# Patient Record
Sex: Female | Born: 1960
Health system: Southern US, Community
[De-identification: ages and names within clinical notes are randomized; demographics above are authoritative.]

## PROBLEM LIST (undated history)

## (undated) DIAGNOSIS — S82009A Unspecified fracture of unspecified patella, initial encounter for closed fracture: Secondary | ICD-10-CM

## (undated) DIAGNOSIS — S329XXA Fracture of unspecified parts of lumbosacral spine and pelvis, initial encounter for closed fracture: Secondary | ICD-10-CM

## (undated) DIAGNOSIS — E785 Hyperlipidemia, unspecified: Secondary | ICD-10-CM

## (undated) DIAGNOSIS — G4734 Idiopathic sleep related nonobstructive alveolar hypoventilation: Secondary | ICD-10-CM

## (undated) DIAGNOSIS — J449 Chronic obstructive pulmonary disease, unspecified: Secondary | ICD-10-CM

## (undated) DIAGNOSIS — I2510 Atherosclerotic heart disease of native coronary artery without angina pectoris: Secondary | ICD-10-CM

## (undated) DIAGNOSIS — R768 Other specified abnormal immunological findings in serum: Secondary | ICD-10-CM

## (undated) DIAGNOSIS — G8929 Other chronic pain: Secondary | ICD-10-CM

## (undated) DIAGNOSIS — R51 Headache: Secondary | ICD-10-CM

## (undated) DIAGNOSIS — E669 Obesity, unspecified: Secondary | ICD-10-CM

## (undated) DIAGNOSIS — K219 Gastro-esophageal reflux disease without esophagitis: Secondary | ICD-10-CM

## (undated) DIAGNOSIS — F1411 Cocaine abuse, in remission: Secondary | ICD-10-CM

## (undated) DIAGNOSIS — R519 Headache, unspecified: Secondary | ICD-10-CM

## (undated) DIAGNOSIS — K449 Diaphragmatic hernia without obstruction or gangrene: Secondary | ICD-10-CM

## (undated) DIAGNOSIS — J9610 Chronic respiratory failure, unspecified whether with hypoxia or hypercapnia: Secondary | ICD-10-CM

## (undated) DIAGNOSIS — F1910 Other psychoactive substance abuse, uncomplicated: Secondary | ICD-10-CM

## (undated) HISTORY — DX: Idiopathic sleep related nonobstructive alveolar hypoventilation: G47.34

## (undated) HISTORY — DX: Cocaine abuse, in remission: F14.11

## (undated) HISTORY — DX: Other specified abnormal immunological findings in serum: R76.8

## (undated) HISTORY — DX: Obesity, unspecified: E66.9

## (undated) HISTORY — DX: Atherosclerotic heart disease of native coronary artery without angina pectoris: I25.10

## (undated) HISTORY — DX: Other chronic pain: G89.29

## (undated) HISTORY — DX: Headache: R51

## (undated) HISTORY — DX: Headache, unspecified: R51.9

## (undated) HISTORY — DX: Hyperlipidemia, unspecified: E78.5

---
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
## 1997-10-10 ENCOUNTER — Other Ambulatory Visit: Admission: RE | Admit: 1997-10-10 | Discharge: 1997-10-10 | Payer: Self-pay | Admitting: Family Medicine

## 1997-12-08 ENCOUNTER — Other Ambulatory Visit: Admission: RE | Admit: 1997-12-08 | Discharge: 1997-12-08 | Payer: Self-pay | Admitting: *Deleted

## 1998-01-09 ENCOUNTER — Ambulatory Visit (HOSPITAL_COMMUNITY): Admission: RE | Admit: 1998-01-09 | Discharge: 1998-01-09 | Payer: Self-pay | Admitting: *Deleted

## 1998-01-13 ENCOUNTER — Ambulatory Visit (HOSPITAL_COMMUNITY): Admission: RE | Admit: 1998-01-13 | Discharge: 1998-01-13 | Payer: Self-pay | Admitting: *Deleted

## 1998-03-10 ENCOUNTER — Inpatient Hospital Stay (HOSPITAL_COMMUNITY): Admission: AD | Admit: 1998-03-10 | Discharge: 1998-03-12 | Payer: Self-pay | Admitting: *Deleted

## 1998-12-15 ENCOUNTER — Emergency Department (HOSPITAL_COMMUNITY): Admission: EM | Admit: 1998-12-15 | Discharge: 1998-12-15 | Payer: Self-pay | Admitting: Emergency Medicine

## 1998-12-15 ENCOUNTER — Encounter: Payer: Self-pay | Admitting: Emergency Medicine

## 1999-06-27 ENCOUNTER — Encounter: Payer: Self-pay | Admitting: Emergency Medicine

## 1999-06-27 ENCOUNTER — Emergency Department (HOSPITAL_COMMUNITY): Admission: EM | Admit: 1999-06-27 | Discharge: 1999-06-27 | Payer: Self-pay | Admitting: Emergency Medicine

## 1999-06-28 ENCOUNTER — Encounter: Payer: Self-pay | Admitting: Emergency Medicine

## 1999-08-30 ENCOUNTER — Inpatient Hospital Stay (HOSPITAL_COMMUNITY): Admission: EM | Admit: 1999-08-30 | Discharge: 1999-09-03 | Payer: Self-pay | Admitting: Emergency Medicine

## 1999-08-30 ENCOUNTER — Encounter: Payer: Self-pay | Admitting: Emergency Medicine

## 1999-09-01 ENCOUNTER — Encounter: Payer: Self-pay | Admitting: Family Medicine

## 2000-08-26 ENCOUNTER — Emergency Department (HOSPITAL_COMMUNITY): Admission: EM | Admit: 2000-08-26 | Discharge: 2000-08-26 | Payer: Self-pay

## 2001-04-04 ENCOUNTER — Ambulatory Visit (HOSPITAL_COMMUNITY): Admission: RE | Admit: 2001-04-04 | Discharge: 2001-04-04 | Payer: Self-pay | Admitting: Family Medicine

## 2001-04-04 ENCOUNTER — Encounter: Payer: Self-pay | Admitting: Family Medicine

## 2001-04-15 ENCOUNTER — Emergency Department (HOSPITAL_COMMUNITY): Admission: EM | Admit: 2001-04-15 | Discharge: 2001-04-15 | Payer: Self-pay

## 2003-11-16 ENCOUNTER — Emergency Department (HOSPITAL_COMMUNITY): Admission: EM | Admit: 2003-11-16 | Discharge: 2003-11-16 | Payer: Self-pay | Admitting: Emergency Medicine

## 2003-11-22 ENCOUNTER — Observation Stay (HOSPITAL_COMMUNITY): Admission: EM | Admit: 2003-11-22 | Discharge: 2003-11-23 | Payer: Self-pay | Admitting: Emergency Medicine

## 2004-01-18 ENCOUNTER — Emergency Department (HOSPITAL_COMMUNITY): Admission: EM | Admit: 2004-01-18 | Discharge: 2004-01-19 | Payer: Self-pay | Admitting: *Deleted

## 2005-12-01 ENCOUNTER — Inpatient Hospital Stay (HOSPITAL_COMMUNITY): Admission: EM | Admit: 2005-12-01 | Discharge: 2005-12-03 | Payer: Self-pay | Admitting: Emergency Medicine

## 2006-01-19 ENCOUNTER — Inpatient Hospital Stay (HOSPITAL_COMMUNITY): Admission: EM | Admit: 2006-01-19 | Discharge: 2006-01-22 | Payer: Self-pay | Admitting: Emergency Medicine

## 2006-05-29 ENCOUNTER — Inpatient Hospital Stay (HOSPITAL_COMMUNITY): Admission: EM | Admit: 2006-05-29 | Discharge: 2006-06-02 | Payer: Self-pay | Admitting: Emergency Medicine

## 2006-08-06 ENCOUNTER — Emergency Department (HOSPITAL_COMMUNITY): Admission: EM | Admit: 2006-08-06 | Discharge: 2006-08-06 | Payer: Self-pay | Admitting: Emergency Medicine

## 2006-08-08 ENCOUNTER — Inpatient Hospital Stay (HOSPITAL_COMMUNITY): Admission: EM | Admit: 2006-08-08 | Discharge: 2006-08-18 | Payer: Self-pay | Admitting: Emergency Medicine

## 2006-08-12 ENCOUNTER — Encounter (INDEPENDENT_AMBULATORY_CARE_PROVIDER_SITE_OTHER): Payer: Self-pay | Admitting: Interventional Cardiology

## 2006-08-18 ENCOUNTER — Encounter: Payer: Self-pay | Admitting: Cardiology

## 2006-08-18 ENCOUNTER — Ambulatory Visit: Payer: Self-pay | Admitting: Cardiology

## 2006-08-20 ENCOUNTER — Inpatient Hospital Stay (HOSPITAL_COMMUNITY): Admission: EM | Admit: 2006-08-20 | Discharge: 2006-08-23 | Payer: Self-pay | Admitting: Emergency Medicine

## 2006-08-20 ENCOUNTER — Ambulatory Visit: Payer: Self-pay | Admitting: Internal Medicine

## 2006-09-06 ENCOUNTER — Ambulatory Visit: Payer: Self-pay | Admitting: Internal Medicine

## 2006-09-11 ENCOUNTER — Ambulatory Visit: Payer: Self-pay

## 2007-06-13 ENCOUNTER — Ambulatory Visit: Payer: Self-pay | Admitting: Cardiology

## 2007-06-21 ENCOUNTER — Ambulatory Visit: Payer: Self-pay

## 2007-07-17 ENCOUNTER — Ambulatory Visit: Payer: Self-pay | Admitting: Internal Medicine

## 2008-03-31 ENCOUNTER — Encounter (HOSPITAL_BASED_OUTPATIENT_CLINIC_OR_DEPARTMENT_OTHER): Payer: Self-pay | Admitting: General Surgery

## 2008-03-31 ENCOUNTER — Ambulatory Visit (HOSPITAL_COMMUNITY): Admission: RE | Admit: 2008-03-31 | Discharge: 2008-03-31 | Payer: Self-pay | Admitting: General Surgery

## 2009-02-13 HISTORY — PX: HERNIA REPAIR: SHX51

## 2009-06-11 ENCOUNTER — Emergency Department (HOSPITAL_COMMUNITY): Admission: EM | Admit: 2009-06-11 | Discharge: 2009-06-11 | Payer: Self-pay | Admitting: Emergency Medicine

## 2009-06-15 ENCOUNTER — Inpatient Hospital Stay (HOSPITAL_COMMUNITY): Admission: EM | Admit: 2009-06-15 | Discharge: 2009-06-18 | Payer: Self-pay | Admitting: Emergency Medicine

## 2009-08-10 ENCOUNTER — Emergency Department (HOSPITAL_COMMUNITY): Admission: EM | Admit: 2009-08-10 | Discharge: 2009-08-10 | Payer: Self-pay | Admitting: Emergency Medicine

## 2009-08-13 ENCOUNTER — Emergency Department (HOSPITAL_COMMUNITY): Admission: EM | Admit: 2009-08-13 | Discharge: 2009-08-13 | Payer: Self-pay | Admitting: Emergency Medicine

## 2009-12-22 ENCOUNTER — Ambulatory Visit (HOSPITAL_COMMUNITY): Admission: RE | Admit: 2009-12-22 | Discharge: 2009-12-22 | Payer: Self-pay | Admitting: Family Medicine

## 2010-07-27 ENCOUNTER — Emergency Department (HOSPITAL_COMMUNITY)
Admission: EM | Admit: 2010-07-27 | Discharge: 2010-07-27 | Disposition: A | Discharge status: Home / Self Care | Payer: Medicare Other | Attending: Emergency Medicine | Admitting: Emergency Medicine

## 2010-07-27 ENCOUNTER — Emergency Department (HOSPITAL_COMMUNITY): Payer: Medicare Other

## 2010-07-27 DIAGNOSIS — J4489 Other specified chronic obstructive pulmonary disease: Secondary | ICD-10-CM | POA: Insufficient documentation

## 2010-07-27 DIAGNOSIS — R062 Wheezing: Secondary | ICD-10-CM | POA: Insufficient documentation

## 2010-07-27 DIAGNOSIS — R0602 Shortness of breath: Secondary | ICD-10-CM | POA: Insufficient documentation

## 2010-07-27 DIAGNOSIS — R109 Unspecified abdominal pain: Secondary | ICD-10-CM | POA: Insufficient documentation

## 2010-07-27 DIAGNOSIS — J449 Chronic obstructive pulmonary disease, unspecified: Secondary | ICD-10-CM | POA: Insufficient documentation

## 2010-07-27 DIAGNOSIS — R059 Cough, unspecified: Secondary | ICD-10-CM | POA: Insufficient documentation

## 2010-07-27 DIAGNOSIS — R05 Cough: Secondary | ICD-10-CM | POA: Insufficient documentation

## 2010-07-27 DIAGNOSIS — R0989 Other specified symptoms and signs involving the circulatory and respiratory systems: Secondary | ICD-10-CM | POA: Insufficient documentation

## 2010-07-27 DIAGNOSIS — R0609 Other forms of dyspnea: Secondary | ICD-10-CM | POA: Insufficient documentation

## 2010-07-27 DIAGNOSIS — K449 Diaphragmatic hernia without obstruction or gangrene: Secondary | ICD-10-CM | POA: Insufficient documentation

## 2010-07-27 LAB — COMPREHENSIVE METABOLIC PANEL
ALT: 14 U/L (ref 0–35)
AST: 19 U/L (ref 0–37)
CO2: 26 mEq/L (ref 19–32)
Chloride: 105 mEq/L (ref 96–112)
Creatinine, Ser: 0.65 mg/dL (ref 0.4–1.2)
GFR calc non Af Amer: >60 mL/min (ref >60)
Glucose, Bld: 123 mg/dL — ABNORMAL HIGH (ref 70–99)
Total Bilirubin: 0.4 mg/dL (ref 0.3–1.2)

## 2010-07-27 LAB — URINALYSIS, ROUTINE W REFLEX MICROSCOPIC
Bilirubin Urine: NEGATIVE
Ketones, ur: NEGATIVE mg/dL
Nitrite: NEGATIVE
Protein, ur: NEGATIVE mg/dL
pH: 5.5 (ref 5.0–8.0)

## 2010-07-27 LAB — POCT PREGNANCY, URINE

## 2010-07-27 LAB — DIFFERENTIAL
Basophils Absolute: 0 10*3/uL (ref 0.0–0.1)
Basophils Relative: 0 % (ref 0–1)
Eosinophils Absolute: 0.1 10*3/uL (ref 0.0–0.7)
Eosinophils Relative: 2 % (ref 0–5)
Monocytes Absolute: 0.6 10*3/uL (ref 0.1–1.0)
Monocytes Relative: 8 % (ref 3–12)

## 2010-07-27 LAB — CBC
Hemoglobin: 11.8 g/dL — ABNORMAL LOW (ref 12.0–15.0)
MCH: 28.2 pg (ref 26.0–34.0)
MCHC: 32.4 g/dL (ref 30.0–36.0)
Platelets: 266 10*3/uL (ref 150–400)
RDW: 13.8 % (ref 11.5–15.5)

## 2010-07-27 LAB — APTT

## 2010-07-27 MED ORDER — IOHEXOL 300 MG/ML  SOLN
100.0000 mL | Freq: Once | INTRAMUSCULAR | Status: AC | PRN
  Administered 2010-07-27: 100 mL via INTRAVENOUS

## 2010-08-01 LAB — BASIC METABOLIC PANEL
BUN: 7 mg/dL (ref 6–23)
Calcium: 8.5 mg/dL (ref 8.4–10.5)
GFR calc non Af Amer: >60 mL/min (ref >60)
Glucose, Bld: 109 mg/dL — ABNORMAL HIGH (ref 70–99)
Sodium: 139 mEq/L (ref 135–145)

## 2010-08-01 LAB — BLOOD GAS, ARTERIAL
Acid-base deficit: 0.9 mmol/L (ref 0.0–2.0)
Drawn by: 129801
FIO2: 0.21 %
pCO2 arterial: 43.8 mmHg (ref 35.0–45.0)
pH, Arterial: 7.359 (ref 7.350–7.400)

## 2010-08-01 LAB — DIFFERENTIAL
Basophils Absolute: 0 10*3/uL (ref 0.0–0.1)
Basophils Relative: 0 % (ref 0–1)
Neutro Abs: 2.8 10*3/uL (ref 1.7–7.7)
Neutrophils Relative %: 74 % (ref 43–77)

## 2010-08-01 LAB — CBC
MCHC: 33.9 g/dL (ref 30.0–36.0)
Platelets: 208 10*3/uL (ref 150–400)
RDW: 13.8 % (ref 11.5–15.5)

## 2010-08-02 LAB — POCT I-STAT 3, ART BLOOD GAS (G3+)
Acid-Base Excess: 4 mmol/L — ABNORMAL HIGH (ref 0.0–2.0)
Bicarbonate: 28.8 mEq/L — ABNORMAL HIGH (ref 20.0–24.0)
pCO2 arterial: 41.8 mmHg (ref 35.0–45.0)
pH, Arterial: 7.446 — ABNORMAL HIGH (ref 7.350–7.400)
pO2, Arterial: 115 mmHg — ABNORMAL HIGH (ref 80.0–100.0)

## 2010-08-02 LAB — CBC
Hemoglobin: 12.4 g/dL (ref 12.0–15.0)
RDW: 13.3 % (ref 11.5–15.5)

## 2010-08-02 LAB — DIFFERENTIAL
Basophils Absolute: 0 10*3/uL (ref 0.0–0.1)
Lymphocytes Relative: 15 % (ref 12–46)
Monocytes Absolute: 0.2 10*3/uL (ref 0.1–1.0)
Neutro Abs: 3.4 10*3/uL (ref 1.7–7.7)

## 2010-08-02 LAB — RAPID URINE DRUG SCREEN, HOSP PERFORMED
Barbiturates: NOT DETECTED
Cocaine: NOT DETECTED
Opiates: NOT DETECTED

## 2010-08-02 LAB — BASIC METABOLIC PANEL
BUN: 10 mg/dL (ref 6–23)
GFR calc non Af Amer: >60 mL/min (ref >60)
Potassium: 2.9 mEq/L — ABNORMAL LOW (ref 3.5–5.1)
Sodium: 139 mEq/L (ref 135–145)

## 2010-08-02 LAB — CREATININE, URINE, RANDOM: Creatinine, Urine: 174.9 mg/dL

## 2010-08-02 LAB — SODIUM, URINE, RANDOM: Sodium, Ur: 95 mEq/L

## 2010-08-06 LAB — MAGNESIUM: Magnesium: 2.3 mg/dL (ref 1.5–2.5)

## 2010-08-06 LAB — COMPREHENSIVE METABOLIC PANEL
ALT: 24 U/L (ref 0–35)
AST: 30 U/L (ref 0–37)
Albumin: 3.5 g/dL (ref 3.5–5.2)
Alkaline Phosphatase: 56 U/L (ref 39–117)
Chloride: 104 mEq/L (ref 96–112)
GFR calc Af Amer: >60 mL/min (ref >60)
Potassium: 4.5 mEq/L (ref 3.5–5.1)
Sodium: 140 mEq/L (ref 135–145)
Total Bilirubin: 0.2 mg/dL — ABNORMAL LOW (ref 0.3–1.2)
Total Protein: 6.6 g/dL (ref 6.0–8.3)

## 2010-08-06 LAB — URINALYSIS, MICROSCOPIC ONLY
Glucose, UA: NEGATIVE mg/dL
Ketones, ur: NEGATIVE mg/dL
Leukocytes, UA: NEGATIVE
Protein, ur: NEGATIVE mg/dL
pH: 6 (ref 5.0–8.0)

## 2010-08-06 LAB — CBC
HCT: 35.3 % — ABNORMAL LOW (ref 36.0–46.0)
Hemoglobin: 12.1 g/dL (ref 12.0–15.0)
MCV: 93.7 fL (ref 78.0–100.0)
Platelets: 227 10*3/uL (ref 150–400)
RBC: 3.78 MIL/uL — ABNORMAL LOW (ref 3.87–5.11)
RBC: 3.93 MIL/uL (ref 3.87–5.11)
WBC: 2.8 10*3/uL — ABNORMAL LOW (ref 4.0–10.5)
WBC: 9.4 10*3/uL (ref 4.0–10.5)

## 2010-08-06 LAB — HEMOGLOBIN A1C: Mean Plasma Glucose: 134 mg/dL

## 2010-08-06 LAB — GLUCOSE, CAPILLARY
Glucose-Capillary: 113 mg/dL — ABNORMAL HIGH (ref 70–99)
Glucose-Capillary: 118 mg/dL — ABNORMAL HIGH (ref 70–99)
Glucose-Capillary: 151 mg/dL — ABNORMAL HIGH (ref 70–99)
Glucose-Capillary: 154 mg/dL — ABNORMAL HIGH (ref 70–99)

## 2010-08-06 LAB — BASIC METABOLIC PANEL
CO2: 29 mEq/L (ref 19–32)
Calcium: 8.6 mg/dL (ref 8.4–10.5)
Creatinine, Ser: 0.74 mg/dL (ref 0.4–1.2)
GFR calc Af Amer: >60 mL/min (ref >60)
GFR calc non Af Amer: >60 mL/min (ref >60)
Sodium: 138 mEq/L (ref 135–145)

## 2010-08-06 LAB — CK: Total CK: 771 U/L — ABNORMAL HIGH (ref 7–177)

## 2010-08-06 LAB — HEPATIC FUNCTION PANEL
Albumin: 3.5 g/dL (ref 3.5–5.2)
Total Protein: 6.6 g/dL (ref 6.0–8.3)

## 2010-08-06 LAB — RAPID URINE DRUG SCREEN, HOSP PERFORMED
Amphetamines: NOT DETECTED
Barbiturates: NOT DETECTED
Benzodiazepines: NOT DETECTED
Opiates: NOT DETECTED

## 2010-08-06 LAB — CK TOTAL AND CKMB (NOT AT ARMC): CK, MB: 3.1 ng/mL (ref 0.3–4.0)

## 2010-08-06 LAB — CARDIAC PANEL(CRET KIN+CKTOT+MB+TROPI): Relative Index: 0.3 (ref 0.0–2.5)

## 2010-08-06 LAB — PHOSPHORUS: Phosphorus: 4.4 mg/dL (ref 2.3–4.6)

## 2010-08-09 LAB — CBC
HCT: 37.2 % (ref 36.0–46.0)
Hemoglobin: 12.7 g/dL (ref 12.0–15.0)
Platelets: 289 10*3/uL (ref 150–400)
RBC: 3.99 MIL/uL (ref 3.87–5.11)
WBC: 6.1 10*3/uL (ref 4.0–10.5)

## 2010-08-09 LAB — DIFFERENTIAL
Eosinophils Relative: 4 % (ref 0–5)
Lymphocytes Relative: 26 % (ref 12–46)
Lymphs Abs: 1.6 10*3/uL (ref 0.7–4.0)
Monocytes Relative: 6 % (ref 3–12)

## 2010-08-09 LAB — URINE MICROSCOPIC-ADD ON

## 2010-08-09 LAB — BASIC METABOLIC PANEL
Chloride: 108 mEq/L (ref 96–112)
GFR calc Af Amer: >60 mL/min (ref >60)
GFR calc non Af Amer: >60 mL/min (ref >60)
Potassium: 3.7 mEq/L (ref 3.5–5.1)
Sodium: 143 mEq/L (ref 135–145)

## 2010-08-09 LAB — POCT I-STAT, CHEM 8
BUN: 7 mg/dL (ref 6–23)
Calcium, Ion: 1.17 mmol/L (ref 1.12–1.32)
Chloride: 105 mEq/L (ref 96–112)
HCT: 41 % (ref 36.0–46.0)
Sodium: 140 mEq/L (ref 135–145)

## 2010-08-09 LAB — URINALYSIS, ROUTINE W REFLEX MICROSCOPIC
Glucose, UA: NEGATIVE mg/dL
Ketones, ur: NEGATIVE mg/dL
Nitrite: NEGATIVE
Protein, ur: NEGATIVE mg/dL
Protein, ur: NEGATIVE mg/dL
Specific Gravity, Urine: 1.019 (ref 1.005–1.030)
Urobilinogen, UA: 1 mg/dL (ref 0.0–1.0)
pH: 6 (ref 5.0–8.0)

## 2010-08-09 LAB — URINE CULTURE
Colony Count: 9000
Culture: NO GROWTH

## 2010-08-09 LAB — D-DIMER, QUANTITATIVE: D-Dimer, Quant: <0.22 ug/mL-FEU (ref 0.00–0.48)

## 2010-09-28 NOTE — Assessment & Plan Note (Signed)
[facility]                            CARDIOLOGY OFFICE NOTE   [name], [name]                      MRN:          [id number]  DATE:06/13/2007                            DOB:          [birth year]    This is a patient of Dr. Gala Romney.  This is a 50 year old African  American female who we had evaluated in the past for chest pain and  shortness of breath but she had refused the stress portion of a  Cardiolite scan.  When he went to reschedule it, she was put in jail for  breaking her probation.  She is now out of jail after spending 8 months  and is here complaining of ongoing shortness of breath and would like  the stress portion of her test reordered.  The patient says while in  jail she continued to have shortness of breath and they gave her three  different inhalers, which she says she is using every 2 hours and is  about to run out.  She denies any chest pain, palpitations, dizziness or  presyncope.  She gets out of breath with very little activity.  2-D echo  in April 2008 showed ejection fraction of 55-60%.  There was an atrial  septal aneurysm and right ventricular size was upper limits of normal  and RV systolic function was mildly reduced.  She does have a history of  cocaine and alcohol abuse as well as cigarette use.  She claims she is  not using any of these drugs and quit smoking.   CURRENT MEDICATIONS:  1. Albuterol and Atrovent inhalers 2 puffs t.i.d.  2. Aspirin 325 mg daily.   PHYSICAL EXAM:  This is a pleasant 50 year old African American female  in no acute distress.  Blood pressure 122/68, pulse 65, weight 169.  NECK:  Without JVD, HJR, bruit or thyroid enlargement.  LUNGS:  Decreased breath sounds, but they are clear anterior, posterior  and lateral.  HEART:  Regular rate and rhythm, 65 beats per minute, normal S1 and S2  with a 1/6 systolic ejection murmur at the apex.  ABDOMEN:  Soft without organomegaly, masses,  lesions or abnormal  tenderness.  EXTREMITIES:  Without cyanosis, clubbing or edema.  She has good distal  pulses.   EKG:  Normal sinus rhythm, nonspecific ST wave changes   IMPRESSION:  1. Dyspnea on exertion felt secondary to chronic obstructive pulmonary      disease, but will order stress portion of the Myoview that was      already done.  2. Chronic obstructive pulmonary disease.  3. Cocaine abuse.  4. Hypertension.  5. Tobacco abuse.  6. Alcohol abuse.  7. History of alcoholic gastritis.  8. Chronic iron-deficiency anemia.   PLAN:  At this time we have ordered the stress portion of the stress  test for this patient and she will follow up Dr. Gala Romney after that.  Please send a carbon copy to Dr. Billee Cashing, and I have asked her  to follow up with him concerning her refill of her inhalers.      [name], PA-C  Electronically Signed      [name]. Jens Som, MD, [facility]  Electronically Signed   ML/MedQ  DD: 06/13/2007  DT: 06/14/2007  Job #: [id number]   cc:   [name], M.D.

## 2010-09-28 NOTE — Op Note (Signed)
[name], [name]             ACCOUNT NO.:  [id number]   MEDICAL RECORD NO.:  [id number]          PATIENT TYPE:  AMB   LOCATION:  DAY                          FACILITY:  [facility]   PHYSICIAN:  Leonie Man, M.D.   DATE OF BIRTH:  12-Apr-1961   DATE OF PROCEDURE:  03/31/2008  DATE OF DISCHARGE:                               OPERATIVE REPORT   PREOPERATIVE DIAGNOSIS:  Incarcerated umbilical hernia   POSTOPERATIVE DIAGNOSIS:  Incarcerated umbilical hernia   PROCEDURE:  Repair of incarcerated umbilical hernia with Proceed mesh.   SURGEON:  Leonie Man, M.D.   ASSISTANT:  Dr. Estelle Grumbles.   ANESTHESIA:  General.   NOTE:  The patient is a 50 year old female with a painful incarcerated  umbilical hernia who referred for evaluation and treatment.  She comes  to the operating room now after the risks and potential benefits of  surgery have been discussed, all questions answered, consent obtained  for surgery.   PROCEDURE:  Following the induction of satisfactory general anesthesia,  the patient was positioned supinely and the abdomen is prepped and  draped to be included in a sterile operative field.  Preoperative  precautions identifying the patient as [name] procedure to be  done as umbilical herniorrhaphy carried out.  I then made an  infraumbilical circumferential incision through the skin and  subcutaneous tissues, dissecting the hernia sac down to the anterior  abdominal wall fascia and again raising the skin flap off of the  umbilical hernia sac down to the fascia.  The umbilical hernia sac is  opened and the incarcerated omentum and falciform ligament are divided  between clamps and secured with ties of 2-0 silk.  All adhesions to the  anterior abdominal wall around the hernia were cleared.  I then placed a  medium Proceed patch within the defect, suturing four sutures in each  corner and then suturing the tails of the mesh to the fascia.  This  having been  accomplished, the redundant tails were removed and the  fascia was closed with interrupted 0 Vicryl sutures.  Sponge and  instrument counts were doubly verified.  Subcutaneous tissues closed  with 3-0 Vicryl sutures.  Skin closed with running 4-0 Monocryl suture,  reinforced with Steri-Strips.  Sterile compressive dressing applied.  The anesthetic reversed and the patient removed from the operating room  to the recovery room in stable condition.  She tolerated the procedure  well.      Leonie Man, M.D.  Electronically Signed     PB/MEDQ  D:  03/31/2008  T:  04/01/2008  Job:  [id number]

## 2010-09-28 NOTE — Assessment & Plan Note (Signed)
[facility]                            CARDIOLOGY OFFICE NOTE   [name], [name]                      MRN:          [id number]  DATE:07/17/2007                            DOB:          04-28-1961    PRIMARY CARE PHYSICIAN:  Dr. Billee Cashing.   INTERVAL HISTORY:  [name] is a very pleasant 50 year old woman whom  evaluated recently for chest pain and shortness of breath.  She  underwent Myoview which showed ejection fraction of 58% with no ischemia  or infarct on her perfusion images.  She was found to have asthma.  She  is been treated with inhalers and is doing much better.  She denies any  recurrent chest pain or shortness of breath.  She quit smoking 8 months  ago.   CURRENT MEDICATIONS:  1. Atrovent 2 puffs 3 times a day.  2. Albuterol 2 puffs 3 times a day.  3. She also uses a Qvar inhaler.  4. Prilosec.   PHYSICAL EXAM:  She is in no acute distress.  Ambulates around the  clinic without respiratory difficulty.  Blood pressure is 115/82, heart rate 63, weights 172.  HEENT is normal.  Neck is supple.  No JVD.  Carotid 2+ bilaterally without bruits.  There  is no lymphadenopathy or thyromegaly.  CARDIAC:  PMI is nondisplaced.  She is regular rate and rhythm.  No  murmurs, rubs or gallops.  Lungs are clear with decreased breath sounds throughout.  No wheezing.  ABDOMEN:  Soft, nontender, nondistended.  No hepatosplenomegaly, no  bruits, no masses.  EXTREMITIES:  Warm with no cyanosis, clubbing or edema.  No rash.  NEURO:  Alert and oriented x3.  Cranial nerves II-XII are intact.  Moves  all four extremities without difficulty.  Affect is pleasant.   EKG shows normal sinus rhythm rate of 63 with no ST-T wave changes.   ASSESSMENT/PLAN:  1. Chest pain, shortness of breath.  I think this is mostly related to      her asthma.  Her Myoview scan has been totally negative.  She is      currently asymptomatic.  Will follow her up on  a p.r.n. basis.  I      did tell her she can stop her aspirin if she would like.     Tami Buckles. Bensimhon, MD  Electronically Signed   DRB/MedQ  DD: 07/17/2007  DT: 07/18/2007  Job #: [id number]   cc:   [name], M.D.

## 2010-10-01 NOTE — Assessment & Plan Note (Signed)
[place] [facility]                            CARDIOLOGY OFFICE NOTE   [name], [name]                      MRN:          [id number]  DATE:09/06/2006                            DOB:          April 05, 1961    This is a 50 year old African American female who was seen in consult in  the hospital for chest pain.  She was admitted with extensive left sided  strep pneumonia with associated bacteremia.  Hospitalization was also  complicated by apparent congestive heart failure.  She had an echo that  showed an ejection fraction of 60%, our view was mildly dilated with  some RV hypokinesis.  CT did not show any pulmonary embolism.  She had  signed AMA and was readmitted with several hour episode of chest pain  while watching television.  Her enzymes were negative, however EKG  showed prominent T-wave conversion v2 through V4 concerning for  ischemia.  It should be noted that the patient is a polysubstance abuser  with ongoing tobacco, alcohol and cocaine use and her levels were  positive for cocaine.  The patient has a history of non-compliance.  She  was scheduled for a stress Myoview, but refused the stress portion of  it.   The patient is here today for a post hospital follow up.  She says she  had an episode of chest pain that felt like indigestion last week while  smoking a cigarette.  She does have stomach problems with alcohol  induced gastritis and it is hard to discern what her chest pain is  coming from.  The patient admits to ongoing cocaine and cigarette abuse,  but says she has not had any alcohol recently.  She did cocaine over the  weekend.   CURRENT MEDICATIONS:  1. Aspirin 325 mg daily.  2. Multivitamin daily.  3. Avelox 400 mg nightly.   PHYSICAL EXAMINATION:  This is a thin anxious 50 year old [place]  American female in no acute distress.  Blood pressure 122/78, pulse 79, weight 121.  NECK:  Without jugular venous distension, HJR,  bruit or thyroid  enlargement.  LUNGS:  Decreased breath sounds, but clear, posteriorly a few scattered  crackles anteriorly.  HEART:  Regular rate and rhythm at 80 beats per minute, normal S1, S2,  positive S4 and a 1/6 systolic murmur at the apex.  ABDOMEN:  Soft without organomegaly, masses, lesions or abnormal  tenderness.  EXTREMITIES:  Without cyanosis, clubbing or edema, she has good distal  pulses.   IMPRESSION:  1. Chest pain with abnormal EKG with T-wave inversion and V2 through      V4.  Refuse stress portion of adenosine Myoview in the hospital.  2. Left sided pneumococcal pneumonia.  3. Cocaine abuse ongoing.  4. Hypertension.  5. Chronic obstructive pulmonary disease.  6. Tobacco abuse.  7. Alcohol abuse.  8. History of alcoholic gastritis.  9. Chronic iron deficiency anemia.  10.Chronic __________  anemia.  11.Scoliosis.  12.Chronic obstructive pulmonary disease.  13.History of malnutrition.   PLAN:  The patient says she will agree to the stress portion of the  stress  test.  She is non-compliant with medications and I have not  resumed any cardiac medications as she continues to abuse drugs and is  not going to take any medication at this time.  I have urged the patient  to quit cocaine and cigarette abuse and we have scheduled her for stress  Myoview and she will follow up with Dr. Bevelyn Buckles. Bensimhon within the  month.  She is to see Dr. Ronne Binning this week.      [name], PA-C  Electronically Signed      Tami Cranker, MD, [facility]  Electronically Signed   ML/MedQ  DD: 09/06/2006  DT: 09/06/2006  Job #: [id number]   cc:   Bevelyn Buckles. Bensimhon, MD  [name], M.D.

## 2010-10-01 NOTE — Consult Note (Signed)
[place]  Patient:    [name], [name]                      MRN: [id number] Proc. Date: 09/01/99 Adm. Date:  [id number] Attending:  Judie Petit Dictator:   [id number] CC:         [name], M.D.                          Consultation Report  REASON FOR CONSULTATION: [name] is a 50 year old female whom I am asked to see for guaiac positive stool and iron deficiency anemia.  HISTORY OF PRESENT ILLNESS: She was admitted on August 30, 1999 with asthma and pneumonia of the right lower lobe and probably also the left lower lobe, with small bilateral effusions.  Both sputum and blood cultures have grown out Streptococcus pneumoniae.  She is currently being treated with Levaquin and has improved significantly since admission.  In addition she has been receiving inhalation therapy.  Her admission hemoglobin was 12.4, which dropped to 9.4 with hydration. Fecal occult blood tests are positive.  Serum iron is less than 10.  B-12 and folate levels are normal.  The patient reports no significant abdominal pain or  weight loss, no vomiting, hematemesis, dysphagia, or heartburn.  She does admit to small amounts of bright red blood per rectum following bowel movements on occasion.  She has never had any radiographic or endoscopic studies of her gastrointestinal tract, and has no prior history of ulcers; however, she does use a fair amount f aspirin and ibuprofen as-needed.  She smokes approximately three or more cigarettes a day and drinks up to six quarts of beer a week, and she has a history of crack cocaine use.  PAST MEDICAL HISTORY: Pertinent for asthma and back disorder that has resulted n disability.  FAMILY HISTORY: Negative for known ulcers, gallstones, inflammatory bowel disease, or gastrointestinal neoplasia.  SOCIAL HISTORY: She is the mother of three, separated from the father of these children and living with a  boyfriend.  As noted, she drinks heavily, usually only beer.  She is a mild smoker and has a history of using crack cocaine.  REVIEW OF SYSTEMS: GENERAL: No weight loss or night sweats, although she recently had fevers with the pneumonia.  ENDOCRINE: It is noted in her chart that her TSH is very low, though she has no known history of hyperthyroidism.  SKIN: No rash or  pruritus.  EYES: No icterus or change in vision.  HEENT: Oropharynx shows no aphthous ulcers or chronic sore throat.  RESPIRATORY: Positive for asthma, shortness of breath, and cough which is productive at present.  CARDIAC: No history of angina or valvular heart disease.  GI: As above.  In addition, the patient reports a history of unknown hepatitis two years ago.  GU: No dysuria or hematuria.  The remainder of the Review Of Systems is negative.  PHYSICAL EXAMINATION:  GENERAL: She is a well-developed, well-nourished adult female, in no acute distress.  VITAL SIGNS: She is currently afebrile, with a blood pressure of 121/82, pulse f 75 and regular.  SKIN: Normal.  HEENT:  Eyes anicteric.  Conjunctivae pink.  Oropharynx unremarkable.  NECK: Supple without thyromegaly or adenopathy.  There is no inguinal adenopathy.  CHEST: Sounds fairly clear on the left side.  There are decreased breath sounds and rales at the right base.  There is no egophony.  CARDIAC:  Heart sounds regular rate and rhythm without murmurs.  ABDOMEN:  Soft and nontender, without mass or organomegaly.  RECTAL: Not performed.  EXTREMITIES: Without edema, dorsalis pedis pulses 2+ bilaterally.  LABORATORY DATA: Potassium 3.2, serum sodium 132.  BUN 11, creatinine 0.7. Hemoglobin 9.4, platelet count 155,000, WBC 3.4.  Serum iron less than 10. B-12 level is 570, folate is 363.  IMPRESSION: This patient is a 50 year old female without significant symptoms of gastrointestinal tract disease other than occasional bright red blood  per rectum after bowel movements.  She has a significant anemia and is iron deficient. This could be related to gastrointestinal blood loss or menstruation.  In light of her alcohol abuse and history of aspirin use, and her age, significant peptic or neoplastic lesion should be ruled out.  I do think at this time that her respiratory status would not contraindicate endoscopic procedures and thus I have suggested to the patient both upper and lower endoscopy to rule out significant  problems.  PLAN: Procedures were reviewed in terms of technique, preparation, and risk of complications including bleeding and perforation, and she agrees to proceed. This will be scheduled for tomorrow afternoon, September 02, 1999.  Clear liquid diet and Phospho-Soda prep will be administered.  Please see the orders. DD:  09/01/99 TD:  09/01/99 Job: [id number]

## 2010-10-01 NOTE — Discharge Summary (Signed)
[name], [name]             ACCOUNT NO.:  [id number]   MEDICAL RECORD NO.:  [id number]          PATIENT TYPE:  INP   LOCATION:  3735                         FACILITY:  [facility]   PHYSICIAN:  Hillery Aldo, M.D.   DATE OF BIRTH:  02-19-1961   DATE OF ADMISSION:  12/01/2005  DATE OF DISCHARGE:  12/03/2005                                 DISCHARGE SUMMARY   PRIMARY CARE PHYSICIAN:  [name], M.D.   DISCHARGE DIAGNOSES:  1.  Acute chronic obstructive pulmonary disease and asthma exacerbation.  2.  Ongoing cocaine abuse.  3.  Ongoing tobacco abuse.  4.  Hypokalemia.  5.  Mild thoracolumbar scoliosis.  6.  Normocytic anemia.  7.  Steroid-induced hyperglycemia.   DISCHARGE MEDICATIONS:  1.  Avelox 400 mg daily x4 additional days.  2.  Prednisone taper 60 mg to off over six days.  3.  Albuterol meter dose inhaler two puffs q.2 h. P.r.n.   CONSULTATIONS:  None.   BRIEF ADMISSION HPI:  The patient is a 50 year old female who presents with  increased shortness of breath accompanied by increased wheezing and a cough  productive of yellow and green sputum.  She was admitted for stabilization  and treatment.   PROCEDURES AND DIAGNOSTIC STUDIES:  Chest x-ray on December 02, 2005 showed no  active cardiopulmonary disease.  There was mild thoracolumbar scoliosis.   DISCHARGE LABORATORY VALUES:  CBC showed a white blood cell count of 11,  hemoglobin 10.4, hematocrit 31.8 and platelet count 354.  BMET showed a  sodium of 240, potassium 4.3, chloride 103, bicarb 32, BUN 9, creatinine 0.7  and glucose 133.   HOSPITAL COURSE BY PROBLEM:  1.  Acute asthma and chronic obstructive pulmonary disease exacerbation.      The patient likely  has had an exacerbation related to crack cocaine      use.  She was treated conservatively with steroids and empiric      antibiotics given her productive cough and elevated white blood cell      count.  We will continue her antibiotics for a full  week course. She has      improved and feels ready to go home. Her lungs are clear without wheezes      but diminished at the bases.  2.  Polysubstance abuse.  The patient did have a urine drug screen done on      admission which showed positive opiates and cocaine.  Social work was      consulted with a referral for [facility] completed and the patient should      follow up as an outpatient.  3.  Hypokalemia.  The patient did have transient hypokalemia.  This      corrected on its own without any additional supplementation.  4.  Steroid-induced hyperglycemia.  The patient was started on sliding scale      insulin.  Her glycemic control should normalize with steroid taper.  5.  Normocytic anemia.  While the patient admits that she gets heavy      periods, her anemia is likely related to menstrual losses.  She should      follow up with her primary care physician for ongoing surveillance of      this issue.  6.  Tobacco abuse.  The patient was  given a nicotine patch and counseled on      the importance of cessation given her underlying lung disease.   DISPOSITION:  The patient is stable for discharge home.  She should follow  up with Dr. Ronne Binning early next week.  She should follow up with [facility] for  ongoing substance abuse counseling.  She is instructed to return to the  emergency department if she experiences any further problems with  respiratory distress.      Hillery Aldo, M.D.  Electronically Signed     CR/MEDQ  D:  12/03/2005  T:  12/03/2005  Job:  [id number]   cc:   [name], M.D.

## 2010-10-01 NOTE — H&P (Signed)
[name] NO.:  [id number]   MEDICAL RECORD NO.:  [id number]          PATIENT TYPE:  EMS   LOCATION:  MAJO                         FACILITY:  [facility]   PHYSICIAN:  [name], M.D. DATE OF BIRTH:  12-Aug-1960   DATE OF ADMISSION:  08/08/2006  DATE OF DISCHARGE:                              HISTORY & PHYSICAL   PRIMARY CARE PHYSICIAN:  [name], M.D. and therefore she is  unassigned.   CHIEF COMPLAINT:  Abdominal discomfort.   HISTORY OF PRESENT ILLNESS:  [name] is a 50 year old female  who is accompanied by her mother.  The patient is awake and coherent,  however, is somewhat distracted by her abdominal discomfort.  Therefore,  it is very difficult at times to get a good history from the patient.  Her mother provides additional information.  According to the patient,  she has been feeling ill for at least 2 weeks.  She went to see her  primary care doctor Thursday or Friday of last week, today is Tuesday  August 08, 2006.  She states that her primary care doctor told her that  she had the flu.  She was provided with a Z-Pak, Vicodin, and Tussionex.  She took the medications, however, her symptoms did not improve.  She  came to the emergency department 2 days later, August 06, 2006.  At that  time the ER doctor provided her with a prescription for Avelox.  She  states that neither the Z-Pak nor the Avelox helped to alleviate her  symptoms.  She woke up this morning and states that her belly button  felt as though it was sticking out.  Her mother was at the bedside and  states that the patient had been up all night coughing.  The patient  herself states that she has been coughing up a large amount of greenish  phlegm.  Her mother states there have been some slight streaks of blood  present with the patient's phlegm.  The patient denies currently having  a fever, but states that last week her temperature went up as high as  103.  She does complain of nausea and emesis, having an episode of  emesis last night.  There have been no sick contacts at home.  She also  has had some slight shortness of breath.   PAST MEDICAL HISTORY:  1. COPD.  2. Alcohol withdrawal.  3. Substance abuse.  4. Mild scoliosis.  5. Normocytic anemia.  6. Steroid induced hyperglycemia.  7. Alcoholic gastritis.  8. Hyperkalemia.  9. History of asthma.  10.Iron deficiency anemia.  11.Pneumonia.   ALLERGIES:  No known drug allergies.   CURRENT MEDICATIONS:  Albuterol inhaler.   SOCIAL HISTORY:  The patient has a history of smoking one pack of  cigarettes per day.  She has been smoking for at least 20 years.  She  also has a history of cocaine use and alcohol abuse.   FAMILY HISTORY:  Mother has a history of asthma and rheumatoid  arthritis.  Father has a history of heart problems and was asthmatic.   REVIEW  OF SYSTEMS:  As per HPI, otherwise, all other systems are  negative.   PHYSICAL EXAMINATION:  GENERAL:  The patient is awake.  She is  cooperative, but at times appears to be slightly agitated and  uncomfortable.  There is some slight use of additional accessory muscles  to breathe.  She speaks in full sentences, but appears to be slightly  tachypneic.  She appears also to be somewhat cachectic in her overall  physical appearance.  VITAL SIGNS:  Temperature was 98.0, blood pressure 124/82, heart rate  100, respirations 22, O2 saturation 98% on room air.  HEENT:  Normocephalic and atraumatic.  Anicteric.  Decreased reactivity  to light bilaterally.  Oral mucosa is pink.  All of the patient's upper  teeth are missing.  No thrush or exudates.  NECK:  Supple with no lymphadenopathy.  Thyroid is not palpable.  HEART:  S1 and S2 present.  LUNGS:  Decreased breath sounds bilaterally.  ABDOMEN:  Distended, soft.  There is some slight discomfort to palpation  in all four quadrants.  Bowel sounds are present.  No masses are   palpated.  EXTREMITIES:  No leg edema.  NEUROLOGY:  The patient is alert and oriented x3.  MUSCULOSKELETAL:  5/5 upper and lower extremity strength.   LABORATORY DATA:  ABG; pH 7.430, pCO2 47.1, bicarb 31.3.  White blood  cell count 18.9, hemoglobin 10.7, hematocrit 31.3, platelets 412.  Sodium 127, potassium 3.5, chloride 93, BUN 11, creatinine 0.6, glucose  104, alkaline phosphatase 120, SGOT 38, SGPT 31, total protein 5.9,  albumin 2.3, calcium 8.4, lipase 13.   Chest x-ray reveals extensive left lung pneumonia, stable changes  consistent with COPD.   Abdominal x-ray reveals no acute findings.   ASSESSMENT:  1. Extensive left lung pneumonia.  The patient has failed outpatient      therapy with p.o. Avelox and Azithromycin.  We will start the      patient on aggressive IV antibiotics.  We will obtain a sputum      sample and send for gram stain, culture, and sensitivity as well as      PCP and Legionella.  We will also check blood cultures x2 and      verify the patient's HIV status.  2. History of alcohol abuse.  The patient currently shows no signs of      withdrawal.  We will monitor closely for now.  We will provide      thiamine, folic acid, and multivitamin.  3. Abdominal pain.  The etiology of this is questionable.  The patient      has already had abdominal x-rays completed and they demonstrate no      acute findings.  Her current symptomes could be secondary to      gastritis as well as peptic ulcer disease.  The fact that the      patient has been coughing throughout the course of the night may      have also caused some irritation to her abdomen.  Since the      patient's abdominal x-rays were negative, we will consider ordering      a CAT scan of the patient's abdomen.  4. Leukocytosis.  This is more likely secondary to the patient's      pneumonia.  We will treat as already outlined. 5. Hyponatremia.  The etiology of this is questionable.  Again the      patient  does appear to be cachectic in appearance.  Therefore a      decreased p.o. intake of sodium may have contributed to this.  We      will start the patient on 0.9 normal saline for now and monitor her      levels closely.  6. Hypoalbuminemia.  This may be secondary to protein calorie      malnutrition.  We will check a prealbumin level.      May also consider consultation with nutrition.  7. Gastrointestinal prophylaxis.  We will provide Protonix.  8. Deep venous thrombosis prophylaxis.  We will provide Lovenox.      [name], M.D.  Electronically Signed     OR/MEDQ  D:  08/08/2006  T:  08/08/2006  Job:  [id number]   cc:   [name], M.D.

## 2010-10-01 NOTE — H&P (Signed)
[name], [name]             ACCOUNT NO.:  [id number]   MEDICAL RECORD NO.:  [id number]          PATIENT TYPE:  INP   LOCATION:  1830                         FACILITY:  [facility]   PHYSICIAN:  Hillery Aldo, M.D.   DATE OF BIRTH:  February 25, 1961   DATE OF ADMISSION:  01/19/2006  DATE OF DISCHARGE:                                HISTORY & PHYSICAL   PRIMARY CARE PHYSICIAN:  [name], M.D.   CHIEF COMPLAINT:  Wheezing, dyspnea.   HISTORY OF PRESENT ILLNESS:  Patient is a 50 year old female with a past  medical history of COPD, who presents with a three day history of increasing  cough productive of yellow sputum.  She denies any fever but has had some  intermittent chills.  She does describe chest congestion and hoarseness.  Denies any sick contacts.  She does have a history of cocaine abuse and last  smoked cocaine approximately two days ago.  We are admitting her for further  evaluation, workup, and treatment for her acute COPD exacerbation.   PAST MEDICAL HISTORY:  1. COPD with hospitalizations for acute exacerbation in the past.  2. Polysubstance abuse.  3. Ongoing tobacco abuse.  4. Mild scoliosis.  5. History of normocytic anemia.  6. Steroid-induced hyperglycemia.  7. History of microcytic anemia, status post GI workup, including upper      and lower endoscopy with the only positive finding being gastritis.   FAMILY HISTORY:  The patient's mother has asthma and rheumatoid arthritis.  Father died at age 85 with complications of an acute MI.  He was also  asthmatic.   SOCIAL HISTORY:  Patient is single.  She has a 10-pack-year history of  tobacco abuse.  She does continue to actively use cocaine.  Drinks alcohol  socially.  She has three healthy children.   ALLERGIES:  None.   CURRENT MEDICATIONS:  Albuterol metered dose inhaler p.r.n.   REVIEW OF SYSTEMS:  Negative except for the elements as noted in the HPI  above.   PHYSICAL EXAMINATION:  VITAL SIGNS:   Temperature 98, pulse 78, respirations  20, blood pressure 103/61.  GENERAL:  A well-developed, well-nourished thin female in no acute distress.  HEENT:  Normocephalic and atraumatic.  PERRLA.  EOMI.  Oropharynx is clear.  NECK:  Supple.  No thyromegaly.  No lymphadenopathy.  No jugular venous  distention.  CHEST:  There are diffuse expiratory wheezes in all lung fields.  HEART:  Tachycardic rate.  Regular rhythm.  No murmurs, rubs or gallops.  ABDOMEN:  Soft, nontender, nondistended with normoactive bowel sounds.  EXTREMITIES:  No clubbing, cyanosis or edema.  SKIN:  Warm and dry.  No rashes.  NEUROLOGIC:  Alert and oriented x3.  Cranial nerves II-XII are grossly  intact.  Nonfocal.  PSYCH:  Patient appears to have a depressed affect and is somewhat  tremulous.   DATA REVIEW:  Chest x-ray shows changes of COPD but no active  cardiopulmonary disease.   EKG shows tachycardia with normal sinus rhythm.   LABORATORY DATA:  Sodium is 138, potassium 4.2, chloride 108, bicarb 28, BUN  7, creatinine  0.8, glucose 65.  White blood cell count is 4.5, hemoglobin  12.8, hematocrit 38, platelets 354, absolute neutrophil count 2.   ASSESSMENT/PLAN:  1. Chronic obstructive pulmonary disease with acute exacerbation, likely      triggered by cocaine abuse.  We will admit the patient and administer      high dose Solu-Medrol, empiric IV antibiotics with Avelox, frequent      bronchodilator by nebulized aerosols, and antitussive/mucolytic's.      Will attempt to taper steroids rapidly.  Given her recurrent cocaine      abuse, would get Social Work consult for alcohol and drug counseling,      as this will likely recur again with ongoing use.  Additionally, she      continues to abuse tobacco.  We will get formal counseling for tobacco      abuse as well.  2. Polysubstance abuse:  Again, we will get cessation counseling.  We will      use Ativan p.r.n. for agitation.  3. Prophylaxis:  We will use PAS  hose for DVT prophylaxis, as the patient      is fairly ambulatory.  Will put the patient on p.o. Protonix for GI      prophylaxis.      Hillery Aldo, M.D.  Electronically Signed     CR/MEDQ  D:  01/19/2006  T:  01/19/2006  Job:  [id number]   cc:   [name], M.D.

## 2010-10-01 NOTE — Procedures (Signed)
[place]  Patient:    [name], [name]                      MRN: [id number] Proc. Date: 09/02/99 Adm. Date:  [id number] Attending:  Judie Petit Dictator:   Roosvelt Harps, M.D. CC:         [name], M.D.                           Procedure Report  PROCEDURES: 1. Video upper endoscopy. 2. Video colonoscopy.  INDICATIONS:  The patient is a 50 year old female with microcytic guaiac positive anemia.  PREPARATION:  She is n.p.o. since midnight, having taken Phospho-Soda prep and clear liquid diet.  Mucosa throughout is clean.  PRE-PROCEDURE SEDATION: 1. Prior to the upper endoscopy she received 3.75 mg of Droperidol, 5 mg of    Versed, and 40 mg of Demerol intravenously. 2. In addition, her throat was anesthetized with Hurricane spray, and she was    on 2 L of nasal cannula O2.  DESCRIPTION OF PROCEDURE:  The Olympus video upper endoscope was inserted via the mouth and advanced easily through the upper esophageal sphincter. Intubation was then carried out well into the descending duodenum.  Descending duodenum and bulb appeared normal.  Within the antrum of the stomach were a few small erosions which were not bleeding.  The remainder of the stomach, retroflex view of the GE junction were normal.  The esophagus was entirely normal.  There were no signs of varices.  The patient tolerated the procedure well.  At its conclusion her position was reversed for procedure #2, video colonoscopy.  She received an additional 2 mg of Versed intravenously.  The Olympus video colonoscope was inserted via the rectum and advanced quite easily all the way to the cecum.  The cecal landmarks were identified and photographed.  On withdrawal the mucosa was carefully evaluated and found to be entirely normal from cecum to retroflexed view of the rectum.  There were no signs of any neoplasia inflammation, diverticulosis, or active  bleeding. She did have grade II internal hemorrhoids.  She tolerated the procedure well. Pulse, blood pressure, and oximetry testing were stable throughout.  She was observed in recovery for 45 minutes, and discharged back to her hospital room alert with a benign abdomen.  IMPRESSION: 1. Gastric antral erosions, CLOtest pending. 2. Internal hemorrhoids, otherwise normal colonoscopy.  RECOMMENDATIONS: 1. CLOtest should be checked and treated if appropriate, if it is positive. 2. From a gastrointestinal standpoint, the patient may be discharged for    outpatient follow up.  Her iron-deficiency anemia is likely secondary to    menstrual blood losses, and other dietary factors and lifestyle. DD:  09/02/99 TD:  09/03/99 Job: [id number]

## 2010-10-01 NOTE — Consult Note (Signed)
[name], [name]             ACCOUNT NO.:  [id number]   MEDICAL RECORD NO.:  [id number]          PATIENT TYPE:  INP   LOCATION:  3735                         FACILITY:  [facility]   PHYSICIAN:  Bevelyn Buckles. Bensimhon, MDDATE OF BIRTH:  December 10, 1960   DATE OF CONSULTATION:  08/22/2006  DATE OF DISCHARGE:                                 CONSULTATION   CARDIOLOGY CONSULTATION NOTE   REQUESTING PHYSICIAN:  Lonia Blood, M.D.   REASON FOR CONSULTATION:  Chest pain, EKG  changes, protein-calorie  malnutrition.   HISTORY OF PRESENT ILLNESS:  [name] is a 50 year old woman with  multiple medical problems; including polysubstance abuse with tobacco,  alcohol, cocaine.  She also has a history of malnutrition and reported  cirrhosis; as well as they asthma.  She was recently admitted from August 08, 2006 to August 18, 2006 for extensive a left-sided strep pneumonia  with associated bacteremia.  The hospitalization was also complicated by  apparent congestive heart failure.  She had an echocardiogram at that  time, which showed an ejection fraction of 60%.  The R-view was mildly  dilated with some RV hypokinesis.  She underwent a CT scan, which did  not show any pulmonary embolus.  She unfortunately signed out [facility] on  April 4.  She was readmitted on April 6 with a several hour episode.  Chest pain, which occurred while watching TV.  She said the pain was  sharp and underneath her right breast.  It lasted approximately 2 hours  and then stopped, after coming to the ER.  She had wheezing and coughing  with some yellow sputum.  She does say that nitroglycerin did help her.  Urine drug screen was positive for cocaine.   Since admission, she has had several sets of cardiac markers, which have  been negative with a troponin of 0.03 and 0.04; however, EKG showed  prominent T-wave inversions in V2 through V4 concerning for ischemia.  In going back to her old EKG's these were similar to her  previous EKGs  in April; and just mildly increased since her EKGs back in July 2007.  She is currently pain free.   REVIEW OF SYSTEMS:  Notable for chest pain, shortness of breath,  dyspnea, coughing, and wheezing as well as headaches.  Also for anxiety  and abdominal pain.  Denies any fevers or chills.  No bright red blood  per rectum, no melanotic, no palpitations.  The remainder of the review  of systems is negative except for HPI and problem list.   PAST MEDICAL HISTORY:  1. Polysubstance abuse with ongoing tobacco, alcohol, and cocaine use.  2. Admission for a strep pneumonia in March of 2008, as per HPI.  3. Asthma.  4. Questionable history of cirrhosis.  5. Anemia.  6. Medical noncompliance.   CURRENT MEDICATIONS:  1. Lovenox.  2. Aspirin 325 mg a day.  3. Protonix 40.  4. Albuterol.  5. Ditropan.  6. Robitussin.  7. Avelox.  8. Lasix 40 mg IV daily.  9. Theo-Dur.  10.Iron sulfate.  11.Librium.   ALLERGIES:  No known drug allergies.  SOCIAL HISTORY:  She lives in [place] with her children and her  mother.  History of tobacco use, greater than 1 pack per day for 20  years.  Also has a history of alcohol abuse, but said she stopped  several days ago.  She denies cocaine, though her urine drug screen was  positive.   FAMILY HISTORY:  Mother has asthma and rheumatoid arthritis.  Father has  coronary artery disease and asthma.   PHYSICAL EXAMINATION:  GENERAL:  She is sitting in bed in no acute  distress.  She is a little bit jittery.  VITAL SIGNS:  Blood pressure 91/59 with a pulse of 83.  She is  saturating 96% on room air.  Her respiratory effort is unlabored.  HEENT:  Sclerae anicteric.  EOMI.  There is no xanthelasma.  There is  mild temporal wasting; and some alopecia.  Oropharynx is clear.  NECK:  Supple.  There is no JVD, carotids are 2+ bilaterally without any  bruits.  There is no lymphadenopathy or thyromegaly.  CARDIAC:  She is mildly tachycardic,  but irregular with a 2/6 systolic  ejection murmur at the left sternal border.  There is no rub or gallop.  LUNGS:  Clear with mildly diminished breath sounds at the bases, left  greater than right.  ABDOMEN:  Soft, nontender, nondistended.  No hepatosplenomegaly, no  bruits, no masses, good bowel sounds.  EXTREMITIES:  Warm with no cyanosis clubbing or edema.  No rashes.  NEUROLOGIC:  Alert and oriented x3.  Cranial nerves II-XII are intact.  Moves all four extremities without difficulty.  Affect is mildly  pressured.   EKG:  Shows sinus rhythm at a rate of 80 with T-wave inversions in V-2  through V-4.  This is unchanged from previous.   LABS:  Troponin is 0.03 and 0.04.  Urine drug screen is positive for  cocaine.  White count 4.2, hemoglobin is 10.9, platelets are 578.  Sodium 135, potassium 4.2, creatinine 0.8, BUN 12, glucose is 82.   CHEST X-RAY:  Shows increasing left-sided pleural effusion with  worsening left-sided aeration, question infectious versus a symmetric  pulmonary edema.   ASSESSMENT AND PLAN:  [name] chest pain has both 50 typical and  atypical features; however, in the setting of cocaine use and multiple  other cardiac risk factors, we do have to worry about underlying  coronary artery disease.  Fortunately, she has ruled out for myocardial  infarction with serial cardiac markers.  Her EKG is impressive, but  relatively unchanged from previous.  I did discuss with her in her  mother.  The options of cardiac catheterization versus stress testing;  and she adamantly refuses catheterization at this time.  She does,  however, agreed to a Myoview; and, thus, we will proceed with an  adenosine Myoview in the morning.  She also needs aggressive risk factor  modification though I suspect that her compliance will be poor.  As a  side note, given her RV dilatation a pulmonary embolus is certainly a possibility, but in review of her CAT angiogram from March 29 there  was  no evidence of pulmonary embolus; and this appears stable.   Finally, given her recent strep pneumonia; I wonder whether her left-  sided lung process on x-ray may be infectious and is somewhat concerning  for empyema though she is currently afebrile.  I would consider  repeating a chest CT with noncontrast to further evaluate.  I will leave  this to the  discretion of the primary team.  We appreciate the consult,  and will follow with you.      Bevelyn Buckles. Bensimhon, MD  Electronically Signed     DRB/MEDQ  D:  08/22/2006  T:  08/22/2006  Job:  [id number]   cc:   Lonia Blood, M.D.

## 2010-10-01 NOTE — Discharge Summary (Signed)
[name], [name]             ACCOUNT NO.:  [id number]   MEDICAL RECORD NO.:  [id number]          PATIENT TYPE:  INP   LOCATION:  3735                         FACILITY:  [facility]   PHYSICIAN:  Lonia Blood, M.D.DATE OF BIRTH:  June 26, 1960   DATE OF ADMISSION:  08/20/2006  DATE OF DISCHARGE:  08/23/2006                               DISCHARGE SUMMARY   PRIORITY DISCHARGE SUMMARY   PRIMARY CARE PHYSICIAN:  [name], MD.   DISCHARGE DIAGNOSES:  1. Left-sided complicated Pneumococcal pneumonia.      a.     Previous admission at the end of March from which the       patient left against medical advice.      b.     Computerized tomography scans on readmission ruled out       empyema.      c.     Radiographic evaluations reveal persistent left lingular and       left lower lobe infiltrate.  2. Chest pain felt to be secondary to above.      a.     Cardiology consultation.      b.     Myoview scheduled, but patient refused to complete test.      c.     Resting images on Myoview unrevealing.  3. Cocaine abuse.  4. Hypertension.  5. Chronic obstructive pulmonary disease.  6. Tobacco abuse.  7. Scoliosis.  8. Chronic normocytic anemia.  9. History of alcoholic gastritis.  10.Chronic iron deficiency anemia.   DISCHARGE MEDICATIONS:  1. Aspirin 325 mg a day.  2. Multivitamin a day.  3. Avelox 400 mg a day for 10 days, then stop.   FOLLOWUP:  The patient is advised to follow up with Dr. Billee Cashing  in five to seven days for routine medical recheck.   CONSULTATIONS:  [facility].   PROCEDURES:  CT scan of the chest - no evidence of empyema.   HOSPITAL COURSE:  [name] is a 50 year old female, who is  well known to our service.  She was admitted near the end of March for a  severe, left lung, community-acquired Pneumococcal pneumonia.  This  required inpatient IV antibiotics.  Near the second portion of her  hospital stay, the patient left  [facility].  She went out, abused cocaine, and  presented back the day of this admission with complaints of chest pain  and difficulty breathing.  Reevaluation revealed a persistent and  worsened left lung pneumonia.  The patient was treated with intravenous  antibiotics and began to improve.  Because of complaints of chest pain  and some T-wave inversions, decision was made to pursue cardiac workup.  [facility] was consulted.  The [facility] group felt  that the patient's EKG changes were persistent and present on old EKGs.  Nonetheless, decision was made to pursue a Myoview.  The patient went  down for her resting images.  She then became dissatisfied with the fact  that she had to be NPO for her stress images and refused to do the  followup images and returned to  her room and began to eat.  After  counseling, she continued to refuse to allow the test to be completed.  What resting images were able to be obtained, however, did not reveal  evidence of a scar.  As the patient was otherwise medically stable and  was refusing further workup, decision was made that she was safe for  discharge.  In that she has Medicaid, she has a guaranteed way to  receive her medications.  She is sent home on the above-listed medical  regimen with stable vital signs and in an afebrile state.      Lonia Blood, M.D.  Electronically Signed     JTM/MEDQ  D:  08/23/2006  T:  08/23/2006  Job:  [id number]   cc:   [name], M.D.

## 2010-10-01 NOTE — Discharge Summary (Signed)
NAMESHARICE, [name]             ACCOUNT NO.:  [id number]   MEDICAL RECORD NO.:  [id number]          PATIENT TYPE:  INP   LOCATION:  5155                         FACILITY:  [facility]   PHYSICIAN:  Mobolaji B. Bakare, M.D.DATE OF BIRTH:  [birth year]   DATE OF ADMISSION:  01/19/2006  DATE OF DISCHARGE:  01/22/2006                                 DISCHARGE SUMMARY   PRIMARY CARE PHYSICIAN:  [name], M.D.   FINAL DIAGNOSES:  1. Chronic obstructive pulmonary disease exacerbation.  2. Alcohol withdrawal symptoms.  3. Polysubstance abuse.  4. Ongoing tobacco abuse.   PROCEDURE:  Chest x-ray showed no acute findings, evidence of COPD.   BRIEF HISTORY:  [name] is a 50 year old African-American female with  known history of polysubstance abuse, alcohol abuse and COPD.  She presented  to the emergency room with wheezing and shortness of breath.  She did not  respond to initial sets of nebulization in the emergency room and she was  admitted for that treatment with IV Solu-Medrol, antibiotics and nebs.  She  apparently has been using cocaine and she last used cocaine two days prior  to hospitalization.   Initial vitals on admission:  Temperature 98, pulse of 78, respiratory rate  of 20, blood pressure of 103/61.  Lungs showed short significant wheezing.  She was subsequently admitted for further treatment.   HOSPITAL COURSE:  [name] was started on IV Solu-Medrol, Avelox and  frequent nebulizations.  She responded to treatment within 48 hours.  Patient was consulted on tobacco abuse and polysubstance abuse.  She has  agreed to follow up with inpatient rehabilitation.  She plans to attend  [facility] for 30-day inpatient rehab program, she appears to be excited about  this and patient will be making necessary contact on discharge.   She was placed on alcohol withdrawal protocol.  She did not have significant  symptoms other than jitteriness and shaking, these had subsided at  the time  of discharge.  She was discharged on tapered dose of Ativan.   DISCHARGE CONDITION:  Stable.   DISCHARGE MEDICATIONS:  1. Combivent two puffs q.i.d. and q.2 p.r.n.  2. Nasonex 600 mg b.i.d. for one week.  3. Multivitamin one daily.  4. Folic acid 1 mg daily.  5. Ativan 1 mg b.i.d. for two days then (5 mg b.i.d. for two days).  6. Avelox 400 mg daily for 3 days.  7. Nicotine patch 21 mg daily.  8. Prednisone 40 mg daily for four days, then 30 mg daily for four days,      then 20 mg daily for four days, then 10 mg daily for four days.  9. Thiamine 100 mg daily.   Follow up with Dr. Ronne Binning in one to two weeks.  Patient was encouraged to  follow up with inpatient rehabilitation as planned.      Mobolaji B. Corky Downs, M.D.  Electronically Signed     MBB/MEDQ  D:  01/22/2006  T:  01/22/2006  Job:  [id number]   cc:   [name], M.D.

## 2010-10-01 NOTE — Discharge Summary (Signed)
[place]  Patient:    [name], [name]                      MRN: [id number] Adm. Date:  [id number] Disc. Date: [id number] Attending:  Judie Petit Dictator:   [name], M.D.                           Discharge Summary  ADMISSION DIAGNOSES: 1. Pneumonia. 2. Asthma. 3. Smoking addiction. 4. Anemia, chronic with history of iron pill treatment. 5. Occasional substance abuse. 6. Hypokalemia.  DISCHARGE DIAGNOSES: 1. Streptococcal pneumonia with bacteremia. 2. Asthma. 3. Smoking addiction. 4. Anemia of chronic disease. 5. Substance abuse. 6. Hypokalemia.  CONDITION ON DISCHARGE:  Stable.  DISPOSITION:  Follow up in my office in approximately 1-2 weeks.  DISCHARGE MEDICATIONS:  The patient signed out AMA, and stated she would contact office for continuance of medications.  HISTORY OF PRESENT ILLNESS:  This patient was a 50 year old black female who complained of shortness of breath after awakening about midnight.  She apparently had slept all day the day prior, and had no problems until that time.  She had no chest pain, diaphoresis, or wheezing.  She thus was admitted to the hospital by on call physician, Dr. Jeanella Anton.  She had a history of smoking for greater than 25 years with some alcohol usage, mostly beer every week. She was also on an Azmacort inhaler with intermittent use for a history of asthma.  PHYSICAL EXAMINATION:  VITAL SIGNS:  Blood pressure 82/50, pulse 112, temperature was 101.6 axillary.  GENERAL:  She appeared anxious.  HEENT:  Extraocular movements intact with eyes equally round and reactive to light and accommodation.  NECK:  No adenopathy or thyromegaly.  HEART:  Tachycardia, but no murmur.  CHEST:  Decreased breath sounds in the right lower quadrant, but no audible wheezing and no rales.  ABDOMEN:  Soft and flat, bowel sounds were active with no guarding, palpation, or  tenderness.  NEUROLOGIC:  The patient is alert and oriented x 3.  LABORATORY DATA:  Chest x-ray revealed right lower lobe infiltrates.  The patient was thus admitted to receive further treatment of presumptive right lower lobe streptococcal pneumonia as gram stain revealed cocci in pairs.  HOSPITAL COURSE:  The patient was admitted and placed on intravenous fluids with albuterol nebulizer, Pepcid 20 mg b.i.d., Humibid LA two b.i.d., and Levaquin 500 mg IV daily.  The patient was given Motrin alternating with Tylenol for pain and fever.  She gradually improved, but had workup for anemia by gastroenterologist, Dr. Roosvelt Harps, with upper GI, endoscopy, and colonoscopy.  There were no significant findings.  Her EKG revealed sinus tachycardia with pulmonary disease pattern.  Her chest x-ray revealed bilateral lower lobe infiltrates, right greater than left, with small pleural effusions.  ABG revealed initial pH of 7.438 with PCO2 33, PO2 of 82 on 100% mask.  CBC revealed a white count of 4000, hemoglobin 12.4 which dropped to 9.4, and hematocrit of 35.7 which dropped to 26.4.  The patient had a left shift with neutrophils of 91%, 7% lymphs, and 2% monocytes.  Increased bands with toxic granulation and dual bodies.  Occult blood stool was positive once and negative on repeat.  Fibrinogen was 435, which is normal.  Chemistries revealed a sodium of 132, potassium 3.4, glucose 166, this was corrected to normal without treatment.  G6PD screen was normal at 10.  CK and CK-MB were normal with troponin less than 0.3.  T4 was 6.1, TSH 0.087.  Serum iron was less than 10 with TIBC not done by lab.  B12 was 570, ribazole folate was 363. Urine drug screen was positive for acetaminophen and cannabinoids and methadone.  The patients urinalysis was normal with specific gravity of 1.020, trace leukocyte esterase, 6 to 10 white blood cells with 0 to 5 red blood cells, and a few bacteria.  Blood  cultures had no growth on one with streptococcal pneumoniae on the other, sensitive to ceftriaxone, levofloxacin, and penicillin.  Gram stain had gram positive cocci moderately, and a few gram negative rods in pairs.  Respiratory culture had rare gram negative cocci in pairs.  Helicobacter was negative.  RPR was reactive with titer of 2 and PTA was reactive.  HOSPITAL COURSE:  The patient was counseled regarding smoking and drug addiction.  She will consider these options.  She left the hospital prior to my getting to her room, but I was on the hospital floor, due to some social issues.  She will be monitored on an outpatient basis, and medications will be continued. DD:  10/21/99 TD:  10/25/99 Job: [id number]

## 2010-10-01 NOTE — H&P (Signed)
NAMECONOR, [name]             ACCOUNT NO.:  [id number]   MEDICAL RECORD NO.:  [id number]          PATIENT TYPE:  INP   LOCATION:  3735                         FACILITY:  [facility]   PHYSICIAN:  Herbie Saxon, MDDATE OF BIRTH:  07-09-1960   DATE OF ADMISSION:  08/20/2006  DATE OF DISCHARGE:                              HISTORY & PHYSICAL   PRESENTING COMPLAINT:  Chest pain, shortness of breath 1 day.   HISTORY OF PRESENT ILLNESS:  This is a 49 year old African-American  female who was recently admitted on March 25 to [facility] with  the diagnosis of pneumonia.  The patient, however, was discharged  against medical advice on August 18, 2006 despite extensive counseling.  She admits to having used cocaine in the interim before presenting this  morning with over night sharp retrosternal chest pain 6-7/10, shortness  of breath and wheezing, vomiting, nausea, denies any constipation or  diarrhea.  She has orthopnea and palpitations.  Chest pain is  nonradiating.  There is no hemoptysis, no syncopal episodes.   PAST MEDICAL HISTORY:  Congestive heart failure, hypertension, left-  sided pneumonia, pulmonary edema, cocaine abuse, liver cirrhosis,  bronchial asthma, iron deficiency anemia.   FAMILY HISTORY:  Mother had bronchial asthma with rheumatoid arthritis.  Father had heart disease and bronchial asthma.   SOCIAL HISTORY:  She smokes 1 pack per day for 20 years, cocaine and  alcohol abuse positive.  HIV negative.   MEDICATIONS:  Albuterol p.r.n..  The patient is very noncompliant with  medication and clinic appointment.   ALLERGIES:  No known drug allergies.   REVIEW OF SYSTEMS:  Twelve systems reviewed, positive only as above.   PHYSICAL EXAMINATION:  GENERAL:  She is a middle-aged lady cachectic,  disheveled in no acute respiratory distress.  VITAL SIGNS:  Temperature 98, pulse 100-108, respiratory rate 33, blood  pressure 100/60.  HEENT:  Dilated pupils but  reactive to light and accommodation.  Extraocular muscles intact.  NEURO:  Cranial nerves I-XII are intact.  She is alert and oriented x3,  peripheral pulses are present, no pedal edema.  HEART:  Sounds 1 and 2 are tachycardiac.  CHEST:  She has right upper coarse breath sounds, reduced breath sounds  bibasilarly with rales both bases greater on the left than the right.  ABDOMEN:  Soft and nontender, no organomegaly.  Bowel sounds are normal  and active.   LABORATORY DATA:  WBC is 5 and hematocrit 32, platelet count is 844.  CK/MB 1.1, troponin 0.05, D-dimer 0.39.  AST 14, ALT 21.  Chemistry-  glucose 92, sodium 139, potassium 4.6, chloride 101, bicarbonate 34, BUN  11, creatinine 0.9.  Urine tox is positive for cocaine.  Chest x-ray  shows left pleural effusion increased.   ASSESSMENT:  1. Acute respiratory distress.  2. Acute pulmonary edema.  3. Left pneumonia worsening pleural effusion which was partially      treated.  4. Cocaine addiction.  5. Medical noncompliance.  6. Tobacco/alcohol abuse.  7. Liver cirrhosis.  8. History of bronchial asthma and iron deficiency anemia.  9. Tachycardia.   The patient  is to be admitted to the med/surg floor on bed rest. Vital  signs every shift.  Daily weight, input and output monitoring, pulse ox  monitoring with O2 at 2 liters nasal cannula, keep O2 above 94%.  Diet  to be Heart Healthy, 2 grams sodium, IV fluid 25 mg per day.  Will check  BNP, TSH, blood cultures x2, urinalysis, chest x-ray and EKG.  Lovenox  40 mg subcu daily.  Aspirin 325 mg daily.  Protonix 40 mg IV daily.  Albuterol 2.5 mg via nebs q. 6 hours.  Ditropan 0.5 mg q. 6 hours.  Robitussin  q. 4 hours p.r.n. cough.  Tylenol 650 mg q. 4 hours p.r.n.  mild to moderate pain and fever.  Oxy-Codone 5/325 mg p.o. q. 4 hours  p.r.n. moderate to severe pain.  Avelox 400 mg IV daily.  Lasix 40 mg IV  daily.  Theo-Dur 20 mEq p.o. daily.  Ferrous sulfate 225 b.i.d.  Librium  25 mg  b.i.d.  CK/MB troponin, EKG, chest x-ray and ABG in the morning.  Watch for delirium tremens.  The patient is going to have social work in  put for placement in drug detoxification rehab at discharge.  At last  admission, she refused but she needs strong substance abuse counseling.  She remains a full code.      Herbie Saxon, MD  Electronically Signed     MIO/MEDQ  D:  08/20/2006  T:  08/21/2006  Job:  [id number]

## 2010-10-01 NOTE — H&P (Signed)
NAMEKRISTINE, Taylor             ACCOUNT NO.:  [id number]   MEDICAL RECORD NO.:  [id number]          PATIENT TYPE:  EMS   LOCATION:  MAJO                         FACILITY:  [facility]   PHYSICIAN:  Isidor Holts, M.D.  DATE OF BIRTH:  February 20, 1961   DATE OF ADMISSION:  12/01/2005  DATE OF DISCHARGE:                                HISTORY & PHYSICAL   PRIMARY CARE PHYSICIAN:  [name], M.D.   CHIEF COMPLAINT:  Shortness of breath, wheeze, and cough productive of  yellowish phlegm.   HISTORY OF PRESENT ILLNESS:  This is a 50 year old female.  For past medical  history, see below.  According to the patient, she was quite well until November 30, 2005, when she started getting increasingly short of breath and wheezy.  She also admits to cough productive of yellowish phlegm.  Denies fever or  chest pain.  She utilized her usual albuterol MDI, however, this was quite  unhelpful.  Eventually, her mother brought her to the emergency department  on December 01, 2005.   PAST MEDICAL HISTORY:  1.  Bronchial asthma.  2.  COPD.  3.  Smoker.  4.  Remote history of cocaine abuse (the patient claims none for the past 1      year).   MEDICATIONS:  Albuterol MDI p.r.n.   ALLERGIES:  No known drug allergies.   REVIEW OF SYSTEMS:  As per HPI and chief complaint, otherwise negative.   SOCIAL HISTORY:  The patient is unemployed, single, has three offspring all  of whom are alive and well.  She is a smoker, smokes approximately one pack  of cigarettes per day and has done so for at least 10 years.  She drinks  alcohol only occasionally.  She claims no cocaine use for the past 1 year.   FAMILY HISTORY:  The patient's mother is asthmatic and also has rheumatoid  arthritis.  Her father died at age 83, status post MI.  He was also  asthmatic.   PHYSICAL EXAMINATION:  VITAL SIGNS:  Temperature 98.9, pulse 73 per minute  regular, respiratory rate 20, blood pressure 100/64 mmHg, pulse oximetry 94%  on 2 liters of oxygen.  GENERAL:  The patient appears quite comfortable at present after several  nebulizer treatments in the emergency department.  Alert, communicative, and  not short of breath at rest.  Talking in complete sentences.  Accessory  respiratory muscles do not appear to be in play.  HEENT:  No clinical pallor, no jaundice, no conjunctival injection.  NECK:  Supple, JVP not seen.  No palpable lymphadenopathy.  No palpable  goiter.  CHEST:  Clinical clear to auscultation.  No wheezes and no crackles.  HEART:  Heart sounds 1 and 2 heard, normal and regular.  No murmurs.  ABDOMEN:  Full, soft, and nontender.  There is no palpable organomegaly and  no palpable masses.  Normal bowel sounds.  EXTREMITIES:  Lower extremity examination; no pitting edema, palpable  peripheral pulses.  MUSCULOSKELETAL:  Quite unremarkable.  CENTRAL NERVOUS SYSTEM:  No focal neurologic deficit on gross examination.   INVESTIGATIONS:  Still pending.  ASSESSMENT:  1.  infective exacerbation of bronchial asthma.  We shall admit the patient,      and treat with bronchodilator nebulizers,  oxygen supplementation and      steroids.  We shall also add Avelox in view of presence of cough,      productive of yellowish phlegm.  For completeness, we will do chest x-      ray and CBC.   1.  Smoking history.  The patient shall be counseled appropriately.      Meanwhile, we shall commence Nicoderm CQ patch.   1.  Remote history of substance abuse.  The patient claims to have quit      approximately 1 year ago.  We shall check urine drug screen.   Further management will depend on clinical course.      Isidor Holts, M.D.  Electronically Signed     CO/MEDQ  D:  12/01/2005  T:  12/01/2005  Job:  [id number]   cc:   [name], M.D.  Fax: [phone]

## 2010-10-01 NOTE — H&P (Signed)
NAME:  Tami, Taylor             ACCOUNT NO.:  05/29/2006   MEDICAL RECORD NO.:  [id number]          PATIENT TYPE:  EMS   LOCATION:  MAJO                         FACILITY:  [facility]   PHYSICIAN:  Mobolaji B. Bakare, M.D.DATE OF BIRTH:  Feb 23, 1961   DATE OF ADMISSION:  05/29/2006  DATE OF DISCHARGE:                              HISTORY & PHYSICAL   PRIMARY CARE PHYSICIAN:  [name], M.D..   CHIEF COMPLAINT:  1. Wheezing since yesterday.  2. Nausea and vomiting for 3 days.   HISTORY OF PRESENTING COMPLAINT:  [name] is a 50 year old African-  American female with history of COPD, asthma, alcohol abuse,  polysubstance abuse.  She started having nausea and vomiting after she  started diarrhea 3 days ago.  She has been unable to keep anything down.  She stated that the vomitus was sometimes mixed with fresh blood, but no  frank hematemesis.  There is no hematochezia.  She denies abdominal  pain.  Last night she started having wheezing and increased shortness of  breath.  She used albuterol and this did not help much.  She came to the  emergency room.  She has received Xopenex and IV Solu-Medrol.  Her  breathing is much better.   The patient is currently having  tremors, compatible with alcohol  withdrawal.  She admitted that she has been drinking, and her last drink  was yesterday.  She drinks  five 40 ounces per day.   REVIEW OF SYSTEMS:  She denies fever. She has been having hot and cold  feelings, but there is no diaphoresis.  She denies abdominal pain; no  chest pain.  There is no headaches or change in her vision.  She stated  she has been having increased frequency of micturition and dysuria.   PAST MEDICAL HISTORY:  1. Chronic obstructive pulmonary disease.  2. Alcohol withdrawal/dependence.  3. Polysubstance abuse.  4. Tobacco abuse.  5. Mild scoliosis.  6. Normocytic anemia.  7. Steriod-induced hyperglycemia.  8. Gastritis.   CURRENT MEDICATIONS:   Albuterol inhaler.   ALLERGIES:  NO KNOWN DRUG ALLERGIES.   FAMILY HISTORY:  The patient's mother is alive.  She has asthma and  rheumatoid arthritis.  Father died 40 years ago from heart trouble; he  was also asthmatic.   SOCIAL HISTORY:  The patient is single.  She has 3 children.  She smokes  about a pack per day, and she has been smoking for 20 years.  She tends  to underestimate tobacco and alcohol use. She uses cocaine; her last  cocaine use was yesterday.  She drinks alcohol 540 ounces per day.   PHYSICAL EXAMINATION:  VITAL SIGNS:  Initial vitals -- Temperature not  checked.  Blood pressure 156/105, pulse 102, respiratory rate 25, O2  saturations on air initially 89%, but now 100% on oxygen.  GENERAL:  On examination the patient is tremulous and not in respiratory  distress.  HEENT:  Normocephalic and atraumatic.  No oral thrush.  Mucous membranes  are moist.  No exudate.  NECK:  No carotid bruits.  She has prominent neck veins.  LUNGS:  Good air entry bilaterally; no wheeze or rhonchi.  CVS:  S1, S2.  Regular, no murmur and no gallop.  ABDOMEN:  Not distended; soft and nontender.  Bowel sounds present.  No  palpable organomegaly.  EXTREMITIES:  No pedal edema or calf tenderness.  CNS:  There is no focal neurological deficits.   LABORATORY DATA:  Initial laboratory data:  Creatinine 0.9. Sodium 135,  potassium 3.5, chloride 103, glucose 170.  BNP __________ , venous pH  7.30, ABG:  pH 7.349, pCO2 45, pO2  189, oxygen saturation 100%.  Hemoglobin 15, hematocrit 44.   RADIOLOGICAL DATA:  Chest x-ray showed hyperinflated lungs; no acute  abnormality.   ASSESSMENT AND PLAN:  [name] is a 50 year old African-American  female, presenting with nausea, vomiting, diarrhea and exacerbation of  asthma.  She has been drinking alcohol, and she is having withdrawal  symptoms.   ADMISSION DIAGNOSES:  1. Alcohol withdrawal symptoms.  2. Asthma exacerbation.  3. Severe cocaine  abuse.  4. Tobacco abuse.  5. Alcohol dependence.  6. Gastroenteritis.  7. Probable Mallory-Weiss tear.    Will admit her to the telemetry floor.  Start alcohol withdrawal  protocol.  Supplement with thiamine 100 mg daily, folic acid 1 mg daily,  multivitamin one tablet daily.  IV fluid normal saline at 125 cc/hr.  Nebulizers albuterol q. 4 h. and q. 2. p.r.n.  Solu-Medrol 40 mg IV q.6  h.  Protonix 40 mg IV daily.  Phenergan 12.5 mg q. 4 h. p.r.n. nausea  and vomiting.  Nicotine patch 41 mg daily.   Will check abdominal x-ray to rule out small bowel obstruction.  Stool  cultures, C. difficile and fecal leukocytes. Will offer tobacco, alcohol  and drug cessation counseling.  Check urine drug screen.  Urinalysis and  culture.   If bleeding continues and hemoglobin drops, we will ask for a GI  evaluation.      Mobolaji B. Corky Downs, M.D.  Electronically Signed     MBB/MEDQ  D:  05/29/2006  T:  05/29/2006  Job:  [id number]   cc:   [name], M.D.

## 2010-10-01 NOTE — Discharge Summary (Signed)
[name], [name]             ACCOUNT NO.:  [id number]   MEDICAL RECORD NO.:  [id number]          PATIENT TYPE:  INP   LOCATION:  3730                         FACILITY:  [facility]   PHYSICIAN:  [name], M.D. DATE OF BIRTH:  1961-04-22   DATE OF ADMISSION:  08/08/2006  DATE OF DISCHARGE:                               DISCHARGE SUMMARY   FINAL DIAGNOSES:  1. Extensive left side pneumonia secondary to strep pneumonia.  2. Strep pneumonia bacteremia.  3. Pulmonary edema.  4. Abdominal pain.  5. Hypoalbumin.  6. Urine drug screen positive for cocaine.  7. Anemia.  8. Hepatomegally on CT abdomen   PROCEDURE:  1. Acute abdominal x-ray completed on August 06, 2006.  2. CT scan of the abdomen and pelvis, completed with contrast on August 08, 2006.  3. Abdominal x-ray completed August 08, 2006.  4. Two-view chest x-ray completed August 08, 2006.  5. Chest x-ray, two-view, August 10, 2006.  6. Portable chest x-ray, August 11, 2006.  7. CT scan of the chest with angiographic contrast, August 12, 2006.  8. Two-D echocardiogram completed August 12, 2006.   HISTORY OF PRESENT ILLNESS:  [name] is a 50 year old female, who  arrived at the hospital with a chief complaint of abdominal discomfort.  She was a very poor historian when she initially arrived in the ER;  therefore, the details of what actually brought her here were given by  the patient's mother.  According to the patient's mother, the patient  had been feeling ill for at least 2 weeks.  She went to see her primary  care doctor Thursday or Friday which was several days prior to this  admission.  Her primary care doctor told her that she had the flu.  She  was provided with a Z-pack, Vicodin, and Tussionex.  She took the  medications; however, her symptoms did not improve.  She came to the ER  2 days later on August 06, 2006.  She was provided a prescription for  Avelox; however, her symptoms again did not resolve.  On  the morning of  admission, she woke up and stated that her belly button felt as if it  were sticking out.  Her mother indicated that the patient had been  coughing throughout the course of the night.  She had been coughing up  large amounts of greenish phlegm.  There were slight streaks of blood  mixed with the patient's sputum.  The patient indicated that she had  developed a fever or 103 several days prior to presenting to the ER for  further evaluation.   PAST MEDICAL HISTORY:  Please see that dictated by Dr. Michaelyn Taylor.   HOSPITAL COURSE:  1. Extensive left lung pneumonia.  A two-view chest x-ray was      completed on March 25.  It revealed extensive left lung pneumonia      as well as stable changes of COPD.  The patient was started on      empiric IV antibiotics consisting of vancomycin and Rocephin.      Sputum and  blood cultures were both ordered.  The patient's sputum      came back negative for Pneumocystis carinii and also negative for      Legionella.  She was also tested for HIV and was found to be      negative for that.  As far as Gram stain, there are no reports in      the computer system with regard to this.  2. Pulmonary edema.  The patient was initially admitted to the      medicine floor; however, on August 11, 2006, sometime later in the      afternoon, the patient got up from her bed and ambulated to the      bathroom.  She stated that shortly after returning from the      bathroom, that short trip caused her to become significantly short      of breath.  It appeared that she had gone into respiratory      distress.  She was transferred from the medicine floor to the step-      down unit.  A chest x-ray had been done on August 10, 2006, which      revealed mild improved aeration to the left lung; however, a repeat      chest x-ray was done when the patient went into respiratory      distress on August 11, 2006.  It revealed increased and left mid      lower  lungs pneumonia and increase in left-sided pleural effusion.      A CT scan of the patient's chest was also done on August 12, 2006,      secondary to the sudden onset of shortness of breath.  It was      negative for pulmonary embolism.  A single minimally prominent      right hilar lymph node was present, nonspecific left pleural      effusion with extensive infiltrate throughout the lingula and mild      bilateral lower lobe atelectasis were seen.  A 2 D echocardiogram      was completed on March 29 which revealed overall left ventricular      systolic function to be normal.  Left ventricular EF was estimated      to be 60%.  There were no left ventricular regional wall motion      abnormalities.  Lasix was added to the patient's medication regimen      as her BNP was noted to have increased to 639.0, supporting a      diagnosis of pulmonary edema which had also been indicated on both      CT scan and chest x-ray by the presence of fluid.  The patient's      breathing has improved significantly, and she has since been      transferred from step-down back to the medicine floor.  3. Blood cultures positive for streptococcus pneumonia.  On August 06, 2006, the patient had an aerobic blood culture completed.  This was      actually during an ER visit on March 23.  It was later reported on      March 27 to be positive for streptococcus pneumonia.  The organism      was found to be sensitive to ceftriaxone and levofloxacin.  The      patient had been started on ceftriaxone at the time of this      admission.  Her vancomycin was discontinued, and the ceftriaxone      has continued.  4. Abdominal pain.  The etiology of the patient's abdominal pain is      questionable.  This was more prominent at the time of her      admission.  A CAT scan was completed on March 25 of the patient's      abdomen.  It revealed left upper lobe and left lower lobe     pneumonia, hepatomegaly without focal  lesion, no ascites.  The      radiologist indicated that the liver was significantly enlarged due      to hepatocellular disease, possibly cirrhosis.  The spleen was not      enlarged.  No liver lesions were seen.  The pancreas was normal.      There may have been some inflammation or irritation secondary to      the pneumonia.  The patient has received p.r.n. pain medication.      She has not complained about any significant pain over the last      several days.  Therefore, there appears to be some improvement.  5. Asthma exacerbation.  The patient does report a history of asthma,      and there may have been component of asthma exaccerbation      contributing.  The patient was started on IV Solu-Medrol.  This may      be switched over to prednisone sometime soon.  6. Iron deficiency.  The patient's iron studies indicated that her      iron level was 26.  The percent saturation was 11.  As a result,      she has been started on ferrous sulfate 325 mg p.o. twice daily.  7. Substance abuse.  At the time of her admission, the patient did      have a urine drug screen completed.  It was positive for cocaine as      well as opiates.  8. Hypoalbumin.  There may be an element of protein calorie      malnutrition associated with this.  Nutrition has been consulted.      This brings me up to August 15, 2006.  9. Hypokalemia.  The patient has received supplementation secondary to      this.     [name], M.D.  Electronically Signed    OR/MEDQ  D:  08/15/2006  T:  08/15/2006  Job:  [id number]

## 2010-10-01 NOTE — Discharge Summary (Signed)
NAMESHAUNNA, [name]             ACCOUNT NO.:  [id number]   MEDICAL RECORD NO.:  [id number]          PATIENT TYPE:  INP   LOCATION:  6736                         FACILITY:  [facility]   PHYSICIAN:  Lonia Blood, M.D.       DATE OF BIRTH:  [birth year]   DATE OF ADMISSION:  05/29/2006  DATE OF DISCHARGE:  06/02/2006                               DISCHARGE SUMMARY   The patient's primary care physician is Dr. Billee Cashing.   DISCHARGE DIAGNOSES:  1. Alcohol withdrawal, resolved.  2. Asthma exacerbation, resolved.  3. Hyperkalemia, resolved.  4. Alcoholic gastritis, resolving.  5. Cocaine abuse.  6. Alcohol abuse  7. Tobacco abuse.   DISCHARGE MEDICATIONS:  1. Folic acid 1 mg daily.  2. Vitamin B1 100 mg daily.  3. Ativan 0.5 mg twice a day June 03, 2006 and once a day on      June 04, 2006.  4. Potassium chloride 40 mEq daily for 3 days.  5. Albuterol 1 puff 4 times a day as needed.  6. Prilosec OTC 20 mg daily.   CONDITION ON DISCHARGE:  [name] was discharged in good condition.  At the time of discharge, she was in no acute distress, alert, oriented,  afebrile and with stable vital signs.  The patient was instructed to  follow up with Dr. Billee Cashing as needed.   PROCEDURES DURING THIS ADMISSION:  No procedures were done.   CONSULTATIONS:  No consultations were obtained.   For admission history and physical, refer to dictated H&P done by Dr.  Angelena Sole May 29, 2006.   HOSPITAL COURSE:  1. Respiratory distress.  [name] was admitted with diagnosis of      asthma exacerbation.  She received intravenous steroids, nebulizer      treatments and oxygen.  By hospital day #2, the patient's      respiratory distress had completely resolved.  We have transitioned      the patient to a MDI albuterol inhaler as needed, and she did well      throughout the hospitalization.  The patient's steroids have been      discontinued without any recurrence in the patient's  respiratory      symptoms.  In retrospect, it was felt that the patient's asthma      exacerbation was probably brought on by the use of cocaine.  2. Alcohol withdrawal.  [name] was delirious and confused for the      first 2 days of hospitalization, and she was also tachycardiac and      with agitation, tremulousness and nervousness.  The patient was      treated with intravenous vitamins as well as Ativan as needed.  The      patient's withdrawal symptoms improved significantly, and by      June 02, 2006, the patient was felt to be safe for discharge.  3. Mild epigastric pain.  This was definitely related to a following a      bout of alcoholic gastritis since the patient improved      significantly with alcohol cessation, bowel rest  and proton pump      inhibitors.  By the time of discharge, the patient was able to      tolerate a full liquid diet without any abdominal pain.  Full      evaluation was pursued with liver function tests and lipase level      which were all within normal limits.      Lonia Blood, M.D.  Electronically Signed     SL/MEDQ  D:  06/15/2006  T:  06/15/2006  Job:  [id number]   cc:   [name], M.D.

## 2010-10-01 NOTE — Discharge Summary (Signed)
[name], SPOHR             ACCOUNT NO.:  [id number]   MEDICAL RECORD NO.:  [id number]          PATIENT TYPE:  INP   LOCATION:  3730                         FACILITY:  [facility]   PHYSICIAN:  Herbie Saxon, MDDATE OF BIRTH:  [birth year]   DATE OF ADMISSION:  08/08/2006  DATE OF DISCHARGE:  08/18/2006                               DISCHARGE SUMMARY   MODE OF DISCHARGE:  Discharge against medical advice on August 18, 2006.   RADIOLOGY:  Chest x-ray on August 16, 2006 shows improvement in the left  lung consolidation.   DISCHARGE DIAGNOSES:  Left lung pneumonia.  Cocaine and alcohol abuse.  Anemia.  Gastritis.  History of bronchial asthma.  COPD.  Iron deficiency anemia.   HOSPITAL COURSE:  This 50 year old African-American lady was admitted  with abdominal discomfort, cough productive of slight streaks of blood.  At presentation the chest x-ray showed extensive left lung infiltrate  and leukocytosis with hyponatremia.  She was also noted to be  hypoalbuminemic.  She was started on IV Rocephin and vancomycin.  Tachycardia and left lung pleuritic pain clinically improved.  Leukocytosis was also resolving.  She was given gentle IV fluid  hydration.  For abdominal pain she was placed on proton pump inhibitor.  blood culture came back positive for Streptococcus pneumoniae.  She did  develop some pulmonary congestion by the first day of admission and IV  fluid was reduced.  She was continued on steroids and bronchodilators q4-  6h.  Asthma remained stable.  A GI consult was also set up for  nutritional supplementation.  The day prior to hrly she was noticed to  be having a low blood pressure of 89/60.  She was placed on orthostatic  blood pressure check q.6 hourly.  The EKG showed prolonged Q/T interval;  however, the serial cardiac enzymes were negative.  The patient was very  agitated and despite the abnormal T wave elevation and initial bump in  troponin.  She insisted on  being discharged home against medical advice.  She was counseled about the consequences.  Primary Care Physician, Dr.  Ronne Binning is to alerted about this development.  Likely the patient is  wanting to go home to use drugs.  Social Work and Psych consults were  called.   Medications on discharge include:  1. Albuterol 2 puffs q.6h. p.r.n.  2. Avelox 400 mg daily for one week.  3. Tussionex 5 ml p.o. t.i.d.  4. Vicodin 5/500 1 q.6h. p.r.n.  5. Prednisone 20 mg daily.  6. Prilosec 20 mg daily.  7. Ambien 5 mg q.h.s. p.r.n.   Note, the HIV test done on this admission was negative.      Herbie Saxon, MD  Electronically Signed     MIO/MEDQ  D:  09/21/2006  T:  09/21/2006  Job:  [id number]

## 2010-10-01 NOTE — H&P (Signed)
[name], [name]                         ACCOUNT NO.:  [id number]   MEDICAL RECORD NO.:  [id number]                   PATIENT TYPE:  OBV   LOCATION:  5007                                 FACILITY:  [facility]   PHYSICIAN:  Hettie Holstein, D.O.                 DATE OF BIRTH:  [birth year]   DATE OF ADMISSION:  11/22/2003  DATE OF DISCHARGE:                                HISTORY & PHYSICAL   PRIMARY CARE PHYSICIAN:  Dr. [name].   SERVICE:  [facility].   CHIEF COMPLAINT:  Wheezing and cough.   HISTORY OF PRESENTING ILLNESS:  This is a 50 year old female with history of  asthma and emphysema, with polysubstance abuse, who describes difficulty  breathing for the past few days with yellow productive cough.  She denies  any prior history of ICU courses or mechanical ventilation and she reports  using metered-dose inhaler at home, typically no more than once every other  day; she has increased her frequency lately and had no relief with her  metered-dose inhalers at home.  She had seen her primary care physician a  couple of weeks ago and had been in her usual state of health.  In the  emergency department, she was noted to have a decreased peak flow of 60,  however, she exhibited poor effort at that time.  In any event, she  continued to have wheezing.  She had been given a dose of IV Solu-Medrol in  addition to the nebulizer treatments.  She is being admitted for 23-hour  observation and continued nebulizer treatments, antibiotic therapy as well  as steroids.   PAST MEDICAL HISTORY:  Past medical history is significant as noted above  for:  1. Asthma.  2. COPD.  3. Polysubstance abuse including current cocaine and tobacco as well as     alcohol.   She denies any prior history of surgery or heart disease, kidney disease or  liver disease that she is aware of.   MEDICATIONS:  She uses albuterol metered-dose inhaler as needed.   ALLERGIES:  No known drug allergies.   FAMILY  HISTORY:  She has a mother, age 45, alive and according to her,  healthy.  Father died at age 19 with MI.   SOCIAL HISTORY:  Social history is significant for a 1-pack-per-day smoking  history greater than 20 years.  She drinks 1-2 times a week; she denies any  history of withdrawal syndromes and denies dependency on alcohol.  She does  report crack cocaine use; in fact, she has used as recently as yesterday.  She denies any IV drug abuse.  She reports recently being tested by Dr.  Ronne Binning for HIV and she said that this was negative.   REVIEW OF SYSTEMS:  Review of systems are otherwise unremarkable except for  a complaint of subjective fevers, no nausea, vomiting or diarrhea.  She  reports no weight loss;  she says that her weight is at its baseline and  appetite is good.  No diarrhea or hematochezia.  No melena.  No abdominal  pain.  No vaginal discharge.   LABORATORY DATA:  Her i-STAT in the emergency department revealed a sodium  of 135, potassium 3.6, creatinine of 0.7, PCO2 of 44, pH of 7.36; hemoglobin  was 12.6, hematocrit 37.   Chest x-ray revealed chronic obstructive pulmonary disease and emphysema.   Peak flow, as noted above, revealed peak of 60, 80 and 80 post treatment.   IMPRESSION:  1. Chronic obstructive pulmonary disease exacerbation.  2. Emphysema.  3. Polysubstance abuse.   PLAN:  At this time, we are going to admit [name] for 23-hour  observation, continue her treatments with albuterol/Atrovent, ask Case  Management assistance with possibly setting [name] up with a nebulizer  machine at home and we will start her on doxycycline.                                                Hettie Holstein, D.O.    ESS/MEDQ  D:  11/22/2003  T:  11/23/2003  Job:  [id number]

## 2010-11-22 ENCOUNTER — Emergency Department (HOSPITAL_COMMUNITY)
Admission: EM | Admit: 2010-11-22 | Discharge: 2010-11-22 | Disposition: A | Discharge status: Home / Self Care | Payer: Medicare Other | Attending: Emergency Medicine | Admitting: Emergency Medicine

## 2010-11-22 DIAGNOSIS — N898 Other specified noninflammatory disorders of vagina: Secondary | ICD-10-CM | POA: Insufficient documentation

## 2010-11-22 DIAGNOSIS — K644 Residual hemorrhoidal skin tags: Secondary | ICD-10-CM | POA: Insufficient documentation

## 2010-11-22 DIAGNOSIS — N949 Unspecified condition associated with female genital organs and menstrual cycle: Secondary | ICD-10-CM | POA: Insufficient documentation

## 2010-11-22 DIAGNOSIS — J4489 Other specified chronic obstructive pulmonary disease: Secondary | ICD-10-CM | POA: Insufficient documentation

## 2010-11-22 DIAGNOSIS — J449 Chronic obstructive pulmonary disease, unspecified: Secondary | ICD-10-CM | POA: Insufficient documentation

## 2010-11-22 LAB — URINALYSIS, ROUTINE W REFLEX MICROSCOPIC
Bilirubin Urine: NEGATIVE
Ketones, ur: NEGATIVE mg/dL
Leukocytes, UA: NEGATIVE
Nitrite: NEGATIVE
Specific Gravity, Urine: 1.016 (ref 1.005–1.030)
Urobilinogen, UA: 0.2 mg/dL (ref 0.0–1.0)

## 2010-11-22 LAB — WET PREP, GENITAL: Yeast Wet Prep HPF POC: NONE SEEN

## 2010-11-22 LAB — POCT PREGNANCY, URINE: Preg Test, Ur: NEGATIVE

## 2010-11-23 LAB — GC/CHLAMYDIA PROBE AMP, GENITAL: Chlamydia, DNA Probe: NEGATIVE

## 2011-01-12 ENCOUNTER — Emergency Department (HOSPITAL_COMMUNITY)
Admission: EM | Admit: 2011-01-12 | Discharge: 2011-01-12 | Discharge status: Against Medical Advice | Payer: Medicare Other | Attending: Emergency Medicine | Admitting: Emergency Medicine

## 2011-01-13 ENCOUNTER — Emergency Department (HOSPITAL_COMMUNITY): Payer: Medicare Other

## 2011-01-13 ENCOUNTER — Emergency Department (HOSPITAL_COMMUNITY)
Admission: EM | Admit: 2011-01-13 | Discharge: 2011-01-13 | Disposition: A | Discharge status: Home / Self Care | Payer: Medicare Other | Attending: Emergency Medicine | Admitting: Emergency Medicine

## 2011-01-13 DIAGNOSIS — J449 Chronic obstructive pulmonary disease, unspecified: Secondary | ICD-10-CM | POA: Insufficient documentation

## 2011-01-13 DIAGNOSIS — J3489 Other specified disorders of nose and nasal sinuses: Secondary | ICD-10-CM | POA: Insufficient documentation

## 2011-01-13 DIAGNOSIS — R0602 Shortness of breath: Secondary | ICD-10-CM | POA: Insufficient documentation

## 2011-01-13 DIAGNOSIS — J4489 Other specified chronic obstructive pulmonary disease: Secondary | ICD-10-CM | POA: Insufficient documentation

## 2011-01-13 DIAGNOSIS — R51 Headache: Secondary | ICD-10-CM | POA: Insufficient documentation

## 2011-01-13 DIAGNOSIS — R0682 Tachypnea, not elsewhere classified: Secondary | ICD-10-CM | POA: Insufficient documentation

## 2011-01-14 ENCOUNTER — Institutional Professional Consult (permissible substitution): Payer: Medicare Other | Admitting: Pulmonary Disease

## 2011-01-19 ENCOUNTER — Ambulatory Visit (INDEPENDENT_AMBULATORY_CARE_PROVIDER_SITE_OTHER): Payer: Medicare Other | Admitting: Pulmonary Disease

## 2011-01-19 ENCOUNTER — Encounter: Payer: Self-pay | Admitting: Pulmonary Disease

## 2011-01-19 VITALS — BP 132/74 | HR 87 | Temp 97.7°F | Ht 66.0 in | Wt 196.6 lb

## 2011-01-19 DIAGNOSIS — R0609 Other forms of dyspnea: Secondary | ICD-10-CM

## 2011-01-19 DIAGNOSIS — R0989 Other specified symptoms and signs involving the circulatory and respiratory systems: Secondary | ICD-10-CM

## 2011-01-19 HISTORY — DX: Other forms of dyspnea: R06.09

## 2011-01-19 NOTE — Progress Notes (Deleted)
  Subjective:    Patient ID: Tecia Cinnamon, female    DOB: 09-Sep-1960, 50 y.o.   MRN: [id number]  HPI    Review of Systems  Constitutional: Negative for fever and unexpected weight change.  HENT: Negative for ear pain, nosebleeds, congestion, sore throat, rhinorrhea, sneezing, trouble swallowing, dental problem, postnasal drip and sinus pressure.   Eyes: Negative for redness and itching.  Respiratory: Negative for cough, chest tightness, shortness of breath and wheezing.   Cardiovascular: Negative for palpitations and leg swelling.  Gastrointestinal: Negative for nausea and vomiting.  Genitourinary: Negative for dysuria.  Musculoskeletal: Negative for joint swelling.  Skin: Negative for rash.  Neurological: Negative for headaches.  Hematological: Does not bruise/bleed easily.  Psychiatric/Behavioral: Negative for dysphoric mood. The patient is not nervous/anxious.        Objective:   Physical Exam        Assessment & Plan:

## 2011-01-19 NOTE — Assessment & Plan Note (Addendum)
The patient has significant dyspnea on exertion that has worsened over the last few months.  She has been given the diagnosis of "COPD", but it is unclear if she has had pulmonary function studies.  She feels that her breathing improved upon starting Spiriva a year ago, but did not see any change in her breathing since starting a prednisone taper.  She has also gained 40 pounds of weight in the last one year, which can certainly contribute to dyspnea.  She did have oxygen desaturation with significant ambulation around the office today.  At this point, I would like to check full pulmonary function studies to evaluate her degree of airflow obstruction and also to evaluate her lung volumes/DLCO.  If she does not have significant obstructive disease, would need to consider chronic VTE, shunting, and cardiac disease.

## 2011-01-19 NOTE — Patient Instructions (Signed)
Will schedule for full breathing tests, and would like to see you back same day to review Stay on spiriva and albuterol for now, finish up your prednisone over the next few days. Work on weight loss

## 2011-01-19 NOTE — Progress Notes (Signed)
  Subjective:    Patient ID: [name], female    DOB: [birth year], 50 y.o.   MRN: [id number]  HPI The patient is a 50 year old female who I've been asked to see for dyspnea on exertion.  She's been given the diagnosis of "COPD" years ago, and admits that she has had chronic dyspnea on exertion for the last 3 years.  She has noticed worsening over the last 2 months, and apparently was seen in the emergency room last week where she was put on a prednisone taper.  She is seen no change in her breathing since being on this medication.  The patient also takes Spiriva, and has been on this one year.  She thinks this did improve her breathing once she started.  She currently describes a one block dyspnea on exertion at a moderate pace, and will get winded bringing groceries in from the car.  She will also get winded with light housework.  She has a chronic a.m. Cough with pale yellow mucous at times.  She has not smoked in over 2 years.  The patient does state that she has gained weight pounds in the last one year.  She has no history of heart disease, and tells me that she had a workup in 2009 that was unremarkable.  The patient has had a recent chest x-ray that was unremarkable.   Review of Systems  Constitutional: Negative for fever and unexpected weight change.  HENT: Positive for ear pain and congestion. Negative for nosebleeds, sore throat, rhinorrhea, sneezing, trouble swallowing, dental problem, postnasal drip and sinus pressure.   Eyes: Negative for redness and itching.  Respiratory: Positive for cough and shortness of breath. Negative for chest tightness and wheezing.        Productive cough  Cardiovascular: Positive for leg swelling. Negative for palpitations.  Gastrointestinal: Negative for nausea and vomiting.  Genitourinary: Negative for dysuria.  Musculoskeletal: Negative for joint swelling.  Skin: Negative for rash.  Neurological: Positive for headaches.  Hematological: Does not  bruise/bleed easily.  Psychiatric/Behavioral: Negative for dysphoric mood. The patient is not nervous/anxious.        Objective:   Physical Exam Constitutional:  Overweight female, no acute distress  HENT:  Nares patent without discharge  Oropharynx without exudate, palate and uvula are normal  Eyes:  Perrla, eomi, no scleral icterus  Neck:  No JVD, no TMG  Cardiovascular:  Normal rate, regular rhythm, no rubs or gallops.  No murmurs        Intact distal pulses  Pulmonary :  Normal breath sounds, no stridor or respiratory distress   No rales, rhonchi, or wheezing.  Prominent upper airway pseudowheezing, with transmission to bases.  Abdominal:  Soft, nondistended, bowel sounds present.  No tenderness noted.   Musculoskeletal:  No lower extremity edema noted.  Lymph Nodes:  No cervical lymphadenopathy noted  Skin:  No cyanosis noted  Neurologic:  Alert, appropriate, moves all 4 extremities without obvious deficit.         Assessment & Plan:

## 2011-01-28 ENCOUNTER — Encounter (HOSPITAL_COMMUNITY): Payer: Medicare Other

## 2011-01-28 ENCOUNTER — Ambulatory Visit: Payer: Medicare Other | Admitting: Pulmonary Disease

## 2011-02-11 ENCOUNTER — Ambulatory Visit (INDEPENDENT_AMBULATORY_CARE_PROVIDER_SITE_OTHER): Payer: Medicare Other | Admitting: Pulmonary Disease

## 2011-02-11 ENCOUNTER — Encounter: Payer: Self-pay | Admitting: Pulmonary Disease

## 2011-02-11 VITALS — BP 122/80 | HR 95 | Temp 97.6°F | Ht 63.0 in | Wt 196.0 lb

## 2011-02-11 DIAGNOSIS — R0609 Other forms of dyspnea: Secondary | ICD-10-CM

## 2011-02-11 DIAGNOSIS — R0989 Other specified symptoms and signs involving the circulatory and respiratory systems: Secondary | ICD-10-CM

## 2011-02-11 DIAGNOSIS — J449 Chronic obstructive pulmonary disease, unspecified: Secondary | ICD-10-CM | POA: Insufficient documentation

## 2011-02-11 LAB — PULMONARY FUNCTION TEST

## 2011-02-11 NOTE — Patient Instructions (Signed)
Continue with Spiriva, and will add symbicort 160/4.5 2 puffs am and pm everyday.  Rinse mouth well. Will start on oxygen with exertional activities. We'll refer to pulmonary rehabilitation at [facility].  Let us know if you do not hear from them in the next 2 weeks. Work aggressively on weight loss. Followup with me in 3 months, or sooner if having breathing problems.

## 2011-02-11 NOTE — Progress Notes (Signed)
PFT done today. 

## 2011-02-11 NOTE — Assessment & Plan Note (Signed)
The patient has severe airflow obstruction on her pulmonary function studies today, and this is most likely due to emphysema.  I would like to add a trial of symbicort to her Spiriva, start her on oxygen with exertional activities, and we'll refer her to the pulmonary rehabilitation program at [facility].  I have also encouraged her to work aggressively on weight loss.

## 2011-02-11 NOTE — Progress Notes (Signed)
  Subjective:    Patient ID: [name], female    DOB: 06/15/1960, 50 y.o.   MRN: [id number]  HPI The patient comes in today for followup of her PFTs.  She was found to have severe airflow obstruction, no restriction, and a severe decrease in diffusion capacity.  I have reviewed the study with her in detail, and answered all of her questions.   Review of Systems  Constitutional: Negative for fever and unexpected weight change.  HENT: Negative for ear pain, nosebleeds, congestion, sore throat, rhinorrhea, sneezing, trouble swallowing, dental problem, postnasal drip and sinus pressure.   Eyes: Negative for redness and itching.  Respiratory: Positive for cough and shortness of breath. Negative for chest tightness and wheezing.   Cardiovascular: Negative for palpitations and leg swelling.  Gastrointestinal: Negative for nausea and vomiting.  Genitourinary: Negative for dysuria.  Musculoskeletal: Negative for joint swelling.  Skin: Negative for rash.  Neurological: Negative for headaches.  Hematological: Does not bruise/bleed easily.  Psychiatric/Behavioral: Negative for dysphoric mood. The patient is not nervous/anxious.        Objective:   Physical Exam Obese female in no acute distress Nose without purulence or discharge Chest with decreased breath sounds, but no wheezes or rhonchi Cardiac exam with regular rate and rhythm Lower extremities without significant edema, No cyanosis noted Alert and oriented, moves all 4 extremities.       Assessment & Plan:

## 2011-02-15 LAB — COMPREHENSIVE METABOLIC PANEL
AST: 14
BUN: 8
CO2: 29
Chloride: 106
Creatinine, Ser: 0.7
GFR calc Af Amer: >60
GFR calc non Af Amer: >60
Glucose, Bld: 87
Total Bilirubin: 0.6

## 2011-02-15 LAB — DIFFERENTIAL
Basophils Absolute: 0.1
Eosinophils Absolute: 0.2
Eosinophils Relative: 5
Lymphocytes Relative: 42

## 2011-02-15 LAB — PROTIME-INR: Prothrombin Time: 14.4

## 2011-02-15 LAB — CBC
HCT: 40.3
Hemoglobin: 13.5
MCV: 92.8
RBC: 4.34
WBC: 4.2

## 2011-02-15 LAB — RAPID URINE DRUG SCREEN, HOSP PERFORMED
Amphetamines: NOT DETECTED
Barbiturates: NOT DETECTED
Tetrahydrocannabinol: NOT DETECTED

## 2011-02-21 ENCOUNTER — Encounter: Payer: Self-pay | Admitting: Pulmonary Disease

## 2011-02-24 ENCOUNTER — Telehealth: Payer: Self-pay | Admitting: Pulmonary Disease

## 2011-02-24 DIAGNOSIS — J449 Chronic obstructive pulmonary disease, unspecified: Secondary | ICD-10-CM

## 2011-02-24 NOTE — Telephone Encounter (Signed)
Per Bjorn Loser, pulm rehab is booked out at least 4 wks, maybe 6. Spoke with pt and notified that they will be contacting her once she they can get her in. Spoke with pt and notified of this and she verbalized understanding.

## 2011-05-13 ENCOUNTER — Ambulatory Visit: Payer: Medicare Other | Admitting: Pulmonary Disease

## 2011-05-16 ENCOUNTER — Ambulatory Visit (INDEPENDENT_AMBULATORY_CARE_PROVIDER_SITE_OTHER): Payer: Medicare Other | Admitting: Pulmonary Disease

## 2011-05-16 ENCOUNTER — Other Ambulatory Visit: Payer: Medicare Other

## 2011-05-16 ENCOUNTER — Encounter: Payer: Self-pay | Admitting: Pulmonary Disease

## 2011-05-16 VITALS — BP 110/76 | HR 81 | Temp 97.9°F | Ht 63.0 in | Wt 206.4 lb

## 2011-05-16 DIAGNOSIS — J449 Chronic obstructive pulmonary disease, unspecified: Secondary | ICD-10-CM

## 2011-05-16 NOTE — Patient Instructions (Signed)
Continue on symbicort and spiriva Will send an order to [facility] to provide pulsed oxygen Will check a blood test for a genetic form of emphysema Work hard on weight loss.  This will really improve your breathing. followup with me in 6mos if doing well.

## 2011-05-16 NOTE — Assessment & Plan Note (Signed)
The patient has severe obstructive disease based on pulmonary function studies, and is improved on aggressive bronchodilator regimen and also exertional oxygen.  I have asked her to continue on this, and stressed the importance of weight loss and conditioning.  Unfortunately, she is unable to afford pulmonary rehabilitation.  Will also need to check an alpha-1 antitrypsin level today given the severity of her lung disease.

## 2011-05-16 NOTE — Progress Notes (Signed)
  Subjective:    Patient ID: [name], female    DOB: 01/08/1961, 50 y.o.   MRN: [id number]  HPI The patient comes in today for followup of her known COPD with chronic respiratory failure.  At the last visit, symbicort was added to her Spiriva, and she feels this has helped her breathing.  She is staying on her oxygen compliantly, and has not had a recent acute exacerbation.  She has a mild cough with nonpurulent mucus in the morning, but this resolves as the day goes on.   Review of Systems  Constitutional: Negative.  Negative for fever and unexpected weight change.  HENT: Negative.  Negative for ear pain, nosebleeds, congestion, sore throat, rhinorrhea, sneezing, trouble swallowing, dental problem, postnasal drip and sinus pressure.   Eyes: Negative.  Negative for redness and itching.  Respiratory: Positive for cough and shortness of breath. Negative for chest tightness and wheezing.   Cardiovascular: Negative.  Negative for palpitations and leg swelling.  Gastrointestinal: Negative.  Negative for nausea and vomiting.  Genitourinary: Negative.  Negative for dysuria.  Musculoskeletal: Negative.  Negative for joint swelling.  Skin: Negative.  Negative for rash.  Neurological: Negative.  Negative for headaches.  Hematological: Negative.  Does not bruise/bleed easily.  Psychiatric/Behavioral: Negative.  Negative for dysphoric mood. The patient is not nervous/anxious.        Objective:   Physical Exam Morbidly obese female in no acute distress Nose without purulence or discharge noted Chest with mild decrease in breath sounds, no wheezes or rhonchi Cardiac exam was regular rate and rhythm Lower extremities with mild edema, no cyanosis noted Alert and oriented, moves all 4 extremities.       Assessment & Plan:

## 2011-05-25 ENCOUNTER — Encounter: Payer: Self-pay | Admitting: Pulmonary Disease

## 2011-05-31 DIAGNOSIS — L0291 Cutaneous abscess, unspecified: Secondary | ICD-10-CM | POA: Diagnosis not present

## 2011-05-31 DIAGNOSIS — L02619 Cutaneous abscess of unspecified foot: Secondary | ICD-10-CM | POA: Diagnosis not present

## 2011-06-02 DIAGNOSIS — L03039 Cellulitis of unspecified toe: Secondary | ICD-10-CM | POA: Diagnosis not present

## 2011-08-30 DIAGNOSIS — I10 Essential (primary) hypertension: Secondary | ICD-10-CM | POA: Diagnosis not present

## 2011-08-30 DIAGNOSIS — E669 Obesity, unspecified: Secondary | ICD-10-CM | POA: Diagnosis not present

## 2011-08-30 DIAGNOSIS — J449 Chronic obstructive pulmonary disease, unspecified: Secondary | ICD-10-CM | POA: Diagnosis not present

## 2011-08-31 DIAGNOSIS — E781 Pure hyperglyceridemia: Secondary | ICD-10-CM | POA: Diagnosis not present

## 2011-08-31 DIAGNOSIS — E559 Vitamin D deficiency, unspecified: Secondary | ICD-10-CM | POA: Diagnosis not present

## 2011-09-08 ENCOUNTER — Inpatient Hospital Stay (HOSPITAL_COMMUNITY)
Admission: EM | Admit: 2011-09-08 | Discharge: 2011-09-12 | DRG: 193 | Disposition: A | Discharge status: Home / Self Care | Payer: Medicare Other | Attending: Internal Medicine | Admitting: Internal Medicine

## 2011-09-08 ENCOUNTER — Encounter (HOSPITAL_COMMUNITY): Payer: Self-pay | Admitting: *Deleted

## 2011-09-08 ENCOUNTER — Emergency Department (HOSPITAL_COMMUNITY): Payer: Medicare Other

## 2011-09-08 DIAGNOSIS — I498 Other specified cardiac arrhythmias: Secondary | ICD-10-CM | POA: Diagnosis present

## 2011-09-08 DIAGNOSIS — D72829 Elevated white blood cell count, unspecified: Secondary | ICD-10-CM | POA: Diagnosis present

## 2011-09-08 DIAGNOSIS — R918 Other nonspecific abnormal finding of lung field: Secondary | ICD-10-CM | POA: Diagnosis not present

## 2011-09-08 DIAGNOSIS — J45901 Unspecified asthma with (acute) exacerbation: Secondary | ICD-10-CM | POA: Diagnosis not present

## 2011-09-08 DIAGNOSIS — R51 Headache: Secondary | ICD-10-CM | POA: Diagnosis present

## 2011-09-08 DIAGNOSIS — J441 Chronic obstructive pulmonary disease with (acute) exacerbation: Secondary | ICD-10-CM | POA: Diagnosis present

## 2011-09-08 DIAGNOSIS — R062 Wheezing: Secondary | ICD-10-CM | POA: Diagnosis not present

## 2011-09-08 DIAGNOSIS — J189 Pneumonia, unspecified organism: Principal | ICD-10-CM | POA: Diagnosis present

## 2011-09-08 DIAGNOSIS — J9620 Acute and chronic respiratory failure, unspecified whether with hypoxia or hypercapnia: Secondary | ICD-10-CM | POA: Diagnosis present

## 2011-09-08 DIAGNOSIS — K219 Gastro-esophageal reflux disease without esophagitis: Secondary | ICD-10-CM | POA: Diagnosis present

## 2011-09-08 DIAGNOSIS — Z23 Encounter for immunization: Secondary | ICD-10-CM

## 2011-09-08 DIAGNOSIS — E876 Hypokalemia: Secondary | ICD-10-CM | POA: Diagnosis present

## 2011-09-08 DIAGNOSIS — Z87891 Personal history of nicotine dependence: Secondary | ICD-10-CM

## 2011-09-08 DIAGNOSIS — R0602 Shortness of breath: Secondary | ICD-10-CM | POA: Diagnosis not present

## 2011-09-08 HISTORY — DX: Diaphragmatic hernia without obstruction or gangrene: K44.9

## 2011-09-08 HISTORY — DX: Chronic respiratory failure, unspecified whether with hypoxia or hypercapnia: J96.10

## 2011-09-08 HISTORY — DX: Other psychoactive substance abuse, uncomplicated: F19.10

## 2011-09-08 HISTORY — DX: Gastro-esophageal reflux disease without esophagitis: K21.9

## 2011-09-08 HISTORY — DX: Chronic obstructive pulmonary disease, unspecified: J44.9

## 2011-09-08 LAB — BASIC METABOLIC PANEL
BUN: 9 mg/dL (ref 6–23)
Creatinine, Ser: 0.85 mg/dL (ref 0.50–1.10)
GFR calc Af Amer: 89 mL/min — ABNORMAL LOW (ref >90)
GFR calc non Af Amer: 77 mL/min — ABNORMAL LOW (ref >90)

## 2011-09-08 LAB — DIFFERENTIAL
Basophils Relative: 0 % (ref 0–1)
Eosinophils Absolute: 0 10*3/uL (ref 0.0–0.7)
Monocytes Absolute: 0.4 10*3/uL (ref 0.1–1.0)
Monocytes Relative: 3 % (ref 3–12)

## 2011-09-08 LAB — URINALYSIS, ROUTINE W REFLEX MICROSCOPIC
Bilirubin Urine: NEGATIVE
Hgb urine dipstick: NEGATIVE
Ketones, ur: NEGATIVE mg/dL
Nitrite: NEGATIVE
Urobilinogen, UA: 0.2 mg/dL (ref 0.0–1.0)
pH: 6 (ref 5.0–8.0)

## 2011-09-08 LAB — CBC
HCT: 37.4 % (ref 36.0–46.0)
Hemoglobin: 12.7 g/dL (ref 12.0–15.0)
MCH: 30.1 pg (ref 26.0–34.0)
MCHC: 34 g/dL (ref 30.0–36.0)
MCV: 88.6 fL (ref 78.0–100.0)

## 2011-09-08 MED ORDER — DEXTROSE 5 % IV SOLN
500.0000 mg | Freq: Once | INTRAVENOUS | Status: AC
  Administered 2011-09-08: 500 mg via INTRAVENOUS
  Filled 2011-09-08: qty 500

## 2011-09-08 MED ORDER — METHYLPREDNISOLONE SODIUM SUCC 125 MG IJ SOLR
INTRAMUSCULAR | Status: AC
  Administered 2011-09-08: 21:00:00
  Filled 2011-09-08: qty 2

## 2011-09-08 MED ORDER — DEXTROSE 5 % IV SOLN
1.0000 g | Freq: Once | INTRAVENOUS | Status: AC
  Administered 2011-09-08: 1 g via INTRAVENOUS
  Filled 2011-09-08: qty 10

## 2011-09-08 MED ORDER — IPRATROPIUM BROMIDE 0.02 % IN SOLN
RESPIRATORY_TRACT | Status: AC
  Administered 2011-09-08: 0.5 mg
  Filled 2011-09-08: qty 2.5

## 2011-09-08 MED ORDER — IPRATROPIUM BROMIDE 0.02 % IN SOLN
0.5000 mg | Freq: Once | RESPIRATORY_TRACT | Status: AC
  Administered 2011-09-08: 0.5 mg via RESPIRATORY_TRACT
  Filled 2011-09-08: qty 2.5

## 2011-09-08 MED ORDER — ALBUTEROL SULFATE (5 MG/ML) 0.5% IN NEBU
INHALATION_SOLUTION | RESPIRATORY_TRACT | Status: AC
  Administered 2011-09-08: 10 mg/h via RESPIRATORY_TRACT
  Filled 2011-09-08: qty 2

## 2011-09-08 MED ORDER — ALBUTEROL (5 MG/ML) CONTINUOUS INHALATION SOLN
10.0000 mg/h | INHALATION_SOLUTION | Freq: Once | RESPIRATORY_TRACT | Status: AC
  Administered 2011-09-08: 10 mg/h via RESPIRATORY_TRACT

## 2011-09-08 MED ORDER — ALBUTEROL SULFATE (5 MG/ML) 0.5% IN NEBU
INHALATION_SOLUTION | RESPIRATORY_TRACT | Status: AC
  Administered 2011-09-08: 10 mg
  Filled 2011-09-08: qty 2

## 2011-09-08 MED ORDER — MORPHINE SULFATE 4 MG/ML IJ SOLN
4.0000 mg | Freq: Once | INTRAMUSCULAR | Status: AC
  Administered 2011-09-08: 4 mg via INTRAVENOUS
  Filled 2011-09-08: qty 1

## 2011-09-08 NOTE — ED Notes (Signed)
Unable to obtain rectal temperature due to patient receiving  Breathing treatment.

## 2011-09-08 NOTE — ED Notes (Signed)
Per EMS:  Pt started becoming increasingly SOB since last night.  Pt has COPD, family got an oral temp of 103 at home.  Pt had 10mg  albuterol, 0.5 atrovent, and 125mg  of solu-medrol in route.  Pt alert and oriented.  EMS st's the right base was more diminished, breath sounds every where else categorized as wheezing after the breathing treatments.

## 2011-09-08 NOTE — ED Notes (Signed)
Ambulated patient to bathroom, 1 assist little assistance needed. Patient began wheezing. Oxygen level at 93% before leaving room. 87% after getting back into the room. Patient wheezing louder. Patient placed on 2L O2 via nasal cannula oxygen at 96%. HOB at 90 degrees. RN aware.

## 2011-09-08 NOTE — ED Notes (Signed)
Patient transported to X-ray 

## 2011-09-08 NOTE — ED Notes (Signed)
Pt back from x-ray.

## 2011-09-08 NOTE — ED Provider Notes (Addendum)
History     CSN: [id number]  Arrival date & time 09/08/11  2059   First MD Initiated Contact with Patient 09/08/11 2119      Chief Complaint  Patient presents with  . Shortness of Breath  . Fever    (Consider location/radiation/quality/duration/timing/severity/associated sxs/prior treatment) Patient is a 51 y.o. female presenting with shortness of breath. The history is provided by the patient.  Shortness of Breath  The current episode started yesterday. The onset was gradual. The problem occurs continuously. The problem has been gradually worsening. The problem is severe. The symptoms are relieved by nothing. The symptoms are aggravated by activity. Associated symptoms include a fever, cough, shortness of breath and wheezing. Pertinent negatives include no chest pressure. The cough is productive. She has had intermittent steroid use. She has had prior hospitalizations. She has had no prior ICU admissions. She has had no prior intubations. Her past medical history is significant for asthma and past wheezing. Urine output has been normal. There were no sick contacts. Recently, medical care has been given by EMS. Services received include medications given (Albuterol, Atrovent, Solu-Medrol).    Past Medical History  Diagnosis Date  . Chronic headache     Past Surgical History  Procedure Date  . Hernia repair 02/2009    Family History  Problem Relation Age of Onset  . Heart disease Father     History  Substance Use Topics  . Smoking status: Former Smoker -- 1.0 packs/day for 20 years    Types: Cigarettes    Quit date: 01/18/2009  . Smokeless tobacco: Never Used  . Alcohol Use: Yes    OB History    Grav Para Term Preterm Abortions TAB SAB Ect Mult Living                  Review of Systems  Constitutional: Positive for fever.  Respiratory: Positive for cough, shortness of breath and wheezing.   All other systems reviewed and are negative.    Allergies  Ibuprofen  and Tylenol  Home Medications   Current Outpatient Rx  Name Route Sig Dispense Refill  . ALBUTEROL SULFATE (2.5 MG/3ML) 0.083% IN NEBU Nebulization Take 2.5 mg by nebulization every 4 (four) hours as needed. For shortness of breath.    . BUDESONIDE-FORMOTEROL FUMARATE 160-4.5 MCG/ACT IN AERO Inhalation Inhale 2 puffs into the lungs 2 (two) times daily.      Marland Kitchen ALKA-SELTZER PLUS COLD PO Oral Take 1 tablet by mouth every 6 (six) hours as needed. For fever.    Marland Kitchen TIOTROPIUM BROMIDE MONOHYDRATE 18 MCG IN CAPS Inhalation Place 18 mcg into inhaler and inhale daily.        BP 125/75  Pulse 140  Temp(Src) 100.6 F (38.1 C) (Oral)  Resp 38  SpO2 97%  Physical Exam  Nursing note and vitals reviewed. Constitutional: She is oriented to person, place, and time. She appears well-developed and well-nourished. She appears distressed.  HENT:  Head: Normocephalic and atraumatic.  Right Ear: External ear normal.  Left Ear: External ear normal.  Mouth/Throat: Oropharynx is clear and moist.  Eyes: Conjunctivae and EOM are normal. Pupils are equal, round, and reactive to light. Right eye exhibits no discharge.  Neck: Normal range of motion. Neck supple.  Cardiovascular: Normal rate, regular rhythm, normal heart sounds and intact distal pulses.  Exam reveals no friction rub.   No murmur heard. Pulmonary/Chest: Accessory muscle usage present. Tachypnea noted. No respiratory distress. She has no decreased breath sounds. She has  wheezes. She has no rhonchi. She has no rales.  Abdominal: Soft. Bowel sounds are normal. She exhibits no distension. There is no tenderness. There is no rebound and no guarding.  Musculoskeletal: Normal range of motion. She exhibits no edema and no tenderness.       No edema  Neurological: She is alert and oriented to person, place, and time. No cranial nerve deficit.  Skin: Skin is warm and dry. No rash noted.  Psychiatric: She has a normal mood and affect. Her behavior is normal.     ED Course  Procedures (including critical care time)  Labs Reviewed  CBC - Abnormal; Notable for the following:    WBC 12.4 (*)    All other components within normal limits  DIFFERENTIAL - Abnormal; Notable for the following:    Neutrophils Relative 87 (*)    Neutro Abs 10.8 (*)    Lymphocytes Relative 9 (*)    All other components within normal limits  BASIC METABOLIC PANEL - Abnormal; Notable for the following:    Potassium 3.4 (*)    Glucose, Bld 157 (*)    GFR calc non Af Amer 77 (*)    GFR calc Af Amer 89 (*)    All other components within normal limits  URINALYSIS, ROUTINE W REFLEX MICROSCOPIC   Dg Chest 2 View  09/08/2011  *RADIOLOGY REPORT*  Clinical Data: Shortness of breath and wheezing.  Former smoker.  CHEST - 2 VIEW  Comparison: 01/13/2011  Findings: There is developing linear atelectasis or infiltration in the left lung base.  Normal heart size and pulmonary vascularity. Emphysematous changes and scattered fibrosis in the lungs.  No blunting of costophrenic angles.  No pneumothorax. Old right rib fractures.  IMPRESSION: Developing linear atelectasis or infiltration in the left lung base since previous study.  Original Report Authenticated By: Marlon Pel, M.D.    Date: 09/08/2011  Rate: 135  Rhythm: sinus tachycardia  QRS Axis: normal  Intervals: normal  ST/T Wave abnormalities: nonspecific T wave changes  Conduction Disutrbances:none  Narrative Interpretation:   Old EKG Reviewed: unchanged    1. CAP (community acquired pneumonia)   2. Asthma       MDM   Patient with a history of severe asthma has been having worsening shortness of breath, productive cough and fever for the last 24-48 hours. When EMS arrived patient was using accessory muscles and satting below 90% on room air. She was given albuterol Atrovent and Solu-Medrol prior to arrival. On arrival here patient was febrile with a pulse of 140 respiratory rate 38. She was using accessory  muscles but that did improve after an hour-long. Chest x-ray showing a new infiltrate when compared to prior films in with fever and productive cough feel patient has community acquired pneumonia. She was treated with Rocephin and azithromycin. We will admit for further care.      Gwyneth Sprout, MD 09/08/11 1610  Gwyneth Sprout, MD 09/09/11 0000

## 2011-09-08 NOTE — ED Notes (Signed)
EDP Plunkett in with patient

## 2011-09-08 NOTE — ED Notes (Signed)
ZOX:[id number]<AV> Expected date:09/08/11<BR> Expected time:<BR> Means of arrival:<BR> Comments:<BR> EMS 211 GC - sob/cough/fever

## 2011-09-09 ENCOUNTER — Encounter (HOSPITAL_COMMUNITY): Payer: Self-pay | Admitting: Internal Medicine

## 2011-09-09 ENCOUNTER — Inpatient Hospital Stay (HOSPITAL_COMMUNITY): Payer: Medicare Other

## 2011-09-09 DIAGNOSIS — I498 Other specified cardiac arrhythmias: Secondary | ICD-10-CM | POA: Diagnosis present

## 2011-09-09 DIAGNOSIS — J9620 Acute and chronic respiratory failure, unspecified whether with hypoxia or hypercapnia: Secondary | ICD-10-CM | POA: Diagnosis present

## 2011-09-09 DIAGNOSIS — K219 Gastro-esophageal reflux disease without esophagitis: Secondary | ICD-10-CM | POA: Diagnosis present

## 2011-09-09 DIAGNOSIS — J441 Chronic obstructive pulmonary disease with (acute) exacerbation: Secondary | ICD-10-CM | POA: Diagnosis not present

## 2011-09-09 DIAGNOSIS — D72829 Elevated white blood cell count, unspecified: Secondary | ICD-10-CM | POA: Diagnosis present

## 2011-09-09 DIAGNOSIS — J189 Pneumonia, unspecified organism: Secondary | ICD-10-CM

## 2011-09-09 DIAGNOSIS — J159 Unspecified bacterial pneumonia: Secondary | ICD-10-CM | POA: Diagnosis not present

## 2011-09-09 DIAGNOSIS — J45901 Unspecified asthma with (acute) exacerbation: Secondary | ICD-10-CM | POA: Diagnosis present

## 2011-09-09 DIAGNOSIS — J438 Other emphysema: Secondary | ICD-10-CM | POA: Diagnosis not present

## 2011-09-09 DIAGNOSIS — Z23 Encounter for immunization: Secondary | ICD-10-CM | POA: Diagnosis not present

## 2011-09-09 DIAGNOSIS — J9600 Acute respiratory failure, unspecified whether with hypoxia or hypercapnia: Secondary | ICD-10-CM | POA: Diagnosis not present

## 2011-09-09 DIAGNOSIS — R509 Fever, unspecified: Secondary | ICD-10-CM | POA: Diagnosis not present

## 2011-09-09 DIAGNOSIS — R51 Headache: Secondary | ICD-10-CM | POA: Diagnosis present

## 2011-09-09 DIAGNOSIS — I10 Essential (primary) hypertension: Secondary | ICD-10-CM | POA: Diagnosis not present

## 2011-09-09 DIAGNOSIS — F17200 Nicotine dependence, unspecified, uncomplicated: Secondary | ICD-10-CM | POA: Diagnosis not present

## 2011-09-09 DIAGNOSIS — R0602 Shortness of breath: Secondary | ICD-10-CM | POA: Diagnosis not present

## 2011-09-09 DIAGNOSIS — R918 Other nonspecific abnormal finding of lung field: Secondary | ICD-10-CM | POA: Diagnosis not present

## 2011-09-09 DIAGNOSIS — Z87891 Personal history of nicotine dependence: Secondary | ICD-10-CM | POA: Diagnosis not present

## 2011-09-09 DIAGNOSIS — E876 Hypokalemia: Secondary | ICD-10-CM

## 2011-09-09 HISTORY — DX: Pneumonia, unspecified organism: J18.9

## 2011-09-09 HISTORY — DX: Hypokalemia: E87.6

## 2011-09-09 LAB — COMPREHENSIVE METABOLIC PANEL
Albumin: 3.6 g/dL (ref 3.5–5.2)
BUN: 8 mg/dL (ref 6–23)
Calcium: 9 mg/dL (ref 8.4–10.5)
Creatinine, Ser: 0.72 mg/dL (ref 0.50–1.10)
Total Protein: 7.3 g/dL (ref 6.0–8.3)

## 2011-09-09 LAB — CBC
HCT: 35.8 % — ABNORMAL LOW (ref 36.0–46.0)
MCV: 88.8 fL (ref 78.0–100.0)
RBC: 4.03 MIL/uL (ref 3.87–5.11)
WBC: 14.4 10*3/uL — ABNORMAL HIGH (ref 4.0–10.5)

## 2011-09-09 MED ORDER — IOHEXOL 300 MG/ML  SOLN
100.0000 mL | Freq: Once | INTRAMUSCULAR | Status: AC | PRN
  Administered 2011-09-09: 100 mL via INTRAVENOUS

## 2011-09-09 MED ORDER — ALBUTEROL SULFATE (5 MG/ML) 0.5% IN NEBU
2.5000 mg | INHALATION_SOLUTION | RESPIRATORY_TRACT | Status: DC
  Administered 2011-09-09 – 2011-09-12 (×18): 2.5 mg via RESPIRATORY_TRACT
  Filled 2011-09-09 (×19): qty 0.5

## 2011-09-09 MED ORDER — METHYLPREDNISOLONE SODIUM SUCC 125 MG IJ SOLR
60.0000 mg | Freq: Two times a day (BID) | INTRAMUSCULAR | Status: DC
  Administered 2011-09-09 (×2): 60 mg via INTRAVENOUS
  Filled 2011-09-09: qty 0.96
  Filled 2011-09-09: qty 2
  Filled 2011-09-09: qty 0.96

## 2011-09-09 MED ORDER — ALBUTEROL SULFATE (5 MG/ML) 0.5% IN NEBU
2.5000 mg | INHALATION_SOLUTION | RESPIRATORY_TRACT | Status: AC | PRN
  Administered 2011-09-09: 2.5 mg via RESPIRATORY_TRACT
  Filled 2011-09-09: qty 0.5

## 2011-09-09 MED ORDER — OXYCODONE-ACETAMINOPHEN 5-325 MG PO TABS
1.0000 | ORAL_TABLET | Freq: Four times a day (QID) | ORAL | Status: DC | PRN
  Administered 2011-09-10: 1 via ORAL
  Filled 2011-09-09: qty 1

## 2011-09-09 MED ORDER — ALBUTEROL SULFATE (5 MG/ML) 0.5% IN NEBU: 2.5000 mg | INHALATION_SOLUTION | RESPIRATORY_TRACT | Status: DC | PRN

## 2011-09-09 MED ORDER — METHYLPREDNISOLONE SODIUM SUCC 125 MG IJ SOLR
60.0000 mg | Freq: Four times a day (QID) | INTRAMUSCULAR | Status: DC
  Administered 2011-09-09 – 2011-09-12 (×11): 60 mg via INTRAVENOUS
  Filled 2011-09-09 (×15): qty 0.96

## 2011-09-09 MED ORDER — TIOTROPIUM BROMIDE MONOHYDRATE 18 MCG IN CAPS
18.0000 ug | ORAL_CAPSULE | Freq: Every day | RESPIRATORY_TRACT | Status: DC
  Administered 2011-09-09 – 2011-09-12 (×4): 18 ug via RESPIRATORY_TRACT
  Filled 2011-09-09: qty 5

## 2011-09-09 MED ORDER — POTASSIUM CHLORIDE 10 MEQ/100ML IV SOLN
10.0000 meq | INTRAVENOUS | Status: AC
  Administered 2011-09-09 (×2): 10 meq via INTRAVENOUS
  Filled 2011-09-09 (×2): qty 100

## 2011-09-09 MED ORDER — DEXTROSE 5 % IV SOLN
500.0000 mg | INTRAVENOUS | Status: DC
  Administered 2011-09-09 – 2011-09-10 (×2): 500 mg via INTRAVENOUS
  Filled 2011-09-09 (×2): qty 500

## 2011-09-09 MED ORDER — ENOXAPARIN SODIUM 30 MG/0.3ML ~~LOC~~ SOLN
30.0000 mg | SUBCUTANEOUS | Status: DC
  Administered 2011-09-09 – 2011-09-10 (×2): 30 mg via SUBCUTANEOUS
  Filled 2011-09-09 (×3): qty 0.3

## 2011-09-09 MED ORDER — IPRATROPIUM BROMIDE 0.02 % IN SOLN: 0.5000 mg | RESPIRATORY_TRACT | Status: DC | PRN

## 2011-09-09 MED ORDER — OXYCODONE HCL 5 MG PO TABS
5.0000 mg | ORAL_TABLET | Freq: Once | ORAL | Status: AC
  Administered 2011-09-09: 5 mg via ORAL
  Filled 2011-09-09: qty 1

## 2011-09-09 MED ORDER — DEXTROSE 5 % IV SOLN
1.0000 g | INTRAVENOUS | Status: DC
  Administered 2011-09-09 – 2011-09-10 (×2): 1 g via INTRAVENOUS
  Filled 2011-09-09 (×2): qty 10

## 2011-09-09 MED ORDER — SODIUM CHLORIDE 0.9 % IV SOLN
INTRAVENOUS | Status: AC
  Administered 2011-09-09: 07:00:00 via INTRAVENOUS

## 2011-09-09 MED ORDER — IPRATROPIUM BROMIDE 0.02 % IN SOLN: 0.5000 mg | RESPIRATORY_TRACT | Status: DC

## 2011-09-09 NOTE — Progress Notes (Signed)
Subjective: Ambulating in room. Moderate SOB. Gait steady. Reports little improvement with SOB. Reports "side pain when i cough"  Objective: Vital signs Filed Vitals:   09/09/11 0615 09/09/11 0944 09/09/11 1105 09/09/11 1405  BP: 111/76   112/74  Pulse: 84   98  Temp: 97.4 F (36.3 C)   98.3 F (36.8 C)  TempSrc: Oral   Oral  Resp: 20   22  Height:      Weight:      SpO2: 97% 92% 97% 93%   Weight change:  Last BM Date: 09/07/11  Intake/Output from previous day: 04/25 0701 - 04/26 0700 In: 720 [P.O.:120; I.V.:500; IV Piggyback:100] Out: -  Total I/O In: 360 [P.O.:360] Out: -    Physical Exam: General: Alert, awake, oriented x3. Well nourished HEENT: No bruits, no goiter. Mucus membranes moist/pink. PERRL Heart: Regular rate and rhythm, without murmurs, rubs, gallops. Trace LEE Lungs: Moderate increased work of breathing. Prolonged expiratory phase. Breath sounds distant,  course throughout with faint exp wheeze. . Abdomen:  Obese, Soft, nontender, nondistended, positive bowel sounds. Extremities: No clubbing cyanosis  with positive pedal pulses. MOE. Full ROM Neuro: Grossly intact, nonfocal.    Lab Results: Basic Metabolic Panel:  Basename 09/09/11 0455 09/08/11 2228  NA 138 138  K 3.8 3.4*  CL 102 100  CO2 25 27  GLUCOSE 167* 157*  BUN 8 9  CREATININE 0.72 0.85  CALCIUM 9.0 9.4  MG -- --  PHOS -- --   Liver Function Tests:  Basename 09/09/11 0455  AST 17  ALT 18  ALKPHOS 76  BILITOT 0.3  PROT 7.3  ALBUMIN 3.6   No results found for this basename: LIPASE:2,AMYLASE:2 in the last 72 hours No results found for this basename: AMMONIA:2 in the last 72 hours CBC:  Basename 09/09/11 0455 09/08/11 2228  WBC 14.4* 12.4*  NEUTROABS -- 10.8*  HGB 11.9* 12.7  HCT 35.8* 37.4  MCV 88.8 88.6  PLT 233 243   Cardiac Enzymes: No results found for this basename: CKTOTAL:3,CKMB:3,CKMBINDEX:3,TROPONINI:3 in the last 72 hours BNP: No results found for this  basename: PROBNP:3 in the last 72 hours D-Dimer: No results found for this basename: DDIMER:2 in the last 72 hours CBG: No results found for this basename: GLUCAP:6 in the last 72 hours Hemoglobin A1C: No results found for this basename: HGBA1C in the last 72 hours Fasting Lipid Panel: No results found for this basename: CHOL,HDL,LDLCALC,TRIG,CHOLHDL,LDLDIRECT in the last 72 hours Thyroid Function Tests: No results found for this basename: TSH,T4TOTAL,FREET4,T3FREE,THYROIDAB in the last 72 hours Anemia Panel: No results found for this basename: VITAMINB12,FOLATE,FERRITIN,TIBC,IRON,RETICCTPCT in the last 72 hours Coagulation: No results found for this basename: LABPROT:2,INR:2 in the last 72 hours Urine Drug Screen: Drugs of Abuse     Component Value Date/Time   LABOPIA NONE DETECTED 06/16/2009 0544   COCAINSCRNUR NONE DETECTED 06/16/2009 0544   LABBENZ NONE DETECTED 06/16/2009 0544   AMPHETMU NONE DETECTED 06/16/2009 0544   THCU NONE DETECTED 06/16/2009 0544   LABBARB  Value: NONE DETECTED        DRUG SCREEN FOR MEDICAL PURPOSES ONLY.  IF CONFIRMATION IS NEEDED FOR ANY PURPOSE, NOTIFY LAB WITHIN 5 DAYS.        LOWEST DETECTABLE LIMITS FOR URINE DRUG SCREEN Drug Class       Cutoff (ng/mL) Amphetamine      1000 Barbiturate      200 Benzodiazepine   200 Tricyclics       300 Opiates  300 Cocaine          300 THC              50 06/16/2009 0544    Alcohol Level: No results found for this basename: ETH:2 in the last 72 hours Urinalysis:  Basename 09/08/11 2253  COLORURINE YELLOW  LABSPEC 1.011  PHURINE 6.0  GLUCOSEU NEGATIVE  HGBUR NEGATIVE  BILIRUBINUR NEGATIVE  KETONESUR NEGATIVE  PROTEINUR NEGATIVE  UROBILINOGEN 0.2  NITRITE NEGATIVE  LEUKOCYTESUR NEGATIVE   Misc. Labs:  No results found for this or any previous visit (from the past 240 hour(s)).  Studies/Results: Dg Chest 2 View  09/08/2011  *RADIOLOGY REPORT*  Clinical Data: Shortness of breath and wheezing.  Former  smoker.  CHEST - 2 VIEW  Comparison: 01/13/2011  Findings: There is developing linear atelectasis or infiltration in the left lung base.  Normal heart size and pulmonary vascularity. Emphysematous changes and scattered fibrosis in the lungs.  No blunting of costophrenic angles.  No pneumothorax. Old right rib fractures.  IMPRESSION: Developing linear atelectasis or infiltration in the left lung base since previous study.  Original Report Authenticated By: Marlon Pel, M.D.   Ct Angio Chest W/cm &/or Wo Cm  09/09/2011  *RADIOLOGY REPORT*  Clinical Data: Fever, productive cough, shortness of breath and wheezing for 2 days.  Smoker.  CT ANGIOGRAPHY CHEST  Technique:  Multidetector CT imaging of the chest using the standard protocol during bolus administration of intravenous contrast. Multiplanar reconstructed images including MIPs were obtained and reviewed to evaluate the vascular anatomy.  Contrast: OMNIPAQUE IOHEXOL 300 MG/ML  SOLN  Comparison: 08/23/2006  Findings: Technically adequate study with good opacification of the central and segmental pulmonary arteries.  No focal filling defect. No evidence of significant pulmonary embolus.  Focal consolidation or atelectasis in the right middle lung inferiorly and in both lung bases.  No pleural effusions.  Scattered emphysematous changes.  No pneumothorax.  Normal caliber thoracic aorta.  No significant lymphadenopathy in the chest.  Scattered coronary artery calcifications.  Mild degenerative changes in the thoracic spine. The  IMPRESSION: No evidence of significant pulmonary embolus.  Focal consolidation or atelectasis in the right middle lung and both lung bases. Consider pneumonia.  Scattered emphysematous changes.  Original Report Authenticated By: Marlon Pel, M.D.    Medications: Scheduled Meds:    . sodium chloride   Intravenous STAT  . albuterol  2.5 mg Nebulization Q4H  . albuterol      . albuterol  10 mg/hr Nebulization Once    . azithromycin  500 mg Intravenous Once  . azithromycin  500 mg Intravenous Q24H  . cefTRIAXone (ROCEPHIN)  IV  1 g Intravenous Once  . cefTRIAXone (ROCEPHIN)  IV  1 g Intravenous Q24H  . enoxaparin  30 mg Subcutaneous Q24H  . ipratropium      . ipratropium  0.5 mg Nebulization Once  . ipratropium  0.5 mg Nebulization Q4H  . methylPREDNISolone (SOLU-MEDROL) injection  60 mg Intravenous Q12H  . methylPREDNISolone sodium succinate      .  morphine injection  4 mg Intravenous Once  . oxyCODONE  5 mg Oral Once  . potassium chloride  10 mEq Intravenous Q1 Hr x 2  . tiotropium  18 mcg Inhalation Daily   Continuous Infusions:  PRN Meds:.albuterol, albuterol, iohexol, ipratropium, oxyCODONE-acetaminophen  Assessment/Plan:  Principal Problem:  *Acute asthma exacerbation Active Problems:  Community acquired pneumonia  Hypokalemia  Leukocytosis  Acute-on-chronic respiratory failure   Acute asthma  exacerbation with COPD  -  Little improvement. Will continue nebulizer treatments every4 hours scheduled and 2 hrs prn and increase steroids. - O2 saturation currently 93-97 on nasala canula 2 L  - Continue spiriva . Blood cultures and sputum culture pending. Azithromycin and ceftriaxome day #2. See problem #2 .  CT chest angio neg for PE, somewhat concerning for pna. See problem #2   Acute on chronic respiratory failure due to #1.  Pt sees Dr. Shelle Iron. On home O2 for exertional activity. Recommended pulmonary rehab last visit but pt unable to afford. Will attempt to wean o2 to pts. Baseline once #1 resolved. If no improvement consider pulmonary consult. See treatment problem #1.   Community acquired pneumonia  - continue azithromycin and ceftriaxone day#2 -strep pneumo Ag pending. Afebrile, non-toxic appearing   Hypokalemia  - resolved. Will monitor closely   Leukocytosis  - likley secondary to pneumonia . Trending upward likely as a result of solumedrol. Antibiotics as above.  Pt  afebrile and non-toxic. Will monitor closely.      LOS: 1 day   [facility] M 09/09/2011, 2:44 PM  Patient seen and examined. Admitted earlier today. Agree with above note with changes. Continue current treatment for now.  Denijah Karrer 3:11 PM

## 2011-09-09 NOTE — Progress Notes (Signed)
   CARE MANAGEMENT NOTE 09/09/2011  Patient:  [name],[name]   Account Number:  [id number]  Date Initiated:  09/09/2011  Documentation initiated by:  Jiles Crocker  Subjective/Objective Assessment:   ADMITTED WITH PNEUMONIA     Action/Plan:   PCP IS DR Parke Simmers; LIVES WITH FAMILY   Anticipated DC Date:  09/16/2011   Anticipated DC Plan:  HOME/SELF CARE           Status of service:  In process, will continue to follow Medicare Important Message given?  NA - LOS <3 / Initial given by admissions (If response is "NO", the following Medicare IM given date fields will be blank)  Per UR Regulation:  Reviewed for med. necessity/level of care/duration of stay  Comments:  09/09/2011- B Jazia Faraci RN, BSN, MHA

## 2011-09-09 NOTE — ED Notes (Signed)
Attempted to call report. Floor RN unable to accept report.  

## 2011-09-09 NOTE — ED Notes (Signed)
Pt back from CT

## 2011-09-09 NOTE — Evaluation (Signed)
Physical Therapy Evaluation Patient Details Name: [name] MRN: [id number] DOB: 17-Aug-1960 Today's Date: 09/09/2011 Time: 4098-1191 PT Time Calculation (min): 18 min  PT Assessment / Plan / Recommendation Clinical Impression  pt admitted w/ SOB, possible pneumonia, pt will benefit from PT to improve endurance and independence to DC to home.    PT Assessment  Patient needs continued PT services    Follow Up Recommendations  Home health PT for energy conservation and endurance. Pt may not need HH bt DC    Equipment Recommendations  None recommended by PT    Frequency Min 3X/week    Precautions / Restrictions Precautions Precaution Comments: decreasesd sats   Pertinent Vitals/Pain      Mobility  Bed Mobility Bed Mobility: Supine to Sit;Sit to Supine Supine to Sit: 7: Independent Sit to Supine: 7: Independent Transfers Transfers: Sit to Stand;Stand to Sit Sit to Stand: 7: Independent Stand to Sit: 7: Independent Details for Transfer Assistance: pt requird no assistance Ambulation/Gait Ambulation Distance (Feet): 50 Feet (x 2 in room) Ambulation/Gait Assistance Details: pt is mobile w/ O2 in place but is limited by DOE Gait Pattern: Within Functional Limits Gait velocity: slow, resting    Exercises     PT Goals Acute Rehab PT Goals PT Goal Formulation: With patient Time For Goal Achievement: 09/23/11 Potential to Achieve Goals: Good Pt will Ambulate: >150 feet;Independently (sats will remain > 90%) PT Goal: Ambulate - Progress: Goal set today Pt will Go Up / Down Stairs: 1-2 stairs;with supervision PT Goal: Up/Down Stairs - Progress: Goal set today  Visit Information  Last PT Received On: 09/09/11 Assistance Needed: +1    Subjective Data  Subjective: i've been getting u[p to wash and to go to the bathroom. Patient Stated Goal: i want to go home   Prior Functioning  Home Living Lives With: Family;Son Available Help at Discharge: Family Type of Home:  House Home Access: Stairs to enter Secretary/administrator of Steps: 2 Entrance Stairs-Rails: None Home Layout: One level Bathroom Shower/Tub: Network engineer: None Additional Comments: has home O2 Prior Function Level of Independence: Independent Able to Take Stairs?: Yes Comments: pt reorts she could walk outside off of RA around complex which sounds like quite a distance Communication Communication: No difficulties    Cognition  Overall Cognitive Status: Appears within functional limits for tasks assessed/performed Arousal/Alertness: Awake/alert Orientation Level: Appears intact for tasks assessed Behavior During Session: [facility] for tasks performed    Extremity/Trunk Assessment Right Upper Extremity Assessment RUE ROM/Strength/Tone: Within functional levels RUE Coordination:  (noted tremors) Left Upper Extremity Assessment LUE ROM/Strength/Tone: Within functional levels LUE Coordination:  (noted tremors) Right Lower Extremity Assessment RLE ROM/Strength/Tone: WFL for tasks assessed RLE Sensation: WFL - Light Touch Left Lower Extremity Assessment LLE ROM/Strength/Tone: WFL for tasks assessed LLE Sensation: WFL - Light Touch Trunk Assessment Trunk Assessment: Normal   Balance    End of Session PT - End of Session Activity Tolerance: Treatment limited secondary to medical complications (Comment) (limited by decreased sats, SOB) Patient left: in bed;with call bell/phone within reach;with family/visitor present Nurse Communication: Mobility status   Rada Hay 09/09/2011, 4:34 PM  [phone]

## 2011-09-09 NOTE — ED Notes (Signed)
Patient transported to CT 

## 2011-09-09 NOTE — ED Notes (Signed)
Patient transported to X-ray. Will get labs when pt returns  

## 2011-09-09 NOTE — H&P (Signed)
PCP:  Geraldo Pitter, MD, MD   DOA:  09/08/2011  9:17 PM  Chief Complaint:  Shortness of breath  HPI: 51 year old female with history of COPD, asthma presented to ED with complaints of worsening shortness of breath over last couple of days prior to admission associated with productive whitish to yellowish cough and fever of 100.8Fat home. Patient has no complaints of chest pain, no night sweats, no nausea or vomiting and no diarrhea. No complaints of abdominal pain, no reports of blood in stool or urine,   Assessment/Plan  Principal Problem:   *Acute asthma exacerbation with COPD - we will continue nebulizer treatments every 2 hours as needed for wheezing and/or shortness of breath - keep O2 saturation above 90% via nasala canula 2 L - start solumedrol 60 mg Q 12 hours IV - may continue spiriva - follow up blood cultures and sputum culture - start antibiotics for possible pneumonia - vital signs reveal tachycardia, dyspnea and desaturation to 80's; we will order CT chest angio to rule out pulmonary embolism  Active Problems:   Community acquired pneumonia - start azithromycin and ceftriaxone - follow up strep pneumo Ag - follow up blood cultures and respiratory culture   Hypokalemia - replete - follow up BMP in am   Leukocytosis - likley secondary to pneumonia - management as above - dollow up CBC in am  DVT Prophylaxis - lovenox sub Q  Code Status - full code  Education  - test results and diagnostic studies were discussed with patient  - patient verbalized the understanding   Allergies: Allergies  Allergen Reactions  . Ibuprofen   . Tylenol (Acetaminophen)     Prior to Admission medications   Medication Sig Start Date End Date Taking? Authorizing Provider  albuterol (PROVENTIL) (2.5 MG/3ML) 0.083% nebulizer solution Take 2.5 mg by nebulization every 4 (four) hours as needed. For shortness of breath.   Yes Historical Provider, MD  budesonide-formoterol  (SYMBICORT) 160-4.5 MCG/ACT inhaler Inhale 2 puffs into the lungs 2 (two) times daily.     Yes Historical Provider, MD  Chlorphen-Phenyleph-ASA (ALKA-SELTZER PLUS COLD PO) Take 1 tablet by mouth every 6 (six) hours as needed. For fever.   Yes Historical Provider, MD  tiotropium (SPIRIVA) 18 MCG inhalation capsule Place 18 mcg into inhaler and inhale daily.     Yes Historical Provider, MD    Past Medical History  Diagnosis Date  . Chronic headache     Past Surgical History  Procedure Date  . Hernia repair 02/2009    Social History:  reports that she quit smoking about 2 years ago. Her smoking use included Cigarettes. She has a 20 pack-year smoking history. She has never used smokeless tobacco. She reports that she drinks alcohol. She reports that she does not use illicit drugs.  Family History  Problem Relation Age of Onset  . Heart disease Father     Review of Systems:  Constitutional: Denies fever, chills, diaphoresis, appetite change and fatigue.  HEENT: Denies photophobia, eye pain, redness, hearing loss, ear pain, congestion, sore throat, rhinorrhea, sneezing, mouth sores, trouble swallowing, neck pain, neck stiffness and tinnitus.   Respiratory: per HPI.   Cardiovascular: Denies chest pain, palpitations and leg swelling.  Gastrointestinal: Denies nausea, vomiting, abdominal pain, diarrhea, constipation, blood in stool and abdominal distention.  Genitourinary: Denies dysuria, urgency, frequency, hematuria, flank pain and difficulty urinating.  Musculoskeletal: Denies myalgias, back pain, joint swelling, arthralgias and gait problem.  Skin: Denies pallor, rash and wound.  Neurological: Denies dizziness, seizures, syncope, weakness, light-headedness, numbness and headaches.  Hematological: Denies adenopathy. Easy bruising, personal or family bleeding history  Psychiatric/Behavioral: Denies suicidal ideation, mood changes, confusion, nervousness, sleep disturbance and  agitation   Physical Exam:  Filed Vitals:   09/08/11 2127 09/08/11 2143 09/08/11 2251 09/08/11 2329  BP:    113/79  Pulse:    109  Temp:   100.7 F (38.2 C) 98.8 F (37.1 C)  TempSrc:   Rectal Oral  Resp:    22  SpO2: 95% 87%  94%    Constitutional: Vital signs reviewed.  Patient is in no acute distress and cooperative with exam. Alert and oriented x3.  Head: Normocephalic and atraumatic Ear: TM normal bilaterally Mouth: no erythema or exudates, MMM Eyes: PERRL, EOMI, conjunctivae normal, No scleral icterus.  Neck: Supple, Trachea midline normal ROM, No JVD, mass, thyromegaly, or carotid bruit present.  Cardiovascular: RRR, S1 normal, S2 normal, no MRG, pulses symmetric and intact bilaterally Pulmonary/Chest: wheezing and somewhat diminished breath sounds bilaterally Abdominal: Soft. Non-tender, non-distended, bowel sounds are normal, no masses, organomegaly, or guarding present.  GU: no CVA tenderness Musculoskeletal: No joint deformities, erythema, or stiffness, ROM full and no nontender Ext: (+1) LE  edema and no cyanosis, pulses palpable bilaterally (DP and PT) Hematology: no cervical, inginal, or axillary adenopathy.  Neurological: A&O x3, Strenght is normal and symmetric bilaterally, cranial nerve II-XII are grossly intact, no focal motor deficit, sensory intact to light touch bilaterally.  Skin: Warm, dry and intact. No rash, cyanosis, or clubbing.  Psychiatric: Normal mood and affect. speech and behavior is normal. Judgment and thought content normal. Cognition and memory are normal.   Labs on Admission:  Results for orders placed during the hospital encounter of 09/08/11 (from the past 48 hour(s))  CBC     Status: Abnormal   Collection Time   09/08/11 10:28 PM      Component Value Range Comment   WBC 12.4 (*) 4.0 - 10.5 (K/uL)    RBC 4.22  3.87 - 5.11 (MIL/uL)    Hemoglobin 12.7  12.0 - 15.0 (g/dL)    HCT 16.1  09.6 - 04.5 (%)    MCV 88.6  78.0 - 100.0 (fL)    MCH  30.1  26.0 - 34.0 (pg)    MCHC 34.0  30.0 - 36.0 (g/dL)    RDW 40.9  81.1 - 91.4 (%)    Platelets 243  150 - 400 (K/uL)   DIFFERENTIAL     Status: Abnormal   Collection Time   09/08/11 10:28 PM      Component Value Range Comment   Neutrophils Relative 87 (*) 43 - 77 (%)    Neutro Abs 10.8 (*) 1.7 - 7.7 (K/uL)    Lymphocytes Relative 9 (*) 12 - 46 (%)    Lymphs Abs 1.2  0.7 - 4.0 (K/uL)    Monocytes Relative 3  3 - 12 (%)    Monocytes Absolute 0.4  0.1 - 1.0 (K/uL)    Eosinophils Relative 0  0 - 5 (%)    Eosinophils Absolute 0.0  0.0 - 0.7 (K/uL)    Basophils Relative 0  0 - 1 (%)    Basophils Absolute 0.0  0.0 - 0.1 (K/uL)   BASIC METABOLIC PANEL     Status: Abnormal   Collection Time   09/08/11 10:28 PM      Component Value Range Comment   Sodium 138  135 - 145 (mEq/L)  Potassium 3.4 (*) 3.5 - 5.1 (mEq/L)    Chloride 100  96 - 112 (mEq/L)    CO2 27  19 - 32 (mEq/L)    Glucose, Bld 157 (*) 70 - 99 (mg/dL)    BUN 9  6 - 23 (mg/dL)    Creatinine, Ser 0.98  0.50 - 1.10 (mg/dL)    Calcium 9.4  8.4 - 10.5 (mg/dL)    GFR calc non Af Amer 77 (*) >90 (mL/min)    GFR calc Af Amer 89 (*) >90 (mL/min)   URINALYSIS, ROUTINE W REFLEX MICROSCOPIC     Status: Normal   Collection Time   09/08/11 10:53 PM      Component Value Range Comment   Color, Urine YELLOW  YELLOW     APPearance CLEAR  CLEAR     Specific Gravity, Urine 1.011  1.005 - 1.030     pH 6.0  5.0 - 8.0     Glucose, UA NEGATIVE  NEGATIVE (mg/dL)    Hgb urine dipstick NEGATIVE  NEGATIVE     Bilirubin Urine NEGATIVE  NEGATIVE     Ketones, ur NEGATIVE  NEGATIVE (mg/dL)    Protein, ur NEGATIVE  NEGATIVE (mg/dL)    Urobilinogen, UA 0.2  0.0 - 1.0 (mg/dL)    Nitrite NEGATIVE  NEGATIVE     Leukocytes, UA NEGATIVE  NEGATIVE  MICROSCOPIC NOT DONE ON URINES WITH NEGATIVE PROTEIN, BLOOD, LEUKOCYTES, NITRITE, OR GLUCOSE <1000 mg/dL.    Radiological Exams on Admission: No results found.  - questions were answered at the bedside and  contact information was provided for additional questions or concerns  Time Spent on Admission: Over 30 minutes  Nimai Burbach 09/09/2011, 12:23 AM  [facility] Pager # [phone] Main Office # [phone]

## 2011-09-10 DIAGNOSIS — J441 Chronic obstructive pulmonary disease with (acute) exacerbation: Secondary | ICD-10-CM | POA: Diagnosis not present

## 2011-09-10 DIAGNOSIS — J159 Unspecified bacterial pneumonia: Secondary | ICD-10-CM | POA: Diagnosis not present

## 2011-09-10 DIAGNOSIS — E876 Hypokalemia: Secondary | ICD-10-CM | POA: Diagnosis not present

## 2011-09-10 DIAGNOSIS — J9600 Acute respiratory failure, unspecified whether with hypoxia or hypercapnia: Secondary | ICD-10-CM | POA: Diagnosis not present

## 2011-09-10 LAB — CBC
HCT: 34.5 % — ABNORMAL LOW (ref 36.0–46.0)
Hemoglobin: 11.2 g/dL — ABNORMAL LOW (ref 12.0–15.0)
MCH: 29.1 pg (ref 26.0–34.0)
MCHC: 32.5 g/dL (ref 30.0–36.0)

## 2011-09-10 LAB — BASIC METABOLIC PANEL
BUN: 13 mg/dL (ref 6–23)
CO2: 31 mEq/L (ref 19–32)
Calcium: 9 mg/dL (ref 8.4–10.5)
GFR calc non Af Amer: >90 mL/min (ref >90)
Glucose, Bld: 165 mg/dL — ABNORMAL HIGH (ref 70–99)
Potassium: 3.9 mEq/L (ref 3.5–5.1)

## 2011-09-10 MED ORDER — PNEUMOCOCCAL VAC POLYVALENT 25 MCG/0.5ML IJ INJ
0.5000 mL | INJECTION | INTRAMUSCULAR | Status: AC
  Administered 2011-09-11: 0.5 mL via INTRAMUSCULAR
  Filled 2011-09-10: qty 0.5

## 2011-09-10 MED ORDER — ENOXAPARIN SODIUM 40 MG/0.4ML ~~LOC~~ SOLN
40.0000 mg | SUBCUTANEOUS | Status: DC
  Administered 2011-09-11 – 2011-09-12 (×2): 40 mg via SUBCUTANEOUS
  Filled 2011-09-10 (×3): qty 0.4

## 2011-09-10 MED ORDER — TRAMADOL HCL 50 MG PO TABS
50.0000 mg | ORAL_TABLET | Freq: Once | ORAL | Status: AC
  Administered 2011-09-10: 50 mg via ORAL
  Filled 2011-09-10: qty 1

## 2011-09-10 NOTE — Progress Notes (Signed)
PCP: Geraldo Pitter, MD, MD  Brief HPI:  51 year old female with history of COPD, asthma presented to ED with complaints of worsening shortness of breath over couple of days prior to admission associated with productive whitish to yellowish cough and fever of 100.8Fat home. Patient had no complaints of chest pain, no night sweats, no nausea or vomiting and no diarrhea. No complaints of abdominal pain, no reports of blood in stool or urine,   Past medical history:  Past Medical History  Diagnosis Date  . Chronic headache   . GERD (gastroesophageal reflux disease)   . Chronic respiratory failure     O2 at home with exertion  . COPD (chronic obstructive pulmonary disease)     severe per chart  . Substance abuse   . Hiatal hernia    Consultants: None  Procedures: None  Subjective: Patient feels better compared to yesterday. Breathing better. Cough persists. Denies Chest Pain. Ambulated yesterday. Has oxygen at home but doesn't need it all the time.  Objective: Vital signs in last 24 hours: Temp:  [97.9 F (36.6 C)-98.3 F (36.8 C)] 98 F (36.7 C) (04/27 0981) Pulse Rate:  [73-98] 89  (04/27 0633) Resp:  [18-22] 18  (04/27 1914) BP: (108-120)/(70-80) 120/80 mmHg (04/27 0633) SpO2:  [87 %-98 %] 95 % (04/27 7829) Weight change:  Last BM Date: 09/07/11  Intake/Output from previous day: 04/26 0701 - 04/27 0700 In: 660 [P.O.:360; IV Piggyback:300] Out: -  Intake/Output this shift:    General appearance: alert, cooperative, appears stated age, no distress and moderately obese Head: Normocephalic, without obvious abnormality, atraumatic Back: symmetric, no curvature. ROM normal. No CVA tenderness. Resp: improved air entry bilaterally, still with occ wheezing, no crackles Cardio: regular rate and rhythm, S1, S2 normal, no murmur, click, rub or gallop GI: soft, non-tender; bowel sounds normal; no masses,  no organomegaly Extremities: extremities normal, atraumatic, no cyanosis  or edema Pulses: 2+ and symmetric Skin: Skin color, texture, turgor normal. No rashes or lesions Neurologic: Grossly normal  Lab Results:  Basename 09/10/11 0540 09/09/11 0455  WBC 16.5* 14.4*  HGB 11.2* 11.9*  HCT 34.5* 35.8*  PLT 262 233   BMET  Basename 09/10/11 0540 09/09/11 0455  NA 140 138  K 3.9 3.8  CL 103 102  CO2 31 25  GLUCOSE 165* 167*  BUN 13 8  CREATININE 0.75 0.72  CALCIUM 9.0 9.0  ALT -- 18    Studies/Results: Dg Chest 2 View  09/08/2011  *RADIOLOGY REPORT*  Clinical Data: Shortness of breath and wheezing.  Former smoker.  CHEST - 2 VIEW  Comparison: 01/13/2011  Findings: There is developing linear atelectasis or infiltration in the left lung base.  Normal heart size and pulmonary vascularity. Emphysematous changes and scattered fibrosis in the lungs.  No blunting of costophrenic angles.  No pneumothorax. Old right rib fractures.  IMPRESSION: Developing linear atelectasis or infiltration in the left lung base since previous study.  Original Report Authenticated By: Marlon Pel, M.D.   Ct Angio Chest W/cm &/or Wo Cm  09/09/2011  *RADIOLOGY REPORT*  Clinical Data: Fever, productive cough, shortness of breath and wheezing for 2 days.  Smoker.  CT ANGIOGRAPHY CHEST  Technique:  Multidetector CT imaging of the chest using the standard protocol during bolus administration of intravenous contrast. Multiplanar reconstructed images including MIPs were obtained and reviewed to evaluate the vascular anatomy.  Contrast: OMNIPAQUE IOHEXOL 300 MG/ML  SOLN  Comparison: 08/23/2006  Findings: Technically adequate study  with good opacification of the central and segmental pulmonary arteries.  No focal filling defect. No evidence of significant pulmonary embolus.  Focal consolidation or atelectasis in the right middle lung inferiorly and in both lung bases.  No pleural effusions.  Scattered emphysematous changes.  No pneumothorax.  Normal caliber thoracic aorta.  No  significant lymphadenopathy in the chest.  Scattered coronary artery calcifications.  Mild degenerative changes in the thoracic spine. The  IMPRESSION: No evidence of significant pulmonary embolus.  Focal consolidation or atelectasis in the right middle lung and both lung bases. Consider pneumonia.  Scattered emphysematous changes.  Original Report Authenticated By: Marlon Pel, M.D.    Medications:  Scheduled:   . sodium chloride   Intravenous STAT  . albuterol  2.5 mg Nebulization Q4H  . azithromycin  500 mg Intravenous Q24H  . cefTRIAXone (ROCEPHIN)  IV  1 g Intravenous Q24H  . enoxaparin  30 mg Subcutaneous Q24H  . methylPREDNISolone (SOLU-MEDROL) injection  60 mg Intravenous Q6H  . oxyCODONE  5 mg Oral Once  . tiotropium  18 mcg Inhalation Daily  . DISCONTD: ipratropium  0.5 mg Nebulization Q4H  . DISCONTD: methylPREDNISolone (SOLU-MEDROL) injection  60 mg Intravenous Q12H    Assessment/Plan:  Principal Problem:  *Acute asthma exacerbation Active Problems:  Community acquired pneumonia  Hypokalemia  Leukocytosis  Acute-on-chronic respiratory failure    Acute asthma exacerbation with COPD  Is showing improvement today. Continue with current treatments. Check RA sats. Her pulmonologist is Dr. Carolin Sicks.  Acute on chronic respiratory failure due to #1.  Improved. Continue ATC O2.   Community acquired pneumonia  Continue azithromycin and ceftriaxone day#2. Consider changing to oral agents from tomorrow.   Hypokalemia  Resolved. Will monitor closely   Leukocytosis  Likely secondary to pneumonia made worse by steroids. Pt afebrile and non-toxic.  DVT Prophylaxis With Enoxaparin  Full Code  Disposition: Possible discharge in 1-2 days.   LOS: 2 days   [facility]  [facility] Pager [phone] 09/10/2011, 9:02 AM

## 2011-09-11 DIAGNOSIS — J9600 Acute respiratory failure, unspecified whether with hypoxia or hypercapnia: Secondary | ICD-10-CM | POA: Diagnosis not present

## 2011-09-11 DIAGNOSIS — J159 Unspecified bacterial pneumonia: Secondary | ICD-10-CM | POA: Diagnosis not present

## 2011-09-11 DIAGNOSIS — J441 Chronic obstructive pulmonary disease with (acute) exacerbation: Secondary | ICD-10-CM | POA: Diagnosis not present

## 2011-09-11 LAB — BASIC METABOLIC PANEL
Calcium: 8.9 mg/dL (ref 8.4–10.5)
Creatinine, Ser: 0.67 mg/dL (ref 0.50–1.10)
GFR calc non Af Amer: >90 mL/min (ref >90)
Glucose, Bld: 149 mg/dL — ABNORMAL HIGH (ref 70–99)
Sodium: 140 mEq/L (ref 135–145)

## 2011-09-11 LAB — CBC
Hemoglobin: 11.3 g/dL — ABNORMAL LOW (ref 12.0–15.0)
MCH: 29.4 pg (ref 26.0–34.0)
MCHC: 32.8 g/dL (ref 30.0–36.0)
MCV: 89.4 fL (ref 78.0–100.0)

## 2011-09-11 MED ORDER — TRAMADOL HCL 50 MG PO TABS
50.0000 mg | ORAL_TABLET | Freq: Once | ORAL | Status: AC
  Administered 2011-09-11: 50 mg via ORAL
  Filled 2011-09-11: qty 1

## 2011-09-11 MED ORDER — MOXIFLOXACIN HCL 400 MG PO TABS
400.0000 mg | ORAL_TABLET | Freq: Every day | ORAL | Status: DC
  Administered 2011-09-11: 400 mg via ORAL
  Filled 2011-09-11 (×2): qty 1

## 2011-09-11 MED ORDER — AZITHROMYCIN 500 MG PO TABS
500.0000 mg | ORAL_TABLET | Freq: Every day | ORAL | Status: DC
  Filled 2011-09-11: qty 1

## 2011-09-11 NOTE — Progress Notes (Signed)
PCP: Geraldo Pitter, MD, MD  Brief HPI:  51 year old female with history of COPD, asthma presented to ED with complaints of worsening shortness of breath over couple of days prior to admission associated with productive whitish to yellowish cough and fever of 100.8Fat home. Patient had no complaints of chest pain, no night sweats, no nausea or vomiting and no diarrhea. No complaints of abdominal pain, no reports of blood in stool or urine,   Past medical history:  Past Medical History  Diagnosis Date  . Chronic headache   . GERD (gastroesophageal reflux disease)   . Chronic respiratory failure     O2 at home with exertion  . COPD (chronic obstructive pulmonary disease)     severe per chart  . Substance abuse   . Hiatal hernia    Consultants: None  Procedures: None  Subjective: Patient feels better compared to yesterday. Breathing better but not at baseline. Apparently O2 sats dropped to 89 when she ambulated. Cough persists. Denies Chest Pain. Has oxygen at home but doesn't need it all the time.  Objective: Vital signs in last 24 hours: Temp:  [98 F (36.7 C)-98.5 F (36.9 C)] 98.2 F (36.8 C) (04/28 0615) Pulse Rate:  [82-87] 82  (04/28 0615) Resp:  [19-20] 19  (04/28 0615) BP: (119-131)/(77-83) 128/83 mmHg (04/28 0615) SpO2:  [94 %-98 %] 97 % (04/28 0825) Weight change:  Last BM Date: 09/07/11  Intake/Output from previous day: 04/27 0701 - 04/28 0700 In: 1020 [P.O.:720; IV Piggyback:300] Out: -  Intake/Output this shift: Total I/O In: 240 [P.O.:240] Out: -   General appearance: alert, cooperative, appears stated age, no distress and moderately obese Head: Normocephalic, without obvious abnormality, atraumatic Back: symmetric, no curvature. ROM normal. No CVA tenderness. Resp: improved air entry bilaterally, still with occ wheezing, no crackles Cardio: regular rate and rhythm, S1, S2 normal, no murmur, click, rub or gallop GI: soft, non-tender; bowel sounds  normal; no masses,  no organomegaly Extremities: extremities normal, atraumatic, no cyanosis or edema Pulses: 2+ and symmetric Skin: Skin color, texture, turgor normal. No rashes or lesions Neurologic: Grossly normal  Lab Results:  Basename 09/11/11 0421 09/10/11 0540  WBC 14.0* 16.5*  HGB 11.3* 11.2*  HCT 34.4* 34.5*  PLT 271 262   BMET  Basename 09/11/11 0421 09/10/11 0540 09/09/11 0455  NA 140 140 --  K 4.0 3.9 --  CL 104 103 --  CO2 27 31 --  GLUCOSE 149* 165* --  BUN 15 13 --  CREATININE 0.67 0.75 --  CALCIUM 8.9 9.0 --  ALT -- -- 18    Studies/Results: No results found.  Medications:  Scheduled:    . albuterol  2.5 mg Nebulization Q4H  . azithromycin  500 mg Oral QHS  . cefTRIAXone (ROCEPHIN)  IV  1 g Intravenous Q24H  . enoxaparin  40 mg Subcutaneous Q24H  . methylPREDNISolone (SOLU-MEDROL) injection  60 mg Intravenous Q6H  . pneumococcal 23 valent vaccine  0.5 mL Intramuscular Tomorrow-1000  . tiotropium  18 mcg Inhalation Daily  . traMADol  50 mg Oral Once  . DISCONTD: azithromycin  500 mg Intravenous Q24H  . DISCONTD: enoxaparin  30 mg Subcutaneous Q24H    Assessment/Plan:  Principal Problem:  *Acute asthma exacerbation Active Problems:  Community acquired pneumonia  Hypokalemia  Leukocytosis  Acute-on-chronic respiratory failure    Acute asthma exacerbation with COPD  Is showing slow improvement. Change to oral abx. Continue intensive neb rx and IV steroids. Her pulmonologist  is Dr. Carolin Sicks.  Acute on chronic respiratory failure due to #1.  Improved. Continue ATC O2.   Community acquired pneumonia  Change to Avelox. Day 3 of abx.   Hypokalemia  Resolved.   Leukocytosis  Likely secondary to pneumonia made worse by steroids. Pt afebrile and non-toxic.  DVT Prophylaxis With Enoxaparin  Full Code  Disposition: Possible discharge in AM.   LOS: 3 days   [facility]  [facility] Pager [phone] 09/11/2011, 11:44  AM

## 2011-09-11 NOTE — Progress Notes (Signed)
Patient meets P and T Committee Criteria for auto conversion of IV antibiotic to PO: 1. Tolerating diet and other po meds.  2. No malabsorption syndrome. 3. Afebrile 4. MD notes indicate improvement.  Will change antibiotic to po per P and T Committee policy.  

## 2011-09-12 DIAGNOSIS — J159 Unspecified bacterial pneumonia: Secondary | ICD-10-CM | POA: Diagnosis not present

## 2011-09-12 DIAGNOSIS — J441 Chronic obstructive pulmonary disease with (acute) exacerbation: Secondary | ICD-10-CM | POA: Diagnosis not present

## 2011-09-12 MED ORDER — ALBUTEROL SULFATE (2.5 MG/3ML) 0.083% IN NEBU: 2.5000 mg | INHALATION_SOLUTION | Freq: Four times a day (QID) | RESPIRATORY_TRACT | Status: DC

## 2011-09-12 MED ORDER — OXYCODONE HCL 5 MG PO TABS: 5.0000 mg | ORAL_TABLET | ORAL | Status: AC | PRN

## 2011-09-12 MED ORDER — MOXIFLOXACIN HCL 400 MG PO TABS: 400.0000 mg | ORAL_TABLET | Freq: Every day | ORAL | Status: AC

## 2011-09-12 MED ORDER — ALBUTEROL SULFATE HFA 108 (90 BASE) MCG/ACT IN AERS: 2.0000 | INHALATION_SPRAY | Freq: Four times a day (QID) | RESPIRATORY_TRACT | Status: DC | PRN

## 2011-09-12 MED ORDER — PREDNISONE 20 MG PO TABS: ORAL_TABLET | ORAL | Status: DC

## 2011-09-12 NOTE — Discharge Instructions (Signed)

## 2011-09-12 NOTE — Discharge Summary (Signed)
Physician Discharge Summary  Patient ID: [name] MRN: [id number] DOB/AGE: 53-09-1960 53 y.o.  Admit date: 09/08/2011 Discharge date: 09/12/2011  PCP: Geraldo Pitter, MD, MD  Discharge Diagnoses:  Principal Problem:  *Acute asthma exacerbation Active Problems:  Community acquired pneumonia  Leukocytosis  Acute-on-chronic respiratory failure   Discharged Condition: fair  Initial History: 51 year old female with history of COPD, asthma presented to ED with complaints of worsening shortness of breath over couple of days prior to admission associated with productive whitish to yellowish cough and fever of 100.8Fat home. Patient had no complaints of chest pain, no night sweats, no nausea or vomiting and no diarrhea. No complaints of abdominal pain, no reports of blood in stool or urine.   Hospital Course:   Acute asthma exacerbation with COPD  More of COPD than Asthma but patient does have reactive airways. Patient has improved with intensive treatment. She still has wheezing but can be managed further at home. She has nebulizer solutions. She will need to follow up with her PCP and with her pulmonologist, Dr. Carolin Sicks.   Acute on chronic respiratory failure due to #1.  Improved as mentioned above. Continue with home oxygen.  Community acquired pneumonia  She has improved with antibiotics. Will be discharged on Avelox. May obtain follow up CXR in 4-6 weeks.  Leukocytosis  Likely secondary to pneumonia made worse by steroids. Pt afebrile and non-toxic. WBC was trending down.  She was also hypokalemic which was corrected.  Overall patient has improved significantly. She will continue rest of the treatment at home. Will be discharged on tapering course of steroids.   IMAGING STUDIES Dg Chest 2 View  09/08/2011  *RADIOLOGY REPORT*  Clinical Data: Shortness of breath and wheezing.  Former smoker.  CHEST - 2 VIEW  Comparison: 01/13/2011  Findings: There is developing linear  atelectasis or infiltration in the left lung base.  Normal heart size and pulmonary vascularity. Emphysematous changes and scattered fibrosis in the lungs.  No blunting of costophrenic angles.  No pneumothorax. Old right rib fractures.  IMPRESSION: Developing linear atelectasis or infiltration in the left lung base since previous study.  Original Report Authenticated By: Marlon Pel, M.D.   Ct Angio Chest W/cm &/or Wo Cm  09/09/2011  *RADIOLOGY REPORT*  Clinical Data: Fever, productive cough, shortness of breath and wheezing for 2 days.  Smoker.  CT ANGIOGRAPHY CHEST  Technique:  Multidetector CT imaging of the chest using the standard protocol during bolus administration of intravenous contrast. Multiplanar reconstructed images including MIPs were obtained and reviewed to evaluate the vascular anatomy.  Contrast: OMNIPAQUE IOHEXOL 300 MG/ML  SOLN  Comparison: 08/23/2006  Findings: Technically adequate study with good opacification of the central and segmental pulmonary arteries.  No focal filling defect. No evidence of significant pulmonary embolus.  Focal consolidation or atelectasis in the right middle lung inferiorly and in both lung bases.  No pleural effusions.  Scattered emphysematous changes.  No pneumothorax.  Normal caliber thoracic aorta.  No significant lymphadenopathy in the chest.  Scattered coronary artery calcifications.  Mild degenerative changes in the thoracic spine. The  IMPRESSION: No evidence of significant pulmonary embolus.  Focal consolidation or atelectasis in the right middle lung and both lung bases. Consider pneumonia.  Scattered emphysematous changes.  Original Report Authenticated By: Marlon Pel, M.D.    Discharge Exam: Blood pressure 125/77, pulse 69, temperature 97.4 F (36.3 C), temperature source Oral, resp. rate 20, height 5\' 3"  (1.6 m), weight 91.354  kg (201 lb 6.4 oz), SpO2 96.00%. General appearance: alert, cooperative, appears stated age and no  distress Head: Normocephalic, without obvious abnormality, atraumatic Resp: few end exp wheezes bilaterally, no crackles Cardio: regular rate and rhythm, S1, S2 normal, no murmur, click, rub or gallop Extremities: extremities normal, atraumatic, no cyanosis or edema Pulses: 2+ and symmetric Neurologic: Alert and oriented X 3, normal strength and tone. Normal symmetric reflexes. Normal coordination and gait  Disposition: 01-Home or Self Care  Discharge Orders    Future Appointments: Provider: Department: Dept Phone: Center:   11/14/2011 10:30 AM Barbaraann Share, MD [facility] [phone] None     Future Orders Please Complete By Expires   Diet - low sodium heart healthy      Increase activity slowly      Call MD for:      Comments:   Worsening shortness of breath     Current Discharge Medication List    START taking these medications   Details  albuterol (PROVENTIL HFA;VENTOLIN HFA) 108 (90 BASE) MCG/ACT inhaler Inhale 2 puffs into the lungs every 6 (six) hours as needed for wheezing. Qty: 1 Inhaler, Refills: 2    moxifloxacin (AVELOX) 400 MG tablet Take 1 tablet (400 mg total) by mouth daily at 6 PM. For 5 days Qty: 5 tablet, Refills: 0    oxyCODONE (ROXICODONE) 5 MG immediate release tablet Take 1 tablet (5 mg total) by mouth every 4 (four) hours as needed for pain. Qty: 10 tablet, Refills: 0    predniSONE (DELTASONE) 20 MG tablet Take 3 tabs daily for 4 days, then take 2 tabs daily for 4 days, then take 1 tabs daily for 5 days, then STOP Qty: 25 tablet, Refills: 0      CONTINUE these medications which have CHANGED   Details  albuterol (PROVENTIL) (2.5 MG/3ML) 0.083% nebulizer solution Take 3 mLs (2.5 mg total) by nebulization every 6 (six) hours. Qty: 75 mL, Refills: 2      CONTINUE these medications which have NOT CHANGED   Details  budesonide-formoterol (SYMBICORT) 160-4.5 MCG/ACT inhaler Inhale 2 puffs into the lungs 2 (two) times daily.        Chlorphen-Phenyleph-ASA (ALKA-SELTZER PLUS COLD PO) Take 1 tablet by mouth every 6 (six) hours as needed. For fever.    tiotropium (SPIRIVA) 18 MCG inhalation capsule Place 18 mcg into inhaler and inhale daily.         Follow-up Information    Follow up with Geraldo Pitter, MD. Schedule an appointment as soon as possible for a visit in 1 week. (post hospitalization follow up)    Contact information:   [address] [place] [address] [phone]       Follow up with Barbaraann Share, MD. Schedule an appointment as soon as possible for a visit in 3 weeks.   Contact information:   [address] [employer] [place] [address] [phone]          Total Discharge Time: 35 mins  [facility]  [facility] Pager [phone]  09/12/2011, 12:12 PM

## 2011-09-14 DIAGNOSIS — M1710 Unilateral primary osteoarthritis, unspecified knee: Secondary | ICD-10-CM | POA: Diagnosis not present

## 2011-09-14 DIAGNOSIS — M25569 Pain in unspecified knee: Secondary | ICD-10-CM | POA: Diagnosis not present

## 2011-09-15 LAB — CULTURE, BLOOD (ROUTINE X 2): Culture: NO GROWTH

## 2011-10-05 ENCOUNTER — Ambulatory Visit: Payer: Medicare Other | Attending: Family Medicine

## 2011-10-05 DIAGNOSIS — M25569 Pain in unspecified knee: Secondary | ICD-10-CM | POA: Diagnosis not present

## 2011-10-05 DIAGNOSIS — R262 Difficulty in walking, not elsewhere classified: Secondary | ICD-10-CM | POA: Insufficient documentation

## 2011-10-05 DIAGNOSIS — IMO0001 Reserved for inherently not codable concepts without codable children: Secondary | ICD-10-CM | POA: Insufficient documentation

## 2011-10-14 ENCOUNTER — Ambulatory Visit: Payer: Medicare Other

## 2011-10-14 DIAGNOSIS — M25569 Pain in unspecified knee: Secondary | ICD-10-CM | POA: Diagnosis not present

## 2011-10-14 DIAGNOSIS — R262 Difficulty in walking, not elsewhere classified: Secondary | ICD-10-CM | POA: Diagnosis not present

## 2011-10-14 DIAGNOSIS — IMO0001 Reserved for inherently not codable concepts without codable children: Secondary | ICD-10-CM | POA: Diagnosis not present

## 2011-10-18 ENCOUNTER — Ambulatory Visit: Payer: Medicare Other | Attending: Family Medicine

## 2011-10-18 DIAGNOSIS — IMO0001 Reserved for inherently not codable concepts without codable children: Secondary | ICD-10-CM | POA: Insufficient documentation

## 2011-10-18 DIAGNOSIS — M25569 Pain in unspecified knee: Secondary | ICD-10-CM | POA: Insufficient documentation

## 2011-10-18 DIAGNOSIS — R262 Difficulty in walking, not elsewhere classified: Secondary | ICD-10-CM | POA: Insufficient documentation

## 2011-10-20 ENCOUNTER — Ambulatory Visit: Payer: Medicare Other

## 2011-10-31 ENCOUNTER — Ambulatory Visit: Payer: Medicare Other

## 2011-11-02 ENCOUNTER — Ambulatory Visit: Payer: Medicare Other

## 2011-11-08 ENCOUNTER — Ambulatory Visit: Payer: Medicare Other

## 2011-11-10 ENCOUNTER — Ambulatory Visit: Payer: Medicare Other

## 2011-11-14 ENCOUNTER — Encounter: Payer: Self-pay | Admitting: Pulmonary Disease

## 2011-11-14 ENCOUNTER — Ambulatory Visit (INDEPENDENT_AMBULATORY_CARE_PROVIDER_SITE_OTHER): Payer: Medicare Other | Admitting: Pulmonary Disease

## 2011-11-14 VITALS — BP 112/70 | HR 78 | Temp 98.5°F | Ht 62.0 in | Wt 204.6 lb

## 2011-11-14 DIAGNOSIS — G4734 Idiopathic sleep related nonobstructive alveolar hypoventilation: Secondary | ICD-10-CM | POA: Insufficient documentation

## 2011-11-14 DIAGNOSIS — J9610 Chronic respiratory failure, unspecified whether with hypoxia or hypercapnia: Secondary | ICD-10-CM | POA: Diagnosis not present

## 2011-11-14 DIAGNOSIS — J449 Chronic obstructive pulmonary disease, unspecified: Secondary | ICD-10-CM | POA: Diagnosis not present

## 2011-11-14 HISTORY — DX: Idiopathic sleep related nonobstructive alveolar hypoventilation: G47.34

## 2011-11-14 NOTE — Progress Notes (Signed)
  Subjective:    Patient ID: [name], female    DOB: [birth year], 51 y.o.   MRN: [id number]  HPI Patient comes in today for followup of her known severe COPD.  She was in the hospital in April with a questionable right middle lobe pneumonia, and also a COPD exacerbation.  She has improved since discharge, but has not yet returned to her usual baseline.  I have explained it takes time.  She denies any chest congestion, purulent mucus, or wheezing.  She has been compliant with her medications and also her oxygen.   Review of Systems  Constitutional: Negative for fever and unexpected weight change.  HENT: Negative for ear pain, nosebleeds, congestion, sore throat, rhinorrhea, sneezing, trouble swallowing, dental problem, postnasal drip and sinus pressure.   Eyes: Negative for redness and itching.  Respiratory: Positive for cough and shortness of breath. Negative for chest tightness and wheezing.   Cardiovascular: Negative for palpitations and leg swelling.  Gastrointestinal: Negative for nausea and vomiting.  Genitourinary: Negative for dysuria.  Musculoskeletal: Negative for joint swelling.  Skin: Negative for rash.  Neurological: Positive for headaches.  Hematological: Does not bruise/bleed easily.  Psychiatric/Behavioral: Negative for dysphoric mood. The patient is nervous/anxious.   All other systems reviewed and are negative.       Objective:   Physical Exam Obese female in no acute distress Nose without purulence or discharge noted Chest with decreased breath sounds throughout, no wheezes or rhonchi Heart exam with regular rate and rhythm Lower extremities with minimal edema, no cyanosis Alert and oriented, moves all 4 extremities.       Assessment & Plan:

## 2011-11-14 NOTE — Patient Instructions (Addendum)
No change in medications Work on weight loss and some type of exercise program. Stay out of heat and humidity this summer followup with me in 4mos, but call if not doing well.

## 2011-11-14 NOTE — Assessment & Plan Note (Signed)
The patient is slowly improving after a recent admission for questionable right middle lobe pneumonia and COPD exacerbation.  She does not have any acute symptoms at this time, and is continuing on her bronchodilator regimen.  I have recommended no change in her medications, but have stressed to her the importance of weight loss and some type of conditioning program.  She will followup with me in 4 months if stable.

## 2011-11-21 ENCOUNTER — Ambulatory Visit: Payer: Medicare Other | Attending: Family Medicine

## 2011-11-21 DIAGNOSIS — R262 Difficulty in walking, not elsewhere classified: Secondary | ICD-10-CM | POA: Diagnosis not present

## 2011-11-21 DIAGNOSIS — M25569 Pain in unspecified knee: Secondary | ICD-10-CM | POA: Insufficient documentation

## 2011-11-21 DIAGNOSIS — IMO0001 Reserved for inherently not codable concepts without codable children: Secondary | ICD-10-CM | POA: Diagnosis not present

## 2011-11-29 ENCOUNTER — Ambulatory Visit: Payer: Medicare Other

## 2011-12-01 ENCOUNTER — Ambulatory Visit: Payer: Medicare Other

## 2011-12-23 DIAGNOSIS — Z1231 Encounter for screening mammogram for malignant neoplasm of breast: Secondary | ICD-10-CM | POA: Diagnosis not present

## 2011-12-23 DIAGNOSIS — M899 Disorder of bone, unspecified: Secondary | ICD-10-CM | POA: Diagnosis not present

## 2012-01-20 DIAGNOSIS — J449 Chronic obstructive pulmonary disease, unspecified: Secondary | ICD-10-CM | POA: Diagnosis not present

## 2012-01-20 DIAGNOSIS — M810 Age-related osteoporosis without current pathological fracture: Secondary | ICD-10-CM | POA: Diagnosis not present

## 2012-02-07 ENCOUNTER — Emergency Department (HOSPITAL_COMMUNITY): Payer: Medicare Other

## 2012-02-07 ENCOUNTER — Inpatient Hospital Stay (HOSPITAL_COMMUNITY)
Admission: EM | Admit: 2012-02-07 | Discharge: 2012-02-09 | DRG: 193 | Disposition: A | Discharge status: Home / Self Care | Payer: Medicare Other | Attending: Internal Medicine | Admitting: Internal Medicine

## 2012-02-07 ENCOUNTER — Inpatient Hospital Stay (HOSPITAL_COMMUNITY): Payer: Medicare Other

## 2012-02-07 ENCOUNTER — Encounter (HOSPITAL_COMMUNITY): Payer: Self-pay | Admitting: *Deleted

## 2012-02-07 DIAGNOSIS — T380X5A Adverse effect of glucocorticoids and synthetic analogues, initial encounter: Secondary | ICD-10-CM | POA: Diagnosis present

## 2012-02-07 DIAGNOSIS — J449 Chronic obstructive pulmonary disease, unspecified: Secondary | ICD-10-CM | POA: Diagnosis present

## 2012-02-07 DIAGNOSIS — R0602 Shortness of breath: Secondary | ICD-10-CM | POA: Diagnosis not present

## 2012-02-07 DIAGNOSIS — R51 Headache: Secondary | ICD-10-CM | POA: Diagnosis present

## 2012-02-07 DIAGNOSIS — D72829 Elevated white blood cell count, unspecified: Secondary | ICD-10-CM | POA: Diagnosis not present

## 2012-02-07 DIAGNOSIS — B9789 Other viral agents as the cause of diseases classified elsewhere: Secondary | ICD-10-CM

## 2012-02-07 DIAGNOSIS — Z9981 Dependence on supplemental oxygen: Secondary | ICD-10-CM

## 2012-02-07 DIAGNOSIS — K219 Gastro-esophageal reflux disease without esophagitis: Secondary | ICD-10-CM | POA: Diagnosis not present

## 2012-02-07 DIAGNOSIS — R519 Headache, unspecified: Secondary | ICD-10-CM | POA: Diagnosis present

## 2012-02-07 DIAGNOSIS — K449 Diaphragmatic hernia without obstruction or gangrene: Secondary | ICD-10-CM | POA: Diagnosis present

## 2012-02-07 DIAGNOSIS — J9819 Other pulmonary collapse: Secondary | ICD-10-CM | POA: Diagnosis not present

## 2012-02-07 DIAGNOSIS — J441 Chronic obstructive pulmonary disease with (acute) exacerbation: Secondary | ICD-10-CM

## 2012-02-07 DIAGNOSIS — J189 Pneumonia, unspecified organism: Principal | ICD-10-CM | POA: Diagnosis present

## 2012-02-07 DIAGNOSIS — Z87891 Personal history of nicotine dependence: Secondary | ICD-10-CM

## 2012-02-07 DIAGNOSIS — J9620 Acute and chronic respiratory failure, unspecified whether with hypoxia or hypercapnia: Secondary | ICD-10-CM | POA: Diagnosis not present

## 2012-02-07 DIAGNOSIS — G8929 Other chronic pain: Secondary | ICD-10-CM | POA: Diagnosis present

## 2012-02-07 DIAGNOSIS — F1910 Other psychoactive substance abuse, uncomplicated: Secondary | ICD-10-CM | POA: Insufficient documentation

## 2012-02-07 LAB — COMPREHENSIVE METABOLIC PANEL
ALT: 12 U/L (ref 0–35)
AST: 17 U/L (ref 0–37)
CO2: 21 mEq/L (ref 19–32)
Calcium: 9.2 mg/dL (ref 8.4–10.5)
Sodium: 135 mEq/L (ref 135–145)
Total Protein: 7.8 g/dL (ref 6.0–8.3)

## 2012-02-07 LAB — BLOOD GAS, ARTERIAL
Bicarbonate: 25 mEq/L — ABNORMAL HIGH (ref 20.0–24.0)
pCO2 arterial: 35.1 mmHg (ref 35.0–45.0)
pH, Arterial: 7.466 — ABNORMAL HIGH (ref 7.350–7.450)
pO2, Arterial: 123 mmHg — ABNORMAL HIGH (ref 80.0–100.0)

## 2012-02-07 LAB — CBC WITH DIFFERENTIAL/PLATELET
Basophils Absolute: 0 10*3/uL (ref 0.0–0.1)
Eosinophils Absolute: 0.1 10*3/uL (ref 0.0–0.7)
Eosinophils Relative: 1 % (ref 0–5)
Lymphocytes Relative: 16 % (ref 12–46)
MCV: 86 fL (ref 78.0–100.0)
Neutrophils Relative %: 79 % — ABNORMAL HIGH (ref 43–77)
Platelets: 259 10*3/uL (ref 150–400)
RDW: 13.8 % (ref 11.5–15.5)
WBC: 10.8 10*3/uL — ABNORMAL HIGH (ref 4.0–10.5)

## 2012-02-07 LAB — RAPID URINE DRUG SCREEN, HOSP PERFORMED
Benzodiazepines: NOT DETECTED
Cocaine: NOT DETECTED

## 2012-02-07 LAB — RAPID STREP SCREEN (MED CTR MEBANE ONLY): Streptococcus, Group A Screen (Direct): NEGATIVE

## 2012-02-07 MED ORDER — SODIUM CHLORIDE 0.9 % IV BOLUS (SEPSIS)
500.0000 mL | Freq: Once | INTRAVENOUS | Status: AC
  Administered 2012-02-07: 500 mL via INTRAVENOUS

## 2012-02-07 MED ORDER — BUDESONIDE-FORMOTEROL FUMARATE 160-4.5 MCG/ACT IN AERO
2.0000 | INHALATION_SPRAY | Freq: Two times a day (BID) | RESPIRATORY_TRACT | Status: DC
  Administered 2012-02-07 – 2012-02-09 (×4): 2 via RESPIRATORY_TRACT
  Filled 2012-02-07: qty 6

## 2012-02-07 MED ORDER — METHYLPREDNISOLONE SODIUM SUCC 125 MG IJ SOLR
80.0000 mg | Freq: Four times a day (QID) | INTRAMUSCULAR | Status: DC
  Administered 2012-02-07 – 2012-02-08 (×4): 80 mg via INTRAVENOUS
  Filled 2012-02-07 (×7): qty 1.28

## 2012-02-07 MED ORDER — ALBUTEROL SULFATE (5 MG/ML) 0.5% IN NEBU
2.5000 mg | INHALATION_SOLUTION | RESPIRATORY_TRACT | Status: DC
  Administered 2012-02-07: 2.5 mg via RESPIRATORY_TRACT
  Filled 2012-02-07: qty 0.5

## 2012-02-07 MED ORDER — ALBUTEROL SULFATE (5 MG/ML) 0.5% IN NEBU
INHALATION_SOLUTION | RESPIRATORY_TRACT | Status: AC
  Administered 2012-02-07: 10 mg/h via RESPIRATORY_TRACT
  Filled 2012-02-07: qty 1

## 2012-02-07 MED ORDER — TRAMADOL HCL 50 MG PO TABS
50.0000 mg | ORAL_TABLET | Freq: Two times a day (BID) | ORAL | Status: DC
  Administered 2012-02-07 – 2012-02-09 (×5): 50 mg via ORAL
  Filled 2012-02-07 (×7): qty 1

## 2012-02-07 MED ORDER — ALBUTEROL (5 MG/ML) CONTINUOUS INHALATION SOLN
10.0000 mg/h | INHALATION_SOLUTION | Freq: Once | RESPIRATORY_TRACT | Status: AC
  Administered 2012-02-07: 10 mg/h via RESPIRATORY_TRACT

## 2012-02-07 MED ORDER — ALBUTEROL SULFATE (5 MG/ML) 0.5% IN NEBU
5.0000 mg | INHALATION_SOLUTION | RESPIRATORY_TRACT | Status: DC
  Administered 2012-02-07: 5 mg via RESPIRATORY_TRACT
  Administered 2012-02-08: 2.5 mg via RESPIRATORY_TRACT
  Administered 2012-02-08 (×2): 5 mg via RESPIRATORY_TRACT
  Filled 2012-02-07: qty 0.5
  Filled 2012-02-07: qty 1
  Filled 2012-02-07 (×2): qty 0.5
  Filled 2012-02-07: qty 1

## 2012-02-07 MED ORDER — IPRATROPIUM BROMIDE 0.02 % IN SOLN
0.5000 mg | Freq: Once | RESPIRATORY_TRACT | Status: AC
  Administered 2012-02-07: 0.5 mg via RESPIRATORY_TRACT
  Filled 2012-02-07: qty 2.5

## 2012-02-07 MED ORDER — ALBUTEROL SULFATE HFA 108 (90 BASE) MCG/ACT IN AERS: 2.0000 | INHALATION_SPRAY | Freq: Four times a day (QID) | RESPIRATORY_TRACT | Status: DC | PRN

## 2012-02-07 MED ORDER — GUAIFENESIN-DM 100-10 MG/5ML PO SYRP: 5.0000 mL | ORAL_SOLUTION | ORAL | Status: DC | PRN

## 2012-02-07 MED ORDER — TIOTROPIUM BROMIDE MONOHYDRATE 18 MCG IN CAPS
18.0000 ug | ORAL_CAPSULE | Freq: Every day | RESPIRATORY_TRACT | Status: DC
  Administered 2012-02-08 – 2012-02-09 (×2): 18 ug via RESPIRATORY_TRACT
  Filled 2012-02-07: qty 5

## 2012-02-07 MED ORDER — LEVOFLOXACIN IN D5W 750 MG/150ML IV SOLN
750.0000 mg | INTRAVENOUS | Status: DC
  Administered 2012-02-07 – 2012-02-08 (×2): 750 mg via INTRAVENOUS
  Filled 2012-02-07 (×3): qty 150

## 2012-02-07 MED ORDER — INFLUENZA VIRUS VACC SPLIT PF IM SUSP: 0.5000 mL | INTRAMUSCULAR | Status: DC

## 2012-02-07 MED ORDER — METHYLPREDNISOLONE SODIUM SUCC 125 MG IJ SOLR
INTRAMUSCULAR | Status: AC
  Administered 2012-02-07: 11:00:00
  Filled 2012-02-07: qty 2

## 2012-02-07 MED ORDER — HEPARIN SODIUM (PORCINE) 5000 UNIT/ML IJ SOLN
5000.0000 [IU] | Freq: Three times a day (TID) | INTRAMUSCULAR | Status: DC
  Administered 2012-02-07 – 2012-02-09 (×5): 5000 [IU] via SUBCUTANEOUS
  Filled 2012-02-07 (×8): qty 1

## 2012-02-07 NOTE — ED Notes (Signed)
Pt states has a hx of COPD, started having shortness of breath last night, pt having difficulty breathing, 82% on RA, pt gave herself 1 tx this morning at home around 0730. Labored breathing noted, wheezing.

## 2012-02-07 NOTE — ED Provider Notes (Signed)
History     CSN: [id number]  Arrival date & time 02/07/12  1055   First MD Initiated Contact with Patient 02/07/12 1059      Chief Complaint  Patient presents with  . Shortness of Breath    (Consider location/radiation/quality/duration/timing/severity/associated sxs/prior treatment) HPI Pt with worsening SOB and wheezing x 1 day. She has history of COPD and is on 3 L home O2. No fever chills. +cough productive of sputum and sore throat. Used neb before coming in. No lower ext swelling or pain Past Medical History  Diagnosis Date  . Chronic headache   . GERD (gastroesophageal reflux disease)   . Chronic respiratory failure     O2 at home with exertion  . COPD (chronic obstructive pulmonary disease)     severe per chart  . Substance abuse   . Hiatal hernia     Past Surgical History  Procedure Date  . Hernia repair 02/2009    Family History  Problem Relation Age of Onset  . Heart disease Father     History  Substance Use Topics  . Smoking status: Former Smoker -- 1.0 packs/day for 20 years    Types: Cigarettes    Quit date: 01/18/2009  . Smokeless tobacco: Never Used  . Alcohol Use: Yes    OB History    Grav Para Term Preterm Abortions TAB SAB Ect Mult Living                  Review of Systems  Constitutional: Negative for fever and chills.  HENT: Positive for sore throat. Negative for facial swelling.   Respiratory: Positive for cough, shortness of breath and wheezing.   Cardiovascular: Negative for chest pain, palpitations and leg swelling.  Gastrointestinal: Negative for nausea, vomiting and abdominal pain.  Skin: Negative for pallor and rash.  Neurological: Negative for weakness, numbness and headaches.    Allergies  Ibuprofen and Tylenol  Home Medications   Current Outpatient Rx  Name Route Sig Dispense Refill  . ALBUTEROL SULFATE HFA 108 (90 BASE) MCG/ACT IN AERS Inhalation Inhale 2 puffs into the lungs every 6 (six) hours as needed. Wheezing     . ALBUTEROL SULFATE (2.5 MG/3ML) 0.083% IN NEBU Nebulization Take 2.5 mg by nebulization every 6 (six) hours. Resp. Distress    . BUDESONIDE-FORMOTEROL FUMARATE 160-4.5 MCG/ACT IN AERO Inhalation Inhale 2 puffs into the lungs 2 (two) times daily.      Marland Kitchen TIOTROPIUM BROMIDE MONOHYDRATE 18 MCG IN CAPS Inhalation Place 18 mcg into inhaler and inhale daily.        BP 106/71  Pulse 107  Temp 99 F (37.2 C) (Oral)  Resp 22  SpO2 96%  Physical Exam  Nursing note and vitals reviewed. Constitutional: She is oriented to person, place, and time. She appears well-developed and well-nourished. She appears distressed.  HENT:  Head: Normocephalic and atraumatic.  Mouth/Throat: Oropharynx is clear and moist.       Dry MM with erythematous post pharynx   Eyes: EOM are normal. Pupils are equal, round, and reactive to light.  Neck: Normal range of motion. Neck supple.  Cardiovascular: Normal rate and regular rhythm.   Pulmonary/Chest: She is in respiratory distress. She has wheezes. She has no rales.       Pt speaking in full sentences but with labored breathing, prolonged expiratory phase. Diffuse wheezing  Abdominal: Soft. Bowel sounds are normal. There is no tenderness. There is no rebound and no guarding.  Musculoskeletal: Normal range of motion.  She exhibits no edema and no tenderness.  Neurological: She is alert and oriented to person, place, and time.       Moves all ext without deficit  Skin: Skin is warm and dry. No rash noted. No erythema.  Psychiatric:       anxious    ED Course  Procedures (including critical care time)  Labs Reviewed  CBC WITH DIFFERENTIAL - Abnormal; Notable for the following:    WBC 10.8 (*)     Neutrophils Relative 79 (*)     Neutro Abs 8.5 (*)     All other components within normal limits  BLOOD GAS, ARTERIAL - Abnormal; Notable for the following:    pH, Arterial 7.466 (*)     pO2, Arterial 123.0 (*)     Bicarbonate 25.0 (*)     All other components  within normal limits  COMPREHENSIVE METABOLIC PANEL  TROPONIN I  URINE RAPID DRUG SCREEN (HOSP PERFORMED)   Dg Chest Port 1 View  02/07/2012  *RADIOLOGY REPORT*  Clinical Data: Shortness of breath  PORTABLE CHEST - 1 VIEW  Comparison: Portable exam 1136 hours compared to 09/08/2011  Findings: Normal heart size, mediastinal contours, and pulmonary vascularity. Mild chronic left basilar atelectasis. Lungs otherwise clear. No pleural effusion or pneumothorax. No acute osseous findings.  IMPRESSION: Mild chronic left basilar atelectasis.   Original Report Authenticated By: Lollie Marrow, M.D.      1. COPD exacerbation       Date: 02/07/2012  Rate: 110  Rhythm: sinus tachycardia  QRS Axis: normal  Intervals: normal  ST/T Wave abnormalities: nonspecific T wave changes  Conduction Disutrbances:none  Narrative Interpretation:   Old EKG Reviewed: unchanged   MDM  Pt feeling better after nebs and bipap. Breathing more comfortably.   Pt tolerated being off bipap well. Discussed with [facility] who will admit for COPD exacerbation.           Loren Racer, MD 02/07/12 1415

## 2012-02-07 NOTE — H&P (Signed)
[facility] History and Physical  Bebe Basa ZOX:[id number] DOB: 1960-08-02 DOA: 02/07/2012  Referring physician: Ranae Palms, MD  PCP: Geraldo Pitter, MD   Chief Complaint: Acute SOB  HPI: Shenelle Hager is a 51 y.o. female with multiple respiratory co morbidities presented to [facility] 9/24 with Myalgias, sore throat and felt SOB at about 22:00 9/23.  Yesterday am also claimed to have felt really nauseated and took a gel cap of Ibuprofen in the morning-her throat was also hurting. Patient lives with her mother, who has been sick recentyl with a virus last week-she seems to have taken a Z-Pack  Mothers symptoms stopped Friday or Saturday This seemed similar to her preserntation in 08/2011 "where her breathing gets out of whack"  She states perfect compliance with her Nebulasier and complies with her COPD meds. She uses Albuterol every 6-8 hours.  She uses oxygen at home about 3 l/min daily -occasionally needs to use about 5Liters as well. Patient has a past medical history of significant substance abuse and has been clean for the past 3 years she states.    In the emergency room patient was placed on BiPAP transiently and was subsequently weaned to 3 L nasal cannula 1 view chest x-ray showed mild chronic left basilar atelectasis Blood gas done while on aerosol mask showed a pH of 7.466 PaO2 of 123 bicarbonate of 25.0 which is somewhat concordant with her regular values as per last ABG 06/15/2009 Basic metabolic panel was completely normal i-STAT troponin was less than 0.30 Patient had a mild leukocytosis 10.8 with predominant neutrophilia of 79%  Patient received one dose of IV Solu-Medrol 125 mg IV in the emergency room  Respiratory virus panel and strep was ordered and is pending   Review of Systems: no PND, no Lower extremity swelling, , no tightness of shoes or rings, +knee is swollen, NO dyurisa, no dark or tarry stool, no vomiting, +Back pain, NO blurred or double vision, no  weakness, no falls, + dizzyness-when she gts "blurry" in her vision and no room spinning or blacking out sensation  Past Medical History  Diagnosis Date  . Chronic headache   . GERD (gastroesophageal reflux disease)   . Chronic respiratory failure     O2 at home with exertion  . COPD (chronic obstructive pulmonary disease)     severe per chart  . Substance abuse   . Hiatal hernia    Chart review  Admission 10/21/99 for PNA/Anemia  Admit COPD/Asthma  Cocaine abuse/Tobacco abuse  Admit 01/22/2006-COPD/ETOH/Polysubstance  Admit 06/15/2006 ETOH withdrawal  Admit 08/08/06 L sided PNa/Pulm edema-Hepatomegally on abd CT  Admission 08/20/2006 L complicated PNa with CP-Myoview scheduled but not completed-EF that admit 60%-Ct that admit no PE  Past Surgical History  Procedure Date  . Hernia repair 02/2009   Social History:  reports that she quit smoking about 3 years ago. Her smoking use included Cigarettes. She has a 20 pack-year smoking history. She has never used smokeless tobacco. She reports that she drinks alcohol. She reports that she does not use illicit drugs.  History   Social History Narrative   Lives with mom in [place].Used to be a Conservation officer, nature at [employer] priro to disability-is disabled due to LBP since the year [facility] -[phone]   Allergies  Allergen Reactions  . Ibuprofen     Shortness of breath   . Tylenol (Acetaminophen)     Shortness of breath     Family History  Problem Relation Age of Onset  .  Heart disease Father     Prior to Admission medications   Medication Sig Start Date End Date Taking? Authorizing Provider  albuterol (PROVENTIL HFA;VENTOLIN HFA) 108 (90 BASE) MCG/ACT inhaler Inhale 2 puffs into the lungs every 6 (six) hours as needed. Wheezing 09/12/11 09/11/12 Yes Osvaldo Shipper, MD  albuterol (PROVENTIL) (2.5 MG/3ML) 0.083% nebulizer solution Take 2.5 mg by nebulization every 6 (six) hours. Resp. Distress 09/12/11  Yes Osvaldo Shipper, MD    budesonide-formoterol [facility]) 160-4.5 MCG/ACT inhaler Inhale 2 puffs into the lungs 2 (two) times daily.     Yes Historical Provider, MD  tiotropium (SPIRIVA) 18 MCG inhalation capsule Place 18 mcg into inhaler and inhale daily.     Yes Historical Provider, MD   Physical Exam: Filed Vitals:   02/07/12 1125 02/07/12 1142 02/07/12 1216 02/07/12 1330  BP:   119/76 106/71  Pulse: 107  106 107  Temp:      TempSrc:      Resp: 25  26 22   SpO2: 98% 100% 95% 96%     General:  Pleasant oriented African American female Morbid obesity, There is no height or weight on file to calculate BMI.  Eyes: No icterus or pallor, Mallampati stage III-not able to appreciate back of the throat  ENT: Throat not appreciable at the back, no turbinate hypertrophy or rhinorrhea  Neck: Soft, supple, no JVD  Cardiovascular: S1-S2 no murmur rub or gallop, slightly tachycardic  Respiratory: Decreased breath sounds throughout with poor air entry bibasilar posterior lobes of the lung  Abdomen: Soft nontender nondistended no rebound or guarding  Skin: No rash  Musculoskeletal: Range of motion intact with no specific swelling of joints at present-does complain of chronic back pain  Psychiatric: Euthymic  Neurologic: Motor 5/5  Labs on Admission:  Basic Metabolic Panel:  Lab 02/07/12 1610  NA 135  K 3.9  CL 98  CO2 21  GLUCOSE 94  BUN 6  CREATININE 0.78  CALCIUM 9.2  MG --  PHOS --   Liver Function Tests:  Lab 02/07/12 1113  AST 17  ALT 12  ALKPHOS 93  BILITOT 0.5  PROT 7.8  ALBUMIN 4.1   No results found for this basename: LIPASE:5,AMYLASE:5 in the last 168 hours No results found for this basename: AMMONIA:5 in the last 168 hours CBC:  Lab 02/07/12 1113  WBC 10.8*  NEUTROABS 8.5*  HGB 13.0  HCT 38.7  MCV 86.0  PLT 259   Cardiac Enzymes:  Lab 02/07/12 1113  CKTOTAL --  CKMB --  CKMBINDEX --  TROPONINI <0.30    BNP (last 3 results) No results found for this basename:  PROBNP:3 in the last 8760 hours CBG: No results found for this basename: GLUCAP:5 in the last 168 hours  Radiological Exams on Admission: Dg Chest Port 1 View  02/07/2012  *RADIOLOGY REPORT*  Clinical Data: Shortness of breath  PORTABLE CHEST - 1 VIEW  Comparison: Portable exam 1136 hours compared to 09/08/2011  Findings: Normal heart size, mediastinal contours, and pulmonary vascularity. Mild chronic left basilar atelectasis. Lungs otherwise clear. No pleural effusion or pneumothorax. No acute osseous findings.  IMPRESSION: Mild chronic left basilar atelectasis.   Original Report Authenticated By: Lollie Marrow, M.D.     EKG: Independently reviewed. Normal sinus rhythm, mildly tachycardic PR interval = 0.12, QRS axis 70, potential criteria for LVH with ST straightening in V2 and 5  Assessment/Plan Principal Problem:  *Viral respiratory illness Active Problems:  COPD (chronic obstructive pulmonary disease)  GERD (  gastroesophageal reflux disease)  Hiatal hernia  Chronic headache  Acute-on-chronic respiratory failure   1. Probable viral illness-we will get a rapid strep and a Flu panel. Will repeat x-ray this time is a two-view chest x-ray 02/08/12 am. Given patient has begun neutrophilia I am concerned about this being a bacterial process-review her chart reveals multiple pneumonic admissions-she potentially might have strep throat as her mother was recently treated with a Z-Pak for potential strep throat even though her mother was told this was a viral illness-her myalgias support some sort of infectious process. I agree with community acquired pneumonia coverage until we can determine if this is actually pneumonia or not. 2.  Acute respiratory failure type I-sats were in the 80s on admission to the ED-close and careful monitoring 3. Reflux-potentially needs a PPI-if she has COPD uncontrolled reflux may contribute 4. Stage II to 3 Gold criteria COPD-she had PFTs done sometime in 2012 showing  severe airflow obstruction, no ejection and severe decrease in DLCO-get a 6 minute walk test tomorrow morning-increase albuterol dosage 25 mg nebs every 4 hourly, continue he felt prednisone 80 mg every 6 hourly, continue ipratropium super 5 nebulizations twice a day--would consider flu and pneumonia vaccines if not read administered by PCP 5. Dizziness-unclear if this is secondary to hypoxia versus undiagnosed coronary equivalent-cycle cardiac enzymes x3 6. Morbid obesity-outpatient counseling has been done by Dr. Rosaria Ferries a contributor to her restrictive lung disease with an obstructive component 7. ? Sleep apnea Mallampati is 3-outpatient sleep study must be considered 8. Heparin and Pepcid ordered for prophylaxis                                                    Code Status:  full  Family Communication:  updated mother and patient at bedside  Disposition Plan:  inpatient-will alert her pulmonologist to this patient's admission  Time spent:  70 minutes   Mahala Menghini [facility] Pager [phone]   If 7PM-7AM, please contact night-coverage [email or web address] Password [facility] 02/07/2012, 2:53 PM

## 2012-02-07 NOTE — ED Notes (Signed)
BIPAP off at 1330 RT aware

## 2012-02-07 NOTE — Progress Notes (Addendum)
[name], is a 51 y.o. female,   MRN: [id number]  -  DOB - [birth year]  Outpatient Primary MD for the patient is Geraldo Pitter, MD  in for    Chief Complaint  Patient presents with  . Shortness of Breath     Blood pressure 119/76, pulse 106, temperature 99 F (37.2 C), temperature source Oral, resp. rate 26, SpO2 95.00%.  Principal Problem:  *Acute-on-chronic respiratory failure Active Problems:  COPD (chronic obstructive pulmonary disease)  GERD (gastroesophageal reflux disease)  Hiatal hernia  Chronic headache  51 yo hx as above presents to [facility] ED cc sob. Reports sob began last evening. Used home nebs without improvement. Associated symptoms include chills, productive cough sore throat and generalized pain. She is on 3L o2 at home.   Work up in ED yields ABG ph 7.46, co2 35.1, 02 123.0, bicarb 25. wc 10.8, temp 99. Chest xray Mild chronic left basilar atelectasis.   In ED given continuous neb with solumedrol as well as levaquin. Pt tachypnic requiring Bipap to maintain sats.   On exam pt alert, moderate to severe increased work of breathing. Bipap intact. BS distant but clear. No wheeze, rhonchi. ED MD to trial off of Bipap. Will re-assess to determine type of bed based on respiratory status on [place]   2:10pm. Pt on 3L [place] for last 20 minutes. Resting comfortably. Normal resp. Effort at rest. sats 99%.   Will admit to tele.

## 2012-02-08 ENCOUNTER — Inpatient Hospital Stay (HOSPITAL_COMMUNITY): Payer: Medicare Other

## 2012-02-08 DIAGNOSIS — J441 Chronic obstructive pulmonary disease with (acute) exacerbation: Secondary | ICD-10-CM | POA: Diagnosis not present

## 2012-02-08 DIAGNOSIS — K219 Gastro-esophageal reflux disease without esophagitis: Secondary | ICD-10-CM | POA: Diagnosis not present

## 2012-02-08 DIAGNOSIS — K449 Diaphragmatic hernia without obstruction or gangrene: Secondary | ICD-10-CM

## 2012-02-08 DIAGNOSIS — J9819 Other pulmonary collapse: Secondary | ICD-10-CM | POA: Diagnosis not present

## 2012-02-08 DIAGNOSIS — J9620 Acute and chronic respiratory failure, unspecified whether with hypoxia or hypercapnia: Secondary | ICD-10-CM

## 2012-02-08 LAB — PROTIME-INR: INR: 1.06 (ref 0.00–1.49)

## 2012-02-08 LAB — EXPECTORATED SPUTUM ASSESSMENT W GRAM STAIN, RFLX TO RESP C

## 2012-02-08 LAB — CBC
HCT: 35.7 % — ABNORMAL LOW (ref 36.0–46.0)
MCH: 28.9 pg (ref 26.0–34.0)
MCHC: 33.6 g/dL (ref 30.0–36.0)
RDW: 14.1 % (ref 11.5–15.5)

## 2012-02-08 LAB — GLUCOSE, CAPILLARY

## 2012-02-08 LAB — COMPREHENSIVE METABOLIC PANEL
ALT: 11 U/L (ref 0–35)
Alkaline Phosphatase: 82 U/L (ref 39–117)
CO2: 24 mEq/L (ref 19–32)
GFR calc Af Amer: >90 mL/min (ref >90)
GFR calc non Af Amer: >90 mL/min (ref >90)
Glucose, Bld: 164 mg/dL — ABNORMAL HIGH (ref 70–99)
Potassium: 3.9 mEq/L (ref 3.5–5.1)
Sodium: 137 mEq/L (ref 135–145)
Total Protein: 6.9 g/dL (ref 6.0–8.3)

## 2012-02-08 MED ORDER — METHYLPREDNISOLONE SODIUM SUCC 125 MG IJ SOLR
80.0000 mg | Freq: Two times a day (BID) | INTRAMUSCULAR | Status: DC
  Filled 2012-02-08: qty 1.28

## 2012-02-08 MED ORDER — INFLUENZA VIRUS VACC SPLIT PF IM SUSP
0.5000 mL | INTRAMUSCULAR | Status: AC
  Administered 2012-02-09: 0.5 mL via INTRAMUSCULAR
  Filled 2012-02-08: qty 0.5

## 2012-02-08 MED ORDER — ALBUTEROL SULFATE (5 MG/ML) 0.5% IN NEBU
2.5000 mg | INHALATION_SOLUTION | RESPIRATORY_TRACT | Status: DC
  Administered 2012-02-08 – 2012-02-09 (×6): 2.5 mg via RESPIRATORY_TRACT
  Filled 2012-02-08 (×7): qty 0.5

## 2012-02-08 NOTE — Progress Notes (Signed)
[facility] PROGRESS NOTE  [name] WUJ:[id number] DOB: 1961-05-13 DOA: 02/07/2012 PCP: Geraldo Pitter, MD  Assessment/Plan: 1-shortness of breath: Most likely secondary to see on exacerbation appears to be due to bronchiectasis versus early pneumonia. At this moment we'll continue nebulizer treatment, continue steroids, continue antibiotics. Patient is improving and most likely home in one or 2 days.  2-COPD (chronic obstructive pulmonary disease): Treatment as mentioned above.  3-GERD (gastroesophageal reflux disease): Continue PPI.  4-Hiatal hernia: No signs of incarceration at this point. Will continue treatment with PPI as the main symptom is reflux disease.   5-Acute on chronic respiratory failure: Secondary to COPD exacerbation. Treatment as mentioned above. Will also continue oxygen supplementation. Patient at home uses 2 L intermittently. She might benefit at least over the next couple of weeks as part of her recovery phase to use it 24/7.  DVT: Heparin   Code Status: Full Family Communication: No family at bedside Disposition Plan: Home when medically stable   Brief narrative: 51 year old female with multiple medical problems admitted secondary to increase respiratory difficulties and also myalgias/sore throat. Patient reports that she has been wheezing and requiring more oxygen that what she uses at home.  Procedures:  Chest x-ray: Bibasilar atelectasis/pneumonia, chronic COPD changes.  Antibiotics:  Levaquin  HPI/Subjective: Afebrile, patient reports significant improvement in her breathing. At this point still with decreased amount of air through her lungs but no wheezing. Will start tapering her steroids [name] IV), continue nebulizer and follow clinical response. She denies chest pain or any other complaints.  Objective: Filed Vitals:   02/08/12 0012 02/08/12 0454 02/08/12 0603 02/08/12 0749  BP:   117/72   Pulse:   94   Temp:   97.7 F (36.5 C)    TempSrc:   Oral   Resp:   16   Height:      Weight:      SpO2: 97% 98% 96% 94%    Intake/Output Summary (Last 24 hours) at 02/08/12 1414 Last data filed at 02/08/12 0900  Gross per 24 hour  Intake    480 ml  Output      0 ml  Net    480 ml   Filed Weights   02/07/12 1613  Weight: 88.451 kg (195 lb)    Exam:   General:  No acute distress, afebrile and breathing better. Almost able to speak in full sentences.  Cardiovascular: Mild tachycardia, no rubs or gallops; no murmurs.  Respiratory: Decreased breath sounds bilaterally, currently no wheezing (status post nebulizer treatment), scattered rhonchi.  Abdomen: Soft, nontender/nondistended, positive bowel sounds; no vomiting.  Extremities: No cyanosis or edema  Neuro: Nonfocal deficit.  Data Reviewed: Basic Metabolic Panel:  Lab 02/08/12 9562 02/07/12 1113  NA 137 135  K 3.9 3.9  CL 103 98  CO2 24 21  GLUCOSE 164* 94  BUN 8 6  CREATININE 0.64 0.78  CALCIUM 8.9 9.2  MG -- --  PHOS -- --   Liver Function Tests:  Lab 02/08/12 0428 02/07/12 1113  AST 12 17  ALT 11 12  ALKPHOS 82 93  BILITOT 0.2* 0.5  PROT 6.9 7.8  ALBUMIN 3.4* 4.1   CBC:  Lab 02/08/12 0428 02/07/12 1113  WBC 12.6* 10.8*  NEUTROABS -- 8.5*  HGB 12.0 13.0  HCT 35.7* 38.7  MCV 86.0 86.0  PLT 237 259   Cardiac Enzymes:  Lab 02/07/12 1113  CKTOTAL --  CKMB --  CKMBINDEX --  TROPONINI <0.30   CBG:  Lab  02/08/12 0808  GLUCAP 167*    Recent Results (from the past 240 hour(s))  RAPID STREP SCREEN     Status: Normal   Collection Time   02/07/12  3:57 PM      Component Value Range Status Comment   Streptococcus, Group A Screen (Direct) NEGATIVE  NEGATIVE Final   CULTURE, EXPECTORATED SPUTUM-ASSESSMENT     Status: Normal   Collection Time   02/07/12  5:36 PM      Component Value Range Status Comment   Specimen Description SPUTUM   Final    Special Requests Normal   Final    Sputum evaluation     Final    Value: MICROSCOPIC  FINDINGS SUGGEST THAT THIS SPECIMEN IS NOT REPRESENTATIVE OF LOWER RESPIRATORY SECRETIONS. PLEASE RECOLLECT.     Gram Stain Report Called to,Read Back By and Verified With: L.VERGELDEDIOS/[id number]/[email or web address]   Report Status 02/07/2012 FINAL   Final   CULTURE, EXPECTORATED SPUTUM-ASSESSMENT     Status: Normal   Collection Time   02/08/12  3:52 AM      Component Value Range Status Comment   Specimen Description SPUTUM   Final    Special Requests Normal   Final    Sputum evaluation     Final    Value: MICROSCOPIC FINDINGS SUGGEST THAT THIS SPECIMEN IS NOT REPRESENTATIVE OF LOWER RESPIRATORY SECRETIONS. PLEASE RECOLLECT.     Gram Stain Report Called to,Read Back By and Verified With: LVERGELDEDIOS RN AT 0428 ON [id number] BY DLONG   Report Status 02/08/2012 FINAL   Final      Studies: Dg Chest 2 View  02/08/2012  *RADIOLOGY REPORT*  Clinical Data: Cough, shortness of breath  CHEST - 2 VIEW  Comparison: 09/08/2011  Findings: Normal heart size and vascularity.  Minimal basilar atelectasis without focal pneumonia, effusion, or pneumothorax. Trachea midline.  Mild scoliosis of the spine.  IMPRESSION: Basilar atelectasis.   Original Report Authenticated By: Judie Petit. Ruel Favors, M.D.    Dg Chest Port 1 View  02/07/2012  *RADIOLOGY REPORT*  Clinical Data: Shortness of breath  PORTABLE CHEST - 1 VIEW  Comparison: Portable exam 1136 hours compared to 09/08/2011  Findings: Normal heart size, mediastinal contours, and pulmonary vascularity. Mild chronic left basilar atelectasis. Lungs otherwise clear. No pleural effusion or pneumothorax. No acute osseous findings.  IMPRESSION: Mild chronic left basilar atelectasis.   Original Report Authenticated By: Lollie Marrow, M.D.     Scheduled Meds:   . albuterol  2.5 mg Nebulization Q4H  . budesonide-formoterol  2 puff Inhalation BID  . heparin  5,000 Units Subcutaneous Q8H  . levofloxacin (LEVAQUIN) IV  750 mg Intravenous Q24H  . methylPREDNISolone (SOLU-MEDROL)  injection  80 mg Intravenous Q12H  . tiotropium  18 mcg Inhalation Daily  . traMADol  50 mg Oral Q12H  . DISCONTD: albuterol  2.5 mg Nebulization Q4H  . DISCONTD: albuterol  5 mg Nebulization Q4H  . DISCONTD: influenza  inactive virus vaccine  0.5 mL Intramuscular Tomorrow-1000  . DISCONTD: methylPREDNISolone (SOLU-MEDROL) injection  80 mg Intravenous Q6H    Time spent: > 30 minutes    [name]  [facility] Pager [phone]. If 8PM-8AM, please contact night-coverage at [email or web address], password [facility] 02/08/2012, 2:14 PM  LOS: 1 day

## 2012-02-08 NOTE — Progress Notes (Signed)
   CARE MANAGEMENT NOTE 02/08/2012  Patient:  [name],[name]   Account Number:  [id number]  Date Initiated:  02/08/2012  Documentation initiated by:  Jiles Crocker  Subjective/Objective Assessment:   ADMITTED WITH Probable viral illness     Action/Plan:   PCP: Geraldo Pitter, MD  LIVES WITH FAMILY MEMBERS, HAS HOME 02   Anticipated DC Date:  02/15/2012   Anticipated DC Plan:  HOME/SELF CARE      DC Planning Services  CM consult               Status of service:  In process, will continue to follow Medicare Important Message given?  NA - LOS <3 / Initial given by admissions (If response is "NO", the following Medicare IM given date fields will be blank)  Per UR Regulation:  Reviewed for med. necessity/level of care/duration of stay  Comments:  02/08/2012- B [place] Oborn RN, BSN, MHA

## 2012-02-09 DIAGNOSIS — J441 Chronic obstructive pulmonary disease with (acute) exacerbation: Secondary | ICD-10-CM | POA: Diagnosis not present

## 2012-02-09 DIAGNOSIS — D72829 Elevated white blood cell count, unspecified: Secondary | ICD-10-CM

## 2012-02-09 DIAGNOSIS — K219 Gastro-esophageal reflux disease without esophagitis: Secondary | ICD-10-CM | POA: Diagnosis not present

## 2012-02-09 DIAGNOSIS — J9620 Acute and chronic respiratory failure, unspecified whether with hypoxia or hypercapnia: Secondary | ICD-10-CM | POA: Diagnosis not present

## 2012-02-09 LAB — BASIC METABOLIC PANEL
CO2: 26 mEq/L (ref 19–32)
Chloride: 102 mEq/L (ref 96–112)
Creatinine, Ser: 0.76 mg/dL (ref 0.50–1.10)
GFR calc Af Amer: >90 mL/min (ref >90)
Potassium: 3.7 mEq/L (ref 3.5–5.1)
Sodium: 138 mEq/L (ref 135–145)

## 2012-02-09 LAB — CBC
MCV: 87.4 fL (ref 78.0–100.0)
Platelets: 281 10*3/uL (ref 150–400)
RBC: 3.97 MIL/uL (ref 3.87–5.11)
WBC: 17.3 10*3/uL — ABNORMAL HIGH (ref 4.0–10.5)

## 2012-02-09 LAB — GLUCOSE, CAPILLARY: Glucose-Capillary: 124 mg/dL — ABNORMAL HIGH (ref 70–99)

## 2012-02-09 MED ORDER — PREDNISONE 20 MG PO TABS: ORAL_TABLET | ORAL | Status: DC

## 2012-02-09 MED ORDER — LEVOFLOXACIN 500 MG PO TABS: 500.0000 mg | ORAL_TABLET | Freq: Every day | ORAL | Status: AC

## 2012-02-09 MED ORDER — BUDESONIDE-FORMOTEROL FUMARATE 160-4.5 MCG/ACT IN AERO: 2.0000 | INHALATION_SPRAY | Freq: Two times a day (BID) | RESPIRATORY_TRACT | Status: DC

## 2012-02-09 NOTE — Discharge Summary (Signed)
Physician Discharge Summary  [name] WUJ:[id number] DOB: Oct 02, 1960 DOA: 02/07/2012  PCP: Geraldo Pitter, MD  Admit date: 02/07/2012 Discharge date: 02/09/2012  Recommendations for Outpatient Follow-up:  1. PCP follow up in 10 weeks (follow CBC to recheck WBC's; follow symptom resolution and medication adjustment of chronic conditions)  Discharge Diagnoses:  Principal Problem:  *Viral respiratory illness Active Problems:  COPD (chronic obstructive pulmonary disease)  GERD (gastroesophageal reflux disease)  Hiatal hernia  Chronic headache  Acute-on-chronic respiratory failure   Discharge Condition: stable and improved. Will finish antibiotics and steroids as instructed; will arrange follow up with PCP and pulmonologist as an outpatient.  Diet recommendation: low calorie diet  Filed Weights   02/07/12 1613  Weight: 88.451 kg (195 lb)    History of present illness:  51 year old female with multiple medical problems admitted secondary to increase respiratory difficulties and also myalgias/sore throat. Patient reports that she has been wheezing and requiring more oxygen that what she uses at home.   Hospital Course:  1-shortness of breath: Most likely secondary to COPD exacerbation; appears to be due to bronchiectasis versus early pneumonia. At this moment will continue nebulizer treatment, continue PO tapering steroids, continue antibiotics. Patient breathing back to baseline; she will continue oxygen supplementation and will follow with PCP and pulmonologist as an outpatient.   2-COPD (chronic obstructive pulmonary disease): Treatment as mentioned above. Continue symbicort and albuterol. Prednisone tapering and oxygen supplementation.   3-GERD (gastroesophageal reflux disease): Continue PPI.   4-Hiatal hernia: No signs of incarceration at this point. Will continue treatment with PPI as the main symptom is reflux disease.   5-Acute on chronic respiratory failure: Secondary  to COPD exacerbation. Treatment as mentioned above. Will also continue oxygen supplementation. Patient at home uses 2 L intermittently; but per Dr Shelle Iron notes is supposed to be 24/7. Patient was educated about needs of oxygen and will follow with pulmonologist in 14 days.  6- Leukocytosis: due to steroids and infection; will continue abx's and expecting resolution once steroids tapered.   Discharge Exam: Filed Vitals:   02/09/12 0054 02/09/12 0419 02/09/12 0635 02/09/12 0931  BP:   117/70   Pulse:   81   Temp:   97.8 F (36.6 C)   TempSrc:   Oral   Resp:   20   Height:      Weight:      SpO2: 96% 97% 96% 97%    General: NAD; breathing back to baseline; afebrile Cardiovascular: RRR, no rubs or gallops Respiratory: good air movement; minimally exp wheezing; no rhonchi and no rales Abdomen: soft, NT, ND; positive BS Neuro: non focal  Discharge Instructions  Discharge Orders    Future Appointments: Provider: Department: Dept Phone: Center:   03/23/2012 9:30 AM Barbaraann Share, MD [facility] [phone] None     Future Orders Please Complete By Expires   Discharge instructions      Comments:   -Take medications as prescribed -Arrange follow up with PCP in 7-10 days -Arrange follow up with pulmonologist in 14 days -use your oxygen all the time -Keep yourself well hydrated       Medication List     As of 02/09/2012 11:43 AM    TAKE these medications         albuterol (2.5 MG/3ML) 0.083% nebulizer solution   Commonly known as: PROVENTIL   Take 2.5 mg by nebulization every 6 (six) hours. Resp. Distress      albuterol 108 (90 BASE) MCG/ACT inhaler   Commonly  known as: PROVENTIL HFA;VENTOLIN HFA   Inhale 2 puffs into the lungs every 6 (six) hours as needed. Wheezing      budesonide-formoterol 160-4.5 MCG/ACT inhaler   Commonly known as: SYMBICORT   Inhale 2 puffs into the lungs 2 (two) times daily.      levofloxacin 500 MG tablet   Commonly known as: LEVAQUIN    Take 1 tablet (500 mg total) by mouth daily.      predniSONE 20 MG tablet   Commonly known as: DELTASONE   Take 4 tablet by mouth daily X1 day, then 3 tablets by mouth daily X 2 days; then 2 tablets by mouth daily X 2 days; then 1 tablet by mouth daily X 2 days; then 1/2 tablet by mouth daily X 2 days and stop prednisone      tiotropium 18 MCG inhalation capsule   Commonly known as: SPIRIVA   Place 18 mcg into inhaler and inhale daily.           Follow-up Information    Follow up with Geraldo Pitter, MD. Schedule an appointment as soon as possible for a visit in 10 days.   Contact information:   [address] [place] [address] [phone]       Follow up with Barbaraann Share, MD. Schedule an appointment as soon as possible for a visit in 14 days.   Contact information:   [address] [facility] [place] [address] [phone]           The results of significant diagnostics from this hospitalization (including imaging, microbiology, ancillary and laboratory) are listed below for reference.    Significant Diagnostic Studies: Dg Chest 2 View  02/08/2012  *RADIOLOGY REPORT*  Clinical Data: Cough, shortness of breath  CHEST - 2 VIEW  Comparison: 09/08/2011  Findings: Normal heart size and vascularity.  Minimal basilar atelectasis without focal pneumonia, effusion, or pneumothorax. Trachea midline.  Mild scoliosis of the spine.  IMPRESSION: Basilar atelectasis.   Original Report Authenticated By: Judie Petit. Ruel Favors, M.D.    Dg Chest Port 1 View  02/07/2012  *RADIOLOGY REPORT*  Clinical Data: Shortness of breath  PORTABLE CHEST - 1 VIEW  Comparison: Portable exam 1136 hours compared to 09/08/2011  Findings: Normal heart size, mediastinal contours, and pulmonary vascularity. Mild chronic left basilar atelectasis. Lungs otherwise clear. No pleural effusion or pneumothorax. No acute osseous findings.  IMPRESSION: Mild chronic left basilar  atelectasis.   Original Report Authenticated By: Lollie Marrow, M.D.     Microbiology: Recent Results (from the past 240 hour(s))  RAPID STREP SCREEN     Status: Normal   Collection Time   02/07/12  3:57 PM      Component Value Range Status Comment   Streptococcus, Group A Screen (Direct) NEGATIVE  NEGATIVE Final   CULTURE, EXPECTORATED SPUTUM-ASSESSMENT     Status: Normal   Collection Time   02/07/12  5:36 PM      Component Value Range Status Comment   Specimen Description SPUTUM   Final    Special Requests Normal   Final    Sputum evaluation     Final    Value: MICROSCOPIC FINDINGS SUGGEST THAT THIS SPECIMEN IS NOT REPRESENTATIVE OF LOWER RESPIRATORY SECRETIONS. PLEASE RECOLLECT.     Gram Stain Report Called to,Read Back By and Verified With: L.VERGELDEDIOS/[id number]/[email or web address]   Report Status 02/07/2012 FINAL   Final   CULTURE, EXPECTORATED SPUTUM-ASSESSMENT     Status: Normal  Collection Time   02/08/12  3:52 AM      Component Value Range Status Comment   Specimen Description SPUTUM   Final    Special Requests Normal   Final    Sputum evaluation     Final    Value: MICROSCOPIC FINDINGS SUGGEST THAT THIS SPECIMEN IS NOT REPRESENTATIVE OF LOWER RESPIRATORY SECRETIONS. PLEASE RECOLLECT.     Gram Stain Report Called to,Read Back By and Verified With: LVERGELDEDIOS RN AT 0428 ON [id number] BY DLONG   Report Status 02/08/2012 FINAL   Final      Labs: Basic Metabolic Panel:  Lab 02/09/12 0454 02/08/12 0428 02/07/12 1113  NA 138 137 135  K 3.7 3.9 3.9  CL 102 103 98  CO2 26 24 21   GLUCOSE 123* 164* 94  BUN 12 8 6   CREATININE 0.76 0.64 0.78  CALCIUM 8.8 8.9 9.2  MG -- -- --  PHOS -- -- --   Liver Function Tests:  Lab 02/08/12 0428 02/07/12 1113  AST 12 17  ALT 11 12  ALKPHOS 82 93  BILITOT 0.2* 0.5  PROT 6.9 7.8  ALBUMIN 3.4* 4.1   CBC:  Lab 02/09/12 0503 02/08/12 0428 02/07/12 1113  WBC 17.3* 12.6* 10.8*  NEUTROABS -- -- 8.5*  HGB 11.7* 12.0 13.0  HCT 34.7* 35.7*  38.7  MCV 87.4 86.0 86.0  PLT 281 237 259   Cardiac Enzymes:  Lab 02/07/12 1113  CKTOTAL --  CKMB --  CKMBINDEX --  TROPONINI <0.30   CBG:  Lab 02/09/12 0802 02/08/12 0808  GLUCAP 124* 167*    Time coordinating discharge: > 30 minutes  Signed:  Debbera Wolken  [facility] 02/09/2012, 11:43 AM

## 2012-02-09 NOTE — Progress Notes (Signed)
Nutrition Brief Note  Patient identified on the Malnutrition Screening Tool (MST) report for weight loss and decreased appetite, generating a score of 3.   Body mass index is 34.54 kg/(m^2). Pt meets criteria for obesity grade 1 based on current BMI.   Pt reports intentional weight loss prior to admit secondary to increased activity.  Current diet order is Heart Healthy, patient is consuming approximately 100% of meals at this time. Labs and medications reviewed.   No nutrition interventions warranted at this time. If nutrition issues arise, please consult RD.   Oran Rein, RD, LDN Clinical Inpatient Dietitian Pager:  [phone] Weekend and after hours pager:  [phone]

## 2012-03-16 ENCOUNTER — Ambulatory Visit: Payer: Medicare Other | Admitting: Pulmonary Disease

## 2012-03-23 ENCOUNTER — Ambulatory Visit (INDEPENDENT_AMBULATORY_CARE_PROVIDER_SITE_OTHER): Payer: Medicare Other | Admitting: Pulmonary Disease

## 2012-03-23 ENCOUNTER — Encounter: Payer: Self-pay | Admitting: Pulmonary Disease

## 2012-03-23 VITALS — BP 118/82 | HR 112 | Temp 97.7°F | Ht 63.0 in | Wt 198.0 lb

## 2012-03-23 DIAGNOSIS — J9610 Chronic respiratory failure, unspecified whether with hypoxia or hypercapnia: Secondary | ICD-10-CM

## 2012-03-23 DIAGNOSIS — J449 Chronic obstructive pulmonary disease, unspecified: Secondary | ICD-10-CM | POA: Diagnosis not present

## 2012-03-23 NOTE — Patient Instructions (Addendum)
No change in current medications Work on weight loss and some type of exercise program followup with me in 4mos if doing well.

## 2012-03-23 NOTE — Assessment & Plan Note (Signed)
The patient feels she is at a stable baseline, and his remaining compliant with her bronchodilator therapy and oxygen.  I have asked her to work or on weight loss and conditioning, and unfortunately she is not able to afford pulmonary rehabilitation.  She has already had her flu shot this year.  I think we're doing everything we can medically, and ultimately she is going to need referral to [facility] for possible lung transplant.  However, I have explained to her they will require that she is near her ideal body weight and participating in some kind of consistent exercise program.

## 2012-03-23 NOTE — Progress Notes (Signed)
  Subjective:    Patient ID: [name], female    DOB: 09/05/1960, 51 y.o.   MRN: [id number]  HPI The patient comes in today for followup of her severe COPD with chronic respiratory failure.  She was in the hospital in September of this year with a questionable COPD exacerbation.  She feels that she is back to her usual baseline, that this include significant dyspnea on exertion.  Unfortunately, she has not been able to for pulmonary rehabilitation.  She has also not been able to lose weight.  She states that she has been compliant on her bronchodilator regimen as well as her supplemental oxygen.  She denies any chest congestion or purulent mucus currently.   Review of Systems  Constitutional: Negative for fever and unexpected weight change.  HENT: Negative for ear pain, nosebleeds, congestion, sore throat, rhinorrhea, sneezing, trouble swallowing, dental problem, postnasal drip and sinus pressure.   Eyes: Negative for redness and itching.  Respiratory: Positive for shortness of breath. Negative for cough, chest tightness and wheezing.   Cardiovascular: Negative for palpitations and leg swelling.  Gastrointestinal: Negative for nausea and vomiting.  Genitourinary: Negative for dysuria.  Musculoskeletal: Negative for joint swelling.  Skin: Negative for rash.  Neurological: Negative for headaches.  Hematological: Does not bruise/bleed easily.  Psychiatric/Behavioral: Positive for dysphoric mood. The patient is not nervous/anxious.        Objective:   Physical Exam Morbidly obese female in no acute distress Nose without purulent discharge noted Neck without lymphadenopathy or thyromegaly Chest with decreased breath sounds throughout, no wheezes or rhonchi Cardiac exam with regular rate and rhythm Lower extremities with minimal edema, no cyanosis Alert and oriented, moves all 4 extremities.       Assessment & Plan:

## 2012-04-30 ENCOUNTER — Telehealth: Payer: Self-pay | Admitting: Pulmonary Disease

## 2012-04-30 MED ORDER — ALBUTEROL SULFATE HFA 108 (90 BASE) MCG/ACT IN AERS: 2.0000 | INHALATION_SPRAY | Freq: Four times a day (QID) | RESPIRATORY_TRACT | Status: DC | PRN

## 2012-04-30 NOTE — Telephone Encounter (Signed)
Rx has been sent in per [facility], pt aware.

## 2012-05-18 DIAGNOSIS — J449 Chronic obstructive pulmonary disease, unspecified: Secondary | ICD-10-CM | POA: Diagnosis not present

## 2012-05-18 DIAGNOSIS — I10 Essential (primary) hypertension: Secondary | ICD-10-CM | POA: Diagnosis not present

## 2012-05-18 DIAGNOSIS — J4489 Other specified chronic obstructive pulmonary disease: Secondary | ICD-10-CM | POA: Diagnosis not present

## 2012-05-18 DIAGNOSIS — E119 Type 2 diabetes mellitus without complications: Secondary | ICD-10-CM | POA: Diagnosis not present

## 2012-07-27 ENCOUNTER — Ambulatory Visit (INDEPENDENT_AMBULATORY_CARE_PROVIDER_SITE_OTHER): Payer: Medicare Other | Admitting: Pulmonary Disease

## 2012-07-27 ENCOUNTER — Encounter: Payer: Self-pay | Admitting: Pulmonary Disease

## 2012-07-27 VITALS — BP 112/64 | HR 74 | Temp 97.7°F | Ht 63.0 in | Wt 196.8 lb

## 2012-07-27 DIAGNOSIS — J9610 Chronic respiratory failure, unspecified whether with hypoxia or hypercapnia: Secondary | ICD-10-CM

## 2012-07-27 DIAGNOSIS — J449 Chronic obstructive pulmonary disease, unspecified: Secondary | ICD-10-CM

## 2012-07-27 NOTE — Progress Notes (Signed)
  Subjective:    Patient ID: [name], female    DOB: [birth year], 52 y.o.   MRN: [id number]  HPI Patient comes in today for followup of her known COPD and chronic respiratory failure.  She is staying on her bronchodilator regimen compliantly, as well as her oxygen.  She is trying to stay active, but unfortunately was unable to afford pulmonary rehabilitation.  She has noted increased rescue inhaler use, primarily associated with exertional activities.  She has not had increased shortness of breath at rest.  She tells me that she is getting more short of breath with activities, and we'll take a nebulizer treatment for relief.  She is not sitting down to rest before reaching for her rescue medication.  She denies any chest congestion or significant cough.  She has had no chest discomfort, and has no known history of heart disease.   Review of Systems  Constitutional: Negative for fever and unexpected weight change.  HENT: Negative for ear pain, nosebleeds, congestion, sore throat, rhinorrhea, sneezing, trouble swallowing, dental problem, postnasal drip and sinus pressure.   Eyes: Negative for redness and itching.  Respiratory: Positive for cough and shortness of breath. Negative for chest tightness and wheezing.   Cardiovascular: Negative for palpitations and leg swelling.  Gastrointestinal: Negative for nausea and vomiting.  Genitourinary: Negative for dysuria.  Musculoskeletal: Negative for joint swelling.  Skin: Negative for rash.  Neurological: Negative for headaches.  Hematological: Does not bruise/bleed easily.  Psychiatric/Behavioral: Positive for dysphoric mood. The patient is nervous/anxious.        Objective:   Physical Exam Overweight female in no acute distress Nose without purulence or discharge noted Neck without lymphadenopathy or thyromegaly Chest with very diminished breath sounds, no wheezing. Cardiac exam with regular rate and rhythm, but distant heart  sounds Lower extremities with mild edema, no cyanosis Alert and oriented, moves all 4 extremities.       Assessment & Plan:

## 2012-07-27 NOTE — Patient Instructions (Addendum)
Before taking an extra nebulizer treatment, sit down and see if your breathing improves after 5 min.   You need to pace yourself during the day, and break up your chores during the week. No change in breathing medications Will refer you to [facility] for transplant evaluation.  I think they can give you good info. Work on some type of conditioning program If you continue to have significant shortness of breath that doesn't resolve with resting, may need a cardiac evaluation. followup with me in 4mos.

## 2012-07-27 NOTE — Assessment & Plan Note (Signed)
AAT 2012:  Normal level, MM PFT's 2012:  FEV1 1.01 (42%), ratio 46, TLC nl, DLCO 31% States she cannot afford pulmonary rehab.  On oxygen with exertion and while sleeping.   The patient appears to be at a stable baseline, but she is having some increased dyspnea on exertion.  She is overusing her rescue medication, and I passed her to sit down and take a break for a few minutes before reaching for her nebulizer treatment.  I always worry about superimposed cardiac disease, but the patient has no history of this.  If she continues to have significant issues that she feels are worsening, would consider an echocardiogram and noninvasive stress testing.  We have talked about a transplant evaluation in the past, but I have told her that she needs to be on a consistent exercise program and had significant weight loss.  I will go ahead and refer her for evaluation, and I hope they can give her valuable information and also outline specific goals.

## 2012-07-28 ENCOUNTER — Other Ambulatory Visit: Payer: Self-pay | Admitting: Pulmonary Disease

## 2012-08-01 ENCOUNTER — Telehealth: Payer: Self-pay | Admitting: Pulmonary Disease

## 2012-08-01 MED ORDER — ALBUTEROL SULFATE (2.5 MG/3ML) 0.083% IN NEBU: 2.5000 mg | INHALATION_SOLUTION | Freq: Four times a day (QID) | RESPIRATORY_TRACT | Status: DC

## 2012-08-01 NOTE — Telephone Encounter (Signed)
lmtcb x1 for pt w/ family member as she was not sure which pharmacy to send this medication and could not verify fax #/phone # to call

## 2012-08-01 NOTE — Telephone Encounter (Signed)
This Rx will be printed and placed on KC desk to sign. Patient aware that [facility] out of office until tomorrow 2pm and this will be signed upon his return. Fax to [employer] & fax # of [phone]

## 2012-08-01 NOTE — Telephone Encounter (Signed)
Pt returned triage's call.  Pt confirmed the pharmacy being [facility] & fax # of [phone].  [name]

## 2012-08-02 MED ORDER — ALBUTEROL SULFATE (2.5 MG/3ML) 0.083% IN NEBU: INHALATION_SOLUTION | RESPIRATORY_TRACT | Status: DC

## 2012-08-02 NOTE — Telephone Encounter (Signed)
Rewritten to states "Take 3 mLs (2.5 mg total) by nebulization every 6 (six) hours PRN Respiratory Distress" Faxed to [facility] # [phone]

## 2012-08-02 NOTE — Telephone Encounter (Signed)
Needs to be re-written

## 2012-09-09 DIAGNOSIS — F1411 Cocaine abuse, in remission: Secondary | ICD-10-CM

## 2012-09-09 HISTORY — DX: Cocaine abuse, in remission: F14.11

## 2012-09-17 ENCOUNTER — Telehealth: Payer: Self-pay | Admitting: *Deleted

## 2012-09-17 NOTE — Telephone Encounter (Signed)
Letter received inform Dr Shelle Iron of the denial from [facility] Transplant Team. Pt has also been sent a copy of this letter. After evaluation, they have requested before she can be approved she needs to: Lose 30lbs, obtain a sleep study, have a TTE completed and mentions the benefit of nocturnal BiPap.   This form will be given to [facility] to review.

## 2012-09-17 NOTE — Telephone Encounter (Signed)
A lot of the tests they mention need to be current, so would wait until she has lost the weight before we order them.  Regarding bipap, only a small proportion of COPD pts benefit from bipap, and in fact it makes a lot of them worse.   I think we should focus on her weight and conditioning at this point.

## 2012-09-19 NOTE — Telephone Encounter (Signed)
Pt aware of recs per KC.  Pt now has better understanding of what letter was talking about.  Pt states that she will work on the weight loss per the request of the letter. Nothing further needed.

## 2012-09-21 DIAGNOSIS — E669 Obesity, unspecified: Secondary | ICD-10-CM | POA: Diagnosis not present

## 2012-09-21 DIAGNOSIS — R7309 Other abnormal glucose: Secondary | ICD-10-CM | POA: Diagnosis not present

## 2012-09-21 DIAGNOSIS — J449 Chronic obstructive pulmonary disease, unspecified: Secondary | ICD-10-CM | POA: Diagnosis not present

## 2012-09-21 DIAGNOSIS — I10 Essential (primary) hypertension: Secondary | ICD-10-CM | POA: Diagnosis not present

## 2012-09-21 DIAGNOSIS — E119 Type 2 diabetes mellitus without complications: Secondary | ICD-10-CM | POA: Diagnosis not present

## 2012-11-04 ENCOUNTER — Other Ambulatory Visit: Payer: Self-pay | Admitting: Pulmonary Disease

## 2012-11-30 ENCOUNTER — Ambulatory Visit (INDEPENDENT_AMBULATORY_CARE_PROVIDER_SITE_OTHER): Payer: Medicare Other | Admitting: Pulmonary Disease

## 2012-11-30 ENCOUNTER — Encounter: Payer: Self-pay | Admitting: Pulmonary Disease

## 2012-11-30 VITALS — BP 118/88 | HR 87 | Temp 98.0°F | Ht 62.75 in | Wt 199.2 lb

## 2012-11-30 DIAGNOSIS — J449 Chronic obstructive pulmonary disease, unspecified: Secondary | ICD-10-CM | POA: Diagnosis not present

## 2012-11-30 NOTE — Patient Instructions (Addendum)
Stay on your inhaler medications. Work on your 30 pound weight loss in order to meet goal set by [facility] transplant service. Make sure you wear your oxygen all night while sleeping. followup with me in 6mos.

## 2012-11-30 NOTE — Assessment & Plan Note (Signed)
The patient seems to be doing very well from a COPD standpoint.  She has not had an acute exacerbation since last visit, and feels that her breathing is at baseline.  She denies any significant cough or mucus production.  I have asked her to work aggressively on some type of conditioning program, especially weight loss.  She will followup with me in 6 months doing well.

## 2012-11-30 NOTE — Progress Notes (Signed)
  Subjective:    Patient ID: [name], female    DOB: [birth year], 52 y.o.   MRN: [id number]  HPI The patient comes in today for followup of her known COPD with respiratory failure.  She has been evaluated by the transplant program, and felt to need a 30 pound weight loss before being considered.  The patient has been staying on her bronchodilator regimen, and oxygen at night most of the time.  She feels that her breathing is at her usual baseline, and denies having an acute exacerbation pulmonary infection since the last visit.   Review of Systems  Constitutional: Negative for fever and unexpected weight change.  HENT: Negative for ear pain, nosebleeds, congestion, sore throat, rhinorrhea, sneezing, trouble swallowing, dental problem, postnasal drip and sinus pressure.   Eyes: Negative for redness and itching.  Respiratory: Negative for cough, chest tightness, shortness of breath and wheezing.   Cardiovascular: Negative for palpitations and leg swelling.  Gastrointestinal: Negative for nausea and vomiting.  Genitourinary: Negative for dysuria.  Musculoskeletal: Negative for joint swelling.  Skin: Negative for rash.  Neurological: Negative for headaches.  Hematological: Does not bruise/bleed easily.  Psychiatric/Behavioral: Negative for dysphoric mood. The patient is not nervous/anxious.        Objective:   Physical Exam Obese female in no acute distress Nose without purulent discharge noted Neck without lymphadenopathy or thyromegaly Chest with decreased breath sounds, no wheezing Cardiac exam with regular rate and rhythm Lower extremities with minimal edema, no cyanosis Alert and oriented, moves all 4 extremities.       Assessment & Plan:

## 2013-02-01 DIAGNOSIS — Z1231 Encounter for screening mammogram for malignant neoplasm of breast: Secondary | ICD-10-CM | POA: Diagnosis not present

## 2013-03-18 ENCOUNTER — Other Ambulatory Visit: Payer: Self-pay | Admitting: Pulmonary Disease

## 2013-03-18 ENCOUNTER — Telehealth: Payer: Self-pay | Admitting: Pulmonary Disease

## 2013-03-18 MED ORDER — TIOTROPIUM BROMIDE MONOHYDRATE 18 MCG IN CAPS: 18.0000 ug | ORAL_CAPSULE | Freq: Every day | RESPIRATORY_TRACT | Status: DC

## 2013-03-18 NOTE — Telephone Encounter (Signed)
Refill sent and pt is aware. Susie Ehresman, CMA  

## 2013-03-21 DIAGNOSIS — J209 Acute bronchitis, unspecified: Secondary | ICD-10-CM | POA: Diagnosis not present

## 2013-04-05 DIAGNOSIS — J209 Acute bronchitis, unspecified: Secondary | ICD-10-CM | POA: Diagnosis not present

## 2013-05-20 ENCOUNTER — Telehealth: Payer: Self-pay | Admitting: Pulmonary Disease

## 2013-05-20 NOTE — Telephone Encounter (Signed)
Spoke with pt. She is scheduled to come in and see [place] in the AM AT 10. NOTHING FURTHER NEEDED

## 2013-05-21 ENCOUNTER — Ambulatory Visit (INDEPENDENT_AMBULATORY_CARE_PROVIDER_SITE_OTHER): Payer: Medicare Other | Admitting: Pulmonary Disease

## 2013-05-21 ENCOUNTER — Encounter: Payer: Self-pay | Admitting: Pulmonary Disease

## 2013-05-21 VITALS — BP 108/82 | HR 71 | Temp 98.5°F | Ht 63.0 in | Wt 208.8 lb

## 2013-05-21 DIAGNOSIS — J441 Chronic obstructive pulmonary disease with (acute) exacerbation: Secondary | ICD-10-CM

## 2013-05-21 DIAGNOSIS — J449 Chronic obstructive pulmonary disease, unspecified: Secondary | ICD-10-CM

## 2013-05-21 DIAGNOSIS — J9610 Chronic respiratory failure, unspecified whether with hypoxia or hypercapnia: Secondary | ICD-10-CM

## 2013-05-21 DIAGNOSIS — J4489 Other specified chronic obstructive pulmonary disease: Secondary | ICD-10-CM

## 2013-05-21 MED ORDER — DOXYCYCLINE HYCLATE 100 MG PO TABS: 100.0000 mg | ORAL_TABLET | Freq: Two times a day (BID) | ORAL | Status: DC

## 2013-05-21 MED ORDER — PREDNISONE 10 MG PO TABS: ORAL_TABLET | ORAL | Status: DC

## 2013-05-21 NOTE — Patient Instructions (Signed)
Will treat with an 8 day course of prednisone. Doxycycline 100mg  one am and pm for 5 days No change in your breathing medications.  You can use your nebulizer machine if having a hard time breathing.  followup with me in 60mos, but call if you are not improving.

## 2013-05-21 NOTE — Progress Notes (Signed)
   Subjective:    Patient ID: [name], female    DOB: 1960/12/04, 53 y.o.   MRN: [id number]  HPI The patient comes in today for an acute sick visit. She gives a one-week history of increasing shortness of breath, chest congestion, and cough with scant discolored mucus. She is also had increased wheezing as well. She has been staying on her medications and oxygen compliantly. She denies any fevers, chills, or sweats.   Review of Systems  Constitutional: Negative for fever and unexpected weight change.  HENT: Positive for congestion and rhinorrhea. Negative for dental problem, ear pain, nosebleeds, postnasal drip, sinus pressure, sneezing, sore throat and trouble swallowing.   Eyes: Negative for redness and itching.  Respiratory: Positive for cough, shortness of breath and wheezing. Negative for chest tightness.   Cardiovascular: Positive for chest pain ( burning in chest). Negative for palpitations and leg swelling.  Gastrointestinal: Negative for nausea and vomiting.  Genitourinary: Negative for dysuria.  Musculoskeletal: Negative for joint swelling.  Skin: Negative for rash.  Neurological: Positive for headaches.  Hematological: Does not bruise/bleed easily.  Psychiatric/Behavioral: Negative for dysphoric mood. The patient is not nervous/anxious.        Objective:   Physical Exam Obese female in no acute distress Nose without purulence or discharge noted Neck without lymphadenopathy or thyromegaly Chest with rhonchi and a few wheezes, decreased breath sounds diffusely Cardiac exam with regular rate and rhythm Lower extremities with mild ankle edema, no cyanosis Alert and oriented, moves all 4 extremities.       Assessment & Plan:

## 2013-05-21 NOTE — Assessment & Plan Note (Signed)
Patient's history is most consistent with an early COPD exacerbation. I've asked her to continue on her current medications, and also treat her with a course of prednisone and also antibiotics for possible acute bronchitis. She is to maintain on her usual pulmonary medications, and is to call us if she is not improving. I've also encouraged her to work aggressively on weight loss, so she can qualify for lung transplantation.

## 2013-06-07 ENCOUNTER — Ambulatory Visit: Payer: Medicare Other | Admitting: Pulmonary Disease

## 2013-06-18 DIAGNOSIS — J1189 Influenza due to unidentified influenza virus with other manifestations: Secondary | ICD-10-CM | POA: Diagnosis not present

## 2013-06-18 DIAGNOSIS — J069 Acute upper respiratory infection, unspecified: Secondary | ICD-10-CM | POA: Diagnosis not present

## 2013-07-05 DIAGNOSIS — J069 Acute upper respiratory infection, unspecified: Secondary | ICD-10-CM | POA: Diagnosis not present

## 2013-07-05 DIAGNOSIS — E119 Type 2 diabetes mellitus without complications: Secondary | ICD-10-CM | POA: Diagnosis not present

## 2013-07-05 DIAGNOSIS — J449 Chronic obstructive pulmonary disease, unspecified: Secondary | ICD-10-CM | POA: Diagnosis not present

## 2013-07-05 DIAGNOSIS — J45909 Unspecified asthma, uncomplicated: Secondary | ICD-10-CM | POA: Diagnosis not present

## 2013-07-22 DIAGNOSIS — Z116 Encounter for screening for other protozoal diseases and helminthiases: Secondary | ICD-10-CM | POA: Diagnosis not present

## 2013-07-22 DIAGNOSIS — Z113 Encounter for screening for infections with a predominantly sexual mode of transmission: Secondary | ICD-10-CM | POA: Diagnosis not present

## 2013-07-22 DIAGNOSIS — Z01419 Encounter for gynecological examination (general) (routine) without abnormal findings: Secondary | ICD-10-CM | POA: Diagnosis not present

## 2013-08-04 IMAGING — CT CT PARANASAL SINUSES LIMITED
3 series · 17 of 47 positions shown, 20 images · non-contrast
Comparison: Head CT 11/21/2008,chest radiograph 01/13/2011

CLINICAL DATA: Headaches, short of breath, left-sided nasal pain

CT PARANASAL SINUSES WITHOUT CONTRAST
TECHNIQUE: Multidetector CT through the paranasal sinuses was
performed using the standard protocol without intravenous contrast.

[Series 3: ltd sinuses 3.0 h40s · axial · 0.33mm/px · z∈[-212,-62]mm · 11 of 48 slices shown, 14 images]
[im 4/48  brain]
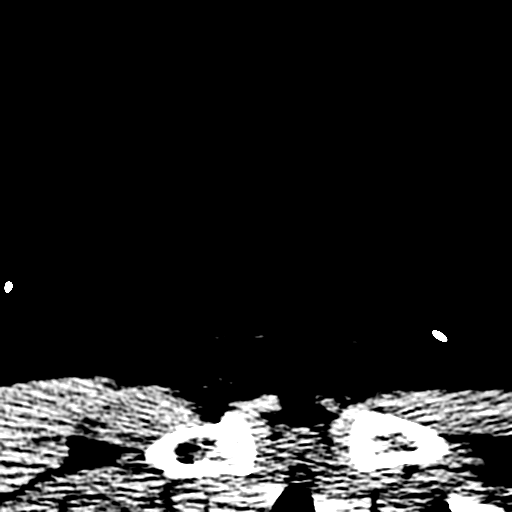
[im 4/48  bone]
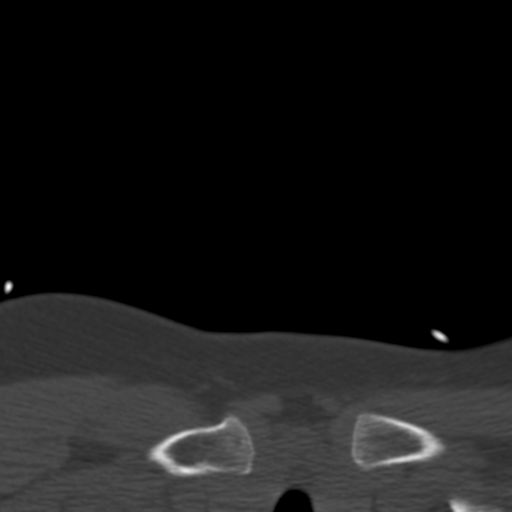
[im 7/48  bone]
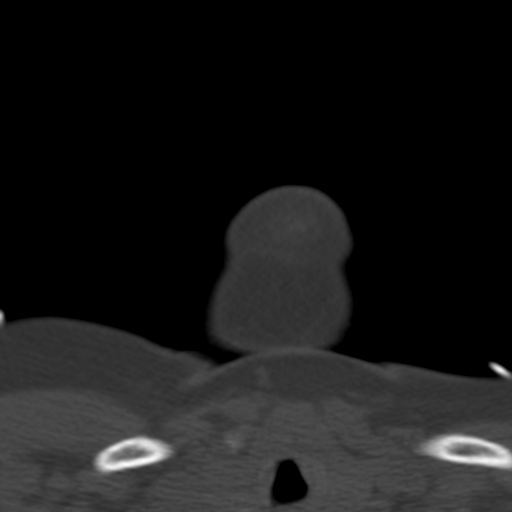
[im 12/48  bone]
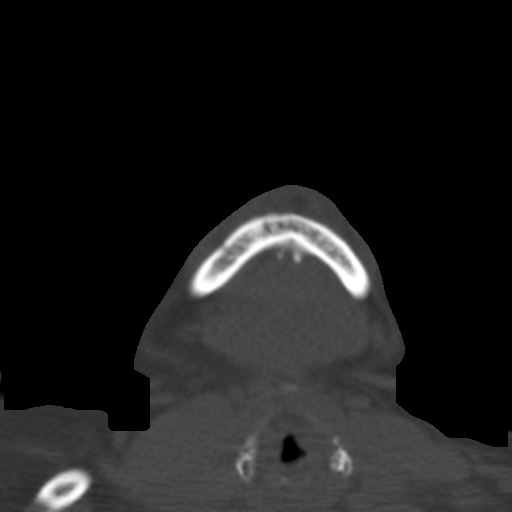
[im 15/48  bone]
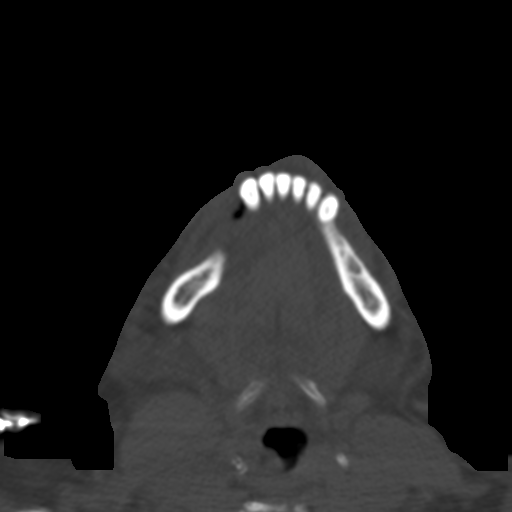
[im 20/48  brain]
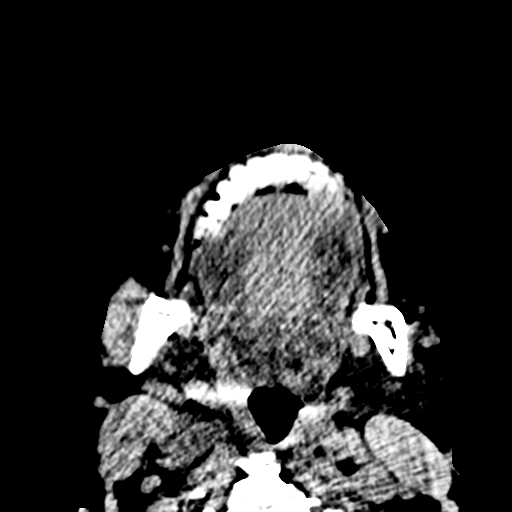
[im 20/48  bone]
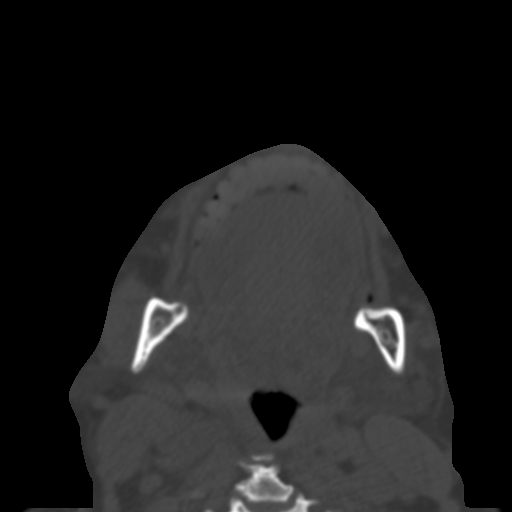
[im 25/48  bone]
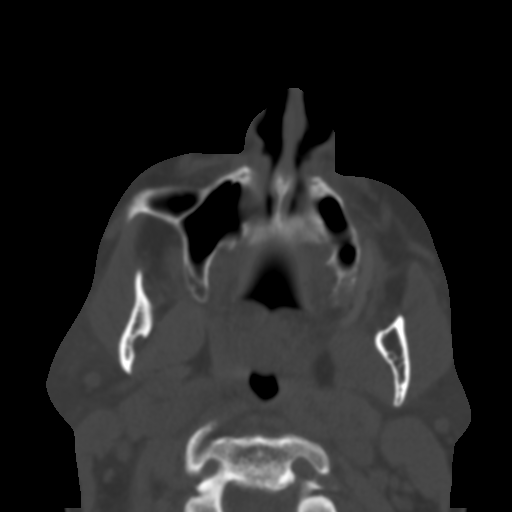
[im 28/48  bone]
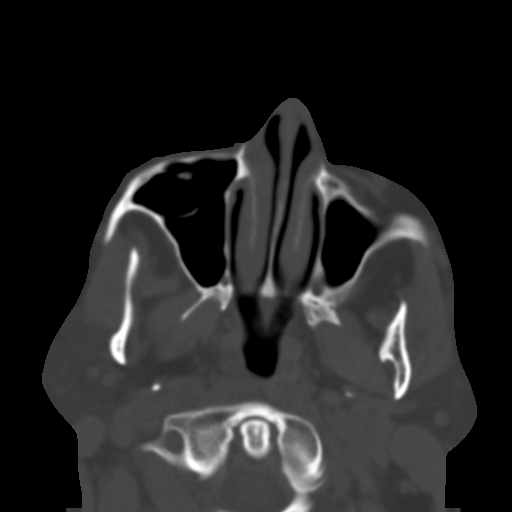
[im 33/48  bone]
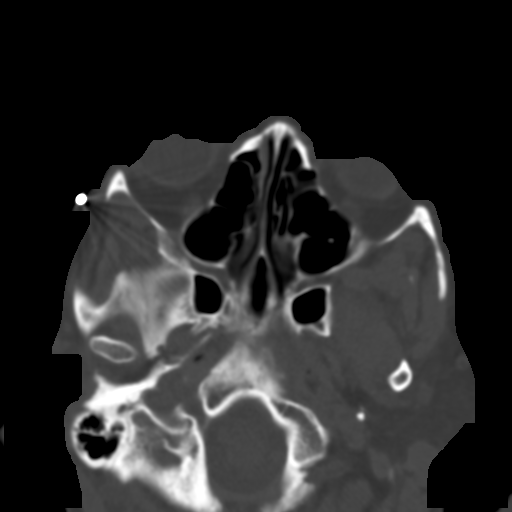
[im 36/48  brain]
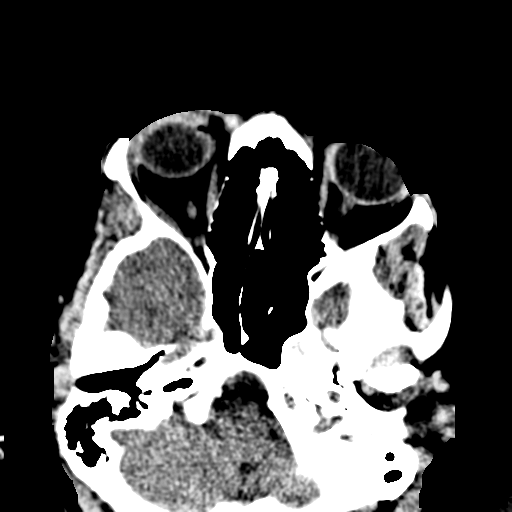
[im 36/48  bone]
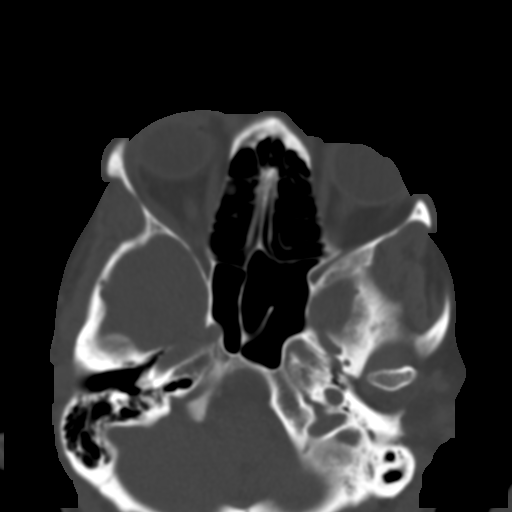
[im 41/48  bone]
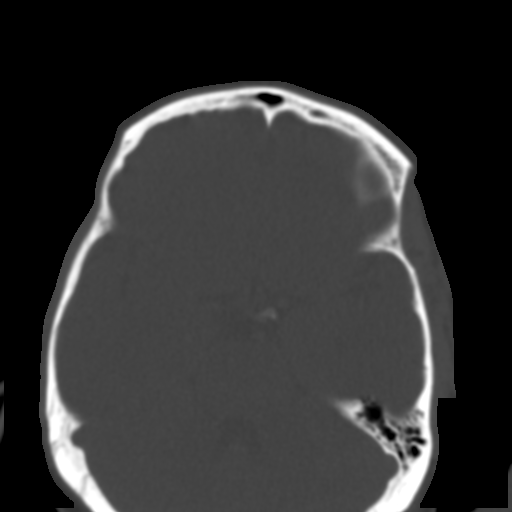
[im 44/48  bone]
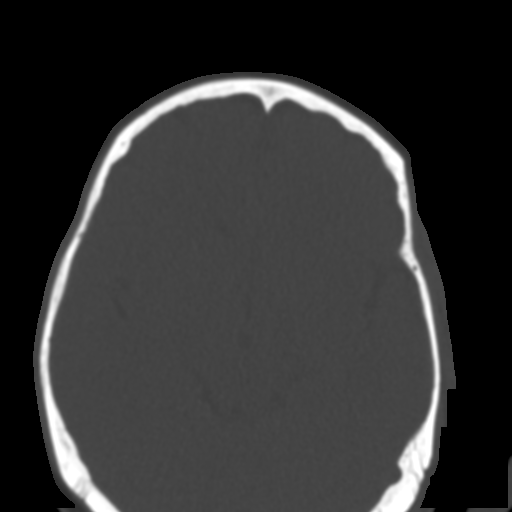

[Series 602: cor · coronal · 0.35mm/px · 3 of 117 slices shown]
[im 39/117  bone]
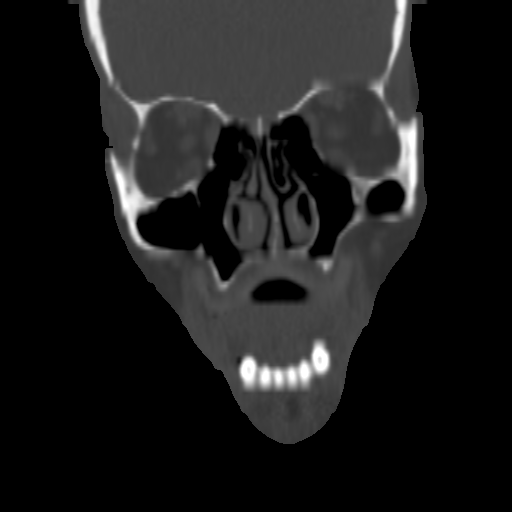
[im 52/117  bone]
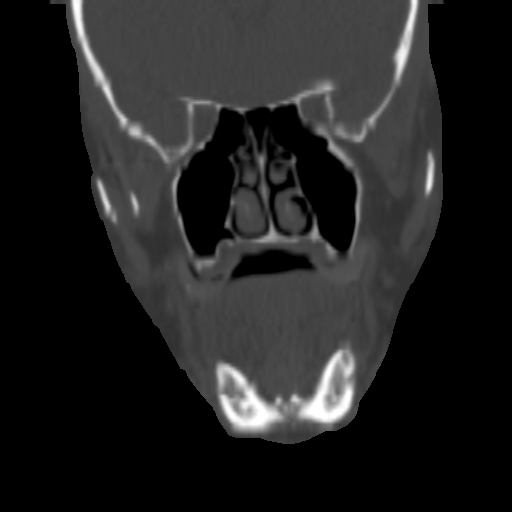
[im 65/117  bone]
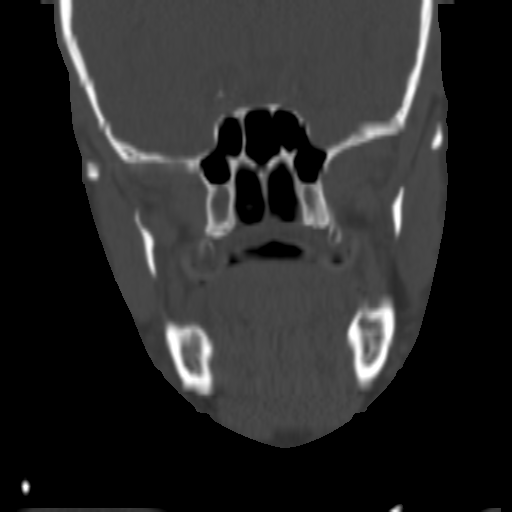

[Series 603: sag · sagittal · 0.35mm/px · 3 of 137 slices shown]
[im 46/137  bone]
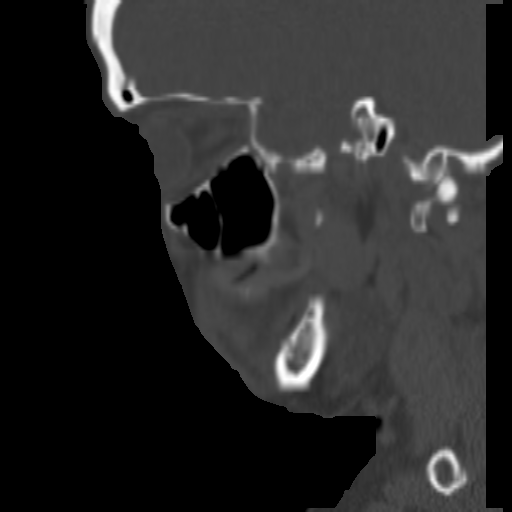
[im 69/137  bone]
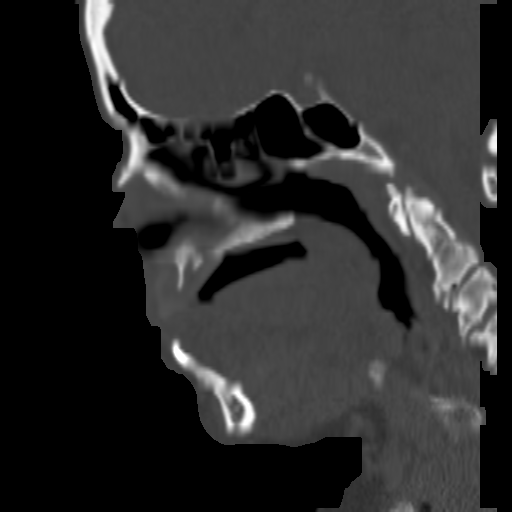
[im 91/137  bone]
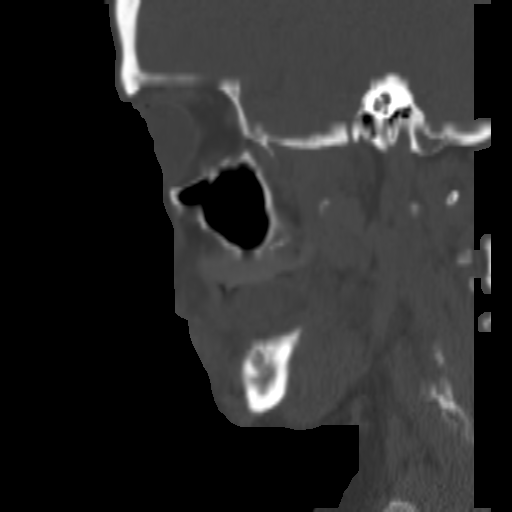

[17 of 47 positions shown; findings below may reference images not displayed]

FINDINGS: No osseous abnormality of the left or right orbit.  The
nasal bones are normal.  No fluid in the paranasal sinuses.  No
fluid in the frontal sinuses.  Mastoid air cells are clear.

The soft tissues demonstrate no abnormality of the orbits.  The
globes appear normal.  No evidence of abscess associated in nose
or face.
IMPRESSION: 1.  No osseous abnormality.

2.  No evidence of abscess within the soft tissues of the face.

## 2013-08-20 ENCOUNTER — Other Ambulatory Visit: Payer: Self-pay | Admitting: Pulmonary Disease

## 2013-08-20 MED ORDER — TIOTROPIUM BROMIDE MONOHYDRATE 18 MCG IN CAPS: 18.0000 ug | ORAL_CAPSULE | Freq: Every day | RESPIRATORY_TRACT | Status: DC

## 2013-09-06 ENCOUNTER — Other Ambulatory Visit: Payer: Self-pay | Admitting: Pulmonary Disease

## 2013-09-11 ENCOUNTER — Telehealth: Payer: Self-pay | Admitting: Pulmonary Disease

## 2013-09-11 NOTE — Telephone Encounter (Signed)
mandy from [employer] to fax new rx for albuterol for [facility] to sign so that it will fit medicare guidelines

## 2013-09-13 ENCOUNTER — Telehealth: Payer: Self-pay | Admitting: Pulmonary Disease

## 2013-09-13 NOTE — Telephone Encounter (Signed)
[place] medicare will no longer cover meds that have PRN in the sig.  Is it ok to change the albuterol neb to ready every 6 hours for wheezing/SOB?  thanks

## 2013-09-13 NOTE — Telephone Encounter (Signed)
I called and spoke with Diane--pharmacists and she is aware of [place] recs.  i advised her that the pt should not be using this medication every 6 hours and she stated that the pt does not use the albuterol every 6 hours.  She also stated that they would not send out more medications to the pt than she will need.  They fill this each month for the pt and they call to see how much she is using daily and send out the amount of meds that she she will need.  Nothing further is needed

## 2013-09-13 NOTE — Telephone Encounter (Signed)
Ok to change only under 2 circumstances: 1) the pt fully understands she is NOT to take every 6 hrs. 2) the DME does not send her crap loads of medication that she doesn't need.  Would order as one box only, with 6 fills.  Do not let them send 120 every month.

## 2013-11-16 ENCOUNTER — Other Ambulatory Visit: Payer: Self-pay | Admitting: Pulmonary Disease

## 2013-11-18 ENCOUNTER — Ambulatory Visit: Payer: Medicare Other | Admitting: Pulmonary Disease

## 2013-11-22 ENCOUNTER — Emergency Department (HOSPITAL_COMMUNITY)
Admission: EM | Admit: 2013-11-22 | Discharge: 2013-11-22 | Disposition: A | Discharge status: Home / Self Care | Payer: Medicare Other | Attending: Emergency Medicine | Admitting: Emergency Medicine

## 2013-11-22 ENCOUNTER — Encounter (HOSPITAL_COMMUNITY): Payer: Self-pay | Admitting: Emergency Medicine

## 2013-11-22 DIAGNOSIS — L089 Local infection of the skin and subcutaneous tissue, unspecified: Secondary | ICD-10-CM | POA: Diagnosis present

## 2013-11-22 DIAGNOSIS — Z8719 Personal history of other diseases of the digestive system: Secondary | ICD-10-CM | POA: Insufficient documentation

## 2013-11-22 DIAGNOSIS — J4489 Other specified chronic obstructive pulmonary disease: Secondary | ICD-10-CM | POA: Diagnosis not present

## 2013-11-22 DIAGNOSIS — Z87891 Personal history of nicotine dependence: Secondary | ICD-10-CM | POA: Diagnosis not present

## 2013-11-22 DIAGNOSIS — G8929 Other chronic pain: Secondary | ICD-10-CM | POA: Diagnosis not present

## 2013-11-22 DIAGNOSIS — IMO0002 Reserved for concepts with insufficient information to code with codable children: Secondary | ICD-10-CM | POA: Diagnosis not present

## 2013-11-22 DIAGNOSIS — J9610 Chronic respiratory failure, unspecified whether with hypoxia or hypercapnia: Secondary | ICD-10-CM | POA: Insufficient documentation

## 2013-11-22 DIAGNOSIS — L0291 Cutaneous abscess, unspecified: Secondary | ICD-10-CM

## 2013-11-22 DIAGNOSIS — L03319 Cellulitis of trunk, unspecified: Principal | ICD-10-CM

## 2013-11-22 DIAGNOSIS — L02219 Cutaneous abscess of trunk, unspecified: Secondary | ICD-10-CM | POA: Insufficient documentation

## 2013-11-22 DIAGNOSIS — J449 Chronic obstructive pulmonary disease, unspecified: Secondary | ICD-10-CM | POA: Insufficient documentation

## 2013-11-22 DIAGNOSIS — Z79899 Other long term (current) drug therapy: Secondary | ICD-10-CM | POA: Insufficient documentation

## 2013-11-22 DIAGNOSIS — Z9981 Dependence on supplemental oxygen: Secondary | ICD-10-CM | POA: Insufficient documentation

## 2013-11-22 MED ORDER — TRAMADOL HCL 50 MG PO TABS: 50.0000 mg | ORAL_TABLET | Freq: Four times a day (QID) | ORAL | Status: DC | PRN

## 2013-11-22 MED ORDER — SULFAMETHOXAZOLE-TRIMETHOPRIM 800-160 MG PO TABS: 2.0000 | ORAL_TABLET | Freq: Two times a day (BID) | ORAL | Status: DC

## 2013-11-22 MED ORDER — TRAMADOL HCL 50 MG PO TABS
50.0000 mg | ORAL_TABLET | Freq: Once | ORAL | Status: AC
  Administered 2013-11-22: 50 mg via ORAL
  Filled 2013-11-22: qty 1

## 2013-11-22 NOTE — ED Notes (Signed)
Pt c/o boil like on left side of abd for couple days. Pt states last night has some blood and pus drainage. Pt states that it is sore.

## 2013-11-22 NOTE — ED Provider Notes (Signed)
CSN: [id number]     Arrival date & time 11/22/13  0944 History   First MD Initiated Contact with Patient 11/22/13 1008     Chief Complaint  Patient presents with  . Recurrent Skin Infections     (Consider location/radiation/quality/duration/timing/severity/associated sxs/prior Treatment) HPI Comments: Patient presents with a chief complaint of an abscess to the left lower abdomen.  She reports that it has been present for the past 2-3 days and is gradually becoming larger and more tender.  She reports that earlier today it began draining a small amount of purulent fluid and blood.  She denies fever, chills, nausea, or vomiting.  She denies history of HIV or DM.  No treatment prior to arrival.  No prior history of abscesses.  The history is provided by the patient.    Past Medical History  Diagnosis Date  . Chronic headache   . GERD (gastroesophageal reflux disease)   . Chronic respiratory failure     O2 at home with exertion  . COPD (chronic obstructive pulmonary disease)     severe per chart  . Substance abuse   . Hiatal hernia    Past Surgical History  Procedure Laterality Date  . Hernia repair  02/2009   Family History  Problem Relation Age of Onset  . Heart disease Father    History  Substance Use Topics  . Smoking status: Former Smoker -- 1.00 packs/day for 20 years    Types: Cigarettes    Quit date: 01/18/2009  . Smokeless tobacco: Never Used  . Alcohol Use: Yes   OB History   Grav Para Term Preterm Abortions TAB SAB Ect Mult Living                 Review of Systems  All other systems reviewed and are negative.     Allergies  Ibuprofen and Tylenol  Home Medications   Prior to Admission medications   Medication Sig Start Date End Date Taking? Authorizing Provider  albuterol (PROVENTIL HFA;VENTOLIN HFA) 108 (90 BASE) MCG/ACT inhaler Inhale 2 puffs into the lungs 2 (two) times daily.   Yes Historical Provider, MD  albuterol (PROVENTIL) (2.5 MG/3ML)  0.083% nebulizer solution Take 2.5 mg by nebulization every 6 (six) hours as needed for wheezing or shortness of breath.   Yes Historical Provider, MD  budesonide-formoterol (SYMBICORT) 160-4.5 MCG/ACT inhaler Inhale 2 puffs into the lungs 2 (two) times daily.   Yes Historical Provider, MD  Ibuprofen (ADVIL) 200 MG CAPS Take 200 mg by mouth every 6 (six) hours as needed (for pain).   Yes Historical Provider, MD  Menthol-Methyl Salicylate (MUSCLE RUB) 10-15 % CREA Apply 1 application topically as needed for muscle pain.   Yes Historical Provider, MD  montelukast (SINGULAIR) 10 MG tablet Take 10 mg by mouth at bedtime.   Yes Historical Provider, MD  tiotropium (SPIRIVA) 18 MCG inhalation capsule Place 18 mcg into inhaler and inhale daily with breakfast.   Yes Historical Provider, MD   BP 115/76  Pulse 76  Temp(Src) 98.3 F (36.8 C) (Oral)  Resp 16  SpO2 94% Physical Exam  Nursing note and vitals reviewed. Constitutional: She appears well-developed and well-nourished.  HENT:  Head: Normocephalic and atraumatic.  Neck: Normal range of motion. Neck supple.  Cardiovascular: Normal rate, regular rhythm and normal heart sounds.   Pulmonary/Chest: Effort normal and breath sounds normal.  Abdominal: Soft.  No abdominal tenderness aside from the abscess  Neurological: She is alert.  Skin: Skin is warm and  dry.     Psychiatric: She has a normal mood and affect.    ED Course  Procedures (including critical care time) Labs Review Labs Reviewed - No data to display  Imaging Review No results found.   EKG Interpretation None     INCISION AND DRAINAGE Performed by: Hyman Bible Consent: Verbal consent obtained. Risks and benefits: risks, benefits and alternatives were discussed Type: abscess  Body area: left lower abdomen  Anesthesia: local infiltration  Incision was made with a scalpel.  Local anesthetic: lidocaine 2% with epinephrine  Anesthetic total: 2 ml  Complexity:  complex Blunt dissection to break up loculations  Drainage: purulent  Drainage amount: small   Patient tolerance: Patient tolerated the procedure well with no immediate complications.    MDM   Final diagnoses:  None   Patient presenting with an abscess of her left lower abdomen.  Patient is afebrile.  Abscess incised and drained in the ED.  Patient started on Bactrim DS.  Instructed to follow up in 2-3 days for recheck.  Patient stable for discharge.  Return precautions given.      Hyman Bible, PA-C 11/22/13 1149

## 2013-11-23 NOTE — ED Provider Notes (Signed)
Medical screening examination/treatment/procedure(s) were performed by non-physician practitioner and as supervising physician I was immediately available for consultation/collaboration.   EKG Interpretation None        Alfonzo Feller, DO 11/23/13 1410

## 2013-12-19 DIAGNOSIS — R21 Rash and other nonspecific skin eruption: Secondary | ICD-10-CM | POA: Diagnosis not present

## 2013-12-19 DIAGNOSIS — J449 Chronic obstructive pulmonary disease, unspecified: Secondary | ICD-10-CM | POA: Diagnosis not present

## 2013-12-19 DIAGNOSIS — E119 Type 2 diabetes mellitus without complications: Secondary | ICD-10-CM | POA: Diagnosis not present

## 2013-12-26 DIAGNOSIS — M899 Disorder of bone, unspecified: Secondary | ICD-10-CM | POA: Diagnosis not present

## 2014-01-07 DIAGNOSIS — E669 Obesity, unspecified: Secondary | ICD-10-CM | POA: Diagnosis not present

## 2014-01-07 DIAGNOSIS — K625 Hemorrhage of anus and rectum: Secondary | ICD-10-CM | POA: Diagnosis not present

## 2014-01-07 DIAGNOSIS — Z1211 Encounter for screening for malignant neoplasm of colon: Secondary | ICD-10-CM | POA: Diagnosis not present

## 2014-01-07 DIAGNOSIS — K5900 Constipation, unspecified: Secondary | ICD-10-CM | POA: Diagnosis not present

## 2014-01-27 DIAGNOSIS — R7309 Other abnormal glucose: Secondary | ICD-10-CM | POA: Diagnosis not present

## 2014-01-27 DIAGNOSIS — D649 Anemia, unspecified: Secondary | ICD-10-CM | POA: Diagnosis not present

## 2014-01-27 DIAGNOSIS — J449 Chronic obstructive pulmonary disease, unspecified: Secondary | ICD-10-CM | POA: Diagnosis not present

## 2014-02-04 DIAGNOSIS — Z1231 Encounter for screening mammogram for malignant neoplasm of breast: Secondary | ICD-10-CM | POA: Diagnosis not present

## 2014-02-10 ENCOUNTER — Other Ambulatory Visit: Payer: Self-pay | Admitting: Pulmonary Disease

## 2014-02-11 ENCOUNTER — Other Ambulatory Visit: Payer: Self-pay | Admitting: Gastroenterology

## 2014-02-18 DIAGNOSIS — Z0000 Encounter for general adult medical examination without abnormal findings: Secondary | ICD-10-CM | POA: Diagnosis not present

## 2014-02-18 DIAGNOSIS — Z23 Encounter for immunization: Secondary | ICD-10-CM | POA: Diagnosis not present

## 2014-02-21 ENCOUNTER — Encounter (HOSPITAL_COMMUNITY)
Admission: RE | Disposition: A | Discharge status: Home / Self Care | Payer: Self-pay | Source: Ambulatory Visit | Attending: Gastroenterology

## 2014-02-21 ENCOUNTER — Ambulatory Visit (HOSPITAL_COMMUNITY)
Admission: RE | Admit: 2014-02-21 | Discharge: 2014-02-21 | Disposition: A | Discharge status: Home / Self Care | Payer: Medicare Other | Source: Ambulatory Visit | Attending: Gastroenterology | Admitting: Gastroenterology

## 2014-02-21 ENCOUNTER — Encounter (HOSPITAL_COMMUNITY): Payer: Self-pay

## 2014-02-21 DIAGNOSIS — K5730 Diverticulosis of large intestine without perforation or abscess without bleeding: Secondary | ICD-10-CM | POA: Insufficient documentation

## 2014-02-21 DIAGNOSIS — Z886 Allergy status to analgesic agent status: Secondary | ICD-10-CM | POA: Diagnosis not present

## 2014-02-21 DIAGNOSIS — K635 Polyp of colon: Secondary | ICD-10-CM | POA: Insufficient documentation

## 2014-02-21 DIAGNOSIS — D123 Benign neoplasm of transverse colon: Secondary | ICD-10-CM | POA: Insufficient documentation

## 2014-02-21 DIAGNOSIS — K219 Gastro-esophageal reflux disease without esophagitis: Secondary | ICD-10-CM | POA: Diagnosis not present

## 2014-02-21 DIAGNOSIS — K625 Hemorrhage of anus and rectum: Secondary | ICD-10-CM | POA: Diagnosis not present

## 2014-02-21 DIAGNOSIS — J9610 Chronic respiratory failure, unspecified whether with hypoxia or hypercapnia: Secondary | ICD-10-CM | POA: Insufficient documentation

## 2014-02-21 DIAGNOSIS — Z87891 Personal history of nicotine dependence: Secondary | ICD-10-CM | POA: Diagnosis not present

## 2014-02-21 DIAGNOSIS — J449 Chronic obstructive pulmonary disease, unspecified: Secondary | ICD-10-CM | POA: Insufficient documentation

## 2014-02-21 DIAGNOSIS — Z9981 Dependence on supplemental oxygen: Secondary | ICD-10-CM | POA: Diagnosis not present

## 2014-02-21 DIAGNOSIS — D124 Benign neoplasm of descending colon: Secondary | ICD-10-CM | POA: Diagnosis not present

## 2014-02-21 DIAGNOSIS — K921 Melena: Secondary | ICD-10-CM | POA: Diagnosis present

## 2014-02-21 HISTORY — DX: Fracture of unspecified parts of lumbosacral spine and pelvis, initial encounter for closed fracture: S32.9XXA

## 2014-02-21 HISTORY — DX: Unspecified fracture of unspecified patella, initial encounter for closed fracture: S82.009A

## 2014-02-21 HISTORY — PX: COLONOSCOPY: SHX5424

## 2014-02-21 SURGERY — COLONOSCOPY
Anesthesia: Moderate Sedation

## 2014-02-21 MED ORDER — MIDAZOLAM HCL 10 MG/2ML IJ SOLN
INTRAMUSCULAR | Status: AC
  Filled 2014-02-21: qty 4

## 2014-02-21 MED ORDER — MIDAZOLAM HCL 5 MG/5ML IJ SOLN
INTRAMUSCULAR | Status: DC | PRN
  Administered 2014-02-21 (×2): 2 mg via INTRAVENOUS
  Administered 2014-02-21: 1 mg via INTRAVENOUS
  Administered 2014-02-21: 2 mg via INTRAVENOUS
  Administered 2014-02-21: 1 mg via INTRAVENOUS

## 2014-02-21 MED ORDER — FENTANYL CITRATE 0.05 MG/ML IJ SOLN
INTRAMUSCULAR | Status: DC | PRN
  Administered 2014-02-21 (×4): 25 ug via INTRAVENOUS

## 2014-02-21 MED ORDER — SODIUM CHLORIDE 0.9 % IV SOLN
INTRAVENOUS | Status: DC
  Administered 2014-02-21: 500 mL via INTRAVENOUS

## 2014-02-21 MED ORDER — DIPHENHYDRAMINE HCL 50 MG/ML IJ SOLN
INTRAMUSCULAR | Status: AC
  Filled 2014-02-21: qty 1

## 2014-02-21 MED ORDER — FENTANYL CITRATE 0.05 MG/ML IJ SOLN
INTRAMUSCULAR | Status: AC
  Filled 2014-02-21: qty 4
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
## 2014-02-21 NOTE — Discharge Instructions (Signed)

## 2014-02-21 NOTE — Op Note (Signed)
[facility] [place], [address]   COLONOSCOPY PROCEDURE REPORT  PATIENT: [name], [name]  MR#: [id number] BIRTHDATE: [birth year] , 47  yrs. old GENDER: female ENDOSCOPIST: Carol Ada, MD REFERRED BY: PROCEDURE DATE:  03/23/2014 PROCEDURE:   Colonoscopy with snare polypectomy ASA CLASS:   Class III INDICATIONS:hematochezia. MEDICATIONS: Fentanyl 100 mcg IV and Versed 8 mg IV  DESCRIPTION OF PROCEDURE:   After the risks and benefits and of the procedure were explained, informed consent was obtained.  revealed no abnormalities of the rectum.    The [id number] ([id number]) endoscope was introduced through the anus and advanced to the terminal ileum which was intubated for a short distance .  The quality of the prep was excellent. .  The instrument was then slowly withdrawn as the colon was fully examined.  FINDINGS: A 3 mm sessile transverse colon polyp was removed with a cold snare.  A 3 mm  pedunculated polyp was removed with a hot snare.  These polyps were removed during the insertion of the colonoscope.  Several hepatic flexure diverticula were identified, but the majority of the diverticula were in the sigmoid colon. Retroflexed views revealed no abnormalities.     The scope was then withdrawn from the patient and the procedure completed. WITHDRAWAL TIME: 8 minutes.  COMPLICATIONS: There were no immediate complications. ENDOSCOPIC IMPRESSION: 1) Polyps. 2) Diverticula.  RECOMMENDATIONS: 1) Await biopsy results and repeat the colonoscopy in 5 years. REPEAT EXAM: cc: _______________________________ eSignedCarol Ada, MD 03/23/14 11:56 AM   CPT CODES: ICD CODES:  The ICD and CPT codes recommended by this software are interpretations from the data that the clinical staff has captured with the software.  The verification of the translation of this report to the ICD and CPT codes and modifiers is the sole responsibility of the health  care institution and practicing physician where this report was generated.  [facility]. will not be held responsible for the validity of the ICD and CPT codes included on this report.  AMA assumes no liability for data contained or not contained herein. CPT is a Designer, television/film set of the [employer].

## 2014-02-24 ENCOUNTER — Encounter (HOSPITAL_COMMUNITY): Payer: Self-pay | Admitting: Gastroenterology

## 2014-04-18 ENCOUNTER — Other Ambulatory Visit: Payer: Self-pay | Admitting: Pulmonary Disease

## 2014-05-02 DIAGNOSIS — H4323 Crystalline deposits in vitreous body, bilateral: Secondary | ICD-10-CM | POA: Diagnosis not present

## 2014-05-27 DIAGNOSIS — J449 Chronic obstructive pulmonary disease, unspecified: Secondary | ICD-10-CM | POA: Diagnosis not present

## 2014-06-13 ENCOUNTER — Other Ambulatory Visit: Payer: Self-pay | Admitting: Pulmonary Disease

## 2014-06-24 ENCOUNTER — Telehealth: Payer: Self-pay | Admitting: Pulmonary Disease

## 2014-06-24 NOTE — Telephone Encounter (Signed)
Called spoke with pt. She reports spiriva is no longer covered by insurance. I advised her she needs to contact her insurance to see what is covered. She reports she was told to call us bc only we know what is an alternative to spiriva. Please advise [place] thanks

## 2014-06-25 NOTE — Telephone Encounter (Signed)
Can't prescribe anything unless I know what is covered.  She can call her insurance company or check her book to see if Incruse is covered.

## 2014-06-25 NOTE — Telephone Encounter (Signed)
ATC - Unable to leave message - Will try back

## 2014-06-25 NOTE — Telephone Encounter (Signed)
Pt called back wanting to speak to nurse, told her to check w/insurance company to see which meds were covered she is going to call ins. To see if thy cover incruse and then give [place] a call back.[name]

## 2014-07-02 NOTE — Telephone Encounter (Signed)
Called Medicare. Was on hold for 41min.  When they answered the phone, they said that I would have to call the provider line and gave me two different numbers to call: [phone] and [phone]; I called both numbers and these numbers were not for drug coverage, they were for hospital supplies and appeals.  Called [facility], they pulled up patient's insurance and found that her insurance will cover Spiriva Respimat.. It will not cover Spiriva Handihaler, but will cover Respimat.

## 2014-07-02 NOTE — Telephone Encounter (Signed)
So if spiriva respimat is covered, ok to get her on this.  Will need to come by for instruction on the device.

## 2014-07-02 NOTE — Telephone Encounter (Signed)
Called and spoke to pt. Pt stated she has tried several times to reach her insurance without success to see what other covered alternatives are to Spiriva. Pt stated the medicare phone number is: [phone].  Will forward to [place] as she is doing PA's this afternoon.

## 2014-07-03 MED ORDER — TIOTROPIUM BROMIDE MONOHYDRATE 2.5 MCG/ACT IN AERS: 2.0000 | INHALATION_SPRAY | Freq: Every day | RESPIRATORY_TRACT | Status: DC

## 2014-07-03 NOTE — Telephone Encounter (Signed)
Pt is aware of the medication change. This has been sent to her pharmacy to determine coverage. If it is covered she will pick it up and come by here so that we may show her how to use it.

## 2014-07-16 DIAGNOSIS — J449 Chronic obstructive pulmonary disease, unspecified: Secondary | ICD-10-CM | POA: Diagnosis not present

## 2014-07-22 ENCOUNTER — Telehealth: Payer: Self-pay | Admitting: Pulmonary Disease

## 2014-07-22 NOTE — Telephone Encounter (Signed)
Called and spoke with pt and she stated that she received a letter from [employer] that stated that in order for her to continue to get her proair we have to do a PA.  i [facility] pharmacy [facility] and they stated that no PA is required at this time, but she cannot refill until 3/13.  [place] please advise.  Thanks  Allergies  Allergen Reactions  . Ibuprofen Shortness Of Breath       . Tylenol [Acetaminophen] Shortness Of Breath    Current Outpatient Prescriptions on File Prior to Visit  Medication Sig Dispense Refill  . albuterol (PROVENTIL HFA;VENTOLIN HFA) 108 (90 BASE) MCG/ACT inhaler Inhale 2 puffs into the lungs 2 (two) times daily.    Marland Kitchen albuterol (PROVENTIL) (2.5 MG/3ML) 0.083% nebulizer solution Take 2.5 mg by nebulization every 6 (six) hours as needed for wheezing or shortness of breath.    . budesonide-formoterol (SYMBICORT) 160-4.5 MCG/ACT inhaler Inhale 2 puffs into the lungs 2 (two) times daily.    . Ibuprofen (ADVIL) 200 MG CAPS Take 200 mg by mouth every 6 (six) hours as needed (for pain).    . Menthol-Methyl Salicylate (MUSCLE RUB) 10-15 % CREA Apply 1 application topically as needed for muscle pain.    . montelukast (SINGULAIR) 10 MG tablet Take 10 mg by mouth at bedtime.    Marland Kitchen PROAIR HFA 108 (90 BASE) MCG/ACT inhaler INHALE 2 PUFFS INTO THE LUNGS EVERY 6 (SIX) HOURS AS NEEDED FOR WHEEZING 25.5 each 1  . SYMBICORT 160-4.5 MCG/ACT inhaler INHALE 2 PUFFS INTO THE LUNGS TWICE DAILY AS DIRECTED 10.2 Inhaler 6  . Tiotropium Bromide Monohydrate (SPIRIVA RESPIMAT) 2.5 MCG/ACT AERS Inhale 2 puffs into the lungs daily. 4 g 5  . traMADol (ULTRAM) 50 MG tablet Take 1 tablet (50 mg total) by mouth every 6 (six) hours as needed. 15 tablet 0   No current facility-administered medications on file prior to visit.

## 2014-07-23 NOTE — Telephone Encounter (Signed)
dont know what to do about that.  ?try to call in to a different pharmacy. O/w, she may have to pay out of pocket if insurance won't cover until 3/13

## 2014-07-24 NOTE — Telephone Encounter (Signed)
Pt dropped off form for ProAir. Gave to form to [place].

## 2014-07-24 NOTE — Telephone Encounter (Signed)
Do not do PA Send in proventil or ventolin

## 2014-07-24 NOTE — Telephone Encounter (Signed)
Pt dropped off letter from [employer] stating proair is not on formulary. It does not state if ventolin/proventil is on formulary.  It does state we can do a PA for this to see if we can get this approved. call [phone] [facility] is it okay to call in ventolin/proventil or do PA? thanks

## 2014-07-24 NOTE — Telephone Encounter (Signed)
Pt states that she is not out of Proair at this time.  She will bring letter by and drop it off for [place] to advise on.  Will forward to [place] to await letter.

## 2014-07-25 NOTE — Telephone Encounter (Signed)
Pt aware that rx is ready at pharmacy. Nothing further needed.

## 2014-07-25 NOTE — Telephone Encounter (Signed)
[phone], pt cb

## 2014-07-25 NOTE — Telephone Encounter (Signed)
[facility] - [facility]olin is covered. This has been filled for patient.  Called pt to make aware, unable to leave voicemail - mailbox not set up. WCB

## 2014-08-21 DIAGNOSIS — J449 Chronic obstructive pulmonary disease, unspecified: Secondary | ICD-10-CM | POA: Diagnosis not present

## 2014-08-21 DIAGNOSIS — B49 Unspecified mycosis: Secondary | ICD-10-CM | POA: Diagnosis not present

## 2014-08-21 DIAGNOSIS — E669 Obesity, unspecified: Secondary | ICD-10-CM | POA: Diagnosis not present

## 2014-08-21 DIAGNOSIS — Z720 Tobacco use: Secondary | ICD-10-CM | POA: Diagnosis not present

## 2014-08-21 DIAGNOSIS — Z750 Medical services not available in home: Secondary | ICD-10-CM | POA: Diagnosis not present

## 2014-08-26 ENCOUNTER — Telehealth: Payer: Self-pay | Admitting: Pulmonary Disease

## 2014-08-26 NOTE — Telephone Encounter (Signed)
Spoke with pt. States that [facility] advised her that Spiriva Respimat is no longer a formulary medication.  Spoke with Meriel Pica at [facility]. She is going fax the denial to Triage. Will hold in triage until received.

## 2014-08-27 DIAGNOSIS — R0600 Dyspnea, unspecified: Secondary | ICD-10-CM | POA: Diagnosis not present

## 2014-08-27 NOTE — Telephone Encounter (Signed)
This has been placed in Kyle green folder.

## 2014-08-27 NOTE — Telephone Encounter (Signed)
ATC number listed below to check coverage of alternatives Transferred multiple times and then placed on a long hold.  Phone number to call back in AM is [phone] Patient ID [id number]

## 2014-08-27 NOTE — Telephone Encounter (Signed)
Received fax from [place] [phone] and requested formulary exception form  Will await fax

## 2014-08-27 NOTE — Telephone Encounter (Signed)
We need to find out if they will cover tudorza or incruse before filling out form.

## 2014-08-27 NOTE — Telephone Encounter (Signed)
Formulary exemption form given to Mindy to have [place] complete  Will forward to her to f/u on, thanks!

## 2014-08-28 MED ORDER — UMECLIDINIUM BROMIDE 62.5 MCG/INH IN AEPB: 1.0000 | INHALATION_SPRAY | Freq: Every day | RESPIRATORY_TRACT | Status: DC

## 2014-08-28 NOTE — Telephone Encounter (Signed)
Ok to change to incruse one inhalation each am in the place of spiriva

## 2014-08-28 NOTE — Telephone Encounter (Signed)
Called patient insurance and spoke with Otila Kluver  Requesting alternatives for Spiriva Respimat Patient ID: [id number] Keenan Bachelor is a covered alternative- co-pay will be around $7.40  Please advise Dr Gwenette Greet. Thanks.

## 2014-08-28 NOTE — Telephone Encounter (Signed)
Called pt and is aware of recs. RX sent in. Nothing further needed 

## 2014-09-08 ENCOUNTER — Telehealth: Payer: Self-pay | Admitting: Pulmonary Disease

## 2014-09-08 NOTE — Telephone Encounter (Signed)
lmtcb x1 

## 2014-09-09 MED ORDER — ALBUTEROL SULFATE HFA 108 (90 BASE) MCG/ACT IN AERS: 2.0000 | INHALATION_SPRAY | Freq: Four times a day (QID) | RESPIRATORY_TRACT | Status: DC | PRN

## 2014-09-09 NOTE — Telephone Encounter (Signed)
Called and spoke to pt. Pt requesting an albuterol inhaler other than proair be sent to pharmacy. Proventil sent to preferred pharmacy. Appt made with [facility] on 5/13 as pt hasnt been seen since 05/2013. Pt verbalized understanding and denied any further questions or concerns at this time.

## 2014-09-12 DIAGNOSIS — E785 Hyperlipidemia, unspecified: Secondary | ICD-10-CM | POA: Diagnosis not present

## 2014-09-12 DIAGNOSIS — J449 Chronic obstructive pulmonary disease, unspecified: Secondary | ICD-10-CM | POA: Diagnosis not present

## 2014-09-12 DIAGNOSIS — Z720 Tobacco use: Secondary | ICD-10-CM | POA: Diagnosis not present

## 2014-09-12 DIAGNOSIS — E669 Obesity, unspecified: Secondary | ICD-10-CM | POA: Diagnosis not present

## 2014-09-12 DIAGNOSIS — E559 Vitamin D deficiency, unspecified: Secondary | ICD-10-CM | POA: Diagnosis not present

## 2014-09-12 DIAGNOSIS — R7309 Other abnormal glucose: Secondary | ICD-10-CM | POA: Diagnosis not present

## 2014-09-12 DIAGNOSIS — Z2252 Carrier of viral hepatitis C: Secondary | ICD-10-CM | POA: Diagnosis not present

## 2014-09-26 ENCOUNTER — Encounter: Payer: Self-pay | Admitting: Pulmonary Disease

## 2014-09-26 ENCOUNTER — Ambulatory Visit (INDEPENDENT_AMBULATORY_CARE_PROVIDER_SITE_OTHER): Payer: Medicare Other | Admitting: Pulmonary Disease

## 2014-09-26 VITALS — BP 120/68 | HR 67 | Temp 98.5°F | Ht 63.0 in | Wt 186.8 lb

## 2014-09-26 DIAGNOSIS — J438 Other emphysema: Secondary | ICD-10-CM

## 2014-09-26 DIAGNOSIS — J9611 Chronic respiratory failure with hypoxia: Secondary | ICD-10-CM | POA: Diagnosis not present

## 2014-09-26 NOTE — Patient Instructions (Signed)
Try taking an over the counter antihistamine for your nasal drainage.   Stay on your inhalers, and keep working on weight loss.  followup in 7mos with Dr. Lake Bells.

## 2014-09-26 NOTE — Progress Notes (Signed)
   Subjective:    Patient ID: [name], female    DOB: July 05, 1960, 54 y.o.   MRN: [id number]  HPI The patient comes in today for follow-up of her known COPD. She has been stable since the last visit, and actually feels that her breathing is much better since losing 22 pounds. Commended her on this.  She is staying on her bronchodilator regimen, and has had no worsening chest congestion or purulence. She does have a lot of postnasal drip because of allergic rhinitis.   Review of Systems  Constitutional: Negative for fever and unexpected weight change.  HENT: Negative for congestion, dental problem, ear pain, nosebleeds, postnasal drip, rhinorrhea, sinus pressure, sneezing, sore throat and trouble swallowing.   Eyes: Negative for redness and itching.  Respiratory: Positive for shortness of breath. Negative for cough, chest tightness and wheezing.   Cardiovascular: Negative for palpitations and leg swelling.  Gastrointestinal: Negative for nausea and vomiting.  Genitourinary: Negative for dysuria.  Musculoskeletal: Negative for joint swelling.  Skin: Negative for rash.  Neurological: Negative for headaches.  Hematological: Does not bruise/bleed easily.  Psychiatric/Behavioral: Negative for dysphoric mood. The patient is not nervous/anxious.        Objective:   Physical Exam Overweight female in no acute distress Nose without purulence or discharge noted Neck without lymphadenopathy or thyromegaly Chest with mildly decreased breath sounds, no wheezing Cardiac exam with regular rate and rhythm Lower extremities with no significant edema, no cyanosis Alert and oriented, moves all 4 extremities.       Assessment & Plan:

## 2014-09-26 NOTE — Assessment & Plan Note (Signed)
The patient feels that she is actually doing better compared to the last visit, and feels her 22 pound weight loss has contributed the most to this. I totally agree. I have asked her to continue on her current inhaler regimen, and to keep working on her weight loss and conditioning. She will follow-up with [place] again in 6 months.

## 2014-09-29 ENCOUNTER — Other Ambulatory Visit: Payer: Medicare Other

## 2014-09-29 DIAGNOSIS — B182 Chronic viral hepatitis C: Secondary | ICD-10-CM | POA: Diagnosis not present

## 2014-09-29 LAB — PROTIME-INR
INR: 0.99 (ref <1.50)
Prothrombin Time: 13.1 seconds (ref 11.6–15.2)

## 2014-09-30 LAB — ANA: ANA: NEGATIVE

## 2014-10-01 ENCOUNTER — Other Ambulatory Visit: Payer: Self-pay | Admitting: Internal Medicine

## 2014-10-01 DIAGNOSIS — B182 Chronic viral hepatitis C: Secondary | ICD-10-CM

## 2014-10-02 LAB — HEPATITIS C RNA QUANTITATIVE: HCV Quantitative Log: <1.18 {Log} (ref <1.18)

## 2014-10-03 LAB — HEPATITIS C GENOTYPE

## 2014-10-06 ENCOUNTER — Telehealth: Payer: Self-pay | Admitting: *Deleted

## 2014-10-06 NOTE — Telephone Encounter (Signed)
-----   Message from Thayer Headings, MD sent at 10/06/2014  1:24 PM EDT ----- Her hcv viral load is negative so does not have active chronic hepatitis C and so does not need an appt.  thanks

## 2014-10-06 NOTE — Telephone Encounter (Signed)
Patient notified Jacqueline Cockerham  

## 2014-10-27 ENCOUNTER — Ambulatory Visit (INDEPENDENT_AMBULATORY_CARE_PROVIDER_SITE_OTHER): Payer: Medicare Other | Admitting: Internal Medicine

## 2014-10-27 ENCOUNTER — Encounter: Payer: Self-pay | Admitting: Internal Medicine

## 2014-10-27 VITALS — BP 135/84 | HR 71 | Temp 98.2°F | Ht 63.0 in | Wt 183.0 lb

## 2014-10-27 DIAGNOSIS — R768 Other specified abnormal immunological findings in serum: Secondary | ICD-10-CM | POA: Insufficient documentation

## 2014-10-27 DIAGNOSIS — R894 Abnormal immunological findings in specimens from other organs, systems and tissues: Secondary | ICD-10-CM

## 2014-10-27 HISTORY — DX: Other specified abnormal immunological findings in serum: R76.8
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
## 2014-10-31 ENCOUNTER — Telehealth: Payer: Self-pay | Admitting: Pulmonary Disease

## 2014-10-31 MED ORDER — ALBUTEROL SULFATE (2.5 MG/3ML) 0.083% IN NEBU: 2.5000 mg | INHALATION_SOLUTION | Freq: Four times a day (QID) | RESPIRATORY_TRACT | Status: DC | PRN

## 2014-10-31 NOTE — Telephone Encounter (Signed)
That's fine

## 2014-10-31 NOTE — Telephone Encounter (Signed)
Mowi called from [facility] to confirm refill request on Albuterol.  Needed order to say "Q6 hour AND as needed instead of just as needed" so that insurance would pay for the medication.   Gave verbal order. Nothing further needed.

## 2014-10-31 NOTE — Telephone Encounter (Signed)
Tried to call pt but no answer and VM not set up.    Pt requesting refill on Albuterol neb solution. [place] pt seen in May. Due for f/u in 6 months w/BQ. May we refill Albuterol neb solution? MW please advise.

## 2014-10-31 NOTE — Telephone Encounter (Signed)
Patient saw [place] on 09/26/14, not due to see BQ until 03/29/15.  Patient requesting refill on Neb Solution.  Sent Rx to [facility] for refill. Patient notified. Nothing further needed.

## 2014-10-31 NOTE — Telephone Encounter (Signed)
ATC pt - voicemail not set up. wcb

## 2014-11-05 ENCOUNTER — Telehealth: Payer: Self-pay | Admitting: Pulmonary Disease

## 2014-11-05 MED ORDER — ALBUTEROL SULFATE HFA 108 (90 BASE) MCG/ACT IN AERS: 2.0000 | INHALATION_SPRAY | Freq: Four times a day (QID) | RESPIRATORY_TRACT | Status: DC | PRN

## 2014-11-05 NOTE — Telephone Encounter (Signed)
Spoke with pt, needs rescue inhaler refill.  This has been sent to preferred pharmacy.  Nothing further needed.

## 2014-12-03 ENCOUNTER — Telehealth: Payer: Self-pay | Admitting: Pulmonary Disease

## 2014-12-03 NOTE — Telephone Encounter (Signed)
lmtcb x1 w/ family member  

## 2014-12-04 MED ORDER — MONTELUKAST SODIUM 10 MG PO TABS: 10.0000 mg | ORAL_TABLET | Freq: Every day | ORAL | Status: DC

## 2014-12-04 MED ORDER — UMECLIDINIUM BROMIDE 62.5 MCG/INH IN AEPB: 1.0000 | INHALATION_SPRAY | Freq: Every day | RESPIRATORY_TRACT | Status: DC

## 2014-12-04 NOTE — Telephone Encounter (Signed)
Pt [facility] [address]  [place] pt, is to follow up with BQ, will sign these rx's under him.  Nothing further needed.

## 2015-01-08 ENCOUNTER — Telehealth: Payer: Self-pay | Admitting: Internal Medicine

## 2015-01-08 MED ORDER — BUDESONIDE-FORMOTEROL FUMARATE 160-4.5 MCG/ACT IN AERO
2.0000 | INHALATION_SPRAY | Freq: Two times a day (BID) | RESPIRATORY_TRACT | Status: DC
Start: 2015-01-08 — End: 2016-07-26

## 2015-01-08 NOTE — Telephone Encounter (Signed)
Called and spoke to pt. Pt requesting a Symbicort refill. Rx sent to preferred pharmacy. Pt verbalized understanding and denied any further questions or concerns at this time.

## 2015-03-01 ENCOUNTER — Encounter (HOSPITAL_COMMUNITY): Payer: Self-pay | Admitting: Emergency Medicine

## 2015-03-01 ENCOUNTER — Emergency Department (HOSPITAL_COMMUNITY)
Admission: EM | Admit: 2015-03-01 | Discharge: 2015-03-01 | Disposition: A | Discharge status: Home / Self Care | Payer: Commercial Managed Care - HMO | Attending: Emergency Medicine | Admitting: Emergency Medicine

## 2015-03-01 DIAGNOSIS — R300 Dysuria: Secondary | ICD-10-CM | POA: Diagnosis present

## 2015-03-01 DIAGNOSIS — Z87891 Personal history of nicotine dependence: Secondary | ICD-10-CM | POA: Insufficient documentation

## 2015-03-01 DIAGNOSIS — J449 Chronic obstructive pulmonary disease, unspecified: Secondary | ICD-10-CM | POA: Insufficient documentation

## 2015-03-01 DIAGNOSIS — Z9981 Dependence on supplemental oxygen: Secondary | ICD-10-CM | POA: Insufficient documentation

## 2015-03-01 DIAGNOSIS — Z792 Long term (current) use of antibiotics: Secondary | ICD-10-CM | POA: Insufficient documentation

## 2015-03-01 DIAGNOSIS — N898 Other specified noninflammatory disorders of vagina: Secondary | ICD-10-CM | POA: Diagnosis not present

## 2015-03-01 DIAGNOSIS — G8929 Other chronic pain: Secondary | ICD-10-CM | POA: Insufficient documentation

## 2015-03-01 DIAGNOSIS — Z79899 Other long term (current) drug therapy: Secondary | ICD-10-CM | POA: Diagnosis not present

## 2015-03-01 DIAGNOSIS — N390 Urinary tract infection, site not specified: Secondary | ICD-10-CM | POA: Diagnosis not present

## 2015-03-01 DIAGNOSIS — Z8719 Personal history of other diseases of the digestive system: Secondary | ICD-10-CM | POA: Diagnosis not present

## 2015-03-01 DIAGNOSIS — Z202 Contact with and (suspected) exposure to infections with a predominantly sexual mode of transmission: Secondary | ICD-10-CM | POA: Diagnosis not present

## 2015-03-01 DIAGNOSIS — Z8781 Personal history of (healed) traumatic fracture: Secondary | ICD-10-CM | POA: Insufficient documentation

## 2015-03-01 DIAGNOSIS — J9610 Chronic respiratory failure, unspecified whether with hypoxia or hypercapnia: Secondary | ICD-10-CM | POA: Diagnosis not present

## 2015-03-01 LAB — WET PREP, GENITAL
Trich, Wet Prep: NONE SEEN
Yeast Wet Prep HPF POC: NONE SEEN

## 2015-03-01 LAB — HCG, QUANTITATIVE, PREGNANCY: hCG, Beta Chain, Quant, S: 1 m[IU]/mL (ref <5)

## 2015-03-01 LAB — URINALYSIS, ROUTINE W REFLEX MICROSCOPIC
Bilirubin Urine: NEGATIVE
GLUCOSE, UA: NEGATIVE mg/dL
KETONES UR: NEGATIVE mg/dL
Nitrite: NEGATIVE
PROTEIN: NEGATIVE mg/dL
Specific Gravity, Urine: 1.016 (ref 1.005–1.030)
Urobilinogen, UA: 0.2 mg/dL (ref 0.0–1.0)
pH: 5 (ref 5.0–8.0)

## 2015-03-01 LAB — URINE MICROSCOPIC-ADD ON

## 2015-03-01 MED ORDER — LIDOCAINE HCL (PF) 1 % IJ SOLN
INTRAMUSCULAR | Status: AC
  Administered 2015-03-01: 1 mL
  Filled 2015-03-01: qty 5

## 2015-03-01 MED ORDER — CEPHALEXIN 500 MG PO CAPS: 500.0000 mg | ORAL_CAPSULE | Freq: Two times a day (BID) | ORAL | Status: DC

## 2015-03-01 MED ORDER — CEFTRIAXONE SODIUM 250 MG IJ SOLR
250.0000 mg | Freq: Once | INTRAMUSCULAR | Status: AC
  Administered 2015-03-01: 250 mg via INTRAMUSCULAR
  Filled 2015-03-01: qty 250

## 2015-03-01 MED ORDER — AZITHROMYCIN 250 MG PO TABS
1000.0000 mg | ORAL_TABLET | Freq: Once | ORAL | Status: AC
  Administered 2015-03-01: 1000 mg via ORAL
  Filled 2015-03-01: qty 4

## 2015-03-01 NOTE — ED Notes (Signed)
36 yof presents to Ed with c/o vaginal pain, burning, and itching since Friday. Patient denies any vaginal discharge. Patient took monistat, on time dose, on Friday night with no relief. Patient has dysuria. Patient is sexually active. Patient denies any use of birthcontrol/condoms. Last sexual intercourse was 2 weeks ago.

## 2015-03-01 NOTE — Discharge Instructions (Signed)
Urinary Tract Infection Urinary tract infections (UTIs) can develop anywhere along your urinary tract. Your urinary tract is your body's drainage system for removing wastes and extra water. Your urinary tract includes two kidneys, two ureters, a bladder, and a urethra. Your kidneys are a pair of bean-shaped organs. Each kidney is about the size of your fist. They are located below your ribs, one on each side of your spine. CAUSES Infections are caused by microbes, which are microscopic organisms, including fungi, viruses, and bacteria. These organisms are so small that they can only be seen through a microscope. Bacteria are the microbes that most commonly cause UTIs. SYMPTOMS  Symptoms of UTIs may vary by age and gender of the patient and by the location of the infection. Symptoms in young women typically include a frequent and intense urge to urinate and a painful, burning feeling in the bladder or urethra during urination. Older women and men are more likely to be tired, shaky, and weak and have muscle aches and abdominal pain. A fever may mean the infection is in your kidneys. Other symptoms of a kidney infection include pain in your back or sides below the ribs, nausea, and vomiting. DIAGNOSIS To diagnose a UTI, your caregiver will ask you about your symptoms. Your caregiver will also ask you to provide a urine sample. The urine sample will be tested for bacteria and white blood cells. White blood cells are made by your body to help fight infection. TREATMENT  Typically, UTIs can be treated with medication. Because most UTIs are caused by a bacterial infection, they usually can be treated with the use of antibiotics. The choice of antibiotic and length of treatment depend on your symptoms and the type of bacteria causing your infection. HOME CARE INSTRUCTIONS  If you were prescribed antibiotics, take them exactly as your caregiver instructs you. Finish the medication even if you feel better after  you have only taken some of the medication.  Drink enough water and fluids to keep your urine clear or pale yellow.  Avoid caffeine, tea, and carbonated beverages. They tend to irritate your bladder.  Empty your bladder often. Avoid holding urine for long periods of time.  Empty your bladder before and after sexual intercourse.  After a bowel movement, women should cleanse from front to back. Use each tissue only once. SEEK MEDICAL CARE IF:   You have back pain.  You develop a fever.  Your symptoms do not begin to resolve within 3 days. SEEK IMMEDIATE MEDICAL CARE IF:   You have severe back pain or lower abdominal pain.  You develop chills.  You have nausea or vomiting.  You have continued burning or discomfort with urination. MAKE SURE YOU:   Understand these instructions.  Will watch your condition.  Will get help right away if you are not doing well or get worse.   This information is not intended to replace advice given to you by your health care provider. Make sure you discuss any questions you have with your health care provider.   Document Released: 02/09/2005 Document Revised: 01/21/2015 Document Reviewed: 06/10/2011 Elsevier Interactive Patient Education 2016 [employer].   Sexually Transmitted Disease A sexually transmitted disease (STD) is a disease or infection often passed to another person during sex. However, STDs can be passed through nonsexual ways. An STD can be passed through:  Spit (saliva).  Semen.  Blood.  Mucus from the vagina.  Pee (urine). HOW CAN I LESSEN MY CHANCES OF GETTING AN STD?  Use:  Latex condoms.  Water-soluble lubricants with condoms. Do not use petroleum jelly or oils.  Dental dams. These are small pieces of latex that are used as a barrier during oral sex.  Avoid having more than one sex partner.  Do not have sex with someone who has other sex partners.  Do not have sex with anyone you do not know or who is at  high risk for an STD.  Avoid risky sex that can break your skin.  Do not have sex if you have open sores on your mouth or skin.  Avoid drinking too much alcohol or taking illegal drugs. Alcohol and drugs can affect your good judgment.  Avoid oral and anal sex acts.  Get shots (vaccines) for HPV and hepatitis.  If you are at risk of being infected with HIV, it is advised that you take a certain medicine daily to prevent HIV infection. This is called pre-exposure prophylaxis (PrEP). You may be at risk if:  You are a man who has sex with other men (MSM).  You are attracted to the opposite sex (heterosexual) and are having sex with more than one partner.  You take drugs with a needle.  You have sex with someone who has HIV.  Talk with your doctor about if you are at high risk of being infected with HIV. If you begin to take PrEP, get tested for HIV first. Get tested every 3 months for as long as you are taking PrEP.  Get tested for STDs every year if you are sexually active. If you are treated for an STD, get tested again 3 months after you are treated. WHAT SHOULD I DO IF I THINK I HAVE AN STD?  See your doctor.  Tell your sex partner(s) that you have an STD. They should be tested and treated.  Do not have sex until your doctor says it is okay. WHEN SHOULD I GET HELP? Get help right away if:  You have bad belly (abdominal) pain.  You are a man and have puffiness (swelling) or pain in your testicles.  You are a woman and have puffiness in your vagina.   This information is not intended to replace advice given to you by your health care provider. Make sure you discuss any questions you have with your health care provider.   Document Released: 06/09/2004 Document Revised: 05/23/2014 Document Reviewed: 10/26/2012 Elsevier Interactive Patient Education [employer].

## 2015-03-01 NOTE — ED Notes (Signed)
Physician at bedside.
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
## 2015-03-02 LAB — GC/CHLAMYDIA PROBE AMP (~~LOC~~) NOT AT ARMC
Chlamydia: NEGATIVE
Neisseria Gonorrhea: NEGATIVE

## 2015-03-02 LAB — HIV ANTIBODY (ROUTINE TESTING W REFLEX): HIV Screen 4th Generation wRfx: NONREACTIVE

## 2015-03-07 ENCOUNTER — Emergency Department (HOSPITAL_COMMUNITY)
Admission: EM | Admit: 2015-03-07 | Discharge: 2015-03-07 | Disposition: A | Discharge status: Home / Self Care | Payer: Commercial Managed Care - HMO | Attending: Emergency Medicine | Admitting: Emergency Medicine

## 2015-03-07 ENCOUNTER — Emergency Department (HOSPITAL_COMMUNITY): Payer: Commercial Managed Care - HMO

## 2015-03-07 DIAGNOSIS — Y998 Other external cause status: Secondary | ICD-10-CM | POA: Insufficient documentation

## 2015-03-07 DIAGNOSIS — Z8719 Personal history of other diseases of the digestive system: Secondary | ICD-10-CM | POA: Diagnosis not present

## 2015-03-07 DIAGNOSIS — R531 Weakness: Secondary | ICD-10-CM | POA: Diagnosis not present

## 2015-03-07 DIAGNOSIS — Y9289 Other specified places as the place of occurrence of the external cause: Secondary | ICD-10-CM | POA: Insufficient documentation

## 2015-03-07 DIAGNOSIS — T7849XA Other allergy, initial encounter: Secondary | ICD-10-CM | POA: Diagnosis not present

## 2015-03-07 DIAGNOSIS — Z87891 Personal history of nicotine dependence: Secondary | ICD-10-CM | POA: Diagnosis not present

## 2015-03-07 DIAGNOSIS — G8929 Other chronic pain: Secondary | ICD-10-CM | POA: Diagnosis not present

## 2015-03-07 DIAGNOSIS — Z7951 Long term (current) use of inhaled steroids: Secondary | ICD-10-CM | POA: Diagnosis not present

## 2015-03-07 DIAGNOSIS — Z792 Long term (current) use of antibiotics: Secondary | ICD-10-CM | POA: Insufficient documentation

## 2015-03-07 DIAGNOSIS — J449 Chronic obstructive pulmonary disease, unspecified: Secondary | ICD-10-CM | POA: Diagnosis not present

## 2015-03-07 DIAGNOSIS — X58XXXA Exposure to other specified factors, initial encounter: Secondary | ICD-10-CM | POA: Diagnosis not present

## 2015-03-07 DIAGNOSIS — Z8781 Personal history of (healed) traumatic fracture: Secondary | ICD-10-CM | POA: Diagnosis not present

## 2015-03-07 DIAGNOSIS — R4701 Aphasia: Secondary | ICD-10-CM | POA: Diagnosis present

## 2015-03-07 DIAGNOSIS — T7840XA Allergy, unspecified, initial encounter: Secondary | ICD-10-CM

## 2015-03-07 DIAGNOSIS — Z79899 Other long term (current) drug therapy: Secondary | ICD-10-CM | POA: Diagnosis not present

## 2015-03-07 DIAGNOSIS — R4781 Slurred speech: Secondary | ICD-10-CM | POA: Diagnosis not present

## 2015-03-07 LAB — RAPID URINE DRUG SCREEN, HOSP PERFORMED
Amphetamines: NOT DETECTED
BARBITURATES: NOT DETECTED
Benzodiazepines: NOT DETECTED
COCAINE: NOT DETECTED
Opiates: NOT DETECTED
TETRAHYDROCANNABINOL: NOT DETECTED

## 2015-03-07 LAB — COMPREHENSIVE METABOLIC PANEL
ALT: 15 U/L (ref 14–54)
ANION GAP: 5 (ref 5–15)
AST: 21 U/L (ref 15–41)
Albumin: 4.3 g/dL (ref 3.5–5.0)
Alkaline Phosphatase: 75 U/L (ref 38–126)
BUN: 14 mg/dL (ref 6–20)
CALCIUM: 9.5 mg/dL (ref 8.9–10.3)
CHLORIDE: 104 mmol/L (ref 101–111)
CO2: 31 mmol/L (ref 22–32)
Creatinine, Ser: 0.83 mg/dL (ref 0.44–1.00)
GFR calc Af Amer: >60 mL/min (ref >60)
Glucose, Bld: 91 mg/dL (ref 65–99)
POTASSIUM: 4.1 mmol/L (ref 3.5–5.1)
Sodium: 140 mmol/L (ref 135–145)
TOTAL PROTEIN: 7.7 g/dL (ref 6.5–8.1)
Total Bilirubin: 0.4 mg/dL (ref 0.3–1.2)

## 2015-03-07 LAB — CBC WITH DIFFERENTIAL/PLATELET
BASOS ABS: 0 10*3/uL (ref 0.0–0.1)
BASOS PCT: 1 %
EOS PCT: 4 %
Eosinophils Absolute: 0.2 10*3/uL (ref 0.0–0.7)
HCT: 39.2 % (ref 36.0–46.0)
Hemoglobin: 12.6 g/dL (ref 12.0–15.0)
Lymphocytes Relative: 51 %
Lymphs Abs: 2.9 10*3/uL (ref 0.7–4.0)
MCH: 29.1 pg (ref 26.0–34.0)
MCHC: 32.1 g/dL (ref 30.0–36.0)
MCV: 90.5 fL (ref 78.0–100.0)
MONO ABS: 0.4 10*3/uL (ref 0.1–1.0)
Monocytes Relative: 7 %
Neutro Abs: 2.1 10*3/uL (ref 1.7–7.7)
Neutrophils Relative %: 37 %
PLATELETS: 317 10*3/uL (ref 150–400)
RBC: 4.33 MIL/uL (ref 3.87–5.11)
RDW: 14.4 % (ref 11.5–15.5)
WBC: 5.6 10*3/uL (ref 4.0–10.5)

## 2015-03-07 LAB — CBG MONITORING, ED: GLUCOSE-CAPILLARY: 102 mg/dL — AB (ref 65–99)

## 2015-03-07 LAB — URINALYSIS, ROUTINE W REFLEX MICROSCOPIC
BILIRUBIN URINE: NEGATIVE
Glucose, UA: NEGATIVE mg/dL
Hgb urine dipstick: NEGATIVE
KETONES UR: NEGATIVE mg/dL
LEUKOCYTES UA: NEGATIVE
Nitrite: NEGATIVE
PH: 7.5 (ref 5.0–8.0)
PROTEIN: NEGATIVE mg/dL
Specific Gravity, Urine: 1.015 (ref 1.005–1.030)
Urobilinogen, UA: 0.2 mg/dL (ref 0.0–1.0)

## 2015-03-07 LAB — ETHANOL: Alcohol, Ethyl (B): <5 mg/dL (ref <5)

## 2015-03-07 MED ORDER — SODIUM CHLORIDE 0.9 % IV BOLUS (SEPSIS)
1000.0000 mL | Freq: Once | INTRAVENOUS | Status: AC
  Administered 2015-03-07: 1000 mL via INTRAVENOUS

## 2015-03-07 MED ORDER — FAMOTIDINE IN NACL 20-0.9 MG/50ML-% IV SOLN
20.0000 mg | Freq: Once | INTRAVENOUS | Status: AC
  Administered 2015-03-07: 20 mg via INTRAVENOUS
  Filled 2015-03-07: qty 50

## 2015-03-07 MED ORDER — DIPHENHYDRAMINE HCL 50 MG/ML IJ SOLN
12.5000 mg | Freq: Once | INTRAMUSCULAR | Status: AC
  Administered 2015-03-07: 12.5 mg via INTRAVENOUS
  Filled 2015-03-07: qty 1

## 2015-03-07 MED ORDER — METHYLPREDNISOLONE SODIUM SUCC 125 MG IJ SOLR
125.0000 mg | Freq: Once | INTRAMUSCULAR | Status: AC
  Administered 2015-03-07: 125 mg via INTRAVENOUS
  Filled 2015-03-07: qty 2

## 2015-03-07 NOTE — Discharge Instructions (Signed)
Tests were normal. Follow-up your primary care doctor. °

## 2015-03-07 NOTE — ED Notes (Signed)
Pt ambulated to restroom with steady gait.
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
## 2015-03-07 NOTE — ED Notes (Signed)
Patient c/o left chest pain "like a bite" onset today at 1600, slurred stammering speech, weakness, right sided facial weakness, right arm weakness. All symptoms are new, pt normally has no weakness or speech difficulty.

## 2015-03-12 ENCOUNTER — Ambulatory Visit: Payer: Medicare Other | Admitting: Pulmonary Disease

## 2015-05-22 ENCOUNTER — Telehealth: Payer: Self-pay | Admitting: Pulmonary Disease

## 2015-05-22 DIAGNOSIS — J449 Chronic obstructive pulmonary disease, unspecified: Secondary | ICD-10-CM

## 2015-05-22 NOTE — Telephone Encounter (Signed)
Patient says that she can no longer use [employer] for her oxygen because she has switched to [employer] and they do not have a contract with [employer].  They advised patient that she needs to get her oxygen and Nebulizer from [place].  Patient says she uses 3L at night and 3L prn during the day.  Patient needs O2 ASAP as [employer] has not brought her any new tanks and she is out of oxygen.  Order entered STAT for patient to obtain new oxygen from [place].

## 2015-05-22 NOTE — Telephone Encounter (Signed)
I received a VM from [facility] , after our conversation earlier, stating that she spoke with the patient.  Per Arbie Cookey, pt stated she received 02 tanks from [place] today.  Arbie Cookey stated pt told her that she had been by her PCP office yesterday and that her PCP office was supposed to send [facility] needed info for pt's 02.  Per Arbie Cookey she advised the pt that Huey Romans has not received anything from pt's PCP as yet and to f/u with the PCP office to have them resend the information.

## 2015-05-22 NOTE — Telephone Encounter (Signed)
Spoke with [place] regarding this patient's O2.  She said that [employer] is required to provide patient with O2 until she is set up with another DME company.  [facility] has advised patient a month ago that they no longer would be able to cover her O2 and that she would have to pay out of pocket if she doesn't get established with a new DME company.  According to Arbie Cookey @[employer]  pt has known for a month that she needs to be seen and get 02 sats done by the last OV note is from Dr. Gwenette Greet and CMN is from 2013...too old.  Patient should have been seen and had O2 Sats done.  Patient has been contacted by [facility]ue[facility] Romans to do so, but has not done this.  Arbie Cookey from [place] says that she will contact patient again. FYI to Dr. Ashok Cordia

## 2015-06-17 ENCOUNTER — Other Ambulatory Visit: Payer: Self-pay | Admitting: Pulmonary Disease

## 2015-06-24 DIAGNOSIS — J449 Chronic obstructive pulmonary disease, unspecified: Secondary | ICD-10-CM | POA: Diagnosis not present

## 2015-06-25 DIAGNOSIS — Z131 Encounter for screening for diabetes mellitus: Secondary | ICD-10-CM | POA: Diagnosis not present

## 2015-06-25 DIAGNOSIS — J449 Chronic obstructive pulmonary disease, unspecified: Secondary | ICD-10-CM | POA: Diagnosis not present

## 2015-06-25 DIAGNOSIS — Z01118 Encounter for examination of ears and hearing with other abnormal findings: Secondary | ICD-10-CM | POA: Diagnosis not present

## 2015-06-25 DIAGNOSIS — E785 Hyperlipidemia, unspecified: Secondary | ICD-10-CM | POA: Diagnosis not present

## 2015-06-25 DIAGNOSIS — Z0000 Encounter for general adult medical examination without abnormal findings: Secondary | ICD-10-CM | POA: Diagnosis not present

## 2015-07-10 DIAGNOSIS — R0602 Shortness of breath: Secondary | ICD-10-CM | POA: Diagnosis not present

## 2015-07-17 ENCOUNTER — Ambulatory Visit: Payer: Commercial Managed Care - HMO | Admitting: Pulmonary Disease

## 2015-07-17 DIAGNOSIS — E785 Hyperlipidemia, unspecified: Secondary | ICD-10-CM | POA: Diagnosis not present

## 2015-07-17 DIAGNOSIS — J449 Chronic obstructive pulmonary disease, unspecified: Secondary | ICD-10-CM | POA: Diagnosis not present

## 2015-07-17 DIAGNOSIS — J209 Acute bronchitis, unspecified: Secondary | ICD-10-CM | POA: Diagnosis not present

## 2015-07-17 DIAGNOSIS — Z720 Tobacco use: Secondary | ICD-10-CM | POA: Diagnosis not present

## 2015-07-22 DIAGNOSIS — J449 Chronic obstructive pulmonary disease, unspecified: Secondary | ICD-10-CM | POA: Diagnosis not present

## 2015-07-24 DIAGNOSIS — J449 Chronic obstructive pulmonary disease, unspecified: Secondary | ICD-10-CM | POA: Diagnosis not present

## 2015-07-28 ENCOUNTER — Encounter: Payer: Self-pay | Admitting: Pulmonary Disease

## 2015-07-28 ENCOUNTER — Ambulatory Visit (INDEPENDENT_AMBULATORY_CARE_PROVIDER_SITE_OTHER): Payer: Medicare Other | Admitting: Pulmonary Disease

## 2015-07-28 VITALS — BP 118/68 | HR 88 | Ht 63.0 in | Wt 178.8 lb

## 2015-07-28 DIAGNOSIS — J9611 Chronic respiratory failure with hypoxia: Secondary | ICD-10-CM | POA: Diagnosis not present

## 2015-07-28 DIAGNOSIS — Z87891 Personal history of nicotine dependence: Secondary | ICD-10-CM | POA: Diagnosis not present

## 2015-07-28 DIAGNOSIS — J449 Chronic obstructive pulmonary disease, unspecified: Secondary | ICD-10-CM

## 2015-07-28 DIAGNOSIS — K219 Gastro-esophageal reflux disease without esophagitis: Secondary | ICD-10-CM | POA: Diagnosis not present

## 2015-07-28 MED ORDER — AEROCHAMBER Z-STAT PLUS MISC: Status: DC

## 2015-07-28 NOTE — Patient Instructions (Addendum)
   Continue using your Symbicort & Spiriva as prescribed. Use the spacer with your Symbicort inhaler.  We will be doing a breathing & walking test at your next appointment.  Our lung cancer screening coordinator will call you to discuss doing a chest CT to screen you for lung cancer given your history of smoking.  I will see you back in 3 months or sooner if needed. Call me if you have any new breathing problems.  TESTS ORDERED: 1. Full pulmonary function testing at next appointment 2. Six-minute walk test on room air at next appointment
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
## 2015-07-30 ENCOUNTER — Telehealth: Payer: Self-pay | Admitting: Pulmonary Disease

## 2015-07-30 MED ORDER — TIOTROPIUM BROMIDE MONOHYDRATE 18 MCG IN CAPS: 18.0000 ug | ORAL_CAPSULE | Freq: Every day | RESPIRATORY_TRACT | Status: DC

## 2015-07-30 NOTE — Telephone Encounter (Signed)
Called spoke with pt. Aware RX for spiriva has been sent to the pharmacy. Nothing further needed

## 2015-08-06 ENCOUNTER — Encounter: Payer: Self-pay | Admitting: *Deleted

## 2015-08-06 ENCOUNTER — Other Ambulatory Visit: Payer: Self-pay | Admitting: Acute Care

## 2015-08-06 DIAGNOSIS — Z87891 Personal history of nicotine dependence: Secondary | ICD-10-CM

## 2015-08-10 DIAGNOSIS — J449 Chronic obstructive pulmonary disease, unspecified: Secondary | ICD-10-CM | POA: Diagnosis not present

## 2015-08-10 DIAGNOSIS — E785 Hyperlipidemia, unspecified: Secondary | ICD-10-CM | POA: Diagnosis not present

## 2015-08-10 DIAGNOSIS — Z720 Tobacco use: Secondary | ICD-10-CM | POA: Diagnosis not present

## 2015-08-10 DIAGNOSIS — E559 Vitamin D deficiency, unspecified: Secondary | ICD-10-CM | POA: Diagnosis not present

## 2015-08-11 ENCOUNTER — Ambulatory Visit (INDEPENDENT_AMBULATORY_CARE_PROVIDER_SITE_OTHER): Payer: Medicare Other | Admitting: Acute Care

## 2015-08-11 ENCOUNTER — Encounter: Payer: Self-pay | Admitting: Acute Care

## 2015-08-11 ENCOUNTER — Ambulatory Visit (INDEPENDENT_AMBULATORY_CARE_PROVIDER_SITE_OTHER)
Admission: RE | Admit: 2015-08-11 | Discharge: 2015-08-11 | Disposition: A | Discharge status: Home / Self Care | Payer: Medicare Other | Source: Ambulatory Visit | Attending: Acute Care | Admitting: Acute Care

## 2015-08-11 DIAGNOSIS — Z87891 Personal history of nicotine dependence: Secondary | ICD-10-CM

## 2015-08-11 NOTE — Progress Notes (Signed)
Shared Decision Making Visit Lung Cancer Screening Program [phone])   Eligibility:  Age 55 y.o.  Pack Years Smoking History Calculation 35 pack year smoking history (# packs/per year x # years smoked)  Recent History of coughing up blood  no  Unexplained weight loss? no ( >Than 15 pounds within the last 6 months )  Prior History Lung / other cancer no (Diagnosis within the last 5 years already requiring surveillance chest CT Scans).  Smoking Status Former Smoker  Former Smokers: Years since quit: 6 years  Quit Date: 2011  Visit Components:  Discussion included one or more decision making aids. yes  Discussion included risk/benefits of screening. yes  Discussion included potential follow up diagnostic testing for abnormal scans. yes  Discussion included meaning and risk of over diagnosis. yes  Discussion included meaning and risk of False Positives. yes  Discussion included meaning of total radiation exposure. yes  Counseling Included:  Importance of adherence to annual lung cancer LDCT screening. yes  Impact of comorbidities on ability to participate in the program. yes  Ability and willingness to under diagnostic treatment. yes  Smoking Cessation Counseling:  Current Smokers:   Discussed importance of smoking cessation. Not applicable former smoker  Information about tobacco cessation classes and interventions provided to patient. Not applicable former smoker  Patient provided with "ticket" for LDCT Scan. yes  Symptomatic Patient. no  Counseling not applicable  Diagnosis Code: Tobacco Use Z72.0  Asymptomatic Patient yes  Counseling not applicable, former smoker  Former Smokers:   Discussed the importance of maintaining cigarette abstinence. yes  Diagnosis Code: Personal History of Nicotine Dependence. [id number]  Information about tobacco cessation classes and interventions provided to patient. Yes  Patient provided with "ticket" for LDCT Scan.  yes  Written Order for Lung Cancer Screening with LDCT placed in Epic. Yes (CT Chest Lung Cancer Screening Low Dose W/O CM) [id number] Z12.2-Screening of respiratory organs Z87.891-Personal history of nicotine dependence  I spent 15 minutes of face to face time with Mrs. Bomba discussing the risks and benefits of lung cancer screening. We viewed a power point together that explained in detail the above noted topics. We took the time to pause the power point at intervals to allow for questions to be asked and answered to ensure understanding. We discussed that Mrs. Cocks had taken the single most powerful action possible to decrease her risk of developing lung cancer when she quit smoking. I counseled her to remain smoke free, and to contact me if she ever had the desire to smoke again so that I can provide resources and tools to help support the effort to remain smoke free. We discussed the time and location of the scan, and that either [place] or I will call with the results within  24-48 hours of receiving them. Mrs. Privette has my card and contact information in the event she needs to speak with me, in addition to a copy of the power point we reviewed as a resource. Mrs. Eichorn verbalized understanding of all of the above and had no further questions upon leaving the office.    Magdalen Spatz, NP 08/11/2015

## 2015-08-14 ENCOUNTER — Other Ambulatory Visit: Payer: Self-pay | Admitting: Pulmonary Disease

## 2015-08-22 DIAGNOSIS — J449 Chronic obstructive pulmonary disease, unspecified: Secondary | ICD-10-CM | POA: Diagnosis not present

## 2015-09-01 DIAGNOSIS — Z87891 Personal history of nicotine dependence: Secondary | ICD-10-CM | POA: Insufficient documentation

## 2015-09-01 DIAGNOSIS — N390 Urinary tract infection, site not specified: Secondary | ICD-10-CM | POA: Insufficient documentation

## 2015-09-01 DIAGNOSIS — J9690 Respiratory failure, unspecified, unspecified whether with hypoxia or hypercapnia: Secondary | ICD-10-CM | POA: Diagnosis not present

## 2015-09-01 DIAGNOSIS — Z7951 Long term (current) use of inhaled steroids: Secondary | ICD-10-CM | POA: Diagnosis not present

## 2015-09-01 DIAGNOSIS — R300 Dysuria: Secondary | ICD-10-CM | POA: Diagnosis present

## 2015-09-01 DIAGNOSIS — J449 Chronic obstructive pulmonary disease, unspecified: Secondary | ICD-10-CM | POA: Diagnosis not present

## 2015-09-02 ENCOUNTER — Encounter (HOSPITAL_COMMUNITY): Payer: Self-pay | Admitting: *Deleted

## 2015-09-02 ENCOUNTER — Emergency Department (HOSPITAL_COMMUNITY)
Admission: EM | Admit: 2015-09-02 | Discharge: 2015-09-02 | Disposition: A | Discharge status: Home / Self Care | Payer: Medicare Other | Attending: Emergency Medicine | Admitting: Emergency Medicine

## 2015-09-02 DIAGNOSIS — N390 Urinary tract infection, site not specified: Secondary | ICD-10-CM

## 2015-09-02 LAB — URINE MICROSCOPIC-ADD ON

## 2015-09-02 LAB — URINALYSIS, ROUTINE W REFLEX MICROSCOPIC
BILIRUBIN URINE: NEGATIVE
Glucose, UA: NEGATIVE mg/dL
Ketones, ur: NEGATIVE mg/dL
NITRITE: NEGATIVE
PH: 5.5 (ref 5.0–8.0)
Protein, ur: NEGATIVE mg/dL
SPECIFIC GRAVITY, URINE: 1.021 (ref 1.005–1.030)

## 2015-09-02 MED ORDER — CEPHALEXIN 500 MG PO CAPS
1000.0000 mg | ORAL_CAPSULE | Freq: Once | ORAL | Status: AC
  Administered 2015-09-02: 1000 mg via ORAL
  Filled 2015-09-02: qty 2

## 2015-09-02 MED ORDER — PHENAZOPYRIDINE HCL 200 MG PO TABS
200.0000 mg | ORAL_TABLET | Freq: Once | ORAL | Status: AC
  Administered 2015-09-02: 200 mg via ORAL
  Filled 2015-09-02: qty 1

## 2015-09-02 MED ORDER — PHENAZOPYRIDINE HCL 200 MG PO TABS: 200.0000 mg | ORAL_TABLET | Freq: Three times a day (TID) | ORAL | Status: DC

## 2015-09-02 MED ORDER — CEPHALEXIN 500 MG PO CAPS: 500.0000 mg | ORAL_CAPSULE | Freq: Two times a day (BID) | ORAL | Status: DC
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
## 2015-09-02 NOTE — ED Notes (Signed)
Pt states that she is having burning and itching to her perineal area; pt denies vaginal discharge; pt states that it burns when she urinates; pt also c/o pain during sexual intercourse

## 2015-09-03 LAB — URINE CULTURE

## 2015-09-08 ENCOUNTER — Other Ambulatory Visit: Payer: Self-pay | Admitting: Pulmonary Disease

## 2015-09-21 DIAGNOSIS — J449 Chronic obstructive pulmonary disease, unspecified: Secondary | ICD-10-CM | POA: Diagnosis not present

## 2015-09-25 DIAGNOSIS — J449 Chronic obstructive pulmonary disease, unspecified: Secondary | ICD-10-CM | POA: Diagnosis not present

## 2015-10-22 DIAGNOSIS — J209 Acute bronchitis, unspecified: Secondary | ICD-10-CM | POA: Diagnosis not present

## 2015-10-22 DIAGNOSIS — E785 Hyperlipidemia, unspecified: Secondary | ICD-10-CM | POA: Diagnosis not present

## 2015-10-22 DIAGNOSIS — J449 Chronic obstructive pulmonary disease, unspecified: Secondary | ICD-10-CM | POA: Diagnosis not present

## 2015-10-22 DIAGNOSIS — Z720 Tobacco use: Secondary | ICD-10-CM | POA: Diagnosis not present

## 2015-11-10 ENCOUNTER — Ambulatory Visit (INDEPENDENT_AMBULATORY_CARE_PROVIDER_SITE_OTHER): Payer: Medicare Other | Admitting: Pulmonary Disease

## 2015-11-10 ENCOUNTER — Encounter: Payer: Self-pay | Admitting: Pulmonary Disease

## 2015-11-10 VITALS — BP 116/66 | HR 86 | Ht 62.25 in | Wt 170.0 lb

## 2015-11-10 DIAGNOSIS — J441 Chronic obstructive pulmonary disease with (acute) exacerbation: Secondary | ICD-10-CM

## 2015-11-10 DIAGNOSIS — J9611 Chronic respiratory failure with hypoxia: Secondary | ICD-10-CM | POA: Diagnosis not present

## 2015-11-10 DIAGNOSIS — J449 Chronic obstructive pulmonary disease, unspecified: Secondary | ICD-10-CM

## 2015-11-10 DIAGNOSIS — K449 Diaphragmatic hernia without obstruction or gangrene: Secondary | ICD-10-CM

## 2015-11-10 DIAGNOSIS — R0600 Dyspnea, unspecified: Secondary | ICD-10-CM

## 2015-11-10 DIAGNOSIS — K219 Gastro-esophageal reflux disease without esophagitis: Secondary | ICD-10-CM

## 2015-11-10 LAB — PULMONARY FUNCTION TEST
DL/VA % PRED: 70 %
DL/VA: 3.23 ml/min/mmHg/L
DLCO COR % PRED: 48 %
DLCO cor: 10.61 ml/min/mmHg
DLCO unc % pred: 45 %
DLCO unc: 9.94 ml/min/mmHg
FEF 25-75 POST: 0.34 L/s
FEF 25-75 PRE: 0.4 L/s
FEF2575-%CHANGE-POST: -14 %
FEF2575-%PRED-PRE: 19 %
FEF2575-%Pred-Post: 16 %
FEV1-%Change-Post: 7 %
FEV1-%PRED-POST: 42 %
FEV1-%Pred-Pre: 39 %
FEV1-Post: 0.85 L
FEV1-Pre: 0.79 L
FEV1FVC-%CHANGE-POST: -1 %
FEV1FVC-%PRED-PRE: 66 %
FEV6-%CHANGE-POST: 5 %
FEV6-%Pred-Post: 62 %
FEV6-%Pred-Pre: 59 %
FEV6-Post: 1.54 L
FEV6-Pre: 1.46 L
FEV6FVC-%Change-Post: -3 %
FEV6FVC-%PRED-POST: 98 %
FEV6FVC-%Pred-Pre: 101 %
FVC-%CHANGE-POST: 8 %
FVC-%PRED-PRE: 58 %
FVC-%Pred-Post: 63 %
FVC-POST: 1.62 L
FVC-PRE: 1.49 L
PRE FEV1/FVC RATIO: 53 %
Post FEV1/FVC ratio: 53 %
Post FEV6/FVC ratio: 95 %
Pre FEV6/FVC Ratio: 98 %

## 2015-11-10 MED ORDER — RANITIDINE HCL 150 MG PO TABS: 150.0000 mg | ORAL_TABLET | Freq: Every day | ORAL | Status: DC

## 2015-11-10 MED ORDER — PREDNISONE 20 MG PO TABS: 40.0000 mg | ORAL_TABLET | Freq: Every day | ORAL | Status: DC

## 2015-11-10 NOTE — Patient Instructions (Addendum)
   Call me if your breathing or cough get any worse.  We will repeat your breathing test at your next appointment to continue to follow your lung function.  Pay attention to the liquid at your work and make note if your breathing is worse around it because you may need to avoid exposure if it's causing you lung problems.  I will see you back in 3 months or sooner if needed.  TESTS ORDERED: 1. Spirometry with bronchodilator challenge at next appointment 2. Sputum Culture AFB, Fungus, & Bacteria

## 2015-11-10 NOTE — Progress Notes (Signed)
Result reviewed. 

## 2015-11-10 NOTE — Progress Notes (Signed)
PFT done today. 
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
## 2015-11-13 ENCOUNTER — Other Ambulatory Visit: Payer: Medicare Other

## 2015-11-13 DIAGNOSIS — J449 Chronic obstructive pulmonary disease, unspecified: Secondary | ICD-10-CM

## 2015-11-16 LAB — RESPIRATORY CULTURE OR RESPIRATORY AND SPUTUM CULTURE: ORGANISM ID, BACTERIA: NORMAL

## 2015-11-19 ENCOUNTER — Telehealth: Payer: Self-pay | Admitting: Pulmonary Disease

## 2015-11-19 ENCOUNTER — Other Ambulatory Visit: Payer: Self-pay | Admitting: *Deleted

## 2015-11-19 DIAGNOSIS — J438 Other emphysema: Secondary | ICD-10-CM

## 2015-11-19 LAB — AFB CULTURE WITH SMEAR (NOT AT ARMC)

## 2015-11-19 MED ORDER — ALBUTEROL SULFATE (2.5 MG/3ML) 0.083% IN NEBU: 2.5000 mg | INHALATION_SOLUTION | Freq: Four times a day (QID) | RESPIRATORY_TRACT | Status: DC | PRN

## 2015-11-19 NOTE — Telephone Encounter (Signed)
Patient calling to get refill on Albuterol Neb Solution. Rx sent to pharmacy. Patient aware. Nothing further needed.

## 2015-11-21 DIAGNOSIS — J449 Chronic obstructive pulmonary disease, unspecified: Secondary | ICD-10-CM | POA: Diagnosis not present

## 2015-11-25 ENCOUNTER — Telehealth: Payer: Self-pay | Admitting: Pulmonary Disease

## 2015-11-25 DIAGNOSIS — J438 Other emphysema: Secondary | ICD-10-CM

## 2015-11-25 MED ORDER — ALBUTEROL SULFATE (2.5 MG/3ML) 0.083% IN NEBU: 2.5000 mg | INHALATION_SOLUTION | Freq: Four times a day (QID) | RESPIRATORY_TRACT | Status: DC

## 2015-11-25 NOTE — Telephone Encounter (Signed)
Spoke w/ pt. We sent in RX for albuterol for her. It states q6hrs prn. Since pt is going through DME for this, the RX can't state PRN d/t insurance will not cover RX this way. Dr. Ashok Cordia, is it okay to resend RX w/o PRN on it.  -note pt understands the medication is PRN only though. thanks

## 2015-11-25 NOTE — Telephone Encounter (Signed)
Spoke with pt and is aware [facility]. Nothing further needed

## 2015-11-25 NOTE — Telephone Encounter (Signed)
That's fine as long as she understands and we clearly document it in our records.

## 2015-11-30 DIAGNOSIS — J449 Chronic obstructive pulmonary disease, unspecified: Secondary | ICD-10-CM | POA: Diagnosis not present

## 2015-12-08 ENCOUNTER — Telehealth: Payer: Self-pay

## 2015-12-08 NOTE — Telephone Encounter (Signed)
Opened in error

## 2015-12-14 DIAGNOSIS — R7303 Prediabetes: Secondary | ICD-10-CM | POA: Diagnosis not present

## 2015-12-14 DIAGNOSIS — R319 Hematuria, unspecified: Secondary | ICD-10-CM | POA: Diagnosis not present

## 2015-12-14 DIAGNOSIS — J449 Chronic obstructive pulmonary disease, unspecified: Secondary | ICD-10-CM | POA: Diagnosis not present

## 2015-12-14 DIAGNOSIS — R102 Pelvic and perineal pain: Secondary | ICD-10-CM | POA: Diagnosis not present

## 2015-12-14 DIAGNOSIS — Z720 Tobacco use: Secondary | ICD-10-CM | POA: Diagnosis not present

## 2015-12-14 DIAGNOSIS — K921 Melena: Secondary | ICD-10-CM | POA: Diagnosis not present

## 2015-12-14 DIAGNOSIS — E785 Hyperlipidemia, unspecified: Secondary | ICD-10-CM | POA: Diagnosis not present

## 2015-12-18 DIAGNOSIS — H5203 Hypermetropia, bilateral: Secondary | ICD-10-CM | POA: Diagnosis not present

## 2015-12-22 DIAGNOSIS — J449 Chronic obstructive pulmonary disease, unspecified: Secondary | ICD-10-CM | POA: Diagnosis not present

## 2016-01-01 LAB — AFB CULTURE WITH SMEAR (NOT AT ARMC): SOURCE: 0

## 2016-01-01 LAB — FUNGUS CULTURE W SMEAR

## 2016-01-13 DIAGNOSIS — E785 Hyperlipidemia, unspecified: Secondary | ICD-10-CM | POA: Diagnosis not present

## 2016-01-13 DIAGNOSIS — R739 Hyperglycemia, unspecified: Secondary | ICD-10-CM | POA: Diagnosis not present

## 2016-01-13 DIAGNOSIS — Z720 Tobacco use: Secondary | ICD-10-CM | POA: Diagnosis not present

## 2016-01-13 DIAGNOSIS — K921 Melena: Secondary | ICD-10-CM | POA: Diagnosis not present

## 2016-01-13 DIAGNOSIS — Z7689 Persons encountering health services in other specified circumstances: Secondary | ICD-10-CM | POA: Diagnosis not present

## 2016-01-13 DIAGNOSIS — J449 Chronic obstructive pulmonary disease, unspecified: Secondary | ICD-10-CM | POA: Diagnosis not present

## 2016-01-19 DIAGNOSIS — K5900 Constipation, unspecified: Secondary | ICD-10-CM | POA: Diagnosis not present

## 2016-01-19 DIAGNOSIS — K219 Gastro-esophageal reflux disease without esophagitis: Secondary | ICD-10-CM | POA: Diagnosis not present

## 2016-01-19 DIAGNOSIS — R1033 Periumbilical pain: Secondary | ICD-10-CM | POA: Diagnosis not present

## 2016-01-19 DIAGNOSIS — K625 Hemorrhage of anus and rectum: Secondary | ICD-10-CM | POA: Diagnosis not present

## 2016-01-20 ENCOUNTER — Other Ambulatory Visit: Payer: Self-pay | Admitting: Gastroenterology

## 2016-01-20 DIAGNOSIS — R1033 Periumbilical pain: Secondary | ICD-10-CM

## 2016-01-20 DIAGNOSIS — R634 Abnormal weight loss: Secondary | ICD-10-CM

## 2016-01-21 DIAGNOSIS — J449 Chronic obstructive pulmonary disease, unspecified: Secondary | ICD-10-CM | POA: Diagnosis not present

## 2016-01-22 DIAGNOSIS — J449 Chronic obstructive pulmonary disease, unspecified: Secondary | ICD-10-CM | POA: Diagnosis not present

## 2016-01-29 ENCOUNTER — Telehealth: Payer: Self-pay | Admitting: Pulmonary Disease

## 2016-01-29 NOTE — Telephone Encounter (Signed)
Spoke with pt and she states that she has received letter from [employer] for her to obtain documentation that she still qualifies for disability. Pt is wanting office visit notes that reflect her condition is getting worse instead of better. I reviewed the most recent OV notes with pt. She wants copies of these. I gave pt phone nbr to HIM to get records. Nothing further needed.

## 2016-02-01 ENCOUNTER — Ambulatory Visit
Admission: RE | Admit: 2016-02-01 | Discharge: 2016-02-01 | Disposition: A | Discharge status: Home / Self Care | Payer: Medicare Other | Source: Ambulatory Visit | Attending: Gastroenterology | Admitting: Gastroenterology

## 2016-02-01 DIAGNOSIS — R1033 Periumbilical pain: Secondary | ICD-10-CM | POA: Diagnosis not present

## 2016-02-01 DIAGNOSIS — R634 Abnormal weight loss: Secondary | ICD-10-CM

## 2016-02-01 MED ORDER — IOPAMIDOL (ISOVUE-300) INJECTION 61%
100.0000 mL | Freq: Once | INTRAVENOUS | Status: AC | PRN
  Administered 2016-02-01: 100 mL via INTRAVENOUS

## 2016-02-12 ENCOUNTER — Other Ambulatory Visit: Payer: Self-pay | Admitting: Pulmonary Disease

## 2016-02-12 DIAGNOSIS — Z720 Tobacco use: Secondary | ICD-10-CM | POA: Diagnosis not present

## 2016-02-12 DIAGNOSIS — E785 Hyperlipidemia, unspecified: Secondary | ICD-10-CM | POA: Diagnosis not present

## 2016-02-12 DIAGNOSIS — J449 Chronic obstructive pulmonary disease, unspecified: Secondary | ICD-10-CM | POA: Diagnosis not present

## 2016-02-12 LAB — PROCEDURE REPORT - SCANNED: Pap: NEGATIVE

## 2016-02-16 ENCOUNTER — Ambulatory Visit (INDEPENDENT_AMBULATORY_CARE_PROVIDER_SITE_OTHER): Payer: Medicare Other | Admitting: Pulmonary Disease

## 2016-02-16 ENCOUNTER — Encounter: Payer: Self-pay | Admitting: Pulmonary Disease

## 2016-02-16 VITALS — BP 110/70 | HR 88 | Ht 62.25 in | Wt 168.0 lb

## 2016-02-16 DIAGNOSIS — G4734 Idiopathic sleep related nonobstructive alveolar hypoventilation: Secondary | ICD-10-CM | POA: Diagnosis not present

## 2016-02-16 DIAGNOSIS — J449 Chronic obstructive pulmonary disease, unspecified: Secondary | ICD-10-CM

## 2016-02-16 DIAGNOSIS — K219 Gastro-esophageal reflux disease without esophagitis: Secondary | ICD-10-CM | POA: Diagnosis not present

## 2016-02-16 DIAGNOSIS — K449 Diaphragmatic hernia without obstruction or gangrene: Secondary | ICD-10-CM

## 2016-02-16 LAB — PULMONARY FUNCTION TEST
FEF 25-75 POST: 0.58 L/s
FEF 25-75 PRE: 0.3 L/s
FEF2575-%Change-Post: 94 %
FEF2575-%PRED-PRE: 14 %
FEF2575-%Pred-Post: 28 %
FEV1-%Change-Post: 32 %
FEV1-%PRED-POST: 43 %
FEV1-%PRED-PRE: 32 %
FEV1-POST: 0.86 L
FEV1-Pre: 0.65 L
FEV1FVC-%Change-Post: 5 %
FEV1FVC-%PRED-PRE: 65 %
FEV6-%Change-Post: 27 %
FEV6-%Pred-Post: 63 %
FEV6-%Pred-Pre: 49 %
FEV6-POST: 1.54 L
FEV6-Pre: 1.21 L
FEV6FVC-%CHANGE-POST: 1 %
FEV6FVC-%PRED-POST: 102 %
FEV6FVC-%Pred-Pre: 101 %
FVC-%Change-Post: 25 %
FVC-%PRED-PRE: 49 %
FVC-%Pred-Post: 61 %
FVC-POST: 1.57 L
FVC-PRE: 1.25 L
PRE FEV1/FVC RATIO: 52 %
Post FEV1/FVC ratio: 55 %
Post FEV6/FVC ratio: 99 %
Pre FEV6/FVC Ratio: 97 %

## 2016-02-16 NOTE — Addendum Note (Signed)
Addended by: Len Blalock on: 02/16/2016 04:20 PM   Modules accepted: Orders
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
## 2016-02-16 NOTE — Patient Instructions (Signed)
   Try using your spacer with your Symbicort inhaler.   Call me if you breathing worsens or you feel you are coughing or wheezing more.  Remember to the the Flu Vaccine in October.  I recommend the Prevnar 13 Vaccine which will cover you for more pneumonia and should be done at least 4 weeks after the Flu shot.  I will see you back with a breathing test in 2 months or sooner if needed.  TESTS ORDERED: 1. Spirometry with bronchodilator challenge at next appointment

## 2016-02-16 NOTE — Progress Notes (Signed)
Test reviewed.  

## 2016-02-21 DIAGNOSIS — J449 Chronic obstructive pulmonary disease, unspecified: Secondary | ICD-10-CM | POA: Diagnosis not present

## 2016-03-08 ENCOUNTER — Other Ambulatory Visit: Payer: Self-pay

## 2016-03-08 MED ORDER — RANITIDINE HCL 150 MG PO TABS: 150.0000 mg | ORAL_TABLET | Freq: Every day | ORAL | 3 refills | Status: DC

## 2016-03-09 ENCOUNTER — Other Ambulatory Visit: Payer: Self-pay | Admitting: Pulmonary Disease

## 2016-03-11 DIAGNOSIS — J449 Chronic obstructive pulmonary disease, unspecified: Secondary | ICD-10-CM | POA: Diagnosis not present

## 2016-03-21 DIAGNOSIS — E785 Hyperlipidemia, unspecified: Secondary | ICD-10-CM | POA: Diagnosis not present

## 2016-03-21 DIAGNOSIS — R739 Hyperglycemia, unspecified: Secondary | ICD-10-CM | POA: Diagnosis not present

## 2016-03-21 DIAGNOSIS — J449 Chronic obstructive pulmonary disease, unspecified: Secondary | ICD-10-CM | POA: Diagnosis not present

## 2016-03-21 DIAGNOSIS — R05 Cough: Secondary | ICD-10-CM | POA: Diagnosis not present

## 2016-03-23 ENCOUNTER — Telehealth: Payer: Self-pay | Admitting: Acute Care

## 2016-03-23 DIAGNOSIS — Z87891 Personal history of nicotine dependence: Secondary | ICD-10-CM

## 2016-03-23 DIAGNOSIS — J449 Chronic obstructive pulmonary disease, unspecified: Secondary | ICD-10-CM | POA: Diagnosis not present

## 2016-03-23 NOTE — Telephone Encounter (Signed)
This is a note to document a phone call to [name] on 08/14/2015. I called Mrs. Swiatek with the results of her low-dose screening CT which was done 08/11/2015. Her scan was read as a Lung RADS 1, negative study: no nodules or definitely benign nodules. Radiology recommendation is for a repeat LDCT in 12 months. I explained to her that we will order and schedule her annual scan for March 2018. I also explained to her that her scan indicated coronary artery atherosclerosis. I encouraged her to follow-up with her primary care physician regarding this. She verbalized understanding of the above and had no further questions at completion of the phone call

## 2016-04-07 ENCOUNTER — Other Ambulatory Visit: Payer: Self-pay | Admitting: Pulmonary Disease

## 2016-04-22 DIAGNOSIS — J449 Chronic obstructive pulmonary disease, unspecified: Secondary | ICD-10-CM | POA: Diagnosis not present

## 2016-05-02 ENCOUNTER — Ambulatory Visit: Payer: Medicare Other | Admitting: Pulmonary Disease

## 2016-05-02 DIAGNOSIS — J449 Chronic obstructive pulmonary disease, unspecified: Secondary | ICD-10-CM | POA: Diagnosis not present

## 2016-05-02 DIAGNOSIS — R739 Hyperglycemia, unspecified: Secondary | ICD-10-CM | POA: Diagnosis not present

## 2016-05-02 DIAGNOSIS — Z0000 Encounter for general adult medical examination without abnormal findings: Secondary | ICD-10-CM | POA: Diagnosis not present

## 2016-05-02 DIAGNOSIS — R05 Cough: Secondary | ICD-10-CM | POA: Diagnosis not present

## 2016-05-23 DIAGNOSIS — J449 Chronic obstructive pulmonary disease, unspecified: Secondary | ICD-10-CM | POA: Diagnosis not present

## 2016-05-26 ENCOUNTER — Ambulatory Visit: Payer: Medicare Other | Admitting: Pulmonary Disease

## 2016-05-27 DIAGNOSIS — J449 Chronic obstructive pulmonary disease, unspecified: Secondary | ICD-10-CM | POA: Diagnosis not present

## 2016-06-23 DIAGNOSIS — J449 Chronic obstructive pulmonary disease, unspecified: Secondary | ICD-10-CM | POA: Diagnosis not present

## 2016-07-11 ENCOUNTER — Other Ambulatory Visit: Payer: Self-pay | Admitting: Pulmonary Disease

## 2016-07-26 ENCOUNTER — Telehealth: Payer: Self-pay | Admitting: Pulmonary Disease

## 2016-07-26 MED ORDER — BUDESONIDE-FORMOTEROL FUMARATE 160-4.5 MCG/ACT IN AERO: 2.0000 | INHALATION_SPRAY | Freq: Two times a day (BID) | RESPIRATORY_TRACT | 5 refills | Status: DC

## 2016-07-26 NOTE — Telephone Encounter (Signed)
Pt requesting rx refill on symbicort.  This has been sent to preferred pharmacy.  Nothing further needed.

## 2016-07-28 DIAGNOSIS — N8110 Cystocele, unspecified: Secondary | ICD-10-CM | POA: Diagnosis not present

## 2016-07-28 DIAGNOSIS — R350 Frequency of micturition: Secondary | ICD-10-CM | POA: Diagnosis not present

## 2016-07-28 DIAGNOSIS — N816 Rectocele: Secondary | ICD-10-CM | POA: Diagnosis not present

## 2016-08-02 DIAGNOSIS — J449 Chronic obstructive pulmonary disease, unspecified: Secondary | ICD-10-CM | POA: Diagnosis not present

## 2016-08-03 DIAGNOSIS — R05 Cough: Secondary | ICD-10-CM | POA: Diagnosis not present

## 2016-08-03 DIAGNOSIS — J449 Chronic obstructive pulmonary disease, unspecified: Secondary | ICD-10-CM | POA: Diagnosis not present

## 2016-08-03 DIAGNOSIS — R062 Wheezing: Secondary | ICD-10-CM | POA: Diagnosis not present

## 2016-08-03 DIAGNOSIS — E785 Hyperlipidemia, unspecified: Secondary | ICD-10-CM | POA: Diagnosis not present

## 2016-08-17 ENCOUNTER — Ambulatory Visit (INDEPENDENT_AMBULATORY_CARE_PROVIDER_SITE_OTHER)
Admission: RE | Admit: 2016-08-17 | Discharge: 2016-08-17 | Disposition: A | Discharge status: Home / Self Care | Payer: Medicare Other | Source: Ambulatory Visit | Attending: Acute Care | Admitting: Acute Care

## 2016-08-17 ENCOUNTER — Other Ambulatory Visit: Payer: Self-pay | Admitting: Pulmonary Disease

## 2016-08-17 DIAGNOSIS — Z87891 Personal history of nicotine dependence: Secondary | ICD-10-CM

## 2016-08-21 DIAGNOSIS — J449 Chronic obstructive pulmonary disease, unspecified: Secondary | ICD-10-CM | POA: Diagnosis not present

## 2016-08-23 DIAGNOSIS — R7303 Prediabetes: Secondary | ICD-10-CM | POA: Diagnosis not present

## 2016-08-23 DIAGNOSIS — E785 Hyperlipidemia, unspecified: Secondary | ICD-10-CM | POA: Diagnosis not present

## 2016-08-23 DIAGNOSIS — Z720 Tobacco use: Secondary | ICD-10-CM | POA: Diagnosis not present

## 2016-08-23 DIAGNOSIS — J449 Chronic obstructive pulmonary disease, unspecified: Secondary | ICD-10-CM | POA: Diagnosis not present

## 2016-08-31 ENCOUNTER — Other Ambulatory Visit: Payer: Self-pay | Admitting: Acute Care

## 2016-08-31 DIAGNOSIS — Z87891 Personal history of nicotine dependence: Secondary | ICD-10-CM

## 2016-09-15 ENCOUNTER — Other Ambulatory Visit: Payer: Self-pay | Admitting: Pulmonary Disease

## 2016-09-16 DIAGNOSIS — Z720 Tobacco use: Secondary | ICD-10-CM | POA: Diagnosis not present

## 2016-09-16 DIAGNOSIS — E785 Hyperlipidemia, unspecified: Secondary | ICD-10-CM | POA: Diagnosis not present

## 2016-09-16 DIAGNOSIS — J449 Chronic obstructive pulmonary disease, unspecified: Secondary | ICD-10-CM | POA: Diagnosis not present

## 2016-10-03 DIAGNOSIS — J449 Chronic obstructive pulmonary disease, unspecified: Secondary | ICD-10-CM | POA: Diagnosis not present

## 2016-10-27 DIAGNOSIS — J449 Chronic obstructive pulmonary disease, unspecified: Secondary | ICD-10-CM | POA: Diagnosis not present

## 2016-10-27 DIAGNOSIS — Z0189 Encounter for other specified special examinations: Secondary | ICD-10-CM | POA: Diagnosis not present

## 2016-10-27 DIAGNOSIS — I2510 Atherosclerotic heart disease of native coronary artery without angina pectoris: Secondary | ICD-10-CM | POA: Diagnosis not present

## 2016-10-27 DIAGNOSIS — R0602 Shortness of breath: Secondary | ICD-10-CM | POA: Diagnosis not present

## 2016-11-14 DIAGNOSIS — R0602 Shortness of breath: Secondary | ICD-10-CM | POA: Diagnosis not present

## 2016-11-14 DIAGNOSIS — R918 Other nonspecific abnormal finding of lung field: Secondary | ICD-10-CM | POA: Diagnosis not present

## 2016-11-17 DIAGNOSIS — R0602 Shortness of breath: Secondary | ICD-10-CM | POA: Diagnosis not present

## 2016-11-20 ENCOUNTER — Other Ambulatory Visit: Payer: Self-pay | Admitting: Pulmonary Disease

## 2016-11-20 DIAGNOSIS — J449 Chronic obstructive pulmonary disease, unspecified: Secondary | ICD-10-CM | POA: Diagnosis not present

## 2016-12-01 DIAGNOSIS — J449 Chronic obstructive pulmonary disease, unspecified: Secondary | ICD-10-CM | POA: Diagnosis not present

## 2016-12-01 DIAGNOSIS — I2510 Atherosclerotic heart disease of native coronary artery without angina pectoris: Secondary | ICD-10-CM | POA: Diagnosis not present

## 2016-12-01 DIAGNOSIS — R0602 Shortness of breath: Secondary | ICD-10-CM | POA: Diagnosis not present

## 2016-12-01 DIAGNOSIS — Z0189 Encounter for other specified special examinations: Secondary | ICD-10-CM | POA: Diagnosis not present

## 2016-12-05 DIAGNOSIS — J449 Chronic obstructive pulmonary disease, unspecified: Secondary | ICD-10-CM | POA: Diagnosis not present

## 2016-12-07 DIAGNOSIS — R0602 Shortness of breath: Secondary | ICD-10-CM | POA: Diagnosis not present

## 2016-12-15 ENCOUNTER — Other Ambulatory Visit: Payer: Self-pay | Admitting: Pulmonary Disease

## 2016-12-17 ENCOUNTER — Other Ambulatory Visit: Payer: Self-pay | Admitting: Pulmonary Disease

## 2016-12-21 DIAGNOSIS — J449 Chronic obstructive pulmonary disease, unspecified: Secondary | ICD-10-CM | POA: Diagnosis not present

## 2016-12-28 DIAGNOSIS — Z720 Tobacco use: Secondary | ICD-10-CM | POA: Diagnosis not present

## 2016-12-28 DIAGNOSIS — J449 Chronic obstructive pulmonary disease, unspecified: Secondary | ICD-10-CM | POA: Diagnosis not present

## 2017-01-21 DIAGNOSIS — J449 Chronic obstructive pulmonary disease, unspecified: Secondary | ICD-10-CM | POA: Diagnosis not present

## 2017-02-02 ENCOUNTER — Encounter: Payer: Self-pay | Admitting: Pulmonary Disease

## 2017-02-02 ENCOUNTER — Ambulatory Visit (INDEPENDENT_AMBULATORY_CARE_PROVIDER_SITE_OTHER): Payer: BLUE CROSS/BLUE SHIELD | Admitting: Pulmonary Disease

## 2017-02-02 VITALS — BP 140/80 | HR 63 | Ht 63.0 in | Wt 176.4 lb

## 2017-02-02 DIAGNOSIS — J9611 Chronic respiratory failure with hypoxia: Secondary | ICD-10-CM

## 2017-02-02 DIAGNOSIS — J449 Chronic obstructive pulmonary disease, unspecified: Secondary | ICD-10-CM | POA: Diagnosis not present

## 2017-02-02 DIAGNOSIS — K219 Gastro-esophageal reflux disease without esophagitis: Secondary | ICD-10-CM | POA: Diagnosis not present

## 2017-02-02 MED ORDER — FORMOTEROL FUMARATE 20 MCG/2ML IN NEBU: 20.0000 ug | INHALATION_SOLUTION | Freq: Two times a day (BID) | RESPIRATORY_TRACT | 3 refills | Status: DC

## 2017-02-02 MED ORDER — BUDESONIDE 0.5 MG/2ML IN SUSP: 0.5000 mg | Freq: Two times a day (BID) | RESPIRATORY_TRACT | 3 refills | Status: DC

## 2017-02-02 NOTE — Patient Instructions (Addendum)
   Continue using your Spiriva at the same time every day as prescribed.  Stop using the Trelegy inhaler you have.  I am starting you on Perforomist and Pulmicort to use in your nebulizer twice daily in place of your Symbicort.  Hold onto your Symbicort. If your breathing is worse on the Pulmicort and Perforomist go back to using your Symbicort.  You can continue to use your ProAir inhaler and Albuterol in your nebulizer as needed.  Keep taking all your medications as prescribed.  Call us if you have any new breathing problems or questions before your next appointment.   TESTS ORDERED: 1. Spirometry with bronchodilator challenge at next appointment 2. 6MWT on room air at next appointment
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
## 2017-02-03 DIAGNOSIS — J449 Chronic obstructive pulmonary disease, unspecified: Secondary | ICD-10-CM | POA: Diagnosis not present

## 2017-02-13 ENCOUNTER — Other Ambulatory Visit: Payer: Self-pay | Admitting: Pulmonary Disease

## 2017-02-20 DIAGNOSIS — J449 Chronic obstructive pulmonary disease, unspecified: Secondary | ICD-10-CM | POA: Diagnosis not present

## 2017-03-11 ENCOUNTER — Other Ambulatory Visit: Payer: Self-pay | Admitting: Pulmonary Disease

## 2017-03-12 ENCOUNTER — Other Ambulatory Visit: Payer: Self-pay | Admitting: Pulmonary Disease

## 2017-03-16 ENCOUNTER — Ambulatory Visit (INDEPENDENT_AMBULATORY_CARE_PROVIDER_SITE_OTHER): Payer: BLUE CROSS/BLUE SHIELD | Admitting: Adult Health

## 2017-03-16 ENCOUNTER — Encounter: Payer: Self-pay | Admitting: Adult Health

## 2017-03-16 DIAGNOSIS — G4734 Idiopathic sleep related nonobstructive alveolar hypoventilation: Secondary | ICD-10-CM | POA: Diagnosis not present

## 2017-03-16 DIAGNOSIS — J449 Chronic obstructive pulmonary disease, unspecified: Secondary | ICD-10-CM | POA: Diagnosis not present

## 2017-03-16 MED ORDER — BUDESONIDE-FORMOTEROL FUMARATE 160-4.5 MCG/ACT IN AERO: 2.0000 | INHALATION_SPRAY | Freq: Two times a day (BID) | RESPIRATORY_TRACT | 0 refills | Status: DC

## 2017-03-16 NOTE — Assessment & Plan Note (Signed)
Cont on O2 At bedtime   

## 2017-03-16 NOTE — Assessment & Plan Note (Signed)
Compensated on present regimen   Plan  Patient Instructions  Continue on Symbicort and Spiriva . Rinse after use.  Continue on oxygen 3l/m At bedtime  .  Follow up with Dr. [name] in 3-4 months and As needed   1. Spirometry with bronchodilator challenge at next appointment 2. 6MWT on room air at next appointment

## 2017-03-16 NOTE — Patient Instructions (Addendum)
Continue on Symbicort and Spiriva . Rinse after use.  Continue on oxygen 3l/m At bedtime  .  Follow up with Dr. [name] in 3-4 months and As needed   1. Spirometry with bronchodilator challenge at next appointment 2. 6MWT on room air at next appointment
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
## 2017-03-23 DIAGNOSIS — J449 Chronic obstructive pulmonary disease, unspecified: Secondary | ICD-10-CM | POA: Diagnosis not present

## 2017-04-14 ENCOUNTER — Other Ambulatory Visit: Payer: Self-pay | Admitting: Pulmonary Disease

## 2017-04-17 ENCOUNTER — Telehealth: Payer: Self-pay | Admitting: Pulmonary Disease

## 2017-04-17 MED ORDER — BUDESONIDE-FORMOTEROL FUMARATE 160-4.5 MCG/ACT IN AERO: 2.0000 | INHALATION_SPRAY | Freq: Two times a day (BID) | RESPIRATORY_TRACT | 2 refills | Status: DC

## 2017-04-17 NOTE — Telephone Encounter (Signed)
Rx for Symbicort has been sent to preferred pharmacy.\ Pt is aware and voiced her understanding. Nothing further needed.      Return in about 3 months (around 06/16/2017) for Follow up with Dr. [name].  Continue on Symbicort and Spiriva . Rinse after use.  Continue on oxygen 3l/m At bedtime  .  Follow up with Dr. [name] in 3-4 months and As needed   1. Spirometry with bronchodilator challenge at next appointment 2. 6MWT on room air at next appointment

## 2017-04-18 ENCOUNTER — Telehealth: Payer: Self-pay | Admitting: Pulmonary Disease

## 2017-04-18 DIAGNOSIS — J449 Chronic obstructive pulmonary disease, unspecified: Secondary | ICD-10-CM | POA: Diagnosis not present

## 2017-04-18 MED ORDER — BUDESONIDE-FORMOTEROL FUMARATE 160-4.5 MCG/ACT IN AERO: 2.0000 | INHALATION_SPRAY | Freq: Two times a day (BID) | RESPIRATORY_TRACT | 5 refills | Status: DC

## 2017-04-18 NOTE — Telephone Encounter (Signed)
Spoke with pt who stated an Rx of Symbicort was supposed to have been sent in yesterday, 04/17/17. Told pt I did not see where one was sent in but I would take care of sending a Rx to her preferred pharmacy. Pt expressed understanding. Nothing further needed.

## 2017-04-22 DIAGNOSIS — J449 Chronic obstructive pulmonary disease, unspecified: Secondary | ICD-10-CM | POA: Diagnosis not present

## 2017-05-03 DIAGNOSIS — Z720 Tobacco use: Secondary | ICD-10-CM | POA: Diagnosis not present

## 2017-05-03 DIAGNOSIS — Z0000 Encounter for general adult medical examination without abnormal findings: Secondary | ICD-10-CM | POA: Diagnosis not present

## 2017-05-03 DIAGNOSIS — Z131 Encounter for screening for diabetes mellitus: Secondary | ICD-10-CM | POA: Diagnosis not present

## 2017-05-03 DIAGNOSIS — J449 Chronic obstructive pulmonary disease, unspecified: Secondary | ICD-10-CM | POA: Diagnosis not present

## 2017-05-03 DIAGNOSIS — E785 Hyperlipidemia, unspecified: Secondary | ICD-10-CM | POA: Diagnosis not present

## 2017-05-03 DIAGNOSIS — R7303 Prediabetes: Secondary | ICD-10-CM | POA: Diagnosis not present

## 2017-05-23 DIAGNOSIS — J449 Chronic obstructive pulmonary disease, unspecified: Secondary | ICD-10-CM | POA: Diagnosis not present

## 2017-06-01 ENCOUNTER — Other Ambulatory Visit: Payer: Self-pay | Admitting: Pulmonary Disease

## 2017-06-01 ENCOUNTER — Telehealth: Payer: Self-pay | Admitting: Pulmonary Disease

## 2017-06-01 DIAGNOSIS — E785 Hyperlipidemia, unspecified: Secondary | ICD-10-CM | POA: Diagnosis not present

## 2017-06-01 DIAGNOSIS — J449 Chronic obstructive pulmonary disease, unspecified: Secondary | ICD-10-CM | POA: Diagnosis not present

## 2017-06-01 DIAGNOSIS — I2510 Atherosclerotic heart disease of native coronary artery without angina pectoris: Secondary | ICD-10-CM | POA: Diagnosis not present

## 2017-06-01 DIAGNOSIS — I209 Angina pectoris, unspecified: Secondary | ICD-10-CM | POA: Diagnosis not present

## 2017-06-01 DIAGNOSIS — R0600 Dyspnea, unspecified: Secondary | ICD-10-CM

## 2017-06-01 NOTE — Telephone Encounter (Signed)
Spoke with patient Scheduled PFT needing prior to OV 06/06/17 Nothing further needed

## 2017-06-02 ENCOUNTER — Ambulatory Visit (INDEPENDENT_AMBULATORY_CARE_PROVIDER_SITE_OTHER): Payer: BLUE CROSS/BLUE SHIELD | Admitting: Pulmonary Disease

## 2017-06-02 DIAGNOSIS — R0600 Dyspnea, unspecified: Secondary | ICD-10-CM | POA: Diagnosis not present

## 2017-06-02 LAB — PULMONARY FUNCTION TEST
DL/VA % PRED: 50 %
DL/VA: 2.32 ml/min/mmHg/L
DLCO UNC: 7.12 ml/min/mmHg
DLCO cor % pred: 32 %
DLCO cor: 7.02 ml/min/mmHg
DLCO unc % pred: 32 %
FEF 25-75 Post: 0.45 L/sec
FEF 25-75 Pre: 0.26 L/sec
FEF2575-%Change-Post: 72 %
FEF2575-%PRED-POST: 22 %
FEF2575-%Pred-Pre: 13 %
FEV1-%CHANGE-POST: 20 %
FEV1-%PRED-POST: 40 %
FEV1-%Pred-Pre: 33 %
FEV1-PRE: 0.65 L
FEV1-Post: 0.79 L
FEV1FVC-%Change-Post: 2 %
FEV1FVC-%Pred-Pre: 64 %
FEV6-%Change-Post: 19 %
FEV6-%PRED-POST: 60 %
FEV6-%Pred-Pre: 51 %
FEV6-POST: 1.46 L
FEV6-PRE: 1.23 L
FEV6FVC-%CHANGE-POST: 2 %
FEV6FVC-%PRED-POST: 103 %
FEV6FVC-%PRED-PRE: 100 %
FVC-%CHANGE-POST: 17 %
FVC-%PRED-POST: 60 %
FVC-%Pred-Pre: 51 %
FVC-POST: 1.5 L
FVC-Pre: 1.27 L
POST FEV1/FVC RATIO: 53 %
PRE FEV6/FVC RATIO: 97 %
Post FEV6/FVC ratio: 99 %
Pre FEV1/FVC ratio: 51 %
RV % pred: 160 %
RV: 3.01 L
TLC % pred: 96 %
TLC: 4.62 L

## 2017-06-02 NOTE — Progress Notes (Signed)
PFT done today. 

## 2017-06-06 ENCOUNTER — Encounter: Payer: Self-pay | Admitting: Pulmonary Disease

## 2017-06-06 ENCOUNTER — Ambulatory Visit (INDEPENDENT_AMBULATORY_CARE_PROVIDER_SITE_OTHER): Payer: BLUE CROSS/BLUE SHIELD | Admitting: *Deleted

## 2017-06-06 ENCOUNTER — Ambulatory Visit (INDEPENDENT_AMBULATORY_CARE_PROVIDER_SITE_OTHER): Payer: BLUE CROSS/BLUE SHIELD | Admitting: Pulmonary Disease

## 2017-06-06 ENCOUNTER — Ambulatory Visit (INDEPENDENT_AMBULATORY_CARE_PROVIDER_SITE_OTHER)
Admission: RE | Admit: 2017-06-06 | Discharge: 2017-06-06 | Disposition: A | Discharge status: Home / Self Care | Payer: Medicare Other | Source: Ambulatory Visit | Attending: Pulmonary Disease | Admitting: Pulmonary Disease

## 2017-06-06 VITALS — BP 126/74 | HR 83 | Ht 63.0 in | Wt 177.0 lb

## 2017-06-06 DIAGNOSIS — J449 Chronic obstructive pulmonary disease, unspecified: Secondary | ICD-10-CM | POA: Diagnosis not present

## 2017-06-06 DIAGNOSIS — R0602 Shortness of breath: Secondary | ICD-10-CM

## 2017-06-06 DIAGNOSIS — R0600 Dyspnea, unspecified: Secondary | ICD-10-CM

## 2017-06-06 MED ORDER — PREDNISONE 10 MG PO TABS: ORAL_TABLET | ORAL | 0 refills | Status: DC

## 2017-06-06 NOTE — Patient Instructions (Signed)
We will get a chest x-ray today We will start you on a prednisone taper starting at 40 mg.  Reduce dose by 10 mg every 3 days Follow-up in 3 months.

## 2017-06-06 NOTE — Progress Notes (Signed)
SIX MIN WALK 06/06/2017 11/10/2015 02/15/2011 01/19/2011  Medications Proventil neb solution, ProAir rescue inhaler and Spiriva handihaler were all taken this morning around 9am.  No meds taken this morning - -  Supplimental Oxygen during Test? (L/min) Yes No - No  O2 Flow Rate 2 - - -  Type Continuous - - -  Laps 4 7 - -  Partial Lap (in Meters) 27 24 - -  Baseline BP (sitting) 124/72 124/80 - -  Baseline Heartrate 99 74 - -  Baseline Dyspnea (Borg Scale) 7 0 - -  Baseline Fatigue (Borg Scale) 7 2 - -  Baseline SPO2 93 96 - -  BP (sitting) 126/88 138/92 - -  Heartrate 105 100 - -  Dyspnea (Borg Scale) 10 4 - -  Fatigue (Borg Scale) 9 3 - -  SPO2 92 95 - -  BP (sitting) 126/84 130/90 - -  Heartrate 95 81 - -  SPO2 99 96 - -  Stopped or Paused before Six Minutes Yes Yes - -  Other Symptoms at end of Exercise Patient stopped with 4:25 left due to increased wheezing and SOB. Was placed on 2L. O2 increased to 94%. Patient stopped again with 2:03 left due to SOB. O2 had dropped to 87%. O2 increased to 3L, O2 increased to 94%.  Pt paused d/t SOB with 3:60mns left on the timer - patient paused for a total of 21 seconds - -  Distance Completed 219 360 - -  Tech Comments: Patient stopped with 4:25 left due to increased wheezing and SOB. Was placed on 2L. O2 increased to 94%. Patient stopped again with 2:03 left due to SOB. O2 had dropped to 87%. O2 increased to 3L, O2 increased to 94%. At the end of the walk, patient refused to wear O2 while waiting in lobby.  Pt completed walk with little complaints. Pt c/o worsening SOB after 6MW completed - sats were in mid 90's, no desat during the test.  started pt on 2 LPM cont of oxygen.  O2 sats increased to 93% while ambulating.  test stopped d/t decrease in o2 sats/  MMR, LPN
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
## 2017-06-08 DIAGNOSIS — J449 Chronic obstructive pulmonary disease, unspecified: Secondary | ICD-10-CM | POA: Diagnosis not present

## 2017-06-08 DIAGNOSIS — Z720 Tobacco use: Secondary | ICD-10-CM | POA: Diagnosis not present

## 2017-06-08 DIAGNOSIS — R7303 Prediabetes: Secondary | ICD-10-CM | POA: Diagnosis not present

## 2017-06-12 DIAGNOSIS — J449 Chronic obstructive pulmonary disease, unspecified: Secondary | ICD-10-CM | POA: Diagnosis not present

## 2017-06-23 DIAGNOSIS — J449 Chronic obstructive pulmonary disease, unspecified: Secondary | ICD-10-CM | POA: Diagnosis not present

## 2017-07-06 DIAGNOSIS — J449 Chronic obstructive pulmonary disease, unspecified: Secondary | ICD-10-CM | POA: Diagnosis not present

## 2017-07-06 DIAGNOSIS — E559 Vitamin D deficiency, unspecified: Secondary | ICD-10-CM | POA: Diagnosis not present

## 2017-07-06 DIAGNOSIS — R7303 Prediabetes: Secondary | ICD-10-CM | POA: Diagnosis not present

## 2017-07-06 DIAGNOSIS — E785 Hyperlipidemia, unspecified: Secondary | ICD-10-CM | POA: Diagnosis not present

## 2017-07-12 DIAGNOSIS — L03012 Cellulitis of left finger: Secondary | ICD-10-CM | POA: Diagnosis not present

## 2017-07-17 DIAGNOSIS — J449 Chronic obstructive pulmonary disease, unspecified: Secondary | ICD-10-CM | POA: Diagnosis not present

## 2017-07-19 ENCOUNTER — Other Ambulatory Visit: Payer: Self-pay

## 2017-07-19 DIAGNOSIS — J449 Chronic obstructive pulmonary disease, unspecified: Secondary | ICD-10-CM | POA: Diagnosis not present

## 2017-07-19 MED ORDER — ALBUTEROL SULFATE HFA 108 (90 BASE) MCG/ACT IN AERS: 2.0000 | INHALATION_SPRAY | Freq: Four times a day (QID) | RESPIRATORY_TRACT | 3 refills | Status: DC | PRN

## 2017-07-19 NOTE — Telephone Encounter (Signed)
Received refill request for Proair from [facility]. [facility] pt with pending apt with Dr. [name] on 08/10/17. Rx for [name] has been sent to preferred pharmacy. Nothing further is needed.

## 2017-07-21 DIAGNOSIS — J449 Chronic obstructive pulmonary disease, unspecified: Secondary | ICD-10-CM | POA: Diagnosis not present

## 2017-07-27 DIAGNOSIS — J449 Chronic obstructive pulmonary disease, unspecified: Secondary | ICD-10-CM | POA: Diagnosis not present

## 2017-07-27 DIAGNOSIS — E559 Vitamin D deficiency, unspecified: Secondary | ICD-10-CM | POA: Diagnosis not present

## 2017-07-27 DIAGNOSIS — Z0110 Encounter for examination of ears and hearing without abnormal findings: Secondary | ICD-10-CM | POA: Diagnosis not present

## 2017-07-27 DIAGNOSIS — R7303 Prediabetes: Secondary | ICD-10-CM | POA: Diagnosis not present

## 2017-07-27 DIAGNOSIS — Z136 Encounter for screening for cardiovascular disorders: Secondary | ICD-10-CM | POA: Diagnosis not present

## 2017-07-27 DIAGNOSIS — Z0000 Encounter for general adult medical examination without abnormal findings: Secondary | ICD-10-CM | POA: Diagnosis not present

## 2017-07-27 DIAGNOSIS — E785 Hyperlipidemia, unspecified: Secondary | ICD-10-CM | POA: Diagnosis not present

## 2017-08-02 DIAGNOSIS — J449 Chronic obstructive pulmonary disease, unspecified: Secondary | ICD-10-CM | POA: Diagnosis not present

## 2017-08-08 DIAGNOSIS — J449 Chronic obstructive pulmonary disease, unspecified: Secondary | ICD-10-CM | POA: Diagnosis not present

## 2017-08-10 ENCOUNTER — Ambulatory Visit (INDEPENDENT_AMBULATORY_CARE_PROVIDER_SITE_OTHER): Payer: BLUE CROSS/BLUE SHIELD | Admitting: Pulmonary Disease

## 2017-08-10 ENCOUNTER — Encounter: Payer: Self-pay | Admitting: Pulmonary Disease

## 2017-08-10 VITALS — BP 126/76 | HR 144 | Ht 63.0 in | Wt 177.4 lb

## 2017-08-10 DIAGNOSIS — J449 Chronic obstructive pulmonary disease, unspecified: Secondary | ICD-10-CM

## 2017-08-10 MED ORDER — PREDNISONE 10 MG PO TABS: ORAL_TABLET | ORAL | 0 refills | Status: DC

## 2017-08-10 MED ORDER — FLUTICASONE-UMECLIDIN-VILANT 100-62.5-25 MCG/INH IN AEPB: 1.0000 | INHALATION_SPRAY | Freq: Every day | RESPIRATORY_TRACT | 5 refills | Status: DC

## 2017-08-10 MED ORDER — FLUTICASONE-UMECLIDIN-VILANT 100-62.5-25 MCG/INH IN AEPB: 1.0000 | INHALATION_SPRAY | Freq: Every day | RESPIRATORY_TRACT | 0 refills | Status: AC
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
## 2017-08-10 NOTE — Patient Instructions (Signed)
Stop the Symbicort and Spiriva.  We will start you on trelegy We will give you another prednisone taper starting at 40 mg.  Reduce dose by 10 mg every 3 days Will see if you can get a portable concentrator so that you can continue to work and use oxygen during the daytime Continue oxygen at night Follow-up in 3 months.

## 2017-08-21 DIAGNOSIS — J449 Chronic obstructive pulmonary disease, unspecified: Secondary | ICD-10-CM | POA: Diagnosis not present

## 2017-08-22 ENCOUNTER — Encounter: Payer: Self-pay | Admitting: Cardiology

## 2017-08-22 DIAGNOSIS — R0602 Shortness of breath: Secondary | ICD-10-CM | POA: Diagnosis not present

## 2017-08-22 DIAGNOSIS — I209 Angina pectoris, unspecified: Secondary | ICD-10-CM | POA: Diagnosis not present

## 2017-08-22 DIAGNOSIS — E785 Hyperlipidemia, unspecified: Secondary | ICD-10-CM | POA: Diagnosis not present

## 2017-08-22 DIAGNOSIS — I2510 Atherosclerotic heart disease of native coronary artery without angina pectoris: Secondary | ICD-10-CM | POA: Diagnosis not present

## 2017-08-22 DIAGNOSIS — I25118 Atherosclerotic heart disease of native coronary artery with other forms of angina pectoris: Secondary | ICD-10-CM

## 2017-09-05 ENCOUNTER — Telehealth: Payer: Self-pay | Admitting: Pulmonary Disease

## 2017-09-05 ENCOUNTER — Ambulatory Visit (INDEPENDENT_AMBULATORY_CARE_PROVIDER_SITE_OTHER)
Admission: RE | Admit: 2017-09-05 | Discharge: 2017-09-05 | Disposition: A | Discharge status: Home / Self Care | Payer: BLUE CROSS/BLUE SHIELD | Source: Ambulatory Visit | Attending: Acute Care | Admitting: Acute Care

## 2017-09-05 DIAGNOSIS — Z87891 Personal history of nicotine dependence: Secondary | ICD-10-CM

## 2017-09-05 NOTE — Telephone Encounter (Signed)
Spoke with pt, could not tell me who called her- states "this number called me so I called back".   I could not find any reason why our office would have called her.  Pt expressed understanding.  Will close encounter.

## 2017-09-10 DIAGNOSIS — J440 Chronic obstructive pulmonary disease with acute lower respiratory infection: Secondary | ICD-10-CM | POA: Diagnosis not present

## 2017-09-12 ENCOUNTER — Other Ambulatory Visit: Payer: Self-pay | Admitting: Acute Care

## 2017-09-12 DIAGNOSIS — Z87891 Personal history of nicotine dependence: Secondary | ICD-10-CM

## 2017-09-12 DIAGNOSIS — Z122 Encounter for screening for malignant neoplasm of respiratory organs: Secondary | ICD-10-CM

## 2017-09-15 ENCOUNTER — Other Ambulatory Visit: Payer: Self-pay

## 2017-09-15 MED ORDER — BUDESONIDE-FORMOTEROL FUMARATE 160-4.5 MCG/ACT IN AERO: 2.0000 | INHALATION_SPRAY | Freq: Two times a day (BID) | RESPIRATORY_TRACT | 5 refills | Status: DC

## 2017-09-20 DIAGNOSIS — J449 Chronic obstructive pulmonary disease, unspecified: Secondary | ICD-10-CM | POA: Diagnosis not present

## 2017-10-11 DIAGNOSIS — J449 Chronic obstructive pulmonary disease, unspecified: Secondary | ICD-10-CM | POA: Diagnosis not present

## 2017-10-14 ENCOUNTER — Other Ambulatory Visit: Payer: Self-pay | Admitting: Pulmonary Disease

## 2017-10-21 ENCOUNTER — Other Ambulatory Visit: Payer: Self-pay | Admitting: Pulmonary Disease

## 2017-10-21 DIAGNOSIS — J449 Chronic obstructive pulmonary disease, unspecified: Secondary | ICD-10-CM | POA: Diagnosis not present

## 2017-10-26 ENCOUNTER — Other Ambulatory Visit: Payer: Self-pay | Admitting: Pulmonary Disease

## 2017-10-26 MED ORDER — MONTELUKAST SODIUM 10 MG PO TABS: ORAL_TABLET | ORAL | 2 refills | Status: DC

## 2017-11-10 ENCOUNTER — Ambulatory Visit: Payer: Medicare Other | Admitting: Pulmonary Disease

## 2017-11-12 ENCOUNTER — Other Ambulatory Visit: Payer: Self-pay | Admitting: Pulmonary Disease

## 2017-11-14 DIAGNOSIS — E785 Hyperlipidemia, unspecified: Secondary | ICD-10-CM | POA: Diagnosis not present

## 2017-11-14 DIAGNOSIS — J449 Chronic obstructive pulmonary disease, unspecified: Secondary | ICD-10-CM | POA: Diagnosis not present

## 2017-11-14 DIAGNOSIS — R7303 Prediabetes: Secondary | ICD-10-CM | POA: Diagnosis not present

## 2017-11-20 ENCOUNTER — Ambulatory Visit (INDEPENDENT_AMBULATORY_CARE_PROVIDER_SITE_OTHER): Payer: BLUE CROSS/BLUE SHIELD | Admitting: Pulmonary Disease

## 2017-11-20 ENCOUNTER — Encounter: Payer: Self-pay | Admitting: Pulmonary Disease

## 2017-11-20 VITALS — BP 112/60 | HR 80 | Ht 63.0 in | Wt 176.0 lb

## 2017-11-20 DIAGNOSIS — J449 Chronic obstructive pulmonary disease, unspecified: Secondary | ICD-10-CM | POA: Diagnosis not present

## 2017-11-20 NOTE — Patient Instructions (Addendum)
I am glad you are doing better with the new inhaler Continue trelegy Continue albuterol as needed, supplement oxygen Follow-up in 6 months
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
## 2017-12-18 DIAGNOSIS — J449 Chronic obstructive pulmonary disease, unspecified: Secondary | ICD-10-CM | POA: Diagnosis not present

## 2017-12-18 DIAGNOSIS — K120 Recurrent oral aphthae: Secondary | ICD-10-CM | POA: Diagnosis not present

## 2017-12-18 DIAGNOSIS — R7303 Prediabetes: Secondary | ICD-10-CM | POA: Diagnosis not present

## 2017-12-19 ENCOUNTER — Other Ambulatory Visit: Payer: Self-pay

## 2017-12-19 MED ORDER — RANITIDINE HCL 150 MG PO TABS: ORAL_TABLET | ORAL | 2 refills | Status: DC

## 2017-12-21 DIAGNOSIS — J449 Chronic obstructive pulmonary disease, unspecified: Secondary | ICD-10-CM | POA: Diagnosis not present

## 2018-01-10 DIAGNOSIS — R0602 Shortness of breath: Secondary | ICD-10-CM | POA: Diagnosis not present

## 2018-01-10 DIAGNOSIS — R05 Cough: Secondary | ICD-10-CM | POA: Diagnosis not present

## 2018-01-21 DIAGNOSIS — J449 Chronic obstructive pulmonary disease, unspecified: Secondary | ICD-10-CM | POA: Diagnosis not present

## 2018-02-13 ENCOUNTER — Other Ambulatory Visit: Payer: Self-pay | Admitting: Pulmonary Disease

## 2018-02-19 DIAGNOSIS — E669 Obesity, unspecified: Secondary | ICD-10-CM | POA: Insufficient documentation

## 2018-02-19 DIAGNOSIS — I2510 Atherosclerotic heart disease of native coronary artery without angina pectoris: Secondary | ICD-10-CM | POA: Insufficient documentation

## 2018-02-19 DIAGNOSIS — J9610 Chronic respiratory failure, unspecified whether with hypoxia or hypercapnia: Secondary | ICD-10-CM | POA: Insufficient documentation

## 2018-02-19 DIAGNOSIS — E785 Hyperlipidemia, unspecified: Secondary | ICD-10-CM | POA: Insufficient documentation

## 2018-02-19 HISTORY — DX: Atherosclerotic heart disease of native coronary artery without angina pectoris: I25.10

## 2018-02-19 HISTORY — DX: Obesity, unspecified: E66.9

## 2018-02-19 HISTORY — DX: Hyperlipidemia, unspecified: E78.5

## 2018-02-20 DIAGNOSIS — J449 Chronic obstructive pulmonary disease, unspecified: Secondary | ICD-10-CM | POA: Diagnosis not present

## 2018-02-22 DIAGNOSIS — E785 Hyperlipidemia, unspecified: Secondary | ICD-10-CM | POA: Diagnosis not present

## 2018-02-22 DIAGNOSIS — E559 Vitamin D deficiency, unspecified: Secondary | ICD-10-CM | POA: Diagnosis not present

## 2018-02-22 DIAGNOSIS — R7303 Prediabetes: Secondary | ICD-10-CM | POA: Diagnosis not present

## 2018-02-22 DIAGNOSIS — Z87891 Personal history of nicotine dependence: Secondary | ICD-10-CM | POA: Diagnosis not present

## 2018-02-22 DIAGNOSIS — J449 Chronic obstructive pulmonary disease, unspecified: Secondary | ICD-10-CM | POA: Diagnosis not present

## 2018-02-27 NOTE — Consult Note (Signed)
[name]    Date of visit:  08/22/2017 DOB:  Oct 09, 1960    Age:  57 yrs. Medical record number:  [id number]     Account number:  [id number] Primary Care Provider: Benito Mccreedy ____________________________ CURRENT DIAGNOSES  1. CAD Native without angina  2. Hyperlipidemia  3. Dyspnea  4. Obesity  5. Chronic Obstructive Pulmonary Disease, Unspecified ____________________________ ALLERGIES  No Known Drug Allergies ____________________________ MEDICATIONS  1. albuterol sulfate 2.5 mg/3 mL (0.083 %) solution for nebulization, PRN  2. amlodipine 5 mg tablet, 1 p.o. daily  3. clopidogrel 75 mg tablet, 1 p.o. daily  4. montelukast 10 mg tablet, 1 p.o. daily  5. ProAir HFA 90 mcg/actuation aerosol inhaler, PRN  6. ranitidine 150 mg tablet, QHS  7. rosuvastatin 20 mg tablet, 1 p.o. daily  8. Trelegy Ellipta 100 mcg-62.5 mcg-25 mcg powder for inhalation, Daily ____________________________ HISTORY OF PRESENT ILLNESS  This 57 year old female is seen for evaluation of CAD. The patient previously was seen by Dr. Nadyne Coombes and she wishes to change doctors now. She has a long history of COPD and has been followed by pulmonary. On a CT scan done last year she was noted to have calcification on the left main and all 3 coronary arteries. She evidently is on oxygen at night and has recently been given a concentrator to wear at work. She has dyspnea on exertion. She had a myocardial perfusion scan done in July of 2018 showing an EF of 53% with a possible small focus of ischemia in the anteroapical region. There was also significant artifact present. An echocardiogram showed mild LVH and some mild pulmonary hypertension. Dr. Nadyne Coombes initially treated her with aspirin rosuvastatin and amlodipine. She evidently complained of shortness of breath when she would take aspirin and quit taking aspirin as well as rosuvastatin and is no longer taking amlodipine. On a subsequent visit with Dr. Nadyne Coombes she was noted to  have some mild chest discomfort although is difficult to tell whether she had angina or not. She continues to work the best I can tell and is not limited at work. She currently is on inhalers but is not currently taking any cardiovascular medications. She denies PND orthopnea or edema. ____________________________ PAST HISTORY  Past Medical Illnesses:  hyperlipidemia, COPD, hepatitis, obesity;  Cardiovascular Illnesses:  CAD, angina;  Infectious Diseases:  no previous history of significant infectious diseases;  Surgical Procedures:  hernia repair, removal of right breast mass;  Trauma History:  no previous history of significant trauma;  NYHA Classification:  I;  Canadian Angina Classification:  Class 0: Asymptomatic;  Cardiology Procedures-Invasive:  no previous interventional or invasive cardiology procedures;  Cardiology Procedures-Noninvasive:  no previous non-invasive cardiovascular testing;  Peripheral Vascular Procedures:  no previous invasive peripheral vascular procedures.;  LVEF not documented,   ____________________________ CARDIO-PULMONARY TEST DATES EKG Date:  08/22/2017;  Nuclear Study Date:  11/14/2016;  Echocardiography Date: 11/17/2016;   ____________________________ FAMILY HISTORY Brother -- Alive and well Brother -- Brother alive with problem, Congestive heart failure, Hypertension Brother -- Comptroller and well Father -- Father dead, Heart failure, Stroke, Hypertension Mother -- Mother alive with problem, Pulmonary emphysema Sister -- Comptroller and well Sister -- Alive and well Sister -- Alive and well ____________________________ SOCIAL HISTORY Alcohol Use:  does not use alcohol;  Smoking:  used to smoke but quit, 2011, 30 pack year history;  Diet:  regular diet;  Lifestyle:  single and 3 children;  Exercise:  no regular exercise;  Occupation:  Banker;  Residence:  lives with mother and son;   ____________________________ REVIEW OF SYSTEMS General:  obesity, weight gain   Integumentary:no rashes or new skin lesions. Eyes: wears eye glasses/contact lenses, denies diplopia, glaucoma or visual field defects. Ears, Nose, Throat, Mouth:  denies any hearing loss, epistaxis, hoarseness or difficulty speaking. Respiratory: history of COPD, dyspnea with exertion Cardiovascular:  please review HPI Abdominal: denies dyspepsia, GI bleeding, constipation, or diarrheaGenitourinary-Female: no dysuria, urgency, frequency, UTIs, or stress incontinence Musculoskeletal:  arthritis of the knees Neurological:  headaches Psychiatric:  denies depression or anxiety Hematological/Immunologic:  denies any food allergies, bleeding disorders. ____________________________ PHYSICAL EXAMINATION VITAL SIGNS  Blood Pressure:  114/88 Sitting, Left arm, large cuff  , 106/84 Standing, Left arm and large cuff   Pulse:  90/min. Weight:  177.00 lbs. Height:  63"BMI: 31  Constitutional:  pleasant African American female in no acute distress, moderately obese Skin:  warm and dry to touch, no apparent skin lesions, or masses noted. Head:  normocephalic, normal hair pattern, no masses or tenderness Eyes:  EOMS Intact, PERRLA, C and S clear, Funduscopic exam not done. ENT:  ears, nose and throat reveal no gross abnormalities.  Dentition good. Neck:  supple, without massess. No JVD, thyromegaly or carotid bruits. Carotid upstroke normal. Chest:  normal A-P diameter, expiratory wheezes Cardiac:  regular rhythm, normal S1 and S2, No S3 or S4, no murmurs, gallops or rubs detected. Abdomen:  abdomen soft,non-tender, no masses, no hepatospenomegaly, or aneurysm noted Peripheral Pulses:  the femoral,dorsalis pedis, and posterior tibial pulses are full and equal bilaterally with no bruits auscultated. Extremities & Back:  no deformities, clubbing, cyanosis, erythema or edema observed. Normal muscle strength and tone. Neurological:  no gross motor or sensory deficits noted, affect appropriate, oriented  x3. ____________________________ MOST RECENT LIPID PANEL 07/27/17  CHOL TOTL 231 mg/dl, LDL 123 NM, HDL 94 mg/dl, TRIGLYCER 71 mg/dl and VLDL 14 ____________________________ IMPRESSIONS/PLAN  1. Coronary artery disease with probable angina 2. Hyperlipidemia 3. Severe COPD with wheezing and oxygen dependent 4. Obesity 5. Aortic atherosclerosis  Recommendations:  Extensive old records were reviewed. The patient has a somewhat limited understanding of her medical conditions. We discussed coronary artery disease as well as atherosclerosis and rationale for treatment. She may have an allergy to aspirin because of her COPD that may be inducing wheezing. I recommended that she discontinue aspirin and that she begin Plavix 75 mg daily. I asked her to get back on rosuvastatin as well as amlodipine. In light of her COPD unless symptoms become more limiting in light of the low risk nuclear stress test I would continue to treat her medically. I will see her in 6 months.  Greater than 60 minutes spent including greater than 50% of the time spent face-to-face including coordination of care counseling regarding CAD, angina, her lipid treatment, and dealing with possible aspirin allergy.  ____________________________ TODAYS ORDERS  1. 12 Lead EKG: Today  2. 12 Lead EKG: 6 months  3. Return Visit: 6 months                       ____________________________ Cardiology Physician:  Kerry Hough MD [facility]

## 2018-03-01 ENCOUNTER — Encounter: Payer: Self-pay | Admitting: Cardiology

## 2018-03-01 ENCOUNTER — Ambulatory Visit (INDEPENDENT_AMBULATORY_CARE_PROVIDER_SITE_OTHER): Payer: BLUE CROSS/BLUE SHIELD | Admitting: Cardiology

## 2018-03-01 VITALS — BP 120/78 | HR 89 | Ht 63.0 in | Wt 180.0 lb

## 2018-03-01 DIAGNOSIS — J449 Chronic obstructive pulmonary disease, unspecified: Secondary | ICD-10-CM | POA: Diagnosis not present

## 2018-03-01 DIAGNOSIS — I25118 Atherosclerotic heart disease of native coronary artery with other forms of angina pectoris: Secondary | ICD-10-CM

## 2018-03-01 DIAGNOSIS — E7800 Pure hypercholesterolemia, unspecified: Secondary | ICD-10-CM

## 2018-03-01 MED ORDER — EZETIMIBE 10 MG PO TABS: 10.0000 mg | ORAL_TABLET | Freq: Every day | ORAL | 3 refills | Status: DC

## 2018-03-01 MED ORDER — NITROGLYCERIN 0.4 MG SL SUBL: 0.4000 mg | SUBLINGUAL_TABLET | SUBLINGUAL | 11 refills | Status: DC | PRN
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
## 2018-03-01 NOTE — Addendum Note (Signed)
Addended by: Austin Miles on: 03/01/2018 04:04 PM   Modules accepted: Orders

## 2018-03-01 NOTE — Patient Instructions (Addendum)
Medication Instructions:  Your physician has recommended you make the following change in your medication:   START nitroglycerin as needed for chest pain: When having chest pain, stop what you are doing and sit down. Take 1 nitro, wait 5 minutes. Still having chest pain, take 1 nitro, wait 5 minutes. Still having chest pain, take 1 nitro, dial 911. Total of 3 nitro in 15 minutes.  START ezetimide (zetia) 10 mg: take 1 tablet daily   If you need a refill on your cardiac medications before your next appointment, please call your pharmacy.   Lab work: Your physician recommends that you return for lab work in 1 month: CMP, lipid panel. No appointment needed, please fast beforehand.   If you have labs (blood work) drawn today and your tests are completely normal, you will receive your results only by: Marland Kitchen MyChart Message (if you have MyChart) OR . A paper copy in the mail If you have any lab test that is abnormal or we need to change your treatment, we will call you to review the results.  Testing/Procedures: None  Follow-Up: At [facility], you and your health needs are our priority.  As part of our continuing mission to provide you with exceptional heart care, we have created designated Provider Care Teams.  These Care Teams include your primary Cardiologist (physician) and Advanced Practice Providers (APPs -  Physician Assistants and Nurse Practitioners) who all work together to provide you with the care you need, when you need it. . You will need a follow up appointment in 6 months.  Please call our office 2 months in advance to schedule this appointment.    Nitroglycerin sublingual tablets What is this medicine? NITROGLYCERIN (nye troe GLI ser in) is a type of vasodilator. It relaxes blood vessels, increasing the blood and oxygen supply to your heart. This medicine is used to relieve chest pain caused by angina. It is also used to prevent chest pain before activities like climbing stairs,  going outdoors in cold weather, or sexual activity. This medicine may be used for other purposes; ask your health care provider or pharmacist if you have questions. COMMON BRAND NAME(S): Nitroquick, Nitrostat, Nitrotab What should I tell my health care provider before I take this medicine? They need to know if you have any of these conditions: -anemia -head injury, recent stroke, or bleeding in the brain -liver disease -previous heart attack -an unusual or allergic reaction to nitroglycerin, other medicines, foods, dyes, or preservatives -pregnant or trying to get pregnant -breast-feeding How should I use this medicine? Take this medicine by mouth as needed. At the first sign of an angina attack (chest pain or tightness) place one tablet under your tongue. You can also take this medicine 5 to 10 minutes before an event likely to produce chest pain. Follow the directions on the prescription label. Let the tablet dissolve under the tongue. Do not swallow whole. Replace the dose if you accidentally swallow it. It will help if your mouth is not dry. Saliva around the tablet will help it to dissolve more quickly. Do not eat or drink, smoke or chew tobacco while a tablet is dissolving. If you are not better within 5 minutes after taking ONE dose of nitroglycerin, call 9-1-1 immediately to seek emergency medical care. Do not take more than 3 nitroglycerin tablets over 15 minutes. If you take this medicine often to relieve symptoms of angina, your doctor or health care professional may provide you with different instructions to manage  your symptoms. If symptoms do not go away after following these instructions, it is important to call 9-1-1 immediately. Do not take more than 3 nitroglycerin tablets over 15 minutes. Talk to your pediatrician regarding the use of this medicine in children. Special care may be needed. Overdosage: If you think you have taken too much of this medicine contact a poison control  center or emergency room at once. NOTE: This medicine is only for you. Do not share this medicine with others. What if I miss a dose? This does not apply. This medicine is only used as needed. What may interact with this medicine? Do not take this medicine with any of the following medications: -certain migraine medicines like ergotamine and dihydroergotamine (DHE) -medicines used to treat erectile dysfunction like sildenafil, tadalafil, and vardenafil -riociguat This medicine may also interact with the following medications: -alteplase -aspirin -heparin -medicines for high blood pressure -medicines for mental depression -other medicines used to treat angina -phenothiazines like chlorpromazine, mesoridazine, prochlorperazine, thioridazine This list may not describe all possible interactions. Give your health care provider a list of all the medicines, herbs, non-prescription drugs, or dietary supplements you use. Also tell them if you smoke, drink alcohol, or use illegal drugs. Some items may interact with your medicine. What should I watch for while using this medicine? Tell your doctor or health care professional if you feel your medicine is no longer working. Keep this medicine with you at all times. Sit or lie down when you take your medicine to prevent falling if you feel dizzy or faint after using it. Try to remain calm. This will help you to feel better faster. If you feel dizzy, take several deep breaths and lie down with your feet propped up, or bend forward with your head resting between your knees. You may get drowsy or dizzy. Do not drive, use machinery, or do anything that needs mental alertness until you know how this drug affects you. Do not stand or sit up quickly, especially if you are an older patient. This reduces the risk of dizzy or fainting spells. Alcohol can make you more drowsy and dizzy. Avoid alcoholic drinks. Do not treat yourself for coughs, colds, or pain while you  are taking this medicine without asking your doctor or health care professional for advice. Some ingredients may increase your blood pressure. What side effects may I notice from receiving this medicine? Side effects that you should report to your doctor or health care professional as soon as possible: -blurred vision -dry mouth -skin rash -sweating -the feeling of extreme pressure in the head -unusually weak or tired Side effects that usually do not require medical attention (report to your doctor or health care professional if they continue or are bothersome): -flushing of the face or neck -headache -irregular heartbeat, palpitations -nausea, vomiting This list may not describe all possible side effects. Call your doctor for medical advice about side effects. You may report side effects to FDA at 1-800-FDA-1088. Where should I keep my medicine? Keep out of the reach of children. Store at room temperature between 20 and 25 degrees C (68 and 77 degrees F). Store in [facility] [employer]. Protect from light and moisture. Keep tightly closed. Throw away any unused medicine after the expiration date. NOTE: This sheet is a summary. It may not cover all possible information. If you have questions about this medicine, talk to your doctor, pharmacist, or health care provider.  2018 Elsevier/Gold Standard (2013-02-28 17:57:36)    Ezetimibe Tablets What  is this medicine? EZETIMIBE (ez ET i mibe) blocks the absorption of cholesterol from the stomach. It can help lower blood cholesterol for patients who are at risk of getting heart disease or a stroke. It is only for patients whose cholesterol level is not controlled by diet. This medicine may be used for other purposes; ask your health care provider or pharmacist if you have questions. COMMON BRAND NAME(S): Zetia What should I tell my health care provider before I take this medicine? They need to know if you have any of these conditions: -liver  disease -an unusual or allergic reaction to ezetimibe, medicines, foods, dyes, or preservatives -pregnant or trying to get pregnant -breast-feeding How should I use this medicine? Take this medicine by mouth with a glass of water. Follow the directions on the prescription label. This medicine can be taken with or without food. Take your doses at regular intervals. Do not take your medicine more often than directed. Talk to your pediatrician regarding the use of this medicine in children. Special care may be needed. Overdosage: If you think you have taken too much of this medicine contact a poison control center or emergency room at once. NOTE: This medicine is only for you. Do not share this medicine with others. What if I miss a dose? If you miss a dose, take it as soon as you can. If it is almost time for your next dose, take only that dose. Do not take double or extra doses. What may interact with this medicine? Do not take this medicine with any of the following medications: -fenofibrate -gemfibrozil This medicine may also interact with the following medications: -antacids -cyclosporine -herbal medicines like red yeast rice -other medicines to lower cholesterol or triglycerides This list may not describe all possible interactions. Give your health care provider a list of all the medicines, herbs, non-prescription drugs, or dietary supplements you use. Also tell them if you smoke, drink alcohol, or use illegal drugs. Some items may interact with your medicine. What should I watch for while using this medicine? Visit your doctor or health care professional for regular checks on your progress. You will need to have your cholesterol levels checked. If you are also taking some other cholesterol medicines, you will also need to have tests to make sure your liver is working properly. Tell your doctor or health care professional if you get any unexplained muscle pain, tenderness, or weakness,  especially if you also have a fever and tiredness. You need to follow a low-cholesterol, low-fat diet while you are taking this medicine. This will decrease your risk of getting heart and blood vessel disease. Exercising and avoiding alcohol and smoking can also help. Ask your doctor or dietician for advice. What side effects may I notice from receiving this medicine? Side effects that you should report to your doctor or health care professional as soon as possible: -allergic reactions like skin rash, itching or hives, swelling of the face, lips, or tongue -dark yellow or brown urine -unusually weak or tired -yellowing of the skin or eyes Side effects that usually do not require medical attention (report to your doctor or health care professional if they continue or are bothersome): -diarrhea -dizziness -headache -stomach upset or pain This list may not describe all possible side effects. Call your doctor for medical advice about side effects. You may report side effects to FDA at 1-800-FDA-1088. Where should I keep my medicine? Keep out of the reach of children. Store at room temperature between  15 and 30 degrees C (59 and 86 degrees F). Protect from moisture. Keep container tightly closed. Throw away any unused medicine after the expiration date. NOTE: This sheet is a summary. It may not cover all possible information. If you have questions about this medicine, talk to your doctor, pharmacist, or health care provider.  2018 Elsevier/Gold Standard (2011-11-07 15:39:09)

## 2018-03-14 DIAGNOSIS — J449 Chronic obstructive pulmonary disease, unspecified: Secondary | ICD-10-CM | POA: Diagnosis not present

## 2018-03-23 DIAGNOSIS — J449 Chronic obstructive pulmonary disease, unspecified: Secondary | ICD-10-CM | POA: Diagnosis not present

## 2018-03-27 LAB — LIPID PANEL
CHOL/HDL RATIO: 2.4 ratio (ref 0.0–4.4)
CHOLESTEROL TOTAL: 189 mg/dL (ref 100–199)
HDL: 78 mg/dL (ref >39)
LDL Calculated: 88 mg/dL (ref 0–99)
TRIGLYCERIDES: 115 mg/dL (ref 0–149)
VLDL Cholesterol Cal: 23 mg/dL (ref 5–40)

## 2018-03-27 LAB — COMPREHENSIVE METABOLIC PANEL
A/G RATIO: 1.9 (ref 1.2–2.2)
ALK PHOS: 73 IU/L (ref 39–117)
ALT: 9 IU/L (ref 0–32)
AST: 14 IU/L (ref 0–40)
Albumin: 4.4 g/dL (ref 3.5–5.5)
BUN/Creatinine Ratio: 13 (ref 9–23)
BUN: 10 mg/dL (ref 6–24)
Bilirubin Total: <0.2 mg/dL (ref 0.0–1.2)
CALCIUM: 9.1 mg/dL (ref 8.7–10.2)
CO2: 25 mmol/L (ref 20–29)
Chloride: 99 mmol/L (ref 96–106)
Creatinine, Ser: 0.8 mg/dL (ref 0.57–1.00)
GFR calc Af Amer: 93 mL/min/{1.73_m2} (ref >59)
GFR calc non Af Amer: 81 mL/min/{1.73_m2} (ref >59)
Globulin, Total: 2.3 g/dL (ref 1.5–4.5)
Glucose: 78 mg/dL (ref 65–99)
POTASSIUM: 4 mmol/L (ref 3.5–5.2)
Sodium: 141 mmol/L (ref 134–144)
Total Protein: 6.7 g/dL (ref 6.0–8.5)

## 2018-04-04 DIAGNOSIS — J449 Chronic obstructive pulmonary disease, unspecified: Secondary | ICD-10-CM | POA: Diagnosis not present

## 2018-04-04 DIAGNOSIS — R7303 Prediabetes: Secondary | ICD-10-CM | POA: Diagnosis not present

## 2018-04-22 DIAGNOSIS — J449 Chronic obstructive pulmonary disease, unspecified: Secondary | ICD-10-CM | POA: Diagnosis not present

## 2018-04-24 DIAGNOSIS — R7303 Prediabetes: Secondary | ICD-10-CM | POA: Diagnosis not present

## 2018-04-24 DIAGNOSIS — J449 Chronic obstructive pulmonary disease, unspecified: Secondary | ICD-10-CM | POA: Diagnosis not present

## 2018-04-24 DIAGNOSIS — J40 Bronchitis, not specified as acute or chronic: Secondary | ICD-10-CM | POA: Diagnosis not present

## 2018-04-24 DIAGNOSIS — E785 Hyperlipidemia, unspecified: Secondary | ICD-10-CM | POA: Diagnosis not present

## 2018-04-24 DIAGNOSIS — E559 Vitamin D deficiency, unspecified: Secondary | ICD-10-CM | POA: Diagnosis not present

## 2018-04-29 DIAGNOSIS — H5712 Ocular pain, left eye: Secondary | ICD-10-CM | POA: Diagnosis not present

## 2018-05-03 DIAGNOSIS — R7303 Prediabetes: Secondary | ICD-10-CM | POA: Diagnosis not present

## 2018-05-03 DIAGNOSIS — Z0001 Encounter for general adult medical examination with abnormal findings: Secondary | ICD-10-CM | POA: Diagnosis not present

## 2018-05-03 DIAGNOSIS — E785 Hyperlipidemia, unspecified: Secondary | ICD-10-CM | POA: Diagnosis not present

## 2018-05-03 DIAGNOSIS — Z87891 Personal history of nicotine dependence: Secondary | ICD-10-CM | POA: Diagnosis not present

## 2018-05-03 DIAGNOSIS — E559 Vitamin D deficiency, unspecified: Secondary | ICD-10-CM | POA: Diagnosis not present

## 2018-05-03 DIAGNOSIS — J449 Chronic obstructive pulmonary disease, unspecified: Secondary | ICD-10-CM | POA: Diagnosis not present

## 2018-05-04 ENCOUNTER — Ambulatory Visit (INDEPENDENT_AMBULATORY_CARE_PROVIDER_SITE_OTHER): Payer: BLUE CROSS/BLUE SHIELD | Admitting: Nurse Practitioner

## 2018-05-04 ENCOUNTER — Encounter: Payer: Self-pay | Admitting: Nurse Practitioner

## 2018-05-04 VITALS — BP 122/68 | HR 75 | Ht 63.0 in | Wt 178.8 lb

## 2018-05-04 DIAGNOSIS — J209 Acute bronchitis, unspecified: Secondary | ICD-10-CM | POA: Diagnosis not present

## 2018-05-04 DIAGNOSIS — J440 Chronic obstructive pulmonary disease with acute lower respiratory infection: Secondary | ICD-10-CM | POA: Diagnosis not present

## 2018-05-04 DIAGNOSIS — R0602 Shortness of breath: Secondary | ICD-10-CM | POA: Diagnosis not present

## 2018-05-04 MED ORDER — LEVALBUTEROL HCL 0.63 MG/3ML IN NEBU
0.6300 mg | INHALATION_SOLUTION | Freq: Once | RESPIRATORY_TRACT | Status: AC
  Administered 2018-05-04: 0.63 mg via RESPIRATORY_TRACT

## 2018-05-04 MED ORDER — DOXYCYCLINE HYCLATE 100 MG PO TABS: 100.0000 mg | ORAL_TABLET | Freq: Two times a day (BID) | ORAL | 0 refills | Status: DC

## 2018-05-04 MED ORDER — PREDNISONE 10 MG PO TABS: ORAL_TABLET | ORAL | 0 refills | Status: DC

## 2018-05-04 MED ORDER — METHYLPREDNISOLONE ACETATE 80 MG/ML IJ SUSP
80.0000 mg | Freq: Once | INTRAMUSCULAR | Status: AC
  Administered 2018-05-04: 80 mg via INTRAMUSCULAR
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
## 2018-05-04 NOTE — Patient Instructions (Signed)
Will order doxycycline Will order prednisone taper May take mucinex May take delsym as needed for cough DepoMedrol given in office today xopenex neb given in office today Please call if symptoms do not improve or worsen

## 2018-05-07 ENCOUNTER — Other Ambulatory Visit: Payer: Self-pay | Admitting: Pulmonary Disease

## 2018-05-07 ENCOUNTER — Encounter: Payer: Self-pay | Admitting: Nurse Practitioner

## 2018-05-07 DIAGNOSIS — J440 Chronic obstructive pulmonary disease with acute lower respiratory infection: Principal | ICD-10-CM

## 2018-05-07 DIAGNOSIS — J209 Acute bronchitis, unspecified: Secondary | ICD-10-CM | POA: Insufficient documentation

## 2018-05-07 MED ORDER — MONTELUKAST SODIUM 10 MG PO TABS: ORAL_TABLET | ORAL | 0 refills | Status: DC

## 2018-05-07 NOTE — Assessment & Plan Note (Signed)
Slow to resolve exacerbation with bronchitis  Patient Instructions  Will order doxycycline Will order prednisone taper May take mucinex May take delsym as needed for cough DepoMedrol given in office today xopenex neb given in office today Please call if symptoms do not improve or worsen

## 2018-05-07 NOTE — Progress Notes (Signed)
Received refill request for Montelukast. Refill sent to requested pharmacy.

## 2018-05-21 DIAGNOSIS — N816 Rectocele: Secondary | ICD-10-CM | POA: Diagnosis not present

## 2018-05-23 DIAGNOSIS — J449 Chronic obstructive pulmonary disease, unspecified: Secondary | ICD-10-CM | POA: Diagnosis not present

## 2018-05-24 ENCOUNTER — Telehealth: Payer: Self-pay

## 2018-05-24 MED ORDER — EZETIMIBE 10 MG PO TABS: 10.0000 mg | ORAL_TABLET | Freq: Every day | ORAL | 3 refills | Status: DC

## 2018-05-24 NOTE — Telephone Encounter (Signed)
Rx for ezetimbe 10mg  #30 sent to [facility] as requested.

## 2018-06-05 ENCOUNTER — Encounter: Payer: Self-pay | Admitting: Pulmonary Disease

## 2018-06-05 ENCOUNTER — Ambulatory Visit (INDEPENDENT_AMBULATORY_CARE_PROVIDER_SITE_OTHER): Payer: BLUE CROSS/BLUE SHIELD | Admitting: Pulmonary Disease

## 2018-06-05 VITALS — BP 120/76 | HR 78 | Ht 63.0 in | Wt 181.2 lb

## 2018-06-05 DIAGNOSIS — J449 Chronic obstructive pulmonary disease, unspecified: Secondary | ICD-10-CM

## 2018-06-05 NOTE — Progress Notes (Signed)
[name]    [id number]    10/11/1960  Primary Care Physician:Osei-Bonsu, Iona Beard, MD  Referring Physician: Benito Mccreedy, MD [place], [place] [address]  Chief complaint:  Follow-up for  Severe COPD  HPI: 58 year old smoker with very severe COPD, chronic hypoxic respiratory failure.  She is previously followed by Dr. Ashok Cordia.  Pulmicort and Perforomist were ordered last year but insurance would not cover She was then placed on Symbicort in 2018 but had worsening symptoms of dyspnea, wheeze. Inhalers were switched to Trelegy at office visit in March 2019.    Pets: No pets, birds, farm animals Occupation: Previously worked doing Environmental manager jobs in [employer]  Currently works in a Manufacturing engineer.  Exposed to D60 a gas substitute and her line of work. Exposures: No mold, hot tub, Jacuzzi. Smoking history: 35-pack-year smoker.  Quit in 2011 Travel history: No significant travel history Relevant family history: No significant family history of lung disease.  Interim history: Treated for COPD exacerbation in December.  She got 2 rounds of prednisone, z pack, doxycycline before improvement in symptoms.  She states that her breathing is improved and is back to baseline Complains of chronic dyspnea on exertion, cough with mucus production.  No fevers, chills.  Continues on Trelegy.  She is also using Symbicort with the Trelegy And supplemental oxygen  She is due for a gynecologic operation around March and is asking about her readiness to undergo the procedure.  Outpatient Encounter Medications as of 06/05/2018  Medication Sig  . albuterol (PROVENTIL HFA;VENTOLIN HFA) 108 (90 Base) MCG/ACT inhaler TAKE 2 PUFFS BY MOUTH EVERY 6 HOURS AS NEEDED FOR WHEEZE OR SHORTNESS OF BREATH  . albuterol (PROVENTIL) (2.5 MG/3ML) 0.083% nebulizer solution Take 3 mLs (2.5 mg total) by nebulization every 6 (six) hours.  Marland Kitchen amLODipine (NORVASC) 5 MG tablet  Take 5 mg by mouth daily.  . budesonide-formoterol (SYMBICORT) 160-4.5 MCG/ACT inhaler Symbicort 160 mcg-4.5 mcg/actuation HFA aerosol inhaler  INHALE 2 PUFFS TWICE A DAY  . clopidogrel (PLAVIX) 75 MG tablet Take 75 mg by mouth daily.  Marland Kitchen ezetimibe (ZETIA) 10 MG tablet Take 1 tablet (10 mg total) by mouth daily.  . montelukast (SINGULAIR) 10 MG tablet TAKE 1 TABLET BY MOUTH EVERYDAY AT BEDTIME  . OXYGEN Inhale 3 L into the lungs as needed.  . ranitidine (ZANTAC) 150 MG tablet TAKE 1 TABLET (150 MG TOTAL) BY MOUTH AT BEDTIME.  Marland Kitchen TRELEGY ELLIPTA 100-62.5-25 MCG/INH AEPB TAKE 1 PUFF BY MOUTH EVERY DAY  . [DISCONTINUED] doxycycline (VIBRA-TABS) 100 MG tablet Take 1 tablet (100 mg total) by mouth 2 (two) times daily.  . [DISCONTINUED] predniSONE (DELTASONE) 10 MG tablet 4 tabs x 3 days, 3 tabs x 3 days, 2 tabs x 3 days, 1 tab x 3 days then stop  . [DISCONTINUED] predniSONE (DELTASONE) 10 MG tablet Take 4 tabs for 2 days, then 3 tabs for 2 days, then 2 tabs for 2 days, then 1 tab for 2 days, then stop  . nitroGLYCERIN (NITROSTAT) 0.4 MG SL tablet Place 1 tablet (0.4 mg total) under the tongue every 5 (five) minutes as needed.   No facility-administered encounter medications on file as of 06/05/2018.    Physical Exam: Blood pressure 112/60, pulse 80, height 5\' 3"  (1.6 m), weight 176 lb (79.8 kg), SpO2 95 %. Gen:      No acute distress HEENT:  EOMI, sclera anicteric Neck:     No  masses; no thyromegaly Lungs:    Clear to auscultation bilaterally; normal respiratory effort CV:         Regular rate and rhythm; no murmurs Abd:      + bowel sounds; soft, non-tender; no palpable masses, no distension Ext:    No edema; adequate peripheral perfusion Skin:      Warm and dry; no rash Neuro: alert and oriented x 3 Psych: normal mood and affect  Data Reviewed: Low-dose screening CT chest 08/18/16-mild emphysema, bronchial wall thickening.  Subpleural calcified left upper lobe granuloma.  Low-dose screening CT  09/05/2017- emphysema, dependent atelectasis, stable pulmonary nodule I have reviewed the images personally.  PFT 06/02/17 FVC 1.50 [60%], FEV1 0.79 [40%], F/F 53, TLC 96, RV/TLC 167%, DLCO 32% Severe obstruction with bronchodilator response, air-trapping.  Severe diffusion impairment.  02/16/16: FVC 1.25 L (49%) FEV1 0.65 L (32%) FEV1/FVC 0.52 FEF 25-75 0.30 L (14%) positive bronchodilator response 11/10/15: FVC 1.49 L (88%) FEV1 0.79 L (39%) FEV1/FVC 0.53 FEF 25-75 0.40 L (19%) negative bronchodilator response TLC 4.23 L (88%) RV 153% ERV 81% DLCO uncorrected 48% (hemoglobin 11.5) 02/21/11: FVC 2.19 L (69%) FEV1 1.01 L (42%) FEV1/FVC 0.46 FEF 25-75 0.36 L (13%) negative bronchodilator response TLC 4.36 L (92%) RV 129% ERV 63% DLCO uncorrected 31%   6MWT 11/10/15:  Walked 360 meters / Baseline Sat 96% on RA / Nadir Sat 95% on RA   IMAGING Low-dose screening CT chest 08/17/16-mild emphysema, bronchial wall thickening.  Subpleural calcified left upper lobe granuloma.  I have reviewed the images personally.  MICROBIOLOGY Sputum Ctx (11/13/15):  Paecilomyces species / Oral Flora / AFB negative    LABS 03/01/15 HIV:  Negative   09/29/14 ANA:  Negative   05/16/11 Alpha-1 antitrypsin: MM (122)  Cardiac work-up by Dr. Einar Gip Nuclear stress test 11/14/16-very small inferoseptal ischemia, preserved LVEF with mild hypokinesis the same region Echocardiogram shows mild pulmonary hypertension with normal LV systolic function.  Assessment:  Severe COPD. Currently on Trelegy.  She is using Symbicort at the same time I am instructed to just keep the Trelegy and stop the Symbicort altogether Continue supplemental oxygen with exertion at night  Pulmonary nodules Continue annual screening CT of the chest  Preop eval for gynecologic surgery She is at increased risk for perioperative complication but no contraindication to surgery Breathing is stable at present with no evidence of  exacerbation Recommend continuing inhalers, supplemental oxygen, early mobilization, incentive spirometer.   Health maintenance Influenza vaccine-states that she got her flu vaccine this year at [facility]. 09/03/2011-Pneumovax  Plan/Recommendations: - Continue trelegy.  Stop using Symbicort - Continue supplemental oxygen  Marshell Garfinkel MD  Pulmonary and Critical Care 06/05/2018, 3:22 PM  CC: Benito Mccreedy, MD

## 2018-06-05 NOTE — Patient Instructions (Signed)
I am glad you are doing better with regard to your breathing Continue the Trelegy inhaler. You should not use Symbicort at the same time using Trelegy Continue albuterol rescue inhaler  If your breathing remained stable you should be okay to undergo the gynecologic surgery Follow-up in 3 months.

## 2018-06-21 DIAGNOSIS — J449 Chronic obstructive pulmonary disease, unspecified: Secondary | ICD-10-CM | POA: Diagnosis not present

## 2018-06-23 DIAGNOSIS — J449 Chronic obstructive pulmonary disease, unspecified: Secondary | ICD-10-CM | POA: Diagnosis not present

## 2018-07-04 DIAGNOSIS — J449 Chronic obstructive pulmonary disease, unspecified: Secondary | ICD-10-CM | POA: Diagnosis not present

## 2018-07-10 DIAGNOSIS — I10 Essential (primary) hypertension: Secondary | ICD-10-CM | POA: Insufficient documentation

## 2018-07-10 DIAGNOSIS — J302 Other seasonal allergic rhinitis: Secondary | ICD-10-CM

## 2018-07-10 DIAGNOSIS — R9431 Abnormal electrocardiogram [ECG] [EKG]: Secondary | ICD-10-CM | POA: Diagnosis not present

## 2018-07-10 DIAGNOSIS — Z9981 Dependence on supplemental oxygen: Secondary | ICD-10-CM | POA: Insufficient documentation

## 2018-07-10 HISTORY — DX: Dependence on supplemental oxygen: Z99.81

## 2018-07-10 HISTORY — DX: Essential (primary) hypertension: I10

## 2018-07-15 HISTORY — PX: REPAIR RECTOCELE: SUR1206

## 2018-07-17 ENCOUNTER — Telehealth: Payer: Self-pay | Admitting: Pulmonary Disease

## 2018-07-17 NOTE — Telephone Encounter (Signed)
ATC pt, no answer. Left message for pt to call back.  I am not sure who called, there is no documentation of this.

## 2018-07-18 DIAGNOSIS — N816 Rectocele: Secondary | ICD-10-CM | POA: Diagnosis not present

## 2018-07-18 NOTE — Telephone Encounter (Signed)
It was [name] who tried to call the patient yesterday to reschedule her appt on 09/04/18 because Dr. [name] will not be in the office that day

## 2018-07-18 NOTE — Telephone Encounter (Signed)
It wasn't me that tried to call her. I haven't started scheduling my April CT's yet

## 2018-07-18 NOTE — Telephone Encounter (Signed)
I see she is scheduled for a ct- [name] did you try calling her?

## 2018-07-20 DIAGNOSIS — J449 Chronic obstructive pulmonary disease, unspecified: Secondary | ICD-10-CM | POA: Diagnosis not present

## 2018-07-20 NOTE — Telephone Encounter (Signed)
Called and left detailed message for Patient to call back.  Patient needs to reschedule 09/04/18 OV with Dr [name].

## 2018-07-22 DIAGNOSIS — J449 Chronic obstructive pulmonary disease, unspecified: Secondary | ICD-10-CM | POA: Diagnosis not present

## 2018-07-23 ENCOUNTER — Other Ambulatory Visit: Payer: Self-pay

## 2018-07-23 DIAGNOSIS — I2510 Atherosclerotic heart disease of native coronary artery without angina pectoris: Secondary | ICD-10-CM

## 2018-07-23 MED ORDER — AMLODIPINE BESYLATE 5 MG PO TABS: 2.5000 mg | ORAL_TABLET | Freq: Two times a day (BID) | ORAL | 2 refills | Status: DC

## 2018-07-23 NOTE — Telephone Encounter (Signed)
Pt's appointment has been rescheduled to 09/13/2018.

## 2018-08-01 DIAGNOSIS — E785 Hyperlipidemia, unspecified: Secondary | ICD-10-CM | POA: Diagnosis not present

## 2018-08-01 DIAGNOSIS — Z0000 Encounter for general adult medical examination without abnormal findings: Secondary | ICD-10-CM | POA: Diagnosis not present

## 2018-08-01 DIAGNOSIS — Z0110 Encounter for examination of ears and hearing without abnormal findings: Secondary | ICD-10-CM | POA: Diagnosis not present

## 2018-08-01 DIAGNOSIS — Z131 Encounter for screening for diabetes mellitus: Secondary | ICD-10-CM | POA: Diagnosis not present

## 2018-08-01 DIAGNOSIS — J449 Chronic obstructive pulmonary disease, unspecified: Secondary | ICD-10-CM | POA: Diagnosis not present

## 2018-08-07 ENCOUNTER — Other Ambulatory Visit: Payer: Self-pay | Admitting: Pulmonary Disease

## 2018-08-13 ENCOUNTER — Other Ambulatory Visit: Payer: Self-pay | Admitting: Cardiology

## 2018-08-13 MED ORDER — CLOPIDOGREL BISULFATE 75 MG PO TABS: 75.0000 mg | ORAL_TABLET | Freq: Every day | ORAL | 1 refills | Status: DC

## 2018-08-13 NOTE — Telephone Encounter (Signed)
°*  STAT* If patient is at the pharmacy, call can be transferred to refill team.   1. Which medications need to be refilled? (please list name of each medication and dose if known) Clopidogrel 75 mg tablet once daily  2. Which pharmacy/location (including street and city if local pharmacy) is medication to be sent to? [facility] [address] [place]  3. Do they need a 30 day or 90 day supply? [place]

## 2018-08-15 ENCOUNTER — Other Ambulatory Visit: Payer: Self-pay | Admitting: Cardiology

## 2018-08-17 ENCOUNTER — Ambulatory Visit (INDEPENDENT_AMBULATORY_CARE_PROVIDER_SITE_OTHER): Payer: BLUE CROSS/BLUE SHIELD | Admitting: Primary Care

## 2018-08-17 ENCOUNTER — Encounter: Payer: Self-pay | Admitting: Primary Care

## 2018-08-17 ENCOUNTER — Other Ambulatory Visit: Payer: Self-pay

## 2018-08-17 DIAGNOSIS — J441 Chronic obstructive pulmonary disease with (acute) exacerbation: Secondary | ICD-10-CM | POA: Diagnosis not present

## 2018-08-17 MED ORDER — PREDNISONE 10 MG PO TABS: ORAL_TABLET | ORAL | 0 refills | Status: DC

## 2018-08-17 MED ORDER — TIOTROPIUM BROMIDE MONOHYDRATE 2.5 MCG/ACT IN AERS: 2.0000 | INHALATION_SPRAY | Freq: Every day | RESPIRATORY_TRACT | 2 refills | Status: DC

## 2018-08-17 NOTE — Patient Instructions (Signed)
Continue Symbicort two puffs twice daily  Albuterol as needed every 6 hours for shortness of breath   Add Spiriva 2 puffs once daily  Start Prednisone 40mg  x 3 days; 30mg  x 3 days; 20mg  x 3 days, 10mg  x 3 days  Rx: 1. Prednisone taper 2. Spiriva inhaler   Follow up : July with Dr. [name] or NP

## 2018-08-17 NOTE — Progress Notes (Signed)
Virtual Visit via Telephone Note  I connected with [name] on 08/17/18 at  3:30 PM EDT by telephone and verified that I am speaking with the correct person using two identifiers.   I discussed the limitations, risks, security and privacy concerns of performing an evaluation and management service by telephone and the availability of in person appointments. I also discussed with the patient that there may be a patient responsible charge related to this service. The patient expressed understanding and agreed to proceed.  Patient at home, agreeing to E-vist. Myself and [name] present on phone call. My nurse lisa to set up follow-up and mail AVS.  History of Present Illness: 58 year old female, former smoker. PMH significant for CAD, COPD, nocturnal hypoxia, chronic respiratory failure. Patient of Dr. [name], last seen on 06/05/18 and advised patient start Trelegy and prn albuterol hfa.   08/17/2018  Called patient for acute visit for increased shortness of breath and wheezing. Not taking Trelegy, currently using Symbicort. States that she can't get enough air into her lungs. Using nebulizer every 6 hours but still findings it hard to breath. Associated wheezing. Went to her PCP last week, she was given prednisone 20mg  x 5 days. Denies cough, fever, leg swelling or weight gain.   Observations/Objective:  - Temp 97.8 Wednesday 08/15/18  Assessment and Plan:  COPD with acute exacerbation  - Patient not taking Trelegy - Continue Symbicort 160 two puffs twice daily  - Add Spiriva respimat 2.5 (samples given) - RX prednisone taper, abx not indicated at this time   Follow Up Instructions:  - FU in July with Dr. [name] or NP or sooner if symptoms do not improve    I discussed the assessment and treatment plan with the patient. The patient was provided an opportunity to ask questions and all were answered. The patient agreed with the plan and demonstrated an understanding of the  instructions.   The patient was advised to call back or seek an in-person evaluation if the symptoms worsen or if the condition fails to improve as anticipated.  I provided 22 minutes of non-face-to-face time during this encounter.   Martyn Ehrich, NP

## 2018-08-17 NOTE — Addendum Note (Signed)
Addended by: Martyn Ehrich on: 08/17/2018 03:55 PM   Modules accepted: Orders

## 2018-08-20 DIAGNOSIS — J449 Chronic obstructive pulmonary disease, unspecified: Secondary | ICD-10-CM | POA: Diagnosis not present

## 2018-08-22 DIAGNOSIS — J449 Chronic obstructive pulmonary disease, unspecified: Secondary | ICD-10-CM | POA: Diagnosis not present

## 2018-08-30 ENCOUNTER — Telehealth: Payer: Self-pay

## 2018-08-30 NOTE — Telephone Encounter (Signed)
YOUR CARDIOLOGY TEAM HAS ARRANGED FOR AN E-VISIT FOR YOUR APPOINTMENT - PLEASE REVIEW IMPORTANT INFORMATION BELOW SEVERAL DAYS PRIOR TO YOUR APPOINTMENT  Due to the recent COVID-19 pandemic, we are transitioning in-person office visits to tele-medicine visits in an effort to decrease unnecessary exposure to our patients, their families, and staff. Medicare and most insurances are covering these visits without a copay needed. We also encourage you to sign up for MyChart if you have not already done so. You will need a smartphone if possible. For patients that do not have this, we can still complete the visit using a regular telephone but do prefer a smartphone to enable video when possible. You may have a family member that lives with you that can help. If possible, we also ask that you have a blood pressure cuff and scale at home to measure your blood pressure, heart rate and weight prior to your scheduled appointment. Patients with clinical needs that need an in-person evaluation and testing will still be able to come to the office if absolutely necessary. If you have any questions, feel free to call our office.     YOUR PROVIDER WILL BE USING THE FOLLOWING PLATFORM TO COMPLETE YOUR VISIT: DoxyMe**   . IF USING DOXIMITY or [email or web address].ME - The staff will give you instructions on receiving your link to join the meeting the day of your visit.      2-3 DAYS BEFORE YOUR APPOINTMENT  You will receive a telephone call from one of our [place] team members - your caller ID may say "Unknown caller." If this is a video visit, we will walk you through how to set up your device to be able to complete the visit. We will remind you check your blood pressure, heart rate and weight prior to your scheduled appointment. If you have an Apple Watch or Kardia, please upload any pertinent ECG strips the day before or morning of your appointment to [facility]. Our staff will also make sure you have reviewed the consent and agree  to move forward with your scheduled tele-health visit.     THE DAY OF YOUR APPOINTMENT  Approximately 15 minutes prior to your scheduled appointment, you will receive a telephone call from one of [place] team - your caller ID may say "Unknown caller."  Our staff will confirm medications, vital signs for the day and any symptoms you may be experiencing. Please have this information available prior to the time of visit start. It may also be helpful for you to have a pad of paper and pen handy for any instructions given during your visit. They will also walk you through joining the smartphone meeting if this is a video visit.    CONSENT FOR TELE-HEALTH VISIT - PLEASE REVIEW  I hereby voluntarily request, consent and authorize [facility] and its employed or contracted physicians, physician assistants, nurse practitioners or other licensed health care professionals (the Practitioner), to provide me with telemedicine health care services (the "Services") as deemed necessary by the treating Practitioner. I acknowledge and consent to receive the Services by the Practitioner via telemedicine. I understand that the telemedicine visit will involve communicating with the Practitioner through live audiovisual communication technology and the disclosure of certain medical information by electronic transmission. I acknowledge that I have been given the opportunity to request an in-person assessment or other available alternative prior to the telemedicine visit and am voluntarily participating in the telemedicine visit.  I understand that I have the right to withhold or withdraw  my consent to the use of telemedicine in the course of my care at any time, without affecting my right to future care or treatment, and that the Practitioner or I may terminate the telemedicine visit at any time. I understand that I have the right to inspect all information obtained and/or recorded in the course of the telemedicine visit  and may receive copies of available information for a reasonable fee.  I understand that some of the potential risks of receiving the Services via telemedicine include:  Marland Kitchen Delay or interruption in medical evaluation due to technological equipment failure or disruption; . Information transmitted may not be sufficient (e.g. poor resolution of images) to allow for appropriate medical decision making by the Practitioner; and/or  . In rare instances, security protocols could fail, causing a breach of personal health information.  Furthermore, I acknowledge that it is my responsibility to provide information about my medical history, conditions and care that is complete and accurate to the best of my ability. I acknowledge that Practitioner's advice, recommendations, and/or decision may be based on factors not within their control, such as incomplete or inaccurate data provided by me or distortions of diagnostic images or specimens that may result from electronic transmissions. I understand that the practice of medicine is not an exact science and that Practitioner makes no warranties or guarantees regarding treatment outcomes. I acknowledge that I will receive a copy of this consent concurrently upon execution via email to the email address I last provided but may also request a printed copy by calling the office of [facility].    I understand that my insurance will be billed for this visit.   I have read or had this consent read to me. . I understand the contents of this consent, which adequately explains the benefits and risks of the Services being provided via telemedicine.  . I have been provided ample opportunity to ask questions regarding this consent and the Services and have had my questions answered to my satisfaction. . I give my informed consent for the services to be provided through the use of telemedicine in my medical care  By participating in this telemedicine visit I agree to the above.   Patient gave verbal consent to have virtual visit with Dr Bettina Gavia.  Copy also sent by MyChart.

## 2018-08-31 ENCOUNTER — Telehealth (INDEPENDENT_AMBULATORY_CARE_PROVIDER_SITE_OTHER): Payer: BLUE CROSS/BLUE SHIELD | Admitting: Cardiology

## 2018-08-31 ENCOUNTER — Encounter: Payer: Self-pay | Admitting: Cardiology

## 2018-08-31 VITALS — Ht 63.0 in | Wt 179.0 lb

## 2018-08-31 DIAGNOSIS — I25118 Atherosclerotic heart disease of native coronary artery with other forms of angina pectoris: Secondary | ICD-10-CM | POA: Diagnosis not present

## 2018-08-31 DIAGNOSIS — J449 Chronic obstructive pulmonary disease, unspecified: Secondary | ICD-10-CM

## 2018-08-31 DIAGNOSIS — E7800 Pure hypercholesterolemia, unspecified: Secondary | ICD-10-CM

## 2018-08-31 MED ORDER — FAMOTIDINE 20 MG PO TABS: 20.0000 mg | ORAL_TABLET | Freq: Two times a day (BID) | ORAL | 1 refills | Status: DC

## 2018-08-31 NOTE — Patient Instructions (Signed)
Medication Instructions:  Your physician has recommended you make the following change in your medication:  STOP: ZANTAC START: Famotadine 20mg  Twice daily  If you need a refill on your cardiac medications before your next appointment, please call your pharmacy.   Lab work: None  If you have labs (blood work) drawn today and your tests are completely normal, you will receive your results only by: Marland Kitchen MyChart Message (if you have MyChart) OR . A paper copy in the mail If you have any lab test that is abnormal or we need to change your treatment, we will call you to review the results.  Testing/Procedures: None  Follow-Up: At [facility], you and your health needs are our priority.  As part of our continuing mission to provide you with exceptional heart care, we have created designated Provider Care Teams.  These Care Teams include your primary Cardiologist (physician) and Advanced Practice Providers (APPs -  Physician Assistants and Nurse Practitioners) who all work together to provide you with the care you need, when you need it. You will need a follow up appointment in 6 months. Any Other Special Instructions Will Be Listed Below (If Applicable).
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
## 2018-09-04 ENCOUNTER — Ambulatory Visit: Payer: BLUE CROSS/BLUE SHIELD | Admitting: Pulmonary Disease

## 2018-09-12 ENCOUNTER — Ambulatory Visit: Payer: BLUE CROSS/BLUE SHIELD | Admitting: Primary Care

## 2018-09-13 ENCOUNTER — Encounter: Payer: Self-pay | Admitting: Primary Care

## 2018-09-13 ENCOUNTER — Ambulatory Visit: Payer: BLUE CROSS/BLUE SHIELD | Admitting: Pulmonary Disease

## 2018-09-13 NOTE — Progress Notes (Signed)
No showed for apt

## 2018-09-14 DIAGNOSIS — L988 Other specified disorders of the skin and subcutaneous tissue: Secondary | ICD-10-CM | POA: Diagnosis not present

## 2018-09-19 DIAGNOSIS — J449 Chronic obstructive pulmonary disease, unspecified: Secondary | ICD-10-CM | POA: Diagnosis not present

## 2018-09-21 DIAGNOSIS — J449 Chronic obstructive pulmonary disease, unspecified: Secondary | ICD-10-CM | POA: Diagnosis not present

## 2018-09-23 ENCOUNTER — Other Ambulatory Visit: Payer: Self-pay | Admitting: Pulmonary Disease

## 2018-10-04 ENCOUNTER — Other Ambulatory Visit: Payer: Self-pay

## 2018-10-04 ENCOUNTER — Encounter: Payer: Self-pay | Admitting: Nurse Practitioner

## 2018-10-04 ENCOUNTER — Ambulatory Visit (INDEPENDENT_AMBULATORY_CARE_PROVIDER_SITE_OTHER): Payer: BLUE CROSS/BLUE SHIELD | Admitting: Nurse Practitioner

## 2018-10-04 VITALS — BP 110/62 | HR 88 | Temp 98.5°F | Ht 63.0 in | Wt 174.8 lb

## 2018-10-04 DIAGNOSIS — J441 Chronic obstructive pulmonary disease with (acute) exacerbation: Secondary | ICD-10-CM | POA: Diagnosis not present

## 2018-10-04 MED ORDER — METHYLPREDNISOLONE ACETATE 80 MG/ML IJ SUSP
80.0000 mg | Freq: Once | INTRAMUSCULAR | Status: AC
  Administered 2018-10-04: 80 mg via INTRAMUSCULAR

## 2018-10-04 MED ORDER — PREDNISONE 10 MG PO TABS: ORAL_TABLET | ORAL | 0 refills | Status: DC

## 2018-10-04 NOTE — Patient Instructions (Addendum)
COPD exacerbation: Depo Medrol given in office today Will order prednisone taper Continue Trelegy Continue albuterol as needed Continue sinugulair  Follow up with Dr. [name] in 2 months or sooner if needed

## 2018-10-04 NOTE — Assessment & Plan Note (Signed)
Patient presents today for an acute visit.  Patient complains of shortness of breath and wheezing that started yesterday.  Patient denies any chest pain or lower extremity edema.  Patient states that she has been compliant with Trelegy and albuterol as needed.  Patient also takes Singulair daily.  She denies any recent fever.  She denies any sinus congestion or pressure.  She denies any significant cough.  Patient's breathing does not appear to be labored in office today.  Does have significant wheezing throughout.  We will treat for COPD exacerbation.  Patient Instructions  COPD exacerbation: Depo Medrol given in office today Will order prednisone taper Continue Trelegy Continue albuterol as needed Continue sinugulair  Follow up with Dr. [name] in 2 months or sooner if needed
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
## 2018-10-19 ENCOUNTER — Other Ambulatory Visit: Payer: Self-pay | Admitting: Acute Care

## 2018-10-19 DIAGNOSIS — Z122 Encounter for screening for malignant neoplasm of respiratory organs: Secondary | ICD-10-CM

## 2018-10-19 DIAGNOSIS — Z87891 Personal history of nicotine dependence: Secondary | ICD-10-CM

## 2018-10-20 DIAGNOSIS — J449 Chronic obstructive pulmonary disease, unspecified: Secondary | ICD-10-CM | POA: Diagnosis not present

## 2018-11-02 ENCOUNTER — Other Ambulatory Visit: Payer: Self-pay | Admitting: Pulmonary Disease

## 2018-11-13 DIAGNOSIS — J449 Chronic obstructive pulmonary disease, unspecified: Secondary | ICD-10-CM | POA: Diagnosis not present

## 2018-11-13 DIAGNOSIS — R7303 Prediabetes: Secondary | ICD-10-CM | POA: Diagnosis not present

## 2018-11-13 DIAGNOSIS — E559 Vitamin D deficiency, unspecified: Secondary | ICD-10-CM | POA: Diagnosis not present

## 2018-11-13 DIAGNOSIS — Z87891 Personal history of nicotine dependence: Secondary | ICD-10-CM | POA: Diagnosis not present

## 2018-11-13 DIAGNOSIS — E785 Hyperlipidemia, unspecified: Secondary | ICD-10-CM | POA: Diagnosis not present

## 2018-11-19 DIAGNOSIS — J449 Chronic obstructive pulmonary disease, unspecified: Secondary | ICD-10-CM | POA: Diagnosis not present

## 2018-11-22 ENCOUNTER — Other Ambulatory Visit: Payer: Self-pay | Admitting: Cardiology

## 2018-11-26 DIAGNOSIS — R8281 Pyuria: Secondary | ICD-10-CM | POA: Diagnosis not present

## 2018-12-11 ENCOUNTER — Inpatient Hospital Stay: Admission: RE | Admit: 2018-12-11 | Payer: BLUE CROSS/BLUE SHIELD | Source: Ambulatory Visit

## 2018-12-13 ENCOUNTER — Other Ambulatory Visit: Payer: Self-pay

## 2018-12-13 ENCOUNTER — Ambulatory Visit (INDEPENDENT_AMBULATORY_CARE_PROVIDER_SITE_OTHER)
Admission: RE | Admit: 2018-12-13 | Discharge: 2018-12-13 | Disposition: A | Discharge status: Home / Self Care | Payer: BC Managed Care – PPO | Source: Ambulatory Visit | Attending: Acute Care | Admitting: Acute Care

## 2018-12-13 DIAGNOSIS — Z87891 Personal history of nicotine dependence: Secondary | ICD-10-CM

## 2018-12-13 DIAGNOSIS — Z122 Encounter for screening for malignant neoplasm of respiratory organs: Secondary | ICD-10-CM

## 2018-12-20 ENCOUNTER — Telehealth: Payer: Self-pay | Admitting: Cardiology

## 2018-12-20 DIAGNOSIS — J449 Chronic obstructive pulmonary disease, unspecified: Secondary | ICD-10-CM | POA: Diagnosis not present

## 2018-12-20 NOTE — Telephone Encounter (Signed)
Called patient with no answer. Left voicemail on cell phone per DPR advising most recent CT scan was ordered by pulmonology who will need to provide her with the results. Informed her to contact her pulmonologist and contact our office with any further questions or concerns.

## 2018-12-20 NOTE — Telephone Encounter (Signed)
New Message   Patient calling in for the results for the CT scan. Please give patient a call back with the results.

## 2018-12-21 ENCOUNTER — Telehealth: Payer: Self-pay | Admitting: Acute Care

## 2018-12-21 ENCOUNTER — Other Ambulatory Visit: Payer: Self-pay | Admitting: Pulmonary Disease

## 2018-12-21 DIAGNOSIS — Z122 Encounter for screening for malignant neoplasm of respiratory organs: Secondary | ICD-10-CM

## 2018-12-21 NOTE — Telephone Encounter (Signed)
Notes recorded by Magdalen Spatz, NP on 12/20/2018 at 1:34 PM EDT  Please call patient and let them know their low dose Ct was read as a Lung RADS 2: nodules that are benign in appearance and behavior with a very low likelihood of becoming a clinically active cancer due to size or lack of growth. Recommendation per radiology is for a repeat LDCT in 12 months..Please let them know we will order and schedule their annual screening scan for 11/2019. Please let them know there was notation of CAD on their scan. Please remind the patient that this is a non-gated exam therefore degree or severity of disease cannot be determined. Please have them follow up with their PCP regarding potential risk factor modification, dietary therapy or pharmacologic therapy if clinically indicated.  Pt. is not currently on statin therapy. Please place order for annual screening scan for 11/2019 and fax results to PCP. Thanks so much.  Spoke with the pt and notified of recs per Eric Form, NP  She verbalized understanding  Order placed for LDCT

## 2018-12-25 DIAGNOSIS — J449 Chronic obstructive pulmonary disease, unspecified: Secondary | ICD-10-CM | POA: Diagnosis not present

## 2018-12-25 DIAGNOSIS — R7303 Prediabetes: Secondary | ICD-10-CM | POA: Diagnosis not present

## 2018-12-25 DIAGNOSIS — E785 Hyperlipidemia, unspecified: Secondary | ICD-10-CM | POA: Diagnosis not present

## 2018-12-25 DIAGNOSIS — Z87891 Personal history of nicotine dependence: Secondary | ICD-10-CM | POA: Diagnosis not present

## 2018-12-25 DIAGNOSIS — E559 Vitamin D deficiency, unspecified: Secondary | ICD-10-CM | POA: Diagnosis not present

## 2019-01-01 ENCOUNTER — Telehealth: Payer: Self-pay | Admitting: Pulmonary Disease

## 2019-01-01 DIAGNOSIS — J438 Other emphysema: Secondary | ICD-10-CM

## 2019-01-01 NOTE — Telephone Encounter (Signed)
lmtcb for pt.  

## 2019-01-02 MED ORDER — ALBUTEROL SULFATE (2.5 MG/3ML) 0.083% IN NEBU: 2.5000 mg | INHALATION_SOLUTION | Freq: Four times a day (QID) | RESPIRATORY_TRACT | 5 refills | Status: DC

## 2019-01-02 NOTE — Telephone Encounter (Signed)
Spoke with pt. She is needing a refill on Albuterol Nebulizer Solution. Rx has been sent in to her preferred pharmacy. Nothing further was needed.

## 2019-01-05 ENCOUNTER — Other Ambulatory Visit: Payer: Self-pay | Admitting: Cardiology

## 2019-01-20 DIAGNOSIS — J449 Chronic obstructive pulmonary disease, unspecified: Secondary | ICD-10-CM | POA: Diagnosis not present

## 2019-02-19 DIAGNOSIS — J449 Chronic obstructive pulmonary disease, unspecified: Secondary | ICD-10-CM | POA: Diagnosis not present

## 2019-03-01 ENCOUNTER — Other Ambulatory Visit: Payer: Self-pay | Admitting: Gastroenterology
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
## 2019-03-04 ENCOUNTER — Other Ambulatory Visit: Payer: Self-pay

## 2019-03-04 ENCOUNTER — Encounter: Payer: Self-pay | Admitting: Cardiology

## 2019-03-04 ENCOUNTER — Ambulatory Visit (INDEPENDENT_AMBULATORY_CARE_PROVIDER_SITE_OTHER): Payer: BC Managed Care – PPO | Admitting: Cardiology

## 2019-03-04 VITALS — BP 120/86 | HR 74 | Ht 63.0 in | Wt 175.1 lb

## 2019-03-04 DIAGNOSIS — I2510 Atherosclerotic heart disease of native coronary artery without angina pectoris: Secondary | ICD-10-CM

## 2019-03-04 DIAGNOSIS — G720 Drug-induced myopathy: Secondary | ICD-10-CM

## 2019-03-04 DIAGNOSIS — J9611 Chronic respiratory failure with hypoxia: Secondary | ICD-10-CM

## 2019-03-04 DIAGNOSIS — E7800 Pure hypercholesterolemia, unspecified: Secondary | ICD-10-CM | POA: Diagnosis not present

## 2019-03-04 DIAGNOSIS — T466X5A Adverse effect of antihyperlipidemic and antiarteriosclerotic drugs, initial encounter: Secondary | ICD-10-CM

## 2019-03-04 NOTE — Patient Instructions (Signed)
Medication Instructions:  Your physician recommends that you continue on your current medications as directed. Please refer to the Current Medication list given to you today.  *If you need a refill on your cardiac medications before your next appointment, please call your pharmacy*  Lab Work: None  If you have labs (blood work) drawn today and your tests are completely normal, you will receive your results only by: . MyChart Message (if you have MyChart) OR . A paper copy in the mail If you have any lab test that is abnormal or we need to change your treatment, we will call you to review the results.  Testing/Procedures: You had an EKG today.   Follow-Up: At [facility], you and your health needs are our priority.  As part of our continuing mission to provide you with exceptional heart care, we have created designated Provider Care Teams.  These Care Teams include your primary Cardiologist (physician) and Advanced Practice Providers (APPs -  Physician Assistants and Nurse Practitioners) who all work together to provide you with the care you need, when you need it.  Your next appointment:   12 months  The format for your next appointment:   In Person  Provider:   Brian Munley, MD   

## 2019-03-13 ENCOUNTER — Ambulatory Visit: Payer: BC Managed Care – PPO | Admitting: Cardiology

## 2019-03-22 DIAGNOSIS — J449 Chronic obstructive pulmonary disease, unspecified: Secondary | ICD-10-CM | POA: Diagnosis not present

## 2019-04-04 ENCOUNTER — Ambulatory Visit (INDEPENDENT_AMBULATORY_CARE_PROVIDER_SITE_OTHER): Payer: BC Managed Care – PPO | Admitting: Adult Health

## 2019-04-04 ENCOUNTER — Ambulatory Visit (INDEPENDENT_AMBULATORY_CARE_PROVIDER_SITE_OTHER): Payer: BC Managed Care – PPO

## 2019-04-04 ENCOUNTER — Encounter: Payer: Self-pay | Admitting: Adult Health

## 2019-04-04 ENCOUNTER — Other Ambulatory Visit: Payer: Self-pay

## 2019-04-04 VITALS — BP 126/72 | HR 91 | Temp 98.0°F | Ht 63.0 in | Wt 175.0 lb

## 2019-04-04 DIAGNOSIS — R0602 Shortness of breath: Secondary | ICD-10-CM | POA: Diagnosis not present

## 2019-04-04 DIAGNOSIS — J209 Acute bronchitis, unspecified: Secondary | ICD-10-CM | POA: Diagnosis not present

## 2019-04-04 DIAGNOSIS — J440 Chronic obstructive pulmonary disease with acute lower respiratory infection: Secondary | ICD-10-CM

## 2019-04-04 DIAGNOSIS — J9611 Chronic respiratory failure with hypoxia: Secondary | ICD-10-CM | POA: Diagnosis not present

## 2019-04-04 DIAGNOSIS — R062 Wheezing: Secondary | ICD-10-CM

## 2019-04-04 MED ORDER — PREDNISONE 10 MG PO TABS: ORAL_TABLET | ORAL | 0 refills | Status: DC

## 2019-04-04 MED ORDER — AZITHROMYCIN 250 MG PO TABS: ORAL_TABLET | ORAL | 0 refills | Status: DC

## 2019-04-04 MED ORDER — METHYLPREDNISOLONE ACETATE 80 MG/ML IJ SUSP
120.0000 mg | Freq: Once | INTRAMUSCULAR | Status: AC
  Administered 2019-04-04: 120 mg via INTRAMUSCULAR

## 2019-04-04 NOTE — Patient Instructions (Addendum)
Z-Pak take as directed Mucinex DM twice daily as needed for cough and congestion Prednisone taper over the next week-began tomorrow April 05, 2019 Continue on Trelegy 1 puff daily, rinse after use Depo-Medrol injection today in the office COVID-19 testing-at [place] or at your work Please self quarantine at home and social distance until test results are available Fluids and rest Activity as tolerated Continue on oxygen 2 L at bedtime May use albuterol inhaler or nebulizer as needed for wheezing shortness of breath If symptoms worsen or do not improve please contact our office immediately Follow up with Dr. [name] in 4 weeks and As needed   Please contact office for sooner follow up if symptoms do not improve or worsen or seek emergency care
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
## 2019-04-05 ENCOUNTER — Other Ambulatory Visit: Payer: Self-pay

## 2019-04-05 DIAGNOSIS — Z20822 Contact with and (suspected) exposure to covid-19: Secondary | ICD-10-CM

## 2019-04-05 NOTE — Assessment & Plan Note (Signed)
[place]  Check COVID testing  Depo medrol 120mg  IM   Plan  Patient Instructions  Z-Pak take as directed Mucinex DM twice daily as needed for cough and congestion Prednisone taper over the next week-began tomorrow April 05, 2019 Continue on Trelegy 1 puff daily, rinse after use Depo-Medrol injection today in the office COVID-19 testing-at [place] or at your work Please self quarantine at home and social distance until test results are available Fluids and rest Activity as tolerated Continue on oxygen 2 L at bedtime May use albuterol inhaler or nebulizer as needed for wheezing shortness of breath If symptoms worsen or do not improve please contact our office immediately Follow up with Dr. [name] in 4 weeks and As needed   Please contact office for sooner follow up if symptoms do not improve or worsen or seek emergency care

## 2019-04-05 NOTE — Assessment & Plan Note (Signed)
Cont on O2 At bedtime  .  o2 sats adequate on room air currently

## 2019-04-07 ENCOUNTER — Encounter (HOSPITAL_COMMUNITY): Payer: Self-pay

## 2019-04-07 ENCOUNTER — Emergency Department (HOSPITAL_COMMUNITY): Payer: BC Managed Care – PPO

## 2019-04-07 ENCOUNTER — Emergency Department (HOSPITAL_COMMUNITY)
Admission: EM | Admit: 2019-04-07 | Discharge: 2019-04-07 | Disposition: A | Discharge status: Home / Self Care | Payer: BC Managed Care – PPO | Attending: Emergency Medicine | Admitting: Emergency Medicine

## 2019-04-07 ENCOUNTER — Other Ambulatory Visit: Payer: Self-pay

## 2019-04-07 DIAGNOSIS — R0602 Shortness of breath: Secondary | ICD-10-CM | POA: Diagnosis present

## 2019-04-07 DIAGNOSIS — U071 COVID-19: Secondary | ICD-10-CM | POA: Diagnosis not present

## 2019-04-07 DIAGNOSIS — I2510 Atherosclerotic heart disease of native coronary artery without angina pectoris: Secondary | ICD-10-CM | POA: Diagnosis not present

## 2019-04-07 DIAGNOSIS — Z79899 Other long term (current) drug therapy: Secondary | ICD-10-CM | POA: Diagnosis not present

## 2019-04-07 DIAGNOSIS — J449 Chronic obstructive pulmonary disease, unspecified: Secondary | ICD-10-CM | POA: Diagnosis not present

## 2019-04-07 DIAGNOSIS — Z87891 Personal history of nicotine dependence: Secondary | ICD-10-CM | POA: Insufficient documentation

## 2019-04-07 DIAGNOSIS — Z7902 Long term (current) use of antithrombotics/antiplatelets: Secondary | ICD-10-CM | POA: Diagnosis not present

## 2019-04-07 LAB — COMPREHENSIVE METABOLIC PANEL
ALT: 14 U/L (ref 0–44)
AST: 19 U/L (ref 15–41)
Albumin: 4 g/dL (ref 3.5–5.0)
Alkaline Phosphatase: 50 U/L (ref 38–126)
Anion gap: 10 (ref 5–15)
BUN: 9 mg/dL (ref 6–20)
CO2: 28 mmol/L (ref 22–32)
Calcium: 8.6 mg/dL — ABNORMAL LOW (ref 8.9–10.3)
Chloride: 100 mmol/L (ref 98–111)
Creatinine, Ser: 0.69 mg/dL (ref 0.44–1.00)
GFR calc Af Amer: >60 mL/min (ref >60)
GFR calc non Af Amer: >60 mL/min (ref >60)
Glucose, Bld: 91 mg/dL (ref 70–99)
Potassium: 3.5 mmol/L (ref 3.5–5.1)
Sodium: 138 mmol/L (ref 135–145)
Total Bilirubin: 0.3 mg/dL (ref 0.3–1.2)
Total Protein: 7.2 g/dL (ref 6.5–8.1)

## 2019-04-07 LAB — CBC WITH DIFFERENTIAL/PLATELET
Abs Immature Granulocytes: 0.01 10*3/uL (ref 0.00–0.07)
Basophils Absolute: 0 10*3/uL (ref 0.0–0.1)
Basophils Relative: 0 %
Eosinophils Absolute: 0 10*3/uL (ref 0.0–0.5)
Eosinophils Relative: 0 %
HCT: 38 % (ref 36.0–46.0)
Hemoglobin: 11.9 g/dL — ABNORMAL LOW (ref 12.0–15.0)
Immature Granulocytes: 0 %
Lymphocytes Relative: 9 %
Lymphs Abs: 0.6 10*3/uL — ABNORMAL LOW (ref 0.7–4.0)
MCH: 28.7 pg (ref 26.0–34.0)
MCHC: 31.3 g/dL (ref 30.0–36.0)
MCV: 91.6 fL (ref 80.0–100.0)
Monocytes Absolute: 0.4 10*3/uL (ref 0.1–1.0)
Monocytes Relative: 6 %
Neutro Abs: 5.5 10*3/uL (ref 1.7–7.7)
Neutrophils Relative %: 85 %
Platelets: 229 10*3/uL (ref 150–400)
RBC: 4.15 MIL/uL (ref 3.87–5.11)
RDW: 14.1 % (ref 11.5–15.5)
WBC: 6.6 10*3/uL (ref 4.0–10.5)
nRBC: 0 % (ref 0.0–0.2)

## 2019-04-07 LAB — POC SARS CORONAVIRUS 2 AG -  ED: SARS Coronavirus 2 Ag: POSITIVE — AB

## 2019-04-07 LAB — NOVEL CORONAVIRUS, NAA: SARS-CoV-2, NAA: DETECTED — AB

## 2019-04-07 MED ORDER — PREDNISONE 20 MG PO TABS: 40.0000 mg | ORAL_TABLET | Freq: Every day | ORAL | 0 refills | Status: DC

## 2019-04-07 MED ORDER — ALBUTEROL SULFATE HFA 108 (90 BASE) MCG/ACT IN AERS
6.0000 | INHALATION_SPRAY | Freq: Once | RESPIRATORY_TRACT | Status: AC
  Administered 2019-04-07: 6 via RESPIRATORY_TRACT
  Filled 2019-04-07: qty 6.7

## 2019-04-07 MED ORDER — DEXAMETHASONE SODIUM PHOSPHATE 10 MG/ML IJ SOLN
10.0000 mg | Freq: Once | INTRAMUSCULAR | Status: AC
  Administered 2019-04-07: 12:00:00 10 mg via INTRAVENOUS
  Filled 2019-04-07: qty 1

## 2019-04-07 MED ORDER — ACETAMINOPHEN 325 MG PO TABS
650.0000 mg | ORAL_TABLET | Freq: Once | ORAL | Status: AC
  Administered 2019-04-07: 650 mg via ORAL
  Filled 2019-04-07: qty 2

## 2019-04-07 MED ORDER — ALBUTEROL SULFATE HFA 108 (90 BASE) MCG/ACT IN AERS
4.0000 | INHALATION_SPRAY | Freq: Once | RESPIRATORY_TRACT | Status: AC
  Administered 2019-04-07: 4 via RESPIRATORY_TRACT

## 2019-04-07 MED ORDER — SODIUM CHLORIDE 0.9 % IV BOLUS
1000.0000 mL | Freq: Once | INTRAVENOUS | Status: AC
  Administered 2019-04-07: 1000 mL via INTRAVENOUS

## 2019-04-07 NOTE — ED Triage Notes (Signed)
Pt has hx of COPD, always wears 3 L of oxygen. Pt is wheezing and tripoding throughout triage. Pt has had fever at home of 101.7. Pt has attempted to use her nebulizer and inhaler but has not had any relief.

## 2019-04-07 NOTE — ED Notes (Signed)
Patient maintained a SPO2 of 95-98% while ambulating in room.
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
## 2019-04-07 NOTE — Discharge Instructions (Signed)
As we discussed, your Covid test did come back positive.  You need to make sure you are taking Tylenol and ibuprofen as needed for fever.  If you do not feel that you tolerate ibuprofen, just take Tylenol.  Make sure you drink plenty fluids and staying hydrated.  Use albuterol inhalers as directed.  Take prednisone as directed.  You will need to quarantine for 2 weeks.  Return the emergency department for any difficulty breathing, vomiting or any other worsening concerning symptoms.     Person Under Monitoring Name: [name]  Location: [address] [place] [address]   Infection Prevention Recommendations for Individuals Confirmed to have, or Being Evaluated for, 2019 Novel Coronavirus (COVID-19) Infection Who Receive Care at Home  Individuals who are confirmed to have, or are being evaluated for, COVID-19 should follow the prevention steps below until a healthcare provider or local or state health department says they can return to normal activities.  Stay home except to get medical care You should restrict activities outside your home, except for getting medical care. Do not go to work, school, or public areas, and do not use public transportation or taxis.  Call ahead before visiting your doctor Before your medical appointment, call the healthcare provider and tell them that you have, or are being evaluated for, COVID-19 infection. This will help the healthcare providers office take steps to keep other people from getting infected. Ask your healthcare provider to call the local or state health department.  Monitor your symptoms Seek prompt medical attention if your illness is worsening (e.g., difficulty breathing). Before going to your medical appointment, call the healthcare provider and tell them that you have, or are being evaluated for, COVID-19 infection. Ask your healthcare provider to call the local or state health department.  Wear a  facemask You should wear a facemask that covers your nose and mouth when you are in the same room with other people and when you visit a healthcare provider. People who live with or visit you should also wear a facemask while they are in the same room with you.  Separate yourself from other people in your home As much as possible, you should stay in a different room from other people in your home. Also, you should use a separate bathroom, if available.  Avoid sharing household items You should not share dishes, drinking glasses, cups, eating utensils, towels, bedding, or other items with other people in your home. After using these items, you should wash them thoroughly with soap and water.  Cover your coughs and sneezes Cover your mouth and nose with a tissue when you cough or sneeze, or you can cough or sneeze into your sleeve. Throw used tissues in a lined trash can, and immediately wash your hands with soap and water for at least 20 seconds or use an alcohol-based hand rub.  Wash your [facility] your hands often and thoroughly with soap and water for at least 20 seconds. You can use an alcohol-based hand sanitizer if soap and water are not available and if your hands are not visibly dirty. Avoid touching your eyes, nose, and mouth with unwashed hands.   Prevention Steps for Caregivers and Household Members of Individuals Confirmed to have, or Being Evaluated for, COVID-19 Infection Being Cared for in the Home  If you live with, or provide care at home for, a person confirmed to have, or being evaluated for, COVID-19 infection please follow these guidelines to prevent infection:  Follow  healthcare providers instructions Make sure that you understand and can help the patient follow any healthcare provider instructions for all care.  Provide for the patients basic needs You should help the patient with basic needs in the home and provide support for getting groceries,  prescriptions, and other personal needs.  Monitor the patients symptoms If they are getting sicker, call his or her medical provider and tell them that the patient has, or is being evaluated for, COVID-19 infection. This will help the healthcare providers office take steps to keep other people from getting infected. Ask the healthcare provider to call the local or state health department.  Limit the number of people who have contact with the patient If possible, have only one caregiver for the patient. Other household members should stay in another home or place of residence. If this is not possible, they should stay in another room, or be separated from the patient as much as possible. Use a separate bathroom, if available. Restrict visitors who do not have an essential need to be in the home.  Keep older adults, very young children, and other sick people away from the patient Keep older adults, very young children, and those who have compromised immune systems or chronic health conditions away from the patient. This includes people with chronic heart, lung, or kidney conditions, diabetes, and cancer.  Ensure good ventilation Make sure that shared spaces in the home have good air flow, such as from an air conditioner or an opened window, weather permitting.  Wash your hands often Wash your hands often and thoroughly with soap and water for at least 20 seconds. You can use an alcohol based hand sanitizer if soap and water are not available and if your hands are not visibly dirty. Avoid touching your eyes, nose, and mouth with unwashed hands. Use disposable paper towels to dry your hands. If not available, use dedicated cloth towels and replace them when they become wet.  Wear a facemask and gloves Wear a disposable facemask at all times in the room and gloves when you touch or have contact with the patients blood, body fluids, and/or secretions or excretions, such as sweat, saliva,  sputum, nasal mucus, vomit, urine, or feces.  Ensure the mask fits over your nose and mouth tightly, and do not touch it during use. Throw out disposable facemasks and gloves after using them. Do not reuse. Wash your hands immediately after removing your facemask and gloves. If your personal clothing becomes contaminated, carefully remove clothing and launder. Wash your hands after handling contaminated clothing. Place all used disposable facemasks, gloves, and other waste in a lined container before disposing them with other household waste. Remove gloves and wash your hands immediately after handling these items.  Do not share dishes, glasses, or other household items with the patient Avoid sharing household items. You should not share dishes, drinking glasses, cups, eating utensils, towels, bedding, or other items with a patient who is confirmed to have, or being evaluated for, COVID-19 infection. After the person uses these items, you should wash them thoroughly with soap and water.  Wash laundry thoroughly Immediately remove and wash clothes or bedding that have blood, body fluids, and/or secretions or excretions, such as sweat, saliva, sputum, nasal mucus, vomit, urine, or feces, on them. Wear gloves when handling laundry from the patient. Read and follow directions on labels of laundry or clothing items and detergent. In general, wash and dry with the warmest temperatures recommended on the label.  Clean  all areas the individual has used often Clean all touchable surfaces, such as counters, tabletops, doorknobs, bathroom fixtures, toilets, phones, keyboards, tablets, and bedside tables, every day. Also, clean any surfaces that may have blood, body fluids, and/or secretions or excretions on them. Wear gloves when cleaning surfaces the patient has come in contact with. Use a diluted bleach solution (e.g., dilute bleach with 1 part bleach and 10 parts water) or a household disinfectant with a  label that says EPA-registered for coronaviruses. To make a bleach solution at home, add 1 tablespoon of bleach to 1 quart (4 cups) of water. For a larger supply, add  cup of bleach to 1 gallon (16 cups) of water. Read labels of cleaning products and follow recommendations provided on product labels. Labels contain instructions for safe and effective use of the cleaning product including precautions you should take when applying the product, such as wearing gloves or eye protection and making sure you have good ventilation during use of the product. Remove gloves and wash hands immediately after cleaning.  Monitor yourself for signs and symptoms of illness Caregivers and household members are considered close contacts, should monitor their health, and will be asked to limit movement outside of the home to the extent possible. Follow the monitoring steps for close contacts listed on the symptom monitoring form.   ? If you have additional questions, contact your local health department or call the epidemiologist on call at [phone] (available 24/7). ? This guidance is subject to change. For the most up-to-date guidance from [facility], please refer to their website: YouBlogs.pl

## 2019-04-09 ENCOUNTER — Other Ambulatory Visit: Payer: Self-pay | Admitting: Critical Care Medicine

## 2019-04-09 ENCOUNTER — Inpatient Hospital Stay (HOSPITAL_COMMUNITY)
Admission: EM | Admit: 2019-04-09 | Discharge: 2019-04-14 | DRG: 177 | Disposition: A | Discharge status: Home / Self Care | Payer: BC Managed Care – PPO | Attending: Internal Medicine | Admitting: Internal Medicine

## 2019-04-09 ENCOUNTER — Emergency Department (HOSPITAL_COMMUNITY): Payer: BC Managed Care – PPO

## 2019-04-09 ENCOUNTER — Ambulatory Visit: Payer: Self-pay | Admitting: *Deleted

## 2019-04-09 ENCOUNTER — Ambulatory Visit (HOSPITAL_COMMUNITY): Payer: BC Managed Care – PPO

## 2019-04-09 ENCOUNTER — Encounter (HOSPITAL_COMMUNITY): Payer: Self-pay

## 2019-04-09 ENCOUNTER — Other Ambulatory Visit: Payer: Self-pay

## 2019-04-09 DIAGNOSIS — Z9981 Dependence on supplemental oxygen: Secondary | ICD-10-CM | POA: Diagnosis not present

## 2019-04-09 DIAGNOSIS — J1289 Other viral pneumonia: Secondary | ICD-10-CM | POA: Diagnosis present

## 2019-04-09 DIAGNOSIS — K219 Gastro-esophageal reflux disease without esophagitis: Secondary | ICD-10-CM | POA: Diagnosis not present

## 2019-04-09 DIAGNOSIS — E876 Hypokalemia: Secondary | ICD-10-CM | POA: Diagnosis not present

## 2019-04-09 DIAGNOSIS — R0602 Shortness of breath: Secondary | ICD-10-CM | POA: Diagnosis not present

## 2019-04-09 DIAGNOSIS — Z823 Family history of stroke: Secondary | ICD-10-CM | POA: Diagnosis not present

## 2019-04-09 DIAGNOSIS — Z8249 Family history of ischemic heart disease and other diseases of the circulatory system: Secondary | ICD-10-CM | POA: Diagnosis not present

## 2019-04-09 DIAGNOSIS — I25118 Atherosclerotic heart disease of native coronary artery with other forms of angina pectoris: Secondary | ICD-10-CM

## 2019-04-09 DIAGNOSIS — Z7951 Long term (current) use of inhaled steroids: Secondary | ICD-10-CM | POA: Diagnosis not present

## 2019-04-09 DIAGNOSIS — Z825 Family history of asthma and other chronic lower respiratory diseases: Secondary | ICD-10-CM

## 2019-04-09 DIAGNOSIS — J9621 Acute and chronic respiratory failure with hypoxia: Secondary | ICD-10-CM | POA: Diagnosis not present

## 2019-04-09 DIAGNOSIS — J441 Chronic obstructive pulmonary disease with (acute) exacerbation: Secondary | ICD-10-CM

## 2019-04-09 DIAGNOSIS — F1910 Other psychoactive substance abuse, uncomplicated: Secondary | ICD-10-CM | POA: Diagnosis present

## 2019-04-09 DIAGNOSIS — I2510 Atherosclerotic heart disease of native coronary artery without angina pectoris: Secondary | ICD-10-CM | POA: Diagnosis present

## 2019-04-09 DIAGNOSIS — J1282 Pneumonia due to coronavirus disease 2019: Secondary | ICD-10-CM | POA: Diagnosis present

## 2019-04-09 DIAGNOSIS — U071 COVID-19: Secondary | ICD-10-CM | POA: Diagnosis not present

## 2019-04-09 DIAGNOSIS — R4702 Dysphasia: Secondary | ICD-10-CM | POA: Diagnosis not present

## 2019-04-09 DIAGNOSIS — Z743 Need for continuous supervision: Secondary | ICD-10-CM | POA: Diagnosis not present

## 2019-04-09 DIAGNOSIS — Z79899 Other long term (current) drug therapy: Secondary | ICD-10-CM

## 2019-04-09 DIAGNOSIS — F1411 Cocaine abuse, in remission: Secondary | ICD-10-CM | POA: Diagnosis present

## 2019-04-09 DIAGNOSIS — Z7902 Long term (current) use of antithrombotics/antiplatelets: Secondary | ICD-10-CM | POA: Diagnosis not present

## 2019-04-09 DIAGNOSIS — K5900 Constipation, unspecified: Secondary | ICD-10-CM | POA: Diagnosis present

## 2019-04-09 DIAGNOSIS — R069 Unspecified abnormalities of breathing: Secondary | ICD-10-CM | POA: Diagnosis not present

## 2019-04-09 DIAGNOSIS — J449 Chronic obstructive pulmonary disease, unspecified: Secondary | ICD-10-CM | POA: Diagnosis present

## 2019-04-09 DIAGNOSIS — J440 Chronic obstructive pulmonary disease with acute lower respiratory infection: Secondary | ICD-10-CM | POA: Diagnosis present

## 2019-04-09 DIAGNOSIS — E785 Hyperlipidemia, unspecified: Secondary | ICD-10-CM | POA: Diagnosis present

## 2019-04-09 DIAGNOSIS — J9611 Chronic respiratory failure with hypoxia: Secondary | ICD-10-CM

## 2019-04-09 DIAGNOSIS — R0689 Other abnormalities of breathing: Secondary | ICD-10-CM | POA: Diagnosis not present

## 2019-04-09 LAB — BLOOD GAS, ARTERIAL
Acid-Base Excess: 3.5 mmol/L — ABNORMAL HIGH (ref 0.0–2.0)
Allens test (pass/fail): POSITIVE — AB
Bicarbonate: 28.7 mmol/L — ABNORMAL HIGH (ref 20.0–28.0)
Drawn by: 33417
FIO2: 100
O2 Saturation: 98 %
Patient temperature: 98.6
pCO2 arterial: 49.1 mmHg — ABNORMAL HIGH (ref 32.0–48.0)
pH, Arterial: 7.384 (ref 7.350–7.450)
pO2, Arterial: 114 mmHg — ABNORMAL HIGH (ref 83.0–108.0)

## 2019-04-09 LAB — D-DIMER, QUANTITATIVE: D-Dimer, Quant: 0.27 ug/mL-FEU (ref 0.00–0.50)

## 2019-04-09 LAB — CBC WITH DIFFERENTIAL/PLATELET
Abs Immature Granulocytes: 0.02 10*3/uL (ref 0.00–0.07)
Basophils Absolute: 0 10*3/uL (ref 0.0–0.1)
Basophils Relative: 0 %
Eosinophils Absolute: 0 10*3/uL (ref 0.0–0.5)
Eosinophils Relative: 0 %
HCT: 34.9 % — ABNORMAL LOW (ref 36.0–46.0)
Hemoglobin: 10.7 g/dL — ABNORMAL LOW (ref 12.0–15.0)
Immature Granulocytes: 0 %
Lymphocytes Relative: 8 %
Lymphs Abs: 0.5 10*3/uL — ABNORMAL LOW (ref 0.7–4.0)
MCH: 28.5 pg (ref 26.0–34.0)
MCHC: 30.7 g/dL (ref 30.0–36.0)
MCV: 92.8 fL (ref 80.0–100.0)
Monocytes Absolute: 0.2 10*3/uL (ref 0.1–1.0)
Monocytes Relative: 4 %
Neutro Abs: 5.4 10*3/uL (ref 1.7–7.7)
Neutrophils Relative %: 88 %
Platelets: 212 10*3/uL (ref 150–400)
RBC: 3.76 MIL/uL — ABNORMAL LOW (ref 3.87–5.11)
RDW: 14.2 % (ref 11.5–15.5)
WBC: 6.1 10*3/uL (ref 4.0–10.5)
nRBC: 0 % (ref 0.0–0.2)

## 2019-04-09 LAB — COMPREHENSIVE METABOLIC PANEL
ALT: 12 U/L (ref 0–44)
AST: 16 U/L (ref 15–41)
Albumin: 2.8 g/dL — ABNORMAL LOW (ref 3.5–5.0)
Alkaline Phosphatase: 38 U/L (ref 38–126)
Anion gap: 8 (ref 5–15)
BUN: 5 mg/dL — ABNORMAL LOW (ref 6–20)
CO2: 25 mmol/L (ref 22–32)
Calcium: 6.9 mg/dL — ABNORMAL LOW (ref 8.9–10.3)
Chloride: 105 mmol/L (ref 98–111)
Creatinine, Ser: 0.51 mg/dL (ref 0.44–1.00)
GFR calc Af Amer: >60 mL/min (ref >60)
GFR calc non Af Amer: >60 mL/min (ref >60)
Glucose, Bld: 129 mg/dL — ABNORMAL HIGH (ref 70–99)
Potassium: 2.3 mmol/L — CL (ref 3.5–5.1)
Sodium: 138 mmol/L (ref 135–145)
Total Bilirubin: 0.3 mg/dL (ref 0.3–1.2)
Total Protein: 5.4 g/dL — ABNORMAL LOW (ref 6.5–8.1)

## 2019-04-09 LAB — TRIGLYCERIDES: Triglycerides: 83 mg/dL (ref <150)

## 2019-04-09 LAB — C-REACTIVE PROTEIN: CRP: 5.5 mg/dL — ABNORMAL HIGH (ref <1.0)

## 2019-04-09 LAB — LACTATE DEHYDROGENASE: LDH: 213 U/L — ABNORMAL HIGH (ref 98–192)

## 2019-04-09 LAB — FERRITIN: Ferritin: 80 ng/mL (ref 11–307)

## 2019-04-09 LAB — FIBRINOGEN: Fibrinogen: 508 mg/dL — ABNORMAL HIGH (ref 210–475)

## 2019-04-09 LAB — HCG, QUANTITATIVE, PREGNANCY: hCG, Beta Chain, Quant, S: 1 m[IU]/mL (ref <5)

## 2019-04-09 LAB — LACTIC ACID, PLASMA: Lactic Acid, Venous: 0.8 mmol/L (ref 0.5–1.9)

## 2019-04-09 LAB — PROCALCITONIN: Procalcitonin: 0.11 ng/mL

## 2019-04-09 MED ORDER — SODIUM CHLORIDE 0.9 % IV SOLN
500.0000 mg | INTRAVENOUS | Status: DC
  Administered 2019-04-09: 500 mg via INTRAVENOUS
  Filled 2019-04-09: qty 500

## 2019-04-09 MED ORDER — SODIUM CHLORIDE 0.9 % IV SOLN
100.0000 mg | Freq: Every day | INTRAVENOUS | Status: AC
  Administered 2019-04-11 – 2019-04-13 (×3): 100 mg via INTRAVENOUS
  Filled 2019-04-09 (×3): qty 100

## 2019-04-09 MED ORDER — DEXAMETHASONE SODIUM PHOSPHATE 10 MG/ML IJ SOLN: 10.0000 mg | Freq: Once | INTRAMUSCULAR | Status: DC

## 2019-04-09 MED ORDER — SODIUM CHLORIDE 0.9 % IV SOLN: INTRAVENOUS | Status: DC | PRN

## 2019-04-09 MED ORDER — ENOXAPARIN SODIUM 40 MG/0.4ML ~~LOC~~ SOLN
40.0000 mg | SUBCUTANEOUS | Status: DC
  Administered 2019-04-09 – 2019-04-13 (×5): 40 mg via SUBCUTANEOUS
  Filled 2019-04-09 (×6): qty 0.4

## 2019-04-09 MED ORDER — DEXAMETHASONE SODIUM PHOSPHATE 10 MG/ML IJ SOLN
6.0000 mg | INTRAMUSCULAR | Status: DC
  Administered 2019-04-09: 6 mg via INTRAVENOUS
  Filled 2019-04-09: qty 1

## 2019-04-09 MED ORDER — SODIUM CHLORIDE 0.9 % IV SOLN
200.0000 mg | Freq: Once | INTRAVENOUS | Status: AC
  Administered 2019-04-09: 200 mg via INTRAVENOUS
  Filled 2019-04-09: qty 40

## 2019-04-09 MED ORDER — SODIUM CHLORIDE 0.9 % IV SOLN
1000.0000 mL | INTRAVENOUS | Status: DC
  Administered 2019-04-09: 1000 mL via INTRAVENOUS

## 2019-04-09 MED ORDER — SODIUM CHLORIDE 0.9 % IV SOLN
100.0000 mg | Freq: Once | INTRAVENOUS | Status: AC
  Administered 2019-04-10: 100 mg via INTRAVENOUS
  Filled 2019-04-09: qty 100

## 2019-04-09 MED ORDER — SODIUM CHLORIDE 0.9 % IV SOLN
2.0000 g | INTRAVENOUS | Status: DC
  Administered 2019-04-09: 2 g via INTRAVENOUS
  Filled 2019-04-09: qty 20

## 2019-04-09 MED ORDER — ALBUTEROL SULFATE HFA 108 (90 BASE) MCG/ACT IN AERS
4.0000 | INHALATION_SPRAY | Freq: Once | RESPIRATORY_TRACT | Status: AC
  Administered 2019-04-09: 4 via RESPIRATORY_TRACT
  Filled 2019-04-09: qty 6.7

## 2019-04-09 MED ORDER — POTASSIUM CHLORIDE 10 MEQ/100ML IV SOLN
10.0000 meq | Freq: Once | INTRAVENOUS | Status: AC
  Administered 2019-04-09: 10 meq via INTRAVENOUS
  Filled 2019-04-09: qty 100

## 2019-04-09 MED ORDER — POTASSIUM CHLORIDE CRYS ER 20 MEQ PO TBCR
60.0000 meq | EXTENDED_RELEASE_TABLET | Freq: Once | ORAL | Status: AC
  Administered 2019-04-09: 60 meq via ORAL
  Filled 2019-04-09: qty 3

## 2019-04-09 NOTE — ED Notes (Signed)
[facility] called for transport to room 141 at [facility].

## 2019-04-09 NOTE — ED Notes (Signed)
Pt placed on purewick 

## 2019-04-09 NOTE — Progress Notes (Signed)
  I connected by phone with [name] on 04/09/2019 at 7:52 AM to discuss the potential use of an new treatment for mild to moderate COVID-19 viral infection in non-hospitalized patients.  This patient is a 58 y.o. female that meets the FDA criteria for Emergency Use Authorization of bamlanivimab:  Has a (+) direct SARS-CoV-2 viral test result  Has mild or moderate COVID-19   Is ? 60 years of age and weighs ? 40 kg  Is NOT hospitalized due to COVID-19  Is NOT requiring oxygen therapy or requiring an increase in baseline oxygen flow rate due to COVID-19  Is within 10 days of symptom onset  Has at least one of the high risk factor(s) for progression to severe COVID-19 and/or hospitalization as defined in EUA.  Specific high risk criteria : COPD CAD Chronic resp failure  Patient Active Problem List   Diagnosis Date Noted  . Acute bronchitis with COPD ([place]) 05/07/2018  . Obesity (BMI 30-39.9) 02/19/2018  . Chronic respiratory failure ([place]) 02/19/2018  . CAD (coronary artery disease) 02/19/2018  . Hyperlipidemia 02/19/2018  . Hepatitis C antibody test positive 10/27/2014  . History of cocaine abuse ([place]) 09/09/2012  . GERD (gastroesophageal reflux disease)   . Substance abuse ([place])   . Hiatal hernia   . Chronic headache   . COPD (chronic obstructive pulmonary disease) ([place]) 11/14/2011  . Nocturnal hypoxia 11/14/2011    I have spoken and communicated the following to the patient or parent/caregiver:  1. FDA has authorized the emergency use of bamlanivimab for the treatment of mild to moderate COVID-19 in adults and pediatric patients with positive results of direct SARS-CoV-2 viral testing who are 44 years of age and older weighing at least 40 kg, and who are at high risk for progressing to severe COVID-19 and/or hospitalization.  2. The significant known and potential risks and benefits of bamlanivimab, and the extent to which such potential risks and benefits are  unknown.  3. Information on available alternative treatments and the risks and benefits of those alternatives, including clinical trials.  4. Patients treated with bamlanivimab should continue to self-isolate and use infection control measures (e.g., wear mask, isolate, social distance, avoid sharing personal items, clean and disinfect "high touch" surfaces, and frequent handwashing) according to CDC guidelines.   5. The patient or parent/caregiver has the option to accept or refuse bamlanivimab.  After reviewing this information with the patient, The patient agreed to proceed with receiving the infusion of bamlanivimab and will be provided a copy of the Fact sheet prior to receiving the infusion.  [name] 04/09/2019 7:52 AM

## 2019-04-09 NOTE — Telephone Encounter (Signed)
Pt's son, Kathrin Greathouse, called stating the pt was seen in ED on 04/07/2019 and tested positive for COVID; her temp has been 99.3-99.9 today; the pt says she "feels really, really cold"; the pt says also says she has "no strength, and her breathing is off"; the pt says that she is having Shortness of breath; the pt has COPD; recommendations made per nurse triage protocol;  pt's son transferred to 911  Reason for Disposition . SEVERE difficulty breathing (e.g., struggling for each breath, speaks in single words)  Answer Assessment - Initial Assessment Questions 1. COVID-19 DIAGNOSIS: "Who made your Coronavirus (COVID-19) diagnosis?" "Was it confirmed by a positive lab test?" If not diagnosed by a HCP, ask "Are there lots of cases (community spread) where you live?" (See public health department website, if unsure)     Seen in ED 06/06/2018 2. COVID-19 EXPOSURE: "Was there any known exposure to COVID before the symptoms began?" CDC Definition of close contact: within 6 feet (2 meters) for a total of 15 minutes or more over a 24-hour period.       3. ONSET: "When did the COVID-19 symptoms start?"      4. WORST SYMPTOM: "What is your worst symptom?" (e.g., cough, fever, shortness of breath, muscle aches)   Shortness of breath 5. COUGH: "Do you have a cough?" If so, ask: "How bad is the cough?"        6. FEVER: "Do you have a fever?" If so, ask: "What is your temperature, how was it measured, and when did it start?"     Temp [phone] 7. RESPIRATORY STATUS: "Describe your breathing?" (e.g., shortness of breath, wheezing, unable to speak)      Short of breath 8. BETTER-SAME-WORSE: "Are you getting better, staying the same or getting worse compared to yesterday?"  If getting worse, ask, "In what way?"    worse 9. HIGH RISK DISEASE: "Do you have any chronic medical problems?" (e.g., asthma, heart or lung disease, weak immune system, obesity, etc.)   COPD 10. PREGNANCY: "Is there any chance you are pregnant?"  "When was your last menstrual period?"       11. OTHER SYMPTOMS: "Do you have any other symptoms?"  (e.g., chills, fatigue, headache, loss of smell or taste, muscle pain, sore throat; new loss of smell or taste especially support the diagnosis of COVID-19)       Weakness, feels "really cold"  Protocols used: CORONAVIRUS (COVID-19) DIAGNOSED OR SUSPECTED-A-AH

## 2019-04-09 NOTE — ED Triage Notes (Signed)
Pt arrives [facility] from home with c/o of shortness of breath. COVID+ on Sunday from ER. Pt has Hx of COPD. Pt reports SHOB began this am. EMS reports wheezing in all fields. EMS administered 10mg  albuterol, 0.5mg  Atrovent INH, 125mg  Solumedrol. 2G Mag. Pt had RA sat of 73% with fire. Pt placed on NRB and at 100%

## 2019-04-09 NOTE — ED Notes (Signed)
Provider notified of Lab value K=2.3
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
## 2019-04-09 NOTE — ED Notes (Signed)
Date and time results received: 04/09/19 1807 (use smartphrase ".now" to insert current time)  Test: Potassium Potassium Critical Value: 2.3  Name of Provider Notified: Zenia Resides, MD  Orders Received? Or Actions Taken?: Orders Received - See Orders for details

## 2019-04-10 ENCOUNTER — Encounter (HOSPITAL_COMMUNITY): Payer: Self-pay

## 2019-04-10 ENCOUNTER — Other Ambulatory Visit: Payer: Self-pay

## 2019-04-10 ENCOUNTER — Ambulatory Visit (HOSPITAL_COMMUNITY): Payer: BC Managed Care – PPO

## 2019-04-10 DIAGNOSIS — K219 Gastro-esophageal reflux disease without esophagitis: Secondary | ICD-10-CM

## 2019-04-10 DIAGNOSIS — I25118 Atherosclerotic heart disease of native coronary artery with other forms of angina pectoris: Secondary | ICD-10-CM

## 2019-04-10 DIAGNOSIS — J441 Chronic obstructive pulmonary disease with (acute) exacerbation: Secondary | ICD-10-CM

## 2019-04-10 LAB — COMPREHENSIVE METABOLIC PANEL
ALT: 13 U/L (ref 0–44)
AST: 18 U/L (ref 15–41)
Albumin: 3.2 g/dL — ABNORMAL LOW (ref 3.5–5.0)
Alkaline Phosphatase: 40 U/L (ref 38–126)
Anion gap: 9 (ref 5–15)
BUN: 7 mg/dL (ref 6–20)
CO2: 28 mmol/L (ref 22–32)
Calcium: 7.9 mg/dL — ABNORMAL LOW (ref 8.9–10.3)
Chloride: 103 mmol/L (ref 98–111)
Creatinine, Ser: 0.61 mg/dL (ref 0.44–1.00)
GFR calc Af Amer: >60 mL/min (ref >60)
GFR calc non Af Amer: >60 mL/min (ref >60)
Glucose, Bld: 157 mg/dL — ABNORMAL HIGH (ref 70–99)
Potassium: 3.8 mmol/L (ref 3.5–5.1)
Sodium: 140 mmol/L (ref 135–145)
Total Bilirubin: 0.5 mg/dL (ref 0.3–1.2)
Total Protein: 6.4 g/dL — ABNORMAL LOW (ref 6.5–8.1)

## 2019-04-10 LAB — CBC WITH DIFFERENTIAL/PLATELET
Abs Immature Granulocytes: 0.01 10*3/uL (ref 0.00–0.07)
Basophils Absolute: 0 10*3/uL (ref 0.0–0.1)
Basophils Relative: 0 %
Eosinophils Absolute: 0 10*3/uL (ref 0.0–0.5)
Eosinophils Relative: 0 %
HCT: 33.4 % — ABNORMAL LOW (ref 36.0–46.0)
Hemoglobin: 10.3 g/dL — ABNORMAL LOW (ref 12.0–15.0)
Immature Granulocytes: 0 %
Lymphocytes Relative: 6 %
Lymphs Abs: 0.3 10*3/uL — ABNORMAL LOW (ref 0.7–4.0)
MCH: 28.4 pg (ref 26.0–34.0)
MCHC: 30.8 g/dL (ref 30.0–36.0)
MCV: 92 fL (ref 80.0–100.0)
Monocytes Absolute: 0.1 10*3/uL (ref 0.1–1.0)
Monocytes Relative: 3 %
Neutro Abs: 4.3 10*3/uL (ref 1.7–7.7)
Neutrophils Relative %: 91 %
Platelets: 201 10*3/uL (ref 150–400)
RBC: 3.63 MIL/uL — ABNORMAL LOW (ref 3.87–5.11)
RDW: 14.5 % (ref 11.5–15.5)
WBC: 4.7 10*3/uL (ref 4.0–10.5)
nRBC: 0 % (ref 0.0–0.2)

## 2019-04-10 LAB — RAPID URINE DRUG SCREEN, HOSP PERFORMED
Amphetamines: NOT DETECTED
Barbiturates: NOT DETECTED
Benzodiazepines: NOT DETECTED
Cocaine: NOT DETECTED
Opiates: NOT DETECTED
Tetrahydrocannabinol: NOT DETECTED

## 2019-04-10 LAB — ABO/RH: ABO/RH(D): O POS

## 2019-04-10 LAB — PHOSPHORUS: Phosphorus: 3.4 mg/dL (ref 2.5–4.6)

## 2019-04-10 LAB — D-DIMER, QUANTITATIVE: D-Dimer, Quant: 0.33 ug/mL-FEU (ref 0.00–0.50)

## 2019-04-10 LAB — FERRITIN: Ferritin: 99 ng/mL (ref 11–307)

## 2019-04-10 LAB — HIV ANTIBODY (ROUTINE TESTING W REFLEX): HIV Screen 4th Generation wRfx: NONREACTIVE

## 2019-04-10 LAB — MAGNESIUM: Magnesium: 2.2 mg/dL (ref 1.7–2.4)

## 2019-04-10 LAB — C-REACTIVE PROTEIN: CRP: 10.2 mg/dL — ABNORMAL HIGH (ref <1.0)

## 2019-04-10 MED ORDER — SODIUM CHLORIDE 0.9 % IV SOLN: 500.0000 mg | INTRAVENOUS | Status: DC

## 2019-04-10 MED ORDER — FLUTICASONE PROPIONATE HFA 110 MCG/ACT IN AERO
1.0000 | INHALATION_SPRAY | Freq: Every day | RESPIRATORY_TRACT | Status: DC
  Administered 2019-04-10 – 2019-04-14 (×5): 1 via RESPIRATORY_TRACT
  Filled 2019-04-10: qty 12

## 2019-04-10 MED ORDER — METHYLPREDNISOLONE SODIUM SUCC 40 MG IJ SOLR
40.0000 mg | Freq: Two times a day (BID) | INTRAMUSCULAR | Status: DC
  Administered 2019-04-10 – 2019-04-14 (×9): 40 mg via INTRAVENOUS
  Filled 2019-04-10 (×9): qty 1

## 2019-04-10 MED ORDER — FLUTICASONE-UMECLIDIN-VILANT 100-62.5-25 MCG/INH IN AEPB: 1.0000 | INHALATION_SPRAY | Freq: Every day | RESPIRATORY_TRACT | Status: DC

## 2019-04-10 MED ORDER — CLOPIDOGREL BISULFATE 75 MG PO TABS
75.0000 mg | ORAL_TABLET | Freq: Every day | ORAL | Status: DC
  Administered 2019-04-10 – 2019-04-14 (×5): 75 mg via ORAL
  Filled 2019-04-10 (×5): qty 1

## 2019-04-10 MED ORDER — FAMOTIDINE 20 MG PO TABS
20.0000 mg | ORAL_TABLET | Freq: Every day | ORAL | Status: DC
  Administered 2019-04-10 – 2019-04-14 (×5): 20 mg via ORAL
  Filled 2019-04-10 (×5): qty 1

## 2019-04-10 MED ORDER — GUAIFENESIN-DM 100-10 MG/5ML PO SYRP
10.0000 mL | ORAL_SOLUTION | ORAL | Status: DC | PRN
  Administered 2019-04-10: 10 mL via ORAL
  Filled 2019-04-10: qty 10

## 2019-04-10 MED ORDER — SODIUM CHLORIDE 0.9 % IV SOLN
INTRAVENOUS | Status: DC
  Administered 2019-04-10: 07:00:00 via INTRAVENOUS

## 2019-04-10 MED ORDER — ONDANSETRON HCL 4 MG/2ML IJ SOLN: 4.0000 mg | Freq: Four times a day (QID) | INTRAMUSCULAR | Status: DC | PRN

## 2019-04-10 MED ORDER — AMLODIPINE BESYLATE 5 MG PO TABS: 5.0000 mg | ORAL_TABLET | Freq: Every day | ORAL | Status: DC

## 2019-04-10 MED ORDER — UMECLIDINIUM-VILANTEROL 62.5-25 MCG/INH IN AEPB
1.0000 | INHALATION_SPRAY | Freq: Every day | RESPIRATORY_TRACT | Status: DC
  Administered 2019-04-10 – 2019-04-14 (×5): 1 via RESPIRATORY_TRACT
  Filled 2019-04-10: qty 14

## 2019-04-10 MED ORDER — NITROGLYCERIN 0.4 MG SL SUBL: 0.4000 mg | SUBLINGUAL_TABLET | SUBLINGUAL | Status: DC | PRN

## 2019-04-10 MED ORDER — SODIUM CHLORIDE 0.9 % IV SOLN: 2.0000 g | INTRAVENOUS | Status: DC

## 2019-04-10 MED ORDER — FUROSEMIDE 10 MG/ML IJ SOLN
40.0000 mg | Freq: Once | INTRAMUSCULAR | Status: AC
  Administered 2019-04-10: 40 mg via INTRAVENOUS
  Filled 2019-04-10: qty 4

## 2019-04-10 MED ORDER — ONDANSETRON HCL 4 MG PO TABS: 4.0000 mg | ORAL_TABLET | Freq: Four times a day (QID) | ORAL | Status: DC | PRN

## 2019-04-10 MED ORDER — HYDROCOD POLST-CPM POLST ER 10-8 MG/5ML PO SUER
5.0000 mL | Freq: Two times a day (BID) | ORAL | Status: DC | PRN
  Administered 2019-04-11 – 2019-04-13 (×3): 5 mL via ORAL
  Filled 2019-04-10 (×3): qty 5

## 2019-04-10 MED ORDER — ALBUTEROL SULFATE HFA 108 (90 BASE) MCG/ACT IN AERS
2.0000 | INHALATION_SPRAY | Freq: Four times a day (QID) | RESPIRATORY_TRACT | Status: DC
  Administered 2019-04-10 – 2019-04-14 (×14): 2 via RESPIRATORY_TRACT
  Filled 2019-04-10: qty 6.7

## 2019-04-10 NOTE — Progress Notes (Addendum)
PROGRESS NOTE                                                                                                                                                                                                             Patient Demographics:    [name], is a 58 y.o. female, DOB - Apr 06, 1961, [id number]  Outpatient Primary MD for the patient is Benito Mccreedy, MD   Admit date - 04/09/2019   LOS - 1  Chief Complaint  Patient presents with  . COVID  . Shortness of Breath       Brief Narrative: Patient is a 58 y.o. female with PMHx of COPD, chronic hypoxic respiratory failure on home O2 (3 L/min), CAD being managed medically, GERD, hep C, dyslipidemia-who presented with shortness of breath and cough-found to have severe hypoxic respiratory failure secondary to COVID-19.   Subjective:    [facility] today feels better-was on nonrebreather mask overnight-but now on just 3 L of oxygen.   Assessment  & Plan :   Acute Hypoxic Resp Failure due to Covid 19 Viral pneumonia: Improved-titrated down from NRB to 3 L of oxygen.  Continue steroids and remdesivir.  Given clinical improvement-do not think she requires Actemra or convalescent plasma.  Fever: afebrile  O2 requirements:  SpO2: 92 % O2 Flow Rate (L/min): 3 L/min   COVID-19 Labs: Recent Labs    04/09/19 1629 04/09/19 1656 04/10/19 0700  DDIMER  --  0.27 0.33  FERRITIN 80  --  99  LDH  --  213*  --   CRP 5.5*  --  10.2*    Lab Results  Component Value Date   SARSCOV2NAA Detected (A) 04/05/2019     COVID-19 Medications: Steroids: 11/24>> Remdesivir: 11/24>> Actemra: Not indicated given clinical improvement Convalescent Plasma: Indicated given clinical improvement  Other medications: Diuretics:Euvolemic-Lasix x1 to maintain negative balance Antibiotics:Not needed as no evidence of bacterial infection stop Rocephin/Zithromax on 11/25   Prone/Incentive Spirometry: encouraged  incentive spirometry use 3-4/hour.  DVT Prophylaxis  :  Lovenox   Hypokalemia: Repleted  COPD with chronic hypoxic respiratory failure on home O2: Stable-on 3 L of oxygen at home-continue bronchodilators  CAD: No anginal symptoms-managed medically  HTN: Blood pressure controlled without the use of antihypertensives-hold amlodipine for now.  GERD: Continue Pepcid  History of hepatitis C: Reviewed prior records-had  ID consultation in 2016-since patient had antibody positive without a viral load-looks like she had cleared the infection.  Obesity: Estimated body mass index is 30.81 kg/m as calculated from the following:   Height as of this encounter: 5\' 3"  (1.6 m).   Weight as of this encounter: 78.9 kg.   Consults  :  None  Procedures  :  None  ABG:    Component Value Date/Time   PHART 7.384 04/09/2019 1845   PCO2ART 49.1 (H) 04/09/2019 1845   PO2ART 114 (H) 04/09/2019 1845   HCO3 28.7 (H) 04/09/2019 1845   TCO2 22.3 02/07/2012 1111   ACIDBASEDEF 0.9 06/11/2009 1200   O2SAT 98.0 04/09/2019 1845    Vent Settings: N/A  Condition - Stable  Family Communication  : Spoke with patient's son over the phone  Code Status :  Full Code  Diet :  Diet Order            Diet Heart Room service appropriate? Yes; Fluid consistency: Thin  Diet effective now               Disposition Plan  :  Remain hospitalized  Barriers to discharge: Hypoxia requiring O2 supplementation/complete 5 days of IV Remdesivir  Antimicorbials  :    Anti-infectives (From admission, onward)   Start     Dose/Rate Route Frequency Ordered Stop   04/11/19 1000  remdesivir 100 mg in sodium chloride 0.9 % 250 mL IVPB     100 mg 500 mL/hr over 30 Minutes Intravenous Daily 04/09/19 2003 04/14/19 0959   04/10/19 2000  cefTRIAXone (ROCEPHIN) 2 g in sodium chloride 0.9 % 100 mL IVPB     2 g 200 mL/hr over 30 Minutes Intravenous Every 24 hours 04/10/19 0549      04/10/19 2000  azithromycin (ZITHROMAX) 500 mg in sodium chloride 0.9 % 250 mL IVPB     500 mg 250 mL/hr over 60 Minutes Intravenous Every 24 hours 04/10/19 0549     04/10/19 1600  remdesivir 100 mg in sodium chloride 0.9 % 250 mL IVPB     100 mg 500 mL/hr over 30 Minutes Intravenous  Once 04/09/19 2003     04/09/19 2015  remdesivir 200 mg in sodium chloride 0.9 % 250 mL IVPB     200 mg 500 mL/hr over 30 Minutes Intravenous Once 04/09/19 2003 04/09/19 2225   04/09/19 1815  cefTRIAXone (ROCEPHIN) 2 g in sodium chloride 0.9 % 100 mL IVPB  Status:  Discontinued     2 g 200 mL/hr over 30 Minutes Intravenous Every 24 hours 04/09/19 1805 04/10/19 0556   04/09/19 1815  azithromycin (ZITHROMAX) 500 mg in sodium chloride 0.9 % 250 mL IVPB  Status:  Discontinued     500 mg 250 mL/hr over 60 Minutes Intravenous Every 24 hours 04/09/19 1805 04/10/19 0556      Inpatient Medications  Scheduled Meds: . albuterol  2 puff Inhalation Q6H  . clopidogrel  75 mg Oral Daily  . enoxaparin (LOVENOX) injection  40 mg Subcutaneous Q24H  . famotidine  20 mg Oral Daily  . umeclidinium-vilanterol  1 puff Inhalation Daily   And  . fluticasone  1 puff Inhalation Daily  . methylPREDNISolone (SOLU-MEDROL) injection  40 mg Intravenous Q12H   Continuous Infusions: . sodium chloride    . sodium chloride Stopped (04/10/19 1000)  . azithromycin    . cefTRIAXone (ROCEPHIN)  IV    . remdesivir 100 mg in NS 250 mL  Followed by  . [START ON 04/11/2019] remdesivir 100 mg in NS 250 mL     PRN Meds:.sodium chloride, chlorpheniramine-HYDROcodone, guaiFENesin-dextromethorphan, nitroGLYCERIN, ondansetron **OR** ondansetron (ZOFRAN) IV   Time Spent in minutes  25  See all Orders from today for further details   Oren Binet M.D on 04/10/2019 at 3:24 PM  To page go to [email or web address] - use universal password  [facility] -  Office  [phone]    Objective:   Vitals:   04/10/19 0550 04/10/19 0753  04/10/19 1027 04/10/19 1130  BP: 115/72 106/69  139/86  Pulse:  73  85  Resp: 17 20  20   Temp: 98.9 F (37.2 C) 97.8 F (36.6 C)  98.9 F (37.2 C)  TempSrc: Oral Axillary  Oral  SpO2: 100% 100% 94% 92%  Weight: 78.9 kg     Height: 5\' 3"  (1.6 m)       Wt Readings from Last 3 Encounters:  04/10/19 78.9 kg  04/04/19 79.4 kg  03/04/19 79.4 kg     Intake/Output Summary (Last 24 hours) at 04/10/2019 1524 Last data filed at 04/10/2019 1422 Gross per 24 hour  Intake 2300 ml  Output 250 ml  Net 2050 ml     Physical Exam Gen Exam:Alert awake-not in any distress HEENT:atraumatic, normocephalic Chest: B/L clear to auscultation anteriorly CVS:S1S2 regular Abdomen:soft non tender, non distended Extremities:no edema Neurology: Non focal Skin: no rash   Data Review:    CBC Recent Labs  Lab 04/07/19 1251 04/09/19 1656 04/10/19 0700  WBC 6.6 6.1 4.7  HGB 11.9* 10.7* 10.3*  HCT 38.0 34.9* 33.4*  PLT 229 212 201  MCV 91.6 92.8 92.0  MCH 28.7 28.5 28.4  MCHC 31.3 30.7 30.8  RDW 14.1 14.2 14.5  LYMPHSABS 0.6* 0.5* 0.3*  MONOABS 0.4 0.2 0.1  EOSABS 0.0 0.0 0.0  BASOSABS 0.0 0.0 0.0    Chemistries  Recent Labs  Lab 04/07/19 1251 04/09/19 1656 04/10/19 0700  NA 138 138 140  K 3.5 2.3* 3.8  CL 100 105 103  CO2 28 25 28   GLUCOSE 91 129* 157*  BUN 9 5* 7  CREATININE 0.69 0.51 0.61  CALCIUM 8.6* 6.9* 7.9*  MG  --   --  2.2  AST 19 16 18   ALT 14 12 13   ALKPHOS 50 38 40  BILITOT 0.3 0.3 0.5   ------------------------------------------------------------------------------------------------------------------ Recent Labs    04/09/19 1656  TRIG 83    Lab Results  Component Value Date   HGBA1C (H) 06/16/2009    6.3 (NOTE) The ADA recommends the following therapeutic goal for glycemic control related to Hgb A1c measurement: Goal of therapy: <6.5 Hgb A1c  Reference: American Diabetes Association: Clinical Practice Recommendations 2010, Diabetes Care, 2010, 33:  (Suppl  1).   ------------------------------------------------------------------------------------------------------------------ No results for input(s): TSH, T4TOTAL, T3FREE, THYROIDAB in the last 72 hours.  Invalid input(s): FREET3 ------------------------------------------------------------------------------------------------------------------ Recent Labs    04/09/19 1629 04/10/19 0700  FERRITIN 80 99    Coagulation profile No results for input(s): INR, PROTIME in the last 168 hours.  Recent Labs    04/09/19 1656 04/10/19 0700  DDIMER 0.27 0.33    Cardiac Enzymes No results for input(s): CKMB, TROPONINI, MYOGLOBIN in the last 168 hours.  Invalid input(s): CK ------------------------------------------------------------------------------------------------------------------ No results found for: BNP  Micro Results Recent Results (from the past 240 hour(s))  Novel Coronavirus, NAA ([facility])     Status: Abnormal   Collection Time: 04/05/19 12:00 AM   Specimen: Nasopharyngeal(NP) swabs in  vial transport medium   NASOPHARYNGE  TESTING  Result Value Ref Range Status   SARS-CoV-2, NAA Detected (A) Not Detected Final    Comment: This nucleic acid amplification test was developed and its performance characteristics determined by [employer]. Nucleic acid amplification tests include PCR and TMA. This test has not been FDA cleared or approved. This test has been authorized by FDA under an Emergency Use Authorization (EUA). This test is only authorized for the duration of time the declaration that circumstances exist justifying the authorization of the emergency use of in vitro diagnostic tests for detection of SARS-CoV-2 virus and/or diagnosis of COVID-19 infection under section 564(b)(1) of the Act, 21 U.S.C. PT:2852782) (1), unless the authorization is terminated or revoked sooner. When diagnostic testing is negative, the possibility of a false negative result  should be considered in the context of a patient's recent exposures and the presence of clinical signs and symptoms consistent with COVID-19. An individual without symptoms of COVID-19 and who is not shedding SARS-CoV-2 virus would  expect to have a negative (not detected) result in this assay.   Blood Culture (routine x 2)     Status: None (Preliminary result)   Collection Time: 04/09/19  4:56 PM   Specimen: BLOOD  Result Value Ref Range Status   Specimen Description   Final    BLOOD LEFT ANTECUBITAL Performed at [facility] [address] [place], [place] [address]    Special Requests   Final    BOTTLES DRAWN AEROBIC AND ANAEROBIC Blood Culture results may not be optimal due to an excessive volume of blood received in culture bottles Performed at [facility] [address], [place], [place] [address]    Culture   Final    NO GROWTH < 24 HOURS Performed at [facility] [address] [place], [place] [address]    Report Status PENDING  Incomplete  Blood Culture (routine x 2)     Status: None (Preliminary result)   Collection Time: 04/09/19  5:11 PM   Specimen: Right Antecubital; Blood  Result Value Ref Range Status   Specimen Description   Final    RIGHT ANTECUBITAL Performed at [facility] [address] [place], [place] [address]    Special Requests   Final    BOTTLES DRAWN AEROBIC AND ANAEROBIC Blood Culture adequate volume Performed at [facility] [address], [place], [place] [address]    Culture   Final    NO GROWTH < 24 HOURS Performed at [facility] [address] [place],  [address]    Report Status PENDING  Incomplete    Radiology Reports Dg Chest 2 View  Result Date: 04/04/2019 CLINICAL DATA:  Productive cough, shortness of breath. EXAM: CHEST - 2 VIEW COMPARISON:  June 06, 2017. FINDINGS: The heart size and mediastinal contours are within  normal limits. Both lungs are clear. No pneumothorax or pleural effusion is noted. The visualized skeletal structures are unremarkable. IMPRESSION: No active cardiopulmonary disease. Electronically Signed   By: Marijo Conception M.D.   On: 04/04/2019 16:21   Dg Chest Port 1 View  Result Date: 04/09/2019 CLINICAL DATA:  COVID-19 positive. Shortness of breath. EXAM: PORTABLE CHEST 1 VIEW COMPARISON:  April 07, 2019 FINDINGS: There is patchy airspace opacity in the lung bases with associated mild bibasilar atelectasis. Lungs elsewhere clear. Heart size and pulmonary vascularity are normal. No adenopathy. No bone lesions. IMPRESSION: Bibasilar patchy  airspace opacity with associated mild bibasilar atelectasis. Suspect bibasilar pneumonia, likely of atypical organism etiology. Heart size normal.  No adenopathy demonstrable. Electronically Signed   By: Lowella Grip III M.D.   On: 04/09/2019 17:55   Dg Chest Portable 1 View  Result Date: 04/07/2019 CLINICAL DATA:  Shortness of breath EXAM: PORTABLE CHEST 1 VIEW COMPARISON:  Chest radiograph dated 04/04/2019 FINDINGS: The heart size and mediastinal contours are within normal limits. There is minimal left basilar atelectasis. The right lung is clear. There is no pleural effusion or pneumothorax. The visualized skeletal structures are unremarkable. IMPRESSION: Minimal left basilar atelectasis. Electronically Signed   By: Zerita Boers M.D.   On: 04/07/2019 13:11

## 2019-04-11 LAB — CBC WITH DIFFERENTIAL/PLATELET
Abs Immature Granulocytes: 0.01 10*3/uL (ref 0.00–0.07)
Basophils Absolute: 0 10*3/uL (ref 0.0–0.1)
Basophils Relative: 0 %
Eosinophils Absolute: 0 10*3/uL (ref 0.0–0.5)
Eosinophils Relative: 0 %
HCT: 36.2 % (ref 36.0–46.0)
Hemoglobin: 11.7 g/dL — ABNORMAL LOW (ref 12.0–15.0)
Immature Granulocytes: 0 %
Lymphocytes Relative: 8 %
Lymphs Abs: 0.4 10*3/uL — ABNORMAL LOW (ref 0.7–4.0)
MCH: 28.9 pg (ref 26.0–34.0)
MCHC: 32.3 g/dL (ref 30.0–36.0)
MCV: 89.4 fL (ref 80.0–100.0)
Monocytes Absolute: 0.2 10*3/uL (ref 0.1–1.0)
Monocytes Relative: 4 %
Neutro Abs: 4.5 10*3/uL (ref 1.7–7.7)
Neutrophils Relative %: 88 %
Platelets: 243 10*3/uL (ref 150–400)
RBC: 4.05 MIL/uL (ref 3.87–5.11)
RDW: 13.9 % (ref 11.5–15.5)
WBC: 5.1 10*3/uL (ref 4.0–10.5)
nRBC: 0 % (ref 0.0–0.2)

## 2019-04-11 LAB — COMPREHENSIVE METABOLIC PANEL
ALT: 14 U/L (ref 0–44)
AST: 16 U/L (ref 15–41)
Albumin: 3.3 g/dL — ABNORMAL LOW (ref 3.5–5.0)
Alkaline Phosphatase: 46 U/L (ref 38–126)
Anion gap: 11 (ref 5–15)
BUN: 13 mg/dL (ref 6–20)
CO2: 28 mmol/L (ref 22–32)
Calcium: 8.5 mg/dL — ABNORMAL LOW (ref 8.9–10.3)
Chloride: 97 mmol/L — ABNORMAL LOW (ref 98–111)
Creatinine, Ser: 0.65 mg/dL (ref 0.44–1.00)
GFR calc Af Amer: >60 mL/min (ref >60)
GFR calc non Af Amer: >60 mL/min (ref >60)
Glucose, Bld: 131 mg/dL — ABNORMAL HIGH (ref 70–99)
Potassium: 3.7 mmol/L (ref 3.5–5.1)
Sodium: 136 mmol/L (ref 135–145)
Total Bilirubin: 0.6 mg/dL (ref 0.3–1.2)
Total Protein: 6.8 g/dL (ref 6.5–8.1)

## 2019-04-11 LAB — D-DIMER, QUANTITATIVE: D-Dimer, Quant: 0.4 ug/mL-FEU (ref 0.00–0.50)

## 2019-04-11 LAB — C-REACTIVE PROTEIN: CRP: 6.1 mg/dL — ABNORMAL HIGH (ref <1.0)

## 2019-04-11 LAB — MAGNESIUM: Magnesium: 2.1 mg/dL (ref 1.7–2.4)

## 2019-04-11 LAB — FERRITIN: Ferritin: 110 ng/mL (ref 11–307)

## 2019-04-11 MED ORDER — FUROSEMIDE 10 MG/ML IJ SOLN
40.0000 mg | Freq: Once | INTRAMUSCULAR | Status: AC
  Administered 2019-04-11: 40 mg via INTRAVENOUS
  Filled 2019-04-11: qty 4

## 2019-04-11 NOTE — Plan of Care (Signed)
Pt slept well during the night. No complaints of pain verbalized. Alert and oriented. Vitals stable on her baseline 3L [employer]. Minimal dyspnea on exertion and productive cough noted, prn Robitussin given. Standby assist with ADLs and transfers to the [facility] for toileting needs. IV SL. Self turns in bed   for pressure relief but unable to tolerate prone positioning overnight. No other issues, will monitor.   Problem: Respiratory: Goal: Will maintain a patent airway Outcome: Progressing Goal: Complications related to the disease process, condition or treatment will be avoided or minimized Outcome: Progressing   Problem: Coping: Goal: Psychosocial and spiritual needs will be supported Outcome: Progressing   Problem: Education: Goal: Knowledge of risk factors and measures for prevention of condition will improve Outcome: Progressing

## 2019-04-11 NOTE — Progress Notes (Signed)
PROGRESS NOTE                                                                                                                                                                                                             Patient Demographics:    [name], is a 58 y.o. female, DOB - 12-08-1960, [id number]  Outpatient Primary MD for the patient is Benito Mccreedy, MD   Admit date - 04/09/2019   LOS - 2  Chief Complaint  Patient presents with  . COVID  . Shortness of Breath       Brief Narrative: Patient is a 58 y.o. female with PMHx of COPD, chronic hypoxic respiratory failure on home O2 (3 L/min), CAD being managed medically, GERD, hep C, dyslipidemia-who presented with shortness of breath and cough-found to have severe hypoxic respiratory failure secondary to COVID-19.   Subjective:    [facility] feels better-she remains stable on 3 L of oxygen.  No chest pain-no shortness of breath at rest.   Assessment  & Plan :   Acute Hypoxic Resp Failure due to Covid 19 Viral pneumonia: Continues to improve-remains stable on 3 L of oxygen (her home regimen).  Plans are to continue with steroids and remdesivir.  Do not think she needs Actemra or convalescent plasma given clinical improvement.    Fever: afebrile  O2 requirements:  SpO2: 90 % O2 Flow Rate (L/min): 3 L/min   COVID-19 Labs: Recent Labs    04/09/19 1629 04/09/19 1656 04/10/19 0700 04/11/19 0400  DDIMER  --  0.27 0.33 0.40  FERRITIN 80  --  99 110  LDH  --  213*  --   --   CRP 5.5*  --  10.2* 6.1*    Lab Results  Component Value Date   SARSCOV2NAA Detected (A) 04/05/2019     COVID-19 Medications: Steroids: 11/24>> Remdesivir: 11/24>> Actemra: Not indicated given clinical improvement Convalescent Plasma: Indicated given clinical improvement  Other medications: Diuretics:Euvolemic-repeat IV Lasix x1 to maintain negative balance  Antibiotics:Not needed as no evidence of bacterial infection stop Rocephin/Zithromax on 11/25  Prone/Incentive Spirometry: encouraged  incentive spirometry use 3-4/hour.  DVT Prophylaxis  :  Lovenox   Hypokalemia: Repleted  COPD with chronic hypoxic respiratory failure on home O2: Stable-on 3 L of oxygen at home-continue bronchodilators  CAD: No anginal symptoms-managed medically  HTN: Blood pressure controlled without the use of antihypertensives-hold amlodipine for now.  GERD: Continue Pepcid  History of hepatitis C: Reviewed prior records-had ID consultation in 2016-since patient had antibody positive without a viral load-looks like she had cleared the infection.  Obesity: Estimated body mass index is 30.81 kg/m as calculated from the following:   Height as of this encounter: 5\' 3"  (1.6 m).   Weight as of this encounter: 78.9 kg.   Consults  :  None  Procedures  :  None  ABG:    Component Value Date/Time   PHART 7.384 04/09/2019 1845   PCO2ART 49.1 (H) 04/09/2019 1845   PO2ART 114 (H) 04/09/2019 1845   HCO3 28.7 (H) 04/09/2019 1845   TCO2 22.3 02/07/2012 1111   ACIDBASEDEF 0.9 06/11/2009 1200   O2SAT 98.0 04/09/2019 1845    Vent Settings: N/A  Condition - Stable  Family Communication  : Spoke with patient's son over the phone on 11/26  Code Status :  Full Code  Diet :  Diet Order            Diet Heart Room service appropriate? Yes; Fluid consistency: Thin  Diet effective now               Disposition Plan  :  Remain hospitalized  Barriers to discharge: Hypoxia requiring O2 supplementation/complete 5 days of IV Remdesivir  Antimicorbials  :    Anti-infectives (From admission, onward)   Start     Dose/Rate Route Frequency Ordered Stop   04/11/19 1000  remdesivir 100 mg in sodium chloride 0.9 % 250 mL IVPB     100 mg 500 mL/hr over 30 Minutes Intravenous Daily 04/09/19 2003 04/14/19 0959   04/10/19 2000  cefTRIAXone (ROCEPHIN) 2 g in sodium  chloride 0.9 % 100 mL IVPB  Status:  Discontinued     2 g 200 mL/hr over 30 Minutes Intravenous Every 24 hours 04/10/19 0549 04/10/19 1527   04/10/19 2000  azithromycin (ZITHROMAX) 500 mg in sodium chloride 0.9 % 250 mL IVPB  Status:  Discontinued     500 mg 250 mL/hr over 60 Minutes Intravenous Every 24 hours 04/10/19 0549 04/10/19 1527   04/10/19 1600  remdesivir 100 mg in sodium chloride 0.9 % 250 mL IVPB     100 mg 500 mL/hr over 30 Minutes Intravenous  Once 04/09/19 2003 04/10/19 1729   04/09/19 2015  remdesivir 200 mg in sodium chloride 0.9 % 250 mL IVPB     200 mg 500 mL/hr over 30 Minutes Intravenous Once 04/09/19 2003 04/09/19 2225   04/09/19 1815  cefTRIAXone (ROCEPHIN) 2 g in sodium chloride 0.9 % 100 mL IVPB  Status:  Discontinued     2 g 200 mL/hr over 30 Minutes Intravenous Every 24 hours 04/09/19 1805 04/10/19 0556   04/09/19 1815  azithromycin (ZITHROMAX) 500 mg in sodium chloride 0.9 % 250 mL IVPB  Status:  Discontinued     500 mg 250 mL/hr over 60 Minutes Intravenous Every 24 hours 04/09/19 1805 04/10/19 0556      Inpatient Medications  Scheduled Meds: . albuterol  2 puff Inhalation Q6H  . clopidogrel  75 mg Oral Daily  . enoxaparin (LOVENOX) injection  40 mg Subcutaneous Q24H  . famotidine  20 mg Oral Daily  . umeclidinium-vilanterol  1 puff Inhalation Daily   And  . fluticasone  1 puff Inhalation Daily  . methylPREDNISolone (SOLU-MEDROL) injection  40 mg Intravenous Q12H   Continuous Infusions: . sodium chloride    .  sodium chloride Stopped (04/10/19 1000)  . remdesivir 100 mg in NS 250 mL 100 mg (04/11/19 1037)   PRN Meds:.sodium chloride, chlorpheniramine-HYDROcodone, guaiFENesin-dextromethorphan, nitroGLYCERIN, ondansetron **OR** ondansetron (ZOFRAN) IV   Time Spent in minutes  25  See all Orders from today for further details   Oren Binet M.D on 04/11/2019 at 2:13 PM  To page go to [email or web address] - use universal password  [facility]  -  Office  [phone]    Objective:   Vitals:   04/10/19 2300 04/11/19 0344 04/11/19 0737 04/11/19 1236  BP: 117/72 126/76 125/82 125/80  Pulse: 86 (!) 56 74 80  Resp:  19 20 20   Temp: 99 F (37.2 C) 98.9 F (37.2 C) 98.4 F (36.9 C)   TempSrc: Oral Oral Oral   SpO2: 93% 94% 93% 90%  Weight:      Height:        Wt Readings from Last 3 Encounters:  04/10/19 78.9 kg  04/04/19 79.4 kg  03/04/19 79.4 kg     Intake/Output Summary (Last 24 hours) at 04/11/2019 1413 Last data filed at 04/11/2019 0300 Gross per 24 hour  Intake 1840 ml  Output 900 ml  Net 940 ml     Physical Exam Gen Exam:Alert awake-not in any distress HEENT:atraumatic, normocephalic Chest: B/L clear to auscultation anteriorly CVS:S1S2 regular Abdomen:soft non tender, non distended Extremities:no edema Neurology: Non focal Skin: no rash   Data Review:    CBC Recent Labs  Lab 04/07/19 1251 04/09/19 1656 04/10/19 0700 04/11/19 0400  WBC 6.6 6.1 4.7 5.1  HGB 11.9* 10.7* 10.3* 11.7*  HCT 38.0 34.9* 33.4* 36.2  PLT 229 212 201 243  MCV 91.6 92.8 92.0 89.4  MCH 28.7 28.5 28.4 28.9  MCHC 31.3 30.7 30.8 32.3  RDW 14.1 14.2 14.5 13.9  LYMPHSABS 0.6* 0.5* 0.3* 0.4*  MONOABS 0.4 0.2 0.1 0.2  EOSABS 0.0 0.0 0.0 0.0  BASOSABS 0.0 0.0 0.0 0.0    Chemistries  Recent Labs  Lab 04/07/19 1251 04/09/19 1656 04/10/19 0700 04/11/19 0400  NA 138 138 140 136  K 3.5 2.3* 3.8 3.7  CL 100 105 103 97*  CO2 28 25 28 28   GLUCOSE 91 129* 157* 131*  BUN 9 5* 7 13  CREATININE 0.69 0.51 0.61 0.65  CALCIUM 8.6* 6.9* 7.9* 8.5*  MG  --   --  2.2 2.1  AST 19 16 18 16   ALT 14 12 13 14   ALKPHOS 50 38 40 46  BILITOT 0.3 0.3 0.5 0.6   ------------------------------------------------------------------------------------------------------------------ Recent Labs    04/09/19 1656  TRIG 83    Lab Results  Component Value Date   HGBA1C (H) 06/16/2009    6.3 (NOTE) The ADA recommends the following  therapeutic goal for glycemic control related to Hgb A1c measurement: Goal of therapy: <6.5 Hgb A1c  Reference: American Diabetes Association: Clinical Practice Recommendations 2010, Diabetes Care, 2010, 33: (Suppl  1).   ------------------------------------------------------------------------------------------------------------------ No results for input(s): TSH, T4TOTAL, T3FREE, THYROIDAB in the last 72 hours.  Invalid input(s): FREET3 ------------------------------------------------------------------------------------------------------------------ Recent Labs    04/10/19 0700 04/11/19 0400  FERRITIN 99 110    Coagulation profile No results for input(s): INR, PROTIME in the last 168 hours.  Recent Labs    04/10/19 0700 04/11/19 0400  DDIMER 0.33 0.40    Cardiac Enzymes No results for input(s): CKMB, TROPONINI, MYOGLOBIN in the last 168 hours.  Invalid input(s): CK ------------------------------------------------------------------------------------------------------------------ No results found for: BNP  Micro Results Recent Results (from  the past 240 hour(s))  Novel Coronavirus, NAA ([facility])     Status: Abnormal   Collection Time: 04/05/19 12:00 AM   Specimen: Nasopharyngeal(NP) swabs in vial transport medium   NASOPHARYNGE  TESTING  Result Value Ref Range Status   SARS-CoV-2, NAA Detected (A) Not Detected Final    Comment: This nucleic acid amplification test was developed and its performance characteristics determined by [employer]. Nucleic acid amplification tests include PCR and TMA. This test has not been FDA cleared or approved. This test has been authorized by FDA under an Emergency Use Authorization (EUA). This test is only authorized for the duration of time the declaration that circumstances exist justifying the authorization of the emergency use of in vitro diagnostic tests for detection of SARS-CoV-2 virus and/or diagnosis of COVID-19 infection  under section 564(b)(1) of the Act, 21 U.S.C. [id number]) (1), unless the authorization is terminated or revoked sooner. When diagnostic testing is negative, the possibility of a false negative result should be considered in the context of a patient's recent exposures and the presence of clinical signs and symptoms consistent with COVID-19. An individual without symptoms of COVID-19 and who is not shedding SARS-CoV-2 virus would  expect to have a negative (not detected) result in this assay.   Blood Culture (routine x 2)     Status: None (Preliminary result)   Collection Time: 04/09/19  4:56 PM   Specimen: BLOOD  Result Value Ref Range Status   Specimen Description   Final    BLOOD LEFT ANTECUBITAL Performed at [facility] [address] [place], [place] [address]    Special Requests   Final    BOTTLES DRAWN AEROBIC AND ANAEROBIC Blood Culture results may not be optimal due to an excessive volume of blood received in culture bottles Performed at [facility] [address] [place], [place] [address]    Culture   Final    NO GROWTH 2 DAYS Performed at [facility] [address] [place], [place] [address]    Report Status PENDING  Incomplete  Blood Culture (routine x 2)     Status: None (Preliminary result)   Collection Time: 04/09/19  5:11 PM   Specimen: Right Antecubital; Blood  Result Value Ref Range Status   Specimen Description   Final    RIGHT ANTECUBITAL Performed at [facility] [address] [place], [place] [address]    Special Requests   Final    BOTTLES DRAWN AEROBIC AND ANAEROBIC Blood Culture adequate volume Performed at [facility] [address], [place], [place] [address]    Culture   Final    NO GROWTH 2 DAYS Performed at [facility] [address] [place],  [address]    Report Status PENDING  Incomplete    Radiology Reports Dg Chest 2 View   Result Date: 04/04/2019 CLINICAL DATA:  Productive cough, shortness of breath. EXAM: CHEST - 2 VIEW COMPARISON:  June 06, 2017. FINDINGS: The heart size and mediastinal contours are within normal limits. Both lungs are clear. No pneumothorax or pleural effusion is noted. The visualized skeletal structures are unremarkable. IMPRESSION: No active cardiopulmonary disease. Electronically Signed   By: Marijo Conception M.D.   On: 04/04/2019 16:21   Dg Chest Port 1 View  Result Date: 04/09/2019 CLINICAL DATA:  COVID-19 positive. Shortness of breath. EXAM: PORTABLE CHEST 1 VIEW COMPARISON:  April 07, 2019 FINDINGS: There is patchy airspace opacity in  the lung bases with associated mild bibasilar atelectasis. Lungs elsewhere clear. Heart size and pulmonary vascularity are normal. No adenopathy. No bone lesions. IMPRESSION: Bibasilar patchy airspace opacity with associated mild bibasilar atelectasis. Suspect bibasilar pneumonia, likely of atypical organism etiology. Heart size normal.  No adenopathy demonstrable. Electronically Signed   By: Lowella Grip III M.D.   On: 04/09/2019 17:55   Dg Chest Portable 1 View  Result Date: 04/07/2019 CLINICAL DATA:  Shortness of breath EXAM: PORTABLE CHEST 1 VIEW COMPARISON:  Chest radiograph dated 04/04/2019 FINDINGS: The heart size and mediastinal contours are within normal limits. There is minimal left basilar atelectasis. The right lung is clear. There is no pleural effusion or pneumothorax. The visualized skeletal structures are unremarkable. IMPRESSION: Minimal left basilar atelectasis. Electronically Signed   By: Zerita Boers M.D.   On: 04/07/2019 13:11

## 2019-04-12 LAB — CBC WITH DIFFERENTIAL/PLATELET
Abs Immature Granulocytes: 0.02 10*3/uL (ref 0.00–0.07)
Basophils Absolute: 0 10*3/uL (ref 0.0–0.1)
Basophils Relative: 0 %
Eosinophils Absolute: 0 10*3/uL (ref 0.0–0.5)
Eosinophils Relative: 0 %
HCT: 38.9 % (ref 36.0–46.0)
Hemoglobin: 12.5 g/dL (ref 12.0–15.0)
Immature Granulocytes: 0 %
Lymphocytes Relative: 10 %
Lymphs Abs: 0.5 10*3/uL — ABNORMAL LOW (ref 0.7–4.0)
MCH: 28.7 pg (ref 26.0–34.0)
MCHC: 32.1 g/dL (ref 30.0–36.0)
MCV: 89.2 fL (ref 80.0–100.0)
Monocytes Absolute: 0.2 10*3/uL (ref 0.1–1.0)
Monocytes Relative: 3 %
Neutro Abs: 4.5 10*3/uL (ref 1.7–7.7)
Neutrophils Relative %: 87 %
Platelets: 273 10*3/uL (ref 150–400)
RBC: 4.36 MIL/uL (ref 3.87–5.11)
RDW: 13.9 % (ref 11.5–15.5)
WBC: 5.1 10*3/uL (ref 4.0–10.5)
nRBC: 0 % (ref 0.0–0.2)

## 2019-04-12 LAB — COMPREHENSIVE METABOLIC PANEL
ALT: 14 U/L (ref 0–44)
AST: 15 U/L (ref 15–41)
Albumin: 3.3 g/dL — ABNORMAL LOW (ref 3.5–5.0)
Alkaline Phosphatase: 48 U/L (ref 38–126)
Anion gap: 14 (ref 5–15)
BUN: 16 mg/dL (ref 6–20)
CO2: 30 mmol/L (ref 22–32)
Calcium: 8.8 mg/dL — ABNORMAL LOW (ref 8.9–10.3)
Chloride: 96 mmol/L — ABNORMAL LOW (ref 98–111)
Creatinine, Ser: 0.62 mg/dL (ref 0.44–1.00)
GFR calc Af Amer: >60 mL/min (ref >60)
GFR calc non Af Amer: >60 mL/min (ref >60)
Glucose, Bld: 133 mg/dL — ABNORMAL HIGH (ref 70–99)
Potassium: 3.6 mmol/L (ref 3.5–5.1)
Sodium: 140 mmol/L (ref 135–145)
Total Bilirubin: 0.4 mg/dL (ref 0.3–1.2)
Total Protein: 6.8 g/dL (ref 6.5–8.1)

## 2019-04-12 LAB — FERRITIN: Ferritin: 139 ng/mL (ref 11–307)

## 2019-04-12 LAB — D-DIMER, QUANTITATIVE: D-Dimer, Quant: 0.29 ug/mL-FEU (ref 0.00–0.50)

## 2019-04-12 LAB — C-REACTIVE PROTEIN: CRP: 2.3 mg/dL — ABNORMAL HIGH (ref <1.0)

## 2019-04-12 LAB — MAGNESIUM: Magnesium: 2.2 mg/dL (ref 1.7–2.4)

## 2019-04-12 MED ORDER — FUROSEMIDE 10 MG/ML IJ SOLN
20.0000 mg | Freq: Once | INTRAMUSCULAR | Status: AC
  Administered 2019-04-12: 20 mg via INTRAVENOUS
  Filled 2019-04-12: qty 2

## 2019-04-12 NOTE — Progress Notes (Signed)
SATURATION QUALIFICATIONS: (This note is used to comply with regulatory documentation for home oxygen)  Patient Saturations on Room Air at Rest = 90%  Patient Saturations on Room Air while Ambulating = 87%  Patient Saturations on 2 Liters of oxygen while Ambulating = 92%  Please briefly explain why patient needs home oxygen: Pt desaturates on RA during mobility.    Verdene Lennert, PT, DPT  Acute Rehabilitation [phone] pager [phone] office  @ [facility]: [phone]

## 2019-04-12 NOTE — Plan of Care (Signed)
Alert and oriented 4x, diuresing well following lasix 20 mg IV. Remain on O2 at 3 liters [place] with sat =/> 92%. Diminished breath sounds, IS encouraged. Up to bedside commode independently. Denies pain.

## 2019-04-12 NOTE — Progress Notes (Addendum)
PROGRESS NOTE                                                                                                                                                                                                             Patient Demographics:    [name], is a 58 y.o. female, DOB - 02/04/1961, [id number]  Outpatient Primary MD for the patient is Benito Mccreedy, MD   Admit date - 04/09/2019   LOS - 3  Chief Complaint  Patient presents with  . COVID  . Shortness of Breath       Brief Narrative: Patient is a 58 y.o. female with PMHx of COPD, chronic hypoxic respiratory failure on home O2 (3 L/min), CAD being managed medically, GERD, hep C, dyslipidemia-who presented with shortness of breath and cough-found to have severe hypoxic respiratory failure secondary to COVID-19.   Subjective:    [name] remains stable-she is on around 2.5 L of oxygen this morning.  She continues to feel better..   Assessment  & Plan :   Acute on chronic hypoxic Resp Failure due to Covid 19 Viral pneumonia: Improved-remains stable around 2.5 L-continue steroids and remdesivir.  Fever: afebrile  O2 requirements:  SpO2: 95 % O2 Flow Rate (L/min): 2 L/min   COVID-19 Labs: Recent Labs    04/09/19 1656 04/10/19 0700 04/11/19 0400 04/12/19 0325  DDIMER 0.27 0.33 0.40 0.29  FERRITIN  --  99 110 139  LDH 213*  --   --   --   CRP  --  10.2* 6.1* 2.3*    Lab Results  Component Value Date   SARSCOV2NAA Detected (A) 04/05/2019     COVID-19 Medications: Steroids: 11/24>> Remdesivir: 11/24>> Actemra: Not indicated given clinical improvement Convalescent Plasma: Indicated given clinical improvement  Other medications: Diuretics:Euvolemic-repeat IV Lasix x1 today.  Keep in negative balance Antibiotics:Not needed as no evidence of bacterial infection stop Rocephin/Zithromax on 11/25  Prone/Incentive Spirometry:  encouraged  incentive spirometry use 3-4/hour.  DVT Prophylaxis  :  Lovenox   Hypokalemia: Repleted  COPD with chronic hypoxic respiratory failure on home O2: Stable-on 3 L of oxygen at home-continue bronchodilators  CAD: No anginal symptoms-managed medically  HTN: Blood pressure controlled without the use of antihypertensives-hold amlodipine for now.  GERD: Continue Pepcid  History of hepatitis C: Reviewed prior records-had ID consultation in  2016-since patient had antibody positive without a viral load-looks like she had cleared the infection.  Obesity: Estimated body mass index is 30.81 kg/m as calculated from the following:   Height as of this encounter: 5\' 3"  (1.6 m).   Weight as of this encounter: 78.9 kg.   Consults  :  None  Procedures  :  None  ABG:    Component Value Date/Time   PHART 7.384 04/09/2019 1845   PCO2ART 49.1 (H) 04/09/2019 1845   PO2ART 114 (H) 04/09/2019 1845   HCO3 28.7 (H) 04/09/2019 1845   TCO2 22.3 02/07/2012 1111   ACIDBASEDEF 0.9 06/11/2009 1200   O2SAT 98.0 04/09/2019 1845    Vent Settings: N/A  Condition - Stable  Family Communication  : Spoke with patient's son over the phone on 11/27  Code Status :  Full Code  Diet :  Diet Order            Diet Heart Room service appropriate? Yes; Fluid consistency: Thin  Diet effective now               Disposition Plan  :  Remain hospitalized-Home likely on 11/28  Barriers to discharge: Hypoxia requiring O2 supplementation/complete 5 days of IV Remdesivir  Antimicorbials  :    Anti-infectives (From admission, onward)   Start     Dose/Rate Route Frequency Ordered Stop   04/11/19 1000  remdesivir 100 mg in sodium chloride 0.9 % 250 mL IVPB     100 mg 500 mL/hr over 30 Minutes Intravenous Daily 04/09/19 2003 04/14/19 0959   04/10/19 2000  cefTRIAXone (ROCEPHIN) 2 g in sodium chloride 0.9 % 100 mL IVPB  Status:  Discontinued     2 g 200 mL/hr over 30 Minutes Intravenous Every 24  hours 04/10/19 0549 04/10/19 1527   04/10/19 2000  azithromycin (ZITHROMAX) 500 mg in sodium chloride 0.9 % 250 mL IVPB  Status:  Discontinued     500 mg 250 mL/hr over 60 Minutes Intravenous Every 24 hours 04/10/19 0549 04/10/19 1527   04/10/19 1600  remdesivir 100 mg in sodium chloride 0.9 % 250 mL IVPB     100 mg 500 mL/hr over 30 Minutes Intravenous  Once 04/09/19 2003 04/10/19 1729   04/09/19 2015  remdesivir 200 mg in sodium chloride 0.9 % 250 mL IVPB     200 mg 500 mL/hr over 30 Minutes Intravenous Once 04/09/19 2003 04/09/19 2225   04/09/19 1815  cefTRIAXone (ROCEPHIN) 2 g in sodium chloride 0.9 % 100 mL IVPB  Status:  Discontinued     2 g 200 mL/hr over 30 Minutes Intravenous Every 24 hours 04/09/19 1805 04/10/19 0556   04/09/19 1815  azithromycin (ZITHROMAX) 500 mg in sodium chloride 0.9 % 250 mL IVPB  Status:  Discontinued     500 mg 250 mL/hr over 60 Minutes Intravenous Every 24 hours 04/09/19 1805 04/10/19 0556      Inpatient Medications  Scheduled Meds: . albuterol  2 puff Inhalation Q6H  . clopidogrel  75 mg Oral Daily  . enoxaparin (LOVENOX) injection  40 mg Subcutaneous Q24H  . famotidine  20 mg Oral Daily  . umeclidinium-vilanterol  1 puff Inhalation Daily   And  . fluticasone  1 puff Inhalation Daily  . methylPREDNISolone (SOLU-MEDROL) injection  40 mg Intravenous Q12H   Continuous Infusions: . sodium chloride    . sodium chloride Stopped (04/10/19 1000)  . remdesivir 100 mg in NS 250 mL 100 mg (04/12/19 0920)  PRN Meds:.sodium chloride, chlorpheniramine-HYDROcodone, guaiFENesin-dextromethorphan, nitroGLYCERIN, ondansetron **OR** ondansetron (ZOFRAN) IV   Time Spent in minutes  25  See all Orders from today for further details   Oren Binet M.D on 04/12/2019 at 1:35 PM  To page go to [email or web address] - use universal password  [facility] -  Office  [phone]    Objective:   Vitals:   04/12/19 0517 04/12/19 0803 04/12/19 1100 04/12/19  1158  BP: 120/72 122/82 113/77   Pulse: 81 65 62   Resp: 18 15 16 16   Temp:  98 F (36.7 C) 98.3 F (36.8 C)   TempSrc:  Oral Oral   SpO2: 92% 94% 95%   Weight:      Height:        Wt Readings from Last 3 Encounters:  04/10/19 78.9 kg  04/04/19 79.4 kg  03/04/19 79.4 kg     Intake/Output Summary (Last 24 hours) at 04/12/2019 1335 Last data filed at 04/12/2019 1200 Gross per 24 hour  Intake 970 ml  Output 1000 ml  Net -30 ml     Physical Exam Gen Exam:Alert awake-not in any distress HEENT:atraumatic, normocephalic Chest: B/L clear to auscultation anteriorly CVS:S1S2 regular Abdomen:soft non tender, non distended Extremities:no edema Neurology: Non focal Skin: no rash   Data Review:    CBC Recent Labs  Lab 04/07/19 1251 04/09/19 1656 04/10/19 0700 04/11/19 0400 04/12/19 0325  WBC 6.6 6.1 4.7 5.1 5.1  HGB 11.9* 10.7* 10.3* 11.7* 12.5  HCT 38.0 34.9* 33.4* 36.2 38.9  PLT 229 212 201 243 273  MCV 91.6 92.8 92.0 89.4 89.2  MCH 28.7 28.5 28.4 28.9 28.7  MCHC 31.3 30.7 30.8 32.3 32.1  RDW 14.1 14.2 14.5 13.9 13.9  LYMPHSABS 0.6* 0.5* 0.3* 0.4* 0.5*  MONOABS 0.4 0.2 0.1 0.2 0.2  EOSABS 0.0 0.0 0.0 0.0 0.0  BASOSABS 0.0 0.0 0.0 0.0 0.0    Chemistries  Recent Labs  Lab 04/07/19 1251 04/09/19 1656 04/10/19 0700 04/11/19 0400 04/12/19 0325  NA 138 138 140 136 140  K 3.5 2.3* 3.8 3.7 3.6  CL 100 105 103 97* 96*  CO2 28 25 28 28 30   GLUCOSE 91 129* 157* 131* 133*  BUN 9 5* 7 13 16   CREATININE 0.69 0.51 0.61 0.65 0.62  CALCIUM 8.6* 6.9* 7.9* 8.5* 8.8*  MG  --   --  2.2 2.1 2.2  AST 19 16 18 16 15   ALT 14 12 13 14 14   ALKPHOS 50 38 40 46 48  BILITOT 0.3 0.3 0.5 0.6 0.4   ------------------------------------------------------------------------------------------------------------------ Recent Labs    04/09/19 1656  TRIG 83    Lab Results  Component Value Date   HGBA1C (H) 06/16/2009    6.3 (NOTE) The ADA recommends the following  therapeutic goal for glycemic control related to Hgb A1c measurement: Goal of therapy: <6.5 Hgb A1c  Reference: American Diabetes Association: Clinical Practice Recommendations 2010, Diabetes Care, 2010, 33: (Suppl  1).   ------------------------------------------------------------------------------------------------------------------ No results for input(s): TSH, T4TOTAL, T3FREE, THYROIDAB in the last 72 hours.  Invalid input(s): FREET3 ------------------------------------------------------------------------------------------------------------------ Recent Labs    04/11/19 0400 04/12/19 0325  FERRITIN 110 139    Coagulation profile No results for input(s): INR, PROTIME in the last 168 hours.  Recent Labs    04/11/19 0400 04/12/19 0325  DDIMER 0.40 0.29    Cardiac Enzymes No results for input(s): CKMB, TROPONINI, MYOGLOBIN in the last 168 hours.  Invalid input(s): CK ------------------------------------------------------------------------------------------------------------------ No results found for: BNP  Micro Results Recent Results (from the past 240 hour(s))  Novel Coronavirus, NAA ([facility])     Status: Abnormal   Collection Time: 04/05/19 12:00 AM   Specimen: Nasopharyngeal(NP) swabs in vial transport medium   NASOPHARYNGE  TESTING  Result Value Ref Range Status   SARS-CoV-2, NAA Detected (A) Not Detected Final    Comment: This nucleic acid amplification test was developed and its performance characteristics determined by [employer]. Nucleic acid amplification tests include PCR and TMA. This test has not been FDA cleared or approved. This test has been authorized by FDA under an Emergency Use Authorization (EUA). This test is only authorized for the duration of time the declaration that circumstances exist justifying the authorization of the emergency use of in vitro diagnostic tests for detection of SARS-CoV-2 virus and/or diagnosis of COVID-19  infection under section 564(b)(1) of the Act, 21 U.S.C. [id number]) (1), unless the authorization is terminated or revoked sooner. When diagnostic testing is negative, the possibility of a false negative result should be considered in the context of a patient's recent exposures and the presence of clinical signs and symptoms consistent with COVID-19. An individual without symptoms of COVID-19 and who is not shedding SARS-CoV-2 virus would  expect to have a negative (not detected) result in this assay.   Blood Culture (routine x 2)     Status: None (Preliminary result)   Collection Time: 04/09/19  4:56 PM   Specimen: BLOOD  Result Value Ref Range Status   Specimen Description   Final    BLOOD LEFT ANTECUBITAL Performed at [facility] [address], [place], [place] [address]    Special Requests   Final    BOTTLES DRAWN AEROBIC AND ANAEROBIC Blood Culture results may not be optimal due to an excessive volume of blood received in culture bottles Performed at [facility] [address], [place], [place] [address]    Culture   Final    NO GROWTH 3 DAYS Performed at [facility], [place] [address], [place], [place] [address]    Report Status PENDING  Incomplete  Blood Culture (routine x 2)     Status: None (Preliminary result)   Collection Time: 04/09/19  5:11 PM   Specimen: Right Antecubital; Blood  Result Value Ref Range Status   Specimen Description   Final    RIGHT ANTECUBITAL Performed at [facility] [address] [place], [place] [address]    Special Requests   Final    BOTTLES DRAWN AEROBIC AND ANAEROBIC Blood Culture adequate volume Performed at [facility] [address], [place], [place] [address]    Culture   Final    NO GROWTH 3 DAYS Performed at [facility], [place] [address] [place],  [address]    Report Status PENDING  Incomplete    Radiology Reports Dg Chest  2 View  Result Date: 04/04/2019 CLINICAL DATA:  Productive cough, shortness of breath. EXAM: CHEST - 2 VIEW COMPARISON:  June 06, 2017. FINDINGS: The heart size and mediastinal contours are within normal limits. Both lungs are clear. No pneumothorax or pleural effusion is noted. The visualized skeletal structures are unremarkable. IMPRESSION: No active cardiopulmonary disease. Electronically Signed   By: Marijo Conception M.D.   On: 04/04/2019 16:21   Dg Chest Port 1 View  Result Date: 04/09/2019 CLINICAL DATA:  COVID-19 positive. Shortness of breath. EXAM: PORTABLE CHEST 1 VIEW COMPARISON:  April 07, 2019 FINDINGS: There  is patchy airspace opacity in the lung bases with associated mild bibasilar atelectasis. Lungs elsewhere clear. Heart size and pulmonary vascularity are normal. No adenopathy. No bone lesions. IMPRESSION: Bibasilar patchy airspace opacity with associated mild bibasilar atelectasis. Suspect bibasilar pneumonia, likely of atypical organism etiology. Heart size normal.  No adenopathy demonstrable. Electronically Signed   By: Lowella Grip III M.D.   On: 04/09/2019 17:55   Dg Chest Portable 1 View  Result Date: 04/07/2019 CLINICAL DATA:  Shortness of breath EXAM: PORTABLE CHEST 1 VIEW COMPARISON:  Chest radiograph dated 04/04/2019 FINDINGS: The heart size and mediastinal contours are within normal limits. There is minimal left basilar atelectasis. The right lung is clear. There is no pleural effusion or pneumothorax. The visualized skeletal structures are unremarkable. IMPRESSION: Minimal left basilar atelectasis. Electronically Signed   By: Zerita Boers M.D.   On: 04/07/2019 13:11

## 2019-04-12 NOTE — Evaluation (Signed)
Physical Therapy Evaluation Patient Details Name: [name] MRN: [id number] DOB: [birth year] Today's Date: 04/12/2019   History of Present Illness  58 y.o. female admitted on 04/09/19 for SOB and cough.  Dx with COVID 19 with severe hypoxic respiratory failure.  Pt with significant PMH of patella fx, hepatitis C, pelvic fx, COPD (per chart on 3 L O2 at home, per pt PRN during the day and on all night), CAD.     Clinical Impression  Pt is requiring 2 L O2 [place] at all times as she desats to 87% on RA during gait.  She tells me she only wore it at home "before work" and at night while sleeping.  She did not take or wear O2 at work at baseline.  She is mildly unsteady, but not enough to require an assistive device.   PT to follow acutely for deficits listed below.      Follow Up Recommendations No PT follow up;Supervision for mobility/OOB    Equipment Recommendations  None recommended by PT    Recommendations for Other Services   NA    Precautions / Restrictions Precautions Precautions: Other (comment) Precaution Comments: monitor O2      Mobility  Bed Mobility Overal bed mobility: Modified Independent                Transfers Overall transfer level: Needs assistance   Transfers: Sit to/from Stand Sit to Stand: Min guard         General transfer comment: Min guard hand held assist to steady and manage lines   Ambulation/Gait Ambulation/Gait assistance: Min guard Gait Distance (Feet): 40 Feet Assistive device: 1 person hand held assist Gait Pattern/deviations: Step-through pattern;Staggering right;Staggering left     General Gait Details: mildly staggering gait pattern, O2 during gait dropped to 87%, so 2 L O2 [place] applied and pt was able to stay in the low 90s during gait.  I educated that pt that as of right now, she needs to wear her O2 all the time, not just in the AM before work and when she sleeps.           Balance Overall balance assessment: Needs  assistance Sitting-balance support: No upper extremity supported;Feet supported Sitting balance-Leahy Scale: Good     Standing balance support: No upper extremity supported Standing balance-Leahy Scale: Fair                               Pertinent Vitals/Pain Pain Assessment: No/denies pain    Home Living Family/patient expects to be discharged to:: Private residence Living Arrangements: Children(son) Available Help at Discharge: Family;Available 24 hours/day(if needed) Type of Home: House Home Access: Stairs to enter     Home Layout: Two level;Bed/bath upstairs Home Equipment: Other (comment)(O2 3 L per pt "before she goes to work" and at night)      Prior Function Level of Independence: Independent         Comments: drives, works full time at [employer] of the day     [employer]        Extremity/Trunk Assessment   Upper Extremity Assessment Upper Extremity Assessment: Overall WFL for tasks assessed    Lower Extremity Assessment Lower Extremity Assessment: Overall WFL for tasks assessed    Cervical / Trunk Assessment Cervical / Trunk Assessment: Normal  Communication   Communication: No difficulties  Cognition Arousal/Alertness: Awake/alert Behavior During Therapy: WFL for tasks assessed/performed Overall Cognitive  Status: No family/caregiver present to determine baseline cognitive functioning                                 General Comments: Seems like there may be baseline cog deficits, maybe developmental delay         Exercises General Exercises - Upper Extremity Shoulder ABduction: AROM;Both;10 reps;Seated;Theraband Theraband Level (Shoulder Abduction): Level 1 (Yellow) Shoulder Horizontal ABduction: AROM;Both;10 reps;Seated;Theraband Theraband Level (Shoulder Horizontal Abduction): Level 1 (Yellow) Elbow Flexion: AROM;Both;10 reps;Seated;Theraband Theraband Level (Elbow Flexion): Level 1  (Yellow) Elbow Extension: AROM;Both;10 reps;Seated;Theraband Theraband Level (Elbow Extension): Level 1 (Yellow) General Exercises - Lower Extremity Long Arc Quad: AROM;Both;10 reps Hip ABduction/ADduction: AROM;Both;10 reps Straight Leg Raises: AROM;Both;10 reps Hip Flexion/Marching: AROM;Both;10 reps Other Exercises Other Exercises: Incentive spirometer x 10 reps 500 mL max inspired volume, flutter valve x 10 reps.    Assessment/Plan    PT Assessment Patient needs continued PT services  PT Problem List Decreased strength;Decreased activity tolerance;Decreased balance;Decreased mobility;Decreased knowledge of use of DME;Cardiopulmonary status limiting activity       PT Treatment Interventions DME instruction;Gait training;Stair training;Functional mobility training;Therapeutic activities;Therapeutic exercise;Balance training;Patient/family education    PT Goals (Current goals can be found in the Care Plan section)  Acute Rehab PT Goals Patient Stated Goal: to get home PT Goal Formulation: With patient Time For Goal Achievement: 04/26/19 Potential to Achieve Goals: Good    Frequency Min 3X/week           AM-PAC PT "6 Clicks" Mobility  Outcome Measure Help needed turning from your back to your side while in a flat bed without using bedrails?: None Help needed moving from lying on your back to sitting on the side of a flat bed without using bedrails?: None Help needed moving to and from a bed to a chair (including a wheelchair)?: A Little Help needed standing up from a chair using your arms (e.g., wheelchair or bedside chair)?: A Little Help needed to walk in hospital room?: A Little Help needed climbing 3-5 steps with a railing? : A Little 6 Click Score: 20    End of Session Equipment Utilized During Treatment: Oxygen Activity Tolerance: Patient tolerated treatment well Patient left: in chair;with call bell/phone within reach   PT Visit Diagnosis: Muscle weakness  (generalized) (M62.81);Difficulty in walking, not elsewhere classified (R26.2)    Time: [id number] PT Time Calculation (min) (ACUTE ONLY): 29 min   Charges:         Verdene Lennert, PT, DPT  Acute Rehabilitation [phone] pager #[phone] office  @ [facility]: [phone]   PT Evaluation $PT Eval Moderate Complexity: 1 Mod PT Treatments $Gait Training: 8-22 mins      04/12/2019, 4:31 PM

## 2019-04-13 LAB — COMPREHENSIVE METABOLIC PANEL
ALT: 15 U/L (ref 0–44)
AST: 15 U/L (ref 15–41)
Albumin: 3.3 g/dL — ABNORMAL LOW (ref 3.5–5.0)
Alkaline Phosphatase: 49 U/L (ref 38–126)
Anion gap: 12 (ref 5–15)
BUN: 18 mg/dL (ref 6–20)
CO2: 30 mmol/L (ref 22–32)
Calcium: 8.6 mg/dL — ABNORMAL LOW (ref 8.9–10.3)
Chloride: 95 mmol/L — ABNORMAL LOW (ref 98–111)
Creatinine, Ser: 0.64 mg/dL (ref 0.44–1.00)
GFR calc Af Amer: >60 mL/min (ref >60)
GFR calc non Af Amer: >60 mL/min (ref >60)
Glucose, Bld: 132 mg/dL — ABNORMAL HIGH (ref 70–99)
Potassium: 3.6 mmol/L (ref 3.5–5.1)
Sodium: 137 mmol/L (ref 135–145)
Total Bilirubin: 0.2 mg/dL — ABNORMAL LOW (ref 0.3–1.2)
Total Protein: 6.9 g/dL (ref 6.5–8.1)

## 2019-04-13 LAB — CBC WITH DIFFERENTIAL/PLATELET
Abs Immature Granulocytes: 0.03 10*3/uL (ref 0.00–0.07)
Basophils Absolute: 0 10*3/uL (ref 0.0–0.1)
Basophils Relative: 0 %
Eosinophils Absolute: 0 10*3/uL (ref 0.0–0.5)
Eosinophils Relative: 0 %
HCT: 40.2 % (ref 36.0–46.0)
Hemoglobin: 12.8 g/dL (ref 12.0–15.0)
Immature Granulocytes: 1 %
Lymphocytes Relative: 10 %
Lymphs Abs: 0.5 10*3/uL — ABNORMAL LOW (ref 0.7–4.0)
MCH: 28.1 pg (ref 26.0–34.0)
MCHC: 31.8 g/dL (ref 30.0–36.0)
MCV: 88.4 fL (ref 80.0–100.0)
Monocytes Absolute: 0.1 10*3/uL (ref 0.1–1.0)
Monocytes Relative: 3 %
Neutro Abs: 4.6 10*3/uL (ref 1.7–7.7)
Neutrophils Relative %: 86 %
Platelets: 308 10*3/uL (ref 150–400)
RBC: 4.55 MIL/uL (ref 3.87–5.11)
RDW: 13.6 % (ref 11.5–15.5)
WBC: 5.3 10*3/uL (ref 4.0–10.5)
nRBC: 0 % (ref 0.0–0.2)

## 2019-04-13 LAB — D-DIMER, QUANTITATIVE: D-Dimer, Quant: <0.27 ug/mL-FEU (ref 0.00–0.50)

## 2019-04-13 LAB — FERRITIN: Ferritin: 93 ng/mL (ref 11–307)

## 2019-04-13 LAB — C-REACTIVE PROTEIN: CRP: 1.1 mg/dL — ABNORMAL HIGH (ref <1.0)

## 2019-04-13 LAB — MAGNESIUM: Magnesium: 2.5 mg/dL — ABNORMAL HIGH (ref 1.7–2.4)

## 2019-04-13 MED ORDER — POLYETHYLENE GLYCOL 3350 17 G PO PACK
17.0000 g | PACK | Freq: Four times a day (QID) | ORAL | Status: AC
  Administered 2019-04-13 (×2): 17 g via ORAL
  Filled 2019-04-13 (×2): qty 1

## 2019-04-13 NOTE — Progress Notes (Signed)
PROGRESS NOTE                                                                                                                                                                                                             Patient Demographics:    [name], is a 58 y.o. female, DOB - 10/19/1960, [id number]  Outpatient Primary MD for the patient is [name], Tami Beard, MD   Admit date - 04/09/2019   LOS - 4  Chief Complaint  Patient presents with   COVID   Shortness of Breath       Brief Narrative: Patient is a 58 y.o. female with PMHx of COPD, chronic hypoxic respiratory failure on home O2 (3 L/min), CAD being managed medically, GERD, hep C, dyslipidemia-who presented with shortness of breath and cough-found to have severe hypoxic respiratory failure secondary to COVID-19.   Subjective:    [name] continues to slowly improve-she has less shortness of breath.  Complains of constipation.   Assessment  & Plan :   Acute on chronic hypoxic Resp Failure due to Covid 19 Viral pneumonia: Continues to improve-stable on 2-3 L of oxygen-continue steroids and remdesivir.  Remdesivir last dose on 11/28.    Fever: afebrile  O2 requirements:  SpO2: 91 % O2 Flow Rate (L/min): 3 L/min   COVID-19 Labs: Recent Labs    04/11/19 0400 04/12/19 0325 04/13/19 0211  DDIMER 0.40 0.29 <0.27  FERRITIN 110 139 93  CRP 6.1* 2.3* 1.1*    Lab Results  Component Value Date   SARSCOV2NAA Detected (A) 04/05/2019     COVID-19 Medications: Steroids: 11/24>> Remdesivir: 11/24>> Actemra: Not indicated given clinical improvement Convalescent Plasma: Indicated given clinical improvement  Other medications: Diuretics:Euvolemic Antibiotics:Not needed as no evidence of bacterial infection stop Rocephin/Zithromax on 11/25  Prone/Incentive Spirometry: encouraged  incentive spirometry use 3-4/hour.  DVT Prophylaxis  :   Lovenox   Constipation: MiraLAX x2 today-reeval tomorrow.  Hypokalemia: Repleted  COPD with chronic hypoxic respiratory failure on home O2: Stable-on 3 L of oxygen at home-continue bronchodilators  CAD: No anginal symptoms-managed medically  HTN: Blood pressure controlled without the use of antihypertensives-hold amlodipine for now.  GERD: Continue Pepcid  History of hepatitis C: Reviewed prior records-had ID consultation in 2016-since patient had antibody positive without a viral load-looks like she had cleared the infection.  Obesity:  Estimated body mass index is 30.81 kg/m as calculated from the following:   Height as of this encounter: 5\' 3"  (1.6 m).   Weight as of this encounter: 78.9 kg.   Consults  :  None  Procedures  :  None  ABG:    Component Value Date/Time   PHART 7.384 04/09/2019 1845   PCO2ART 49.1 (H) 04/09/2019 1845   PO2ART 114 (H) 04/09/2019 1845   HCO3 28.7 (H) 04/09/2019 1845   TCO2 22.3 02/07/2012 1111   ACIDBASEDEF 0.9 06/11/2009 1200   O2SAT 98.0 04/09/2019 1845    Vent Settings: N/A  Condition - Stable  Family Communication  : Spoke with patient's son over the phone on 11/28  Code Status :  Full Code  Diet :  Diet Order            Diet Heart Room service appropriate? Yes; Fluid consistency: Thin  Diet effective now               Disposition Plan  :  Remain hospitalized-Home likely on 11/29  Barriers to discharge: Hypoxia requiring O2 supplementation/complete 5 days of IV Remdesivir  Antimicorbials  :    Anti-infectives (From admission, onward)   Start     Dose/Rate Route Frequency Ordered Stop   04/11/19 1000  remdesivir 100 mg in sodium chloride 0.9 % 250 mL IVPB     100 mg 500 mL/hr over 30 Minutes Intravenous Daily 04/09/19 2003 04/14/19 0959   04/10/19 2000  cefTRIAXone (ROCEPHIN) 2 g in sodium chloride 0.9 % 100 mL IVPB  Status:  Discontinued     2 g 200 mL/hr over 30 Minutes Intravenous Every 24 hours 04/10/19 0549  04/10/19 1527   04/10/19 2000  azithromycin (ZITHROMAX) 500 mg in sodium chloride 0.9 % 250 mL IVPB  Status:  Discontinued     500 mg 250 mL/hr over 60 Minutes Intravenous Every 24 hours 04/10/19 0549 04/10/19 1527   04/10/19 1600  remdesivir 100 mg in sodium chloride 0.9 % 250 mL IVPB     100 mg 500 mL/hr over 30 Minutes Intravenous  Once 04/09/19 2003 04/10/19 1729   04/09/19 2015  remdesivir 200 mg in sodium chloride 0.9 % 250 mL IVPB     200 mg 500 mL/hr over 30 Minutes Intravenous Once 04/09/19 2003 04/09/19 2225   04/09/19 1815  cefTRIAXone (ROCEPHIN) 2 g in sodium chloride 0.9 % 100 mL IVPB  Status:  Discontinued     2 g 200 mL/hr over 30 Minutes Intravenous Every 24 hours 04/09/19 1805 04/10/19 0556   04/09/19 1815  azithromycin (ZITHROMAX) 500 mg in sodium chloride 0.9 % 250 mL IVPB  Status:  Discontinued     500 mg 250 mL/hr over 60 Minutes Intravenous Every 24 hours 04/09/19 1805 04/10/19 0556      Inpatient Medications  Scheduled Meds:  albuterol  2 puff Inhalation Q6H   clopidogrel  75 mg Oral Daily   enoxaparin (LOVENOX) injection  40 mg Subcutaneous Q24H   famotidine  20 mg Oral Daily   umeclidinium-vilanterol  1 puff Inhalation Daily   And   fluticasone  1 puff Inhalation Daily   methylPREDNISolone (SOLU-MEDROL) injection  40 mg Intravenous Q12H   polyethylene glycol  17 g Oral Q6H   Continuous Infusions:  sodium chloride     sodium chloride Stopped (04/10/19 1000)   remdesivir 100 mg in NS 250 mL 100 mg (04/12/19 0920)   PRN Meds:.sodium chloride, chlorpheniramine-HYDROcodone, guaiFENesin-dextromethorphan, nitroGLYCERIN, ondansetron **OR**  ondansetron (ZOFRAN) IV   Time Spent in minutes  25  See all Orders from today for further details   Oren Binet M.D on 04/13/2019 at 10:08 AM  To page go to [email or web address] - use universal password  [facility] -  Office  [phone]    Objective:   Vitals:   04/12/19 1818 04/12/19 1950  04/13/19 0015 04/13/19 0429  BP: 117/74 115/72 116/74 110/77  Pulse: 76 66 (!) 58 66  Resp: (!) 22 (!) 21 13 (!) 23  Temp: 98.3 F (36.8 C) 98.8 F (37.1 C) 98.3 F (36.8 C) 98.8 F (37.1 C)  TempSrc: Oral Oral Oral Oral  SpO2: 96% 97% 92% 91%  Weight:      Height:        Wt Readings from Last 3 Encounters:  04/10/19 78.9 kg  04/04/19 79.4 kg  03/04/19 79.4 kg     Intake/Output Summary (Last 24 hours) at 04/13/2019 1008 Last data filed at 04/13/2019 0700 Gross per 24 hour  Intake 920 ml  Output 2300 ml  Net -1380 ml     Physical Exam Gen Exam:Alert awake-not in any distress HEENT:atraumatic, normocephalic Chest: B/L clear to auscultation anteriorly CVS:S1S2 regular Abdomen:soft non tender, non distended Extremities:no edema Neurology: Non focal Skin: no rash   Data Review:    CBC Recent Labs  Lab 04/09/19 1656 04/10/19 0700 04/11/19 0400 04/12/19 0325 04/13/19 0211  WBC 6.1 4.7 5.1 5.1 5.3  HGB 10.7* 10.3* 11.7* 12.5 12.8  HCT 34.9* 33.4* 36.2 38.9 40.2  PLT 212 201 243 273 308  MCV 92.8 92.0 89.4 89.2 88.4  MCH 28.5 28.4 28.9 28.7 28.1  MCHC 30.7 30.8 32.3 32.1 31.8  RDW 14.2 14.5 13.9 13.9 13.6  LYMPHSABS 0.5* 0.3* 0.4* 0.5* 0.5*  MONOABS 0.2 0.1 0.2 0.2 0.1  EOSABS 0.0 0.0 0.0 0.0 0.0  BASOSABS 0.0 0.0 0.0 0.0 0.0    Chemistries  Recent Labs  Lab 04/09/19 1656 04/10/19 0700 04/11/19 0400 04/12/19 0325 04/13/19 0211  NA 138 140 136 140 137  K 2.3* 3.8 3.7 3.6 3.6  CL 105 103 97* 96* 95*  CO2 25 28 28 30 30   GLUCOSE 129* 157* 131* 133* 132*  BUN 5* 7 13 16 18   CREATININE 0.51 0.61 0.65 0.62 0.64  CALCIUM 6.9* 7.9* 8.5* 8.8* 8.6*  MG  --  2.2 2.1 2.2 2.5*  AST 16 18 16 15 15   ALT 12 13 14 14 15   ALKPHOS 38 40 46 48 49  BILITOT 0.3 0.5 0.6 0.4 0.2*   ------------------------------------------------------------------------------------------------------------------ No results for input(s): CHOL, HDL, LDLCALC, TRIG, CHOLHDL,  LDLDIRECT in the last 72 hours.  Lab Results  Component Value Date   HGBA1C (H) 06/16/2009    6.3 (NOTE) The ADA recommends the following therapeutic goal for glycemic control related to Hgb A1c measurement: Goal of therapy: <6.5 Hgb A1c  Reference: American Diabetes Association: Clinical Practice Recommendations 2010, Diabetes Care, 2010, 33: (Suppl  1).   ------------------------------------------------------------------------------------------------------------------ No results for input(s): TSH, T4TOTAL, T3FREE, THYROIDAB in the last 72 hours.  Invalid input(s): FREET3 ------------------------------------------------------------------------------------------------------------------ Recent Labs    04/12/19 0325 04/13/19 0211  FERRITIN 139 93    Coagulation profile No results for input(s): INR, PROTIME in the last 168 hours.  Recent Labs    04/12/19 0325 04/13/19 0211  DDIMER 0.29 <0.27    Cardiac Enzymes No results for input(s): CKMB, TROPONINI, MYOGLOBIN in the last 168 hours.  Invalid input(s): CK ------------------------------------------------------------------------------------------------------------------ No results found  for: BNP  Micro Results Recent Results (from the past 240 hour(s))  Novel Coronavirus, NAA ([facility])     Status: Abnormal   Collection Time: 04/05/19 12:00 AM   Specimen: Nasopharyngeal(NP) swabs in vial transport medium   NASOPHARYNGE  TESTING  Result Value Ref Range Status   SARS-CoV-2, NAA Detected (A) Not Detected Final    Comment: This nucleic acid amplification test was developed and its performance characteristics determined by [employer]. Nucleic acid amplification tests include PCR and TMA. This test has not been FDA cleared or approved. This test has been authorized by FDA under an Emergency Use Authorization (EUA). This test is only authorized for the duration of time the declaration that circumstances  exist justifying the authorization of the emergency use of in vitro diagnostic tests for detection of SARS-CoV-2 virus and/or diagnosis of COVID-19 infection under section 564(b)(1) of the Act, 21 U.S.C. [id number]) (1), unless the authorization is terminated or revoked sooner. When diagnostic testing is negative, the possibility of a false negative result should be considered in the context of a patient's recent exposures and the presence of clinical signs and symptoms consistent with COVID-19. An individual without symptoms of COVID-19 and who is not shedding SARS-CoV-2 virus would  expect to have a negative (not detected) result in this assay.   Blood Culture (routine x 2)     Status: None (Preliminary result)   Collection Time: 04/09/19  4:56 PM   Specimen: BLOOD  Result Value Ref Range Status   Specimen Description   Final    BLOOD LEFT ANTECUBITAL Performed at [facility] [address] [place], [place] [address]    Special Requests   Final    BOTTLES DRAWN AEROBIC AND ANAEROBIC Blood Culture results may not be optimal due to an excessive volume of blood received in culture bottles Performed at [facility] [address] [place], [place] [address]    Culture   Final    NO GROWTH 3 DAYS Performed at [facility], [place] [address] [place], [place] [address]    Report Status PENDING  Incomplete  Blood Culture (routine x 2)     Status: None (Preliminary result)   Collection Time: 04/09/19  5:11 PM   Specimen: Right Antecubital; Blood  Result Value Ref Range Status   Specimen Description   Final    RIGHT ANTECUBITAL Performed at [facility] [address] [place], [place] [address]    Special Requests   Final    BOTTLES DRAWN AEROBIC AND ANAEROBIC Blood Culture adequate volume Performed at [facility] [address] [place], [place] [address]    Culture   Final    NO GROWTH 3  DAYS Performed at [facility], [place] [address] [place], [place] [address]    Report Status PENDING  Incomplete    Radiology Reports Dg Chest 2 View  Result Date: 04/04/2019 CLINICAL DATA:  Productive cough, shortness of breath. EXAM: CHEST - 2 VIEW COMPARISON:  June 06, 2017. FINDINGS: The heart size and mediastinal contours are within normal limits. Both lungs are clear. No pneumothorax or pleural effusion is noted. The visualized skeletal structures are unremarkable. IMPRESSION: No active cardiopulmonary disease. Electronically Signed   By: Marijo Conception M.D.   On: 04/04/2019 16:21   Dg Chest Port 1 View  Result Date: 04/09/2019 CLINICAL DATA:  COVID-19 positive. Shortness of breath. EXAM: PORTABLE CHEST 1 VIEW COMPARISON:  April 07, 2019 FINDINGS: There is patchy airspace opacity in the lung bases with associated mild bibasilar atelectasis. Lungs elsewhere clear. Heart size and pulmonary vascularity are normal. No adenopathy. No bone lesions. IMPRESSION: Bibasilar patchy airspace opacity with associated mild bibasilar atelectasis. Suspect bibasilar pneumonia, likely of atypical organism etiology. Heart size normal.  No adenopathy demonstrable. Electronically Signed   By: Lowella Grip III M.D.   On: 04/09/2019 17:55   Dg Chest Portable 1 View  Result Date: 04/07/2019 CLINICAL DATA:  Shortness of breath EXAM: PORTABLE CHEST 1 VIEW COMPARISON:  Chest radiograph dated 04/04/2019 FINDINGS: The heart size and mediastinal contours are within normal limits. There is minimal left basilar atelectasis. The right lung is clear. There is no pleural effusion or pneumothorax. The visualized skeletal structures are unremarkable. IMPRESSION: Minimal left basilar atelectasis. Electronically Signed   By: Zerita Boers M.D.   On: 04/07/2019 13:11

## 2019-04-13 NOTE — Progress Notes (Signed)
Pt. Arrived at 319 stable. Orders reviewed.

## 2019-04-14 LAB — CBC WITH DIFFERENTIAL/PLATELET
Abs Immature Granulocytes: 0.06 10*3/uL (ref 0.00–0.07)
Basophils Absolute: 0 10*3/uL (ref 0.0–0.1)
Basophils Relative: 0 %
Eosinophils Absolute: 0 10*3/uL (ref 0.0–0.5)
Eosinophils Relative: 0 %
HCT: 40.3 % (ref 36.0–46.0)
Hemoglobin: 13.1 g/dL (ref 12.0–15.0)
Immature Granulocytes: 1 %
Lymphocytes Relative: 10 %
Lymphs Abs: 0.7 10*3/uL (ref 0.7–4.0)
MCH: 28.6 pg (ref 26.0–34.0)
MCHC: 32.5 g/dL (ref 30.0–36.0)
MCV: 88 fL (ref 80.0–100.0)
Monocytes Absolute: 0.5 10*3/uL (ref 0.1–1.0)
Monocytes Relative: 7 %
Neutro Abs: 6.1 10*3/uL (ref 1.7–7.7)
Neutrophils Relative %: 82 %
Platelets: 330 10*3/uL (ref 150–400)
RBC: 4.58 MIL/uL (ref 3.87–5.11)
RDW: 13.6 % (ref 11.5–15.5)
WBC: 7.4 10*3/uL (ref 4.0–10.5)
nRBC: 0 % (ref 0.0–0.2)

## 2019-04-14 LAB — COMPREHENSIVE METABOLIC PANEL
ALT: 14 U/L (ref 0–44)
AST: 13 U/L — ABNORMAL LOW (ref 15–41)
Albumin: 3.1 g/dL — ABNORMAL LOW (ref 3.5–5.0)
Alkaline Phosphatase: 42 U/L (ref 38–126)
Anion gap: 8 (ref 5–15)
BUN: 16 mg/dL (ref 6–20)
CO2: 29 mmol/L (ref 22–32)
Calcium: 8.4 mg/dL — ABNORMAL LOW (ref 8.9–10.3)
Chloride: 97 mmol/L — ABNORMAL LOW (ref 98–111)
Creatinine, Ser: 0.61 mg/dL (ref 0.44–1.00)
GFR calc Af Amer: >60 mL/min (ref >60)
GFR calc non Af Amer: >60 mL/min (ref >60)
Glucose, Bld: 112 mg/dL — ABNORMAL HIGH (ref 70–99)
Potassium: 4.2 mmol/L (ref 3.5–5.1)
Sodium: 134 mmol/L — ABNORMAL LOW (ref 135–145)
Total Bilirubin: 0.7 mg/dL (ref 0.3–1.2)
Total Protein: 6.3 g/dL — ABNORMAL LOW (ref 6.5–8.1)

## 2019-04-14 LAB — MAGNESIUM: Magnesium: 2.4 mg/dL (ref 1.7–2.4)

## 2019-04-14 LAB — CULTURE, BLOOD (ROUTINE X 2)
Culture: NO GROWTH
Culture: NO GROWTH
Special Requests: ADEQUATE

## 2019-04-14 LAB — C-REACTIVE PROTEIN: CRP: <0.8 mg/dL (ref <1.0)

## 2019-04-14 LAB — FERRITIN: Ferritin: 74 ng/mL (ref 11–307)

## 2019-04-14 LAB — D-DIMER, QUANTITATIVE: D-Dimer, Quant: <0.27 ug/mL-FEU (ref 0.00–0.50)

## 2019-04-14 MED ORDER — PREDNISONE 10 MG PO TABS: ORAL_TABLET | ORAL | 0 refills | Status: DC

## 2019-04-14 NOTE — Discharge Summary (Signed)
PATIENT DETAILS Name: [name] Age: 58 y.o. Sex: female Date of Birth: 10/10/1960 MRN: [id number]. Admitting Physician: Elwyn Reach, MD [id number], Iona Beard, MD  Admit Date: 04/09/2019 Discharge date: 04/14/2019  Recommendations for Outpatient Follow-up:  1. Follow up with PCP in 1-2 weeks 2. Please obtain CMP/CBC in one week 3. Repeat Chest Xray in 4-6 week   Admitted From:  Home  Disposition: [place]: No  Equipment/Devices: Resume oxygen 3L  Discharge Condition: Stable  CODE STATUS: FULL CODE  Diet recommendation:  Diet Order            Diet - low sodium heart healthy        Diet Heart Room service appropriate? Yes; Fluid consistency: Thin  Diet effective now               Brief Summary: See H&P, Labs, Consult and Test reports for all details in brief, Patient is a 58 y.o. female with PMHx of COPD, chronic hypoxic respiratory failure on home O2 (3 L/min), CAD being managed medically, GERD, hep C, dyslipidemia-who presented with shortness of breath and cough-found to have severe hypoxic respiratory failure secondary to COVID-19.  Brief Hospital Course: Acute on chronic hypoxic Resp Failure due to Covid 19 Viral pneumonia: Continues to improve-now stable/back to baseline on 2-3 L of oxygen-Treated with steroids and remdesivir.  Remdesivir last dose on 11/28.Continue tapering steroids on discharge  COVID-19 Labs:  Recent Labs    04/12/19 0325 04/13/19 0211 04/14/19 0630  DDIMER 0.29 <0.27 <0.27  FERRITIN 139 93  --   CRP 2.3* 1.1*  --     Lab Results  Component Value Date   SARSCOV2NAA Detected (A) 04/05/2019     COVID-19 Medications: Steroids: 11/24>> Remdesivir: 11/24>> Actemra: Not indicated given clinical improvement Convalescent Plasma: Not Indicated given clinical improvement  Hypokalemia: Repleted  COPD with chronic hypoxic respiratory failure on home O2: Stable-on 3 L of oxygen at home-continue  bronchodilators  CAD: No anginal symptoms-managed medically  HTN: Blood pressure controlled-resume  amlodipine   GERD: Continue Pepcid  History of hepatitis C: Reviewed prior records-had ID consultation in 2016-since patient had antibody positive without a viral load-looks like she had cleared the infection.  Obesity: Estimated body mass index is 30.81 kg/m as calculated from the following:   Height as of this encounter: 5\' 3"  (1.6 m).   Weight as of this encounter: 78.9 kg.     Procedures/Studies: None  Discharge Diagnoses:  Principal Problem:   Pneumonia due to COVID-19 virus Active Problems:   COPD (chronic obstructive pulmonary disease) (HCC)   GERD (gastroesophageal reflux disease)   Substance abuse (HCC)   History of cocaine abuse (HCC)   CAD (coronary artery disease)   Hyperlipidemia   Discharge Instructions:    Person Under Monitoring Name: [name]  Location: [address] [place] [address]   Infection Prevention Recommendations for Individuals Confirmed to have, or Being Evaluated for, 2019 Novel Coronavirus (COVID-19) Infection Who Receive Care at Home  Individuals who are confirmed to have, or are being evaluated for, COVID-19 should follow the prevention steps below until a healthcare provider or local or state health department says they can return to normal activities.  Stay home except to get medical care You should restrict activities outside your home, except for getting medical care. Do not go to work, school, or public areas, and do not use public transportation or taxis.  Call ahead before visiting your doctor Before your  medical appointment, call the healthcare provider and tell them that you have, or are being evaluated for, COVID-19 infection. This will help the healthcare provider's office take steps to keep other people from getting infected. Ask your healthcare provider to call the local or state health  department.  Monitor your symptoms Seek prompt medical attention if your illness is worsening (e.g., difficulty breathing). Before going to your medical appointment, call the healthcare provider and tell them that you have, or are being evaluated for, COVID-19 infection. Ask your healthcare provider to call the local or state health department.  Wear a facemask You should wear a facemask that covers your nose and mouth when you are in the same room with other people and when you visit a healthcare provider. People who live with or visit you should also wear a facemask while they are in the same room with you.  Separate yourself from other people in your home As much as possible, you should stay in a different room from other people in your home. Also, you should use a separate bathroom, if available.  Avoid sharing household items You should not share dishes, drinking glasses, cups, eating utensils, towels, bedding, or other items with other people in your home. After using these items, you should wash them thoroughly with soap and water.  Cover your coughs and sneezes Cover your mouth and nose with a tissue when you cough or sneeze, or you can cough or sneeze into your sleeve. Throw used tissues in a lined trash can, and immediately wash your hands with soap and water for at least 20 seconds or use an alcohol-based hand rub.  Wash your [facility] your hands often and thoroughly with soap and water for at least 20 seconds. You can use an alcohol-based hand sanitizer if soap and water are not available and if your hands are not visibly dirty. Avoid touching your eyes, nose, and mouth with unwashed hands.   Prevention Steps for Caregivers and Household Members of Individuals Confirmed to have, or Being Evaluated for, COVID-19 Infection Being Cared for in the Home  If you live with, or provide care at home for, a person confirmed to have, or being evaluated for, COVID-19 infection  please follow these guidelines to prevent infection:  Follow healthcare provider's instructions Make sure that you understand and can help the patient follow any healthcare provider instructions for all care.  Provide for the patient's basic needs You should help the patient with basic needs in the home and provide support for getting groceries, prescriptions, and other personal needs.  Monitor the patient's symptoms If they are getting sicker, call his or her medical provider and tell them that the patient has, or is being evaluated for, COVID-19 infection. This will help the healthcare provider's office take steps to keep other people from getting infected. Ask the healthcare provider to call the local or state health department.  Limit the number of people who have contact with the patient  If possible, have only one caregiver for the patient.  Other household members should stay in another home or place of residence. If this is not possible, they should stay  in another room, or be separated from the patient as much as possible. Use a separate bathroom, if available.  Restrict visitors who do not have an essential need to be in the home.  Keep older adults, very young children, and other sick people away from the patient Keep older adults, very young children, and those  who have compromised immune systems or chronic health conditions away from the patient. This includes people with chronic heart, lung, or kidney conditions, diabetes, and cancer.  Ensure good ventilation Make sure that shared spaces in the home have good air flow, such as from an air conditioner or an opened window, weather permitting.  Wash your hands often  Wash your hands often and thoroughly with soap and water for at least 20 seconds. You can use an alcohol based hand sanitizer if soap and water are not available and if your hands are not visibly dirty.  Avoid touching your eyes, nose, and mouth with  unwashed hands.  Use disposable paper towels to dry your hands. If not available, use dedicated cloth towels and replace them when they become wet.  Wear a facemask and gloves  Wear a disposable facemask at all times in the room and gloves when you touch or have contact with the patient's blood, body fluids, and/or secretions or excretions, such as sweat, saliva, sputum, nasal mucus, vomit, urine, or feces.  Ensure the mask fits over your nose and mouth tightly, and do not touch it during use.  Throw out disposable facemasks and gloves after using them. Do not reuse.  Wash your hands immediately after removing your facemask and gloves.  If your personal clothing becomes contaminated, carefully remove clothing and launder. Wash your hands after handling contaminated clothing.  Place all used disposable facemasks, gloves, and other waste in a lined container before disposing them with other household waste.  Remove gloves and wash your hands immediately after handling these items.  Do not share dishes, glasses, or other household items with the patient  Avoid sharing household items. You should not share dishes, drinking glasses, cups, eating utensils, towels, bedding, or other items with a patient who is confirmed to have, or being evaluated for, COVID-19 infection.  After the person uses these items, you should wash them thoroughly with soap and water.  Wash laundry thoroughly  Immediately remove and wash clothes or bedding that have blood, body fluids, and/or secretions or excretions, such as sweat, saliva, sputum, nasal mucus, vomit, urine, or feces, on them.  Wear gloves when handling laundry from the patient.  Read and follow directions on labels of laundry or clothing items and detergent. In general, wash and dry with the warmest temperatures recommended on the label.  Clean all areas the individual has used often  Clean all touchable surfaces, such as counters, tabletops,  doorknobs, bathroom fixtures, toilets, phones, keyboards, tablets, and bedside tables, every day. Also, clean any surfaces that may have blood, body fluids, and/or secretions or excretions on them.  Wear gloves when cleaning surfaces the patient has come in contact with.  Use a diluted bleach solution (e.g., dilute bleach with 1 part bleach and 10 parts water) or a household disinfectant with a label that says EPA-registered for coronaviruses. To make a bleach solution at home, add 1 tablespoon of bleach to 1 quart (4 cups) of water. For a larger supply, add  cup of bleach to 1 gallon (16 cups) of water.  Read labels of cleaning products and follow recommendations provided on product labels. Labels contain instructions for safe and effective use of the cleaning product including precautions you should take when applying the product, such as wearing gloves or eye protection and making sure you have good ventilation during use of the product.  Remove gloves and wash hands immediately after cleaning.  Monitor yourself for signs and symptoms of  illness Caregivers and household members are considered close contacts, should monitor their health, and will be asked to limit movement outside of the home to the extent possible. Follow the monitoring steps for close contacts listed on the symptom monitoring form.   ? If you have additional questions, contact your local health department or call the epidemiologist on call at [phone] (available 24/7). ? This guidance is subject to change. For the most up-to-date guidance from CDC, please refer to their website: YouBlogs.pl    Activity:  As tolerated   Discharge Instructions    Call MD for:  difficulty breathing, headache or visual disturbances   Complete by: As directed    Call MD for:  extreme fatigue   Complete by: As directed    Call MD for:  persistant dizziness or  light-headedness   Complete by: As directed    Call MD for:  persistant nausea and vomiting   Complete by: As directed    Diet - low sodium heart healthy   Complete by: As directed    Discharge instructions   Complete by: As directed    Follow with Primary MD  Benito Mccreedy, MD in 1-2 weeks  Please get a complete blood count and chemistry panel checked by your Primary MD at your next visit, and again as instructed by your Primary MD.  Get Medicines reviewed and adjusted: Please take all your medications with you for your next visit with your Primary MD  Laboratory/radiological data: Please request your Primary MD to go over all hospital tests and procedure/radiological results at the follow up, please ask your Primary MD to get all Hospital records sent to his/her office.  In some cases, they will be blood work, cultures and biopsy results pending at the time of your discharge. Please request that your primary care M.D. follows up on these results.  Also Note the following: If you experience worsening of your admission symptoms, develop shortness of breath, life threatening emergency, suicidal or homicidal thoughts you must seek medical attention immediately by calling 911 or calling your MD immediately  if symptoms less severe.  You must read complete instructions/literature along with all the possible adverse reactions/side effects for all the Medicines you take and that have been prescribed to you. Take any new Medicines after you have completely understood and accpet all the possible adverse reactions/side effects.   Do not drive when taking Pain medications or sleeping medications (Benzodaizepines)  Do not take more than prescribed Pain, Sleep and Anxiety Medications. It is not advisable to combine anxiety,sleep and pain medications without talking with your primary care practitioner  Special Instructions: If you have smoked or chewed Tobacco  in the last 2 yrs please stop  smoking, stop any regular Alcohol  and or any Recreational drug use.  Wear Seat belts while driving.  Please note: You were cared for by a hospitalist during your hospital stay. Once you are discharged, your primary care physician will handle any further medical issues. Please note that NO REFILLS for any discharge medications will be authorized once you are discharged, as it is imperative that you return to your primary care physician (or establish a relationship with a primary care physician if you do not have one) for your post hospital discharge needs so that they can reassess your need for medications and monitor your lab values.   3 weeks of isolation/quarantine from 11/20 (day of first positive covid test)   Increase activity slowly   Complete by: As  directed      Allergies as of 04/14/2019      Reactions   Tylenol [acetaminophen] Shortness Of Breath      Medication List    TAKE these medications   albuterol 108 (90 Base) MCG/ACT inhaler Commonly known as: VENTOLIN HFA TAKE 2 PUFFS BY MOUTH EVERY 6 HOURS AS NEEDED FOR WHEEZE OR SHORTNESS OF BREATH What changed: See the new instructions.   albuterol (2.5 MG/3ML) 0.083% nebulizer solution Commonly known as: PROVENTIL Take 3 mLs (2.5 mg total) by nebulization every 6 (six) hours. What changed:   when to take this  reasons to take this   amLODipine 5 MG tablet Commonly known as: NORVASC Take 5 mg by mouth daily.   clopidogrel 75 MG tablet Commonly known as: PLAVIX TAKE 1 TABLET BY MOUTH EVERY DAY   famotidine 20 MG tablet Commonly known as: PEPCID Take 1 tablet (20 mg total) by mouth daily.   montelukast 10 MG tablet Commonly known as: SINGULAIR TAKE 1 TABLET BY MOUTH EVERYDAY AT BEDTIME What changed: See the new instructions.   nitroGLYCERIN 0.4 MG SL tablet Commonly known as: NITROSTAT Place 1 tablet (0.4 mg total) under the tongue every 5 (five) minutes as needed. What changed: reasons to take this    OXYGEN Inhale 3 L into the lungs as needed.   predniSONE 10 MG tablet Commonly known as: DELTASONE Take 40 mg daily for 1 day, 30 mg daily for 1 day, 20 mg daily for 1 days,10 mg daily for 1 day, then stop What changed:   medication strength  how much to take  how to take this  when to take this  additional instructions   Trelegy Ellipta 100-62.5-25 MCG/INH Aepb Generic drug: Fluticasone-Umeclidin-Vilant TAKE 1 PUFF BY MOUTH EVERY DAY What changed: See the new instructions.      Follow-up Information    Osei-Bonsu, Iona Beard, MD. Schedule an appointment as soon as possible for a visit in 2 week(s).   Specialty: Internal Medicine Contact information: [place] [address] [phone]          Allergies  Allergen Reactions  . Tylenol [Acetaminophen] Shortness Of Breath    Consultations:   None  Other Procedures/Studies: Dg Chest 2 View  Result Date: 04/04/2019 CLINICAL DATA:  Productive cough, shortness of breath. EXAM: CHEST - 2 VIEW COMPARISON:  June 06, 2017. FINDINGS: The heart size and mediastinal contours are within normal limits. Both lungs are clear. No pneumothorax or pleural effusion is noted. The visualized skeletal structures are unremarkable. IMPRESSION: No active cardiopulmonary disease. Electronically Signed   By: Marijo Conception M.D.   On: 04/04/2019 16:21   Dg Chest Port 1 View  Result Date: 04/09/2019 CLINICAL DATA:  COVID-19 positive. Shortness of breath. EXAM: PORTABLE CHEST 1 VIEW COMPARISON:  April 07, 2019 FINDINGS: There is patchy airspace opacity in the lung bases with associated mild bibasilar atelectasis. Lungs elsewhere clear. Heart size and pulmonary vascularity are normal. No adenopathy. No bone lesions. IMPRESSION: Bibasilar patchy airspace opacity with associated mild bibasilar atelectasis. Suspect bibasilar pneumonia, likely of atypical organism etiology. Heart size normal.  No adenopathy demonstrable.  Electronically Signed   By: Lowella Grip III M.D.   On: 04/09/2019 17:55   Dg Chest Portable 1 View  Result Date: 04/07/2019 CLINICAL DATA:  Shortness of breath EXAM: PORTABLE CHEST 1 VIEW COMPARISON:  Chest radiograph dated 04/04/2019 FINDINGS: The heart size and mediastinal contours are within normal limits. There is minimal left basilar  atelectasis. The right lung is clear. There is no pleural effusion or pneumothorax. The visualized skeletal structures are unremarkable. IMPRESSION: Minimal left basilar atelectasis. Electronically Signed   By: Zerita Boers M.D.   On: 04/07/2019 13:11     TODAY-DAY OF DISCHARGE:  Subjective:   [name] today has no headache,no chest abdominal pain,no new weakness tingling or numbness, feels much better wants to go home today.   Objective:   Blood pressure 122/83, pulse 66, temperature 98 F (36.7 C), temperature source Oral, resp. rate 18, height 5\' 3"  (1.6 m), weight 78.9 kg, SpO2 95 %.  Intake/Output Summary (Last 24 hours) at 04/14/2019 0942 Last data filed at 04/14/2019 0500 Gross per 24 hour  Intake 500 ml  Output 700 ml  Net -200 ml   Filed Weights   04/09/19 1559 04/10/19 0550  Weight: 79.4 kg 78.9 kg    Exam: Awake Alert, Oriented *3, No new F.N deficits, Normal affect [place].AT,PERRAL Supple Neck,No JVD, No cervical lymphadenopathy appriciated.  Symmetrical Chest wall movement, Good air movement bilaterally, CTAB RRR,No Gallops,Rubs or new Murmurs, No Parasternal Heave +ve B.Sounds, Abd Soft, Non tender, No organomegaly appriciated, No rebound -guarding or rigidity. No Cyanosis, Clubbing or edema, No new Rash or bruise   PERTINENT RADIOLOGIC STUDIES: Dg Chest 2 View  Result Date: 04/04/2019 CLINICAL DATA:  Productive cough, shortness of breath. EXAM: CHEST - 2 VIEW COMPARISON:  June 06, 2017. FINDINGS: The heart size and mediastinal contours are within normal limits. Both lungs are clear. No pneumothorax or pleural  effusion is noted. The visualized skeletal structures are unremarkable. IMPRESSION: No active cardiopulmonary disease. Electronically Signed   By: Marijo Conception M.D.   On: 04/04/2019 16:21   Dg Chest Port 1 View  Result Date: 04/09/2019 CLINICAL DATA:  COVID-19 positive. Shortness of breath. EXAM: PORTABLE CHEST 1 VIEW COMPARISON:  April 07, 2019 FINDINGS: There is patchy airspace opacity in the lung bases with associated mild bibasilar atelectasis. Lungs elsewhere clear. Heart size and pulmonary vascularity are normal. No adenopathy. No bone lesions. IMPRESSION: Bibasilar patchy airspace opacity with associated mild bibasilar atelectasis. Suspect bibasilar pneumonia, likely of atypical organism etiology. Heart size normal.  No adenopathy demonstrable. Electronically Signed   By: Lowella Grip III M.D.   On: 04/09/2019 17:55   Dg Chest Portable 1 View  Result Date: 04/07/2019 CLINICAL DATA:  Shortness of breath EXAM: PORTABLE CHEST 1 VIEW COMPARISON:  Chest radiograph dated 04/04/2019 FINDINGS: The heart size and mediastinal contours are within normal limits. There is minimal left basilar atelectasis. The right lung is clear. There is no pleural effusion or pneumothorax. The visualized skeletal structures are unremarkable. IMPRESSION: Minimal left basilar atelectasis. Electronically Signed   By: Zerita Boers M.D.   On: 04/07/2019 13:11     PERTINENT LAB RESULTS: CBC: Recent Labs    04/13/19 0211 04/14/19 0630  WBC 5.3 7.4  HGB 12.8 13.1  HCT 40.2 40.3  PLT 308 330   CMET CMP     Component Value Date/Time   NA 134 (L) 04/14/2019 0630   NA 141 03/26/2018 1456   K 4.2 04/14/2019 0630   CL 97 (L) 04/14/2019 0630   CO2 29 04/14/2019 0630   GLUCOSE 112 (H) 04/14/2019 0630   BUN 16 04/14/2019 0630   BUN 10 03/26/2018 1456   CREATININE 0.61 04/14/2019 0630   CALCIUM 8.4 (L) 04/14/2019 0630   PROT 6.3 (L) 04/14/2019 0630   PROT 6.7 03/26/2018 1456   ALBUMIN 3.1 (  L)  04/14/2019 0630   ALBUMIN 4.4 03/26/2018 1456   AST 13 (L) 04/14/2019 0630   ALT 14 04/14/2019 0630   ALKPHOS 42 04/14/2019 0630   BILITOT 0.7 04/14/2019 0630   BILITOT <0.2 03/26/2018 1456   GFRNONAA >60 04/14/2019 0630   GFRAA >60 04/14/2019 0630    GFR Estimated Creatinine Clearance: 74.4 mL/min (by C-G formula based on SCr of 0.61 mg/dL). No results for input(s): LIPASE, AMYLASE in the last 72 hours. No results for input(s): CKTOTAL, CKMB, CKMBINDEX, TROPONINI in the last 72 hours. Invalid input(s): [place]    04/13/19 0211 04/14/19 0630  DDIMER <0.27 <0.27   No results for input(s): HGBA1C in the last 72 hours. No results for input(s): CHOL, HDL, LDLCALC, TRIG, CHOLHDL, LDLDIRECT in the last 72 hours. No results for input(s): TSH, T4TOTAL, T3FREE, THYROIDAB in the last 72 hours.  Invalid input(s): FREET3 Recent Labs    04/12/19 0325 04/13/19 0211  FERRITIN 139 93   Coags: No results for input(s): INR in the last 72 hours.  Invalid input(s): PT Microbiology: Recent Results (from the past 240 hour(s))  Novel Coronavirus, NAA ([facility])     Status: Abnormal   Collection Time: 04/05/19 12:00 AM   Specimen: Nasopharyngeal(NP) swabs in vial transport medium   NASOPHARYNGE  TESTING  Result Value Ref Range Status   SARS-CoV-2, NAA Detected (A) Not Detected Final    Comment: This nucleic acid amplification test was developed and its performance characteristics determined by [employer]. Nucleic acid amplification tests include PCR and TMA. This test has not been FDA cleared or approved. This test has been authorized by FDA under an Emergency Use Authorization (EUA). This test is only authorized for the duration of time the declaration that circumstances exist justifying the authorization of the emergency use of in vitro diagnostic tests for detection of SARS-CoV-2 virus and/or diagnosis of COVID-19 infection under section 564(b)(1) of the Act, 21  U.S.C. [id number]) (1), unless the authorization is terminated or revoked sooner. When diagnostic testing is negative, the possibility of a false negative result should be considered in the context of a patient's recent exposures and the presence of clinical signs and symptoms consistent with COVID-19. An individual without symptoms of COVID-19 and who is not shedding SARS-CoV-2 virus would  expect to have a negative (not detected) result in this assay.   Blood Culture (routine x 2)     Status: None (Preliminary result)   Collection Time: 04/09/19  4:56 PM   Specimen: BLOOD  Result Value Ref Range Status   Specimen Description   Final    BLOOD LEFT ANTECUBITAL Performed at [facility] [address], [place], [place] [address]    Special Requests   Final    BOTTLES DRAWN AEROBIC AND ANAEROBIC Blood Culture results may not be optimal due to an excessive volume of blood received in culture bottles Performed at [facility] [address] [place], [place] [address]    Culture   Final    NO GROWTH 4 DAYS Performed at [facility], [place] [address], [place], [place] [address]    Report Status PENDING  Incomplete  Blood Culture (routine x 2)     Status: None (Preliminary result)   Collection Time: 04/09/19  5:11 PM   Specimen: Right Antecubital; Blood  Result Value Ref Range Status   Specimen Description   Final    RIGHT ANTECUBITAL Performed at [facility]  [address], [place], [place] [address]    Special Requests   Final    BOTTLES DRAWN AEROBIC AND ANAEROBIC Blood Culture adequate volume Performed at [facility] [address] [place],  [place] [address]    Culture   Final    NO GROWTH 4 DAYS Performed at [facility], [place] [address], [place], [place] [address]    Report Status PENDING  Incomplete    FURTHER DISCHARGE INSTRUCTIONS:  Get Medicines reviewed and adjusted:  Please take all your medications with you for your next visit with your Primary MD  Laboratory/radiological data: Please request your Primary MD to go over all hospital tests and procedure/radiological results at the follow up, please ask your Primary MD to get all Hospital records sent to his/her office.  In some cases, they will be blood work, cultures and biopsy results pending at the time of your discharge. Please request that your primary care M.D. goes through all the records of your hospital data and follows up on these results.  Also Note the following: If you experience worsening of your admission symptoms, develop shortness of breath, life threatening emergency, suicidal or homicidal thoughts you must seek medical attention immediately by calling 911 or calling your MD immediately  if symptoms less severe.  You must read complete instructions/literature along with all the possible adverse reactions/side effects for all the Medicines you take and that have been prescribed to you. Take any new Medicines after you have completely understood and accpet all the possible adverse reactions/side effects.   Do not drive when taking Pain medications or sleeping medications (Benzodaizepines)  Do not take more than prescribed Pain, Sleep and Anxiety Medications. It is not advisable to combine anxiety,sleep and pain medications without talking with your primary care practitioner  Special Instructions: If you have smoked or chewed Tobacco  in the last 2 yrs please stop smoking, stop any regular Alcohol  and or any Recreational drug use.  Wear Seat belts while driving.  Please note: You were cared for by a hospitalist during your hospital stay. Once you are discharged, your primary care physician will handle any further medical issues. Please note that NO REFILLS for any discharge medications will be authorized once you are discharged, as it is imperative that you return to your primary care  physician (or establish a relationship with a primary care physician if you do not have one) for your post hospital discharge needs so that they can reassess your need for medications and monitor your lab values.  Total Time spent coordinating discharge including counseling, education and face to face time equals35 minutes.  SignedOren Binet 04/14/2019 9:42 AM

## 2019-04-14 NOTE — Discharge Instructions (Signed)
Person Under Monitoring Name: [name]  Location: [address] [place] [address]   Infection Prevention Recommendations for Individuals Confirmed to have, or Being Evaluated for, 2019 Novel Coronavirus (COVID-19) Infection Who Receive Care at Home  Individuals who are confirmed to have, or are being evaluated for, COVID-19 should follow the prevention steps below until a healthcare provider or local or state health department says they can return to normal activities.  Stay home except to get medical care You should restrict activities outside your home, except for getting medical care. Do not go to work, school, or public areas, and do not use public transportation or taxis.  Call ahead before visiting your doctor Before your medical appointment, call the healthcare provider and tell them that you have, or are being evaluated for, COVID-19 infection. This will help the healthcare providers office take steps to keep other people from getting infected. Ask your healthcare provider to call the local or state health department.  Monitor your symptoms Seek prompt medical attention if your illness is worsening (e.g., difficulty breathing). Before going to your medical appointment, call the healthcare provider and tell them that you have, or are being evaluated for, COVID-19 infection. Ask your healthcare provider to call the local or state health department.  Wear a facemask You should wear a facemask that covers your nose and mouth when you are in the same room with other people and when you visit a healthcare provider. People who live with or visit you should also wear a facemask while they are in the same room with you.  Separate yourself from other people in your home As much as possible, you should stay in a different room from other people in your home. Also, you should use a separate bathroom, if available.  Avoid sharing household items You should not  share dishes, drinking glasses, cups, eating utensils, towels, bedding, or other items with other people in your home. After using these items, you should wash them thoroughly with soap and water.  Cover your coughs and sneezes Cover your mouth and nose with a tissue when you cough or sneeze, or you can cough or sneeze into your sleeve. Throw used tissues in a lined trash can, and immediately wash your hands with soap and water for at least 20 seconds or use an alcohol-based hand rub.  Wash your [facility] your hands often and thoroughly with soap and water for at least 20 seconds. You can use an alcohol-based hand sanitizer if soap and water are not available and if your hands are not visibly dirty. Avoid touching your eyes, nose, and mouth with unwashed hands.   Prevention Steps for Caregivers and Household Members of Individuals Confirmed to have, or Being Evaluated for, COVID-19 Infection Being Cared for in the Home  If you live with, or provide care at home for, a person confirmed to have, or being evaluated for, COVID-19 infection please follow these guidelines to prevent infection:  Follow healthcare providers instructions Make sure that you understand and can help the patient follow any healthcare provider instructions for all care.  Provide for the patients basic needs You should help the patient with basic needs in the home and provide support for getting groceries, prescriptions, and other personal needs.  Monitor the patients symptoms If they are getting sicker, call his or her medical provider and tell them that the patient has, or is being evaluated for, COVID-19 infection. This will help the healthcare providers office  take steps to keep other people from getting infected. Ask the healthcare provider to call the local or state health department.  Limit the number of people who have contact with the patient  If possible, have only one caregiver for the  patient.  Other household members should stay in another home or place of residence. If this is not possible, they should stay  in another room, or be separated from the patient as much as possible. Use a separate bathroom, if available.  Restrict visitors who do not have an essential need to be in the home.  Keep older adults, very young children, and other sick people away from the patient Keep older adults, very young children, and those who have compromised immune systems or chronic health conditions away from the patient. This includes people with chronic heart, lung, or kidney conditions, diabetes, and cancer.  Ensure good ventilation Make sure that shared spaces in the home have good air flow, such as from an air conditioner or an opened window, weather permitting.  Wash your hands often  Wash your hands often and thoroughly with soap and water for at least 20 seconds. You can use an alcohol based hand sanitizer if soap and water are not available and if your hands are not visibly dirty.  Avoid touching your eyes, nose, and mouth with unwashed hands.  Use disposable paper towels to dry your hands. If not available, use dedicated cloth towels and replace them when they become wet.  Wear a facemask and gloves  Wear a disposable facemask at all times in the room and gloves when you touch or have contact with the patients blood, body fluids, and/or secretions or excretions, such as sweat, saliva, sputum, nasal mucus, vomit, urine, or feces.  Ensure the mask fits over your nose and mouth tightly, and do not touch it during use.  Throw out disposable facemasks and gloves after using them. Do not reuse.  Wash your hands immediately after removing your facemask and gloves.  If your personal clothing becomes contaminated, carefully remove clothing and launder. Wash your hands after handling contaminated clothing.  Place all used disposable facemasks, gloves, and other waste in a lined  container before disposing them with other household waste.  Remove gloves and wash your hands immediately after handling these items.  Do not share dishes, glasses, or other household items with the patient  Avoid sharing household items. You should not share dishes, drinking glasses, cups, eating utensils, towels, bedding, or other items with a patient who is confirmed to have, or being evaluated for, COVID-19 infection.  After the person uses these items, you should wash them thoroughly with soap and water.  Wash laundry thoroughly  Immediately remove and wash clothes or bedding that have blood, body fluids, and/or secretions or excretions, such as sweat, saliva, sputum, nasal mucus, vomit, urine, or feces, on them.  Wear gloves when handling laundry from the patient.  Read and follow directions on labels of laundry or clothing items and detergent. In general, wash and dry with the warmest temperatures recommended on the label.  Clean all areas the individual has used often  Clean all touchable surfaces, such as counters, tabletops, doorknobs, bathroom fixtures, toilets, phones, keyboards, tablets, and bedside tables, every day. Also, clean any surfaces that may have blood, body fluids, and/or secretions or excretions on them.  Wear gloves when cleaning surfaces the patient has come in contact with.  Use a diluted bleach solution (e.g., dilute bleach with 1 part  bleach and 10 parts water) or a household disinfectant with a label that says EPA-registered for coronaviruses. To make a bleach solution at home, add 1 tablespoon of bleach to 1 quart (4 cups) of water. For a larger supply, add  cup of bleach to 1 gallon (16 cups) of water.  Read labels of cleaning products and follow recommendations provided on product labels. Labels contain instructions for safe and effective use of the cleaning product including precautions you should take when applying the product, such as wearing gloves or  eye protection and making sure you have good ventilation during use of the product.  Remove gloves and wash hands immediately after cleaning.  Monitor yourself for signs and symptoms of illness Caregivers and household members are considered close contacts, should monitor their health, and will be asked to limit movement outside of the home to the extent possible. Follow the monitoring steps for close contacts listed on the symptom monitoring form.   ? If you have additional questions, contact your local health department or call the epidemiologist on call at [phone] (available 24/7). ? This guidance is subject to change. For the most up-to-date guidance from [facility], please refer to their website: [email or web address]

## 2019-04-14 NOTE — Care Management (Signed)
Pt to discharge home today via private vehicle.  Pts son will transport pt home.  Pt has requested her son bring her portable oxygen tank with him at transport.  Pt informed CM that she has home oxygen supplied by [employer] - pt confirms she has working oxygen equipment in the home.  Pt will request follow up PCP appt directly - CM requested that pt informed office of covid status so virtual visit can be arranged.  No other CM needs determined at this time - CM signing off

## 2019-04-14 NOTE — Progress Notes (Signed)
Discharge instructions given to pt. Belongings returned to pt. No further questions or concerns

## 2019-04-15 ENCOUNTER — Ambulatory Visit (INDEPENDENT_AMBULATORY_CARE_PROVIDER_SITE_OTHER): Payer: BC Managed Care – PPO | Admitting: Adult Health

## 2019-04-15 ENCOUNTER — Other Ambulatory Visit: Payer: Self-pay

## 2019-04-15 ENCOUNTER — Encounter: Payer: Self-pay | Admitting: Adult Health

## 2019-04-15 DIAGNOSIS — J449 Chronic obstructive pulmonary disease, unspecified: Secondary | ICD-10-CM

## 2019-04-15 DIAGNOSIS — J1282 Pneumonia due to coronavirus disease 2019: Secondary | ICD-10-CM

## 2019-04-15 DIAGNOSIS — J9611 Chronic respiratory failure with hypoxia: Secondary | ICD-10-CM | POA: Diagnosis not present

## 2019-04-15 DIAGNOSIS — U071 COVID-19: Secondary | ICD-10-CM

## 2019-04-15 DIAGNOSIS — J1289 Other viral pneumonia: Secondary | ICD-10-CM | POA: Diagnosis not present

## 2019-04-15 NOTE — Progress Notes (Signed)
Virtual Visit via Telephone Note  I connected with [name] on 04/15/19 at 10:30 AM EST by telephone and verified that I am speaking with the correct person using two identifiers.  Location: Patient: Home  Provider: Office    I discussed the limitations, risks, security and privacy concerns of performing an evaluation and management service by telephone and the availability of in person appointments. I also discussed with the patient that there may be a patient responsible charge related to this service. The patient expressed understanding and agreed to proceed.   History of Present Illness: 58 year old female former smoker followed for COPD and chronic respiratory failure on oxygen at bedtime Medical history significant for polysubstance abuse with cocaine, hyperlipidemia, coronary disease, GERD  Today's televisit is a post hospital follow-up for Covid 19 .  Patient was recently hospitalized for COVID-19 from November 24 through April 14, 2019.  Patient presented with acute symptoms of cough shortness of breath and acute on chronic respiratory failure.  She was treated with IV steroids and remdesivir.  She received her last dose of remdesivir on November 28.  She was discharged on steroid taper.  Patient says since returning home she continues to feel weak.  Shortness of breath is slightly improved however she gets winded with minimal activity.  Has a decreased cough.  Chest x-ray showed bibasilar patchy airspace opacity.  Patient denies any hemoptysis, chest pain, orthopnea, edema.  Appetite is decreased but she is eating with no nausea vomiting or diarrhea.  Patient did not lose her sense of smell or taste. She remains on her oxygen at 2 L.  Says that she does not have a pulse oximeter at home.  She says she has been told she can go back to work April 21, 2019.  She has a follow-up with primary care for lab work soon.   Observations/Objective: CT Chest 09/05/17:Lung-RADS 2, benign  appearance or behavior. Continue annual screening with low-dose chest CT without contrast in 12 months. Emphysema. Aortic Atherosclerois.   Assessment and Plan: COVID-19 viral pneumonia-clinically slowly improving.  She has received remdesivir and steroids.  She is to finish her steroid taper as directed.  Continue to quarantine at home for the directed timeframe.  She will need a follow-up chest x-ray in 4 to 6 weeks.  If bilateral opacities persist would consider a high-resolution CT chest and follow-up full PFTs with DLCO.  Acute on chronic hypoxic respiratory failure-improved after after hospitalization for treatment of COVID-19 with remdesivir and steroids.  Patient is back to her baseline at 2 L of oxygen.  COPD-flare with recent COVID-19 infection appears to be returning back to baseline.  Would continue on Trelegy.  Finish prednisone taper.  Pulmonary hygiene as directed  Plan  Patient Instructions  Mucinex DM twice daily as needed for cough and congestion Finish Prednisone taper as directed.  Advance Activity as tolerated.  Continue on Trelegy 1 puff daily, rinse after use Please self quarantine at home as directed through 04/21/19 .  Fluids and rest Continue on oxygen 2 L at bedtime May use albuterol inhaler or nebulizer as needed for wheezing shortness of breath If symptoms worsen or do not improve please contact our office immediately Follow up with Dr. [name] in 4-6 weeks and As needed  With chest xray .  Follow up with Primary MD as directed.  Please contact office for sooner follow up if symptoms do not improve or worsen or seek emergency care        Follow  Up Instructions:   follow up in 4-6 weeks and As needed   Please contact office for sooner follow up if symptoms do not improve or worsen or seek emergency care   I discussed the assessment and treatment plan with the patient. The patient was provided an opportunity to ask questions and all were answered. The  patient agreed with the plan and demonstrated an understanding of the instructions.   The patient was advised to call back or seek an in-person evaluation if the symptoms worsen or if the condition fails to improve as anticipated.  I provided 22  minutes of non-face-to-face time during this encounter.   Rexene Edison, NP

## 2019-04-15 NOTE — Patient Instructions (Addendum)
Mucinex DM twice daily as needed for cough and congestion Finish Prednisone taper as directed.  Advance Activity as tolerated.  Continue on Trelegy 1 puff daily, rinse after use Please self quarantine at home as directed through 04/21/19 .  Fluids and rest Continue on oxygen 2 L at bedtime May use albuterol inhaler or nebulizer as needed for wheezing shortness of breath If symptoms worsen or do not improve please contact our office immediately Follow up with Dr. [name] in 4-6 weeks and As needed  With chest xray .  Follow up with Primary MD as directed.  Please contact office for sooner follow up if symptoms do not improve or worsen or seek emergency care

## 2019-04-21 DIAGNOSIS — J449 Chronic obstructive pulmonary disease, unspecified: Secondary | ICD-10-CM | POA: Diagnosis not present

## 2019-04-22 ENCOUNTER — Other Ambulatory Visit: Payer: Self-pay | Admitting: Cardiology

## 2019-04-22 MED ORDER — NITROGLYCERIN 0.4 MG SL SUBL: 0.4000 mg | SUBLINGUAL_TABLET | SUBLINGUAL | 5 refills | Status: DC | PRN

## 2019-04-22 NOTE — Addendum Note (Signed)
Addended by: Polly Cobia A on: 04/22/2019 02:15 PM   Modules accepted: Orders

## 2019-04-22 NOTE — Telephone Encounter (Signed)
Ntg refill sent to [facility]  [address]

## 2019-04-22 NOTE — Telephone Encounter (Signed)
°*  STAT* If patient is at the pharmacy, call can be transferred to refill team.   1. Which medications need to be refilled? (please list name of each medication and dose if known) Nitroglycerin 2. Which pharmacy/location (including street and city if local pharmacy) is medication to be sent to?[facility] [address]  3. Do they need a 30 day or 90 day supply? [place]

## 2019-04-25 DIAGNOSIS — E559 Vitamin D deficiency, unspecified: Secondary | ICD-10-CM | POA: Diagnosis not present

## 2019-04-25 DIAGNOSIS — J449 Chronic obstructive pulmonary disease, unspecified: Secondary | ICD-10-CM | POA: Diagnosis not present

## 2019-04-25 DIAGNOSIS — E785 Hyperlipidemia, unspecified: Secondary | ICD-10-CM | POA: Diagnosis not present

## 2019-04-25 DIAGNOSIS — R7303 Prediabetes: Secondary | ICD-10-CM | POA: Diagnosis not present

## 2019-04-25 DIAGNOSIS — Z87891 Personal history of nicotine dependence: Secondary | ICD-10-CM | POA: Diagnosis not present

## 2019-04-29 DIAGNOSIS — Z0001 Encounter for general adult medical examination with abnormal findings: Secondary | ICD-10-CM | POA: Diagnosis not present

## 2019-04-29 DIAGNOSIS — Z2821 Immunization not carried out because of patient refusal: Secondary | ICD-10-CM | POA: Diagnosis not present

## 2019-04-29 DIAGNOSIS — E782 Mixed hyperlipidemia: Secondary | ICD-10-CM | POA: Diagnosis not present

## 2019-04-29 DIAGNOSIS — R7303 Prediabetes: Secondary | ICD-10-CM | POA: Diagnosis not present

## 2019-04-29 DIAGNOSIS — J449 Chronic obstructive pulmonary disease, unspecified: Secondary | ICD-10-CM | POA: Diagnosis not present

## 2019-05-09 DIAGNOSIS — G720 Drug-induced myopathy: Secondary | ICD-10-CM | POA: Insufficient documentation

## 2019-05-09 DIAGNOSIS — T466X5A Adverse effect of antihyperlipidemic and antiarteriosclerotic drugs, initial encounter: Secondary | ICD-10-CM

## 2019-05-09 HISTORY — DX: Adverse effect of antihyperlipidemic and antiarteriosclerotic drugs, initial encounter: T46.6X5A

## 2019-05-09 HISTORY — DX: Drug-induced myopathy: G72.0

## 2019-05-20 ENCOUNTER — Other Ambulatory Visit: Payer: Self-pay

## 2019-05-22 DIAGNOSIS — J449 Chronic obstructive pulmonary disease, unspecified: Secondary | ICD-10-CM | POA: Diagnosis not present

## 2019-05-24 DIAGNOSIS — M76819 Anterior tibial syndrome, unspecified leg: Secondary | ICD-10-CM | POA: Diagnosis not present

## 2019-05-24 DIAGNOSIS — R0902 Hypoxemia: Secondary | ICD-10-CM | POA: Diagnosis not present

## 2019-05-24 DIAGNOSIS — R0682 Tachypnea, not elsewhere classified: Secondary | ICD-10-CM | POA: Diagnosis not present

## 2019-05-25 ENCOUNTER — Emergency Department (HOSPITAL_COMMUNITY)
Admission: EM | Admit: 2019-05-25 | Discharge: 2019-05-25 | Discharge status: Against Medical Advice | Payer: BC Managed Care – PPO | Attending: Emergency Medicine | Admitting: Emergency Medicine

## 2019-05-25 ENCOUNTER — Encounter (HOSPITAL_COMMUNITY): Payer: Self-pay | Admitting: Emergency Medicine

## 2019-05-25 DIAGNOSIS — M7989 Other specified soft tissue disorders: Secondary | ICD-10-CM | POA: Diagnosis not present

## 2019-05-25 DIAGNOSIS — M25471 Effusion, right ankle: Secondary | ICD-10-CM | POA: Diagnosis not present

## 2019-05-25 DIAGNOSIS — M25571 Pain in right ankle and joints of right foot: Secondary | ICD-10-CM | POA: Diagnosis not present

## 2019-05-25 DIAGNOSIS — J449 Chronic obstructive pulmonary disease, unspecified: Secondary | ICD-10-CM | POA: Diagnosis not present

## 2019-05-25 DIAGNOSIS — Z87891 Personal history of nicotine dependence: Secondary | ICD-10-CM | POA: Diagnosis not present

## 2019-05-25 DIAGNOSIS — I2510 Atherosclerotic heart disease of native coronary artery without angina pectoris: Secondary | ICD-10-CM | POA: Insufficient documentation

## 2019-05-25 DIAGNOSIS — Z7901 Long term (current) use of anticoagulants: Secondary | ICD-10-CM | POA: Insufficient documentation

## 2019-05-25 DIAGNOSIS — Z79899 Other long term (current) drug therapy: Secondary | ICD-10-CM | POA: Diagnosis not present

## 2019-05-25 LAB — CBG MONITORING, ED: Glucose-Capillary: 92 mg/dL (ref 70–99)

## 2019-05-25 LAB — CBC WITH DIFFERENTIAL/PLATELET
Abs Immature Granulocytes: 0.02 10*3/uL (ref 0.00–0.07)
Basophils Absolute: 0.1 10*3/uL (ref 0.0–0.1)
Basophils Relative: 1 %
Eosinophils Absolute: 0.2 10*3/uL (ref 0.0–0.5)
Eosinophils Relative: 2 %
HCT: 36.9 % (ref 36.0–46.0)
Hemoglobin: 11.5 g/dL — ABNORMAL LOW (ref 12.0–15.0)
Immature Granulocytes: 0 %
Lymphocytes Relative: 22 %
Lymphs Abs: 1.5 10*3/uL (ref 0.7–4.0)
MCH: 29.1 pg (ref 26.0–34.0)
MCHC: 31.2 g/dL (ref 30.0–36.0)
MCV: 93.4 fL (ref 80.0–100.0)
Monocytes Absolute: 0.5 10*3/uL (ref 0.1–1.0)
Monocytes Relative: 8 %
Neutro Abs: 4.5 10*3/uL (ref 1.7–7.7)
Neutrophils Relative %: 67 %
Platelets: 272 10*3/uL (ref 150–400)
RBC: 3.95 MIL/uL (ref 3.87–5.11)
RDW: 15.3 % (ref 11.5–15.5)
WBC: 6.7 10*3/uL (ref 4.0–10.5)
nRBC: 0 % (ref 0.0–0.2)

## 2019-05-25 LAB — D-DIMER, QUANTITATIVE: D-Dimer, Quant: <0.27 ug/mL-FEU (ref 0.00–0.50)

## 2019-05-25 LAB — BASIC METABOLIC PANEL
Anion gap: 7 (ref 5–15)
BUN: 8 mg/dL (ref 6–20)
CO2: 28 mmol/L (ref 22–32)
Calcium: 8.8 mg/dL — ABNORMAL LOW (ref 8.9–10.3)
Chloride: 105 mmol/L (ref 98–111)
Creatinine, Ser: 0.9 mg/dL (ref 0.44–1.00)
GFR calc Af Amer: >60 mL/min (ref >60)
GFR calc non Af Amer: >60 mL/min (ref >60)
Glucose, Bld: 102 mg/dL — ABNORMAL HIGH (ref 70–99)
Potassium: 3.5 mmol/L (ref 3.5–5.1)
Sodium: 140 mmol/L (ref 135–145)

## 2019-05-25 MED ORDER — ALBUTEROL SULFATE HFA 108 (90 BASE) MCG/ACT IN AERS
4.0000 | INHALATION_SPRAY | Freq: Once | RESPIRATORY_TRACT | Status: DC
  Filled 2019-05-25: qty 6.7

## 2019-05-25 NOTE — ED Notes (Signed)
The pa also was not able to find her an hour before is saw the empty room  She does not know what happened to her either

## 2019-05-25 NOTE — ED Notes (Signed)
Pt has an order for a cam walker  When I finished with another pt the pt was gone  Unknown if she received the cam walker no one saw her leave the room  Found the wheelchair and wrinkled sheet

## 2019-05-25 NOTE — ED Triage Notes (Addendum)
C/o R ankle pain and swelling x 1 week.  Denies known injury. Denies SOB.  Seen at [facility] and sent to ED for further eval.

## 2019-05-25 NOTE — Discharge Instructions (Addendum)
Wear cam walker boot as directed.  Follow the RICE (Rest, Ice, Compression, Elevation) protocol as directed.   Follow-up with referred orthopedic doctor.  Return emergency department for any worsening pain, redness or swelling that begins to spread, fevers, numbness/weakness or any other worsening or concerning symptoms.
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
## 2019-05-25 NOTE — ED Notes (Signed)
The pt is c/o a swollen rt ankle for approx one week  No lnown injury  She was seen at an urgent care on [address] had an xray and was told to follow up here  The  Foot is not getting any better.  Strong pedal pulse  Foot color normal

## 2019-05-30 ENCOUNTER — Encounter (HOSPITAL_COMMUNITY): Payer: Self-pay

## 2019-05-30 ENCOUNTER — Ambulatory Visit (HOSPITAL_COMMUNITY): Admit: 2019-05-30 | Payer: BC Managed Care – PPO | Admitting: Gastroenterology

## 2019-05-30 SURGERY — COLONOSCOPY WITH PROPOFOL
Anesthesia: Monitor Anesthesia Care

## 2019-05-31 DIAGNOSIS — E559 Vitamin D deficiency, unspecified: Secondary | ICD-10-CM | POA: Diagnosis not present

## 2019-05-31 DIAGNOSIS — R7303 Prediabetes: Secondary | ICD-10-CM | POA: Diagnosis not present

## 2019-05-31 DIAGNOSIS — M25571 Pain in right ankle and joints of right foot: Secondary | ICD-10-CM | POA: Diagnosis not present

## 2019-05-31 DIAGNOSIS — J449 Chronic obstructive pulmonary disease, unspecified: Secondary | ICD-10-CM | POA: Diagnosis not present

## 2019-05-31 DIAGNOSIS — E782 Mixed hyperlipidemia: Secondary | ICD-10-CM | POA: Diagnosis not present

## 2019-06-06 DIAGNOSIS — M76821 Posterior tibial tendinitis, right leg: Secondary | ICD-10-CM | POA: Diagnosis not present

## 2019-06-13 DIAGNOSIS — M25571 Pain in right ankle and joints of right foot: Secondary | ICD-10-CM | POA: Diagnosis not present

## 2019-06-17 DIAGNOSIS — M76821 Posterior tibial tendinitis, right leg: Secondary | ICD-10-CM | POA: Diagnosis not present

## 2019-06-22 DIAGNOSIS — J449 Chronic obstructive pulmonary disease, unspecified: Secondary | ICD-10-CM | POA: Diagnosis not present

## 2019-06-27 ENCOUNTER — Other Ambulatory Visit: Payer: Self-pay | Admitting: Pulmonary Disease

## 2019-07-06 ENCOUNTER — Other Ambulatory Visit: Payer: Self-pay | Admitting: Cardiology

## 2019-07-08 ENCOUNTER — Other Ambulatory Visit: Payer: Self-pay | Admitting: Cardiology

## 2019-07-08 MED ORDER — FAMOTIDINE 20 MG PO TABS: 20.0000 mg | ORAL_TABLET | Freq: Every day | ORAL | 1 refills | Status: DC

## 2019-07-08 NOTE — Telephone Encounter (Signed)
Rx sent to pharmacy on file per patient request.

## 2019-07-08 NOTE — Telephone Encounter (Signed)
*  STAT* If patient is at the pharmacy, call can be transferred to refill team.   1. Which medications need to be refilled? (please list name of each medication and dose if known) famotidine (PEPCID) 20 MG tablet  2. Which pharmacy/location (including street and city if local pharmacy) is medication to be sent to? [id number] - [place], [place] [address].  3. Do they need a 30 day or 90 day supply? [place]

## 2019-07-20 DIAGNOSIS — J449 Chronic obstructive pulmonary disease, unspecified: Secondary | ICD-10-CM | POA: Diagnosis not present

## 2019-07-27 ENCOUNTER — Other Ambulatory Visit: Payer: Self-pay | Admitting: Cardiology

## 2019-07-27 DIAGNOSIS — I2510 Atherosclerotic heart disease of native coronary artery without angina pectoris: Secondary | ICD-10-CM

## 2019-08-01 DIAGNOSIS — R7303 Prediabetes: Secondary | ICD-10-CM | POA: Diagnosis not present

## 2019-08-01 DIAGNOSIS — J449 Chronic obstructive pulmonary disease, unspecified: Secondary | ICD-10-CM | POA: Diagnosis not present

## 2019-08-01 DIAGNOSIS — E782 Mixed hyperlipidemia: Secondary | ICD-10-CM | POA: Diagnosis not present

## 2019-08-01 DIAGNOSIS — E559 Vitamin D deficiency, unspecified: Secondary | ICD-10-CM | POA: Diagnosis not present

## 2019-08-01 DIAGNOSIS — Z87891 Personal history of nicotine dependence: Secondary | ICD-10-CM | POA: Diagnosis not present

## 2019-08-20 DIAGNOSIS — J449 Chronic obstructive pulmonary disease, unspecified: Secondary | ICD-10-CM | POA: Diagnosis not present

## 2019-09-03 ENCOUNTER — Other Ambulatory Visit: Payer: Self-pay

## 2019-09-03 ENCOUNTER — Emergency Department (HOSPITAL_COMMUNITY)
Admission: EM | Admit: 2019-09-03 | Discharge: 2019-09-03 | Disposition: A | Discharge status: Home / Self Care | Payer: BC Managed Care – PPO | Attending: Emergency Medicine | Admitting: Emergency Medicine

## 2019-09-03 ENCOUNTER — Encounter (HOSPITAL_COMMUNITY): Payer: Self-pay

## 2019-09-03 ENCOUNTER — Emergency Department (HOSPITAL_COMMUNITY): Payer: BC Managed Care – PPO

## 2019-09-03 DIAGNOSIS — Y929 Unspecified place or not applicable: Secondary | ICD-10-CM | POA: Insufficient documentation

## 2019-09-03 DIAGNOSIS — J449 Chronic obstructive pulmonary disease, unspecified: Secondary | ICD-10-CM | POA: Diagnosis not present

## 2019-09-03 DIAGNOSIS — Y939 Activity, unspecified: Secondary | ICD-10-CM | POA: Diagnosis not present

## 2019-09-03 DIAGNOSIS — W109XXA Fall (on) (from) unspecified stairs and steps, initial encounter: Secondary | ICD-10-CM | POA: Insufficient documentation

## 2019-09-03 DIAGNOSIS — I2510 Atherosclerotic heart disease of native coronary artery without angina pectoris: Secondary | ICD-10-CM | POA: Diagnosis not present

## 2019-09-03 DIAGNOSIS — Z79899 Other long term (current) drug therapy: Secondary | ICD-10-CM | POA: Insufficient documentation

## 2019-09-03 DIAGNOSIS — S93602A Unspecified sprain of left foot, initial encounter: Secondary | ICD-10-CM | POA: Diagnosis not present

## 2019-09-03 DIAGNOSIS — Z87891 Personal history of nicotine dependence: Secondary | ICD-10-CM | POA: Diagnosis not present

## 2019-09-03 DIAGNOSIS — Y999 Unspecified external cause status: Secondary | ICD-10-CM | POA: Diagnosis not present

## 2019-09-03 DIAGNOSIS — S99922A Unspecified injury of left foot, initial encounter: Secondary | ICD-10-CM | POA: Diagnosis present

## 2019-09-03 NOTE — Discharge Instructions (Signed)
Take Motrin as needed as directed for foot pain. Elevate foot and apply ice for 20 minutes at a time.  Follow up with your doctor if not improving in 1 week.
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
## 2019-09-03 NOTE — ED Triage Notes (Signed)
Patient states she was going down and was on the last step and fell. Patient c/o left foot pain. Patient states she cannot bear weight on her left foot.  Patient denies hitting her head or having LOC.

## 2019-09-04 DIAGNOSIS — Z0000 Encounter for general adult medical examination without abnormal findings: Secondary | ICD-10-CM | POA: Diagnosis not present

## 2019-09-04 DIAGNOSIS — Z136 Encounter for screening for cardiovascular disorders: Secondary | ICD-10-CM | POA: Diagnosis not present

## 2019-09-04 DIAGNOSIS — Z0100 Encounter for examination of eyes and vision without abnormal findings: Secondary | ICD-10-CM | POA: Diagnosis not present

## 2019-09-04 DIAGNOSIS — Z0110 Encounter for examination of ears and hearing without abnormal findings: Secondary | ICD-10-CM | POA: Diagnosis not present

## 2019-09-04 DIAGNOSIS — R918 Other nonspecific abnormal finding of lung field: Secondary | ICD-10-CM | POA: Diagnosis not present

## 2019-09-04 DIAGNOSIS — Z131 Encounter for screening for diabetes mellitus: Secondary | ICD-10-CM | POA: Diagnosis not present

## 2019-09-13 ENCOUNTER — Other Ambulatory Visit: Payer: Self-pay | Admitting: Cardiology

## 2019-09-18 DIAGNOSIS — N8110 Cystocele, unspecified: Secondary | ICD-10-CM

## 2019-09-18 HISTORY — DX: Cystocele, unspecified: N81.10

## 2019-10-07 ENCOUNTER — Other Ambulatory Visit: Payer: Self-pay | Admitting: Gastroenterology

## 2019-10-21 ENCOUNTER — Ambulatory Visit (INDEPENDENT_AMBULATORY_CARE_PROVIDER_SITE_OTHER): Payer: BC Managed Care – PPO | Admitting: Pulmonary Disease

## 2019-10-21 ENCOUNTER — Telehealth: Payer: Self-pay | Admitting: Pulmonary Disease

## 2019-10-21 ENCOUNTER — Other Ambulatory Visit: Payer: Self-pay

## 2019-10-21 ENCOUNTER — Ambulatory Visit (INDEPENDENT_AMBULATORY_CARE_PROVIDER_SITE_OTHER): Payer: BC Managed Care – PPO

## 2019-10-21 ENCOUNTER — Encounter: Payer: Self-pay | Admitting: Pulmonary Disease

## 2019-10-21 VITALS — BP 124/78 | HR 86 | Temp 99.2°F | Ht 63.0 in | Wt 167.6 lb

## 2019-10-21 DIAGNOSIS — J441 Chronic obstructive pulmonary disease with (acute) exacerbation: Secondary | ICD-10-CM | POA: Diagnosis not present

## 2019-10-21 DIAGNOSIS — Z79899 Other long term (current) drug therapy: Secondary | ICD-10-CM

## 2019-10-21 DIAGNOSIS — J9611 Chronic respiratory failure with hypoxia: Secondary | ICD-10-CM

## 2019-10-21 DIAGNOSIS — R0600 Dyspnea, unspecified: Secondary | ICD-10-CM

## 2019-10-21 DIAGNOSIS — Z8616 Personal history of COVID-19: Secondary | ICD-10-CM

## 2019-10-21 DIAGNOSIS — R0609 Other forms of dyspnea: Secondary | ICD-10-CM

## 2019-10-21 HISTORY — DX: Other long term (current) drug therapy: Z79.899

## 2019-10-21 HISTORY — DX: Personal history of COVID-19: Z86.16

## 2019-10-21 MED ORDER — AZITHROMYCIN 250 MG PO TABS: ORAL_TABLET | ORAL | 0 refills | Status: DC

## 2019-10-21 MED ORDER — PREDNISONE 10 MG PO TABS: ORAL_TABLET | ORAL | 0 refills | Status: AC

## 2019-10-21 MED ORDER — BREZTRI AEROSPHERE 160-9-4.8 MCG/ACT IN AERO: 2.0000 | INHALATION_SPRAY | Freq: Two times a day (BID) | RESPIRATORY_TRACT | 0 refills | Status: DC

## 2019-10-21 MED ORDER — METHYLPREDNISOLONE ACETATE 80 MG/ML IJ SUSP
80.0000 mg | Freq: Once | INTRAMUSCULAR | Status: AC
  Administered 2019-10-21: 80 mg via INTRAMUSCULAR

## 2019-10-21 NOTE — Patient Instructions (Addendum)
You were seen today by Lauraine Rinne, NP  for:   I am glad that you contact your office to be seen.  I believe your symptoms are related to a COPD exacerbation.  Likely related to the fact that you have been off of your inhalers for some time.  We will work to remedy this.  Your chest x-ray today was clear.  We will give you a Depo-Medrol injection today.  You will start taking a prednisone taper tomorrow.  Start Z-Pak tomorrow.  Also start new inhaler Breztri tomorrow.   We will see you back in our office in 4 weeks or sooner if you have worsened symptoms.  Take care and stay safe, next Yarixa Lightcap  1. Dyspnea on exertion  - predniSONE (DELTASONE) 10 MG tablet; Take 4 tablets (40 mg total) by mouth daily with breakfast for 3 days, THEN 3 tablets (30 mg total) daily with breakfast for 3 days, THEN 2 tablets (20 mg total) daily with breakfast for 3 days, THEN 1 tablet (10 mg total) daily with breakfast for 3 days.  Dispense: 30 tablet; Refill: 0  2. Medication management  Start Breztri >>> 2 puffs in the morning right when you wake up, rinse out your mouth after use, 12 hours later 2 puffs, rinse after use >>> Take this daily, no matter what >>> This is not a rescue inhaler    3. Chronic obstructive pulmonary disease with acute exacerbation (HCC)  - predniSONE (DELTASONE) 10 MG tablet; Take 4 tablets (40 mg total) by mouth daily with breakfast for 3 days, THEN 3 tablets (30 mg total) daily with breakfast for 3 days, THEN 2 tablets (20 mg total) daily with breakfast for 3 days, THEN 1 tablet (10 mg total) daily with breakfast for 3 days.  Dispense: 30 tablet; Refill: 0 - azithromycin (ZITHROMAX) 250 MG tablet; 500mg  (two tablets) today, then 250mg  (1 tablet) for the next 4 days  Dispense: 6 tablet; Refill: 0  Depo 80 today   Prednisone 10mg  tablet  >>>4 tabs for 3 days, then 3 tabs for 3 days, 2 tabs for 3 days, then 1 tab for 3 days, then stop >>>take with food  >>>take in the morning    Azithromycin 250mg  tablet  >>>Take 2 tablets (500mg  total) today, and then 1 tablet (250mg ) for the next four days  >>>take with food  >>>can also take probiotic and / or yogurt while on antibiotic   Breztri >>> 2 puffs in the morning right when you wake up, rinse out your mouth after use, 12 hours later 2 puffs, rinse after use >>> Take this daily, no matter what >>> This is not a rescue inhaler   Note your daily symptoms > remember "red flags" for COPD:   >>>Increase in cough >>>increase in sputum production >>>increase in shortness of breath or activity  intolerance.   If you notice these symptoms, please call the office to be seen.     Continue oxygen therapy as prescribed  >>>maintain oxygen saturations greater than 88 percent  >>>if unable to maintain oxygen saturations please contact the office  >>>do not smoke with oxygen  >>>can use nasal saline gel or nasal saline rinses to moisturize nose if oxygen causes dryness    Meds ordered this encounter  Medications   predniSONE (DELTASONE) 10 MG tablet    Sig: Take 4 tablets (40 mg total) by mouth daily with breakfast for 3 days, THEN 3 tablets (30 mg total) daily with breakfast for 3  days, THEN 2 tablets (20 mg total) daily with breakfast for 3 days, THEN 1 tablet (10 mg total) daily with breakfast for 3 days.    Dispense:  30 tablet    Refill:  0   azithromycin (ZITHROMAX) 250 MG tablet    Sig: 500mg  (two tablets) today, then 250mg  (1 tablet) for the next 4 days    Dispense:  6 tablet    Refill:  0    Follow Up:    Return in about 4 weeks (around 11/18/2019), or if symptoms worsen or fail to improve, for Follow up with Wyn Quaker FNP-C, Follow up with Tammy Parrett  ANP-BC, Follow up with Dr. [name].   Please do your part to reduce the spread of COVID-19:      Reduce your risk of any infection  and COVID19 by using the similar precautions used for avoiding the common cold or flu:   Wash your hands often  with soap and warm water for at least 20 seconds.  If soap and water are not readily available, use an alcohol-based hand sanitizer with at least 60% alcohol.   If coughing or sneezing, cover your mouth and nose by coughing or sneezing into the elbow areas of your shirt or coat, into a tissue or into your sleeve (not your hands).  WEAR A MASK when in public   Avoid shaking hands with others and consider head nods or verbal greetings only.  Avoid touching your eyes, nose, or mouth with unwashed hands.   Avoid close contact with people who are sick.  Avoid places or events with large numbers of people in one location, like concerts or sporting events.  If you have some symptoms but not all symptoms, continue to monitor at home and seek medical attention if your symptoms worsen.  If you are having a medical emergency, call 911.   [place] / e-Visit: [email or web address]         [facility]: [facility]: [phone]                   [facility]: [phone]     It is flu season:   >>> Best ways to protect herself from the flu: Receive the yearly flu vaccine, practice good hand hygiene washing with soap and also using hand sanitizer when available, eat a nutritious meals, get adequate rest, hydrate appropriately   Please contact the office if your symptoms worsen or you have concerns that you are not improving.   Thank you for choosing [facility] for your healthcare, and for allowing [place] to partner with you on your healthcare journey. I am thankful to be able to provide care to you today.   Wyn Quaker FNP-C

## 2019-10-21 NOTE — Assessment & Plan Note (Signed)
Likely COPD exacerbation today Patient ran out of her inhalers sometime within the last week, she is unable to explain to me why Inspiratory next Tory wheezes on exam today Chest x-ray clear  Plan: Depo-Medrol 80 today Z-Pak today Start prednisone taper tomorrow Trial of Breztri inhaler 4-week follow-up with Dr. [name] or TP NP or BM NP

## 2019-10-21 NOTE — Progress Notes (Signed)
Discussed results with patient in office.  Nothing further is needed at this time.  Joshwa Hemric FNP  

## 2019-10-21 NOTE — Assessment & Plan Note (Signed)
Plan: Continue nighttime oxygen 4-week follow-up

## 2019-10-21 NOTE — Telephone Encounter (Signed)
Pt returning a phone call. Pt can be reached at [phone].

## 2019-10-21 NOTE — Assessment & Plan Note (Signed)
Plan: We will continue to clinically monitor 

## 2019-10-21 NOTE — Telephone Encounter (Signed)
Called and spoke with patient she is having shortness of breath and a productive cough with yellow sputum. She states her symptoms started last night. She is not getting any relief from her rescue inhaler or nebs. Per Aaron Edelman order for CXR has been placed and she will see him at 4pm today. Nothing further needed at this time.

## 2019-10-21 NOTE — Telephone Encounter (Signed)
LMTCB x1 for pt. We have not seen the pt in over a year.

## 2019-10-21 NOTE — Assessment & Plan Note (Signed)
Dyspnea on exertion today is likely directly related to a COPD exacerbation as well as nonadherence with her inhalers Inspiratory and expiratory wheezes on exam today  Plan: Depo 80 today Start prednisone taper tomorrow Z-Pak Trial of Breztri inhaler  4-week follow-up to ensure breathing improvement
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
## 2019-10-21 NOTE — Assessment & Plan Note (Signed)
Trial of Breztri

## 2019-11-04 ENCOUNTER — Other Ambulatory Visit: Payer: Self-pay | Admitting: Cardiology

## 2019-11-04 ENCOUNTER — Telehealth: Payer: Self-pay | Admitting: Pulmonary Disease

## 2019-11-04 ENCOUNTER — Other Ambulatory Visit (HOSPITAL_COMMUNITY)
Admission: RE | Admit: 2019-11-04 | Discharge: 2019-11-04 | Disposition: A | Discharge status: Home / Self Care | Payer: BC Managed Care – PPO | Source: Ambulatory Visit | Attending: Gastroenterology | Admitting: Gastroenterology

## 2019-11-04 DIAGNOSIS — Z01812 Encounter for preprocedural laboratory examination: Secondary | ICD-10-CM | POA: Diagnosis not present

## 2019-11-04 DIAGNOSIS — Z20822 Contact with and (suspected) exposure to covid-19: Secondary | ICD-10-CM | POA: Insufficient documentation

## 2019-11-04 LAB — SARS CORONAVIRUS 2 (TAT 6-24 HRS): SARS Coronavirus 2: NEGATIVE

## 2019-11-04 NOTE — Telephone Encounter (Signed)
Spoke with pt. She is wanting to know if she can have a handicap placard.  Dr. [name] - please advise. Thanks.

## 2019-11-06 NOTE — Telephone Encounter (Signed)
Pt aware that form will be filled out and left for Dr. [name] to sign.

## 2019-11-06 NOTE — Anesthesia Preprocedure Evaluation (Addendum)
Anesthesia Evaluation    Reviewed: Allergy & Precautions, Patient's Chart, lab work & pertinent test results  History of Anesthesia Complications Negative for: history of anesthetic complications  Airway Mallampati: II  TM Distance: >3 FB Neck ROM: Full    Dental  (+) Edentulous Upper, Dental Advisory Given   Pulmonary COPD,  COPD inhaler, former smoker,   PRN O2 at night    Pulmonary exam normal        Cardiovascular + CAD  Normal cardiovascular exam     Neuro/Psych  Headaches, negative psych ROS   GI/Hepatic hiatal hernia, GERD  Medicated and Controlled,(+)     substance abuse  cocaine use, Hepatitis -, C  Endo/Other   Obesity   Renal/GU negative Renal ROS     Musculoskeletal negative musculoskeletal ROS (+)   Abdominal   Peds  Hematology  On plavix    Anesthesia Other Findings Covid test negative   Reproductive/Obstetrics                            Anesthesia Physical Anesthesia Plan  ASA: III  Anesthesia Plan: MAC   Post-op Pain Management:    Induction: Intravenous  PONV Risk Score and Plan: 2 and Propofol infusion and Treatment may vary due to age or medical condition  Airway Management Planned: Natural Airway and Simple Face Mask  Additional Equipment: None  Intra-op Plan:   Post-operative Plan:   Informed Consent: I have reviewed the patients History and Physical, chart, labs and discussed the procedure including the risks, benefits and alternatives for the proposed anesthesia with the patient or authorized representative who has indicated his/her understanding and acceptance.       Plan Discussed with: CRNA and Anesthesiologist  Anesthesia Plan Comments:        Anesthesia Quick Evaluation

## 2019-11-06 NOTE — Telephone Encounter (Signed)
Ok to give application for [employer]

## 2019-11-06 NOTE — Telephone Encounter (Signed)
I will fill out application and place in Dr. [name] to sign folder. I called pt to make her aware but she didn't answer. LM for her to call back.

## 2019-11-07 ENCOUNTER — Ambulatory Visit (HOSPITAL_COMMUNITY): Payer: BC Managed Care – PPO | Admitting: Anesthesiology

## 2019-11-07 ENCOUNTER — Ambulatory Visit (HOSPITAL_COMMUNITY)
Admission: RE | Admit: 2019-11-07 | Discharge: 2019-11-07 | Disposition: A | Discharge status: Home / Self Care | Payer: BC Managed Care – PPO | Attending: Gastroenterology | Admitting: Gastroenterology

## 2019-11-07 ENCOUNTER — Encounter (HOSPITAL_COMMUNITY)
Admission: RE | Disposition: A | Discharge status: Home / Self Care | Payer: Self-pay | Source: Home / Self Care | Attending: Gastroenterology

## 2019-11-07 ENCOUNTER — Other Ambulatory Visit: Payer: Self-pay

## 2019-11-07 ENCOUNTER — Encounter (HOSPITAL_COMMUNITY): Payer: Self-pay | Admitting: Gastroenterology

## 2019-11-07 DIAGNOSIS — Z87891 Personal history of nicotine dependence: Secondary | ICD-10-CM | POA: Insufficient documentation

## 2019-11-07 DIAGNOSIS — Z86010 Personal history of colonic polyps: Secondary | ICD-10-CM | POA: Insufficient documentation

## 2019-11-07 DIAGNOSIS — D125 Benign neoplasm of sigmoid colon: Secondary | ICD-10-CM | POA: Insufficient documentation

## 2019-11-07 DIAGNOSIS — K5909 Other constipation: Secondary | ICD-10-CM | POA: Diagnosis not present

## 2019-11-07 DIAGNOSIS — Z9981 Dependence on supplemental oxygen: Secondary | ICD-10-CM | POA: Insufficient documentation

## 2019-11-07 DIAGNOSIS — E669 Obesity, unspecified: Secondary | ICD-10-CM | POA: Insufficient documentation

## 2019-11-07 DIAGNOSIS — Z1211 Encounter for screening for malignant neoplasm of colon: Secondary | ICD-10-CM | POA: Insufficient documentation

## 2019-11-07 DIAGNOSIS — Z6830 Body mass index (BMI) 30.0-30.9, adult: Secondary | ICD-10-CM | POA: Insufficient documentation

## 2019-11-07 DIAGNOSIS — I2510 Atherosclerotic heart disease of native coronary artery without angina pectoris: Secondary | ICD-10-CM | POA: Diagnosis not present

## 2019-11-07 DIAGNOSIS — Z886 Allergy status to analgesic agent status: Secondary | ICD-10-CM | POA: Diagnosis not present

## 2019-11-07 DIAGNOSIS — K5730 Diverticulosis of large intestine without perforation or abscess without bleeding: Secondary | ICD-10-CM | POA: Insufficient documentation

## 2019-11-07 DIAGNOSIS — B1920 Unspecified viral hepatitis C without hepatic coma: Secondary | ICD-10-CM | POA: Insufficient documentation

## 2019-11-07 DIAGNOSIS — J9610 Chronic respiratory failure, unspecified whether with hypoxia or hypercapnia: Secondary | ICD-10-CM | POA: Insufficient documentation

## 2019-11-07 DIAGNOSIS — J449 Chronic obstructive pulmonary disease, unspecified: Secondary | ICD-10-CM | POA: Diagnosis not present

## 2019-11-07 DIAGNOSIS — Z8249 Family history of ischemic heart disease and other diseases of the circulatory system: Secondary | ICD-10-CM | POA: Insufficient documentation

## 2019-11-07 HISTORY — PX: POLYPECTOMY: SHX5525

## 2019-11-07 HISTORY — PX: COLONOSCOPY WITH PROPOFOL: SHX5780

## 2019-11-07 SURGERY — COLONOSCOPY WITH PROPOFOL
Anesthesia: Monitor Anesthesia Care

## 2019-11-07 MED ORDER — LACTATED RINGERS IV SOLN
INTRAVENOUS | Status: DC | PRN
Start: 2019-11-07 — End: 2019-11-07

## 2019-11-07 MED ORDER — PROPOFOL 500 MG/50ML IV EMUL
INTRAVENOUS | Status: AC
  Filled 2019-11-07: qty 50

## 2019-11-07 MED ORDER — PROPOFOL 500 MG/50ML IV EMUL
INTRAVENOUS | Status: DC | PRN
  Administered 2019-11-07: 125 ug/kg/min via INTRAVENOUS

## 2019-11-07 MED ORDER — SODIUM CHLORIDE 0.9 % IV SOLN: INTRAVENOUS | Status: DC

## 2019-11-07 MED ORDER — PROPOFOL 10 MG/ML IV BOLUS
INTRAVENOUS | Status: DC | PRN
  Administered 2019-11-07 (×2): 20 mg via INTRAVENOUS
  Administered 2019-11-07: 10 mg via INTRAVENOUS
  Administered 2019-11-07: 20 mg via INTRAVENOUS

## 2019-11-07 SURGICAL SUPPLY — 22 items

## 2019-11-07 NOTE — Anesthesia Postprocedure Evaluation (Signed)
Anesthesia Post Note  Patient: [name]  Procedure(s) Performed: COLONOSCOPY WITH PROPOFOL (N/A ) POLYPECTOMY     Patient location during evaluation: PACU Anesthesia Type: MAC Level of consciousness: awake and alert Pain management: pain level controlled Vital Signs Assessment: post-procedure vital signs reviewed and stable Respiratory status: spontaneous breathing, nonlabored ventilation and respiratory function stable Cardiovascular status: stable and blood pressure returned to baseline Anesthetic complications: no   No complications documented.  Last Vitals:  Vitals:   11/07/19 0805 11/07/19 0815  BP: 107/65 122/68  Pulse: 86 (!) 56  Resp: (!) 24 16  Temp:    SpO2: 99% 97%    Last Pain:  Vitals:   11/07/19 0815  TempSrc:   PainSc: 0-No pain                 Audry Pili
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
## 2019-11-07 NOTE — Transfer of Care (Signed)
Immediate Anesthesia Transfer of Care Note  Patient: [name]  Procedure(s) Performed: COLONOSCOPY WITH PROPOFOL (N/A ) POLYPECTOMY  Patient Location: PACU  Anesthesia Type:MAC  Level of Consciousness: sedated  Airway & Oxygen Therapy: Patient Spontanous Breathing and Patient connected to nasal cannula oxygen  Post-op Assessment: Report given to RN and Post -op Vital signs reviewed and stable  Post vital signs: Reviewed and stable  Last Vitals:  Vitals Value Taken Time  BP    Temp    Pulse    Resp    SpO2      Last Pain:  Vitals:   11/07/19 0659  TempSrc: Oral  PainSc: 0-No pain         Complications: No complications documented.

## 2019-11-07 NOTE — Discharge Instructions (Signed)

## 2019-11-07 NOTE — Op Note (Signed)
[facility] Patient Name: [name] Procedure Date: 11/07/2019 MRN: [id number] Attending MD: [name] , MD Date of Birth: 24-Sep-1960 CSN: [id number] Age: 59 Admit Type: Inpatient Procedure:                Colonoscopy with cold snare polypectomy x 1. Indications:              CRC screening for colorectal malignant neoplasm. Providers:                [name], MD, Cleda Daub, RN, Corie Chiquito,                            Technician, Mosinee Alday CRNA Referring MD:             Benito Mccreedy, MD, Beverley Fiedler, MD Medicines:                Monitored Anesthesia Care Complications:            No immediate complications. Estimated Blood Loss:     Estimated blood loss was minimal. Procedure:                Pre-Anesthesia Assessment: - Prior to the                            procedure, a history and physical was performed,                            and patient medications and allergies were                            reviewed. The patient's tolerance of previous                            anesthesia was also reviewed. The risks and                            benefits of the procedure and the sedation options                            and risks were discussed with the patient. All                            questions were answered, and informed consent was                            obtained. Prior Anticoagulants: The patient has                            taken Plavix (clopidogrel), last dose was 5 days                            prior to procedure. ASA Grade Assessment: IV - A                            patient with severe systemic disease that is a  constant threat to life. After reviewing the risks                            and benefits, the patient was deemed in                            satisfactory condition to undergo the procedure.                            After obtaining informed consent, the colonoscope                             was passed under direct vision. Throughout the                            procedure, the patient's blood pressure, pulse, and                            oxygen saturations were monitored continuously. The                            CF-HQ190L ([id number]) Olympus colonoscope was                            introduced through the anus and advanced to the the                            cecum, identified by appendiceal orifice and                            ileocecal valve. The colonoscopy was performed                            without difficulty. The patient tolerated the                            procedure well. The quality of the bowel                            preparation was adequate to identify polyps. The                            ileocecal valve, the appendiceal orifice and the                            rectum were photographed. The bowel preparation                            used was Miralax via split dose instruction. Scope In: 7:28:50 AM Scope Out: 7:46:33 AM Scope Withdrawal Time: 0 hours 11 minutes 8 seconds  Total Procedure Duration: 0 hours 17 minutes 43 seconds  Findings:      Few scattered small and large-mouthed diverticula were found in the       entire colon with more  predominant changes in the sigmoid colon.      A 7 mm sessile polyp was found in the mid-sigmoid colon; the polyp was       removed with a cold snare x 1 but was lost in the scope.      No additional abnormalities were found on retroflexion. Impression:               - Few scattered diverticula in the entire examined                            colon.                           - One 7 mm sessile polyp in the mid-sigmoid colon,                            removed with a cold snare x 1; resected but not                            retrieved. Moderate Sedation:      MAC used. Recommendation:           - High fiber diet with augmented water consumption                            daily.                            - Continue present medications.                           - Repeat colonoscopy in 7 years for surveillance.                           - Return to GI office PRN.                           - If the patient has any abnormal GI symptoms in                            the interim, she has been advised to call the                            office ASAP for further recommendations. Procedure Code(s):        --- Professional ---                           [phone], Colonoscopy, flexible; with removal of                            tumor(s), polyp(s), or other lesion(s) by snare                            technique Diagnosis Code(s):        --- Professional ---  Z12.11, Encounter for screening for malignant                            neoplasm of colon                           D12.5, Benign neoplasm of sigmoid colon                           K57.30, Diverticulosis of large intestine without                            perforation or abscess without bleeding CPT copyright 2019 American Medical Association. All rights reserved. The codes documented in this report are preliminary and upon coder review may  be revised to meet current compliance requirements. [name], MD [name], MD 11/07/2019 7:59:00 AM This report has been signed electronically. Number of Addenda: 0

## 2019-11-08 ENCOUNTER — Encounter (HOSPITAL_COMMUNITY): Payer: Self-pay | Admitting: Gastroenterology

## 2019-11-11 NOTE — Telephone Encounter (Signed)
Completed handicap placard placed in envelope at front desk. Patient requested to pick up 11/15/19, around 4pm.  Nothing further at this time.

## 2019-12-11 ENCOUNTER — Telehealth: Payer: Self-pay | Admitting: Pulmonary Disease

## 2019-12-11 DIAGNOSIS — J438 Other emphysema: Secondary | ICD-10-CM

## 2019-12-11 MED ORDER — ALBUTEROL SULFATE (2.5 MG/3ML) 0.083% IN NEBU: 2.5000 mg | INHALATION_SOLUTION | Freq: Four times a day (QID) | RESPIRATORY_TRACT | 5 refills | Status: DC

## 2019-12-11 NOTE — Telephone Encounter (Signed)
Rx sent to preferred pharmacy for pt. Called and spoke with pt letting her know this had been done and she verbalized understanding. Nothing further needed. 

## 2019-12-13 ENCOUNTER — Telehealth: Payer: Self-pay | Admitting: Pulmonary Disease

## 2019-12-13 MED ORDER — BREZTRI AEROSPHERE 160-9-4.8 MCG/ACT IN AERO: 2.0000 | INHALATION_SPRAY | Freq: Two times a day (BID) | RESPIRATORY_TRACT | 6 refills | Status: DC

## 2019-12-13 NOTE — Telephone Encounter (Signed)
Unable to reach patient, unable to leave a message, refill for [facility] sent to preferred pharmacy.

## 2019-12-31 ENCOUNTER — Other Ambulatory Visit: Payer: Self-pay

## 2019-12-31 ENCOUNTER — Ambulatory Visit (INDEPENDENT_AMBULATORY_CARE_PROVIDER_SITE_OTHER)
Admission: RE | Admit: 2019-12-31 | Discharge: 2019-12-31 | Disposition: A | Discharge status: Home / Self Care | Payer: BC Managed Care – PPO | Source: Ambulatory Visit | Attending: Acute Care | Admitting: Acute Care

## 2019-12-31 DIAGNOSIS — Z87891 Personal history of nicotine dependence: Secondary | ICD-10-CM | POA: Diagnosis not present

## 2019-12-31 DIAGNOSIS — Z122 Encounter for screening for malignant neoplasm of respiratory organs: Secondary | ICD-10-CM

## 2020-01-01 ENCOUNTER — Telehealth: Payer: Self-pay | Admitting: Acute Care

## 2020-01-01 ENCOUNTER — Other Ambulatory Visit: Payer: Self-pay | Admitting: Cardiology

## 2020-01-01 MED ORDER — AMLODIPINE BESYLATE 5 MG PO TABS: 5.0000 mg | ORAL_TABLET | Freq: Every day | ORAL | 0 refills | Status: DC

## 2020-01-01 NOTE — Telephone Encounter (Signed)
Spoke with Levander Campion about patient CT results. Will forward to Judson Roch to let her know its back.  IMPRESSION: 1. Lung-RADS 4A, suspicious. Follow up low-dose chest CT without contrast in 3 months (please use the following order, "CT CHEST LCS NODULE FOLLOW-UP W/O CM") is recommended. Alternatively, PET may be considered when there is a solid component 22mm or larger. Right lower lobe process which is favored to represent infection or aspiration, given morphology. Correlate with infectious/inflammatory symptoms. Consider antibiotic therapy prior to follow-up. 2. Age advanced coronary artery atherosclerosis. Recommend assessment of coronary risk factors and consideration of medical therapy. 3. Aortic atherosclerosis (ICD10-I70.0) and emphysema (ICD10-J43.9).  These results will be called to the ordering clinician or representative by the Radiologist Assistant, and communication documented in the PACS or [employer].

## 2020-01-01 NOTE — Telephone Encounter (Signed)
I will call these results to the patient. Thanks

## 2020-01-01 NOTE — Telephone Encounter (Signed)
Refill sent in with note to please schedule follow up visit for further refills

## 2020-01-01 NOTE — Telephone Encounter (Signed)
° ° ° °*  STAT* If patient is at the pharmacy, call can be transferred to refill team.   1. Which medications need to be refilled? (please list name of each medication and dose if known)   amLODipine (NORVASC) 5 MG tablet    2. Which pharmacy/location (including street and city if local pharmacy) is medication to be sent to? [id number] - [place], [place] - [address]  3. Do they need a 30 day or 90 day supply? 90 days

## 2020-01-02 ENCOUNTER — Other Ambulatory Visit: Payer: Self-pay | Admitting: *Deleted

## 2020-01-02 ENCOUNTER — Telehealth: Payer: Self-pay | Admitting: Pulmonary Disease

## 2020-01-02 DIAGNOSIS — Z87891 Personal history of nicotine dependence: Secondary | ICD-10-CM

## 2020-01-02 NOTE — Telephone Encounter (Signed)
01/02/2020  Please see the message below from Eric Form, NP.  Can we get the patient scheduled for a follow-up with either her primary pulmonologist or myself within the next 2 weeks.  Please contact the patient and get them scheduled.  Wyn Quaker, FNP

## 2020-01-02 NOTE — Telephone Encounter (Signed)
-----   Message from Magdalen Spatz, NP sent at 01/02/2020  1:17 PM EDT ----- Regarding: Needs follow up with you This patient had a lung cancer screening scan that was read as a Lung RADS 4 A : suspicious findings, either short term follow up in 3 months or alternatively  PET Scan evaluation may be considered when there is a solid component of  8 mm or larger. There was an infectious component to it. I have asked her to call and get an appointment with you. She says her COPD has been flaring up. She is doing that now. I will schedule her for a 3 month follow up. Let me know if you have any questions. Thanks so much

## 2020-01-02 NOTE — Telephone Encounter (Signed)
Called and spoke with Patient.  Brian,NP recommendations given.  Patient is scheduled 01/03/20, with Tammy,NP. Nothing further at this time.

## 2020-01-02 NOTE — Progress Notes (Signed)
I have called the patient with the results of her low-dose CT scan.  I explained that her scan was read as a lung RADS 4A, suspicious, 81-month follow-up recommended.  Additionally there was notation of infectious/inflammatory components to the reading.  The patient had Covid in November, and she also has COPD that is managed by lobar pulmonary.  She is calling to make an appointment to see her provider there today.  We have also discussed that she has aged advanced coronary artery disease.  She states that she has been tried on 3 different statins by her cardiologist and they all affect her breathing.  She has annual follow-up with cardiology, and I have told her she needs to go ahead and schedule that. Patient is in agreement with 27-month follow-up CT.  I have explained that she will get a call to schedule it closer to the time.  She has the office contact number in the event she has any further questions. Denise please schedule III month follow-up low-dose CT, and also fax results to PCP.  Thank you so much

## 2020-01-03 ENCOUNTER — Encounter: Payer: Self-pay | Admitting: Adult Health

## 2020-01-03 ENCOUNTER — Ambulatory Visit: Payer: BC Managed Care – PPO | Admitting: Adult Health

## 2020-01-03 ENCOUNTER — Other Ambulatory Visit: Payer: Self-pay

## 2020-01-03 DIAGNOSIS — J441 Chronic obstructive pulmonary disease with (acute) exacerbation: Secondary | ICD-10-CM

## 2020-01-03 MED ORDER — PREDNISONE 10 MG PO TABS: ORAL_TABLET | ORAL | 0 refills | Status: DC

## 2020-01-03 MED ORDER — AMOXICILLIN-POT CLAVULANATE 875-125 MG PO TABS: 1.0000 | ORAL_TABLET | Freq: Two times a day (BID) | ORAL | 0 refills | Status: DC

## 2020-01-03 MED ORDER — BREZTRI AEROSPHERE 160-9-4.8 MCG/ACT IN AERO: 2.0000 | INHALATION_SPRAY | Freq: Two times a day (BID) | RESPIRATORY_TRACT | 0 refills | Status: DC

## 2020-01-03 NOTE — Patient Instructions (Addendum)
Augmentin 875mg  Twice daily  For 7days  Prednisone taper over next week  Mucinex DM Twice daily  As needed  Cough/congestion  Restart BREZTRI 2 puffs Twice daily, rinse after use  Albuterol inhaler or neb As needed   Follow up with Dr. [name] in 4 weeks and As needed   Please contact office for sooner follow up if symptoms do not improve or worsen or seek emergency care

## 2020-01-03 NOTE — Assessment & Plan Note (Addendum)
Exacerbation +/- RLL PNA  F/up in 1 month  CT chest in 3 months as planned   Plan  Patient Instructions  Augmentin 875mg  Twice daily  For 7days  Prednisone taper over next week  Mucinex DM Twice daily  As needed  Cough/congestion  Restart BREZTRI 2 puffs Twice daily, rinse after use  Albuterol inhaler or neb As needed   Follow up with Dr. [name] in 4 weeks and As needed   Please contact office for sooner follow up if symptoms do not improve or worsen or seek emergency care
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
## 2020-01-08 ENCOUNTER — Other Ambulatory Visit: Payer: Self-pay

## 2020-01-08 ENCOUNTER — Emergency Department (HOSPITAL_COMMUNITY): Payer: BC Managed Care – PPO

## 2020-01-08 ENCOUNTER — Encounter (HOSPITAL_COMMUNITY): Payer: Self-pay | Admitting: Internal Medicine

## 2020-01-08 ENCOUNTER — Telehealth: Payer: Self-pay | Admitting: Adult Health

## 2020-01-08 ENCOUNTER — Inpatient Hospital Stay (HOSPITAL_COMMUNITY)
Admission: EM | Admit: 2020-01-08 | Discharge: 2020-01-09 | DRG: 190 | Disposition: A | Discharge status: Home / Self Care | Payer: BC Managed Care – PPO | Attending: Family Medicine | Admitting: Family Medicine

## 2020-01-08 DIAGNOSIS — Z8701 Personal history of pneumonia (recurrent): Secondary | ICD-10-CM

## 2020-01-08 DIAGNOSIS — Z8249 Family history of ischemic heart disease and other diseases of the circulatory system: Secondary | ICD-10-CM | POA: Diagnosis not present

## 2020-01-08 DIAGNOSIS — F1410 Cocaine abuse, uncomplicated: Secondary | ICD-10-CM | POA: Diagnosis present

## 2020-01-08 DIAGNOSIS — J440 Chronic obstructive pulmonary disease with acute lower respiratory infection: Secondary | ICD-10-CM | POA: Diagnosis present

## 2020-01-08 DIAGNOSIS — Z79899 Other long term (current) drug therapy: Secondary | ICD-10-CM | POA: Diagnosis not present

## 2020-01-08 DIAGNOSIS — J441 Chronic obstructive pulmonary disease with (acute) exacerbation: Principal | ICD-10-CM | POA: Diagnosis present

## 2020-01-08 DIAGNOSIS — R768 Other specified abnormal immunological findings in serum: Secondary | ICD-10-CM | POA: Diagnosis not present

## 2020-01-08 DIAGNOSIS — Z825 Family history of asthma and other chronic lower respiratory diseases: Secondary | ICD-10-CM

## 2020-01-08 DIAGNOSIS — J449 Chronic obstructive pulmonary disease, unspecified: Secondary | ICD-10-CM | POA: Diagnosis present

## 2020-01-08 DIAGNOSIS — Z87891 Personal history of nicotine dependence: Secondary | ICD-10-CM | POA: Diagnosis not present

## 2020-01-08 DIAGNOSIS — E669 Obesity, unspecified: Secondary | ICD-10-CM | POA: Diagnosis present

## 2020-01-08 DIAGNOSIS — Z8616 Personal history of COVID-19: Secondary | ICD-10-CM | POA: Diagnosis not present

## 2020-01-08 DIAGNOSIS — Z823 Family history of stroke: Secondary | ICD-10-CM

## 2020-01-08 DIAGNOSIS — J9621 Acute and chronic respiratory failure with hypoxia: Secondary | ICD-10-CM | POA: Diagnosis present

## 2020-01-08 DIAGNOSIS — E785 Hyperlipidemia, unspecified: Secondary | ICD-10-CM | POA: Diagnosis present

## 2020-01-08 DIAGNOSIS — Z7952 Long term (current) use of systemic steroids: Secondary | ICD-10-CM

## 2020-01-08 DIAGNOSIS — B1920 Unspecified viral hepatitis C without hepatic coma: Secondary | ICD-10-CM | POA: Diagnosis present

## 2020-01-08 DIAGNOSIS — Z20822 Contact with and (suspected) exposure to covid-19: Secondary | ICD-10-CM | POA: Diagnosis present

## 2020-01-08 DIAGNOSIS — F1411 Cocaine abuse, in remission: Secondary | ICD-10-CM | POA: Diagnosis present

## 2020-01-08 DIAGNOSIS — Z6830 Body mass index (BMI) 30.0-30.9, adult: Secondary | ICD-10-CM

## 2020-01-08 DIAGNOSIS — I2510 Atherosclerotic heart disease of native coronary artery without angina pectoris: Secondary | ICD-10-CM | POA: Diagnosis present

## 2020-01-08 DIAGNOSIS — K219 Gastro-esophageal reflux disease without esophagitis: Secondary | ICD-10-CM | POA: Diagnosis present

## 2020-01-08 DIAGNOSIS — U071 COVID-19: Secondary | ICD-10-CM

## 2020-01-08 DIAGNOSIS — R0603 Acute respiratory distress: Secondary | ICD-10-CM

## 2020-01-08 LAB — URINALYSIS, ROUTINE W REFLEX MICROSCOPIC
Bilirubin Urine: NEGATIVE
Glucose, UA: NEGATIVE mg/dL
Hgb urine dipstick: NEGATIVE
Ketones, ur: NEGATIVE mg/dL
Nitrite: NEGATIVE
Protein, ur: NEGATIVE mg/dL
Specific Gravity, Urine: 1.002 — ABNORMAL LOW (ref 1.005–1.030)
pH: 6 (ref 5.0–8.0)

## 2020-01-08 LAB — CBC WITH DIFFERENTIAL/PLATELET
Abs Immature Granulocytes: 0.07 10*3/uL (ref 0.00–0.07)
Basophils Absolute: 0.1 10*3/uL (ref 0.0–0.1)
Basophils Relative: 1 %
Eosinophils Absolute: 0.1 10*3/uL (ref 0.0–0.5)
Eosinophils Relative: 1 %
HCT: 39.4 % (ref 36.0–46.0)
Hemoglobin: 12.4 g/dL (ref 12.0–15.0)
Immature Granulocytes: 1 %
Lymphocytes Relative: 29 %
Lymphs Abs: 3.1 10*3/uL (ref 0.7–4.0)
MCH: 29.7 pg (ref 26.0–34.0)
MCHC: 31.5 g/dL (ref 30.0–36.0)
MCV: 94.5 fL (ref 80.0–100.0)
Monocytes Absolute: 0.9 10*3/uL (ref 0.1–1.0)
Monocytes Relative: 8 %
Neutro Abs: 6.3 10*3/uL (ref 1.7–7.7)
Neutrophils Relative %: 60 %
Platelets: 271 10*3/uL (ref 150–400)
RBC: 4.17 MIL/uL (ref 3.87–5.11)
RDW: 14.5 % (ref 11.5–15.5)
WBC: 10.4 10*3/uL (ref 4.0–10.5)
nRBC: 0 % (ref 0.0–0.2)

## 2020-01-08 LAB — COMPREHENSIVE METABOLIC PANEL
ALT: 15 U/L (ref 0–44)
AST: 22 U/L (ref 15–41)
Albumin: 4.4 g/dL (ref 3.5–5.0)
Alkaline Phosphatase: 54 U/L (ref 38–126)
Anion gap: 8 (ref 5–15)
BUN: 10 mg/dL (ref 8–23)
CO2: 28 mmol/L (ref 22–32)
Calcium: 9.2 mg/dL (ref 8.9–10.3)
Chloride: 105 mmol/L (ref 98–111)
Creatinine, Ser: 0.71 mg/dL (ref 0.44–1.00)
GFR calc Af Amer: >60 mL/min (ref >60)
GFR calc non Af Amer: >60 mL/min (ref >60)
Glucose, Bld: 70 mg/dL (ref 70–99)
Potassium: 3.3 mmol/L — ABNORMAL LOW (ref 3.5–5.1)
Sodium: 141 mmol/L (ref 135–145)
Total Bilirubin: 0.5 mg/dL (ref 0.3–1.2)
Total Protein: 7.5 g/dL (ref 6.5–8.1)

## 2020-01-08 LAB — HIV ANTIBODY (ROUTINE TESTING W REFLEX): HIV Screen 4th Generation wRfx: NONREACTIVE

## 2020-01-08 LAB — SARS CORONAVIRUS 2 BY RT PCR (HOSPITAL ORDER, PERFORMED IN ~~LOC~~ HOSPITAL LAB): SARS Coronavirus 2: NEGATIVE

## 2020-01-08 LAB — BRAIN NATRIURETIC PEPTIDE: B Natriuretic Peptide: 85.3 pg/mL (ref 0.0–100.0)

## 2020-01-08 MED ORDER — AZITHROMYCIN 250 MG PO TABS
500.0000 mg | ORAL_TABLET | Freq: Every day | ORAL | Status: DC
  Administered 2020-01-08: 500 mg via ORAL
  Filled 2020-01-08: qty 2

## 2020-01-08 MED ORDER — ALBUTEROL SULFATE HFA 108 (90 BASE) MCG/ACT IN AERS
2.0000 | INHALATION_SPRAY | Freq: Once | RESPIRATORY_TRACT | Status: AC
  Administered 2020-01-08: 2 via RESPIRATORY_TRACT
  Filled 2020-01-08: qty 6.7

## 2020-01-08 MED ORDER — FAMOTIDINE 20 MG PO TABS
20.0000 mg | ORAL_TABLET | Freq: Every day | ORAL | Status: DC
  Administered 2020-01-08: 20 mg via ORAL
  Filled 2020-01-08: qty 1

## 2020-01-08 MED ORDER — IPRATROPIUM-ALBUTEROL 0.5-2.5 (3) MG/3ML IN SOLN: 3.0000 mL | RESPIRATORY_TRACT | Status: DC | PRN

## 2020-01-08 MED ORDER — METHYLPREDNISOLONE SODIUM SUCC 125 MG IJ SOLR: 60.0000 mg | Freq: Two times a day (BID) | INTRAMUSCULAR | Status: DC

## 2020-01-08 MED ORDER — IPRATROPIUM-ALBUTEROL 0.5-2.5 (3) MG/3ML IN SOLN
3.0000 mL | Freq: Four times a day (QID) | RESPIRATORY_TRACT | Status: DC
  Administered 2020-01-08 – 2020-01-09 (×4): 3 mL via RESPIRATORY_TRACT
  Filled 2020-01-08 (×4): qty 3

## 2020-01-08 MED ORDER — DEXAMETHASONE SODIUM PHOSPHATE 10 MG/ML IJ SOLN
10.0000 mg | Freq: Once | INTRAMUSCULAR | Status: AC
  Administered 2020-01-08: 10 mg via INTRAVENOUS
  Filled 2020-01-08: qty 1

## 2020-01-08 MED ORDER — METHYLPREDNISOLONE SODIUM SUCC 125 MG IJ SOLR
60.0000 mg | Freq: Two times a day (BID) | INTRAMUSCULAR | Status: DC
  Administered 2020-01-09: 60 mg via INTRAVENOUS
  Filled 2020-01-08: qty 2

## 2020-01-08 MED ORDER — CLOPIDOGREL BISULFATE 75 MG PO TABS
75.0000 mg | ORAL_TABLET | Freq: Every day | ORAL | Status: DC
  Administered 2020-01-08 – 2020-01-09 (×2): 75 mg via ORAL
  Filled 2020-01-08 (×2): qty 1

## 2020-01-08 MED ORDER — IBUPROFEN 200 MG PO TABS: 400.0000 mg | ORAL_TABLET | Freq: Four times a day (QID) | ORAL | Status: DC | PRN

## 2020-01-08 MED ORDER — SODIUM CHLORIDE 0.9 % IV SOLN
2.0000 g | Freq: Every day | INTRAVENOUS | Status: DC
  Administered 2020-01-08: 2 g via INTRAVENOUS
  Filled 2020-01-08: qty 20

## 2020-01-08 MED ORDER — ENOXAPARIN SODIUM 40 MG/0.4ML ~~LOC~~ SOLN
40.0000 mg | SUBCUTANEOUS | Status: DC
  Administered 2020-01-08: 40 mg via SUBCUTANEOUS
  Filled 2020-01-08: qty 0.4

## 2020-01-08 MED ORDER — AMLODIPINE BESYLATE 5 MG PO TABS
5.0000 mg | ORAL_TABLET | Freq: Every day | ORAL | Status: DC
  Administered 2020-01-08 – 2020-01-09 (×2): 5 mg via ORAL
  Filled 2020-01-08 (×2): qty 1

## 2020-01-08 NOTE — Telephone Encounter (Signed)
Called and spoke with patient.  I was able to get as far as explaining about needing to go to the ER for further evaluation since she is no better-worse with the Augmentin/Prednisone and albuterol.  She hung up prior to being able to tell her that she may need to be Covid tested as well.  Also was not able to tell her to contact our office for sooner follow up if symptoms do not improve or worsen or seek emergency care.  Closing encounter.

## 2020-01-08 NOTE — Progress Notes (Signed)
Pt seen and given scheduled nebulizer treatment which she tolerated well.  Pt is watching tv, no increased wob/respiratory distress noted or voiced by pt at this time.  HR90, rr22, spo2 97% on 2lnc.  Bipap in room on standby but not indicated at this time.
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
## 2020-01-08 NOTE — Telephone Encounter (Signed)
Spoke with the pt  She was just seen 01/03/20 by TP and given the following plan:  Plan  Patient Instructions  Augmentin 875mg  Twice daily  For 7days  Prednisone taper over next week  Mucinex DM Twice daily  As needed  Cough/congestion  Restart BREZTRI 2 puffs Twice daily, rinse after use  Albuterol inhaler or neb As needed   Follow up with Dr. [name] in 4 weeks and As needed   Please contact office for sooner follow up if symptoms do not improve or worsen or seek emergency care   She states following all directions and feels her breathing is progressively getting worse  She states she feels "can't get in any air"  She is using neb 3 x daily on average without relief  She states that she is coughing and wheezing- non prod cough  Denies any f/c/s, CP, leg swelling, body aches or other symptoms  Has had her covid vaccines  She is asking for further recs  Please advise, thanks!

## 2020-01-08 NOTE — Telephone Encounter (Signed)
Sorry she is not feeling better. If she is taking Augmentin /prednisone and albuterol and no better -worse . Will need to go to ER for further evaluation , may need to be admitted.  May need COVID testing as well.  Please contact office for sooner follow up if symptoms do not improve or worsen or seek emergency care

## 2020-01-08 NOTE — ED Notes (Signed)
Pt transported to Xray. 
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
## 2020-01-08 NOTE — ED Triage Notes (Signed)
Pt states she has a hx of COPD and was recently dx with PNA. Started having SOB since yesterday. Alert and oriented.

## 2020-01-08 NOTE — ED Notes (Signed)
Respiratory d/c Bipap

## 2020-01-09 LAB — CBC
HCT: 36.2 % (ref 36.0–46.0)
Hemoglobin: 11.4 g/dL — ABNORMAL LOW (ref 12.0–15.0)
MCH: 29.2 pg (ref 26.0–34.0)
MCHC: 31.5 g/dL (ref 30.0–36.0)
MCV: 92.8 fL (ref 80.0–100.0)
Platelets: 245 10*3/uL (ref 150–400)
RBC: 3.9 MIL/uL (ref 3.87–5.11)
RDW: 14.1 % (ref 11.5–15.5)
WBC: 7.7 10*3/uL (ref 4.0–10.5)
nRBC: 0 % (ref 0.0–0.2)

## 2020-01-09 LAB — COMPREHENSIVE METABOLIC PANEL
ALT: 15 U/L (ref 0–44)
AST: 14 U/L — ABNORMAL LOW (ref 15–41)
Albumin: 3.8 g/dL (ref 3.5–5.0)
Alkaline Phosphatase: 55 U/L (ref 38–126)
Anion gap: 10 (ref 5–15)
BUN: 16 mg/dL (ref 8–23)
CO2: 27 mmol/L (ref 22–32)
Calcium: 9 mg/dL (ref 8.9–10.3)
Chloride: 101 mmol/L (ref 98–111)
Creatinine, Ser: 0.64 mg/dL (ref 0.44–1.00)
GFR calc Af Amer: >60 mL/min (ref >60)
GFR calc non Af Amer: >60 mL/min (ref >60)
Glucose, Bld: 131 mg/dL — ABNORMAL HIGH (ref 70–99)
Potassium: 3.9 mmol/L (ref 3.5–5.1)
Sodium: 138 mmol/L (ref 135–145)
Total Bilirubin: 0.7 mg/dL (ref 0.3–1.2)
Total Protein: 6.6 g/dL (ref 6.5–8.1)

## 2020-01-09 MED ORDER — EPINEPHRINE 0.3 MG/0.3ML IJ SOAJ: 0.3000 mg | Freq: Once | INTRAMUSCULAR | Status: DC | PRN

## 2020-01-09 MED ORDER — PREDNISONE 50 MG PO TABS
50.0000 mg | ORAL_TABLET | Freq: Every day | ORAL | 0 refills | Status: DC
Start: 2020-01-09 — End: 2020-05-12

## 2020-01-09 MED ORDER — AZITHROMYCIN 250 MG PO TABS: ORAL_TABLET | ORAL | 0 refills | Status: DC

## 2020-01-09 MED ORDER — DIPHENHYDRAMINE HCL 50 MG/ML IJ SOLN: 50.0000 mg | Freq: Once | INTRAMUSCULAR | Status: DC | PRN

## 2020-01-09 MED ORDER — ALBUTEROL SULFATE (2.5 MG/3ML) 0.083% IN NEBU: 2.5000 mg | INHALATION_SOLUTION | Freq: Once | RESPIRATORY_TRACT | Status: DC | PRN

## 2020-01-09 MED ORDER — SODIUM CHLORIDE 0.9 % IV SOLN: 700.0000 mg | Freq: Once | INTRAVENOUS | Status: DC

## 2020-01-09 MED ORDER — METHYLPREDNISOLONE SODIUM SUCC 125 MG IJ SOLR: 125.0000 mg | Freq: Once | INTRAMUSCULAR | Status: DC | PRN

## 2020-01-09 MED ORDER — SODIUM CHLORIDE 0.9 % IV SOLN: INTRAVENOUS | Status: DC | PRN

## 2020-01-09 MED ORDER — FAMOTIDINE IN NACL 20-0.9 MG/50ML-% IV SOLN: 20.0000 mg | Freq: Once | INTRAVENOUS | Status: DC | PRN

## 2020-01-09 NOTE — ED Notes (Signed)
Pt ambulated 166ft , stats started at 100 O2. At the end of ambulation O2 stats were at 84.

## 2020-01-09 NOTE — ED Notes (Signed)
Patient walking to restroom

## 2020-01-09 NOTE — ED Notes (Signed)
Discharge paperwork with patient.

## 2020-01-09 NOTE — ED Notes (Signed)
Called to bedside, patient was ambulating on the unit and oxygen saturations dropped to 84%. Patient appears to be hemodynamically stable.  Patient is not in any distress currently. MD to see patient  Vital signs stable

## 2020-01-09 NOTE — Discharge Summary (Signed)
Physician Discharge Summary  [name] PVV:[id number] DOB: [birth year] DOA: 01/08/2020  PCP: Tami Mccreedy, MD  Admit date: 01/08/2020 Discharge date: 01/09/2020  Time spent: 35 minutes  Recommendations for Outpatient Follow-up:  1. Will need outpatient pulmonology follow-up I will CC [name] 2. Needs outpatient CT scan 3. Recommend labs in about 1 week and completion of prednisone and azithromycin  Discharge Diagnoses:  Principal Problem:   COPD exacerbation ([place]) Active Problems:   COPD (chronic obstructive pulmonary disease) (HCC)   GERD (gastroesophageal reflux disease)   Hepatitis C antibody test positive   History of cocaine abuse (HCC)   Obesity (BMI 30-39.9)   History of COVID-19   Discharge Condition: Improved  Diet recommendation: Regular  There were no vitals filed for this visit.  History of present illness:  59 year old black female Severe COPD FVC 60% FEV1 0.7 DLCO 32% at last check Prior polysubstance abuse with cocaine Hepatitis C Small pulmonary nodules Treated for COVID-19 infection 11/24 through 04/14/2019-vaccinated 08/2019 against Covid Seen in pulmonology office 01/03/2020 with COPD exacerbation +/- right lower lobe pneumonia-given Augmentin 875 X 1 week  Presented to emergency room 8/25 with increased work of breathing she was given MDI with good effect checked and patient was placed on BiPAP temporarily and weaned off rapidly She had relatively poor air movement initially however by the next day she looked much much better  She ambulated in the hallway and walked over 120 feet She desatted to the mid 80s but improved rapidly with return to the room and she already has oxygen at home which she uses at night 3 L-I do not suspect she will need further in-hospital care She had no wheeze she had no fever she had no chills She had no chest pain she had no nausea she had no vomiting   Hospital Course:  Patient was seen in the emergency  room day subsequent to discharge By myself and we had a long discussion about her taking her inhalers and compliant with medical regimen as has been recommended to her She feels comfortable with this plan and will follow up with pulmonology   Discharge Exam: Vitals:   01/09/20 0919 01/09/20 1000  BP:  113/74  Pulse:  91  Resp:  18  Temp:    SpO2: 97% 95%    General: Awake alert coherent no distress Cardiovascular: S1-S2 no murmur rub or gallop  Abdomen soft nontender no rebound or guarding Neurologically intact Respiratory: Clear no added sound no rales no rhonchi  Discharge Instructions   Discharge Instructions    Diet - low sodium heart healthy   Complete by: As directed    Diet - low sodium heart healthy   Complete by: As directed    Discharge instructions   Complete by: As directed    Please ensure that you take your medications according to what has been written for you Please follow-up with [name] at pulmonology Ensure that you use your oxygen at night   Increase activity slowly   Complete by: As directed    Increase activity slowly   Complete by: As directed      Allergies as of 01/09/2020      Reactions   Tylenol [acetaminophen] Shortness Of Breath      Medication List    STOP taking these medications   amoxicillin-clavulanate 875-125 MG tablet Commonly known as: Augmentin     TAKE these medications   amLODipine 5 MG tablet Commonly known as: NORVASC Take 1 tablet (5  mg total) by mouth daily. Please schedule follow up visit for further refills   azithromycin 250 MG tablet Commonly known as: ZITHROMAX Finish all tabs   Breztri Aerosphere 160-9-4.8 MCG/ACT Aero Generic drug: Budeson-Glycopyrrol-Formoterol Inhale 2 puffs into the lungs in the morning and at bedtime. What changed: Another medication with the same name was removed. Continue taking this medication, and follow the directions you see here.   clopidogrel 75 MG tablet Commonly known as:  PLAVIX TAKE 1 TABLET BY MOUTH EVERY DAY   famotidine 20 MG tablet Commonly known as: PEPCID Take 20 mg by mouth at bedtime.   montelukast 10 MG tablet Commonly known as: SINGULAIR TAKE 1 TABLET BY MOUTH EVERYDAY AT BEDTIME What changed: See the new instructions.   nitroGLYCERIN 0.4 MG SL tablet Commonly known as: NITROSTAT Place 1 tablet (0.4 mg total) under the tongue every 5 (five) minutes as needed for chest pain.   OXYGEN Inhale 3 L into the lungs at bedtime.   predniSONE 50 MG tablet Commonly known as: DELTASONE Take 1 tablet (50 mg total) by mouth daily. What changed:   medication strength  how much to take  Another medication with the same name was removed. Continue taking this medication, and follow the directions you see here.   ProAir HFA 108 (90 Base) MCG/ACT inhaler Generic drug: albuterol Inhale 2 puffs into the lungs every 4 (four) hours as needed for wheezing or shortness of breath. What changed: Another medication with the same name was changed. Make sure you understand how and when to take each.   albuterol (2.5 MG/3ML) 0.083% nebulizer solution Commonly known as: PROVENTIL Take 3 mLs (2.5 mg total) by nebulization every 6 (six) hours. What changed: when to take this      Allergies  Allergen Reactions  . Tylenol [Acetaminophen] Shortness Of Breath      The results of significant diagnostics from this hospitalization (including imaging, microbiology, ancillary and laboratory) are listed below for reference.    Significant Diagnostic Studies: DG Chest 2 View  Result Date: 01/08/2020 CLINICAL DATA:  Shortness of breath, COPD EXAM: CHEST - 2 VIEW COMPARISON:  10/21/2019 FINDINGS: The heart size and mediastinal contours are stable. Pulmonary hyperinflation. Chronic scarring within the right upper lobe. No focal airspace consolidation, pleural effusion, or pneumothorax. The visualized skeletal structures are unremarkable. IMPRESSION: COPD without acute  cardiopulmonary process. Electronically Signed   By: Davina Poke D.O.   On: 01/08/2020 12:06   CT CHEST LUNG CANCER SCREENING LOW DOSE WO CONTRAST  Result Date: 01/01/2020 CLINICAL DATA:  Ex-smoker, quitting 10 years ago. Thirty-five pack-year history. EXAM: CT CHEST WITHOUT CONTRAST LOW-DOSE FOR LUNG CANCER SCREENING TECHNIQUE: Multidetector CT imaging of the chest was performed following the standard protocol without IV contrast. COMPARISON:  10/21/2019 chest radiograph.  Chest CT 12/13/2018 FINDINGS: Cardiovascular: Aortic atherosclerosis. Tortuous thoracic aorta. Mild cardiomegaly, without pericardial effusion. Lad coronary artery calcification. Mediastinum/Nodes: No mediastinal or definite hilar adenopathy, given limitations of unenhanced CT. Lungs/Pleura: No pleural fluid. Mild centrilobular emphysema. Areas of subsegmental atelectasis in both posterior upper lobes. Small pulmonary nodules are not significantly changed, including at maximally volume derived equivalent diameter 3.8 mm. There is also a new area of right lower lobe opacity laterally which measures volume derived equivalent diameter 7.4 mm, including on 208/3. There is bronchial wall thickening and mucoid impaction in the subtending bronchi, increased. Upper Abdomen: Normal imaged portions of the liver, spleen, stomach, pancreas, adrenal glands, kidneys. Musculoskeletal: Cervical and upper thoracic spondylosis. IMPRESSION: 1. Lung-RADS  4A, suspicious. Follow up low-dose chest CT without contrast in 3 months (please use the following order, "CT CHEST LCS NODULE FOLLOW-UP W/O CM") is recommended. Alternatively, PET may be considered when there is a solid component 82mm or larger. Right lower lobe process which is favored to represent infection or aspiration, given morphology. Correlate with infectious/inflammatory symptoms. Consider antibiotic therapy prior to follow-up. 2. Age advanced coronary artery atherosclerosis. Recommend assessment  of coronary risk factors and consideration of medical therapy. 3. Aortic atherosclerosis (ICD10-I70.0) and emphysema (ICD10-J43.9). These results will be called to the ordering clinician or representative by the Radiologist Assistant, and communication documented in the PACS or [employer]. Electronically Signed   By: Abigail Miyamoto M.D.   On: 01/01/2020 11:42    Microbiology: Recent Results (from the past 240 hour(s))  SARS Coronavirus 2 by RT PCR (hospital order, performed in [facility] hospital lab) Nasopharyngeal Nasopharyngeal Swab     Status: None   Collection Time: 01/08/20 11:30 AM   Specimen: Nasopharyngeal Swab  Result Value Ref Range Status   SARS Coronavirus 2 NEGATIVE NEGATIVE Final    Comment: (NOTE) SARS-CoV-2 target nucleic acids are NOT DETECTED.  The SARS-CoV-2 RNA is generally detectable in upper and lower respiratory specimens during the acute phase of infection. The lowest concentration of SARS-CoV-2 viral copies this assay can detect is 250 copies / mL. A negative result does not preclude SARS-CoV-2 infection and should not be used as the sole basis for treatment or other patient management decisions.  A negative result may occur with improper specimen collection / handling, submission of specimen other than nasopharyngeal swab, presence of viral mutation(s) within the areas targeted by this assay, and inadequate number of viral copies (<250 copies / mL). A negative result must be combined with clinical observations, patient history, and epidemiological information.  Fact Sheet for Patients:   StrictlyIdeas.no  Fact Sheet for Healthcare Providers: BankingDealers.co.za  This test is not yet approved or  cleared by the [place] FDA and has been authorized for detection and/or diagnosis of SARS-CoV-2 by FDA under an Emergency Use Authorization (EUA).  This EUA will remain in effect (meaning this test can be  used) for the duration of the COVID-19 declaration under Section 564(b)(1) of the Act, 21 U.S.C. section 360bbb-3(b)(1), unless the authorization is terminated or revoked sooner.  Performed at [facility], [place] [address], [place], [place] [address]      Labs: Basic Metabolic Panel: Recent Labs  Lab 01/08/20 1129 01/09/20 0355  NA 141 138  K 3.3* 3.9  CL 105 101  CO2 28 27  GLUCOSE 70 131*  BUN 10 16  CREATININE 0.71 0.64  CALCIUM 9.2 9.0   Liver Function Tests: Recent Labs  Lab 01/08/20 1129 01/09/20 0355  AST 22 14*  ALT 15 15  ALKPHOS 54 55  BILITOT 0.5 0.7  PROT 7.5 6.6  ALBUMIN 4.4 3.8   No results for input(s): LIPASE, AMYLASE in the last 168 hours. No results for input(s): AMMONIA in the last 168 hours. CBC: Recent Labs  Lab 01/08/20 1129 01/09/20 0355  WBC 10.4 7.7  NEUTROABS 6.3  --   HGB 12.4 11.4*  HCT 39.4 36.2  MCV 94.5 92.8  PLT 271 245   Cardiac Enzymes: No results for input(s): CKTOTAL, CKMB, CKMBINDEX, TROPONINI in the last 168 hours. BNP: BNP (last 3 results) Recent Labs    01/08/20 1129  BNP 85.3    ProBNP (last 3 results) No results for  input(s): PROBNP in the last 8760 hours.  CBG: No results for input(s): GLUCAP in the last 168 hours.     Signed:  Nita Sells MD   [facility] 01/09/2020, 10:38 AM

## 2020-01-09 NOTE — ED Notes (Signed)
Patient eating breakfast. °

## 2020-01-23 ENCOUNTER — Other Ambulatory Visit: Payer: Self-pay | Admitting: Cardiology

## 2020-02-06 ENCOUNTER — Telehealth: Payer: Self-pay | Admitting: Pulmonary Disease

## 2020-02-06 MED ORDER — ALBUTEROL SULFATE HFA 108 (90 BASE) MCG/ACT IN AERS: 2.0000 | INHALATION_SPRAY | RESPIRATORY_TRACT | 2 refills | Status: DC | PRN

## 2020-02-06 NOTE — Telephone Encounter (Signed)
rx refills for proair have been sent  Spoke with the pt and notified that this was done  Nothing further needed

## 2020-02-10 ENCOUNTER — Other Ambulatory Visit: Payer: Self-pay | Admitting: Cardiology

## 2020-02-10 ENCOUNTER — Other Ambulatory Visit: Payer: Self-pay | Admitting: Pulmonary Disease

## 2020-02-10 NOTE — Telephone Encounter (Signed)
Clopidogrel 75 mg # 90 x 1 refill to [facility] Request for Famotidine denied message to pharm to contact pcp

## 2020-02-11 ENCOUNTER — Ambulatory Visit: Payer: BC Managed Care – PPO | Admitting: Pulmonary Disease

## 2020-02-18 ENCOUNTER — Other Ambulatory Visit: Payer: Self-pay | Admitting: Cardiology

## 2020-02-21 ENCOUNTER — Other Ambulatory Visit: Payer: Self-pay

## 2020-02-21 NOTE — Telephone Encounter (Signed)
Refill denied for Amlodipine due to it being filled on 02-18-20 for a 30 day supply.  Patient needs an appointment for a 90 day supply.

## 2020-02-27 ENCOUNTER — Other Ambulatory Visit: Payer: Self-pay | Admitting: Cardiology

## 2020-03-04 ENCOUNTER — Other Ambulatory Visit: Payer: Self-pay | Admitting: Cardiology

## 2020-04-03 ENCOUNTER — Ambulatory Visit (INDEPENDENT_AMBULATORY_CARE_PROVIDER_SITE_OTHER)
Admission: RE | Admit: 2020-04-03 | Discharge: 2020-04-03 | Disposition: A | Discharge status: Home / Self Care | Payer: BC Managed Care – PPO | Source: Ambulatory Visit | Attending: Internal Medicine | Admitting: Internal Medicine

## 2020-04-03 ENCOUNTER — Other Ambulatory Visit: Payer: Self-pay

## 2020-04-03 DIAGNOSIS — Z87891 Personal history of nicotine dependence: Secondary | ICD-10-CM

## 2020-04-16 ENCOUNTER — Telehealth: Payer: Self-pay | Admitting: Acute Care

## 2020-04-16 DIAGNOSIS — Z87891 Personal history of nicotine dependence: Secondary | ICD-10-CM

## 2020-04-16 NOTE — Telephone Encounter (Signed)
Left detailed message (per DPR) for pt that we are working with [facility] to get the results of her low dose Chest Ct. I advised that we should received the results by the end of the day and Eric Form, NP will be able to call her with those results.

## 2020-04-17 NOTE — Telephone Encounter (Signed)
Have called the patient with her LDCT results. Her scan was read as a Lung RADS 2: nodules that are benign in appearance and behavior with a very low likelihood of becoming a clinically active cancer due to size or lack of growth. Recommendation per radiology is for a repeat LDCT in 12 months.Please schedule for annual screening scan 03/2021, and fax results to PCP. Langley Gauss, thanks for your help with this.
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
## 2020-04-19 ENCOUNTER — Other Ambulatory Visit: Payer: Self-pay | Admitting: Cardiology

## 2020-04-20 NOTE — Addendum Note (Signed)
Addended by: Doroteo Glassman D on: 04/20/2020 09:17 AM   Modules accepted: Orders

## 2020-04-20 NOTE — Telephone Encounter (Signed)
Order placed for 1 year f/u low dose ct. Results faxed to PCP. Nothing further needed at this time.

## 2020-04-24 ENCOUNTER — Encounter: Payer: Self-pay | Admitting: Obstetrics and Gynecology

## 2020-04-24 ENCOUNTER — Other Ambulatory Visit: Payer: Self-pay

## 2020-04-24 ENCOUNTER — Ambulatory Visit (INDEPENDENT_AMBULATORY_CARE_PROVIDER_SITE_OTHER): Payer: BC Managed Care – PPO | Admitting: Obstetrics and Gynecology

## 2020-04-24 VITALS — BP 123/76 | HR 90 | Ht 63.0 in | Wt 181.0 lb

## 2020-04-24 DIAGNOSIS — N812 Incomplete uterovaginal prolapse: Secondary | ICD-10-CM | POA: Diagnosis not present

## 2020-04-24 DIAGNOSIS — N8110 Cystocele, unspecified: Secondary | ICD-10-CM

## 2020-04-24 DIAGNOSIS — R351 Nocturia: Secondary | ICD-10-CM

## 2020-04-24 LAB — POCT URINALYSIS DIPSTICK
Appearance: NORMAL
Bilirubin, UA: NEGATIVE
Blood, UA: NEGATIVE
Glucose, UA: NEGATIVE
Ketones, UA: NEGATIVE
Nitrite, UA: NEGATIVE
Protein, UA: NEGATIVE
Spec Grav, UA: 1.025 (ref 1.010–1.025)
Urobilinogen, UA: NEGATIVE E.U./dL — AB
pH, UA: 5 (ref 5.0–8.0)

## 2020-04-24 NOTE — Patient Instructions (Signed)
You have a stage 2 (out of 4) prolapse.  We discussed the fact that it is not life threatening but there are several treatment options. For treatment of pelvic organ prolapse, we discussed options for management including expectant management, conservative management, and surgical management, such as Kegels, a pessary, pelvic floor physical therapy, and specific surgical procedures.    You have decided to try a pessary. You will return for a fitting once the pessaries are in stock.

## 2020-04-30 NOTE — Progress Notes (Signed)
Please call patient and let them  know their  low dose Ct was read as a Lung RADS 2: nodules that are benign in appearance and behavior with a very low likelihood of becoming a clinically active cancer due to size or lack of growth. Recommendation per radiology is for a repeat LDCT in 12 months. .Please let them  know we will order and schedule their  annual screening scan for 04/2021. Please let them  know there was notation of CAD on their  scan.  Please remind the patient  that this is a non-gated exam therefore degree or severity of disease  cannot be determined. Please have them  follow up with their PCP regarding potential risk factor modification, dietary therapy or pharmacologic therapy if clinically indicated. Pt.  is not currently on statin therapy. Please place order for annual  screening scan for  04/2021 and fax results to PCP. Thanks so much.
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
## 2020-05-12 ENCOUNTER — Ambulatory Visit (INDEPENDENT_AMBULATORY_CARE_PROVIDER_SITE_OTHER): Payer: BC Managed Care – PPO | Admitting: Obstetrics and Gynecology

## 2020-05-12 ENCOUNTER — Encounter: Payer: Self-pay | Admitting: Obstetrics and Gynecology

## 2020-05-12 ENCOUNTER — Other Ambulatory Visit: Payer: Self-pay

## 2020-05-12 VITALS — BP 114/79 | HR 79 | Ht 63.0 in | Wt 182.0 lb

## 2020-05-12 DIAGNOSIS — N8110 Cystocele, unspecified: Secondary | ICD-10-CM | POA: Diagnosis not present

## 2020-05-12 NOTE — Patient Instructions (Signed)
You were fitted with a ring with support pessary.  Come back in 4 weeks to see how it is working.  You can take it out every week, or sooner if you desire. Clean with liquid soap and water in between uses and leave out over night on the night that you remove it. Call for any problems.  

## 2020-05-15 ENCOUNTER — Other Ambulatory Visit: Payer: Self-pay | Admitting: Cardiology

## 2020-05-25 ENCOUNTER — Telehealth: Payer: Self-pay | Admitting: Pulmonary Disease

## 2020-05-25 MED ORDER — AZITHROMYCIN 250 MG PO TABS: 250.0000 mg | ORAL_TABLET | ORAL | 0 refills | Status: DC

## 2020-05-25 MED ORDER — PREDNISONE 10 MG PO TABS: ORAL_TABLET | ORAL | 0 refills | Status: DC

## 2020-05-25 NOTE — Telephone Encounter (Signed)
Spoke with pt and notified of response per Dr [name]  She does report coughing up yellow sputum so zpack was sent as well  Pt to call if not improving

## 2020-05-25 NOTE — Telephone Encounter (Signed)
Primary Pulmonologist: Mannam Last office visit and with whom: 01/03/20 with Tammy Parrett What do we see them for (pulmonary problems): COPD Last OV assessment/plan:  Patient Instructions  Augmentin 875mg  Twice daily For 7days  Prednisone taper over next week  Mucinex DM Twice daily As needed Cough/congestion  Restart BREZTRI 2 puffs Twice daily, rinse after use  Albuterol inhaler or neb As needed  Follow up with Dr. [name] in 4 weeks and As needed  Please contact office for sooner follow up if symptoms do not improve or worsen or seek emergency care    Was appointment offered to patient (explain)?     Reason for call: Patient called wheezing that started yesterday, states she has been taking her albuterol nebulizer 3 times a day and her albuterol inhaler 2 puffs in the morning and 2 puffs in the afternoon. Patient on 3L on oxygen, Takes Breztri as prescribed. She does not have a meter to measure her oxygen levels. I told her she will need to go to seek emergency if she worsens.   Dr. [name] please advise  Allergies  Allergen Reactions  . Amoxicillin Shortness Of Breath  . Tylenol [Acetaminophen] Shortness Of Breath    Immunization History  Administered Date(s) Administered  . Influenza Inj Mdck Quad Pf 01/24/2017  . Influenza Split 02/09/2012, 02/14/2015  . Influenza Whole 12/24/2017  . Influenza,inj,Quad PF,6+ Mos 01/14/2017  . Influenza-Unspecified 05/16/2012, 12/31/2018  . PFIZER SARS-COV-2 Vaccination 08/19/2019, 09/13/2019  . Pneumococcal Polysaccharide-23 09/11/2011, 04/15/2012

## 2020-05-25 NOTE — Telephone Encounter (Signed)
Send in prednisone taper starting at 60 mg.  Reduce dose by 10 mg every 2 days. If she has purulent sputum then prescribe a Z-Pak as well
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
## 2020-06-08 ENCOUNTER — Other Ambulatory Visit: Payer: Self-pay | Admitting: Cardiology

## 2020-06-09 ENCOUNTER — Encounter: Payer: Self-pay | Admitting: Obstetrics and Gynecology

## 2020-06-09 ENCOUNTER — Ambulatory Visit (INDEPENDENT_AMBULATORY_CARE_PROVIDER_SITE_OTHER): Payer: BC Managed Care – PPO | Admitting: Obstetrics and Gynecology

## 2020-06-09 VITALS — BP 111/74 | HR 80 | Ht 63.0 in | Wt 187.0 lb

## 2020-06-09 DIAGNOSIS — N8110 Cystocele, unspecified: Secondary | ICD-10-CM | POA: Diagnosis not present

## 2020-06-09 DIAGNOSIS — S329XXA Fracture of unspecified parts of lumbosacral spine and pelvis, initial encounter for closed fracture: Secondary | ICD-10-CM | POA: Insufficient documentation

## 2020-06-09 DIAGNOSIS — S82009A Unspecified fracture of unspecified patella, initial encounter for closed fracture: Secondary | ICD-10-CM | POA: Insufficient documentation

## 2020-06-09 DIAGNOSIS — N812 Incomplete uterovaginal prolapse: Secondary | ICD-10-CM

## 2020-06-09 NOTE — Telephone Encounter (Signed)
Refill sent to pharmacy.   

## 2020-06-09 NOTE — Patient Instructions (Signed)
We changed the pessary to a larger size but still a ring with support. You should still remove it weekly. Call with any problems.

## 2020-06-17 ENCOUNTER — Other Ambulatory Visit: Payer: Self-pay

## 2020-06-17 ENCOUNTER — Ambulatory Visit: Payer: BC Managed Care – PPO | Admitting: Cardiology

## 2020-06-17 ENCOUNTER — Encounter: Payer: Self-pay | Admitting: Cardiology

## 2020-06-17 VITALS — BP 108/72 | HR 86 | Ht 63.0 in | Wt 184.0 lb

## 2020-06-17 DIAGNOSIS — G720 Drug-induced myopathy: Secondary | ICD-10-CM

## 2020-06-17 DIAGNOSIS — T466X5A Adverse effect of antihyperlipidemic and antiarteriosclerotic drugs, initial encounter: Secondary | ICD-10-CM

## 2020-06-17 DIAGNOSIS — I2510 Atherosclerotic heart disease of native coronary artery without angina pectoris: Secondary | ICD-10-CM

## 2020-06-17 DIAGNOSIS — J449 Chronic obstructive pulmonary disease, unspecified: Secondary | ICD-10-CM

## 2020-06-17 DIAGNOSIS — R079 Chest pain, unspecified: Secondary | ICD-10-CM | POA: Diagnosis not present

## 2020-06-17 DIAGNOSIS — E7800 Pure hypercholesterolemia, unspecified: Secondary | ICD-10-CM | POA: Diagnosis not present

## 2020-06-17 MED ORDER — METOPROLOL TARTRATE 100 MG PO TABS: 100.0000 mg | ORAL_TABLET | Freq: Once | ORAL | 0 refills | Status: DC

## 2020-06-17 MED ORDER — EZETIMIBE 10 MG PO TABS: 10.0000 mg | ORAL_TABLET | Freq: Every day | ORAL | 3 refills | Status: DC

## 2020-06-17 MED ORDER — PREDNISONE 20 MG PO TABS: ORAL_TABLET | ORAL | 0 refills | Status: DC

## 2020-06-17 MED ORDER — DILTIAZEM HCL ER COATED BEADS 240 MG PO CP24: 240.0000 mg | ORAL_CAPSULE | Freq: Every day | ORAL | 3 refills | Status: DC
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
## 2020-06-17 NOTE — Patient Instructions (Signed)
Medication Instructions:  Your physician has recommended you make the following change in your medication:  START: Zetia 10 mg take one tablet by mouth daily.  START: Cardizem 240 mg take one tablet by mouth daily.  START: Prednisone 40 mg take two tablets by mouth the day before your cardiac CT and the day after your cardiac CT.  STOP: Amlodipine  *If you need a refill on your cardiac medications before your next appointment, please call your pharmacy*   Lab Work: Your physician recommends that you return for lab work in: Within one week of your cardiac CT  BMP  If you have labs (blood work) drawn today and your tests are completely normal, you will receive your results only by: Marland Kitchen MyChart Message (if you have MyChart) OR . A paper copy in the mail If you have any lab test that is abnormal or we need to change your treatment, we will call you to review the results.   Testing/Procedures: Your cardiac CT will be scheduled at the below location:   [facility] [address] [place], [place] [address] [phone]  If scheduled at [facility], please arrive at the [facility] main entrance of [facility] 30 minutes prior to test start time. Proceed to the [facility] Radiology Department (first floor) to check-in and test prep.  Please follow these instructions carefully (unless otherwise directed):  On the Night Before the Test: . Be sure to Drink plenty of water. . Do not consume any caffeinated/decaffeinated beverages or chocolate 12 hours prior to your test. . Do not take any antihistamines 12 hours prior to your test.  On the Day of the Test: . Drink plenty of water. Do not drink any water within one hour of the test. . Do not eat any food 4 hours prior to the test. . You may take your regular medications prior to the test.  . Take metoprolol (Lopressor) two hours prior to test. . FEMALES- please wear underwire-free bra if available        After the Test: . Drink plenty of water. . After receiving IV contrast, you may experience a mild flushed feeling. This is normal. . On occasion, you may experience a mild rash up to 24 hours after the test. This is not dangerous. If this occurs, you can take Benadryl 25 mg and increase your fluid intake. . If you experience trouble breathing, this can be serious. If it is severe call 911 IMMEDIATELY. If it is mild, please call our office. . If you take any of these medications: Glipizide/Metformin, Avandament, Glucavance, please do not take 48 hours after completing test unless otherwise instructed.   Once we have confirmed authorization from your insurance company, we will call you to set up a date and time for your test. Based on how quickly your insurance processes prior authorizations requests, please allow up to 4 weeks to be contacted for scheduling your Cardiac CT appointment. Be advised that routine Cardiac CT appointments could be scheduled as many as 8 weeks after your provider has ordered it.  For non-scheduling related questions, please contact the cardiac imaging nurse navigator should you have any questions/concerns: Marchia Bond, Cardiac Imaging Nurse Navigator Burley Saver, Interim Cardiac Imaging Nurse [place] [facility] Direct Office Dial: [phone]   For scheduling needs, including cancellations and rescheduling, please call [place], [phone].     Follow-Up: At [facility], you and your health needs are our priority.  As part of our continuing mission to provide you with exceptional heart care, we have created designated Provider Care Teams.  These Care Teams include your primary Cardiologist (physician) and Advanced Practice Providers (APPs -  Physician Assistants and Nurse Practitioners) who all work together to provide you with the care you need, when you need it.  We recommend signing up for the patient portal called "MyChart".   Sign up information is provided on this After Visit Summary.  MyChart is used to connect with patients for Virtual Visits (Telemedicine).  Patients are able to view lab/test results, encounter notes, upcoming appointments, etc.  Non-urgent messages can be sent to your provider as well.   To learn more about what you can do with MyChart, go to [email or web address].    Your next appointment:   6 week(s)  The format for your next appointment:   In Person  Provider:   Shirlee More, MD   Other Instructions

## 2020-06-25 ENCOUNTER — Other Ambulatory Visit: Payer: Self-pay | Admitting: Cardiology

## 2020-06-26 LAB — BASIC METABOLIC PANEL
BUN/Creatinine Ratio: 8 — ABNORMAL LOW (ref 12–28)
BUN: 6 mg/dL — ABNORMAL LOW (ref 8–27)
CO2: 25 mmol/L (ref 20–29)
Calcium: 9.3 mg/dL (ref 8.7–10.3)
Chloride: 101 mmol/L (ref 96–106)
Creatinine, Ser: 0.71 mg/dL (ref 0.57–1.00)
GFR calc Af Amer: 106 mL/min/{1.73_m2} (ref >59)
GFR calc non Af Amer: 92 mL/min/{1.73_m2} (ref >59)
Glucose: 93 mg/dL (ref 65–99)
Potassium: 4.3 mmol/L (ref 3.5–5.2)
Sodium: 141 mmol/L (ref 134–144)

## 2020-06-29 ENCOUNTER — Telehealth (HOSPITAL_COMMUNITY): Payer: Self-pay | Admitting: *Deleted

## 2020-06-29 ENCOUNTER — Telehealth: Payer: Self-pay

## 2020-06-29 NOTE — Telephone Encounter (Signed)
Reaching out to patient to offer assistance regarding upcoming cardiac imaging study; pt verbalizes understanding of appt date/time, parking situation and where to check in, pre-test NPO status and medications ordered, and verified current allergies; name and call back number provided for further questions should they arise  Ayce Pietrzyk RN Navigator Cardiac Imaging [facility] [phone] office [phone] cell  

## 2020-06-29 NOTE — Telephone Encounter (Signed)
Patient notified of results and verbalized understanding.  

## 2020-06-29 NOTE — Telephone Encounter (Signed)
-----   Message from Richardo Priest, MD sent at 06/29/2020 11:00 AM EST ----- BMP is normal

## 2020-07-01 ENCOUNTER — Encounter (HOSPITAL_COMMUNITY): Payer: Self-pay

## 2020-07-01 ENCOUNTER — Other Ambulatory Visit: Payer: Self-pay

## 2020-07-01 ENCOUNTER — Ambulatory Visit (HOSPITAL_COMMUNITY)
Admission: RE | Admit: 2020-07-01 | Discharge: 2020-07-01 | Disposition: A | Discharge status: Home / Self Care | Payer: BC Managed Care – PPO | Source: Ambulatory Visit | Attending: Cardiology | Admitting: Cardiology

## 2020-07-01 DIAGNOSIS — R079 Chest pain, unspecified: Secondary | ICD-10-CM | POA: Insufficient documentation

## 2020-07-01 DIAGNOSIS — Z006 Encounter for examination for normal comparison and control in clinical research program: Secondary | ICD-10-CM

## 2020-07-01 MED ORDER — NITROGLYCERIN 0.4 MG SL SUBL
SUBLINGUAL_TABLET | SUBLINGUAL | Status: AC
  Filled 2020-07-01: qty 2

## 2020-07-01 MED ORDER — IOHEXOL 350 MG/ML SOLN
80.0000 mL | Freq: Once | INTRAVENOUS | Status: AC | PRN
  Administered 2020-07-01: 80 mL via INTRAVENOUS

## 2020-07-01 MED ORDER — NITROGLYCERIN 0.4 MG SL SUBL
0.8000 mg | SUBLINGUAL_TABLET | Freq: Once | SUBLINGUAL | Status: AC
  Administered 2020-07-01: 0.8 mg via SUBLINGUAL

## 2020-07-01 NOTE — Research (Signed)
IDENTIFY Informed Consent                  Subject Name: [name]    Subject met inclusion and exclusion criteria.  The informed consent form, study requirements and expectations were reviewed with the subject and questions and concerns were addressed prior to the signing of the consent form.  The subject verbalized understanding of the trial requirements.  The subject agreed to participate in the IDENTIFY trial and signed the informed consent at 15:17PM on 07/01/20.  The informed consent was obtained prior to performance of any protocol-specific procedures for the subject.  A copy of the signed informed consent was given to the subject and a copy was placed in the subject's medical record.   Meade Maw, Naval architect

## 2020-07-14 ENCOUNTER — Other Ambulatory Visit: Payer: Self-pay | Admitting: Pulmonary Disease
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
## 2020-08-04 ENCOUNTER — Other Ambulatory Visit: Payer: Self-pay

## 2020-08-04 ENCOUNTER — Encounter: Payer: Self-pay | Admitting: Cardiology

## 2020-08-04 ENCOUNTER — Ambulatory Visit: Payer: BC Managed Care – PPO | Admitting: Cardiology

## 2020-08-04 VITALS — BP 118/74 | HR 80 | Ht 63.0 in | Wt 181.1 lb

## 2020-08-04 DIAGNOSIS — R931 Abnormal findings on diagnostic imaging of heart and coronary circulation: Secondary | ICD-10-CM | POA: Diagnosis not present

## 2020-08-04 DIAGNOSIS — J449 Chronic obstructive pulmonary disease, unspecified: Secondary | ICD-10-CM

## 2020-08-04 DIAGNOSIS — I2510 Atherosclerotic heart disease of native coronary artery without angina pectoris: Secondary | ICD-10-CM

## 2020-08-04 DIAGNOSIS — E7800 Pure hypercholesterolemia, unspecified: Secondary | ICD-10-CM | POA: Diagnosis not present

## 2020-08-04 NOTE — Patient Instructions (Signed)
Medication Instructions:  Your physician has recommended you make the following change in your medication:  STOP: Plavix *If you need a refill on your cardiac medications before your next appointment, please call your pharmacy*   Lab Work: Your physician recommends that you return for lab work in: [place] If you have labs (blood work) drawn today and your tests are completely normal, you will receive your results only by: Marland Kitchen MyChart Message (if you have MyChart) OR . A paper copy in the mail If you have any lab test that is abnormal or we need to change your treatment, we will call you to review the results.   Testing/Procedures: None   Follow-Up: At [facility], you and your health needs are our priority.  As part of our continuing mission to provide you with exceptional heart care, we have created designated Provider Care Teams.  These Care Teams include your primary Cardiologist (physician) and Advanced Practice Providers (APPs -  Physician Assistants and Nurse Practitioners) who all work together to provide you with the care you need, when you need it.  We recommend signing up for the patient portal called "MyChart".  Sign up information is provided on this After Visit Summary.  MyChart is used to connect with patients for Virtual Visits (Telemedicine).  Patients are able to view lab/test results, encounter notes, upcoming appointments, etc.  Non-urgent messages can be sent to your provider as well.   To learn more about what you can do with MyChart, go to [email or web address].    Your next appointment:   1 year(s)  The format for your next appointment:   In Person  Provider:   Shirlee More, MD   Other Instructions

## 2020-08-05 ENCOUNTER — Telehealth: Payer: Self-pay

## 2020-08-05 LAB — LIPID PANEL
Chol/HDL Ratio: 2.5 ratio (ref 0.0–4.4)
Cholesterol, Total: 190 mg/dL (ref 100–199)
HDL: 75 mg/dL (ref >39)
LDL Chol Calc (NIH): 101 mg/dL — ABNORMAL HIGH (ref 0–99)
Triglycerides: 77 mg/dL (ref 0–149)
VLDL Cholesterol Cal: 14 mg/dL (ref 5–40)

## 2020-08-05 NOTE — Telephone Encounter (Signed)
-----   Message from Richardo Priest, MD sent at 08/05/2020 11:20 AM EDT ----- Good/stable result She is statin intolerant Continue Zetia.

## 2020-08-05 NOTE — Telephone Encounter (Signed)
Spoke with patient regarding results and recommendation.  Patient verbalizes understanding and is agreeable to plan of care. Advised patient to call back with any issues or concerns.  

## 2020-08-12 ENCOUNTER — Other Ambulatory Visit: Payer: Self-pay | Admitting: *Deleted

## 2020-08-12 MED ORDER — MONTELUKAST SODIUM 10 MG PO TABS
10.0000 mg | ORAL_TABLET | Freq: Every day | ORAL | 1 refills | Status: DC
Start: 2020-08-12 — End: 2021-09-28

## 2020-08-14 ENCOUNTER — Other Ambulatory Visit: Payer: Self-pay | Admitting: Pulmonary Disease

## 2020-08-17 ENCOUNTER — Telehealth: Payer: Self-pay | Admitting: Pulmonary Disease

## 2020-08-17 ENCOUNTER — Other Ambulatory Visit: Payer: Self-pay | Admitting: Pulmonary Disease

## 2020-08-17 MED ORDER — BEVESPI AEROSPHERE 9-4.8 MCG/ACT IN AERO: 2.0000 | INHALATION_SPRAY | Freq: Two times a day (BID) | RESPIRATORY_TRACT | 6 refills | Status: DC

## 2020-08-17 MED ORDER — BREZTRI AEROSPHERE 160-9-4.8 MCG/ACT IN AERO: INHALATION_SPRAY | RESPIRATORY_TRACT | 2 refills | Status: DC

## 2020-08-17 NOTE — Telephone Encounter (Signed)
Called and spoke with Patient. Patient stated she was told her insurance will no longer cover Breztri and the cost is $900 per month.  Advised Patient to contact her insurance company for preferred inhaler list and to call back, so we can give Dr. [name] a list of preferred inhalers. Understanding stated. Nothing further at this time.

## 2020-08-17 NOTE — Telephone Encounter (Signed)
Ok to send in order for Bevespi 2 inhalations twice daily DC Breztri order

## 2020-08-17 NOTE — Telephone Encounter (Signed)
Called and spoke with Patient. Patient stated she spoke with her  insurance and  Charolotte Eke is covered. Patient request a Bevespi prescription to be sent to [facility].  Message routed to Dr [name]

## 2020-08-17 NOTE — Telephone Encounter (Signed)
Patient called her insurance and they told her Charolotte Eke is covered.   Sending to Dr. [name] for recommendations on changing to [facility]

## 2020-08-17 NOTE — Telephone Encounter (Signed)
bevespi has been sent to the pharmacy per pts request.  Nothing further is needed.

## 2020-08-17 NOTE — Telephone Encounter (Signed)
Patient states [name] is covered by insurance. Pharmacy is [facility] [address]. Patient phone number is [phone].

## 2020-08-19 ENCOUNTER — Telehealth: Payer: Self-pay | Admitting: Cardiology

## 2020-08-19 ENCOUNTER — Other Ambulatory Visit: Payer: Self-pay | Admitting: Cardiology

## 2020-08-19 MED ORDER — REPATHA SURECLICK 140 MG/ML ~~LOC~~ SOAJ: 1.0000 mL | SUBCUTANEOUS | 3 refills | Status: DC

## 2020-08-19 NOTE — Telephone Encounter (Signed)
   Pt c/o medication issue:  1. Name of Medication:   ezetimibe (ZETIA) 10 MG tablet    2. How are you currently taking this medication (dosage and times per day)? Take 1 tablet (10 mg total) by mouth daily.  3. Are you having a reaction (difficulty breathing--STAT)?   4. What is your medication issue? Pt said she is having side effects, its getting bad that she cant do her job. She said her shoulder sore and diarrhea. She wanted to know if Dr. Bettina Gavia can prescribed her something else

## 2020-08-19 NOTE — Telephone Encounter (Signed)
Spoke to the patient just now and let her know that we are working on getting this prior authorization completed.

## 2020-08-19 NOTE — Telephone Encounter (Signed)
    Pt is calling back about Repatha, she said her insurance will not cover it and wasn't sure if Lilia Pro told her she will help her to submit a pt assistance request

## 2020-08-19 NOTE — Telephone Encounter (Signed)
Spoke to the patient just now and let her know Dr. Joya Gaskins recommendations. She verbalizes understanding and thanks me for calling her back.

## 2020-08-19 NOTE — Telephone Encounter (Signed)
Yes, please add allergies for her both Zetia and statins  Start Repatha 140 mg every 2 weeks

## 2020-08-19 NOTE — Telephone Encounter (Signed)
I sent a pa for praluent instead.... 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 Will route to morgan phillips to make her aware. And I will take over and do the follow ups for her

## 2020-08-20 ENCOUNTER — Telehealth: Payer: Self-pay

## 2020-08-20 DIAGNOSIS — E7800 Pure hypercholesterolemia, unspecified: Secondary | ICD-10-CM

## 2020-08-20 DIAGNOSIS — R079 Chest pain, unspecified: Secondary | ICD-10-CM

## 2020-08-20 MED ORDER — PRALUENT 150 MG/ML ~~LOC~~ SOAJ: 150.0000 mg | SUBCUTANEOUS | 11 refills | Status: DC

## 2020-08-20 NOTE — Telephone Encounter (Signed)
Called and spoke w/pt regarding the approval of the praluent 150mg , rx sent, copay card emailed to pt, instructed the pt to complete fasting labs post fourth dose,pt voiced understanding, labs were ordered
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
## 2020-09-11 ENCOUNTER — Ambulatory Visit (INDEPENDENT_AMBULATORY_CARE_PROVIDER_SITE_OTHER): Payer: BC Managed Care – PPO | Admitting: Obstetrics and Gynecology

## 2020-09-11 ENCOUNTER — Other Ambulatory Visit: Payer: Self-pay

## 2020-09-11 ENCOUNTER — Encounter: Payer: Self-pay | Admitting: Obstetrics and Gynecology

## 2020-09-11 VITALS — BP 122/77 | HR 103 | Ht 63.0 in | Wt 181.0 lb

## 2020-09-11 DIAGNOSIS — N812 Incomplete uterovaginal prolapse: Secondary | ICD-10-CM | POA: Diagnosis not present

## 2020-09-11 DIAGNOSIS — N8110 Cystocele, unspecified: Secondary | ICD-10-CM | POA: Diagnosis not present

## 2020-09-14 ENCOUNTER — Ambulatory Visit: Payer: BC Managed Care – PPO | Admitting: Obstetrics and Gynecology

## 2020-09-30 ENCOUNTER — Telehealth (HOSPITAL_BASED_OUTPATIENT_CLINIC_OR_DEPARTMENT_OTHER): Payer: Self-pay

## 2020-09-30 NOTE — Telephone Encounter (Signed)
[name] is a 60 y.o. female complains of vaginal pain.  Pt saw Dr Wannetta Sender on 09/11/20 for a pessary check.  Pt said she has been experiencing right side vaginal pain and is requesting an appt because she cannot work being in pain. Pt was booked with Dr. Sabra Heck on 10/01/20 @ 2:30pm. Pt advised to arrive 15 min early.

## 2020-10-01 ENCOUNTER — Ambulatory Visit (INDEPENDENT_AMBULATORY_CARE_PROVIDER_SITE_OTHER): Payer: BC Managed Care – PPO | Admitting: Obstetrics & Gynecology

## 2020-10-01 ENCOUNTER — Other Ambulatory Visit (HOSPITAL_COMMUNITY)
Admission: RE | Admit: 2020-10-01 | Discharge: 2020-10-01 | Disposition: A | Discharge status: Home / Self Care | Payer: BC Managed Care – PPO | Source: Ambulatory Visit | Attending: Obstetrics & Gynecology | Admitting: Obstetrics & Gynecology

## 2020-10-01 ENCOUNTER — Other Ambulatory Visit: Payer: Self-pay

## 2020-10-01 ENCOUNTER — Encounter (HOSPITAL_BASED_OUTPATIENT_CLINIC_OR_DEPARTMENT_OTHER): Payer: Self-pay | Admitting: Obstetrics & Gynecology

## 2020-10-01 ENCOUNTER — Other Ambulatory Visit (HOSPITAL_BASED_OUTPATIENT_CLINIC_OR_DEPARTMENT_OTHER)
Admission: RE | Admit: 2020-10-01 | Discharge: 2020-10-01 | Disposition: A | Discharge status: Home / Self Care | Payer: BC Managed Care – PPO | Source: Ambulatory Visit | Attending: Obstetrics & Gynecology | Admitting: Obstetrics & Gynecology

## 2020-10-01 VITALS — BP 145/92 | HR 89 | Wt 176.0 lb

## 2020-10-01 DIAGNOSIS — N816 Rectocele: Secondary | ICD-10-CM

## 2020-10-01 DIAGNOSIS — F1911 Other psychoactive substance abuse, in remission: Secondary | ICD-10-CM

## 2020-10-01 DIAGNOSIS — N9089 Other specified noninflammatory disorders of vulva and perineum: Secondary | ICD-10-CM | POA: Diagnosis not present

## 2020-10-01 DIAGNOSIS — Z113 Encounter for screening for infections with a predominantly sexual mode of transmission: Secondary | ICD-10-CM

## 2020-10-01 LAB — HEPATITIS B SURFACE ANTIGEN: Hepatitis B Surface Ag: NONREACTIVE

## 2020-10-01 LAB — HEPATITIS C ANTIBODY: HCV Ab: REACTIVE — AB

## 2020-10-01 LAB — HIV ANTIBODY (ROUTINE TESTING W REFLEX): HIV Screen 4th Generation wRfx: NONREACTIVE

## 2020-10-01 MED ORDER — VALACYCLOVIR HCL 1 G PO TABS: 1000.0000 mg | ORAL_TABLET | Freq: Two times a day (BID) | ORAL | 0 refills | Status: DC
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
## 2020-10-02 LAB — CERVICOVAGINAL ANCILLARY ONLY
Chlamydia: NEGATIVE
Comment: NEGATIVE
Comment: NEGATIVE
Comment: NORMAL
Neisseria Gonorrhea: NEGATIVE
Trichomonas: NEGATIVE

## 2020-10-02 LAB — RPR: RPR Ser Ql: NONREACTIVE

## 2020-10-03 ENCOUNTER — Encounter (HOSPITAL_BASED_OUTPATIENT_CLINIC_OR_DEPARTMENT_OTHER): Payer: Self-pay | Admitting: Obstetrics & Gynecology

## 2020-10-03 DIAGNOSIS — F1911 Other psychoactive substance abuse, in remission: Secondary | ICD-10-CM | POA: Insufficient documentation

## 2020-10-03 DIAGNOSIS — R7303 Prediabetes: Secondary | ICD-10-CM | POA: Insufficient documentation

## 2020-10-03 DIAGNOSIS — D259 Leiomyoma of uterus, unspecified: Secondary | ICD-10-CM | POA: Insufficient documentation

## 2020-10-03 DIAGNOSIS — Z87891 Personal history of nicotine dependence: Secondary | ICD-10-CM

## 2020-10-03 HISTORY — DX: Leiomyoma of uterus, unspecified: D25.9

## 2020-10-03 HISTORY — DX: Other psychoactive substance abuse, in remission: F19.11

## 2020-10-03 HISTORY — DX: Prediabetes: R73.03

## 2020-10-03 HISTORY — DX: Personal history of nicotine dependence: Z87.891

## 2020-10-06 ENCOUNTER — Other Ambulatory Visit (HOSPITAL_COMMUNITY)
Admission: RE | Admit: 2020-10-06 | Discharge: 2020-10-06 | Disposition: A | Discharge status: Home / Self Care | Payer: BC Managed Care – PPO | Source: Ambulatory Visit | Attending: Obstetrics & Gynecology | Admitting: Obstetrics & Gynecology

## 2020-10-06 ENCOUNTER — Other Ambulatory Visit: Payer: Self-pay | Admitting: *Deleted

## 2020-10-06 DIAGNOSIS — N9089 Other specified noninflammatory disorders of vulva and perineum: Secondary | ICD-10-CM | POA: Diagnosis not present

## 2020-10-06 DIAGNOSIS — Z113 Encounter for screening for infections with a predominantly sexual mode of transmission: Secondary | ICD-10-CM | POA: Diagnosis present

## 2020-10-06 DIAGNOSIS — J438 Other emphysema: Secondary | ICD-10-CM

## 2020-10-06 LAB — CERVICOVAGINAL ANCILLARY ONLY
Comment: NEGATIVE
HSV1: NEGATIVE
HSV2: NEGATIVE

## 2020-10-06 MED ORDER — ALBUTEROL SULFATE (2.5 MG/3ML) 0.083% IN NEBU: 2.5000 mg | INHALATION_SOLUTION | Freq: Four times a day (QID) | RESPIRATORY_TRACT | 11 refills | Status: DC

## 2020-10-06 NOTE — Addendum Note (Signed)
Addended by: Blenda Nicely on: 10/06/2020 10:35 AM   Modules accepted: Orders

## 2020-10-06 NOTE — Addendum Note (Signed)
Addended by: Blenda Nicely on: 10/06/2020 10:33 AM   Modules accepted: Orders

## 2020-10-16 ENCOUNTER — Other Ambulatory Visit: Payer: Self-pay | Admitting: Cardiology

## 2020-10-16 NOTE — Telephone Encounter (Signed)
Rx approved and sent 

## 2020-10-19 ENCOUNTER — Telehealth: Payer: Self-pay

## 2020-10-19 DIAGNOSIS — Z006 Encounter for examination for normal comparison and control in clinical research program: Secondary | ICD-10-CM

## 2020-10-19 NOTE — Telephone Encounter (Signed)
I called patient for her 90-day Identify Study follow up phone call. Patient is doing well  at this time. Pt did have chest pain and SOB with exertion. Pt followed up with her Cardiologist on 08/04/2020. I reminded patient I would call her in February for her 1 year follow-up.

## 2020-10-21 ENCOUNTER — Encounter (HOSPITAL_BASED_OUTPATIENT_CLINIC_OR_DEPARTMENT_OTHER): Payer: Self-pay | Admitting: Obstetrics & Gynecology

## 2020-10-21 ENCOUNTER — Ambulatory Visit (INDEPENDENT_AMBULATORY_CARE_PROVIDER_SITE_OTHER): Payer: BC Managed Care – PPO | Admitting: Obstetrics & Gynecology

## 2020-10-21 ENCOUNTER — Other Ambulatory Visit: Payer: Self-pay

## 2020-10-21 ENCOUNTER — Other Ambulatory Visit (HOSPITAL_BASED_OUTPATIENT_CLINIC_OR_DEPARTMENT_OTHER)
Admission: RE | Admit: 2020-10-21 | Discharge: 2020-10-21 | Disposition: A | Discharge status: Home / Self Care | Payer: BC Managed Care – PPO | Source: Ambulatory Visit | Attending: Obstetrics & Gynecology | Admitting: Obstetrics & Gynecology

## 2020-10-21 ENCOUNTER — Other Ambulatory Visit (HOSPITAL_BASED_OUTPATIENT_CLINIC_OR_DEPARTMENT_OTHER): Payer: BC Managed Care – PPO

## 2020-10-21 VITALS — BP 127/89 | HR 87 | Wt 179.0 lb

## 2020-10-21 DIAGNOSIS — N9089 Other specified noninflammatory disorders of vulva and perineum: Secondary | ICD-10-CM | POA: Diagnosis present

## 2020-10-21 DIAGNOSIS — R768 Other specified abnormal immunological findings in serum: Secondary | ICD-10-CM | POA: Diagnosis not present

## 2020-10-21 DIAGNOSIS — R7689 Other specified abnormal immunological findings in serum: Secondary | ICD-10-CM

## 2020-10-22 LAB — HCV RNA QUANT: HCV Quantitative: NOT DETECTED IU/mL (ref >50)

## 2020-10-22 LAB — HSV(HERPES SIMPLEX VRS) I + II AB-IGG
HSV 1 Glycoprotein G Ab, IgG: 27.9 index — ABNORMAL HIGH (ref 0.00–0.90)
HSV 2 Glycoprotein G Ab, IgG: 22.2 index — ABNORMAL HIGH (ref 0.00–0.90)
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
## 2020-10-30 ENCOUNTER — Telehealth: Payer: Self-pay | Admitting: Cardiology

## 2020-10-30 DIAGNOSIS — E7800 Pure hypercholesterolemia, unspecified: Secondary | ICD-10-CM

## 2020-10-30 DIAGNOSIS — I25118 Atherosclerotic heart disease of native coronary artery with other forms of angina pectoris: Secondary | ICD-10-CM

## 2020-10-30 NOTE — Telephone Encounter (Signed)
Spoke to the patient just now and let her know Dr. Munley's recommendations. She verbalizes understanding.  

## 2020-10-30 NOTE — Telephone Encounter (Signed)
   Pt c/o medication issue:  1. Name of Medication:   Alirocumab (PRALUENT) 150 MG/ML SOAJ    2. How are you currently taking this medication (dosage and times per day)? Inject 150 mg into the skin every 14 (fourteen) days.  3. Are you having a reaction (difficulty breathing--STAT)?   4. What is your medication issue? Pt is asking how does she knows if this med is working, does she needs a blood work or f/u with [facility].

## 2020-11-17 ENCOUNTER — Other Ambulatory Visit: Payer: Self-pay | Admitting: Pulmonary Disease

## 2020-12-01 ENCOUNTER — Other Ambulatory Visit: Payer: Self-pay | Admitting: Pulmonary Disease

## 2020-12-01 DIAGNOSIS — J438 Other emphysema: Secondary | ICD-10-CM

## 2020-12-07 ENCOUNTER — Other Ambulatory Visit: Payer: Self-pay | Admitting: Pulmonary Disease

## 2020-12-07 DIAGNOSIS — J438 Other emphysema: Secondary | ICD-10-CM

## 2020-12-11 ENCOUNTER — Other Ambulatory Visit: Payer: Self-pay | Admitting: Pulmonary Disease

## 2020-12-29 ENCOUNTER — Other Ambulatory Visit: Payer: Self-pay | Admitting: Pulmonary Disease

## 2020-12-29 ENCOUNTER — Telehealth: Payer: Self-pay | Admitting: Pulmonary Disease

## 2020-12-29 DIAGNOSIS — J438 Other emphysema: Secondary | ICD-10-CM

## 2020-12-29 NOTE — Telephone Encounter (Signed)
Patient dropped off FMLA paperwork at [place] office to be completed.  I have emailed them to [employer].

## 2020-12-31 ENCOUNTER — Telehealth: Payer: Self-pay | Admitting: Pulmonary Disease

## 2020-12-31 MED ORDER — AZITHROMYCIN 250 MG PO TABS: ORAL_TABLET | ORAL | 0 refills | Status: DC

## 2020-12-31 MED ORDER — PREDNISONE 10 MG PO TABS: ORAL_TABLET | ORAL | 0 refills | Status: AC

## 2020-12-31 NOTE — Telephone Encounter (Signed)
Agree that she may need to go to the emergency room if symptoms severe  Send in  z pack prednisone taper starting at 60 mg.  Reduce dose by 10 mg every 2 days.

## 2020-12-31 NOTE — Addendum Note (Signed)
Addended by: Vanessa Bailyn on: 12/31/2020 02:52 PM   Modules accepted: Orders

## 2020-12-31 NOTE — Telephone Encounter (Signed)
Called and spoke with patient, advised of recommendations per Dr. [name].  Patient verbalized understanding. Verified pharmacy and prednisone and azithromycin sent.  Nothing further needed.

## 2020-12-31 NOTE — Telephone Encounter (Signed)
Call made to patient, confirmed DOB. Patient reports increased SOB that started yesterday. Reports minimal dry cough. Has been using her nebulizer every 6 hours since yesterday. Patient is barely able to talk and complete a sentence. I told the patient with the wheezing that I hear over phone and her barely able to finish her sentences she needs to go ahead and call 911 patient states she cannot leave she has bills to pay. I inquired as to whether she is using her oxygen, she states she only uses it at night because it is not allowed in the building where she works. She report she uses her breaks to go to her car and get on her oxygen. Does not have pulse ox to check oxygen. Denies fevers, chills, sweats, and body aches. Denies that she has been around anyone sick or with covid. I again reminded patient if her symptoms get worse to please call 911. Voiced understanding.   PM please advise.

## 2021-01-01 ENCOUNTER — Telehealth: Payer: Self-pay | Admitting: Pulmonary Disease

## 2021-01-01 ENCOUNTER — Other Ambulatory Visit: Payer: Self-pay | Admitting: Pulmonary Disease

## 2021-01-01 ENCOUNTER — Other Ambulatory Visit: Payer: Self-pay | Admitting: Cardiology

## 2021-01-01 NOTE — Telephone Encounter (Signed)
Patient dropped off disability paperwork in office on 8/15, but she has not been seen in over a year.  She will need an appointment with Dr. [name] or an APP before we can complete the forms.

## 2021-01-01 NOTE — Telephone Encounter (Signed)
This message was entered in error.

## 2021-01-15 ENCOUNTER — Other Ambulatory Visit: Payer: Self-pay

## 2021-01-15 ENCOUNTER — Ambulatory Visit (INDEPENDENT_AMBULATORY_CARE_PROVIDER_SITE_OTHER): Payer: BC Managed Care – PPO | Admitting: Primary Care

## 2021-01-15 ENCOUNTER — Encounter: Payer: Self-pay | Admitting: Primary Care

## 2021-01-15 VITALS — BP 108/62 | HR 78 | Temp 98.2°F | Ht 63.0 in | Wt 172.0 lb

## 2021-01-15 DIAGNOSIS — G4734 Idiopathic sleep related nonobstructive alveolar hypoventilation: Secondary | ICD-10-CM | POA: Diagnosis not present

## 2021-01-15 DIAGNOSIS — J441 Chronic obstructive pulmonary disease with (acute) exacerbation: Secondary | ICD-10-CM

## 2021-01-15 DIAGNOSIS — Z87891 Personal history of nicotine dependence: Secondary | ICD-10-CM | POA: Diagnosis not present

## 2021-01-15 MED ORDER — GUAIFENESIN ER 600 MG PO TB12: 600.0000 mg | ORAL_TABLET | Freq: Two times a day (BID) | ORAL | 5 refills | Status: DC

## 2021-01-15 MED ORDER — PREDNISONE 10 MG PO TABS: ORAL_TABLET | ORAL | 0 refills | Status: DC

## 2021-01-15 NOTE — Assessment & Plan Note (Signed)
Continue nocturnal oxygen 

## 2021-01-15 NOTE — Assessment & Plan Note (Addendum)
-   Severe COPD with recurrent exacerbations. No recent hospitalizations. Last exacerbation was 2 weeks ago requiring oral steroids. She is compliant with Breztri, uses SABA three times a day. She had wheezing on exam. Sending in prednisone taper. Advised she use spacer with Breztri inhaler, start Mucinex '600mg'$  BID along with fluytter valve. We may need to consider adding Daliresp in the future if she continue to have flare ups. FU in 3 months with Dr. [name] with PFTs or sooner if needed.
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
## 2021-01-15 NOTE — Assessment & Plan Note (Signed)
-   Following with lung cancer screening program, LDCT in November 2021 showed lung RADS2, benign appearance or behavior. Due for annual CT chest for lung cancer screening in November 2022

## 2021-01-15 NOTE — Patient Instructions (Addendum)
Recommendation: Use spacer with Breztri inahler twice a day  Take mucinex '600mg'$  twice a day Use flutter valve three times a day for chest congestion You should be getting a CT chest for lung cancer screening in November 2022  Rx: Prednisone taper as directed for wheezing   Orders: Pulmonary function test in 3 months   Follow-up: 3 month with Dr. Barrie Dunker PFTs or sooner if needed

## 2021-01-21 ENCOUNTER — Other Ambulatory Visit: Payer: Self-pay | Admitting: Pulmonary Disease

## 2021-01-22 NOTE — Telephone Encounter (Signed)
Patient was seen on 9/2 by BW. Patient came into the office asking about her forms. Patient states she just needs them to be the same as her FMLA paperwork from last year. Informed patient that since she was seen 9/2 by BW, we can get BW to complete them on Monday and fax them directly to [employer]. Informed patient we would give her a call on Monday to collect the $29 form fee and tell her when the forms have been faxed. Patient verbalized understanding.

## 2021-01-25 ENCOUNTER — Telehealth: Payer: Self-pay | Admitting: Pulmonary Disease

## 2021-01-25 NOTE — Telephone Encounter (Signed)
Emailed Nurse, adult forms. [name] will work on the forms tonight I will contact the patient once I have the forms to fax.

## 2021-01-25 NOTE — Telephone Encounter (Signed)
Called and spoke with Patient. Requested prescription have been sent to pharmacy 01/24/21, and [facility]  Patient stated FMLA papers were being faxed today to be completed by Dr. [name]. Advised Patient someone is taking care of FMLA papers and a message will be routed to follow up.   Message routed to [facility] to follow up

## 2021-01-26 ENCOUNTER — Telehealth: Payer: Self-pay | Admitting: Primary Care

## 2021-01-26 NOTE — Telephone Encounter (Signed)
August Saucer, NP out of off today.  FMLA form signed by Willette Brace, NP.  Faxed signed form and 01/15/2021 office notes to [place] at fax# [phone].  I called patient and got credit card info to pay $29 fee.  Also put copy at front desk for her to pick up tomorrow.  Put forms in to be scanned.

## 2021-02-01 DIAGNOSIS — Z0289 Encounter for other administrative examinations: Secondary | ICD-10-CM

## 2021-02-03 ENCOUNTER — Telehealth: Payer: Self-pay | Admitting: Pulmonary Disease

## 2021-02-03 DIAGNOSIS — J438 Other emphysema: Secondary | ICD-10-CM

## 2021-02-03 MED ORDER — ALBUTEROL SULFATE (2.5 MG/3ML) 0.083% IN NEBU: 2.5000 mg | INHALATION_SOLUTION | Freq: Four times a day (QID) | RESPIRATORY_TRACT | 5 refills | Status: DC | PRN

## 2021-02-03 NOTE — Telephone Encounter (Signed)
Called and spoke with patient to let her know that order was placed for nebulizer supplies and that RX for nebulizer medication was also done. Patient expressed understanding. Nothing further needed at this time.

## 2021-02-03 NOTE — Telephone Encounter (Signed)
Pt states she called pharmacy and they sent in a rx albuterol nebulizer solution. Pharmacy states we also need to send in a rx for mouthpiece for nebulizer. Both would need to be new rx's. Please advise [phone] Pharmacy- [facility]? [phone] ** Message in med list states pt needs appt before getting albuterol but pt was seen 9/2 by BW

## 2021-02-03 NOTE — Telephone Encounter (Signed)
Called and spoke with Patient. Patient stated she needed a refill for albuterol for her neb machine sent to [facility].  Patient also stated she needed a new mouth piece and nebulizer chamber from [facility].  Patient stated a prescription was needed. Albuterol prescription sent to [facility].   Patient was last seen in office by Eustaquio Maize, NP 01/15/21.  Message routed to [facility], NP to advise on DME order

## 2021-02-03 NOTE — Telephone Encounter (Signed)
That is fine, please send in order to renew nebulizer supplies

## 2021-02-08 ENCOUNTER — Telehealth: Payer: Self-pay | Admitting: Pulmonary Disease

## 2021-02-08 DIAGNOSIS — J438 Other emphysema: Secondary | ICD-10-CM

## 2021-02-08 NOTE — Telephone Encounter (Signed)
Call made to [facility], confirmed patient DOB, they were able to locate the prescription and they are putting the script in process.   Call made to patient, confirmed DOB. Made aware [facility] has her order for nebulizer script. She also wanted to confirm her nebulizer supply order was sent. Order has been sent according to epic. Voiced understanding.   Nothing further needed at this time.

## 2021-02-10 NOTE — Telephone Encounter (Signed)
Pt states that she is into able to wait for her Rx for nebulizer meds (albuterol solutuion) from her mail in refill.  [facility] [address]  Pt asks for this to be done today as she is now completely out of the solution.  Pt can be reached at [phone].

## 2021-02-10 NOTE — Telephone Encounter (Signed)
[facility] to see what status was for pt to receive neb sol. Per pharmacist, med was just approved and all they need to do is contact pt to schedule shipment. Stated that pt was completely out of med and they said they would call pt to schedule overnight shipment.  Called and spoke with pt letting her know the info found out from [place] and pt said she received a phone call stating that they should be calling her to schedule shipment and pt said that she did receive a phone call from [place] and they are going to overnight her a shipment of the neb sol. Asked pt if she was still needing to have Rx sent to local pharmacy as stated to her that I didn't know if insurance would cover it and pt said that she would just wait until shipment came from [place]. Pt said that she does have her rescue inhaler that she can use while she waits for the neb sol. Nothing further needed.

## 2021-02-11 ENCOUNTER — Ambulatory Visit: Payer: BC Managed Care – PPO | Admitting: Pulmonary Disease
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
## 2021-03-12 ENCOUNTER — Encounter: Payer: Self-pay | Admitting: Obstetrics and Gynecology

## 2021-03-12 ENCOUNTER — Other Ambulatory Visit: Payer: Self-pay

## 2021-03-12 ENCOUNTER — Ambulatory Visit (INDEPENDENT_AMBULATORY_CARE_PROVIDER_SITE_OTHER): Payer: BC Managed Care – PPO | Admitting: Obstetrics and Gynecology

## 2021-03-12 VITALS — BP 128/82 | HR 81 | Ht 63.0 in | Wt 170.0 lb

## 2021-03-12 DIAGNOSIS — N8110 Cystocele, unspecified: Secondary | ICD-10-CM

## 2021-03-12 DIAGNOSIS — N812 Incomplete uterovaginal prolapse: Secondary | ICD-10-CM

## 2021-03-16 ENCOUNTER — Telehealth: Payer: Self-pay | Admitting: Pulmonary Disease

## 2021-03-16 NOTE — Telephone Encounter (Signed)
Called and spoke with patient who states that she has been coughing up some blood for a few weeks. States it was once a week, but has progressed to every morning. She states it is not a lot but is also not normal for her. She stated that she would like to be seen by Dr. [name]. Patient has been scheduled with Dr. [name] for Thursday 03/18/21. Nothing further needed at this time.

## 2021-03-18 ENCOUNTER — Other Ambulatory Visit: Payer: Self-pay

## 2021-03-18 ENCOUNTER — Encounter: Payer: Self-pay | Admitting: Pulmonary Disease

## 2021-03-18 ENCOUNTER — Ambulatory Visit (INDEPENDENT_AMBULATORY_CARE_PROVIDER_SITE_OTHER): Payer: BC Managed Care – PPO | Admitting: Pulmonary Disease

## 2021-03-18 ENCOUNTER — Ambulatory Visit (INDEPENDENT_AMBULATORY_CARE_PROVIDER_SITE_OTHER): Payer: BC Managed Care – PPO

## 2021-03-18 VITALS — BP 126/68 | HR 77 | Temp 97.8°F | Ht 63.0 in | Wt 178.8 lb

## 2021-03-18 DIAGNOSIS — J449 Chronic obstructive pulmonary disease, unspecified: Secondary | ICD-10-CM

## 2021-03-18 DIAGNOSIS — J438 Other emphysema: Secondary | ICD-10-CM

## 2021-03-18 MED ORDER — BREZTRI AEROSPHERE 160-9-4.8 MCG/ACT IN AERO: INHALATION_SPRAY | RESPIRATORY_TRACT | 5 refills | Status: DC

## 2021-03-18 NOTE — Progress Notes (Signed)
Laurence Folz    [id number]    01/17/1961  Primary Care Physician:Osei-Bonsu, Iona Beard, MD  Referring Physician: Benito Mccreedy, MD [address] [place],  [place] [address]  Chief complaint:  Follow-up for  Severe COPD  HPI: 60 year old smoker with very severe COPD, chronic hypoxic respiratory failure.  She is previously followed by Dr. Ashok Cordia.  Pulmicort and Perforomist were ordered last year but insurance would not cover She was then placed on Symbicort in 2018 but had worsening symptoms of dyspnea, wheeze. Inhalers were switched to Trelegy at office visit in March 2019.    Pets: No pets, birds, farm animals Occupation: Previously worked doing Environmental manager jobs in [employer]  Currently works in a Manufacturing engineer.  Exposed to D60 a gas substitute and her line of work. Exposures: No mold, hot tub, Jacuzzi. Smoking history: 35-pack-year smoker.  Quit in 2011 Travel history: No significant travel history Relevant family history: No significant family history of lung disease.  Interim history: Seeing me after a period of over a year. She had multiple visits with NPs and treated several times for COPD exacerbations  Remains on Berztri, nocturnal O2 at night She has been complaining of mild cough with intermittent specks of blood for the past few months.  This has improved recently  Outpatient Encounter Medications as of 03/18/2021  Medication Sig   albuterol (PROVENTIL) (2.5 MG/3ML) 0.083% nebulizer solution Take 3 mLs (2.5 mg total) by nebulization every 6 (six) hours as needed for wheezing or shortness of breath.   albuterol (VENTOLIN HFA) 108 (90 Base) MCG/ACT inhaler INHALE 2 PUFFS BY MOUTH INTO THE LUNGS EVERY 4 HOURS AS NEEDED FOR WHEEZE OR FOR SHORTNESS OF BREATH   Alirocumab (PRALUENT) 150 MG/ML SOAJ Inject 150 mg into the skin every 14 (fourteen) days.   BREZTRI AEROSPHERE 160-9-4.8 MCG/ACT AERO TAKE 2 PUFFS BY MOUTH TWICE A DAY    D-5000 125 MCG (5000 UT) TABS Take 1 tablet by mouth daily.   famotidine (PEPCID) 20 MG tablet TAKE 1 TABLET BY MOUTH EVERYDAY AT BEDTIME   montelukast (SINGULAIR) 10 MG tablet Take 1 tablet (10 mg total) by mouth at bedtime.   OXYGEN Inhale 3 L into the lungs at bedtime.    Vitamin D, Cholecalciferol, 10 MCG (400 UNIT) CAPS Take by mouth.   nitroGLYCERIN (NITROSTAT) 0.4 MG SL tablet PLACE 1 TABLET (0.4 MG TOTAL) UNDER THE TONGUE EVERY 5 (FIVE) MINUTES AS NEEDED FOR CHEST PAIN.   [DISCONTINUED] diltiazem (CARDIZEM CD) 240 MG 24 hr capsule Take 1 capsule (240 mg total) by mouth daily.   No facility-administered encounter medications on file as of 03/18/2021.   Physical Exam: Blood pressure 126/68, pulse 77, temperature 97.8 F (36.6 C), temperature source Skin, height 5\' 3"  (1.6 m), weight 178 lb 12.8 oz (81.1 kg), SpO2 94 %. Gen:      No acute distress HEENT:  EOMI, sclera anicteric Neck:     No masses; no thyromegaly Lungs:    Clear to auscultation bilaterally; normal respiratory effort CV:         Regular rate and rhythm; no murmurs Abd:      + bowel sounds; soft, non-tender; no palpable masses, no distension Ext:    No edema; adequate peripheral perfusion Skin:      Warm and dry; no rash Neuro: alert and oriented x 3 Psych: normal mood and affect   Data Reviewed: Imaging: Low-dose screening CT chest 08/18/16-mild emphysema, bronchial  wall thickening.  Subpleural calcified left upper lobe granuloma.  Low-dose screening CT 09/05/2017- emphysema, dependent atelectasis, stable pulmonary nodule Low-dose screening CT 04/03/2020-emphysema, stable lung nodule CT coronaries 07/01/2020-visualized lung images do not show acute abnormality I have reviewed the images personally.  PFT 06/02/17 FVC 1.50 [60%], FEV1 0.79 [40%], F/F 53, TLC 96, RV/TLC 167%, DLCO 32% Severe obstruction with bronchodilator response, air-trapping.  Severe diffusion impairment.  02/16/16: FVC 1.25 L (49%) FEV1 0.65 L  (32%) FEV1/FVC 0.52 FEF 25-75 0.30 L (14%) positive bronchodilator response 11/10/15: FVC 1.49 L (88%) FEV1 0.79 L (39%) FEV1/FVC 0.53 FEF 25-75 0.40 L (19%) negative bronchodilator response TLC 4.23 L (88%) RV 153% ERV 81% DLCO uncorrected 48% (hemoglobin 11.5) 02/21/11: FVC 2.19 L (69%) FEV1 1.01 L (42%) FEV1/FVC 0.46 FEF 25-75 0.36 L (13%) negative bronchodilator response TLC 4.36 L (92%) RV 129% ERV 63% DLCO uncorrected 31%   6MWT 11/10/15:  Walked 360 meters / Baseline Sat 96% on RA / Nadir Sat 95% on RA   MICROBIOLOGY Sputum Ctx (11/13/15):  Paecilomyces species / Oral Flora / AFB negative    LABS 03/01/15 HIV:  Negative   09/29/14 ANA:  Negative   05/16/11 Alpha-1 antitrypsin: MM (122)  Cardiac work-up by Dr. Einar Gip Nuclear stress test 11/14/16-very small inferoseptal ischemia, preserved LVEF with mild hypokinesis the same region Echocardiogram shows mild pulmonary hypertension with normal LV systolic function.  Assessment:  Severe COPD. Continue breztri, supplemental oxygen at night and with exertion Nebs as needed  Intermittent hemoptysis May be secondary to chronic bronchitis Chest x-ray today.  She is due for screening CT and will review that when completed for any lung abnormalities  Pulmonary nodules Continue annual screening CT of the chest.  She is scheduled for follow-up later this month continue breztri, supplemental oxygen and with exertion    Health maintenance Influenza vaccine-states that [facility] 09/03/2011-Pneumovax  Plan/Recommendations: - Continue breztri - Chest x-ray - Continue supplemental oxygen  Marshell Garfinkel MD [facility] 03/18/2021, 2:33 PM  CC: Benito Mccreedy, MD

## 2021-03-18 NOTE — Patient Instructions (Addendum)
I am glad you are stable with your breathing We will send in a prescription for breztri to resume the inhaler Continue albuterol Get a chest x-ray today  Follow-up in 6 months

## 2021-03-24 ENCOUNTER — Encounter: Payer: Self-pay | Admitting: *Deleted

## 2021-04-05 ENCOUNTER — Other Ambulatory Visit: Payer: Self-pay | Admitting: Pulmonary Disease

## 2021-04-19 ENCOUNTER — Other Ambulatory Visit: Payer: Self-pay

## 2021-04-19 ENCOUNTER — Ambulatory Visit (INDEPENDENT_AMBULATORY_CARE_PROVIDER_SITE_OTHER)
Admission: RE | Admit: 2021-04-19 | Discharge: 2021-04-19 | Disposition: A | Discharge status: Home / Self Care | Payer: BC Managed Care – PPO | Source: Ambulatory Visit | Attending: Acute Care | Admitting: Acute Care

## 2021-04-19 DIAGNOSIS — Z87891 Personal history of nicotine dependence: Secondary | ICD-10-CM

## 2021-04-20 ENCOUNTER — Telehealth: Payer: Self-pay | Admitting: Pulmonary Disease

## 2021-04-20 MED ORDER — BREZTRI AEROSPHERE 160-9-4.8 MCG/ACT IN AERO: INHALATION_SPRAY | RESPIRATORY_TRACT | 3 refills | Status: DC

## 2021-04-20 NOTE — Telephone Encounter (Signed)
Rx for pt's Breztri inhaler has been sent to preferred pharmacy as a 90-day supply for pt. Called and spoke with pt letting her know this had been done and she verbalized understanding. Nothing further needed.

## 2021-04-21 ENCOUNTER — Telehealth: Payer: Self-pay | Admitting: Acute Care

## 2021-04-21 NOTE — Telephone Encounter (Signed)
Spoke with Malachy Mood at [facility]- LDCT 04/19/21- Lung Rads 4A susp rec f/u in 3 months   She states full report should be uploaded soon   Sending to Judson Roch to be called through the screening program

## 2021-04-22 ENCOUNTER — Telehealth: Payer: Self-pay | Admitting: Acute Care

## 2021-04-22 DIAGNOSIS — Z87891 Personal history of nicotine dependence: Secondary | ICD-10-CM

## 2021-04-22 NOTE — Addendum Note (Signed)
Addended by: Doroteo Glassman D on: 04/22/2021 12:05 PM   Modules accepted: Orders

## 2021-04-22 NOTE — Telephone Encounter (Signed)
Called and spoke with pt and have scheduled her for an appt with Dr. [name] 12/12 at 11:30.nothing further needed.

## 2021-04-22 NOTE — Telephone Encounter (Signed)
CT results faxed to PCP. Order placed for 3 mtn f/u nodule f/u CT.

## 2021-04-22 NOTE — Telephone Encounter (Signed)
I have called the patient with the results of her low dose CT chest. I explained that her scan was read as a Lung RADS 4 A : suspicious findings, either short term follow up in 3 months or alternatively  PET Scan evaluation may be considered when there is a solid component of  8 mm or larger. There are New areas of subpleural ground-glass consolidation in both lower lobes, measuring up to 7.4 mm in the left lower lobe. Findings may be infectious/inflammatory etiology. Malignancy cannot be excluded Per the patient she has been sick. She was seen by Dr. [name] 03/2021 and was treated with prednisone, but she states her breathing has not returned to baseline. She has not called the office to be seen.  Triage, please schedule patient with Myself, Dr. [name], Eustaquio Maize NP, ( earliest available) so we can treat her. Denise, please place orders for 3 month follow up low dose CT Chest, and fax results to PCP. Thanks so much Pt. Verbalized understanding and had no further questions at completion of the call.

## 2021-04-26 ENCOUNTER — Other Ambulatory Visit: Payer: Self-pay

## 2021-04-26 ENCOUNTER — Ambulatory Visit (INDEPENDENT_AMBULATORY_CARE_PROVIDER_SITE_OTHER): Payer: BC Managed Care – PPO | Admitting: Pulmonary Disease

## 2021-04-26 ENCOUNTER — Encounter: Payer: Self-pay | Admitting: Pulmonary Disease

## 2021-04-26 VITALS — BP 126/64 | HR 81 | Temp 98.2°F | Ht 63.0 in | Wt 179.8 lb

## 2021-04-26 DIAGNOSIS — J441 Chronic obstructive pulmonary disease with (acute) exacerbation: Secondary | ICD-10-CM | POA: Diagnosis not present

## 2021-04-26 DIAGNOSIS — J449 Chronic obstructive pulmonary disease, unspecified: Secondary | ICD-10-CM | POA: Diagnosis not present

## 2021-04-26 DIAGNOSIS — R0609 Other forms of dyspnea: Secondary | ICD-10-CM

## 2021-04-26 MED ORDER — METHYLPREDNISOLONE ACETATE 80 MG/ML IJ SUSP
120.0000 mg | Freq: Once | INTRAMUSCULAR | Status: AC
  Administered 2021-04-26: 120 mg via INTRAMUSCULAR

## 2021-04-26 MED ORDER — AZITHROMYCIN 250 MG PO TABS: ORAL_TABLET | ORAL | 0 refills | Status: DC

## 2021-04-26 MED ORDER — PREDNISONE 20 MG PO TABS: ORAL_TABLET | ORAL | 0 refills | Status: DC

## 2021-04-26 NOTE — Addendum Note (Signed)
Addended by: Elton Sin on: 04/26/2021 12:32 PM   Modules accepted: Orders

## 2021-04-26 NOTE — Patient Instructions (Signed)
We will give you an injection of steroids Start Z-Pak and prednisone 40 mg a day for 5 days Continue inhaler Follow-up in 3 months

## 2021-04-26 NOTE — Progress Notes (Signed)
[name]    [id number]    04/19/1961  Primary Care Physician:Osei-Bonsu, Iona Beard, MD  Referring Physician: Benito Mccreedy, MD [address] [place],  [place] [address]  Chief complaint:  Follow-up for  Severe COPD  HPI: 60 year old smoker with very severe COPD, chronic hypoxic respiratory failure.  She is previously followed by Dr. Ashok Cordia.  Pulmicort and Perforomist were ordered last year but insurance would not cover She was then placed on Symbicort in 2018 but had worsening symptoms of dyspnea, wheeze. Inhalers were switched to Trelegy at office visit in March 2019.    Pets: No pets, birds, farm animals Occupation: Previously worked doing Environmental manager jobs and [employer]  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 Exposures: No mold, hot tub, Jacuzzi. Smoking history: 35-pack-year smoker.  Quit in 2011 Travel history: No significant travel history Relevant family history: No significant family history of lung disease.  Interim history: Complains of worsening dyspnea since last visit.  She has cough with congestion and yellow-green mucus with wheezing. She had noticed intermittent specks of blood last month but this has overall cleared up Low resolution CT from earlier this month noted with new bilateral inflammatory looking lung nodules.  Continues on breztri and supplemental oxygen at night  Outpatient Encounter Medications as of 04/26/2021  Medication Sig   albuterol (PROVENTIL) (2.5 MG/3ML) 0.083% nebulizer solution Take 3 mLs (2.5 mg total) by nebulization every 6 (six) hours as needed for wheezing or shortness of breath.   albuterol (VENTOLIN HFA) 108 (90 Base) MCG/ACT inhaler INHALE 2 PUFFS BY MOUTH INTO THE LUNGS EVERY 4 HOURS AS NEEDED FOR WHEEZE OR FOR SHORTNESS OF BREATH   Alirocumab (PRALUENT) 150 MG/ML SOAJ Inject 150 mg into the skin every 14 (fourteen) days.    Budeson-Glycopyrrol-Formoterol (BREZTRI AEROSPHERE) 160-9-4.8 MCG/ACT AERO TAKE 2 PUFFS BY MOUTH TWICE A DAY   D-5000 125 MCG (5000 UT) TABS Take 1 tablet by mouth daily.   famotidine (PEPCID) 20 MG tablet TAKE 1 TABLET BY MOUTH EVERYDAY AT BEDTIME   montelukast (SINGULAIR) 10 MG tablet Take 1 tablet (10 mg total) by mouth at bedtime.   OXYGEN Inhale 3 L into the lungs at bedtime.    [DISCONTINUED] Vitamin D, Cholecalciferol, 10 MCG (400 UNIT) CAPS Take by mouth.   nitroGLYCERIN (NITROSTAT) 0.4 MG SL tablet PLACE 1 TABLET (0.4 MG TOTAL) UNDER THE TONGUE EVERY 5 (FIVE) MINUTES AS NEEDED FOR CHEST PAIN.   No facility-administered encounter medications on file as of 04/26/2021.   Physical Exam: Blood pressure 126/64, pulse 81, temperature 98.2 F (36.8 C), temperature source Oral, height 5\' 3"  (1.6 m), weight 179 lb 12.8 oz (81.6 kg), SpO2 90 %. Gen:      No acute distress HEENT:  EOMI, sclera anicteric Neck:     No masses; no thyromegaly Lungs:    Bilateral expiratory wheeze CV:         Regular rate and rhythm; no murmurs Abd:      + bowel sounds; soft, non-tender; no palpable masses, no distension Ext:    No edema; adequate peripheral perfusion Skin:      Warm and dry; no rash Neuro: alert and oriented x 3 Psych: normal mood and affect   Data Reviewed: Imaging: Low-dose screening CT 04/19/2021-new areas of bilateral subpleural groundglass consolidation in the lower lobes, coronary artery calcification, enlarged PA, emphysema. I have reviewed the  images personally.  PFT 06/02/17 FVC 1.50 [60%], FEV1 0.79 [40%], F/F 53, TLC 96, RV/TLC 167%, DLCO 32% Severe obstruction with bronchodilator response, air-trapping.  Severe diffusion impairment.  6MWT 11/10/15:  Walked 360 meters / Baseline Sat 96% on RA / Nadir Sat 95% on RA  MICROBIOLOGY Sputum Ctx (11/13/15):  Paecilomyces species / Oral Flora / AFB negative    LABS 03/01/15 HIV:  Negative   09/29/14 ANA:  Negative    05/16/11 Alpha-1 antitrypsin: MM (122)  Cardiac work-up by Dr. Einar Gip Nuclear stress test 11/14/16-very small inferoseptal ischemia, preserved LVEF with mild hypokinesis the same region Echocardiogram shows mild pulmonary hypertension with normal LV systolic function.  Assessment:  Severe COPD. Acute exacerbation of COPD She has worsening dyspnea with wheezing and sputum production Treat with Z-Pak.  Give Depo injection today in office and prednisone for 5 days Continue breztri, supplemental oxygen at night and with exertion Nebs as needed  Pulmonary nodules Recent screening CT reviewed with inflammatory appearing bilateral lower lobe nodules. Treatment for COPD exacerbation as above Follow-up CT ordered for 3 months  Health maintenance Influenza vaccine-states that she [facility] flu vaccine this year at [facility]. 09/03/2011-Pneumovax  Plan/Recommendations: - Z-Pak, prednisone - Continue breztri - Continue supplemental oxygen - Follow-up CT in 3 months  Marshell Garfinkel MD [facility] 04/26/2021, 11:42 AM  CC: Benito Mccreedy, MD

## 2021-05-10 ENCOUNTER — Other Ambulatory Visit: Payer: Self-pay | Admitting: Cardiology

## 2021-05-10 DIAGNOSIS — I25118 Atherosclerotic heart disease of native coronary artery with other forms of angina pectoris: Secondary | ICD-10-CM

## 2021-05-10 DIAGNOSIS — E7800 Pure hypercholesterolemia, unspecified: Secondary | ICD-10-CM

## 2021-05-26 ENCOUNTER — Other Ambulatory Visit: Payer: Self-pay

## 2021-05-26 DIAGNOSIS — I25118 Atherosclerotic heart disease of native coronary artery with other forms of angina pectoris: Secondary | ICD-10-CM

## 2021-05-26 DIAGNOSIS — E7800 Pure hypercholesterolemia, unspecified: Secondary | ICD-10-CM

## 2021-05-26 LAB — LIPID PANEL
Chol/HDL Ratio: 1.7 ratio (ref 0.0–4.4)
Cholesterol, Total: 174 mg/dL (ref 100–199)
HDL: 100 mg/dL (ref >39)
LDL Chol Calc (NIH): 63 mg/dL (ref 0–99)
Triglycerides: 55 mg/dL (ref 0–149)
VLDL Cholesterol Cal: 11 mg/dL (ref 5–40)

## 2021-05-27 ENCOUNTER — Telehealth: Payer: Self-pay | Admitting: Pulmonary Disease

## 2021-05-27 ENCOUNTER — Telehealth: Payer: Self-pay

## 2021-05-27 NOTE — Telephone Encounter (Signed)
-----   Message from Richardo Priest, MD sent at 05/27/2021  7:47 AM EST ----- Normal or stable result

## 2021-05-27 NOTE — Telephone Encounter (Signed)
Spoke with patient regarding results and recommendation.  Patient verbalizes understanding and is agreeable to plan of care. Advised patient to call back with any issues or concerns.  

## 2021-06-01 ENCOUNTER — Encounter: Payer: Self-pay | Admitting: Pulmonary Disease

## 2021-06-01 ENCOUNTER — Other Ambulatory Visit: Payer: Self-pay

## 2021-06-01 ENCOUNTER — Ambulatory Visit (INDEPENDENT_AMBULATORY_CARE_PROVIDER_SITE_OTHER): Payer: BC Managed Care – PPO

## 2021-06-01 ENCOUNTER — Ambulatory Visit (INDEPENDENT_AMBULATORY_CARE_PROVIDER_SITE_OTHER): Payer: BC Managed Care – PPO | Admitting: Pulmonary Disease

## 2021-06-01 VITALS — BP 138/80 | HR 110 | Temp 98.2°F | Ht 63.0 in | Wt 182.6 lb

## 2021-06-01 DIAGNOSIS — R0609 Other forms of dyspnea: Secondary | ICD-10-CM

## 2021-06-01 DIAGNOSIS — J449 Chronic obstructive pulmonary disease, unspecified: Secondary | ICD-10-CM | POA: Diagnosis not present

## 2021-06-01 MED ORDER — AZITHROMYCIN 250 MG PO TABS: ORAL_TABLET | ORAL | 0 refills | Status: DC

## 2021-06-01 MED ORDER — PREDNISONE 20 MG PO TABS: ORAL_TABLET | ORAL | 0 refills | Status: DC

## 2021-06-01 NOTE — Progress Notes (Signed)
[name]    [id number]    Aug 27, 1960  Primary Care Physician:Osei-Bonsu, Iona Beard, MD  Referring Physician: Benito Mccreedy, MD [address] [place],  [place] [address]  Chief complaint:  Follow-up for  Severe COPD  HPI: 61 year old smoker with very severe COPD, chronic hypoxic respiratory failure.  She is previously followed by Dr. Ashok Cordia.  Pulmicort and Perforomist were ordered last year but insurance would not cover She was then placed on Symbicort in 2018 but had worsening symptoms of dyspnea, wheeze. Inhalers were switched to Trelegy at office visit in March 2019.    Pets: No pets, birds, farm animals Occupation: Previously worked doing Environmental manager jobs and [employer]  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 Exposures: No mold, hot tub, Jacuzzi. Smoking history: 35-pack-year smoker.  Quit in 2011 Travel history: No significant travel history Relevant family history: No significant family history of lung disease.  Interim history: Complains of persistent dyspnea on exertion, cough with yellow-green mucus and wheezing. She continues to have intermittent specks of blood and notes an episode over the weekend.  Prior CT imaging showed inflammatory nodules but no significant lung mass  Continues on breztri and supplemental oxygen at night  Outpatient Encounter Medications as of 06/01/2021  Medication Sig   albuterol (PROVENTIL) (2.5 MG/3ML) 0.083% nebulizer solution Take 3 mLs (2.5 mg total) by nebulization every 6 (six) hours as needed for wheezing or shortness of breath.   albuterol (VENTOLIN HFA) 108 (90 Base) MCG/ACT inhaler INHALE 2 PUFFS BY MOUTH INTO THE LUNGS EVERY 4 HOURS AS NEEDED FOR WHEEZE OR FOR SHORTNESS OF BREATH   Alirocumab (PRALUENT) 150 MG/ML SOAJ INJECT 150 MG INTO THE SKIN EVERY 14 (FOURTEEN) DAYS.   Budeson-Glycopyrrol-Formoterol (BREZTRI AEROSPHERE) 160-9-4.8  MCG/ACT AERO TAKE 2 PUFFS BY MOUTH TWICE A DAY   D-5000 125 MCG (5000 UT) TABS Take 1 tablet by mouth daily.   famotidine (PEPCID) 20 MG tablet TAKE 1 TABLET BY MOUTH EVERYDAY AT BEDTIME   montelukast (SINGULAIR) 10 MG tablet Take 1 tablet (10 mg total) by mouth at bedtime.   OXYGEN Inhale 3 L into the lungs at bedtime.    nitroGLYCERIN (NITROSTAT) 0.4 MG SL tablet PLACE 1 TABLET (0.4 MG TOTAL) UNDER THE TONGUE EVERY 5 (FIVE) MINUTES AS NEEDED FOR CHEST PAIN.   [DISCONTINUED] azithromycin (ZITHROMAX) 250 MG tablet Take 2 tabs day 1 and then 1 tab daily until completed   [DISCONTINUED] predniSONE (DELTASONE) 20 MG tablet Take 40mg  for 5 days   No facility-administered encounter medications on file as of 06/01/2021.   Physical Exam: Blood pressure 138/80, pulse (!) 110, temperature 98.2 F (36.8 C), temperature source Oral, height 5\' 3"  (1.6 m), weight 182 lb 9.6 oz (82.8 kg), SpO2 90 %. Gen:      No acute distress HEENT:  EOMI, sclera anicteric Neck:     No masses; no thyromegaly Lungs:    Clear to auscultation bilaterally; normal respiratory effort CV:         Regular rate and rhythm; no murmurs Abd:      + bowel sounds; soft, non-tender; no palpable masses, no distension Ext:    No edema; adequate peripheral perfusion Skin:      Warm and dry; no rash Neuro: alert and oriented x 3 Psych: normal mood and affect   Data Reviewed: Imaging: Low-dose screening CT 04/19/2021-new areas of bilateral subpleural groundglass  consolidation in the lower lobes, coronary artery calcification, enlarged PA, emphysema. I have reviewed the images personally.  PFT 06/02/17 FVC 1.50 [60%], FEV1 0.79 [40%], F/F 53, TLC 96, RV/TLC 167%, DLCO 32% Severe obstruction with bronchodilator response, air-trapping.  Severe diffusion impairment.  6MWT 11/10/15:  Walked 360 meters / Baseline Sat 96% on RA / Nadir Sat 95% on RA  MICROBIOLOGY Sputum Ctx (11/13/15):  Paecilomyces species / Oral Flora / AFB negative     LABS 03/01/15 HIV:  Negative   09/29/14 ANA:  Negative   05/16/11 Alpha-1 antitrypsin: MM (122)  Cardiac work-up by Dr. Einar Gip Nuclear stress test 11/14/16-very small inferoseptal ischemia, preserved LVEF with mild hypokinesis the same region Echocardiogram shows mild pulmonary hypertension with normal LV systolic function.  Assessment:  Severe COPD. Acute exacerbation of COPD Intermittent hemoptysis She has worsening dyspnea with wheezing and sputum production with blood and mucus Treat with Z-Pak and prednisone for 5 days Get chest x-ray Continue breztri, supplemental oxygen at night and with exertion Nebs as needed  If her hemoptysis persists then she may need a bronchoscopy for airway inspection  Pulmonary nodules Recent screening CT in December 2022 reviewed with inflammatory appearing bilateral lower lobe nodules. Treatment for COPD exacerbation as above Follow-up CT ordered for 3 months  Health maintenance Influenza vaccine-states that she [facility] flu [facility]. 09/03/2011-Pneumovax  Plan/Recommendations: - Z-Pak, prednisone - Continue breztri - Continue supplemental oxygen - Follow-up CT in 3 months  Marshell Garfinkel MD [facility] 06/01/2021, 11:21 AM  CC: Benito Mccreedy, MD

## 2021-06-01 NOTE — Patient Instructions (Signed)
We will get a chest x-ray today for better evaluation of the lungs Start Z-Pak, prednisone 40 mg a day for 5 days  Follow-up in 3 months

## 2021-06-02 ENCOUNTER — Other Ambulatory Visit: Payer: Self-pay | Admitting: Pulmonary Disease

## 2021-06-02 DIAGNOSIS — J438 Other emphysema: Secondary | ICD-10-CM

## 2021-06-03 ENCOUNTER — Telehealth: Payer: Self-pay | Admitting: Pulmonary Disease

## 2021-06-03 NOTE — Telephone Encounter (Signed)
Called patient and was unable to leave voicemail due to mailbox being full. Chest xray was clear and letter was sent in mail. Nothing further needed

## 2021-06-04 ENCOUNTER — Telehealth: Payer: Self-pay | Admitting: Pulmonary Disease

## 2021-06-04 MED ORDER — PREDNISONE 20 MG PO TABS: ORAL_TABLET | ORAL | 0 refills | Status: DC

## 2021-06-04 NOTE — Telephone Encounter (Signed)
Called and spoke with patient. She verbalized understanding. She confirmed that she just needed 2 days worth. RX has been sent.   Nothing further needed at time of call.

## 2021-06-04 NOTE — Telephone Encounter (Signed)
Okay to send in a new prescription for prednisone

## 2021-06-04 NOTE — Telephone Encounter (Signed)
Called and spoke with patient. She stated that she has lost her prednisone. She had the bottle in her purse and she thought it had fallen out in her car. She searched this morning for it but did not find it.   She wants to know if Dr. [name] would be willing to send in 4 tablets so that she can finish out the taper.   [facility] [address].   Dr. [name], please advise. Thanks!

## 2021-06-12 ENCOUNTER — Other Ambulatory Visit: Payer: Self-pay | Admitting: Cardiology

## 2021-06-16 ENCOUNTER — Other Ambulatory Visit: Payer: Self-pay | Admitting: Cardiology

## 2021-06-19 LAB — HEPATIC FUNCTION PANEL
ALT: 22 IU/L (ref 0–32)
AST: 26 IU/L (ref 0–40)
Albumin: 4.2 g/dL (ref 3.8–4.8)
Alkaline Phosphatase: 69 IU/L (ref 44–121)
Bilirubin Total: 0.3 mg/dL (ref 0.0–1.2)
Bilirubin, Direct: <0.1 mg/dL (ref 0.00–0.40)
Total Protein: 6.5 g/dL (ref 6.0–8.5)

## 2021-06-19 LAB — BASIC METABOLIC PANEL
BUN/Creatinine Ratio: 13 (ref 12–28)
BUN: 10 mg/dL (ref 8–27)
CO2: 23 mmol/L (ref 20–29)
Calcium: 9.3 mg/dL (ref 8.7–10.3)
Chloride: 98 mmol/L (ref 96–106)
Creatinine, Ser: 0.8 mg/dL (ref 0.57–1.00)
Glucose: 81 mg/dL (ref 70–99)
Potassium: 4 mmol/L (ref 3.5–5.2)
Sodium: 140 mmol/L (ref 134–144)
eGFR: 83 mL/min/{1.73_m2} (ref >59)

## 2021-06-19 LAB — SPECIMEN STATUS REPORT

## 2021-06-20 ENCOUNTER — Other Ambulatory Visit: Payer: Self-pay | Admitting: Cardiology

## 2021-06-21 MED ORDER — NITROGLYCERIN 0.4 MG SL SUBL: 0.4000 mg | SUBLINGUAL_TABLET | SUBLINGUAL | 1 refills | Status: DC | PRN

## 2021-06-21 NOTE — Addendum Note (Signed)
Addended by: Elasia Furnish L on: 06/21/2021 09:00 AM   Modules accepted: Orders

## 2021-06-25 ENCOUNTER — Inpatient Hospital Stay (HOSPITAL_COMMUNITY)
Admission: EM | Admit: 2021-06-25 | Discharge: 2021-06-27 | DRG: 190 | Disposition: A | Discharge status: Home / Self Care | Payer: BC Managed Care – PPO | Attending: Internal Medicine | Admitting: Internal Medicine

## 2021-06-25 ENCOUNTER — Other Ambulatory Visit: Payer: Self-pay

## 2021-06-25 ENCOUNTER — Encounter (HOSPITAL_COMMUNITY): Payer: Self-pay

## 2021-06-25 ENCOUNTER — Emergency Department (HOSPITAL_COMMUNITY): Payer: BC Managed Care – PPO

## 2021-06-25 DIAGNOSIS — Z880 Allergy status to penicillin: Secondary | ICD-10-CM

## 2021-06-25 DIAGNOSIS — I2510 Atherosclerotic heart disease of native coronary artery without angina pectoris: Secondary | ICD-10-CM | POA: Diagnosis present

## 2021-06-25 DIAGNOSIS — E785 Hyperlipidemia, unspecified: Secondary | ICD-10-CM | POA: Diagnosis present

## 2021-06-25 DIAGNOSIS — Z79899 Other long term (current) drug therapy: Secondary | ICD-10-CM

## 2021-06-25 DIAGNOSIS — Z6831 Body mass index (BMI) 31.0-31.9, adult: Secondary | ICD-10-CM

## 2021-06-25 DIAGNOSIS — Z87891 Personal history of nicotine dependence: Secondary | ICD-10-CM

## 2021-06-25 DIAGNOSIS — F1911 Other psychoactive substance abuse, in remission: Secondary | ICD-10-CM | POA: Diagnosis present

## 2021-06-25 DIAGNOSIS — Z20822 Contact with and (suspected) exposure to covid-19: Secondary | ICD-10-CM | POA: Diagnosis present

## 2021-06-25 DIAGNOSIS — E7800 Pure hypercholesterolemia, unspecified: Secondary | ICD-10-CM

## 2021-06-25 DIAGNOSIS — Z823 Family history of stroke: Secondary | ICD-10-CM

## 2021-06-25 DIAGNOSIS — J9621 Acute and chronic respiratory failure with hypoxia: Secondary | ICD-10-CM

## 2021-06-25 DIAGNOSIS — J441 Chronic obstructive pulmonary disease with (acute) exacerbation: Secondary | ICD-10-CM | POA: Diagnosis not present

## 2021-06-25 DIAGNOSIS — Z9981 Dependence on supplemental oxygen: Secondary | ICD-10-CM

## 2021-06-25 DIAGNOSIS — I25118 Atherosclerotic heart disease of native coronary artery with other forms of angina pectoris: Secondary | ICD-10-CM | POA: Diagnosis not present

## 2021-06-25 DIAGNOSIS — E669 Obesity, unspecified: Secondary | ICD-10-CM | POA: Diagnosis present

## 2021-06-25 DIAGNOSIS — Z888 Allergy status to other drugs, medicaments and biological substances status: Secondary | ICD-10-CM

## 2021-06-25 DIAGNOSIS — Z8249 Family history of ischemic heart disease and other diseases of the circulatory system: Secondary | ICD-10-CM

## 2021-06-25 DIAGNOSIS — K219 Gastro-esophageal reflux disease without esophagitis: Secondary | ICD-10-CM | POA: Diagnosis present

## 2021-06-25 DIAGNOSIS — Z825 Family history of asthma and other chronic lower respiratory diseases: Secondary | ICD-10-CM

## 2021-06-25 DIAGNOSIS — I10 Essential (primary) hypertension: Secondary | ICD-10-CM | POA: Diagnosis present

## 2021-06-25 DIAGNOSIS — Z886 Allergy status to analgesic agent status: Secondary | ICD-10-CM

## 2021-06-25 DIAGNOSIS — Z7952 Long term (current) use of systemic steroids: Secondary | ICD-10-CM

## 2021-06-25 LAB — CBC WITH DIFFERENTIAL/PLATELET
Abs Immature Granulocytes: 0.02 10*3/uL (ref 0.00–0.07)
Basophils Absolute: 0 10*3/uL (ref 0.0–0.1)
Basophils Relative: 0 %
Eosinophils Absolute: 0.1 10*3/uL (ref 0.0–0.5)
Eosinophils Relative: 1 %
HCT: 40 % (ref 36.0–46.0)
Hemoglobin: 13 g/dL (ref 12.0–15.0)
Immature Granulocytes: 0 %
Lymphocytes Relative: 20 %
Lymphs Abs: 1.8 10*3/uL (ref 0.7–4.0)
MCH: 29.2 pg (ref 26.0–34.0)
MCHC: 32.5 g/dL (ref 30.0–36.0)
MCV: 89.9 fL (ref 80.0–100.0)
Monocytes Absolute: 0.8 10*3/uL (ref 0.1–1.0)
Monocytes Relative: 9 %
Neutro Abs: 6.3 10*3/uL (ref 1.7–7.7)
Neutrophils Relative %: 70 %
Platelets: 317 10*3/uL (ref 150–400)
RBC: 4.45 MIL/uL (ref 3.87–5.11)
RDW: 13.6 % (ref 11.5–15.5)
WBC: 9.1 10*3/uL (ref 4.0–10.5)
nRBC: 0 % (ref 0.0–0.2)

## 2021-06-25 LAB — BLOOD GAS, VENOUS
Acid-Base Excess: 1.3 mmol/L (ref 0.0–2.0)
Bicarbonate: 26.8 mmol/L (ref 20.0–28.0)
O2 Saturation: 77.5 %
Patient temperature: 98.6
pCO2, Ven: 47.6 mmHg (ref 44.0–60.0)
pH, Ven: 7.37 (ref 7.250–7.430)
pO2, Ven: 46.6 mmHg — ABNORMAL HIGH (ref 32.0–45.0)

## 2021-06-25 LAB — COMPREHENSIVE METABOLIC PANEL
ALT: 16 U/L (ref 0–44)
AST: 19 U/L (ref 15–41)
Albumin: 4.3 g/dL (ref 3.5–5.0)
Alkaline Phosphatase: 68 U/L (ref 38–126)
Anion gap: 11 (ref 5–15)
BUN: 9 mg/dL (ref 8–23)
CO2: 26 mmol/L (ref 22–32)
Calcium: 9.4 mg/dL (ref 8.9–10.3)
Chloride: 99 mmol/L (ref 98–111)
Creatinine, Ser: 0.78 mg/dL (ref 0.44–1.00)
GFR, Estimated: >60 mL/min (ref >60)
Glucose, Bld: 112 mg/dL — ABNORMAL HIGH (ref 70–99)
Potassium: 4.2 mmol/L (ref 3.5–5.1)
Sodium: 136 mmol/L (ref 135–145)
Total Bilirubin: 0.7 mg/dL (ref 0.3–1.2)
Total Protein: 8 g/dL (ref 6.5–8.1)

## 2021-06-25 LAB — RESP PANEL BY RT-PCR (FLU A&B, COVID) ARPGX2
Influenza A by PCR: NEGATIVE
Influenza B by PCR: NEGATIVE
SARS Coronavirus 2 by RT PCR: NEGATIVE

## 2021-06-25 LAB — LACTIC ACID, PLASMA: Lactic Acid, Venous: 0.9 mmol/L (ref 0.5–1.9)

## 2021-06-25 MED ORDER — IBUPROFEN 200 MG PO TABS: 400.0000 mg | ORAL_TABLET | Freq: Four times a day (QID) | ORAL | Status: DC | PRN

## 2021-06-25 MED ORDER — BUDESON-GLYCOPYRROL-FORMOTEROL 160-9-4.8 MCG/ACT IN AERO: 2.0000 | INHALATION_SPRAY | Freq: Two times a day (BID) | RESPIRATORY_TRACT | Status: DC

## 2021-06-25 MED ORDER — ALBUTEROL SULFATE (2.5 MG/3ML) 0.083% IN NEBU
10.0000 mg | INHALATION_SOLUTION | Freq: Once | RESPIRATORY_TRACT | Status: AC
  Administered 2021-06-25: 10 mg via RESPIRATORY_TRACT
  Filled 2021-06-25: qty 12

## 2021-06-25 MED ORDER — MAGNESIUM SULFATE 2 GM/50ML IV SOLN
2.0000 g | Freq: Once | INTRAVENOUS | Status: AC
  Administered 2021-06-25: 2 g via INTRAVENOUS
  Filled 2021-06-25: qty 50

## 2021-06-25 MED ORDER — PREDNISONE 20 MG PO TABS
40.0000 mg | ORAL_TABLET | Freq: Every day | ORAL | Status: DC
  Administered 2021-06-26: 40 mg via ORAL
  Filled 2021-06-25: qty 2

## 2021-06-25 MED ORDER — POLYETHYLENE GLYCOL 3350 17 G PO PACK: 17.0000 g | PACK | Freq: Every day | ORAL | Status: DC | PRN

## 2021-06-25 MED ORDER — ALBUTEROL SULFATE (2.5 MG/3ML) 0.083% IN NEBU
10.0000 mg/h | INHALATION_SOLUTION | Freq: Once | RESPIRATORY_TRACT | Status: AC
  Administered 2021-06-25: 10 mg/h via RESPIRATORY_TRACT
  Filled 2021-06-25: qty 12

## 2021-06-25 MED ORDER — NITROGLYCERIN 0.4 MG SL SUBL: 0.4000 mg | SUBLINGUAL_TABLET | SUBLINGUAL | Status: DC | PRN

## 2021-06-25 MED ORDER — MONTELUKAST SODIUM 10 MG PO TABS
10.0000 mg | ORAL_TABLET | Freq: Every day | ORAL | Status: DC
  Administered 2021-06-25 – 2021-06-26 (×2): 10 mg via ORAL
  Filled 2021-06-25 (×3): qty 1

## 2021-06-25 MED ORDER — SODIUM CHLORIDE 0.9 % IV SOLN: INTRAVENOUS | Status: DC

## 2021-06-25 MED ORDER — SODIUM CHLORIDE 0.9% FLUSH
3.0000 mL | Freq: Two times a day (BID) | INTRAVENOUS | Status: DC
  Administered 2021-06-26 – 2021-06-27 (×3): 3 mL via INTRAVENOUS

## 2021-06-25 MED ORDER — MOMETASONE FURO-FORMOTEROL FUM 100-5 MCG/ACT IN AERO
2.0000 | INHALATION_SPRAY | Freq: Two times a day (BID) | RESPIRATORY_TRACT | Status: DC
  Administered 2021-06-26 – 2021-06-27 (×3): 2 via RESPIRATORY_TRACT
  Filled 2021-06-25: qty 8.8

## 2021-06-25 MED ORDER — UMECLIDINIUM BROMIDE 62.5 MCG/ACT IN AEPB
1.0000 | INHALATION_SPRAY | Freq: Every day | RESPIRATORY_TRACT | Status: DC
  Administered 2021-06-26 – 2021-06-27 (×2): 1 via RESPIRATORY_TRACT
  Filled 2021-06-25: qty 7

## 2021-06-25 MED ORDER — IPRATROPIUM-ALBUTEROL 0.5-2.5 (3) MG/3ML IN SOLN
3.0000 mL | Freq: Four times a day (QID) | RESPIRATORY_TRACT | Status: DC
  Administered 2021-06-25 – 2021-06-26 (×2): 3 mL via RESPIRATORY_TRACT
  Filled 2021-06-25: qty 3

## 2021-06-25 MED ORDER — ALBUTEROL SULFATE (2.5 MG/3ML) 0.083% IN NEBU: 2.5000 mg | INHALATION_SOLUTION | RESPIRATORY_TRACT | Status: DC | PRN

## 2021-06-25 MED ORDER — ENOXAPARIN SODIUM 40 MG/0.4ML IJ SOSY
40.0000 mg | PREFILLED_SYRINGE | INTRAMUSCULAR | Status: DC
  Administered 2021-06-26 – 2021-06-27 (×2): 40 mg via SUBCUTANEOUS
  Filled 2021-06-25 (×2): qty 0.4

## 2021-06-25 MED ORDER — METHYLPREDNISOLONE SODIUM SUCC 125 MG IJ SOLR
125.0000 mg | Freq: Once | INTRAMUSCULAR | Status: AC
  Administered 2021-06-25: 125 mg via INTRAVENOUS
  Filled 2021-06-25: qty 2

## 2021-06-25 MED ORDER — FAMOTIDINE 20 MG PO TABS
20.0000 mg | ORAL_TABLET | Freq: Every day | ORAL | Status: DC
  Administered 2021-06-25 – 2021-06-26 (×2): 20 mg via ORAL
  Filled 2021-06-25 (×2): qty 1

## 2021-06-25 MED ORDER — KETOROLAC TROMETHAMINE 15 MG/ML IJ SOLN
15.0000 mg | Freq: Once | INTRAMUSCULAR | Status: AC
  Administered 2021-06-25: 15 mg via INTRAVENOUS
  Filled 2021-06-25: qty 1
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
## 2021-06-25 NOTE — ED Provider Notes (Addendum)
[facility] DEPT Provider Note   CSN: [id number] Arrival date & time: 06/25/21  1759     History  Chief Complaint  Patient presents with   Shortness of Breath    [name] is a 61 y.o. female.  HPI Patient arrived by private vehicle, seen at triage and felt to be acutely ill with respiratory distress, hypoxia and noted to be febrile.  She is unable to give history.    Home Medications Prior to Admission medications   Medication Sig Start Date End Date Taking? Authorizing Provider  albuterol (PROVENTIL) (2.5 MG/3ML) 0.083% nebulizer solution USE 1 VIAL IN NEBULIZER EVERY 6 HOURS - as needed for wheezing or shortness of breath 06/02/21   Mannam, Praveen, MD  albuterol (VENTOLIN HFA) 108 (90 Base) MCG/ACT inhaler INHALE 2 PUFFS BY MOUTH INTO THE LUNGS EVERY 4 HOURS AS NEEDED FOR WHEEZE OR FOR SHORTNESS OF BREATH 04/06/21   Mannam, Praveen, MD  Alirocumab (PRALUENT) 150 MG/ML SOAJ INJECT 150 MG INTO THE SKIN EVERY 14 (FOURTEEN) DAYS. 05/11/21   Richardo Priest, MD  azithromycin (ZITHROMAX) 250 MG tablet Take as directed 06/01/21   Mannam, Hart Robinsons, MD  Budeson-Glycopyrrol-Formoterol (BREZTRI AEROSPHERE) 160-9-4.8 MCG/ACT AERO TAKE 2 PUFFS BY MOUTH TWICE A DAY 04/20/21   Mannam, Praveen, MD  D-5000 125 MCG (5000 UT) TABS Take 1 tablet by mouth daily. 03/09/21   [provider]  famotidine (PEPCID) 20 MG tablet TAKE 1 TABLET BY MOUTH EVERYDAY AT BEDTIME 01/01/21   Richardo Priest, MD  montelukast (SINGULAIR) 10 MG tablet Take 1 tablet (10 mg total) by mouth at bedtime. 08/12/20   Mannam, Hart Robinsons, MD  nitroGLYCERIN (NITROSTAT) 0.4 MG SL tablet Place 1 tablet (0.4 mg total) under the tongue every 5 (five) minutes as needed for chest pain. 06/21/21   Richardo Priest, MD  OXYGEN Inhale 3 L into the lungs at bedtime.     [provider]  predniSONE (DELTASONE) 20 MG tablet Take 40mg  once daily 06/04/21   Mannam, Hart Robinsons, MD      Allergies     Amoxicillin, Tylenol [acetaminophen], Statins, and Zetia [ezetimibe]    Review of Systems   Review of Systems  Physical Exam Updated Vital Signs BP 110/73    Pulse 98    Temp 99.5 F (37.5 C) (Oral)    Resp 16    Ht 5\' 3"  (1.6 m)    Wt 81.2 kg    SpO2 97%    BMI 31.71 kg/m  Physical Exam Vitals and nursing note reviewed.  Constitutional:      General: She is in acute distress.     Appearance: She is well-developed. She is not ill-appearing, toxic-appearing or diaphoretic.     Comments: Appears older than stated age.  She is groaning.  She does not answer questions.  HENT:     Head: Normocephalic and atraumatic.     Right Ear: External ear normal.     Left Ear: External ear normal.  Eyes:     Conjunctiva/sclera: Conjunctivae normal.     Pupils: Pupils are equal, round, and reactive to light.  Neck:     Trachea: Phonation normal.  Cardiovascular:     Rate and Rhythm: Regular rhythm. Tachycardia present.     Heart sounds: Normal heart sounds.  Pulmonary:     Effort: Pulmonary effort is normal.     Breath sounds: No stridor. No rhonchi.     Comments: Decreased ROM bilaterally with expiratory wheezing.  Tachypneic.  Using accessory muscles to breathe. Abdominal:     General: There is no distension.     Palpations: Abdomen is soft.     Tenderness: There is no abdominal tenderness.  Musculoskeletal:        General: Normal range of motion.     Cervical back: Normal range of motion and neck supple.  Skin:    General: Skin is warm and dry.  Neurological:     Mental Status: She is alert.     Cranial Nerves: No cranial nerve deficit.     Sensory: No sensory deficit.     Motor: No abnormal muscle tone.     Coordination: Coordination normal.  Psychiatric:        Mood and Affect: Mood normal.        Behavior: Behavior normal.    ED Results / Procedures / Treatments   Labs (all labs ordered are listed, but only abnormal results are displayed) Labs Reviewed  COMPREHENSIVE  METABOLIC PANEL - Abnormal; Notable for the following components:      Result Value   Glucose, Bld 112 (*)    All other components within normal limits  BLOOD GAS, VENOUS - Abnormal; Notable for the following components:   pO2, Ven 46.6 (*)    All other components within normal limits  RESP PANEL BY RT-PCR (FLU A&B, COVID) ARPGX2  CULTURE, BLOOD (ROUTINE X 2)  CULTURE, BLOOD (ROUTINE X 2)  CBC WITH DIFFERENTIAL/PLATELET  LACTIC ACID, PLASMA  LACTIC ACID, PLASMA    EKG None   Date: 06/25/21  Rate: 125  Rhythm: sinus tachycardia  QRS Axis: normal  PR and QT Intervals: normal  ST/T Wave abnormalities: normal  PR and QRS Conduction Disutrbances:none   Radiology DG Chest Port 1 View  Result Date: 06/25/2021 CLINICAL DATA:  Shortness of breath and fever.  Audible wheezing. EXAM: PORTABLE CHEST 1 VIEW COMPARISON:  06/01/2021 FINDINGS: Artifact overlies the chest. Heart size is normal. Mild tortuosity of the aorta. The upper lungs are clear. Chronic scarring in the right mid lung. Lung bases are poorly penetrated but are probably clear. No visible effusion. IMPRESSION: Portable AP film, somewhat under penetrated. No active disease identified. If concern persists, consider two-view chest radiography. Electronically Signed   By: Nelson Chimes M.D.   On: 06/25/2021 19:08    Procedures Procedures    Medications Ordered in ED Medications  0.9 %  sodium chloride infusion ( Intravenous New Bag/Given 06/25/21 1827)  albuterol (PROVENTIL) (2.5 MG/3ML) 0.083% nebulizer solution 10 mg (10 mg Nebulization Given 06/25/21 1817)  methylPREDNISolone sodium succinate (SOLU-MEDROL) 125 mg/2 mL injection 125 mg (125 mg Intravenous Given 06/25/21 1822)  magnesium sulfate IVPB 2 g 50 mL (0 g Intravenous Stopped 06/25/21 2108)  ketorolac (TORADOL) 15 MG/ML injection 15 mg (15 mg Intravenous Given 06/25/21 1821)  albuterol (PROVENTIL) (2.5 MG/3ML) 0.083% nebulizer solution (10 mg/hr Nebulization Given 06/25/21  2218)    ED Course/ Medical Decision Making/ A&P Clinical Course as of 06/25/21 2221  Fri Jun 25, 2021  1815 Patient responded quickly to facemask oxygen with saturations greater than 95%.  She has increased work of breathing but not in respiratory distress. [EW]  2058 Patient is much more comfortable now, lungs with movement and scattered wheezes and rhonchi.  She still has decreased air movement bilaterally.  Her O2 saturation on 3 L nasal cannula, her usual is 97%. [EW]  2201 Ambulatory trial-when patient walked, with oxygen and her saturations dropped to the mid 80s.  Immediately  upon return she had notable increased work of breathing and decreased air movement bilaterally.  Also tachypneic at this time.  Patient has not candidate for discharge due to ambulatory respiratory distress. [EW]    Clinical Course User Index [EW] Daleen Bo, MD                           Medical Decision Making Presenting with respiratory distress.  She has a history of COPD.  Amount and/or Complexity of Data Reviewed External Data Reviewed: labs, radiology and notes.    Details: Follows up with pulmonary regularly for COPD, saw them about 3 weeks ago with COPD exacerbation, treated with antibiotics and pulmonary medications. Labs: ordered.    Details: CBC, Metabolic panel, blood culture, lactate -normal except glucose slightly elevated Radiology: ordered.    Details: Chest x-ray-no infiltrate or edema. ECG/medicine tests: ordered.    Details: Cardiac monitor-sinus tachycardia Discussion of management or test interpretation with external provider(s): Case discussed with hospitalist to arrange admission.  He will see the patient in the ED.  Risk Prescription drug management. Decision regarding hospitalization. Risk Details: Patient presenting with difficulty breathing, increased work of breathing, and hypoxia, on arrival.  She was treated immediately and had rapid improvement.  She was weaned to her  usual oxygen, 2 to 3 L by nasal cannula remained comfortable.  No evidence for pneumonia, viral infection, metabolic disorders or suggestion for impending vascular collapse.  Amatory trial, patient became hypoxic, tachypneic and had increased work of breathing.  She would therefore require further treatment.  Second continuous nebulizer ordered with 10 mg of albuterol.  Contacted hospitalist to admit.  Critical Care Total time providing critical care: 30-74 minutes          Final Clinical Impression(s) / ED Diagnoses Final diagnoses:  COPD exacerbation [facility])    Rx / DC Orders ED Discharge Orders     None         Daleen Bo, MD 06/25/21 2221    Daleen Bo, MD 06/25/21 2326

## 2021-06-25 NOTE — ED Triage Notes (Signed)
Pt c/o sob. Pt audible wheezing upon arrival.  Pt appears in distress upon arrival.  Pt to room, NRB applied @ 15L, MD Eulis Foster to room.

## 2021-06-26 ENCOUNTER — Other Ambulatory Visit: Payer: Self-pay

## 2021-06-26 DIAGNOSIS — Z823 Family history of stroke: Secondary | ICD-10-CM | POA: Diagnosis not present

## 2021-06-26 DIAGNOSIS — K219 Gastro-esophageal reflux disease without esophagitis: Secondary | ICD-10-CM | POA: Diagnosis present

## 2021-06-26 DIAGNOSIS — E669 Obesity, unspecified: Secondary | ICD-10-CM | POA: Diagnosis present

## 2021-06-26 DIAGNOSIS — Z7952 Long term (current) use of systemic steroids: Secondary | ICD-10-CM | POA: Diagnosis not present

## 2021-06-26 DIAGNOSIS — Z87891 Personal history of nicotine dependence: Secondary | ICD-10-CM | POA: Diagnosis not present

## 2021-06-26 DIAGNOSIS — Z888 Allergy status to other drugs, medicaments and biological substances status: Secondary | ICD-10-CM | POA: Diagnosis not present

## 2021-06-26 DIAGNOSIS — Z79899 Other long term (current) drug therapy: Secondary | ICD-10-CM | POA: Diagnosis not present

## 2021-06-26 DIAGNOSIS — J9621 Acute and chronic respiratory failure with hypoxia: Secondary | ICD-10-CM

## 2021-06-26 DIAGNOSIS — J441 Chronic obstructive pulmonary disease with (acute) exacerbation: Secondary | ICD-10-CM | POA: Diagnosis present

## 2021-06-26 DIAGNOSIS — Z8249 Family history of ischemic heart disease and other diseases of the circulatory system: Secondary | ICD-10-CM | POA: Diagnosis not present

## 2021-06-26 DIAGNOSIS — Z6831 Body mass index (BMI) 31.0-31.9, adult: Secondary | ICD-10-CM | POA: Diagnosis not present

## 2021-06-26 DIAGNOSIS — Z825 Family history of asthma and other chronic lower respiratory diseases: Secondary | ICD-10-CM | POA: Diagnosis not present

## 2021-06-26 DIAGNOSIS — Z9981 Dependence on supplemental oxygen: Secondary | ICD-10-CM | POA: Diagnosis not present

## 2021-06-26 DIAGNOSIS — E785 Hyperlipidemia, unspecified: Secondary | ICD-10-CM | POA: Diagnosis present

## 2021-06-26 DIAGNOSIS — Z880 Allergy status to penicillin: Secondary | ICD-10-CM | POA: Diagnosis not present

## 2021-06-26 DIAGNOSIS — Z20822 Contact with and (suspected) exposure to covid-19: Secondary | ICD-10-CM | POA: Diagnosis present

## 2021-06-26 DIAGNOSIS — Z886 Allergy status to analgesic agent status: Secondary | ICD-10-CM | POA: Diagnosis not present

## 2021-06-26 DIAGNOSIS — I2510 Atherosclerotic heart disease of native coronary artery without angina pectoris: Secondary | ICD-10-CM | POA: Diagnosis present

## 2021-06-26 DIAGNOSIS — I10 Essential (primary) hypertension: Secondary | ICD-10-CM | POA: Diagnosis present

## 2021-06-26 HISTORY — DX: Acute and chronic respiratory failure with hypoxia: J96.21

## 2021-06-26 LAB — COMPREHENSIVE METABOLIC PANEL
ALT: 14 U/L (ref 0–44)
AST: 18 U/L (ref 15–41)
Albumin: 3.4 g/dL — ABNORMAL LOW (ref 3.5–5.0)
Alkaline Phosphatase: 53 U/L (ref 38–126)
Anion gap: 8 (ref 5–15)
BUN: 10 mg/dL (ref 8–23)
CO2: 24 mmol/L (ref 22–32)
Calcium: 8.5 mg/dL — ABNORMAL LOW (ref 8.9–10.3)
Chloride: 104 mmol/L (ref 98–111)
Creatinine, Ser: 0.52 mg/dL (ref 0.44–1.00)
GFR, Estimated: >60 mL/min (ref >60)
Glucose, Bld: 156 mg/dL — ABNORMAL HIGH (ref 70–99)
Potassium: 4 mmol/L (ref 3.5–5.1)
Sodium: 136 mmol/L (ref 135–145)
Total Bilirubin: 0.2 mg/dL — ABNORMAL LOW (ref 0.3–1.2)
Total Protein: 6.6 g/dL (ref 6.5–8.1)

## 2021-06-26 LAB — CBC
HCT: 36.2 % (ref 36.0–46.0)
Hemoglobin: 11.6 g/dL — ABNORMAL LOW (ref 12.0–15.0)
MCH: 29.1 pg (ref 26.0–34.0)
MCHC: 32 g/dL (ref 30.0–36.0)
MCV: 90.7 fL (ref 80.0–100.0)
Platelets: 261 10*3/uL (ref 150–400)
RBC: 3.99 MIL/uL (ref 3.87–5.11)
RDW: 13.4 % (ref 11.5–15.5)
WBC: 7.1 10*3/uL (ref 4.0–10.5)
nRBC: 0 % (ref 0.0–0.2)

## 2021-06-26 MED ORDER — ALBUTEROL SULFATE (2.5 MG/3ML) 0.083% IN NEBU
2.5000 mg | INHALATION_SOLUTION | Freq: Three times a day (TID) | RESPIRATORY_TRACT | Status: DC
  Administered 2021-06-27: 2.5 mg via RESPIRATORY_TRACT
  Filled 2021-06-26 (×2): qty 3

## 2021-06-26 MED ORDER — METHYLPREDNISOLONE SODIUM SUCC 40 MG IJ SOLR
40.0000 mg | Freq: Three times a day (TID) | INTRAMUSCULAR | Status: DC
  Administered 2021-06-26 – 2021-06-27 (×4): 40 mg via INTRAVENOUS
  Filled 2021-06-26 (×4): qty 1

## 2021-06-26 MED ORDER — IPRATROPIUM-ALBUTEROL 0.5-2.5 (3) MG/3ML IN SOLN
3.0000 mL | Freq: Three times a day (TID) | RESPIRATORY_TRACT | Status: DC
  Administered 2021-06-26 (×2): 3 mL via RESPIRATORY_TRACT
  Filled 2021-06-26 (×2): qty 3

## 2021-06-26 NOTE — Assessment & Plan Note (Signed)
No chest pain

## 2021-06-26 NOTE — Hospital Course (Signed)
61 y.o. female with COPD,CAD,HTN,GERD, substance use, hyperlipidemia, obesity, statin myopathy, uterine leiomyoma who presents with shortness of breath that is started since 2/9, progressively worsening despite using inhaler and nebulizer at home.  She has oxygen at home at 4 L during nighttime and also daytime as needed. ED Course: Vital signs in the ED significant for initial temperature 102.4, heart rate initially 120s improved to the 90s, respiratory rate in the 20s initially on 15 L now weaned to 2 L, blood pressure in the 09F to 818E systolic.  Lab work-up included CMP with glucose 112.  CBC was normal.  Lactic acid normal with repeat pending.  Respiratory panel for flu and COVID-negative.  VBG with normal pH and PCO2.  Blood cultures pending.  Chest x-ray without acute abnormality but did note that there was some limitations in the study due to it being a portable film that was somewhat underpenetrated.  Patient received Toradol, Solu-Medrol, magnesium, albuterol, IV fluids in the ED.  Patient initially improved some with treatment and attempted to walk afterwards and immediately became tachycardic, tachypneic and desaturated.

## 2021-06-26 NOTE — Assessment & Plan Note (Signed)
Not on statin.  Follow-up with PCP

## 2021-06-26 NOTE — Assessment & Plan Note (Addendum)
Patient uses home oxygen 4 L during the night and sometimes as needed.  Remains stable at home setting.  Improved.

## 2021-06-26 NOTE — Assessment & Plan Note (Signed)
Continue Pepcid  

## 2021-06-26 NOTE — Assessment & Plan Note (Signed)
Will benefit with PCP follow-up, weight loss  healthy lifestyle and outpatient sleep evaluation.

## 2021-06-26 NOTE — Assessment & Plan Note (Signed)
Follow up with PCP

## 2021-06-26 NOTE — Assessment & Plan Note (Addendum)
Respiratory status improved with IV Solu-Medrol bronchodilator therapy and home inhalers.  Ambulating well on home oxygen setting.  She will be discharged home on oral steroid taper and she will follow-up with PCP in a week.

## 2021-06-26 NOTE — Progress Notes (Signed)
PROGRESS NOTE [name]  ZOX:[id number] DOB: 1961-03-18 DOA: 06/25/2021 PCP: Benito Mccreedy, MD   Brief Narrative/Hospital Course: [name], 61 y.o. female 61 y.o. female with COPD,CAD,HTN,GERD, substance use, hyperlipidemia, obesity, statin myopathy, uterine leiomyoma who presents with shortness of breath that is started since 2/9, progressively worsening despite using inhaler and nebulizer at home.  She has oxygen at home at 4 L during nighttime and also daytime as needed. ED Course: Vital signs in the ED significant for initial temperature 102.4, heart rate initially 120s improved to the 90s, respiratory rate in the 20s initially on 15 L now weaned to 2 L, blood pressure in the 81X to 914N systolic.  Lab work-up included CMP with glucose 112.  CBC was normal.  Lactic acid normal with repeat pending.  Respiratory panel for flu and COVID-negative.  VBG with normal pH and PCO2.  Blood cultures pending.  Chest x-ray without acute abnormality but did note that there was some limitations in the study due to it being a portable film that was somewhat underpenetrated.  Patient received Toradol, Solu-Medrol, magnesium, albuterol, IV fluids in the ED.  Patient initially improved some with treatment and attempted to walk afterwards and immediately became tachycardic, tachypneic and desaturated.     Subjective: Seen and examined.  She complains of mild shortness of breath but overall feels better compared to yesterday.  Has diminished breath sounds bilaterally with wheezing. Has not ambulated yet.  Assessment and Plan: * COPD exacerbation ([place])- (present on admission) Patient still with significant wheezing and also diminished breath overall symptoms are improving.  I am going to keep on IV steroid for next 24 hours continue bronchodilators supplemental oxygen, Dulera, Singulair Incruse Ellipta.  Acute on chronic respiratory failure with hypoxia (HCC) Patient uses home oxygen 4 L during the  night and sometimes as needed.  Here patient was hypoxic.  Continue oxygen wean as tolerated continue COPD management  History of substance abuse ([place])- (present on admission) Follow-up with PCP  Hyperlipidemia- (present on admission) Not on statin.  Follow-up with PCP  CAD (coronary artery disease)- (present on admission) No chest pain  Obesity (BMI 30-39.9)- (present on admission) Will benefit with PCP follow-up, weight loss  healthy lifestyle and outpatient sleep evaluation.  GERD (gastroesophageal reflux disease)- (present on admission) Continue Pepcid  DVT prophylaxis: enoxaparin (LOVENOX) injection 40 mg Start: 06/26/21 1000 Code Status:   Code Status: Full Code Family Communication: plan of care discussed with patient at bedside.  Disposition: Currently not medically stable for discharge. Status is: Observation The patient will require care spanning > 2 midnights for ongoing management of COPD exacerbation with systemic steroids supplemental oxygen bronchodilators.    Objective: Vitals last 24 hrs: Vitals:   06/26/21 0600 06/26/21 0730 06/26/21 0808 06/26/21 1045  BP: 124/78 118/90  112/80  Pulse: 84 85  84  Resp: (!) 25 19  (!) 26  Temp: 98.9 F (37.2 C)     TempSrc: Oral     SpO2: 98% 96% 96% 99%  Weight:      Height:       Weight change:   Physical Examination:  General exam: AA,older than stated age, weak appearing. HEENT:Oral mucosa moist, Ear/Nose WNL grossly, dentition normal. Respiratory system: bilaterally diminished with inspiratory and expiratory wheezing, no use of accessory muscle Cardiovascular system: S1 & S2 +, No JVD,. Gastrointestinal system: Abdomen soft,NT,ND, BS+ Nervous System:Alert, awake, moving extremities and grossly nonfocal Extremities: LE edema none,distal peripheral pulses palpable.  Skin: No rashes,no icterus. MSK:  Normal muscle bulk,tone, power  Medications reviewed:  Scheduled Meds:  enoxaparin (LOVENOX) injection  40 mg  Subcutaneous Q24H   famotidine  20 mg Oral QHS   ipratropium-albuterol  3 mL Nebulization TID   methylPREDNISolone (SOLU-MEDROL) injection  40 mg Intravenous Q8H   mometasone-formoterol  2 puff Inhalation BID   montelukast  10 mg Oral QHS   sodium chloride flush  3 mL Intravenous Q12H   umeclidinium bromide  1 puff Inhalation Daily   Continuous Infusions:    Diet Order             Diet Heart Room service appropriate? Yes; Fluid consistency: Thin  Diet effective now                           No intake or output data in the 24 hours ending 06/26/21 1124 Net IO Since Admission: No IO data has been entered for this period [06/26/21 1124]  Wt Readings from Last 3 Encounters:  06/25/21 81.2 kg  06/01/21 82.8 kg  04/26/21 81.6 kg     Unresulted Labs (From admission, onward)     Start     Ordered   07/02/21 0500  Creatinine, serum  (enoxaparin (LOVENOX)    CrCl >/= 30 ml/min)  Weekly,   R     Comments: while on enoxaparin therapy    06/25/21 2223   06/25/21 2219  Respiratory (~20 pathogens) panel by PCR  (COPD / Pneumonia / Cellulitis / Lower Extremity Wound)  Add-on,   AD        06/25/21 2223   06/25/21 1810  Culture, blood (routine x 2)  BLOOD CULTURE X 2,   STAT      06/25/21 1810          Data Reviewed: I have personally reviewed following labs and imaging studies CBC: Recent Labs  Lab 06/25/21 1827 06/26/21 0500  WBC 9.1 7.1  NEUTROABS 6.3  --   HGB 13.0 11.6*  HCT 40.0 36.2  MCV 89.9 90.7  PLT 317 623   Basic Metabolic Panel: Recent Labs  Lab 06/25/21 1827 06/26/21 0500  NA 136 136  K 4.2 4.0  CL 99 104  CO2 26 24  GLUCOSE 112* 156*  BUN 9 10  CREATININE 0.78 0.52  CALCIUM 9.4 8.5*   GFR: Estimated Creatinine Clearance: 72.6 mL/min (by C-G formula based on SCr of 0.52 mg/dL). Liver Function Tests: Recent Labs  Lab 06/25/21 1827 06/26/21 0500  AST 19 18  ALT 16 14  ALKPHOS 68 53  BILITOT 0.7 0.2*  PROT 8.0 6.6  ALBUMIN 4.3 3.4*    No results for input(s): LIPASE, AMYLASE in the last 168 hours. No results for input(s): AMMONIA in the last 168 hours. Coagulation Profile: No results for input(s): INR, PROTIME in the last 168 hours. Cardiac Enzymes: No results for input(s): CKTOTAL, CKMB, CKMBINDEX, TROPONINI in the last 168 hours. BNP (last 3 results) No results for input(s): PROBNP in the last 8760 hours. HbA1C: No results for input(s): HGBA1C in the last 72 hours. CBG: No results for input(s): GLUCAP in the last 168 hours. Lipid Profile: No results for input(s): CHOL, HDL, LDLCALC, TRIG, CHOLHDL, LDLDIRECT in the last 72 hours. Thyroid Function Tests: No results for input(s): TSH, T4TOTAL, FREET4, T3FREE, THYROIDAB in the last 72 hours. Anemia Panel: No results for input(s): VITAMINB12, FOLATE, FERRITIN, TIBC, IRON, RETICCTPCT in the last 72 hours. Sepsis Labs: Recent Labs  Lab 06/25/21  1815  LATICACIDVEN 0.9    Recent Results (from the past 240 hour(s))  Resp Panel by RT-PCR (Flu A&B, Covid) Nasopharyngeal Swab     Status: None   Collection Time: 06/25/21  6:09 PM   Specimen: Nasopharyngeal Swab; Nasopharyngeal(NP) swabs in vial transport medium  Result Value Ref Range Status   SARS Coronavirus 2 by RT PCR NEGATIVE NEGATIVE Final    Comment: (NOTE) SARS-CoV-2 target nucleic acids are NOT DETECTED.  The SARS-CoV-2 RNA is generally detectable in upper respiratory specimens during the acute phase of infection. The lowest concentration of SARS-CoV-2 viral copies this assay can detect is 138 copies/mL. A negative result does not preclude SARS-Cov-2 infection and should not be used as the sole basis for treatment or other patient management decisions. A negative result may occur with  improper specimen collection/handling, submission of specimen other than nasopharyngeal swab, presence of viral mutation(s) within the areas targeted by this assay, and inadequate number of viral copies(<138 copies/mL). A  negative result must be combined with clinical observations, patient history, and epidemiological information. The expected result is Negative.  Fact Sheet for Patients:  EntrepreneurPulse.com.au  Fact Sheet for Healthcare Providers:  IncredibleEmployment.be  This test is no t yet approved or cleared by the [place] FDA and  has been authorized for detection and/or diagnosis of SARS-CoV-2 by FDA under an Emergency Use Authorization (EUA). This EUA will remain  in effect (meaning this test can be used) for the duration of the COVID-19 declaration under Section 564(b)(1) of the Act, 21 U.S.C.section 360bbb-3(b)(1), unless the authorization is terminated  or revoked sooner.       Influenza A by PCR NEGATIVE NEGATIVE Final   Influenza B by PCR NEGATIVE NEGATIVE Final    Comment: (NOTE) The Xpert Xpress SARS-CoV-2/FLU/RSV plus assay is intended as an aid in the diagnosis of influenza from Nasopharyngeal swab specimens and should not be used as a sole basis for treatment. Nasal washings and aspirates are unacceptable for Xpert Xpress SARS-CoV-2/FLU/RSV testing.  Fact Sheet for Patients: EntrepreneurPulse.com.au  Fact Sheet for Healthcare Providers: IncredibleEmployment.be  This test is not yet approved or cleared by the [place] FDA and has been authorized for detection and/or diagnosis of SARS-CoV-2 by FDA under an Emergency Use Authorization (EUA). This EUA will remain in effect (meaning this test can be used) for the duration of the COVID-19 declaration under Section 564(b)(1) of the Act, 21 U.S.C. section 360bbb-3(b)(1), unless the authorization is terminated or revoked.  Performed at [facility], [place] [address] [place], [place] [address]     Antimicrobials: Anti-infectives (From admission, onward)    None      Culture/Microbiology    Component Value Date/Time    SDES  04/09/2019 1711    RIGHT ANTECUBITAL Performed at [facility], [place] [address] [place], [place] [address]    [place]  04/09/2019 1711    BOTTLES DRAWN AEROBIC AND ANAEROBIC Blood Culture adequate volume Performed at [facility] [address], [place], [place] [address]    CULT  04/09/2019 1711    NO GROWTH 5 DAYS Performed at [facility], [place] [address], [place], [place] [address]    REPTSTATUS 04/14/2019 FINAL 04/09/2019 1711    Other culture-see note  Radiology Studies: DG Chest Port 1 View  Result Date: 06/25/2021 CLINICAL DATA:  Shortness of breath and fever.  Audible wheezing. EXAM: PORTABLE CHEST 1 VIEW COMPARISON:  06/01/2021 FINDINGS: Artifact overlies the chest. Heart size  is normal. Mild tortuosity of the aorta. The upper lungs are clear. Chronic scarring in the right mid lung. Lung bases are poorly penetrated but are probably clear. No visible effusion. IMPRESSION: Portable AP film, somewhat under penetrated. No active disease identified. If concern persists, consider two-view chest radiography. Electronically Signed   By: Nelson Chimes M.D.   On: 06/25/2021 19:08     LOS: 0 days   Antonieta Pert, MD [facility]  06/26/2021, 11:24 AM

## 2021-06-27 MED ORDER — BREZTRI AEROSPHERE 160-9-4.8 MCG/ACT IN AERO: INHALATION_SPRAY | RESPIRATORY_TRACT | 0 refills | Status: DC

## 2021-06-27 MED ORDER — PREDNISONE 10 MG PO TABS: ORAL_TABLET | ORAL | 0 refills | Status: DC

## 2021-06-27 NOTE — Discharge Summary (Signed)
Physician Discharge Summary   Patient: [name] MRN: [id number] DOB: 19-Apr-1961  Admit date:     06/25/2021  Discharge date: 06/27/21  Discharge Physician: Antonieta Pert   PCP: Benito Mccreedy, MD   Recommendations at discharge:   Pcp in 1 week  Discharge Diagnoses: Principal Problem:   COPD exacerbation ([place]) Active Problems:   GERD (gastroesophageal reflux disease)   Obesity (BMI 30-39.9)   CAD (coronary artery disease)   Hyperlipidemia   History of substance abuse ([place])   Acute on chronic respiratory failure with hypoxia (HCC)  Resolved Problems:   * No resolved hospital problems. [facility] Course: 61 y.o. female with COPD,CAD,HTN,GERD, substance use, hyperlipidemia, obesity, statin myopathy, uterine leiomyoma who presents with shortness of breath that is started since 2/9, progressively worsening despite using inhaler and nebulizer at home.  She has oxygen at home at 4 L during nighttime and also daytime as needed. ED Course: Vital signs in the ED significant for initial temperature 102.4, heart rate initially 120s improved to the 90s, respiratory rate in the 20s initially on 15 L now weaned to 2 L, blood pressure in the 42V to 956L systolic.  Lab work-up included CMP with glucose 112.  CBC was normal.  Lactic acid normal with repeat pending.  Respiratory panel for flu and COVID-negative.  VBG with normal pH and PCO2.  Blood cultures pending.  Chest x-ray without acute abnormality but did note that there was some limitations in the study due to it being a portable film that was somewhat underpenetrated.  Patient received Toradol, Solu-Medrol, magnesium, albuterol, IV fluids in the ED.  Patient initially improved some with treatment and attempted to walk afterwards and immediately became tachycardic, tachypneic and desaturated. At this time respiratory status improved with IV steroids bronchodilators.  She has been ambulating in the hallway without any issues.  She will be  discharged home on oral prednisone.  She already has oxygen at home.  Assessment and Plan: * COPD exacerbation ([place])- (present on admission) Respiratory status improved with IV Solu-Medrol bronchodilator therapy and home inhalers.  Ambulating well on home oxygen setting.  She will be discharged home on oral steroid taper and she will follow-up with PCP in a week.    Acute on chronic respiratory failure with hypoxia (HCC) Patient uses home oxygen 4 L during the night and sometimes as needed.  Remains stable at home setting.  Improved.   History of substance abuse ([place])- (present on admission) Follow-up with PCP  Hyperlipidemia- (present on admission) Not on statin.  Follow-up with PCP  CAD (coronary artery disease)- (present on admission) No chest pain  Obesity (BMI 30-39.9)- (present on admission) Will benefit with PCP follow-up, weight loss  healthy lifestyle and outpatient sleep evaluation.  GERD (gastroesophageal reflux disease)- (present on admission) Continue Pepcid       Consultants: none Procedures performed: none  Disposition: home Diet recommendation:  Diet Orders (From admission, onward)     Start     Ordered   06/25/21 2223  Diet Heart Room service appropriate? Yes; Fluid consistency: Thin  Diet effective now       Question Answer Comment  Room service appropriate? Yes   Fluid consistency: Thin      06/25/21 2223             DISCHARGE MEDICATION: Allergies as of 06/27/2021       Reactions   Amoxicillin Shortness Of Breath   Tylenol [acetaminophen] Shortness Of Breath   Statins  Zetia [ezetimibe]         Medication List     STOP taking these medications    azithromycin 250 MG tablet Commonly known as: ZITHROMAX       TAKE these medications    albuterol 108 (90 Base) MCG/ACT inhaler Commonly known as: VENTOLIN HFA INHALE 2 PUFFS BY MOUTH INTO THE LUNGS EVERY 4 HOURS AS NEEDED FOR WHEEZE OR FOR SHORTNESS OF BREATH What changed: See  the new instructions.   albuterol (2.5 MG/3ML) 0.083% nebulizer solution Commonly known as: PROVENTIL USE 1 VIAL IN NEBULIZER EVERY 6 HOURS - as needed for wheezing or shortness of breath What changed: See the new instructions.   Breztri Aerosphere 160-9-4.8 MCG/ACT Aero Generic drug: Budeson-Glycopyrrol-Formoterol TAKE 2 PUFFS BY MOUTH TWICE A DAY   D-5000 125 MCG (5000 UT) Tabs Generic drug: Cholecalciferol Take 5,000 Units by mouth daily.   famotidine 20 MG tablet Commonly known as: PEPCID TAKE 1 TABLET BY MOUTH EVERYDAY AT BEDTIME What changed: See the new instructions.   ibuprofen 200 MG tablet Commonly known as: ADVIL Take 400 mg by mouth every 6 (six) hours as needed for fever, headache or mild pain.   montelukast 10 MG tablet Commonly known as: SINGULAIR Take 1 tablet (10 mg total) by mouth at bedtime.   nitroGLYCERIN 0.4 MG SL tablet Commonly known as: NITROSTAT Place 1 tablet (0.4 mg total) under the tongue every 5 (five) minutes as needed for chest pain.   OXYGEN Inhale 3 L into the lungs at bedtime.   Praluent 150 MG/ML [place] Generic drug: Alirocumab INJECT 150 MG INTO THE SKIN EVERY 14 (FOURTEEN) DAYS.   predniSONE 10 MG tablet Commonly known as: DELTASONE Take PO 4 tabs daily x 2 days,3 tabs daily x 2 days,2 tabs daily x 2 days,1 tab daily x 2 days then stop. What changed:  medication strength additional instructions        Follow-up Information     Osei-Bonsu, Iona Beard, MD Follow up in 1 week(s).   Specialty: Internal Medicine Contact information: [id number] [id number] [phone]                 Discharge Exam: Filed Weights   06/25/21 1810 06/26/21 1650  Weight: 81.2 kg 81.3 kg  Condition at discharge: stable  The results of significant diagnostics from this hospitalization (including imaging, microbiology, ancillary and laboratory) are listed below for reference.   Imaging Studies: DG Chest 2 View  Result  Date: 06/01/2021 CLINICAL DATA:  Wheezing EXAM: CHEST - 2 VIEW COMPARISON:  Chest radiograph 03/18/2021, CT chest 04/19/2021 FINDINGS: The cardiomediastinal silhouette is normal. There is no focal consolidation or pulmonary edema. Coarse interstitial markings primarily in the right lung are not significantly changed likely reflecting scar. The lungs are hyperinflated consistent with underlying emphysema. There is no pleural effusion or pneumothorax. There is no acute osseous abnormality. IMPRESSION: Stable chest with no radiographic evidence of acute cardiopulmonary process. Electronically Signed   By: Valetta Mole M.D.   On: 06/01/2021 14:50   DG Chest Port 1 View  Result Date: 06/25/2021 CLINICAL DATA:  Shortness of breath and fever.  Audible wheezing. EXAM: PORTABLE CHEST 1 VIEW COMPARISON:  06/01/2021 FINDINGS: Artifact overlies the chest. Heart size is normal. Mild tortuosity of the aorta. The upper lungs are clear. Chronic scarring in the right mid lung. Lung bases are poorly penetrated but are probably clear. No visible effusion. IMPRESSION: Portable AP film, somewhat under penetrated. No active disease identified.  If concern persists, consider two-view chest radiography. Electronically Signed   By: Nelson Chimes M.D.   On: 06/25/2021 19:08    Microbiology: Results for orders placed or performed during the hospital encounter of 06/25/21  Culture, blood (routine x 2)     Status: None (Preliminary result)   Collection Time: 06/25/21  6:07 PM   Specimen: BLOOD  Result Value Ref Range Status   Specimen Description   Final    BLOOD LEFT ANTECUBITAL Performed at [facility] [address] [place], [place] [address]    Special Requests   Final    BOTTLES DRAWN AEROBIC AND ANAEROBIC Blood Culture results may not be optimal due to an excessive volume of blood received in culture bottles Performed at [facility] [address] [place], [place] [address]     Culture   Final    NO GROWTH 1 DAY Performed at [facility], [place] [address] [place], [place] [address]    Report Status PENDING  Incomplete  Resp Panel by RT-PCR (Flu A&B, Covid) Nasopharyngeal Swab     Status: None   Collection Time: 06/25/21  6:09 PM   Specimen: Nasopharyngeal Swab; Nasopharyngeal(NP) swabs in vial transport medium  Result Value Ref Range Status   SARS Coronavirus 2 by RT PCR NEGATIVE NEGATIVE Final    Comment: (NOTE) SARS-CoV-2 target nucleic acids are NOT DETECTED.  The SARS-CoV-2 RNA is generally detectable in upper respiratory specimens during the acute phase of infection. The lowest concentration of SARS-CoV-2 viral copies this assay can detect is 138 copies/mL. A negative result does not preclude SARS-Cov-2 infection and should not be used as the sole basis for treatment or other patient management decisions. A negative result may occur with  improper specimen collection/handling, submission of specimen other than nasopharyngeal swab, presence of viral mutation(s) within the areas targeted by this assay, and inadequate number of viral copies(<138 copies/mL). A negative result must be combined with clinical observations, patient history, and epidemiological information. The expected result is Negative.  Fact Sheet for Patients:  EntrepreneurPulse.com.au  Fact Sheet for Healthcare Providers:  IncredibleEmployment.be  This test is no t yet approved or cleared by the [place] FDA and  has been authorized for detection and/or diagnosis of SARS-CoV-2 by FDA under an Emergency Use Authorization (EUA). This EUA will remain  in effect (meaning this test can be used) for the duration of the COVID-19 declaration under Section 564(b)(1) of the Act, 21 U.S.C.section 360bbb-3(b)(1), unless the authorization is terminated  or revoked sooner.       Influenza A by PCR NEGATIVE NEGATIVE Final   Influenza B by PCR  NEGATIVE NEGATIVE Final    Comment: (NOTE) The Xpert Xpress SARS-CoV-2/FLU/RSV plus assay is intended as an aid in the diagnosis of influenza from Nasopharyngeal swab specimens and should not be used as a sole basis for treatment. Nasal washings and aspirates are unacceptable for Xpert Xpress SARS-CoV-2/FLU/RSV testing.  Fact Sheet for Patients: EntrepreneurPulse.com.au  Fact Sheet for Healthcare Providers: IncredibleEmployment.be  This test is not yet approved or cleared by the [place] FDA and has been authorized for detection and/or diagnosis of SARS-CoV-2 by FDA under an Emergency Use Authorization (EUA). This EUA will remain in effect (meaning this test can be used) for the duration of the COVID-19 declaration under Section 564(b)(1) of the Act, 21 U.S.C. section 360bbb-3(b)(1), unless the authorization is terminated or revoked.  Performed at [facility], [place] [address]  [place], [place] [address]   Culture, blood (routine x 2)     Status: None (Preliminary result)   Collection Time: 06/25/21  6:15 PM   Specimen: BLOOD  Result Value Ref Range Status   Specimen Description   Final    BLOOD BLOOD LEFT FOREARM Performed at [facility] [address], [place], [place] [address]    Special Requests   Final    BOTTLES DRAWN AEROBIC AND ANAEROBIC Blood Culture adequate volume Performed at [facility] [address] [place], [place] [address]    Culture   Final    NO GROWTH 1 DAY Performed at [facility], [place] [address] [place], [place] [address]    Report Status PENDING  Incomplete    Labs: CBC: Recent Labs  Lab 06/25/21 1827 06/26/21 0500  WBC 9.1 7.1  NEUTROABS 6.3  --   HGB 13.0 11.6*  HCT 40.0 36.2  MCV 89.9 90.7  PLT 317 614   Basic Metabolic Panel: Recent Labs  Lab 06/25/21 1827 06/26/21 0500  NA 136 136  K 4.2 4.0  CL 99 104  CO2 26 24   GLUCOSE 112* 156*  BUN 9 10  CREATININE 0.78 0.52  CALCIUM 9.4 8.5*   Liver Function Tests: Recent Labs  Lab 06/25/21 1827 06/26/21 0500  AST 19 18  ALT 16 14  ALKPHOS 68 53  BILITOT 0.7 0.2*  PROT 8.0 6.6  ALBUMIN 4.3 3.4*   CBG: No results for input(s): GLUCAP in the last 168 hours.  Discharge time spent: 25 minutes.  Signed: Antonieta Pert, MD [facility] 06/27/2021

## 2021-06-28 ENCOUNTER — Telehealth: Payer: Self-pay | Admitting: Cardiology

## 2021-06-28 NOTE — Telephone Encounter (Signed)
Pt was seen in the ED on 2/10 and admitted. Pt states she was told to see her cardiologist regarding her heart rate and shortness of breath. Appt has been made.

## 2021-06-28 NOTE — Telephone Encounter (Signed)
Pt c/o Shortness Of Breath: STAT if SOB developed within the last 24 hours or pt is noticeably SOB on the phone  1. Are you currently SOB (can you hear that pt is SOB on the phone)? yes  2. How long have you been experiencing SOB? Friday  3. Are you SOB when sitting or when up moving around? both  4. Are you currently experiencing any other symptoms? Pressure on her chest  I tried calling [facility] 3 times no answer

## 2021-06-28 NOTE — Telephone Encounter (Signed)
Called patient to get more information. Patient explained that she was just in the ER on Friday regarding her COPD and her heart rate was running low in the 40's and she was instructed to let Dr. Agustin Cree know. Today the patient called the [facility] and left a message that she was short of breath since Friday and she had been having chest pressure like someone was sitting on her chest when she is sitting and also when she is up moving around. Due to the patient having chest pressure I advised her to go to the ER to have her symptoms evaluated. She kept asking me why she needed to go to the ER when she was just there. I explained that her symptom of chest pressure was new and more urgent and she needed to have it evaluated in the ER. Patient said ok and hung up the phone.

## 2021-06-30 ENCOUNTER — Encounter: Payer: Self-pay | Admitting: Cardiology

## 2021-06-30 ENCOUNTER — Other Ambulatory Visit: Payer: Self-pay

## 2021-06-30 ENCOUNTER — Ambulatory Visit: Payer: BC Managed Care – PPO | Admitting: Cardiology

## 2021-06-30 VITALS — BP 156/88 | HR 85 | Ht 63.0 in | Wt 183.0 lb

## 2021-06-30 DIAGNOSIS — Q245 Malformation of coronary vessels: Secondary | ICD-10-CM

## 2021-06-30 DIAGNOSIS — Z789 Other specified health status: Secondary | ICD-10-CM

## 2021-06-30 DIAGNOSIS — J449 Chronic obstructive pulmonary disease, unspecified: Secondary | ICD-10-CM | POA: Diagnosis not present

## 2021-06-30 DIAGNOSIS — I2510 Atherosclerotic heart disease of native coronary artery without angina pectoris: Secondary | ICD-10-CM | POA: Diagnosis not present

## 2021-06-30 DIAGNOSIS — E7800 Pure hypercholesterolemia, unspecified: Secondary | ICD-10-CM

## 2021-06-30 MED ORDER — ISOSORBIDE MONONITRATE ER 30 MG PO TB24: 30.0000 mg | ORAL_TABLET | Freq: Every day | ORAL | 3 refills | Status: DC
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
## 2021-06-30 NOTE — Patient Instructions (Signed)
Medication Instructions:  Your physician has recommended you make the following change in your medication:  START: Imdur 30 mg take one tablet by mouth daily.  *If you need a refill on your cardiac medications before your next appointment, please call your pharmacy*   Lab Work: None If you have labs (blood work) drawn today and your tests are completely normal, you will receive your results only by: [place] (if you have MyChart) OR A paper copy in the mail If you have any lab test that is abnormal or we need to change your treatment, we will call you to review the results.   Testing/Procedures: None   Follow-Up: At [facility], you and your health needs are our priority.  As part of our continuing mission to provide you with exceptional heart care, we have created designated Provider Care Teams.  These Care Teams include your primary Cardiologist (physician) and Advanced Practice Providers (APPs -  Physician Assistants and Nurse Practitioners) who all work together to provide you with the care you need, when you need it.  We recommend signing up for the patient portal called "MyChart".  Sign up information is provided on this After Visit Summary.  MyChart is used to connect with patients for Virtual Visits (Telemedicine).  Patients are able to view lab/test results, encounter notes, upcoming appointments, etc.  Non-urgent messages can be sent to your provider as well.   To learn more about what you can do with MyChart, go to [email or web address].    Your next appointment:   9 month(s)  The format for your next appointment:   In Person  Provider:   Shirlee More, MD    Other Instructions         Mediterranean diet: A heart-healthy eating plan The heart-healthy Mediterranean diet is a healthy eating plan based on typical foods and recipes of Mediterranean-style cooking. Here's how to adopt the Mediterranean diet. By [facility] Staff  If you're looking for a  heart-healthy eating plan, the Mediterranean diet might be right for you. The Mediterranean diet incorporates the basics of healthy eating -- plus a splash of flavorful olive oil and perhaps a glass of red wine -- among other components characterizing the traditional cooking style of countries bordering the [employer]. Most healthy diets include fruits, vegetables, fish and whole grains, and limit unhealthy fats. While these parts of a healthy diet are tried-and-true, subtle variations or differences in proportions of certain foods may make a difference in your risk of heart disease.  Benefits of the [place] has shown that the traditional Mediterranean diet reduces the risk of heart disease. The diet has been associated with a lower level of oxidized low-density lipoprotein (LDL) cholesterol -- the "bad" cholesterol that's more likely to build up deposits in your arteries. In fact, a meta-analysis of more than 1.5 million healthy adults demonstrated that following a Mediterranean diet was associated with a reduced risk of cardiovascular mortality as well as overall mortality. The Mediterranean diet is also associated with a reduced incidence of cancer, and Parkinson's and Alzheimer's diseases. Women who eat a Mediterranean diet supplemented with extra-virgin olive oil and mixed nuts may have a reduced risk of breast cancer. For these reasons, most if not all major scientific organizations encourage healthy adults to adapt a style of eating like that of the [place] for prevention of major chronic diseases.  Key components of the Mediterranean diet The Mediterranean diet emphasizes: Eating primarily plant-based foods, such as fruits and  vegetables, whole grains, legumes and nuts  Replacing butter with healthy fats such as olive oil and canola oil  Using herbs and spices instead of salt to flavor foods  Limiting red meat to no more than a few times a month  Eating  fish and poultry at least twice a week  Enjoying meals with family and friends  Drinking red wine in moderation (optional)  Getting plenty of exercise Fruits, vegetables, nuts and grains  The Mediterranean diet traditionally includes fruits, vegetables, pasta and rice. For example, residents of [place] eat very little red meat and average nine servings a day of antioxidant-rich fruits and vegetables.  Grains in the [place] region are typically whole grain and usually contain very few unhealthy trans fats, and bread is an important part of the diet there. However, throughout the [place] region, bread is eaten plain or dipped in olive oil -- not eaten with butter or margarines, which contain saturated or trans fats.  Nuts are another part of a healthy Mediterranean diet. Nuts are high in fat (approximately 80 percent of their calories come from fat), but most of the fat is not saturated. Because nuts are high in calories, they should not be eaten in large amounts -- generally no more than a handful a day. Avoid candied or honey-roasted and heavily salted nuts.  Healthy fats The focus of the Mediterranean diet isn't on limiting total fat consumption, but rather to make wise choices about the types of fat you eat. The Mediterranean diet discourages saturated fats and hydrogenated oils (trans fats), both of which contribute to heart disease. The Mediterranean diet features olive oil as the primary source of fat. Olive oil provides monounsaturated fat -- a type of fat that can help reduce LDL cholesterol levels when used in place of saturated or trans fats.  "Extra-virgin" and "virgin" olive oils -- the least processed forms -- also contain the highest levels of the protective plant compounds that provide antioxidant effects. Monounsaturated fats and polyunsaturated fats, such as canola oil and some nuts, contain the beneficial linolenic acid (a type of omega-3 fatty acid). Omega-3 fatty acids  lower triglycerides, decrease blood clotting, are associated with decreased sudden heart attack, improve the health of your blood vessels, and help moderate blood pressure. Fatty fish -- such as mackerel, lake trout, herring, sardines, albacore tuna and salmon -- are rich sources of omega-3 fatty acids. Fish is eaten on a regular basis in the [place].  Wine The health effects of alcohol have been debated for many years, and some doctors are reluctant to encourage alcohol consumption because of the health consequences of excessive drinking. However, alcohol -- in moderation -- has been associated with a reduced risk of heart disease in some research studies. The Mediterranean diet typically includes a moderate amount of wine. This means no more than 5 ounces (148 milliliters) of wine daily for women (or men over age 68), and no more than 10 ounces (296 milliliters) of wine daily for men under age 43. If you're unable to limit your alcohol intake to the amounts defined above, if you have a personal or family history of alcohol abuse, or if you have heart or liver disease, refrain from drinking wine or any other alcohol.  Putting it all together The Mediterranean diet is a delicious and healthy way to eat. Many people who switch to this style of eating say they'll never eat any other way. Here are some specific steps to get you started: Eat your  veggies and fruits -- and switch to whole grains. An abundance and variety of plant foods should make up the majority of your meals. Strive for seven to 10 servings a day of veggies and fruits. Switch to whole-grain bread and cereal, and begin to eat more whole-grain rice and pasta products.  Go nuts. Keep almonds, cashews, pistachios and walnuts on hand for a quick snack. Choose natural peanut butter, rather than the kind with hydrogenated fat added. Try tahini (blended sesame seeds) as a dip or spread for bread.  Pass on the butter. Try olive or canola  oil as a healthy replacement for butter or margarine. Use it in cooking. Dip bread in flavored olive oil or lightly spread it on whole-grain bread for a tasty alternative to butter. Or try tahini as a dip or spread.  Spice it up. Herbs and spices make food tasty and are also rich in health-promoting substances. Season your meals with herbs and spices rather than salt.  Go fish. Eat fish once or twice a week. Fresh or water-packed tuna, salmon, trout, mackerel and herring are healthy choices. Grilled fish tastes good and requires little cleanup. Avoid fried fish, unless it's sauteed in a small amount of canola oil.  Rein in the red meat. Substitute fish and poultry for red meat. When eaten, make sure it's lean and keep portions small (about the size of a deck of cards). Also avoid sausage, bacon and other high-fat meats.  Choose low-fat dairy. Limit higher fat dairy products such as whole or 2 percent milk, cheese and ice cream. Switch to skim milk, fat-free yogurt and low-fat cheese.  Raise a glass to healthy eating. If it's OK with your doctor, have a glass of wine at dinner. If you don't drink alcohol, you don't need to start. Drinking purple grape juice may be an alternative to wine.

## 2021-07-01 LAB — CULTURE, BLOOD (ROUTINE X 2)
Culture: NO GROWTH
Culture: NO GROWTH
Special Requests: ADEQUATE

## 2021-07-02 NOTE — Telephone Encounter (Signed)
Looks like encounter was open in error so closing encounter. 

## 2021-07-06 ENCOUNTER — Other Ambulatory Visit: Payer: Self-pay

## 2021-07-06 ENCOUNTER — Encounter: Payer: Self-pay | Admitting: Pulmonary Disease

## 2021-07-06 ENCOUNTER — Ambulatory Visit: Payer: BC Managed Care – PPO | Admitting: Pulmonary Disease

## 2021-07-06 VITALS — BP 136/72 | HR 85 | Ht 63.0 in | Wt 179.0 lb

## 2021-07-06 DIAGNOSIS — J449 Chronic obstructive pulmonary disease, unspecified: Secondary | ICD-10-CM | POA: Diagnosis not present

## 2021-07-06 DIAGNOSIS — R042 Hemoptysis: Secondary | ICD-10-CM | POA: Diagnosis not present

## 2021-07-06 NOTE — Patient Instructions (Signed)
You are due for a follow-up CT of scan of the chest in the next 1 to 2 weeks.  I will review this when available and decide if you need to proceed with a bronchoscope Follow-up in 2 months.

## 2021-07-06 NOTE — Progress Notes (Signed)
[name]    [id number]    April 16, 1961  Primary Care Physician:Osei-Bonsu, Iona Beard, MD  Referring Physician: Benito Mccreedy, MD [id number] [place],  [place] [address]  Chief complaint:  Follow-up for  Severe COPD  HPI: 60 year old smoker with very severe COPD, chronic hypoxic respiratory failure.  She is previously followed by Dr. Ashok Cordia.  Pulmicort and Perforomist were ordered last year but insurance would not cover She was then placed on Symbicort in 2018 but had worsening symptoms of dyspnea, wheeze. Inhalers were switched to Trelegy at office visit in March 2019.    Pets: No pets, birds, farm animals Occupation: Previously worked doing Environmental manager jobs and [employer]  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 Exposures: No mold, hot tub, Jacuzzi. Smoking history: 35-pack-year smoker.  Quit in 2011 Travel history: No significant travel history Relevant family history: No significant family history of lung disease.  Interim history: She was treated for COPD exacerbation around late November 2022.  CT at that time showed some inflammatory changes at the base of the lung Hospitalized for COPD exacerbation in mid February 2023 which was treated with steroids and bronchodilators  Continues to have chronic cough, dyspnea on exertion, intermittent hemoptysis with mucus Plavix has been held by Dr. Bettina Gavia, cardiology  Outpatient Encounter Medications as of 07/06/2021  Medication Sig   albuterol (PROVENTIL) (2.5 MG/3ML) 0.083% nebulizer solution USE 1 VIAL IN NEBULIZER EVERY 6 HOURS - as needed for wheezing or shortness of breath (Patient taking differently: Take 2.5 mg by nebulization every 6 (six) hours as needed for wheezing or shortness of breath.)   albuterol (VENTOLIN HFA) 108 (90 Base) MCG/ACT inhaler INHALE 2 PUFFS BY MOUTH INTO THE LUNGS EVERY 4 HOURS AS NEEDED FOR WHEEZE OR FOR  SHORTNESS OF BREATH (Patient taking differently: Inhale 2 puffs into the lungs every 4 (four) hours as needed for wheezing or shortness of breath.)   Alirocumab (PRALUENT) 150 MG/ML SOAJ INJECT 150 MG INTO THE SKIN EVERY 14 (FOURTEEN) DAYS.   Budeson-Glycopyrrol-Formoterol (BREZTRI AEROSPHERE) 160-9-4.8 MCG/ACT AERO TAKE 2 PUFFS BY MOUTH TWICE A DAY   famotidine (PEPCID) 20 MG tablet TAKE 1 TABLET BY MOUTH EVERYDAY AT BEDTIME (Patient taking differently: Take 20 mg by mouth at bedtime.)   ibuprofen (ADVIL) 200 MG tablet Take 400 mg by mouth every 6 (six) hours as needed for fever, headache or mild pain.   isosorbide mononitrate (IMDUR) 30 MG 24 hr tablet Take 1 tablet (30 mg total) by mouth daily.   montelukast (SINGULAIR) 10 MG tablet Take 1 tablet (10 mg total) by mouth at bedtime.   nitroGLYCERIN (NITROSTAT) 0.4 MG SL tablet Place 1 tablet (0.4 mg total) under the tongue every 5 (five) minutes as needed for chest pain.   OXYGEN Inhale 3 L into the lungs at bedtime.    [DISCONTINUED] predniSONE (DELTASONE) 10 MG tablet Take PO 4 tabs daily x 2 days,3 tabs daily x 2 days,2 tabs daily x 2 days,1 tab daily x 2 days then stop.   No facility-administered encounter medications on file as of 07/06/2021.   Physical Exam: Blood pressure 136/72, pulse 85, height 5\' 3"  (1.6 m), weight 179 lb (81.2 kg), SpO2 90 %. Gen:      No acute distress HEENT:  EOMI, sclera anicteric Neck:     No masses; no thyromegaly Lungs:    Clear to auscultation bilaterally;  normal respiratory effort CV:         Regular rate and rhythm; no murmurs Abd:      + bowel sounds; soft, non-tender; no palpable masses, no distension Ext:    No edema; adequate peripheral perfusion Skin:      Warm and dry; no rash Neuro: alert and oriented x 3 Psych: normal mood and affect   Data Reviewed: Imaging: Low-dose screening CT 04/19/2021-new areas of bilateral subpleural groundglass consolidation in the lower lobes, coronary artery  calcification, enlarged PA, emphysema.  Chest x-ray 06/25/2021-no active cardiopulmonary disease. I have reviewed the images personally.  PFT 06/02/17 FVC 1.50 [60%], FEV1 0.79 [40%], F/F 53, TLC 96, RV/TLC 167%, DLCO 32% Severe obstruction with bronchodilator response, air-trapping.  Severe diffusion impairment.  6MWT 11/10/15:  Walked 360 meters / Baseline Sat 96% on RA / Nadir Sat 95% on RA  MICROBIOLOGY Sputum Ctx (11/13/15):  Paecilomyces species / Oral Flora / AFB negative    LABS 03/01/15 HIV:  Negative   09/29/14 ANA:  Negative   05/16/11 Alpha-1 antitrypsin: MM (122)  Cardiac work-up by Dr. Einar Gip Nuclear stress test 11/14/16-very small inferoseptal ischemia, preserved LVEF with mild hypokinesis the same region Echocardiogram shows mild pulmonary hypertension with normal LV systolic function.  Assessment:  Severe COPD. Recent exacerbation of COPD Intermittent hemoptysis Continue breztri, supplemental oxygen at night and with exertion Nebs as needed  We will review follow-up CT which is scheduled for the next 1 to 2 weeks.  Decide on bronchoscopy based on review of film  Pulmonary nodules Recent screening CT in December 2022 reviewed with inflammatory appearing bilateral lower lobe nodules. Treatment for COPD exacerbation as above Follow-up CT ordered which is due in early March.  I will see if he can get this done sooner  Health maintenance Influenza vaccine-states that she got her flu vaccine this year at [facility]. 09/03/2011-Pneumovax  Plan/Recommendations: - Continue breztri - Continue supplemental oxygen - Follow-up CT  Marshell Garfinkel MD [facility] 07/06/2021, 11:36 AM  CC: Benito Mccreedy, MD

## 2021-07-13 ENCOUNTER — Telehealth: Payer: Self-pay | Admitting: Pulmonary Disease

## 2021-07-13 NOTE — Telephone Encounter (Signed)
Received FMLA forms via fax. Sent to [facility] for review.

## 2021-07-14 ENCOUNTER — Ambulatory Visit (INDEPENDENT_AMBULATORY_CARE_PROVIDER_SITE_OTHER)
Admission: RE | Admit: 2021-07-14 | Discharge: 2021-07-14 | Disposition: A | Discharge status: Home / Self Care | Payer: BC Managed Care – PPO | Source: Ambulatory Visit | Attending: Acute Care | Admitting: Acute Care

## 2021-07-14 ENCOUNTER — Other Ambulatory Visit: Payer: Self-pay

## 2021-07-14 DIAGNOSIS — Z87891 Personal history of nicotine dependence: Secondary | ICD-10-CM

## 2021-07-16 ENCOUNTER — Other Ambulatory Visit: Payer: Self-pay | Admitting: Acute Care

## 2021-07-16 ENCOUNTER — Telehealth: Payer: Self-pay | Admitting: Acute Care

## 2021-07-16 DIAGNOSIS — J069 Acute upper respiratory infection, unspecified: Secondary | ICD-10-CM

## 2021-07-16 DIAGNOSIS — R911 Solitary pulmonary nodule: Secondary | ICD-10-CM

## 2021-07-16 DIAGNOSIS — T17800A Unspecified foreign body in other parts of respiratory tract causing asphyxiation, initial encounter: Secondary | ICD-10-CM

## 2021-07-16 MED ORDER — DOXYCYCLINE HYCLATE 100 MG PO TABS: 100.0000 mg | ORAL_TABLET | Freq: Two times a day (BID) | ORAL | 0 refills | Status: DC

## 2021-07-16 NOTE — Telephone Encounter (Signed)
Call report on LDCT 07/14/21- Impression: ? ?IMPRESSION: ?1. New large consolidations of the right middle lobe and right lower ?lobe, potentially due to infection or aspiration. Recommend ?short-term follow-up CT in 1-2 months. Previously described smaller ?nodular opacities are decreased in size, have resolved, or obscured ?by new large consolidations. Lung-RADS 4B, suspicious. Additional ?imaging evaluation or consultation with Pulmonology or Thoracic ?Surgery recommended. ?2. Coronary artery calcifications, aortic Atherosclerosis ?(ICD10-I70.0) and Emphysema (ICD10-J43.9). ?  ?These results will be called to the ordering clinician or ?representative by the Radiologist Assistant, and communication ?documented in the PACS or [employer]. ?  ?  ?Electronically Signed ?  By: Yetta Glassman M.D. ?  On: 07/15/2021 18:01 ?

## 2021-07-16 NOTE — Telephone Encounter (Signed)
I have called the patient with the results of her low dose CT Chest. I explained that her scan was read as a ?Lung RADS 4 B indicates suspicious findings for which additional diagnostic testing and or tissue sampling is recommended.  ?Infiltrates are suspicious for infection or aspiration and a 1-2 month follow up Ct is recommended. ?Pt was in the hospital recently ( 2/10-2/12 ) with a COPD exacerbation. ?Dr. [name], when I spoke with the patient , she told me that you had told her you wanted to do a bronch and biopsy. ?Do you want to do a folow up CT in 1-2 months ( and ABX) or would you rather just do the bronch? She has never had a swallow eval, and she perhaps should. Let me know what you want to do . Thanks ? ?Langley Gauss, please fax orders to PCP and watch for Dr. Harlow Asa response regarding plans for follow up. Thanks ?

## 2021-07-16 NOTE — Telephone Encounter (Signed)
I have called the patient. We are treating with 10 days of Doxycycline 100 mg BID x 10 days, as she is allergic to Augmentin. This prescription has been sent.  ?I have ordered the modified barium swallow, and the 2 month follow up CT Chest.  ?

## 2021-07-16 NOTE — Telephone Encounter (Signed)
Agree with augmentin 875 bid for 14 days, Modified barium swallow eval and follow up CT scan ?

## 2021-07-19 ENCOUNTER — Other Ambulatory Visit (HOSPITAL_COMMUNITY): Payer: Self-pay

## 2021-07-19 DIAGNOSIS — R1310 Dysphagia, unspecified: Secondary | ICD-10-CM

## 2021-07-19 NOTE — Telephone Encounter (Signed)
CT results faxed to PCP with follow up plans included.  ?

## 2021-07-20 ENCOUNTER — Telehealth: Payer: Self-pay | Admitting: Pulmonary Disease

## 2021-07-21 NOTE — Telephone Encounter (Signed)
I have the FMLA paperwork ready for Dr. Matilde Bash review- waiting for his clinic on 3/13. As of now, it has recurring episodes 1-3 times per month lasting 1-3 days. There is a "reduced schedule" section if Dr. [name] approves of work schedule change. ?

## 2021-07-21 NOTE — Telephone Encounter (Signed)
Called and spoke with patient who states that she is finding it hard to stay and work and do the 12 hour shifts that she has been working. Looks like we received FMLA paperwork for patient last week on 07/13/21. Patient wants to know if we can put on the FMLA paperwork or if Dr. [name] could write a letter to her job stating that she can't work until 5:30 and she needs to get off at 2:30 due to her COPD. She goes in at 6 am and 2:30 would put her at 8 hours but she states that she is not able to do the 12 hours anymore.  ? ?Please advise ?

## 2021-07-21 NOTE — Telephone Encounter (Signed)
Tried calling patient and was unable to leave voicemail due to her mail box being full. Will try again  ?

## 2021-07-27 ENCOUNTER — Ambulatory Visit: Payer: BC Managed Care – PPO | Admitting: Cardiology

## 2021-07-30 NOTE — Telephone Encounter (Signed)
Dr. [name] signed FMLA form approving the recurring episodes 1-3 times/month, lasting 1-3 days each.  He documented work schedule to be 8 hrs/day, 5 days/wk and FMLA duration to be 07/29/2021 to [place].  I have faxed form to [employer] - fax# [phone] ?

## 2021-08-04 ENCOUNTER — Ambulatory Visit (HOSPITAL_COMMUNITY)
Admission: RE | Admit: 2021-08-04 | Discharge: 2021-08-04 | Disposition: A | Discharge status: Home / Self Care | Payer: BC Managed Care – PPO | Source: Ambulatory Visit | Attending: Acute Care | Admitting: Acute Care

## 2021-08-04 ENCOUNTER — Telehealth: Payer: Self-pay | Admitting: Acute Care

## 2021-08-04 ENCOUNTER — Other Ambulatory Visit: Payer: Self-pay

## 2021-08-04 DIAGNOSIS — T17800A Unspecified foreign body in other parts of respiratory tract causing asphyxiation, initial encounter: Secondary | ICD-10-CM | POA: Diagnosis present

## 2021-08-04 DIAGNOSIS — R1310 Dysphagia, unspecified: Secondary | ICD-10-CM | POA: Diagnosis present

## 2021-08-04 NOTE — Telephone Encounter (Signed)
-----   Message from Magdalen Spatz, NP sent at 08/04/2021  4:53 PM EDT ----- ?Please call patient and let her know her swallow study showed she has a mild risk of aspiration, which is food going into your lungs instead of your stomach. This is good news. Speech Therapy do not feel you need any therapy for swallow at this time. If patient has any further questions or would like to come into the office for a visit to go over the result, it is fine to schedule her. Thanks so much ? ?

## 2021-08-04 NOTE — Telephone Encounter (Signed)
I called the patient and she voices understanding.  ?

## 2021-08-04 NOTE — Progress Notes (Signed)
Modified Barium Swallow Progress Note ? ?Patient Details  ?Name: [name] ?MRN: [id number] ?Date of Birth: 1960-09-27 ? ?Today's Date: 08/04/2021 ? ?Modified Barium Swallow completed.  Full report located under Chart Review in the Imaging Section. ? ?Brief recommendations include the following: ? ?Clinical Impression ? Pt seen for outpatient MBS with functional oral-motor abilities and volitional cough. Pt consumed thin via cup and straw, solid texture and pill with thin. Oral manipulation and transit was timely and effective. Hyolaryngeal excursion and elevation with within normal limits preventing penetration and aspiration or residue during study. Pill transited esophagus through the GE junction without difficulties noted. With her history of GER reflux precautions were verbally educated. Recommend she continue regular texture, thin liquids without further ST needed. ?  ?Swallow Evaluation Recommendations ? ?   ? ? SLP Diet Recommendations: Regular solids;Thin liquid ? ? Liquid Administration via: Cup;Straw ? ? Medication Administration: Whole meds with liquid ? ? Supervision: Patient able to self feed ? ?   ? ? Postural Changes: Remain semi-upright after after feeds/meals (Comment);Seated upright at 90 degrees ? ? Oral Care Recommendations: Oral care BID ? ?   ? ? ? ?Houston Siren ? ?Orbie Pyo [place].Ed CCC-SLP ?Speech-Language Pathologist ?Office [phone] ? ?08/04/2021,1:35 PM ?

## 2021-08-05 NOTE — Progress Notes (Signed)
These results will be called to the patient by my nirse. There is a separate telephone message with specific recommendations dated 08/04/2021

## 2021-08-06 NOTE — Progress Notes (Signed)
Called previously. Closing encounter.  ?

## 2021-09-01 ENCOUNTER — Encounter: Payer: Self-pay | Admitting: *Deleted

## 2021-09-03 ENCOUNTER — Ambulatory Visit: Payer: BC Managed Care – PPO | Admitting: Pulmonary Disease

## 2021-09-10 ENCOUNTER — Ambulatory Visit: Payer: BC Managed Care – PPO | Admitting: Obstetrics and Gynecology

## 2021-09-10 ENCOUNTER — Encounter: Payer: Self-pay | Admitting: Obstetrics and Gynecology

## 2021-09-10 VITALS — BP 108/71 | HR 85

## 2021-09-10 DIAGNOSIS — N8110 Cystocele, unspecified: Secondary | ICD-10-CM

## 2021-09-10 DIAGNOSIS — N812 Incomplete uterovaginal prolapse: Secondary | ICD-10-CM | POA: Diagnosis not present
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
## 2021-09-11 ENCOUNTER — Encounter: Payer: Self-pay | Admitting: Obstetrics and Gynecology

## 2021-09-28 ENCOUNTER — Other Ambulatory Visit: Payer: Self-pay | Admitting: Pulmonary Disease

## 2021-10-05 ENCOUNTER — Telehealth: Payer: Self-pay | Admitting: Cardiology

## 2021-10-05 ENCOUNTER — Encounter: Payer: Self-pay | Admitting: Pulmonary Disease

## 2021-10-05 ENCOUNTER — Other Ambulatory Visit: Payer: Self-pay | Admitting: *Deleted

## 2021-10-05 ENCOUNTER — Ambulatory Visit: Payer: BC Managed Care – PPO | Admitting: Pulmonary Disease

## 2021-10-05 VITALS — BP 130/72 | HR 79 | Temp 98.4°F | Ht 63.0 in | Wt 168.2 lb

## 2021-10-05 DIAGNOSIS — J449 Chronic obstructive pulmonary disease, unspecified: Secondary | ICD-10-CM | POA: Diagnosis not present

## 2021-10-05 DIAGNOSIS — R0609 Other forms of dyspnea: Secondary | ICD-10-CM

## 2021-10-05 DIAGNOSIS — E7800 Pure hypercholesterolemia, unspecified: Secondary | ICD-10-CM

## 2021-10-05 DIAGNOSIS — J441 Chronic obstructive pulmonary disease with (acute) exacerbation: Secondary | ICD-10-CM

## 2021-10-05 DIAGNOSIS — I2510 Atherosclerotic heart disease of native coronary artery without angina pectoris: Secondary | ICD-10-CM

## 2021-10-05 DIAGNOSIS — I25118 Atherosclerotic heart disease of native coronary artery with other forms of angina pectoris: Secondary | ICD-10-CM

## 2021-10-05 DIAGNOSIS — G720 Drug-induced myopathy: Secondary | ICD-10-CM

## 2021-10-05 DIAGNOSIS — R911 Solitary pulmonary nodule: Secondary | ICD-10-CM

## 2021-10-05 DIAGNOSIS — Z789 Other specified health status: Secondary | ICD-10-CM

## 2021-10-05 MED ORDER — REPATHA SURECLICK 140 MG/ML ~~LOC~~ SOAJ: 140.0000 mg | SUBCUTANEOUS | 6 refills | Status: DC

## 2021-10-05 MED ORDER — PREDNISONE 20 MG PO TABS: ORAL_TABLET | ORAL | 0 refills | Status: DC

## 2021-10-05 MED ORDER — AZITHROMYCIN 250 MG PO TABS: ORAL_TABLET | ORAL | 0 refills | Status: DC

## 2021-10-05 NOTE — Patient Instructions (Signed)
I'm sorry you're not feeling well today We will prescribe z pack and prednisone 40 mg a day for five days continue inhalers follow-up in three months

## 2021-10-05 NOTE — Telephone Encounter (Signed)
Pt c/o medication issue:  1. Name of Medication:   Alirocumab (PRALUENT) 150 MG/ML SOAJ    2. How are you currently taking this medication (dosage and times per day)? INJECT 150 MG INTO THE SKIN EVERY 14 (FOURTEEN) DAYS.  3. Are you having a reaction (difficulty breathing--STAT)?  No  4. What is your medication issue? Pt states that insurance will not pay for medication. Pt says that they told her that they will pay for Repatha medication. Pt would like to know if Dr. Bettina Gavia would be willing to switch medication. Please advise

## 2021-10-05 NOTE — Telephone Encounter (Signed)
Copay card information has been sent to pt.

## 2021-10-05 NOTE — Addendum Note (Signed)
Addended by: Truddie Hidden on: 10/05/2021 11:09 AM   Modules accepted: Orders

## 2021-10-05 NOTE — Telephone Encounter (Signed)
Pt states that she received a letter from her insurance stating they will no longer cover Praluent but will cover Repatha. Please advise.

## 2021-10-05 NOTE — Telephone Encounter (Signed)
New Message:    Patient says the Repatha is expensive too. She wants to know if there is a Buyer, retail card or Coupon that you have for this please?

## 2021-10-05 NOTE — Telephone Encounter (Signed)
Pt aware that we have changed her medication to [facility]. Information for copay card has been sent via email and [facility].

## 2021-10-05 NOTE — Progress Notes (Signed)
[name]    [id number]    [birth year]  Primary Care Physician:Osei-Bonsu, Iona Beard, MD  Referring Physician: Benito Mccreedy, MD [id number] [place],  [place] [address]  Chief complaint:  Follow-up for  Severe COPD  HPI: 61 year old smoker with very severe COPD, chronic hypoxic respiratory failure.  She is previously followed by Dr. Ashok Cordia.  Pulmicort and Perforomist were ordered last year but insurance would not cover She was then placed on Symbicort in 2018 but had worsening symptoms of dyspnea, wheeze. Inhalers were switched to Trelegy at office visit in March 2019.    Continues to have chronic cough, dyspnea on exertion, intermittent hemoptysis with mucus Plavix has been held by Dr. Bettina Gavia, cardiology  Pets: No pets, birds, farm animals Occupation: Previously worked doing [place] jobs and [employer]  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 Exposures: No mold, hot tub, Jacuzzi. Smoking history: 35-pack-year smoker.  Quit in 2011 Travel history: No significant travel history Relevant family history: No significant family history of lung disease.  Interim history: She was treated for COPD exacerbation around late November 2022.  CT at that time showed some inflammatory changes at the base of the lung Hospitalized for COPD exacerbation in mid February 2023 which was treated with steroids and bronchodilators  Low-dose CT in March 2023 showed right middle lobe and right lower lobe consolidation for which you receive doxycycline  Over the past week she is complaining of increasing dyspnea, wheezing.  Outpatient Encounter Medications as of 10/05/2021  Medication Sig   albuterol (PROVENTIL) (2.5 MG/3ML) 0.083% nebulizer solution USE 1 VIAL IN NEBULIZER EVERY 6 HOURS - as needed for wheezing or shortness of breath (Patient taking differently: Take 2.5 mg by nebulization every 6 (six)  hours as needed for wheezing or shortness of breath.)   albuterol (VENTOLIN HFA) 108 (90 Base) MCG/ACT inhaler INHALE 2 PUFFS BY MOUTH INTO THE LUNGS EVERY 4 HOURS AS NEEDED FOR WHEEZE OR FOR SHORTNESS OF BREATH   Budeson-Glycopyrrol-Formoterol (BREZTRI AEROSPHERE) 160-9-4.8 MCG/ACT AERO TAKE 2 PUFFS BY MOUTH TWICE A DAY   Evolocumab (REPATHA SURECLICK) 130 MG/ML SOAJ Inject 140 mg into the skin every 14 (fourteen) days.   famotidine (PEPCID) 20 MG tablet TAKE 1 TABLET BY MOUTH EVERYDAY AT BEDTIME (Patient taking differently: Take 20 mg by mouth at bedtime.)   ibuprofen (ADVIL) 200 MG tablet Take 400 mg by mouth every 6 (six) hours as needed for fever, headache or mild pain.   montelukast (SINGULAIR) 10 MG tablet TAKE 1 TABLET BY MOUTH EVERYDAY AT BEDTIME   nitroGLYCERIN (NITROSTAT) 0.4 MG SL tablet Place 1 tablet (0.4 mg total) under the tongue every 5 (five) minutes as needed for chest pain.   OXYGEN Inhale 3 L into the lungs at bedtime.    [DISCONTINUED] doxycycline (VIBRA-TABS) 100 MG tablet Take 1 tablet (100 mg total) by mouth 2 (two) times daily.   [DISCONTINUED] isosorbide mononitrate (IMDUR) 30 MG 24 hr tablet Take 1 tablet (30 mg total) by mouth daily.   No facility-administered encounter medications on file as of 10/05/2021.   Physical Exam: Blood pressure 130/72, pulse 79, temperature 98.4 F (36.9 C), temperature source Oral, height '5\' 3"'$  (1.6 m), weight 168 lb 3.2 oz (76.3 kg), SpO2 90 %. Gen:      No acute distress HEENT:  EOMI, sclera anicteric Neck:     No masses; no thyromegaly  Lungs:    bilateral expert release CV:         Regular rate and rhythm; no murmurs Abd:      + bowel sounds; soft, non-tender; no palpable masses, no distension Ext:    No edema; adequate peripheral perfusion Skin:      Warm and dry; no rash Neuro: alert and oriented x 3 Psych: normal mood and affect   Data Reviewed: Imaging: Low-dose screening CT 04/19/2021-new areas of bilateral subpleural  groundglass consolidation in the lower lobes, coronary artery calcification, enlarged PA, emphysema.  Screening CT 07/15/21 - new large consolidation right middle and right lower lobe I have reviewed the images personally.  PFT 06/02/17 FVC 1.50 [60%], FEV1 0.79 [40%], F/F 53, TLC 96, RV/TLC 167%, DLCO 32% Severe obstruction with bronchodilator response, air-trapping.  Severe diffusion impairment.  6MWT 11/10/15:  Walked 360 meters / Baseline Sat 96% on RA / Nadir Sat 95% on RA  MICROBIOLOGY Sputum Ctx (11/13/15):  Paecilomyces species / Oral Flora / AFB negative    LABS 03/01/15 HIV:  Negative   09/29/14 ANA:  Negative   05/16/11 Alpha-1 antitrypsin: MM (122)  Cardiac work-up by Dr. Einar Gip Nuclear stress test 11/14/16-very small inferoseptal ischemia, preserved LVEF with mild hypokinesis the same region Echocardiogram shows mild pulmonary hypertension with normal LV systolic function.  Assessment:  Severe COPD. Seen today with mild exacerbation presenting with wheeze and dyspnea Continue breztri, supplemental oxygen at night and with exertion Nebs as needed Prescribe z pack and prednisone  Consider chronic azithromycin if she continues to have exacerbations  Abnormal CT Recent CT shows inflammatory changes and consolidation Will order short-term follow-up to ensure resolution  Health maintenance Influenza vaccine-states that she got her flu vaccine this year at [facility]. 09/03/2011-Pneumovax  Plan/Recommendations: - Continue breztri - Continue supplemental oxygen - Z pack, prednisone - Follow-up CT  Marshell Garfinkel MD [facility] 10/05/2021, 4:33 PM  CC: Benito Mccreedy, MD

## 2021-10-07 ENCOUNTER — Telehealth: Payer: Self-pay

## 2021-10-07 NOTE — Telephone Encounter (Signed)
Message from Plan CaseId:[id number];Status:Approved;Review Type:Prior Auth;Coverage Start Date:09/07/2021;Coverage End Date:10/07/2022;

## 2021-10-07 NOTE — Telephone Encounter (Signed)
Prior authorization started for Repatha through [place] (Key: Colin Broach) Rx #: [id number] Repatha SureClick '140MG'$ /ML auto-injectors

## 2021-10-29 ENCOUNTER — Telehealth: Payer: Self-pay | Admitting: Pulmonary Disease

## 2021-10-29 NOTE — Telephone Encounter (Signed)
Called and spoke with patient who will be flying to [place] on 6/30-7/3. pt wants to bring POC on but she needs documentation that she can use the o2 on 2 liter for the flight there and flight back. Patient would like to come pick up letter when completed.  Please advise

## 2021-11-01 ENCOUNTER — Ambulatory Visit (INDEPENDENT_AMBULATORY_CARE_PROVIDER_SITE_OTHER)
Admission: RE | Admit: 2021-11-01 | Discharge: 2021-11-01 | Disposition: A | Discharge status: Home / Self Care | Payer: BC Managed Care – PPO | Source: Ambulatory Visit | Attending: Pulmonary Disease | Admitting: Pulmonary Disease

## 2021-11-01 DIAGNOSIS — R911 Solitary pulmonary nodule: Secondary | ICD-10-CM | POA: Diagnosis not present

## 2021-11-02 NOTE — Telephone Encounter (Signed)
Okay to give letter for travel regarding her POC.

## 2021-11-03 ENCOUNTER — Encounter: Payer: Self-pay | Admitting: *Deleted

## 2021-11-03 NOTE — Telephone Encounter (Signed)
Letter obtained and has been signed by Dr. Alexander Bergeron and spoke with pt letting her know this was taken care of and that I was placing it up front for her to come by to pick up and pt verbalized understanding. Nothing further needed.

## 2021-11-25 ENCOUNTER — Other Ambulatory Visit: Payer: Self-pay | Admitting: *Deleted

## 2021-11-25 DIAGNOSIS — R911 Solitary pulmonary nodule: Secondary | ICD-10-CM

## 2021-12-10 ENCOUNTER — Telehealth: Payer: Self-pay | Admitting: Pulmonary Disease

## 2021-12-10 MED ORDER — BREZTRI AEROSPHERE 160-9-4.8 MCG/ACT IN AERO: 2.0000 | INHALATION_SPRAY | Freq: Two times a day (BID) | RESPIRATORY_TRACT | 0 refills | Status: DC

## 2021-12-10 NOTE — Telephone Encounter (Signed)
Called and spoke with patient. Patient stated that she could not get her breztri refilled until the 24th of next month. I advised patient I would leave her samples of the breztri for her up at the front to pick up. Patient stated that there was a recall on albuterol.   Patient is coming today to pick up samples.   Nothing further needed.

## 2021-12-15 ENCOUNTER — Telehealth: Payer: Self-pay | Admitting: Pulmonary Disease

## 2021-12-15 NOTE — Telephone Encounter (Signed)
Received FMLA update form from [employer].  Completed the form based on information from last FMLA form signed on 07/29/2021 and sent to Dr. [name] for signature.

## 2021-12-20 ENCOUNTER — Telehealth: Payer: Self-pay | Admitting: Pulmonary Disease

## 2021-12-20 NOTE — Telephone Encounter (Signed)
Updated FMLA form signed by Dr. [name] - faxed to [place] fax# [phone].  Hard copy mailed to patient.

## 2021-12-20 NOTE — Telephone Encounter (Signed)
Primary Pulmonologist: PM Last office visit and with whom: 10/05/21 PM What do we see them for (pulmonary problems): Severe COPD, Abnormal CT  Last OV assessment/plan: Assessment:  Severe COPD. Seen today with mild exacerbation presenting with wheeze and dyspnea Continue breztri, supplemental oxygen at night and with exertion Nebs as needed Prescribe z pack and prednisone   Consider chronic azithromycin if she continues to have exacerbations   Abnormal CT Recent CT shows inflammatory changes and consolidation Will order short-term follow-up to ensure resolution   Health maintenance Influenza vaccine-states that she [facility] flu vaccine [facility]. 09/03/2011-Pneumovax   Plan/Recommendations: - Continue breztri - Continue supplemental oxygen - Z pack, prednisone - Follow-up CT  Was appointment offered to patient (explain)?  Pt unavailable for appointment    Reason for call: Pt c/o productive cough (yellow mucus) for 3 days. Pt states she has tried Mucinex but it does not seem to be helping. Pt denies Fever/ chills/ SOB/ GI upset. Pt states she is not currently taking any ABTs or prednisone. Tammy will please advise as Dr. [name] is unavailable?    (examples of things to ask: : When did symptoms start? Fever? Cough? Productive? Color to sputum? More sputum than usual? Wheezing? Have you needed increased oxygen? Are you taking your respiratory medications? What over the counter measures have you tried?)  Allergies  Allergen Reactions   Amoxicillin Shortness Of Breath   Tylenol [Acetaminophen] Shortness Of Breath   Statins    Zetia [Ezetimibe]     Immunization History  Administered Date(s) Administered   Influenza Inj Mdck Quad Pf 01/24/2017   Influenza Split 02/09/2012, 02/14/2015   Influenza Whole 12/24/2017   Influenza,inj,Quad PF,6+ Mos 01/14/2017   Influenza-Unspecified 05/16/2012, 12/31/2018   PFIZER(Purple Top)SARS-COV-2 Vaccination 08/19/2019, 09/13/2019    Pneumococcal Polysaccharide-23 09/11/2011, 04/15/2012

## 2021-12-20 NOTE — Telephone Encounter (Signed)
ATC voicemail is full 

## 2021-12-20 NOTE — Telephone Encounter (Signed)
Can see for video visit today at 13 or double book at 3  If has covid home test , would recommend .   Please contact office for sooner follow up if symptoms do not improve or worsen or seek emergency care

## 2021-12-20 NOTE — Telephone Encounter (Signed)
Spoke with pt and scheduled with Tammy in [place] on 12/21/21. Nothing further needed at this time.

## 2021-12-21 ENCOUNTER — Ambulatory Visit (INDEPENDENT_AMBULATORY_CARE_PROVIDER_SITE_OTHER): Payer: BC Managed Care – PPO | Admitting: Adult Health

## 2021-12-21 ENCOUNTER — Encounter: Payer: Self-pay | Admitting: Adult Health

## 2021-12-21 DIAGNOSIS — J441 Chronic obstructive pulmonary disease with (acute) exacerbation: Secondary | ICD-10-CM

## 2021-12-21 DIAGNOSIS — R9389 Abnormal findings on diagnostic imaging of other specified body structures: Secondary | ICD-10-CM

## 2021-12-21 DIAGNOSIS — J9611 Chronic respiratory failure with hypoxia: Secondary | ICD-10-CM

## 2021-12-21 HISTORY — DX: Abnormal findings on diagnostic imaging of other specified body structures: R93.89

## 2021-12-21 HISTORY — DX: Chronic respiratory failure with hypoxia: J96.11

## 2021-12-21 MED ORDER — AZITHROMYCIN 250 MG PO TABS: ORAL_TABLET | ORAL | 0 refills | Status: AC

## 2021-12-21 MED ORDER — PREDNISONE 10 MG PO TABS: ORAL_TABLET | ORAL | 0 refills | Status: DC

## 2021-12-21 NOTE — Assessment & Plan Note (Signed)
Patient is a former smoker.  CT chest shows ongoing consolidation in the right middle lobe.  She has a follow-up CT chest October 2023.  He is to keep follow-up visit.
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
## 2021-12-21 NOTE — Assessment & Plan Note (Signed)
Continue on oxygen to maintain O2 saturations greater than 88 to 90%. 

## 2021-12-21 NOTE — Assessment & Plan Note (Signed)
Acute COPD exacerbation.  Will treat with empiric antibiotics and steroids.  Continue on mucolytic's.  Continue on aggressive mucociliary clearance with neb treatments  Plan Patient Instructions  Zpack take as directed  Mucinex DM Twice daily  As needed cough /congestion  Prednisone taper over next week.  Activity as tolerated Continue on BREZTRI 2 puffs Twice daily , rinse after use.  Albuterol inhaler/neb As needed   Follow up with Dr. [name] as planned this month  CT next month in October.  Please contact office for sooner follow up if symptoms do not improve or worsen or seek emergency care

## 2021-12-21 NOTE — Patient Instructions (Addendum)
Zpack take as directed  Mucinex DM Twice daily  As needed cough /congestion  Prednisone taper over next week.  Activity as tolerated Continue on BREZTRI 2 puffs Twice daily , rinse after use.  Albuterol inhaler/neb As needed   Follow up with Dr. [name] as planned this month  CT next month in October.  Please contact office for sooner follow up if symptoms do not improve or worsen or seek emergency care

## 2022-01-04 ENCOUNTER — Telehealth: Payer: Self-pay | Admitting: Cardiology

## 2022-01-04 MED ORDER — FAMOTIDINE 20 MG PO TABS
20.0000 mg | ORAL_TABLET | Freq: Every day | ORAL | 0 refills | Status: DC
Start: 2022-01-04 — End: 2022-01-04

## 2022-01-04 MED ORDER — FAMOTIDINE 20 MG PO TABS: 20.0000 mg | ORAL_TABLET | Freq: Every day | ORAL | 0 refills | Status: DC

## 2022-01-04 MED ORDER — FAMOTIDINE 20 MG PO TABS: 20.0000 mg | ORAL_TABLET | Freq: Every day | ORAL | 1 refills | Status: DC

## 2022-01-04 NOTE — Telephone Encounter (Signed)
Medication sent.

## 2022-01-04 NOTE — Telephone Encounter (Signed)
*  STAT* If patient is at the pharmacy, call can be transferred to refill team.   1. Which medications need to be refilled? (please list name of each medication and dose if known) famotidine (PEPCID) 20 MG tablet  2. Which pharmacy/location (including street and city if local pharmacy) is medication to be sent to? [id number]- [place], [place] - [address]  3. Do they need a 30 day or 90 day supply? [address]

## 2022-01-04 NOTE — Addendum Note (Signed)
Addended by: Darrel Reach on: 01/04/2022 04:14 PM   Modules accepted: Orders

## 2022-01-05 ENCOUNTER — Encounter: Payer: Self-pay | Admitting: Pulmonary Disease

## 2022-01-05 ENCOUNTER — Ambulatory Visit (INDEPENDENT_AMBULATORY_CARE_PROVIDER_SITE_OTHER): Payer: BC Managed Care – PPO | Admitting: Pulmonary Disease

## 2022-01-05 VITALS — BP 118/64 | HR 89 | Temp 98.9°F | Ht 63.0 in | Wt 165.6 lb

## 2022-01-05 DIAGNOSIS — J449 Chronic obstructive pulmonary disease, unspecified: Secondary | ICD-10-CM | POA: Diagnosis not present

## 2022-01-05 MED ORDER — ROFLUMILAST 500 MCG PO TABS: ORAL_TABLET | ORAL | 11 refills | Status: DC

## 2022-01-05 MED ORDER — ROFLUMILAST 250 MCG PO TABS: ORAL_TABLET | ORAL | 0 refills | Status: DC

## 2022-01-05 MED ORDER — PREDNISONE 10 MG PO TABS: 10.0000 mg | ORAL_TABLET | Freq: Every day | ORAL | 2 refills | Status: DC

## 2022-01-05 NOTE — Progress Notes (Signed)
[name]    [id number]    [birth year]  Primary Care Physician:Osei-Bonsu, Iona Beard, MD  Referring Physician: Benito Mccreedy, MD [id number] [place],  [place] [address]  Chief complaint:  Follow-up for  Severe COPD  HPI: 61 year old smoker with very severe COPD, chronic hypoxic respiratory failure.  She is previously followed by Dr. Ashok Cordia.  Pulmicort and Perforomist were ordered last year but insurance would not cover She was then placed on Symbicort in 2018 but had worsening symptoms of dyspnea, wheeze. Inhalers were switched to Trelegy at office visit in March 2019.    Continues to have chronic cough, dyspnea on exertion, intermittent hemoptysis with mucus Plavix has been held by Dr. Bettina Gavia, cardiology  Pets: No pets, birds, farm animals Occupation: Previously worked doing [place] jobs and [employer]  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 Exposures: No mold, hot tub, Jacuzzi. Smoking history: 35-pack-year smoker.  Quit in 2011 Travel history: No significant travel history Relevant family history: No significant family history of lung disease.  Interim history: She was treated for COPD exacerbation around late November 2022.  CT at that time showed some inflammatory changes at the base of the lung Hospitalized for COPD exacerbation in mid February 2023 which was treated with steroids and bronchodilators Got Z-Pak and prednisone in May 2023 for exacerbation She was seen again in clinic in early August 2023 for exacerbation and given another round of Z-Pak and prednisone   Outpatient Encounter Medications as of 01/05/2022  Medication Sig   albuterol (PROVENTIL) (2.5 MG/3ML) 0.083% nebulizer solution USE 1 VIAL IN NEBULIZER EVERY 6 HOURS - as needed for wheezing or shortness of breath (Patient taking differently: Take 2.5 mg by nebulization every 6 (six) hours as needed for  wheezing or shortness of breath.)   albuterol (VENTOLIN HFA) 108 (90 Base) MCG/ACT inhaler INHALE 2 PUFFS BY MOUTH INTO THE LUNGS EVERY 4 HOURS AS NEEDED FOR WHEEZE OR FOR SHORTNESS OF BREATH   Budeson-Glycopyrrol-Formoterol (BREZTRI AEROSPHERE) 160-9-4.8 MCG/ACT AERO TAKE 2 PUFFS BY MOUTH TWICE A DAY   Evolocumab (REPATHA SURECLICK) 287 MG/ML SOAJ Inject 140 mg into the skin every 14 (fourteen) days.   famotidine (PEPCID) 20 MG tablet Take 1 tablet (20 mg total) by mouth at bedtime.   ibuprofen (ADVIL) 200 MG tablet Take 400 mg by mouth every 6 (six) hours as needed for fever, headache or mild pain.   montelukast (SINGULAIR) 10 MG tablet TAKE 1 TABLET BY MOUTH EVERYDAY AT BEDTIME   nitroGLYCERIN (NITROSTAT) 0.4 MG SL tablet Place 1 tablet (0.4 mg total) under the tongue every 5 (five) minutes as needed for chest pain.   OXYGEN Inhale 3 L into the lungs at bedtime.    [DISCONTINUED] Budeson-Glycopyrrol-Formoterol (BREZTRI AEROSPHERE) 160-9-4.8 MCG/ACT AERO Inhale 2 puffs into the lungs in the morning and at bedtime.   [DISCONTINUED] predniSONE (DELTASONE) 10 MG tablet 4 tabs for 2 days, then 3 tabs for 2 days, 2 tabs for 2 days, then 1 tab for 2 days, then stop   No facility-administered encounter medications on file as of 01/05/2022.   Physical Exam: Blood pressure 118/64, pulse 89, temperature 98.9 F (37.2 C), temperature source Oral, height '5\' 3"'$  (1.6 m), weight 165 lb 9.6 oz (75.1 kg), SpO2 97 %. Gen:      No acute distress HEENT:  EOMI, sclera anicteric Neck:     No  masses; no thyromegaly Lungs:    Clear to auscultation bilaterally; normal respiratory effort CV:         Regular rate and rhythm; no murmurs Abd:      + bowel sounds; soft, non-tender; no palpable masses, no distension Ext:    No edema; adequate peripheral perfusion Skin:      Warm and dry; no rash Neuro: alert and oriented x 3 Psych: normal mood and affect   Data Reviewed: Imaging: Low-dose screening CT 04/19/2021-new  areas of bilateral subpleural groundglass consolidation in the lower lobes, coronary artery calcification, enlarged PA, emphysema.  Screening CT 07/15/21 - new large consolidation right middle and right lower lobe  CT 11/01/2021-irregular focus of consolidation in the right middle lobe I have reviewed the images personally.  PFT 06/02/17 FVC 1.50 [60%], FEV1 0.79 [40%], F/F 53, TLC 96, RV/TLC 167%, DLCO 32% Severe obstruction with bronchodilator response, air-trapping.  Severe diffusion impairment.  6MWT 11/10/15:  Walked 360 meters / Baseline Sat 96% on RA / Nadir Sat 95% on RA  MICROBIOLOGY Sputum Ctx (11/13/15):  Paecilomyces species / Oral Flora / AFB negative    LABS 03/01/15 HIV:  Negative   09/29/14 ANA:  Negative   05/16/11 Alpha-1 antitrypsin: MM (122)  Cardiac work-up by Dr. Einar Gip Nuclear stress test 11/14/16-very small inferoseptal ischemia, preserved LVEF with mild hypokinesis the same region Echocardiogram shows mild pulmonary hypertension with normal LV systolic function.  Assessment:  Severe COPD. She has had several exacerbations recently and continues to be symptomatic with wheeze and dyspnea Continue breztri, nebs as needed Increase supplemental oxygen to 5 L Start Daliresp to reduce frequency of exacerbations Start prednisone 10 mg/day for now until Daliresp can be approved by insurance  Consider chronic azithromycin if she continues to have exacerbations  Abnormal CT Recent CT shows inflammatory changes and consolidation Will order short-term follow-up to ensure resolution.  Follow-up CT scheduled in October  Health maintenance Influenza vaccine-[facility] that [facility] 09/03[facility]-Pneumovax  Plan/Recommendations: - Continue breztri - Continue supplemental oxygen - Daliresp, chronic prednisone - Follow-up CT  Marshell Garfinkel MD [facility] 01/05/2022, 4:31 PM  CC: Benito Mccreedy, MD

## 2022-01-05 NOTE — Patient Instructions (Signed)
We will try a new medication called Daliresp to help with frequency of exacerbation Start prednisone 10 mg a day Continue breztri inhaler and albuterol Follow-up in 3 months

## 2022-01-05 NOTE — Addendum Note (Signed)
Addended by: Elton Sin on: 01/05/2022 05:05 PM   Modules accepted: Orders

## 2022-01-10 ENCOUNTER — Emergency Department (HOSPITAL_COMMUNITY): Payer: BC Managed Care – PPO

## 2022-01-10 ENCOUNTER — Inpatient Hospital Stay (HOSPITAL_COMMUNITY)
Admission: EM | Admit: 2022-01-10 | Discharge: 2022-01-12 | DRG: 193 | Disposition: A | Discharge status: Home / Self Care | Payer: BC Managed Care – PPO | Attending: Internal Medicine | Admitting: Internal Medicine

## 2022-01-10 DIAGNOSIS — I2510 Atherosclerotic heart disease of native coronary artery without angina pectoris: Secondary | ICD-10-CM | POA: Diagnosis present

## 2022-01-10 DIAGNOSIS — Z8249 Family history of ischemic heart disease and other diseases of the circulatory system: Secondary | ICD-10-CM

## 2022-01-10 DIAGNOSIS — Z7952 Long term (current) use of systemic steroids: Secondary | ICD-10-CM

## 2022-01-10 DIAGNOSIS — R7303 Prediabetes: Secondary | ICD-10-CM | POA: Diagnosis present

## 2022-01-10 DIAGNOSIS — I25118 Atherosclerotic heart disease of native coronary artery with other forms of angina pectoris: Secondary | ICD-10-CM | POA: Diagnosis not present

## 2022-01-10 DIAGNOSIS — R000 Tachycardia, unspecified: Secondary | ICD-10-CM | POA: Diagnosis present

## 2022-01-10 DIAGNOSIS — Z87891 Personal history of nicotine dependence: Secondary | ICD-10-CM

## 2022-01-10 DIAGNOSIS — R0603 Acute respiratory distress: Secondary | ICD-10-CM | POA: Diagnosis not present

## 2022-01-10 DIAGNOSIS — J449 Chronic obstructive pulmonary disease, unspecified: Secondary | ICD-10-CM

## 2022-01-10 DIAGNOSIS — E785 Hyperlipidemia, unspecified: Secondary | ICD-10-CM | POA: Diagnosis present

## 2022-01-10 DIAGNOSIS — D638 Anemia in other chronic diseases classified elsewhere: Secondary | ICD-10-CM | POA: Diagnosis present

## 2022-01-10 DIAGNOSIS — E7800 Pure hypercholesterolemia, unspecified: Secondary | ICD-10-CM

## 2022-01-10 DIAGNOSIS — Z9981 Dependence on supplemental oxygen: Secondary | ICD-10-CM

## 2022-01-10 DIAGNOSIS — E669 Obesity, unspecified: Secondary | ICD-10-CM | POA: Diagnosis present

## 2022-01-10 DIAGNOSIS — Z823 Family history of stroke: Secondary | ICD-10-CM

## 2022-01-10 DIAGNOSIS — J9621 Acute and chronic respiratory failure with hypoxia: Secondary | ICD-10-CM | POA: Diagnosis present

## 2022-01-10 DIAGNOSIS — J9601 Acute respiratory failure with hypoxia: Secondary | ICD-10-CM | POA: Diagnosis present

## 2022-01-10 DIAGNOSIS — J441 Chronic obstructive pulmonary disease with (acute) exacerbation: Secondary | ICD-10-CM | POA: Diagnosis present

## 2022-01-10 DIAGNOSIS — Z825 Family history of asthma and other chronic lower respiratory diseases: Secondary | ICD-10-CM

## 2022-01-10 DIAGNOSIS — J189 Pneumonia, unspecified organism: Secondary | ICD-10-CM | POA: Diagnosis not present

## 2022-01-10 DIAGNOSIS — J440 Chronic obstructive pulmonary disease with acute lower respiratory infection: Secondary | ICD-10-CM | POA: Diagnosis present

## 2022-01-10 DIAGNOSIS — Z79899 Other long term (current) drug therapy: Secondary | ICD-10-CM

## 2022-01-10 DIAGNOSIS — Z888 Allergy status to other drugs, medicaments and biological substances status: Secondary | ICD-10-CM

## 2022-01-10 DIAGNOSIS — Z6829 Body mass index (BMI) 29.0-29.9, adult: Secondary | ICD-10-CM

## 2022-01-10 DIAGNOSIS — K219 Gastro-esophageal reflux disease without esophagitis: Secondary | ICD-10-CM | POA: Diagnosis present

## 2022-01-10 DIAGNOSIS — Z20822 Contact with and (suspected) exposure to covid-19: Secondary | ICD-10-CM | POA: Diagnosis present

## 2022-01-10 DIAGNOSIS — Z880 Allergy status to penicillin: Secondary | ICD-10-CM

## 2022-01-10 DIAGNOSIS — I10 Essential (primary) hypertension: Secondary | ICD-10-CM | POA: Diagnosis present

## 2022-01-10 HISTORY — DX: Acute respiratory failure with hypoxia: J96.01

## 2022-01-10 HISTORY — DX: Chronic obstructive pulmonary disease, unspecified: J44.9

## 2022-01-10 LAB — BLOOD GAS, VENOUS
Acid-Base Excess: 4.2 mmol/L — ABNORMAL HIGH (ref 0.0–2.0)
Bicarbonate: 30.6 mmol/L — ABNORMAL HIGH (ref 20.0–28.0)
O2 Saturation: 76.8 %
Patient temperature: 37
pCO2, Ven: 53 mmHg (ref 44–60)
pH, Ven: 7.37 (ref 7.25–7.43)
pO2, Ven: 49 mmHg — ABNORMAL HIGH (ref 32–45)

## 2022-01-10 LAB — CBC WITH DIFFERENTIAL/PLATELET
Abs Immature Granulocytes: 0.09 10*3/uL — ABNORMAL HIGH (ref 0.00–0.07)
Basophils Absolute: 0 10*3/uL (ref 0.0–0.1)
Basophils Relative: 0 %
Eosinophils Absolute: 0 10*3/uL (ref 0.0–0.5)
Eosinophils Relative: 0 %
HCT: 38.9 % (ref 36.0–46.0)
Hemoglobin: 12.3 g/dL (ref 12.0–15.0)
Immature Granulocytes: 1 %
Lymphocytes Relative: 16 %
Lymphs Abs: 2.3 10*3/uL (ref 0.7–4.0)
MCH: 28.5 pg (ref 26.0–34.0)
MCHC: 31.6 g/dL (ref 30.0–36.0)
MCV: 90.3 fL (ref 80.0–100.0)
Monocytes Absolute: 1 10*3/uL (ref 0.1–1.0)
Monocytes Relative: 7 %
Neutro Abs: 11.2 10*3/uL — ABNORMAL HIGH (ref 1.7–7.7)
Neutrophils Relative %: 76 %
Platelets: 396 10*3/uL (ref 150–400)
RBC: 4.31 MIL/uL (ref 3.87–5.11)
RDW: 13.8 % (ref 11.5–15.5)
WBC: 14.6 10*3/uL — ABNORMAL HIGH (ref 4.0–10.5)
nRBC: 0 % (ref 0.0–0.2)

## 2022-01-10 LAB — BASIC METABOLIC PANEL
Anion gap: 11 (ref 5–15)
BUN: 14 mg/dL (ref 8–23)
CO2: 28 mmol/L (ref 22–32)
Calcium: 9.5 mg/dL (ref 8.9–10.3)
Chloride: 101 mmol/L (ref 98–111)
Creatinine, Ser: 0.81 mg/dL (ref 0.44–1.00)
GFR, Estimated: >60 mL/min (ref >60)
Glucose, Bld: 127 mg/dL — ABNORMAL HIGH (ref 70–99)
Potassium: 3.7 mmol/L (ref 3.5–5.1)
Sodium: 140 mmol/L (ref 135–145)

## 2022-01-10 LAB — TROPONIN I (HIGH SENSITIVITY)
Troponin I (High Sensitivity): 6 ng/L (ref <18)
Troponin I (High Sensitivity): 4 ng/L (ref <18)

## 2022-01-10 LAB — RESP PANEL BY RT-PCR (FLU A&B, COVID) ARPGX2
Influenza A by PCR: NEGATIVE
Influenza B by PCR: NEGATIVE
SARS Coronavirus 2 by RT PCR: NEGATIVE

## 2022-01-10 LAB — LACTIC ACID, PLASMA: Lactic Acid, Venous: 1.9 mmol/L (ref 0.5–1.9)

## 2022-01-10 LAB — BRAIN NATRIURETIC PEPTIDE: B Natriuretic Peptide: 48.9 pg/mL (ref 0.0–100.0)

## 2022-01-10 MED ORDER — BUDESON-GLYCOPYRROL-FORMOTEROL 160-9-4.8 MCG/ACT IN AERO
INHALATION_SPRAY | Freq: Two times a day (BID) | RESPIRATORY_TRACT | Status: DC
Start: 2022-01-10 — End: 2022-01-10

## 2022-01-10 MED ORDER — SODIUM CHLORIDE 0.9 % IV SOLN
500.0000 mg | Freq: Once | INTRAVENOUS | Status: AC
  Administered 2022-01-10: 500 mg via INTRAVENOUS
  Filled 2022-01-10: qty 5

## 2022-01-10 MED ORDER — ALBUTEROL (5 MG/ML) CONTINUOUS INHALATION SOLN: 10.0000 mg/h | INHALATION_SOLUTION | RESPIRATORY_TRACT | Status: DC

## 2022-01-10 MED ORDER — LORAZEPAM 2 MG/ML IJ SOLN
0.5000 mg | Freq: Once | INTRAMUSCULAR | Status: AC
  Administered 2022-01-10: 0.5 mg via INTRAVENOUS
  Filled 2022-01-10: qty 1

## 2022-01-10 MED ORDER — ALBUTEROL SULFATE (2.5 MG/3ML) 0.083% IN NEBU
2.5000 mg | INHALATION_SOLUTION | RESPIRATORY_TRACT | Status: DC | PRN
  Administered 2022-01-11 – 2022-01-12 (×2): 2.5 mg via RESPIRATORY_TRACT
  Filled 2022-01-10 (×2): qty 3

## 2022-01-10 MED ORDER — METHYLPREDNISOLONE SODIUM SUCC 125 MG IJ SOLR
125.0000 mg | Freq: Once | INTRAMUSCULAR | Status: AC
  Administered 2022-01-10: 125 mg via INTRAVENOUS
  Filled 2022-01-10: qty 2

## 2022-01-10 MED ORDER — ALBUTEROL SULFATE HFA 108 (90 BASE) MCG/ACT IN AERS: 2.0000 | INHALATION_SPRAY | RESPIRATORY_TRACT | Status: DC | PRN

## 2022-01-10 MED ORDER — MOMETASONE FURO-FORMOTEROL FUM 200-5 MCG/ACT IN AERO
2.0000 | INHALATION_SPRAY | Freq: Two times a day (BID) | RESPIRATORY_TRACT | Status: DC
  Administered 2022-01-11 – 2022-01-12 (×2): 2 via RESPIRATORY_TRACT
  Filled 2022-01-10: qty 8.8

## 2022-01-10 MED ORDER — SODIUM CHLORIDE 0.9 % IV SOLN
1.0000 g | INTRAVENOUS | Status: DC
  Administered 2022-01-11 – 2022-01-12 (×2): 1 g via INTRAVENOUS
  Filled 2022-01-10 (×2): qty 10

## 2022-01-10 MED ORDER — POLYETHYLENE GLYCOL 3350 17 G PO PACK: 17.0000 g | PACK | Freq: Every day | ORAL | Status: DC | PRN

## 2022-01-10 MED ORDER — ALBUTEROL SULFATE (2.5 MG/3ML) 0.083% IN NEBU
INHALATION_SOLUTION | RESPIRATORY_TRACT | Status: AC
  Filled 2022-01-10: qty 12

## 2022-01-10 MED ORDER — ALBUTEROL SULFATE (2.5 MG/3ML) 0.083% IN NEBU
10.0000 mg | INHALATION_SOLUTION | Freq: Once | RESPIRATORY_TRACT | Status: AC
  Administered 2022-01-10: 10 mg via RESPIRATORY_TRACT

## 2022-01-10 MED ORDER — ENOXAPARIN SODIUM 40 MG/0.4ML IJ SOSY
40.0000 mg | PREFILLED_SYRINGE | INTRAMUSCULAR | Status: DC
  Administered 2022-01-10 – 2022-01-11 (×2): 40 mg via SUBCUTANEOUS
  Filled 2022-01-10 (×2): qty 0.4

## 2022-01-10 MED ORDER — PREDNISONE 20 MG PO TABS
40.0000 mg | ORAL_TABLET | Freq: Every day | ORAL | Status: DC
  Administered 2022-01-11 – 2022-01-12 (×2): 40 mg via ORAL
  Filled 2022-01-10 (×2): qty 2

## 2022-01-10 MED ORDER — SODIUM CHLORIDE 0.9% FLUSH
3.0000 mL | Freq: Two times a day (BID) | INTRAVENOUS | Status: DC
  Administered 2022-01-11 – 2022-01-12 (×2): 3 mL via INTRAVENOUS

## 2022-01-10 MED ORDER — ALBUTEROL SULFATE (2.5 MG/3ML) 0.083% IN NEBU
2.5000 mg | INHALATION_SOLUTION | Freq: Once | RESPIRATORY_TRACT | Status: AC
  Administered 2022-01-10: 2.5 mg via RESPIRATORY_TRACT
  Filled 2022-01-10: qty 3

## 2022-01-10 MED ORDER — MAGNESIUM SULFATE 2 GM/50ML IV SOLN
2.0000 g | Freq: Once | INTRAVENOUS | Status: AC
  Administered 2022-01-10: 2 g via INTRAVENOUS
  Filled 2022-01-10: qty 50

## 2022-01-10 MED ORDER — SODIUM CHLORIDE 0.9 % IV SOLN
1.0000 g | Freq: Once | INTRAVENOUS | Status: AC
  Administered 2022-01-10: 1 g via INTRAVENOUS
  Filled 2022-01-10: qty 10

## 2022-01-10 MED ORDER — IPRATROPIUM BROMIDE 0.02 % IN SOLN
0.5000 mg | Freq: Four times a day (QID) | RESPIRATORY_TRACT | Status: DC
Start: 2022-01-10 — End: 2022-01-11
  Administered 2022-01-10 – 2022-01-11 (×2): 0.5 mg via RESPIRATORY_TRACT
  Filled 2022-01-10 (×2): qty 2.5

## 2022-01-10 MED ORDER — FAMOTIDINE 20 MG PO TABS
20.0000 mg | ORAL_TABLET | Freq: Every day | ORAL | Status: DC
Start: 2022-01-10 — End: 2022-01-12
  Administered 2022-01-10 – 2022-01-11 (×2): 20 mg via ORAL
  Filled 2022-01-10 (×2): qty 1

## 2022-01-10 MED ORDER — IPRATROPIUM-ALBUTEROL 0.5-2.5 (3) MG/3ML IN SOLN
3.0000 mL | Freq: Once | RESPIRATORY_TRACT | Status: AC
  Administered 2022-01-10: 3 mL via RESPIRATORY_TRACT
  Filled 2022-01-10: qty 3

## 2022-01-10 MED ORDER — IBUPROFEN 200 MG PO TABS: 400.0000 mg | ORAL_TABLET | Freq: Four times a day (QID) | ORAL | Status: DC | PRN

## 2022-01-10 MED ORDER — ALBUTEROL (5 MG/ML) CONTINUOUS INHALATION SOLN: 10.0000 mg/h | INHALATION_SOLUTION | Freq: Once | RESPIRATORY_TRACT | Status: DC

## 2022-01-10 MED ORDER — UMECLIDINIUM BROMIDE 62.5 MCG/ACT IN AEPB
1.0000 | INHALATION_SPRAY | Freq: Every day | RESPIRATORY_TRACT | Status: DC
  Administered 2022-01-11 – 2022-01-12 (×2): 1 via RESPIRATORY_TRACT
  Filled 2022-01-10: qty 7

## 2022-01-10 MED ORDER — SODIUM CHLORIDE 0.9 % IV SOLN
500.0000 mg | INTRAVENOUS | Status: DC
  Administered 2022-01-11: 500 mg via INTRAVENOUS
  Filled 2022-01-10: qty 5

## 2022-01-10 NOTE — ED Provider Notes (Signed)
[facility] DEPT Provider Note   CSN: [id number] Arrival date & time: 01/10/22  1940     History  Chief Complaint  Patient presents with   Asthma    [name] is a 61 y.o. female.  61 year old female with prior medical history detailed below presents for evaluation.  Patient with longstanding history of COPD.  She is on baseline O2.  Patient presents with significant respiratory distress.  Patient is tachypneic and wheezing in all lung fields.  Patient is able to speak but in only short sentences.  She reports significant worsening of her breathing over the course of today.  She reports using nebs at home without improvement.  She denies chest pain.  She denies fever.  The history is provided by the patient and medical records.       Home Medications Prior to Admission medications   Medication Sig Start Date End Date Taking? Authorizing Provider  albuterol (PROVENTIL) (2.5 MG/3ML) 0.083% nebulizer solution USE 1 VIAL IN NEBULIZER EVERY 6 HOURS - as needed for wheezing or shortness of breath Patient taking differently: Take 2.5 mg by nebulization every 6 (six) hours as needed for wheezing or shortness of breath. 06/02/21   Mannam, Hart Robinsons, MD  albuterol (VENTOLIN HFA) 108 (90 Base) MCG/ACT inhaler INHALE 2 PUFFS BY MOUTH INTO THE LUNGS EVERY 4 HOURS AS NEEDED FOR WHEEZE OR FOR SHORTNESS OF BREATH 09/28/21   Mannam, Praveen, MD  Budeson-Glycopyrrol-Formoterol (BREZTRI AEROSPHERE) 160-9-4.8 MCG/ACT AERO TAKE 2 PUFFS BY MOUTH TWICE A DAY 06/27/21   Antonieta Pert, MD  Evolocumab (REPATHA SURECLICK) 409 MG/ML SOAJ Inject 140 mg into the skin every 14 (fourteen) days. 10/05/21   Richardo Priest, MD  famotidine (PEPCID) 20 MG tablet Take 1 tablet (20 mg total) by mouth at bedtime. 01/04/22   Richardo Priest, MD  ibuprofen (ADVIL) 200 MG tablet Take 400 mg by mouth every 6 (six) hours as needed for fever, headache or mild pain.    [provider]   montelukast (SINGULAIR) 10 MG tablet TAKE 1 TABLET BY MOUTH EVERYDAY AT BEDTIME 09/28/21   Mannam, Praveen, MD  nitroGLYCERIN (NITROSTAT) 0.4 MG SL tablet Place 1 tablet (0.4 mg total) under the tongue every 5 (five) minutes as needed for chest pain. 06/21/21   Richardo Priest, MD  OXYGEN Inhale 3 L into the lungs at bedtime.     [provider]  predniSONE (DELTASONE) 10 MG tablet Take 1 tablet (10 mg total) by mouth daily with breakfast. 01/05/22   Mannam, Hart Robinsons, MD  Roflumilast (DALIRESP) 250 MCG TABS Start '250mg'$  daily for 2 weeks 01/05/22   Marshell Garfinkel, MD  roflumilast (DALIRESP) 500 MCG TABS tablet Start '500mg'$  daily after 2 weeks of '250mg'$  01/05/22   Mannam, Hart Robinsons, MD      Allergies    Amoxicillin, Tylenol [acetaminophen], Statins, and Zetia [ezetimibe]    Review of Systems   Review of Systems  Unable to perform ROS: Acuity of condition    Physical Exam Updated Vital Signs BP (!) 146/111   Pulse 98   Resp (!) 27   SpO2 99%  Physical Exam Vitals and nursing note reviewed.  Constitutional:      General: She is not in acute distress.    Appearance: She is well-developed.     Comments: Alert, in obvious respiratory distress  HENT:     Head: Normocephalic and atraumatic.  Eyes:     Conjunctiva/sclera: Conjunctivae normal.     Pupils: Pupils are  equal, round, and reactive to light.  Cardiovascular:     Rate and Rhythm: Regular rhythm. Tachycardia present.     Heart sounds: Normal heart sounds.  Pulmonary:     Effort: Respiratory distress present.     Breath sounds: Wheezing present.     Comments: Tachypneic, limited air movement with auscultation.  Diffuse significant expiratory wheezes in all lung fields. Abdominal:     General: There is no distension.     Palpations: Abdomen is soft.     Tenderness: There is no abdominal tenderness.  Musculoskeletal:        General: No deformity. Normal range of motion.     Cervical back: Normal range of motion and neck  supple.  Skin:    General: Skin is warm and dry.  Neurological:     General: No focal deficit present.     Mental Status: She is alert and oriented to person, place, and time.     ED Results / Procedures / Treatments   Labs (all labs ordered are listed, but only abnormal results are displayed) Labs Reviewed  BASIC METABOLIC PANEL - Abnormal; Notable for the following components:      Result Value   Glucose, Bld 127 (*)    All other components within normal limits  CBC WITH DIFFERENTIAL/PLATELET - Abnormal; Notable for the following components:   WBC 14.6 (*)    Neutro Abs 11.2 (*)    Abs Immature Granulocytes 0.09 (*)    All other components within normal limits  BLOOD GAS, VENOUS - Abnormal; Notable for the following components:   pO2, Ven 49 (*)    Bicarbonate 30.6 (*)    Acid-Base Excess 4.2 (*)    All other components within normal limits  RESP PANEL BY RT-PCR (FLU A&B, COVID) ARPGX2  CULTURE, BLOOD (ROUTINE X 2)  CULTURE, BLOOD (ROUTINE X 2)  BRAIN NATRIURETIC PEPTIDE  LACTIC ACID, PLASMA  LACTIC ACID, PLASMA  TROPONIN I (HIGH SENSITIVITY)    EKG EKG Interpretation  Date/Time:  Monday January 10 2022 19:54:22 EDT Ventricular Rate:  124 PR Interval:  154 QRS Duration: 108 QT Interval:  318 QTC Calculation: 457 R Axis:   87 Text Interpretation: Sinus tachycardia LAE, consider biatrial enlargement Borderline right axis deviation Artifact in lead(s) I II III aVR aVL aVF V1 V2 V3 V4 V5 V6 Confirmed by Dene Gentry [phone]) on 01/10/2022 8:02:39 PM  Radiology DG Chest Port 1 View  Result Date: 01/10/2022 CLINICAL DATA:  Asthma, shortness of breath, audible wheezing. History of COPD. EXAM: PORTABLE CHEST 1 VIEW COMPARISON:  06/25/2021. FINDINGS: The heart size and mediastinal contours are within normal limits. Stable scarring is noted in the right upper lobe. Airspace disease is noted at the right lung base. There is atelectasis at the left lung base. No effusion or  pneumothorax is seen. No acute osseous abnormality. IMPRESSION: Airspace disease at the right lung base, concerning for pneumonia. Electronically Signed   By: Brett Fairy M.D.   On: 01/10/2022 20:20    Procedures Procedures    Medications Ordered in ED Medications  albuterol (VENTOLIN HFA) 108 (90 Base) MCG/ACT inhaler 2 puff (has no administration in time range)  magnesium sulfate IVPB 2 g 50 mL (2 g Intravenous New Bag/Given 01/10/22 1956)  albuterol (PROVENTIL) (2.5 MG/3ML) 0.083% nebulizer solution (  Not Given 01/10/22 2015)  cefTRIAXone (ROCEPHIN) 1 g in sodium chloride 0.9 % 100 mL IVPB (has no administration in time range)  azithromycin (ZITHROMAX) 500 mg in  sodium chloride 0.9 % 250 mL IVPB (has no administration in time range)  albuterol (PROVENTIL) (2.5 MG/3ML) 0.083% nebulizer solution 2.5 mg (2.5 mg Nebulization Given 01/10/22 1948)  methylPREDNISolone sodium succinate (SOLU-MEDROL) 125 mg/2 mL injection 125 mg (125 mg Intravenous Given 01/10/22 1956)  ipratropium-albuterol (DUONEB) 0.5-2.5 (3) MG/3ML nebulizer solution 3 mL (3 mLs Nebulization Given 01/10/22 1956)  LORazepam (ATIVAN) injection 0.5 mg (0.5 mg Intravenous Given 01/10/22 1959)  albuterol (PROVENTIL) (2.5 MG/3ML) 0.083% nebulizer solution 10 mg (10 mg Nebulization Given 01/10/22 2006)    ED Course/ Medical Decision Making/ A&P                           Medical Decision Making Amount and/or Complexity of Data Reviewed Labs: ordered.  Risk Prescription drug management.    Medical Screen Complete  This patient presented to the ED with complaint of respiratory distress.  This complaint involves an extensive number of treatment options. The initial differential diagnosis includes, but is not limited to, COPD exacerbation, pneumonia, metabolic abnormality, etc.  This presentation is: Acute, Chronic, Self-Limited, Previously Undiagnosed, Uncertain Prognosis, Complicated, Systemic Symptoms, and Threat to  Life/Bodily Function  Patient presents to the ED with significant respiratory distress and diffuse wheezes in all lung fields.  Patient given Solu-Medrol, albuterol, Ativan, magnesium and placed on bipap.   Immediate improvement in patient's respiratory status noted.  Patient will require admission.  Radiology is concerned the chest x-ray demonstrates possible right lower lobe infiltrate.  We will cover possible pneumonia with antibiotics.  Patient is on 10 mg of prednisone daily.  She reports full compliance with this.  Hospitalist service made aware of case.    Additional history obtained:  External records from outside sources obtained and reviewed including prior ED visits and prior Inpatient records.    Lab Tests:  I ordered and personally interpreted labs.   Imaging Studies ordered:  I ordered imaging studies including cxr  I independently visualized and interpreted obtained imaging which showed possible RLL infiltrate I agree with the radiologist interpretation.   Cardiac Monitoring:  The patient was maintained on a cardiac monitor.  I personally viewed and interpreted the cardiac monitor which showed an underlying rhythm of: sinus tach   Medicines ordered:  I ordered medication including Solu-Medrol, albuterol, magnesium, Ativan for bronchospasm Reevaluation of the patient after these medicines showed that the patient: improved    Problem List / ED Course:  Resp distress, COPD exacerbation   Reevaluation:  After the interventions noted above, I reevaluated the patient and found that they have: improved   Disposition:  After consideration of the diagnostic results and the patients response to treatment, I feel that the patent would benefit from admission.    CRITICAL CARE Performed by: Valarie Merino   Total critical care time: 30 minutes  Critical care time was exclusive of separately billable procedures and treating other  patients.  Critical care was necessary to treat or prevent imminent or life-threatening deterioration.  Critical care was time spent personally by me on the following activities: development of treatment plan with patient and/or surrogate as well as nursing, discussions with consultants, evaluation of patient's response to treatment, examination of patient, obtaining history from patient or surrogate, ordering and performing treatments and interventions, ordering and review of laboratory studies, ordering and review of radiographic studies, pulse oximetry and re-evaluation of patient's condition.         Final Clinical Impression(s) / ED Diagnoses Final  diagnoses:  Respiratory distress  COPD exacerbation [facility])    Rx / DC Orders ED Discharge Orders     None         Valarie Merino, MD 01/10/22 2053
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
## 2022-01-10 NOTE — ED Triage Notes (Signed)
Patient arrived short of breath, with audible wheezing. Patient has labored respirations, tacypnic

## 2022-01-11 DIAGNOSIS — Z87891 Personal history of nicotine dependence: Secondary | ICD-10-CM | POA: Diagnosis not present

## 2022-01-11 DIAGNOSIS — I10 Essential (primary) hypertension: Secondary | ICD-10-CM | POA: Diagnosis present

## 2022-01-11 DIAGNOSIS — E785 Hyperlipidemia, unspecified: Secondary | ICD-10-CM | POA: Diagnosis present

## 2022-01-11 DIAGNOSIS — Z823 Family history of stroke: Secondary | ICD-10-CM | POA: Diagnosis not present

## 2022-01-11 DIAGNOSIS — Z880 Allergy status to penicillin: Secondary | ICD-10-CM | POA: Diagnosis not present

## 2022-01-11 DIAGNOSIS — R0603 Acute respiratory distress: Secondary | ICD-10-CM | POA: Diagnosis present

## 2022-01-11 DIAGNOSIS — Z7952 Long term (current) use of systemic steroids: Secondary | ICD-10-CM | POA: Diagnosis not present

## 2022-01-11 DIAGNOSIS — J441 Chronic obstructive pulmonary disease with (acute) exacerbation: Secondary | ICD-10-CM | POA: Diagnosis present

## 2022-01-11 DIAGNOSIS — E669 Obesity, unspecified: Secondary | ICD-10-CM | POA: Diagnosis present

## 2022-01-11 DIAGNOSIS — Z9981 Dependence on supplemental oxygen: Secondary | ICD-10-CM | POA: Diagnosis not present

## 2022-01-11 DIAGNOSIS — Z8249 Family history of ischemic heart disease and other diseases of the circulatory system: Secondary | ICD-10-CM | POA: Diagnosis not present

## 2022-01-11 DIAGNOSIS — Z20822 Contact with and (suspected) exposure to covid-19: Secondary | ICD-10-CM | POA: Diagnosis present

## 2022-01-11 DIAGNOSIS — J189 Pneumonia, unspecified organism: Secondary | ICD-10-CM | POA: Diagnosis present

## 2022-01-11 DIAGNOSIS — Z79899 Other long term (current) drug therapy: Secondary | ICD-10-CM | POA: Diagnosis not present

## 2022-01-11 DIAGNOSIS — K219 Gastro-esophageal reflux disease without esophagitis: Secondary | ICD-10-CM | POA: Diagnosis present

## 2022-01-11 DIAGNOSIS — Z6829 Body mass index (BMI) 29.0-29.9, adult: Secondary | ICD-10-CM | POA: Diagnosis not present

## 2022-01-11 DIAGNOSIS — D638 Anemia in other chronic diseases classified elsewhere: Secondary | ICD-10-CM | POA: Diagnosis present

## 2022-01-11 DIAGNOSIS — R7303 Prediabetes: Secondary | ICD-10-CM | POA: Diagnosis present

## 2022-01-11 DIAGNOSIS — J9621 Acute and chronic respiratory failure with hypoxia: Secondary | ICD-10-CM | POA: Diagnosis present

## 2022-01-11 DIAGNOSIS — R000 Tachycardia, unspecified: Secondary | ICD-10-CM | POA: Diagnosis present

## 2022-01-11 DIAGNOSIS — Z888 Allergy status to other drugs, medicaments and biological substances status: Secondary | ICD-10-CM | POA: Diagnosis not present

## 2022-01-11 DIAGNOSIS — Z825 Family history of asthma and other chronic lower respiratory diseases: Secondary | ICD-10-CM | POA: Diagnosis not present

## 2022-01-11 DIAGNOSIS — I2510 Atherosclerotic heart disease of native coronary artery without angina pectoris: Secondary | ICD-10-CM | POA: Diagnosis present

## 2022-01-11 DIAGNOSIS — J440 Chronic obstructive pulmonary disease with acute lower respiratory infection: Secondary | ICD-10-CM | POA: Diagnosis present

## 2022-01-11 LAB — COMPREHENSIVE METABOLIC PANEL
ALT: 13 U/L (ref 0–44)
AST: 16 U/L (ref 15–41)
Albumin: 3.1 g/dL — ABNORMAL LOW (ref 3.5–5.0)
Alkaline Phosphatase: 51 U/L (ref 38–126)
Anion gap: 6 (ref 5–15)
BUN: 11 mg/dL (ref 8–23)
CO2: 26 mmol/L (ref 22–32)
Calcium: 8.6 mg/dL — ABNORMAL LOW (ref 8.9–10.3)
Chloride: 108 mmol/L (ref 98–111)
Creatinine, Ser: 0.64 mg/dL (ref 0.44–1.00)
GFR, Estimated: >60 mL/min (ref >60)
Glucose, Bld: 163 mg/dL — ABNORMAL HIGH (ref 70–99)
Potassium: 4.5 mmol/L (ref 3.5–5.1)
Sodium: 140 mmol/L (ref 135–145)
Total Bilirubin: 0.2 mg/dL — ABNORMAL LOW (ref 0.3–1.2)
Total Protein: 6.9 g/dL (ref 6.5–8.1)

## 2022-01-11 LAB — LACTIC ACID, PLASMA: Lactic Acid, Venous: 0.9 mmol/L (ref 0.5–1.9)

## 2022-01-11 LAB — CBC
HCT: 32.4 % — ABNORMAL LOW (ref 36.0–46.0)
Hemoglobin: 10.1 g/dL — ABNORMAL LOW (ref 12.0–15.0)
MCH: 28.4 pg (ref 26.0–34.0)
MCHC: 31.2 g/dL (ref 30.0–36.0)
MCV: 91 fL (ref 80.0–100.0)
Platelets: 300 10*3/uL (ref 150–400)
RBC: 3.56 MIL/uL — ABNORMAL LOW (ref 3.87–5.11)
RDW: 14 % (ref 11.5–15.5)
WBC: 8.6 10*3/uL (ref 4.0–10.5)
nRBC: 0 % (ref 0.0–0.2)

## 2022-01-11 LAB — HEMOGLOBIN A1C
Hgb A1c MFr Bld: 6.2 % — ABNORMAL HIGH (ref 4.8–5.6)
Mean Plasma Glucose: 131.24 mg/dL

## 2022-01-11 LAB — HIV ANTIBODY (ROUTINE TESTING W REFLEX): HIV Screen 4th Generation wRfx: NONREACTIVE

## 2022-01-11 MED ORDER — IPRATROPIUM-ALBUTEROL 0.5-2.5 (3) MG/3ML IN SOLN
3.0000 mL | Freq: Four times a day (QID) | RESPIRATORY_TRACT | Status: DC
Start: 2022-01-11 — End: 2022-01-12
  Administered 2022-01-11 (×3): 3 mL via RESPIRATORY_TRACT
  Filled 2022-01-11 (×3): qty 3

## 2022-01-11 MED ORDER — MONTELUKAST SODIUM 10 MG PO TABS
10.0000 mg | ORAL_TABLET | Freq: Every day | ORAL | Status: DC
  Administered 2022-01-11: 10 mg via ORAL
  Filled 2022-01-11 (×2): qty 1

## 2022-01-11 NOTE — Progress Notes (Signed)
Pt taken off BiPAP to make some phone calls and to evaluate her status without it. PT tolerating will at this time, will continue to monitor.

## 2022-01-11 NOTE — Progress Notes (Addendum)
PROGRESS NOTE   [name]  SKA:[id number]    DOB: 1960/05/22    DOA: 01/10/2022  PCP: Benito Mccreedy, MD   I have briefly reviewed patients previous medical records in [facility].  Chief Complaint  Patient presents with   Asthma    Brief Narrative:  61 year old female, lives with family, independent, works at a [employer] 5 days a week, 6 AM to 4 PM, PMH of severe COPD, chronic respiratory failure with hypoxia on home oxygen 3 L/min, former heavy smoker quit 2011, GERD, CAD, HLD, HTN, seen by her pulmonologist on 01/05/2022, presented with 1 day history of progressively worsening dyspnea, not relieved by home nebulizers.  On arrival, patient with obvious respiratory distress, tachypneic, tachycardic, wheezing.  She was given IV Solu-Medrol, albuterol, Ativan, magnesium and placed on BiPAP.  She was admitted for acute on chronic respiratory failure with hypoxia due to COPD exacerbation and community-acquired pneumonia.  Remained on BiPAP for almost 12 hours and now transition to [place] oxygen.   Assessment & Plan:  Principal Problem:   Acute on chronic respiratory failure with hypoxia (HCC) Active Problems:   CAP (community acquired pneumonia)   GERD (gastroesophageal reflux disease)   Obesity (BMI 30-39.9)   CAD (coronary artery disease)   Hyperlipidemia   COPD with acute exacerbation (HCC)   Acute on chronic respiratory failure with hypoxia due to COPD exacerbation and right lower lobe community-acquired pneumonia: Patient reports being on home oxygen 3 L/min which was recently increased to 4 L/min (pulmonology note says 5 L/min).  Of note, patient states that she uses oxygen only while at home but does not use oxygen when she is at work which is a substantial duration of no O2, as noted above.  She was counseled that she needs to use oxygen at all times.  Unclear precipitants but states that she is exposed to dust at her workplace and per pulmonology note,  exposed to D60, a gas substitute.  VBG without CO2 retention.  BNP 49, HS Troponin's x2 and lactate negative.  Mild leukocytosis/14.6 may have been due to preadmission steroids or stress response.  Coronavirus RT PCR and flu panel PCR negative.  HIV screen negative.  Blood cultures x2: Negative to date.  On arrival treated with several doses of albuterol/Atrovent/DuoNebs, IV Ativan x1, IV Solu-Medrol 125 mg x 1, IV magnesium sulfate and placed on BiPAP.  She was on BiPAP overnight of admission, taken off at around 4:30 AM 8/29 and transitioned to [place] oxygen 2 L/min.  Titrate to oxygen saturation between 89-92%.  Continue empirically started IV ceftriaxone and azithromycin for pneumonia, oral prednisone 40 mg daily, Incruse Ellipta & Dulera (substituting for home [place]), as needed albuterol nebs.  Added flutter valve.  BiPAP changed to as needed.  Monitor closely for the next 24 hours and if continues to improve and no further decline, possible DC home 8/30.  Chest x-ray will need to be followed up in 4 weeks to ensure resolution of pneumonia findings.  Pulmonology considering starting Daliresp to reduce exacerbations.  Anemia: Baseline hemoglobin probably in the 11 g range.  Hemoglobin dropped from 12.3 on admission to 10.7.  Follow CBC in AM.  GERD: Continue Pepcid  CAD: No anginal symptoms.  Hyperlipidemia: On Repatha as outpatient  Hypertension:  Not on antihypertensives PTA and controlled.  Prediabetes: A1c 6.3 in February 2011.  Check A1c.  Abnormal CT: Per pulmonology, CT in November 22 showed some inflammatory changes at the base of the  lung and the plan short term follow-up to ensure resolution.  Planning CT in October.  Obesity.    DVT prophylaxis: enoxaparin (LOVENOX) injection 40 mg Start: 01/10/22 2200     Code Status: Full Code:  Family Communication: None at bedside Disposition:  Status is: Observation The patient will require care spanning > 2 midnights and should be  moved to inpatient because: Presented with acute respiratory failure and was on BiPAP for almost 12 hours.  Meets criteria for inpatient.     Consultants:     Procedures:   BiPAP  Antimicrobials:   IV ceftriaxone and azithromycin 8/28   Subjective:  Seen this morning in the ED.  Reports that BiPAP was taken off at around 4:30 AM.  Feels much better.  Dyspnea improved and may be close to baseline.  No cough or chest pain.  Objective:   Vitals:   01/11/22 0510 01/11/22 0633 01/11/22 0801 01/11/22 0805  BP:    124/80  Pulse: 67 68  65  Resp:    19  Temp:   97.8 F (36.6 C)   TempSrc:   Oral   SpO2: 94% 98%  98%    General exam: Middle-age female, moderately built and nourished lying comfortably propped up in bed without distress. Respiratory system: Slightly harsh breath sounds but otherwise clear to auscultation without wheezing, rhonchi or crackles.  No increased work of breathing.  Able to speak in full sentences. Cardiovascular system: S1 & S2 heard, RRR. No JVD, murmurs, rubs, gallops or clicks. No pedal edema.  Telemetry personally reviewed: Sinus rhythm with occasional sinus bradycardia in the 50s. Gastrointestinal system: Abdomen is nondistended, soft and nontender. No organomegaly or masses felt. Normal bowel sounds heard. Central nervous system: Alert and oriented. No focal neurological deficits. Extremities: Symmetric 5 x 5 power. Skin: No rashes, lesions or ulcers Psychiatry: Judgement and insight appear normal. Mood & affect appropriate.     Data Reviewed:   I have personally reviewed following labs and imaging studies   CBC: Recent Labs  Lab 01/10/22 1955 01/11/22 0512  WBC 14.6* 8.6  NEUTROABS 11.2*  --   HGB 12.3 10.1*  HCT 38.9 32.4*  MCV 90.3 91.0  PLT 396 419    Basic Metabolic Panel: Recent Labs  Lab 01/10/22 1955 01/11/22 0512  NA 140 140  K 3.7 4.5  CL 101 108  CO2 28 26  GLUCOSE 127* 163*  BUN 14 11  CREATININE 0.81 0.64   CALCIUM 9.5 8.6*    Liver Function Tests: Recent Labs  Lab 01/11/22 0512  AST 16  ALT 13  ALKPHOS 51  BILITOT 0.2*  PROT 6.9  ALBUMIN 3.1*    CBG: No results for input(s): "GLUCAP" in the last 168 hours.  Microbiology Studies:   Recent Results (from the past 240 hour(s))  Resp Panel by RT-PCR (Flu A&B, Covid) Anterior Nasal Swab     Status: None   Collection Time: 01/10/22  7:57 PM   Specimen: Anterior Nasal Swab  Result Value Ref Range Status   SARS Coronavirus 2 by RT PCR NEGATIVE NEGATIVE Final    Comment: (NOTE) SARS-CoV-2 target nucleic acids are NOT DETECTED.  The SARS-CoV-2 RNA is generally detectable in upper respiratory specimens during the acute phase of infection. The lowest concentration of SARS-CoV-2 viral copies this assay can detect is 138 copies/mL. A negative result does not preclude SARS-Cov-2 infection and should not be used as the sole basis for treatment or other patient management decisions. A  negative result may occur with  improper specimen collection/handling, submission of specimen other than nasopharyngeal swab, presence of viral mutation(s) within the areas targeted by this assay, and inadequate number of viral copies(<138 copies/mL). A negative result must be combined with clinical observations, patient history, and epidemiological information. The expected result is Negative.  Fact Sheet for Patients:  EntrepreneurPulse.com.au  Fact Sheet for Healthcare Providers:  IncredibleEmployment.be  This test is no t yet approved or cleared by the [place] FDA and  has been authorized for detection and/or diagnosis of SARS-CoV-2 by FDA under an Emergency Use Authorization (EUA). This EUA will remain  in effect (meaning this test can be used) for the duration of the COVID-19 declaration under Section 564(b)(1) of the Act, 21 U.S.C.section 360bbb-3(b)(1), unless the authorization is terminated  or  revoked sooner.       Influenza A by PCR NEGATIVE NEGATIVE Final   Influenza B by PCR NEGATIVE NEGATIVE Final    Comment: (NOTE) The Xpert Xpress SARS-CoV-2/FLU/RSV plus assay is intended as an aid in the diagnosis of influenza from Nasopharyngeal swab specimens and should not be used as a sole basis for treatment. Nasal washings and aspirates are unacceptable for Xpert Xpress SARS-CoV-2/FLU/RSV testing.  Fact Sheet for Patients: EntrepreneurPulse.com.au  Fact Sheet for Healthcare Providers: IncredibleEmployment.be  This test is not yet approved or cleared by the [place] FDA and has been authorized for detection and/or diagnosis of SARS-CoV-2 by FDA under an Emergency Use Authorization (EUA). This EUA will remain in effect (meaning this test can be used) for the duration of the COVID-19 declaration under Section 564(b)(1) of the Act, 21 U.S.C. section 360bbb-3(b)(1), unless the authorization is terminated or revoked.  Performed at [facility], [place] [address] [place], [place] [address]   Culture, blood (routine x 2)     Status: None (Preliminary result)   Collection Time: 01/10/22 10:00 PM   Specimen: BLOOD  Result Value Ref Range Status   Specimen Description   Final    BLOOD SITE NOT SPECIFIED Performed at [place] [address] [place], [place] [address]    Special Requests   Final    BOTTLES DRAWN AEROBIC AND ANAEROBIC Blood Culture adequate volume Performed at [place] [address] [place], [place] [address]    Culture   Final    NO GROWTH < 12 HOURS Performed at [place] [address], [place],  [address]    Report Status PENDING  Incomplete    Radiology Studies:  DG Chest Port 1 View  Result Date: 01/10/2022 CLINICAL DATA:  Asthma, shortness of breath, audible wheezing. History of COPD. EXAM: PORTABLE CHEST 1 VIEW COMPARISON:   06/25/2021. FINDINGS: The heart size and mediastinal contours are within normal limits. Stable scarring is noted in the right upper lobe. Airspace disease is noted at the right lung base. There is atelectasis at the left lung base. No effusion or pneumothorax is seen. No acute osseous abnormality. IMPRESSION: Airspace disease at the right lung base, concerning for pneumonia. Electronically Signed   By: Brett Fairy M.D.   On: 01/10/2022 20:20    Scheduled Meds:    enoxaparin (LOVENOX) injection  40 mg Subcutaneous Q24H   famotidine  20 mg Oral QHS   ipratropium-albuterol  3 mL Nebulization Q6H   umeclidinium bromide  1 puff Inhalation Daily   And   mometasone-formoterol  2 puff Inhalation BID   predniSONE  40 mg Oral  Q breakfast   sodium chloride flush  3 mL Intravenous Q12H    Continuous Infusions:    azithromycin     cefTRIAXone (ROCEPHIN)  IV 1 g (01/11/22 0943)     LOS: 0 days     Vernell Leep, MD,  FACP, [facility], [facility], [facility] (Care Management Physician Certified) [place]  To contact the attending provider between 7A-7P or the covering provider during after hours 7P-7A, please log into the web site [email or web address] and access using universal [place] password for that web site. If you do not have the password, please call the hospital operator.  01/11/2022, 10:12 AM

## 2022-01-12 ENCOUNTER — Other Ambulatory Visit: Payer: Self-pay

## 2022-01-12 ENCOUNTER — Encounter (HOSPITAL_COMMUNITY): Payer: Self-pay | Admitting: Internal Medicine

## 2022-01-12 DIAGNOSIS — J9621 Acute and chronic respiratory failure with hypoxia: Secondary | ICD-10-CM | POA: Diagnosis not present

## 2022-01-12 LAB — CBC
HCT: 35.4 % — ABNORMAL LOW (ref 36.0–46.0)
Hemoglobin: 11.1 g/dL — ABNORMAL LOW (ref 12.0–15.0)
MCH: 28.4 pg (ref 26.0–34.0)
MCHC: 31.4 g/dL (ref 30.0–36.0)
MCV: 90.5 fL (ref 80.0–100.0)
Platelets: 335 10*3/uL (ref 150–400)
RBC: 3.91 MIL/uL (ref 3.87–5.11)
RDW: 14 % (ref 11.5–15.5)
WBC: 11.3 10*3/uL — ABNORMAL HIGH (ref 4.0–10.5)
nRBC: 0 % (ref 0.0–0.2)

## 2022-01-12 MED ORDER — PREDNISONE 10 MG PO TABS: ORAL_TABLET | ORAL | 0 refills | Status: AC

## 2022-01-12 MED ORDER — PREDNISONE 10 MG PO TABS: 10.0000 mg | ORAL_TABLET | Freq: Every day | ORAL | 2 refills | Status: DC

## 2022-01-12 MED ORDER — ENSURE ENLIVE PO LIQD: 237.0000 mL | ORAL | Status: DC

## 2022-01-12 MED ORDER — HYDROCOD POLI-CHLORPHE POLI ER 10-8 MG/5ML PO SUER
5.0000 mL | Freq: Two times a day (BID) | ORAL | Status: DC | PRN
  Administered 2022-01-12: 5 mL via ORAL
  Filled 2022-01-12: qty 5

## 2022-01-12 MED ORDER — GUAIFENESIN ER 600 MG PO TB12
600.0000 mg | ORAL_TABLET | Freq: Two times a day (BID) | ORAL | Status: DC
  Administered 2022-01-12: 600 mg via ORAL
  Filled 2022-01-12: qty 1

## 2022-01-12 MED ORDER — FUROSEMIDE 10 MG/ML IJ SOLN
20.0000 mg | Freq: Once | INTRAMUSCULAR | Status: AC
  Administered 2022-01-12: 20 mg via INTRAVENOUS
  Filled 2022-01-12: qty 2

## 2022-01-12 MED ORDER — IPRATROPIUM-ALBUTEROL 0.5-2.5 (3) MG/3ML IN SOLN
3.0000 mL | Freq: Three times a day (TID) | RESPIRATORY_TRACT | Status: DC
Start: 2022-01-12 — End: 2022-01-12
  Administered 2022-01-12: 3 mL via RESPIRATORY_TRACT
  Filled 2022-01-12: qty 3

## 2022-01-12 MED ORDER — GUAIFENESIN ER 600 MG PO TB12: 600.0000 mg | ORAL_TABLET | Freq: Two times a day (BID) | ORAL | 0 refills | Status: AC

## 2022-01-12 MED ORDER — CEFDINIR 300 MG PO CAPS: 300.0000 mg | ORAL_CAPSULE | Freq: Two times a day (BID) | ORAL | 0 refills | Status: AC

## 2022-01-12 MED ORDER — AZITHROMYCIN 500 MG PO TABS: 500.0000 mg | ORAL_TABLET | Freq: Every day | ORAL | 0 refills | Status: AC

## 2022-01-12 NOTE — Discharge Summary (Signed)
Physician Discharge Summary  [name] VQM:[id number] DOB: 1960/11/02 DOA: 01/10/2022  PCP: Benito Mccreedy, MD  Admit date: 01/10/2022 Discharge date: 01/12/2022  Admitted From: Home Disposition: Home  Recommendations for Outpatient Follow-up:  Follow up with PCP in 1-2 weeks Follow-up with pulmonology, Dr. [name] as scheduled Recommend follow-up chest x-ray 4 weeks to ensure resolution of pneumonia  Home Health: No Equipment/Devices: Oxygen 3-5 L [place]  Discharge Condition: Stable CODE STATUS: Full code Diet recommendation: Heart healthy diet  History of present illness:  [name] is a 61 year old female with past medical history significant for severe COPD on 3-5 L/min oxygen via nasal cannula, HTN, HLD, CAD, GERD, former heavy smoker quit 2011 who presented to ED on 8/28 with 1 day history of progressive shortness of breath not relieved with her home nebulizers.  Patient reports recently seen by her pulmonologist on 01/05/2022 and is pending new prescription for Daliresp.  Patient denies any sick contacts and is compliant with her home medications.  On arrival, patient with obvious respiratory distress in which she was tachypneic, tachycardic, and wheezing.  Patient was given IV Solu-Medrol, albuterol, Ativan, magnesium and placed on BiPAP.  TRH consulted for further evaluation and management of acute on chronic respiratory failure with hypoxia secondary to COPD exacerbation and community-acquired pneumonia.  Hospital course:  Acute on chronic respiratory failure with hypoxia COPD exacerbation Community acquired pneumonia Patient presenting to ED with 1 day history of progressive shortness of breath in which she was found to be tachypneic, tachycardic and wheezing.  Patient reports home oxygen use of 3 L/min which was recently increased to 5 L/min by pulmonology on 01/05/2022.  Unclear precipitating event but patient with dust exposure at her workplace.  COVID-19/influenza  A/B PCR negative.  Mild leukocytosis with WBC count of 14.6.  Lactic acid 0.9.  Chest x-ray with airspace disease right lung base concerning for pneumonia.  Patient was treated with albuterol, Atrovent, DuoNebs, IV Solu-Medrol, IV magnesium and placed on BiPAP.  Additionally patient was started on empiric antibiotics with IV ceftriaxone and azithromycin.  Patient's symptoms improved gradually was able to be titrated off of BiPAP back to supplemental oxygen.  Patient now close to her typical baseline and discussed with patient needs to continue to adhere to her home oxygen regimen.  Will discharge home on azithromycin and cefdinir to complete antibiotic course.  We will continue prednisone taper on discharge and will taper down until continues on 10 mg p.o. daily as per recommendations from her pulmonologist.  Continue Breztri 2 puffs twice daily, Singulair.  Patient continues to await starting Daliresp to reduce her exacerbations, apparently pending insurance authorization.  Recommend repeat chest x-ray in 4 weeks to ensure resolution of pneumonia.  Anemia secondary to chronic disease Hemoglobin 11.1, stable.  GERD: Continue Pepcid  CAD Hyperlipidemia Continue Repatha as outpatient  History of essential hypertension Not on antihypertensives currently outpatient, controlled.  Outpatient follow-up with PCP.  Prediabetes Hemoglobin A1c 6.2.  Continue diet control.  Outpatient follow-up with PCP.  Abnormal CT Per pulmonology, CT in November 2022 showed some inflammatory changes at the base of the lung and plan for short-term follow-up to ensure resolution.  Planning CT October 2023.  Outpatient follow-up with pulmonology.  Obesity Body mass index is 29.02 kg/m.  Discussed with patient needs for aggressive lifestyle changes/weight loss as this complicates all facets of care.  Outpatient follow-up with PCP.     Discharge Diagnoses:  Principal Problem:   Acute on chronic respiratory failure with  hypoxia (  [name]) Active Problems:   CAP (community acquired pneumonia)   GERD (gastroesophageal reflux disease)   Obesity (BMI 30-39.9)   CAD (coronary artery disease)   Hyperlipidemia   COPD with acute exacerbation [facility])    Discharge Instructions  Discharge Instructions     Call MD for:  difficulty breathing, headache or visual disturbances   Complete by: As directed    Call MD for:  extreme fatigue   Complete by: As directed    Call MD for:  persistant dizziness or light-headedness   Complete by: As directed    Call MD for:  persistant nausea and vomiting   Complete by: As directed    Call MD for:  severe uncontrolled pain   Complete by: As directed    Call MD for:  temperature >100.4   Complete by: As directed    Diet - low sodium heart healthy   Complete by: As directed    Increase activity slowly   Complete by: As directed       Allergies as of 01/12/2022       Reactions   Amoxicillin Shortness Of Breath   Tylenol [acetaminophen] Shortness Of Breath   Statins    Zetia [ezetimibe]         Medication List     TAKE these medications    albuterol (2.5 MG/3ML) 0.083% nebulizer solution Commonly known as: PROVENTIL USE 1 VIAL IN NEBULIZER EVERY 6 HOURS - as needed for wheezing or shortness of breath What changed: See the new instructions.   albuterol 108 (90 Base) MCG/ACT inhaler Commonly known as: VENTOLIN HFA INHALE 2 PUFFS BY MOUTH INTO THE LUNGS EVERY 4 HOURS AS NEEDED FOR WHEEZE OR FOR SHORTNESS OF BREATH What changed: See the new instructions.   azithromycin 500 MG tablet Commonly known as: Zithromax Take 1 tablet (500 mg total) by mouth daily for 3 days. Take 1 tablet daily for 3 days.   Breztri Aerosphere 160-9-4.8 MCG/ACT Aero Generic drug: Budeson-Glycopyrrol-Formoterol TAKE 2 PUFFS BY MOUTH TWICE A DAY What changed:  how much to take how to take this when to take this   cefdinir 300 MG capsule Commonly known as: OMNICEF Take 1 capsule  (300 mg total) by mouth 2 (two) times daily for 5 days. Start taking on: January 13, 2022   famotidine 20 MG tablet Commonly known as: PEPCID Take 1 tablet (20 mg total) by mouth at bedtime.   guaiFENesin 600 MG 12 hr tablet Commonly known as: MUCINEX Take 1 tablet (600 mg total) by mouth 2 (two) times daily for 7 days.   ibuprofen 200 MG tablet Commonly known as: ADVIL Take 400 mg by mouth every 6 (six) hours as needed for fever, headache or mild pain.   isosorbide mononitrate 30 MG 24 hr tablet Commonly known as: IMDUR Take 30 mg by mouth daily.   montelukast 10 MG tablet Commonly known as: SINGULAIR TAKE 1 TABLET BY MOUTH EVERYDAY AT BEDTIME What changed: See the new instructions.   nitroGLYCERIN 0.4 MG SL tablet Commonly known as: NITROSTAT Place 1 tablet (0.4 mg total) under the tongue every 5 (five) minutes as needed for chest pain.   OXYGEN Inhale 3 L into the lungs at bedtime.   predniSONE 10 MG tablet Commonly known as: DELTASONE Take 4 tablets (40 mg total) by mouth daily for 4 days, THEN 3 tablets (30 mg total) daily for 3 days, THEN 2 tablets (20 mg total) daily for 3 days. Start taking on: January 13, 2022  What changed: You were already taking a medication with the same name, and this prescription was added. Make sure you understand how and when to take each.   predniSONE 10 MG tablet Commonly known as: DELTASONE Take 1 tablet (10 mg total) by mouth daily with breakfast. Start taking on: January 23, 2022 What changed: These instructions start on January 23, 2022. If you are unsure what to do until then, ask your doctor or other care provider.   Repatha SureClick 628 MG/ML Soaj Generic drug: Evolocumab Inject 140 mg into the skin every 14 (fourteen) days.   Roflumilast 250 MCG Tabs Commonly known as: Investment banker, operational '250mg'$  daily for 2 weeks   roflumilast 500 MCG Tabs tablet Commonly known as: DALIRESP Start '500mg'$  daily after 2 weeks of '250mg'$          Follow-up Information     Osei-Bonsu, George, MD. Schedule an appointment as soon as possible for a visit in 1 week(s).   Specialty: Internal Medicine Contact information: [place] [address] [phone]         Marshell Garfinkel, MD. Schedule an appointment as soon as possible for a visit in 2 week(s).   Specialty: Pulmonary Disease Contact information: [address] [place] [address] [phone]                Allergies  Allergen Reactions   Amoxicillin Shortness Of Breath   Tylenol [Acetaminophen] Shortness Of Breath   Statins    Zetia [Ezetimibe]     Consultations: None   Procedures/Studies: DG Chest Port 1 View  Result Date: 01/10/2022 CLINICAL DATA:  Asthma, shortness of breath, audible wheezing. History of COPD. EXAM: PORTABLE CHEST 1 VIEW COMPARISON:  06/25/2021. FINDINGS: The heart size and mediastinal contours are within normal limits. Stable scarring is noted in the right upper lobe. Airspace disease is noted at the right lung base. There is atelectasis at the left lung base. No effusion or pneumothorax is seen. No acute osseous abnormality. IMPRESSION: Airspace disease at the right lung base, concerning for pneumonia. Electronically Signed   By: Brett Fairy M.D.   On: 01/10/2022 20:20     Subjective: Patient seen examined bedside, resting comfortably.  Remains on her home oxygen via nasal cannula.  Reports that her breathing is close to her baseline.  Ready for discharge home.  No other questions or concerns at this time.  Denies headache, no dizziness, no visual changes, no chest pain, no palpitations, no shortness of breath greater than her typical baseline, no fever/chills/night sweats, no nausea/vomiting/diarrhea, no abdominal pain, no focal weakness, no fatigue, no paresthesias.  No acute events overnight per nursing staff.  Discharge Exam: Vitals:   01/12/22 0512 01/12/22 0832  BP:    Pulse:    Resp:     Temp:    SpO2: 100% 99%   Vitals:   01/11/22 1855 01/12/22 0508 01/12/22 0512 01/12/22 0832  BP:  134/78    Pulse:  63    Resp:  15    Temp:  97.9 F (36.6 C)    TempSrc:  Oral    SpO2:  99% 100% 99%  Weight: 74.3 kg     Height: '5\' 3"'$  (1.6 m)       Physical Exam: GEN: NAD, alert and oriented x 3, elderly/chronically ill in appearance HEENT: NCAT, PERRL, EOMI, sclera clear, MMM PULM: Breath sounds slight diminished bilateral bases, no significant appreciable wheezing, no crackles, normal respiratory effort without accessory muscle  use, on 4 L nasal cannula with SPO2 100% at rest CV: RRR w/o M/G/R GI: abd soft, NTND, NABS, no R/G/M MSK: no peripheral edema, muscle strength globally intact 5/5 bilateral upper/lower extremities NEURO: CN II-XII intact, no focal deficits, sensation to light touch intact PSYCH: normal mood/affect Integumentary: dry/intact, no rashes or wounds    The results of significant diagnostics from this hospitalization (including imaging, microbiology, ancillary and laboratory) are listed below for reference.     Microbiology: Recent Results (from the past 240 hour(s))  Resp Panel by RT-PCR (Flu A&B, Covid) Anterior Nasal Swab     Status: None   Collection Time: 01/10/22  7:57 PM   Specimen: Anterior Nasal Swab  Result Value Ref Range Status   SARS Coronavirus 2 by RT PCR NEGATIVE NEGATIVE Final    Comment: (NOTE) SARS-CoV-2 target nucleic acids are NOT DETECTED.  The SARS-CoV-2 RNA is generally detectable in upper respiratory specimens during the acute phase of infection. The lowest concentration of SARS-CoV-2 viral copies this assay can detect is 138 copies/mL. A negative result does not preclude SARS-Cov-2 infection and should not be used as the sole basis for treatment or other patient management decisions. A negative result may occur with  improper specimen collection/handling, submission of specimen other than nasopharyngeal swab, presence of  viral mutation(s) within the areas targeted by this assay, and inadequate number of viral copies(<138 copies/mL). A negative result must be combined with clinical observations, patient history, and epidemiological information. The expected result is Negative.  Fact Sheet for Patients:  EntrepreneurPulse.com.au  Fact Sheet for Healthcare Providers:  IncredibleEmployment.be  This test is no t yet approved or cleared by the [place] FDA and  has been authorized for detection and/or diagnosis of SARS-CoV-2 by FDA under an Emergency Use Authorization (EUA). This EUA will remain  in effect (meaning this test can be used) for the duration of the COVID-19 declaration under Section 564(b)(1) of the Act, 21 U.S.C.section 360bbb-3(b)(1), unless the authorization is terminated  or revoked sooner.       Influenza A by PCR NEGATIVE NEGATIVE Final   Influenza B by PCR NEGATIVE NEGATIVE Final    Comment: (NOTE) The Xpert Xpress SARS-CoV-2/FLU/RSV plus assay is intended as an aid in the diagnosis of influenza from Nasopharyngeal swab specimens and should not be used as a sole basis for treatment. Nasal washings and aspirates are unacceptable for Xpert Xpress SARS-CoV-2/FLU/RSV testing.  Fact Sheet for Patients: EntrepreneurPulse.com.au  Fact Sheet for Healthcare Providers: IncredibleEmployment.be  This test is not yet approved or cleared by the [place] FDA and has been authorized for detection and/or diagnosis of SARS-CoV-2 by FDA under an Emergency Use Authorization (EUA). This EUA will remain in effect (meaning this test can be used) for the duration of the COVID-19 declaration under Section 564(b)(1) of the Act, 21 U.S.C. section 360bbb-3(b)(1), unless the authorization is terminated or revoked.  Performed at [facility], [place] [address], [place], [place] [address]   Culture, blood  (routine x 2)     Status: None (Preliminary result)   Collection Time: 01/10/22 10:00 PM   Specimen: BLOOD  Result Value Ref Range Status   Specimen Description   Final    BLOOD SITE NOT SPECIFIED Performed at [facility] [address] [place] [address], [place], [place] [address]    Special Requests   Final    BOTTLES DRAWN AEROBIC AND ANAEROBIC Blood Culture adequate volume Performed at [facility] [address]  [place], [place] [address]    Culture   Final    NO GROWTH 2 DAYS Performed at [facility], [place] [address], [place], [place] [address]    Report Status PENDING  Incomplete  Culture, blood (routine x 2)     Status: None (Preliminary result)   Collection Time: 01/11/22  7:29 PM   Specimen: BLOOD  Result Value Ref Range Status   Specimen Description   Final    BLOOD BLOOD LEFT ARM Performed at [facility] [address], [place], [place] [address]    Special Requests   Final    BOTTLES DRAWN AEROBIC ONLY Blood Culture adequate volume Performed at [facility] [address], [place], [place] [address]    Culture   Final    NO GROWTH < 12 HOURS Performed at [facility] [address] [place],  [address]    Report Status PENDING  Incomplete     Labs: BNP (last 3 results) Recent Labs    01/10/22 1955  BNP 21.1   Basic Metabolic Panel: Recent Labs  Lab 01/10/22 1955 01/11/22 0512  NA 140 140  K 3.7 4.5  CL 101 108  CO2 28 26  GLUCOSE 127* 163*  BUN 14 11  CREATININE 0.81 0.64  CALCIUM 9.5 8.6*   Liver Function Tests: Recent Labs  Lab 01/11/22 0512  AST 16  ALT 13  ALKPHOS 51  BILITOT 0.2*  PROT 6.9  ALBUMIN 3.1*   No results for input(s): "LIPASE", "AMYLASE" in the last 168 hours. No results for input(s): "AMMONIA" in the last 168 hours. CBC: Recent Labs  Lab 01/10/22 1955 01/11/22 0512 01/12/22 0356  WBC 14.6* 8.6 11.3*  NEUTROABS 11.2*  --   --    HGB 12.3 10.1* 11.1*  HCT 38.9 32.4* 35.4*  MCV 90.3 91.0 90.5  PLT 396 300 335   Cardiac Enzymes: No results for input(s): "CKTOTAL", "CKMB", "CKMBINDEX", "TROPONINI" in the last 168 hours. BNP: Invalid input(s): "POCBNP" CBG: No results for input(s): "GLUCAP" in the last 168 hours. D-Dimer No results for input(s): "DDIMER" in the last 72 hours. Hgb A1c Recent Labs    01/11/22 1032  HGBA1C 6.2*   Lipid Profile No results for input(s): "CHOL", "HDL", "LDLCALC", "TRIG", "CHOLHDL", "LDLDIRECT" in the last 72 hours. Thyroid function studies No results for input(s): "TSH", "T4TOTAL", "T3FREE", "THYROIDAB" in the last 72 hours.  Invalid input(s): "FREET3" Anemia work up No results for input(s): "VITAMINB12", "FOLATE", "FERRITIN", "TIBC", "IRON", "RETICCTPCT" in the last 72 hours. Urinalysis    Component Value Date/Time   COLORURINE STRAW (A) 01/08/2020 1129   APPEARANCEUR CLEAR 01/08/2020 1129   LABSPEC 1.002 (L) 01/08/2020 1129   PHURINE 6.0 01/08/2020 1129   GLUCOSEU NEGATIVE 01/08/2020 1129   HGBUR NEGATIVE 01/08/2020 1129   BILIRUBINUR neg 04/24/2020 1520   KETONESUR NEGATIVE 01/08/2020 1129   PROTEINUR Negative 04/24/2020 1520   PROTEINUR NEGATIVE 01/08/2020 1129   UROBILINOGEN negative (A) 04/24/2020 1520   UROBILINOGEN 0.2 03/07/2015 1830   NITRITE neg 04/24/2020 1520   NITRITE NEGATIVE 01/08/2020 1129   LEUKOCYTESUR Trace (A) 04/24/2020 1520   LEUKOCYTESUR MODERATE (A) 01/08/2020 1129   Sepsis Labs Recent Labs  Lab 01/10/22 1955 01/11/22 0512 01/12/22 0356  WBC 14.6* 8.6 11.3*   Microbiology Recent Results (from the past 240 hour(s))  Resp Panel by RT-PCR (Flu A&B, Covid) Anterior Nasal Swab     Status: None   Collection Time: 01/10/22  7:57  PM   Specimen: Anterior Nasal Swab  Result Value Ref Range Status   SARS Coronavirus 2 by RT PCR NEGATIVE NEGATIVE Final    Comment: (NOTE) SARS-CoV-2 target nucleic acids are NOT DETECTED.  The SARS-CoV-2 RNA  is generally detectable in upper respiratory specimens during the acute phase of infection. The lowest concentration of SARS-CoV-2 viral copies this assay can detect is 138 copies/mL. A negative result does not preclude SARS-Cov-2 infection and should not be used as the sole basis for treatment or other patient management decisions. A negative result may occur with  improper specimen collection/handling, submission of specimen other than nasopharyngeal swab, presence of viral mutation(s) within the areas targeted by this assay, and inadequate number of viral copies(<138 copies/mL). A negative result must be combined with clinical observations, patient history, and epidemiological information. The expected result is Negative.  Fact Sheet for Patients:  EntrepreneurPulse.com.au  Fact Sheet for Healthcare Providers:  IncredibleEmployment.be  This test is no t yet approved or cleared by the [place] FDA and  has been authorized for detection and/or diagnosis of SARS-CoV-2 by FDA under an Emergency Use Authorization (EUA). This EUA will remain  in effect (meaning this test can be used) for the duration of the COVID-19 declaration under Section 564(b)(1) of the Act, 21 U.S.C.section 360bbb-3(b)(1), unless the authorization is terminated  or revoked sooner.       Influenza A by PCR NEGATIVE NEGATIVE Final   Influenza B by PCR NEGATIVE NEGATIVE Final    Comment: (NOTE) The Xpert Xpress SARS-CoV-2/FLU/RSV plus assay is intended as an aid in the diagnosis of influenza from Nasopharyngeal swab specimens and should not be used as a sole basis for treatment. Nasal washings and aspirates are unacceptable for Xpert Xpress SARS-CoV-2/FLU/RSV testing.  Fact Sheet for Patients: EntrepreneurPulse.com.au  Fact Sheet for Healthcare Providers: IncredibleEmployment.be  This test is not yet approved or cleared by the  [place] FDA and has been authorized for detection and/or diagnosis of SARS-CoV-2 by FDA under an Emergency Use Authorization (EUA). This EUA will remain in effect (meaning this test can be used) for the duration of the COVID-19 declaration under Section 564(b)(1) of the Act, 21 U.S.C. section 360bbb-3(b)(1), unless the authorization is terminated or revoked.  Performed at [facility], [place] [address] [place], [place] [address]   Culture, blood (routine x 2)     Status: None (Preliminary result)   Collection Time: 01/10/22 10:00 PM   Specimen: BLOOD  Result Value Ref Range Status   Specimen Description   Final    BLOOD SITE NOT SPECIFIED Performed at [facility] [address] [place], [place] [address]    Special Requests   Final    BOTTLES DRAWN AEROBIC AND ANAEROBIC Blood Culture adequate volume Performed at [facility] [address], [place], [place] [address]    Culture   Final    NO GROWTH 2 DAYS Performed at [facility] [address] [place], [place] [address]    Report Status PENDING  Incomplete  Culture, blood (routine x 2)     Status: None (Preliminary result)   Collection Time: 01/11/22  7:29 PM   Specimen: BLOOD  Result Value Ref Range Status   Specimen Description   Final    BLOOD BLOOD LEFT ARM Performed at [facility] [address] [place], [place] [address]    Special Requests   Final    BOTTLES DRAWN AEROBIC ONLY Blood  Culture adequate volume Performed at [facility] [address] [place], [place] [address]    Culture   Final    NO GROWTH < 12 HOURS Performed at [facility] [address], [place], [place] [address]    Report Status PENDING  Incomplete     Time coordinating discharge: Over 30 minutes  SIGNED:   Zannie Locastro J [place] DO  [facility] 01/12/2022, 10:51 AM

## 2022-01-12 NOTE — TOC Transition Note (Signed)
Transition of Care [facility]) - CM/SW Discharge Note   Patient Details  Name: [name] MRN: [id number] Date of Birth: April 15, 1961  [facility] of [facility] CM/SW Contact:  Leeroy Cha, RN Phone Number: 01/12/2022, 11:23 AM   Clinical Narrative:    [id number] discharged to return home.  Chart reviewed for TOC needs.  None found.  Patient self care.   Final next level of care: Home/Self Care Barriers to Discharge: Barriers Resolved   Patient Goals and CMS Choice Patient states their goals for this hospitalization and ongoing recovery are:: to go back to my home CMS Medicare.gov Compare Post Acute Care list provided to:: Patient Choice offered to / list presented to : Patient  Discharge Placement                       Discharge Plan and Services   Discharge Planning Services: CM Consult                                 Social Determinants of Health (SDOH) Interventions     Readmission Risk Interventions   No data to display

## 2022-01-12 NOTE — Progress Notes (Signed)
Patient discharging home.  Reviewed AVS and medications, instructed to follow up with PCP and pulmonary MD.  Patient verbalizes understanding.  No questions at this time.  Patient in NAD at this time, O2 sats 96% on RA.

## 2022-01-12 NOTE — Progress Notes (Signed)
Mobility Specialist - Progress Note   01/12/22 1015  Mobility  HOB Elevated/Bed Position Self regulated  Activity Ambulated independently in hallway  Range of Motion/Exercises Active  Level of Assistance Independent  Assistive Device None  Distance Ambulated (ft) 200 ft  Activity Response Tolerated well  Transport method Ambulatory  $Mobility charge 1 Mobility   Pt received in bed and agreeable to mobility.  Pt to bed after session with all needs met.    Pre-mobility: 90% SpO2 During mobility: HR, BP,  98-100% SpO2    Set designer
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
## 2022-01-15 LAB — CULTURE, BLOOD (ROUTINE X 2)
Culture: NO GROWTH
Special Requests: ADEQUATE

## 2022-01-16 LAB — CULTURE, BLOOD (ROUTINE X 2)
Culture: NO GROWTH
Special Requests: ADEQUATE

## 2022-01-25 ENCOUNTER — Telehealth: Payer: Self-pay | Admitting: Pulmonary Disease

## 2022-01-26 NOTE — Telephone Encounter (Signed)
Patient is requesting to switched from [facility] to Symbicort. Patient stated she can not afford Breztri this month, because she is now in doughnut hole.  Patient is requesting Symbicort prescription to be sent to [place].  Message routed to Dr. [name] to advise on switch

## 2022-01-27 MED ORDER — SPIRIVA RESPIMAT 2.5 MCG/ACT IN AERS: 2.0000 | INHALATION_SPRAY | Freq: Every day | RESPIRATORY_TRACT | 3 refills | Status: DC

## 2022-01-27 MED ORDER — BUDESONIDE-FORMOTEROL FUMARATE 160-4.5 MCG/ACT IN AERO: 2.0000 | INHALATION_SPRAY | Freq: Two times a day (BID) | RESPIRATORY_TRACT | 6 refills | Status: DC

## 2022-01-27 NOTE — Telephone Encounter (Signed)
Can you send in Symbicort 160/4.5 prescription.  She will also need Spiriva prescription to go with that as she is at high risk for exacerbations.

## 2022-01-27 NOTE — Telephone Encounter (Signed)
Medication changes ordered per PM. Nothing further needed

## 2022-02-02 ENCOUNTER — Telehealth: Payer: Self-pay | Admitting: Pulmonary Disease

## 2022-02-02 NOTE — Telephone Encounter (Signed)
I've received this message sent by Terri with [employer]. Please advise  "I spoke with the patient and informed her on 5lpm that she would be switched to the portable tanks. She stated she did not want the tanks and what if she put it on 4LPM. I then explained that the POC batteries do not last very long on 4 and 5lpm. She stated she did not want me to do anything. She was going to reach out to the office"   Will document on the referral order

## 2022-02-23 ENCOUNTER — Ambulatory Visit (HOSPITAL_COMMUNITY)
Admission: RE | Admit: 2022-02-23 | Discharge: 2022-02-23 | Disposition: A | Discharge status: Home / Self Care | Payer: BC Managed Care – PPO | Source: Ambulatory Visit | Attending: Internal Medicine | Admitting: Internal Medicine

## 2022-02-23 DIAGNOSIS — R911 Solitary pulmonary nodule: Secondary | ICD-10-CM | POA: Insufficient documentation

## 2022-02-23 MED ORDER — IOHEXOL 300 MG/ML  SOLN
75.0000 mL | Freq: Once | INTRAMUSCULAR | Status: AC | PRN
  Administered 2022-02-23: 75 mL via INTRAVENOUS

## 2022-03-07 ENCOUNTER — Telehealth: Payer: Self-pay | Admitting: Pulmonary Disease

## 2022-03-07 ENCOUNTER — Other Ambulatory Visit: Payer: Self-pay | Admitting: Pulmonary Disease

## 2022-03-07 ENCOUNTER — Encounter: Payer: Self-pay | Admitting: *Deleted

## 2022-03-07 ENCOUNTER — Other Ambulatory Visit: Payer: Self-pay | Admitting: Cardiology

## 2022-03-07 DIAGNOSIS — E7800 Pure hypercholesterolemia, unspecified: Secondary | ICD-10-CM

## 2022-03-07 DIAGNOSIS — G720 Drug-induced myopathy: Secondary | ICD-10-CM

## 2022-03-07 DIAGNOSIS — I2510 Atherosclerotic heart disease of native coronary artery without angina pectoris: Secondary | ICD-10-CM

## 2022-03-07 DIAGNOSIS — R911 Solitary pulmonary nodule: Secondary | ICD-10-CM

## 2022-03-07 DIAGNOSIS — Z789 Other specified health status: Secondary | ICD-10-CM

## 2022-03-07 DIAGNOSIS — I25118 Atherosclerotic heart disease of native coronary artery with other forms of angina pectoris: Secondary | ICD-10-CM

## 2022-03-07 NOTE — Telephone Encounter (Signed)
Pt is requesting results from CT Scan on 10/11. Please advise

## 2022-03-08 NOTE — Telephone Encounter (Signed)
Called and spoke with patient.  Dr. Matilde Bash results and recommendations given. Understanding stated. CT chest order placed for 6 months. Nothing further at this time.

## 2022-03-08 NOTE — Telephone Encounter (Signed)
Called the pt and there was no answer- LMTCB    

## 2022-03-08 NOTE — Telephone Encounter (Signed)
CT shows improvement in opacity in the right lung which is good news.  This indicates that this is a resolving pneumonia.  Please order follow-up CT without contrast in 6 months.

## 2022-03-22 ENCOUNTER — Other Ambulatory Visit: Payer: Self-pay

## 2022-03-22 ENCOUNTER — Emergency Department (HOSPITAL_COMMUNITY): Payer: BC Managed Care – PPO

## 2022-03-22 ENCOUNTER — Encounter (HOSPITAL_COMMUNITY): Payer: Self-pay | Admitting: Emergency Medicine

## 2022-03-22 ENCOUNTER — Emergency Department (HOSPITAL_COMMUNITY)
Admission: EM | Admit: 2022-03-22 | Discharge: 2022-03-22 | Disposition: A | Discharge status: Home / Self Care | Payer: BC Managed Care – PPO | Attending: Emergency Medicine | Admitting: Emergency Medicine

## 2022-03-22 DIAGNOSIS — R0602 Shortness of breath: Secondary | ICD-10-CM | POA: Diagnosis present

## 2022-03-22 DIAGNOSIS — J441 Chronic obstructive pulmonary disease with (acute) exacerbation: Secondary | ICD-10-CM | POA: Diagnosis not present

## 2022-03-22 DIAGNOSIS — I2510 Atherosclerotic heart disease of native coronary artery without angina pectoris: Secondary | ICD-10-CM | POA: Diagnosis not present

## 2022-03-22 DIAGNOSIS — Z7951 Long term (current) use of inhaled steroids: Secondary | ICD-10-CM | POA: Diagnosis not present

## 2022-03-22 LAB — CBC WITH DIFFERENTIAL/PLATELET
Abs Immature Granulocytes: 0.04 10*3/uL (ref 0.00–0.07)
Basophils Absolute: 0 10*3/uL (ref 0.0–0.1)
Basophils Relative: 1 %
Eosinophils Absolute: 0.1 10*3/uL (ref 0.0–0.5)
Eosinophils Relative: 1 %
HCT: 43 % (ref 36.0–46.0)
Hemoglobin: 13.4 g/dL (ref 12.0–15.0)
Immature Granulocytes: 1 %
Lymphocytes Relative: 32 %
Lymphs Abs: 2.7 10*3/uL (ref 0.7–4.0)
MCH: 29.1 pg (ref 26.0–34.0)
MCHC: 31.2 g/dL (ref 30.0–36.0)
MCV: 93.3 fL (ref 80.0–100.0)
Monocytes Absolute: 0.7 10*3/uL (ref 0.1–1.0)
Monocytes Relative: 8 %
Neutro Abs: 4.9 10*3/uL (ref 1.7–7.7)
Neutrophils Relative %: 57 %
Platelets: 291 10*3/uL (ref 150–400)
RBC: 4.61 MIL/uL (ref 3.87–5.11)
RDW: 15.2 % (ref 11.5–15.5)
WBC: 8.4 10*3/uL (ref 4.0–10.5)
nRBC: 0 % (ref 0.0–0.2)

## 2022-03-22 LAB — BLOOD GAS, VENOUS
Acid-Base Excess: 7.4 mmol/L — ABNORMAL HIGH (ref 0.0–2.0)
Bicarbonate: 34.7 mmol/L — ABNORMAL HIGH (ref 20.0–28.0)
O2 Saturation: 48.5 %
Patient temperature: 37
pCO2, Ven: 60 mmHg (ref 44–60)
pH, Ven: 7.37 (ref 7.25–7.43)
pO2, Ven: 31 mmHg — CL (ref 32–45)

## 2022-03-22 LAB — BASIC METABOLIC PANEL
Anion gap: 10 (ref 5–15)
BUN: 13 mg/dL (ref 8–23)
CO2: 31 mmol/L (ref 22–32)
Calcium: 9.3 mg/dL (ref 8.9–10.3)
Chloride: 99 mmol/L (ref 98–111)
Creatinine, Ser: 0.76 mg/dL (ref 0.44–1.00)
GFR, Estimated: >60 mL/min (ref >60)
Glucose, Bld: 90 mg/dL (ref 70–99)
Potassium: 3.8 mmol/L (ref 3.5–5.1)
Sodium: 140 mmol/L (ref 135–145)

## 2022-03-22 MED ORDER — PREDNISONE 20 MG PO TABS: 40.0000 mg | ORAL_TABLET | Freq: Every day | ORAL | 0 refills | Status: DC

## 2022-03-22 MED ORDER — MAGNESIUM SULFATE 2 GM/50ML IV SOLN
2.0000 g | Freq: Once | INTRAVENOUS | Status: AC
  Administered 2022-03-22: 2 g via INTRAVENOUS
  Filled 2022-03-22: qty 50

## 2022-03-22 MED ORDER — ALBUTEROL SULFATE HFA 108 (90 BASE) MCG/ACT IN AERS
2.0000 | INHALATION_SPRAY | RESPIRATORY_TRACT | Status: DC | PRN
  Administered 2022-03-22: 2 via RESPIRATORY_TRACT
  Filled 2022-03-22: qty 6.7

## 2022-03-22 MED ORDER — METHYLPREDNISOLONE SODIUM SUCC 125 MG IJ SOLR
125.0000 mg | Freq: Once | INTRAMUSCULAR | Status: AC
  Administered 2022-03-22: 125 mg via INTRAVENOUS
  Filled 2022-03-22: qty 2

## 2022-03-22 MED ORDER — IPRATROPIUM BROMIDE 0.02 % IN SOLN
0.5000 mg | Freq: Once | RESPIRATORY_TRACT | Status: AC
  Administered 2022-03-22: 0.5 mg via RESPIRATORY_TRACT
  Filled 2022-03-22: qty 2.5

## 2022-03-22 MED ORDER — ALBUTEROL SULFATE (2.5 MG/3ML) 0.083% IN NEBU
5.0000 mg | INHALATION_SOLUTION | Freq: Once | RESPIRATORY_TRACT | Status: AC
  Administered 2022-03-22: 5 mg via RESPIRATORY_TRACT
  Filled 2022-03-22: qty 6

## 2022-03-22 NOTE — ED Notes (Signed)
Completed treatment. Breathing much better, able to speak in complete sentences. States she feels better.

## 2022-03-22 NOTE — ED Notes (Signed)
Pt ambulatory to bathroom w/o assist 

## 2022-03-22 NOTE — ED Notes (Addendum)
Pt ambulatory in room w/o assist. Pt on 3L while ambulating. O2 sat > 93% while ambulating. No complaints

## 2022-03-22 NOTE — ED Provider Notes (Signed)
[facility] DEPT Provider Note   CSN: [id number] Arrival date & time: 03/22/22  0737     History  Chief Complaint  Patient presents with   Shortness of Breath    [name] is a 61 y.o. female.  Patient is a 61 year old female with a history of COPD with home oxygen, prior substance abuse, CAD and hyperlipidemia who is presenting today due to worsening shortness of breath.  Patient reports for the last few days she has had worsening shortness of breath but really this morning it became severe.  Her inhalers were not working.  She has had cough, wheezing and shortness of breath.  She denies any sputum production, fever.  She did report her doctor had given her steroids and she had been taking 1 a day but yesterday she took 2 because she felt like it was not helping.  She denies any chest pain, abdominal pain, nausea vomiting or diarrhea.  Patient has been following with pulmonology as she did have pneumonia and is having repeat scans most recently in October which shows improving opacity which appeared to be pneumonia.  The history is provided by the patient.  Shortness of Breath      Home Medications Prior to Admission medications   Medication Sig Start Date End Date Taking? Authorizing Provider  predniSONE (DELTASONE) 20 MG tablet Take 2 tablets (40 mg total) by mouth daily. 03/22/22  Yes Kathyrn Warmuth, Loree Fee, MD  albuterol (PROVENTIL) (2.5 MG/3ML) 0.083% nebulizer solution USE 1 VIAL IN NEBULIZER EVERY 6 HOURS - as needed for wheezing or shortness of breath Patient taking differently: Take 2.5 mg by nebulization every 6 (six) hours as needed for wheezing or shortness of breath. 06/02/21   Mannam, Hart Robinsons, MD  albuterol (VENTOLIN HFA) 108 (90 Base) MCG/ACT inhaler INHALE 2 PUFFS BY MOUTH INTO THE LUNGS EVERY 4 HOURS AS NEEDED FOR WHEEZE OR FOR SHORTNESS OF BREATH 03/08/22   Mannam, Praveen, MD  Budeson-Glycopyrrol-Formoterol (BREZTRI AEROSPHERE)  160-9-4.8 MCG/ACT AERO TAKE 2 PUFFS BY MOUTH TWICE A DAY Patient taking differently: Inhale 2 puffs into the lungs 2 (two) times daily. TAKE 2 PUFFS BY MOUTH TWICE A DAY 06/27/21   Antonieta Pert, MD  budesonide-formoterol (SYMBICORT) 160-4.5 MCG/ACT inhaler Inhale 2 puffs into the lungs 2 (two) times daily. 01/27/22   Mannam, Hart Robinsons, MD  famotidine (PEPCID) 20 MG tablet Take 1 tablet (20 mg total) by mouth at bedtime. 01/04/22   Richardo Priest, MD  ibuprofen (ADVIL) 200 MG tablet Take 400 mg by mouth every 6 (six) hours as needed for fever, headache or mild pain.    [provider]  isosorbide mononitrate (IMDUR) 30 MG 24 hr tablet Take 30 mg by mouth daily. Patient not taking: Reported on 01/10/2022 12/31/21   [provider]  montelukast (SINGULAIR) 10 MG tablet TAKE 1 TABLET BY MOUTH EVERYDAY AT BEDTIME 03/08/22   Mannam, Praveen, MD  nitroGLYCERIN (NITROSTAT) 0.4 MG SL tablet Place 1 tablet (0.4 mg total) under the tongue every 5 (five) minutes as needed for chest pain. 06/21/21   Richardo Priest, MD  OXYGEN Inhale 3 L into the lungs at bedtime.     [provider]  REPATHA SURECLICK 106 MG/ML SOAJ INJECT 140 MG INTO THE SKIN EVERY 14 (FOURTEEN) DAYS. 03/07/22   Richardo Priest, MD  Roflumilast (DALIRESP) 250 MCG TABS Start '250mg'$  daily for 2 weeks 01/05/22   Marshell Garfinkel, MD  roflumilast (DALIRESP) 500 MCG TABS tablet Start '500mg'$  daily after 2  weeks of '250mg'$  01/05/22   Mannam, Hart Robinsons, MD  Tiotropium Bromide Monohydrate (SPIRIVA RESPIMAT) 2.5 MCG/ACT AERS Inhale 2 puffs into the lungs daily. 01/27/22   Marshell Garfinkel, MD      Allergies    Amoxicillin, Tylenol [acetaminophen], Statins, and Zetia [ezetimibe]    Review of Systems   Review of Systems  Respiratory:  Positive for shortness of breath.     Physical Exam Updated Vital Signs BP 121/80   Pulse 70   Temp 97.9 F (36.6 C) (Oral)   Resp 18   SpO2 100%  Physical Exam Vitals and nursing note reviewed.   Constitutional:      General: She is in acute distress.     Appearance: She is well-developed.  HENT:     Head: Normocephalic and atraumatic.  Eyes:     Pupils: Pupils are equal, round, and reactive to light.  Cardiovascular:     Rate and Rhythm: Regular rhythm. Tachycardia present.     Heart sounds: Normal heart sounds. No murmur heard.    No friction rub.  Pulmonary:     Effort: Pulmonary effort is normal. Tachypnea present.     Breath sounds: Decreased breath sounds and wheezing present. No rales.  Abdominal:     General: Bowel sounds are normal. There is no distension.     Palpations: Abdomen is soft.     Tenderness: There is no abdominal tenderness. There is no guarding or rebound.  Musculoskeletal:        General: No tenderness. Normal range of motion.     Right lower leg: No edema.     Left lower leg: No edema.     Comments: No edema  Skin:    General: Skin is warm and dry.     Findings: No rash.  Neurological:     Mental Status: She is alert and oriented to person, place, and time.     Cranial Nerves: No cranial nerve deficit.  Psychiatric:        Behavior: Behavior normal.     ED Results / Procedures / Treatments   Labs (all labs ordered are listed, but only abnormal results are displayed) Labs Reviewed  BLOOD GAS, VENOUS - Abnormal; Notable for the following components:      Result Value   pO2, Ven 31 (*)    Bicarbonate 34.7 (*)    Acid-Base Excess 7.4 (*)    All other components within normal limits  CBC WITH DIFFERENTIAL/PLATELET  BASIC METABOLIC PANEL    EKG EKG Interpretation  Date/Time:  Tuesday March 22 2022 10:12:19 EST Ventricular Rate:  85 PR Interval:  138 QRS Duration: 86 QT Interval:  399 QTC Calculation: 475 R Axis:   80 Text Interpretation: Sinus rhythm Consider right atrial enlargement No significant change since last tracing Confirmed by Blanchie Dessert [phone]) on 03/22/2022 10:42:23 AM  Radiology DG Chest 2 View  Result  Date: 03/22/2022 CLINICAL DATA:  New onset severe shortness of breath this morning, history COPD EXAM: CHEST - 2 VIEW COMPARISON:  01/10/2022 FINDINGS: Normal heart size, mediastinal contours, and pulmonary vascularity. Minimal bibasilar atelectasis, improved aeration at RIGHT base versus previous exam. Linear scarring RIGHT upper lobe. No definite acute infiltrate, pleural effusion, or pneumothorax. Osseous structures unremarkable. IMPRESSION: Minimal bibasilar atelectasis, with improved aeration at RIGHT base since previous exam. No acute abnormalities. Electronically Signed   By: Lavonia Dana M.D.   On: 03/22/2022 10:44    Procedures Procedures    Medications Ordered in ED Medications  albuterol (VENTOLIN HFA) 108 (90 Base) MCG/ACT inhaler 2 puff (has no administration in time range)  albuterol (PROVENTIL) (2.5 MG/3ML) 0.083% nebulizer solution 5 mg (5 mg Nebulization Given 03/22/22 0957)  ipratropium (ATROVENT) nebulizer solution 0.5 mg (0.5 mg Nebulization Given 03/22/22 0958)  methylPREDNISolone sodium succinate (SOLU-MEDROL) 125 mg/2 mL injection 125 mg (125 mg Intravenous Given 03/22/22 0957)  magnesium sulfate IVPB 2 g 50 mL (0 g Intravenous Stopped 03/22/22 1106)    ED Course/ Medical Decision Making/ A&P                           Medical Decision Making Amount and/or Complexity of Data Reviewed External Data Reviewed: notes.    Details: pulmonology Labs: ordered. Decision-making details documented in ED Course. Radiology: ordered and independent interpretation performed. Decision-making details documented in ED Course. ECG/medicine tests: ordered and independent interpretation performed. Decision-making details documented in ED Course.  Risk Prescription drug management.   Pt with multiple medical problems and comorbidities and presenting today with a complaint that caries a high risk for morbidity and mortality.  Patient here today with respiratory distress.  Patient has a  history of COPD and improving pneumonia.  She does wear oxygen at home but symptoms have worsened in the last 24 hours.  Patient reports she is still taking prednisone but it did not seem to be helping.  She has a history of pneumonia but based on her last CT in October it appeared to be resolving.  Patient today on exam is tachypneic, mild respiratory distress and initially had oxygen at 74%.  Patient placed on oxygen here.  She is denying infectious symptoms at this time and lower suspicion for new pneumonia, PE or CHF.  Feel that patient needs Solu-Medrol, magnesium, albuterol and Atrovent.  She may need BiPAP if symptoms or not improving.  Currently she is mentating normally. 1:12 PM I independently interpreted patient's EKG and labs.  EKG without acute findings, labs with normal CBC, BMP and VBG.  I have independently visualized and interpreted pt's images today.  X-ray improved from prior.  On repeat evaluation after first treatment, magnesium and steroids patient is markedly improved.  She is now on her home 3 L satting 98 to 100%.  Work of breathing has significantly improved and wheezing has resolved.  Will monitor patient, ensure patient can ambulate but feel that she may be a candidate for discharge home.  1:12 PM Patient remained stable.  She is not having any further wheezing.  She was able to ambulate with good O2 sats and no recurrent wheezing.  Feel that she is a good candidate for discharge at this time.  She was given return precautions.  She was given a 5-day course of prednisone and given a new inhaler but reports she has plenty of nebulizer at home.        Final Clinical Impression(s) / ED Diagnoses Final diagnoses:  COPD exacerbation ([place])    Rx / DC Orders ED Discharge Orders          Ordered    predniSONE (DELTASONE) 20 MG tablet  Daily        03/22/22 1312              Blanchie Dessert, MD 03/22/22 1312

## 2022-03-22 NOTE — ED Triage Notes (Signed)
Pt arrives w/ c/o SHOB. Reports hx COPD. States she has tried home O2 & nebs & inhalers w/ no relief. O2 upon arrival was 74% RA. Came up to 90s on 3L [place]. Wheezing noted.

## 2022-03-23 ENCOUNTER — Ambulatory Visit: Payer: BC Managed Care – PPO | Admitting: Primary Care

## 2022-03-24 ENCOUNTER — Other Ambulatory Visit: Payer: Self-pay | Admitting: Pulmonary Disease

## 2022-03-28 ENCOUNTER — Encounter: Payer: Self-pay | Admitting: Internal Medicine

## 2022-03-28 ENCOUNTER — Ambulatory Visit (INDEPENDENT_AMBULATORY_CARE_PROVIDER_SITE_OTHER): Payer: BC Managed Care – PPO | Admitting: Internal Medicine

## 2022-03-28 VITALS — BP 126/70 | HR 98 | Temp 98.2°F | Ht 63.0 in | Wt 177.0 lb

## 2022-03-28 DIAGNOSIS — J9611 Chronic respiratory failure with hypoxia: Secondary | ICD-10-CM

## 2022-03-28 DIAGNOSIS — J441 Chronic obstructive pulmonary disease with (acute) exacerbation: Secondary | ICD-10-CM | POA: Diagnosis not present

## 2022-03-28 MED ORDER — BREZTRI AEROSPHERE 160-9-4.8 MCG/ACT IN AERO: 2.0000 | INHALATION_SPRAY | Freq: Two times a day (BID) | RESPIRATORY_TRACT | 0 refills | Status: DC

## 2022-03-28 MED ORDER — METHYLPREDNISOLONE ACETATE 80 MG/ML IJ SUSP
120.0000 mg | Freq: Once | INTRAMUSCULAR | Status: AC
  Administered 2022-03-28: 120 mg via INTRAMUSCULAR

## 2022-03-28 MED ORDER — BREZTRI AEROSPHERE 160-9-4.8 MCG/ACT IN AERO: INHALATION_SPRAY | RESPIRATORY_TRACT | 2 refills | Status: DC

## 2022-03-28 MED ORDER — PREDNISONE 10 MG PO TABS: ORAL_TABLET | ORAL | 0 refills | Status: DC
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
## 2022-03-28 NOTE — Patient Instructions (Addendum)
Plan A = Automatic = Always=    Breztri Take 2 puffs first thing in am and then another 2 puffs about 12 hours later.     Plan B = Backup (to supplement plan A, not to replace it) Only use your albuterol inhaler as a rescue medication to be used if you can't catch your breath by resting or doing a relaxed purse lip breathing pattern.  - The less you use it, the better it will work when you need it. - Ok to use the inhaler up to 2 puffs  every 4 hours if you must but call for appointment if use goes up over your usual need - Don't leave home without it !!  (think of it like the spare tire for your car)   Plan C = Crisis (instead of Plan B but only if Plan B stops working) - only use your albuterol nebulizer if you first try Plan B and it fails to help > ok to use the nebulizer up to every 4 hours but if start needing it regularly call for immediate appointment   Prednisone 10 mg take  4 each am x 2 days,   2 each am x 2 days,  1 each am x 2 days and stop \  Depomedrol 120 mg IM as needed   Call your oxygen company for the adapter for your car if you don't have one for your cigarette lighter  Make sure you check your oxygen saturation  AT  your highest level of activity (not after you stop)   to be sure it stays over 90% and adjust  02 flow upward to maintain this level if needed but remember to turn it back to previous settings when you stop (to conserve your supply).    Please schedule a follow up office visit in 2 weeks, sooner if needed  with all medications /inhalers/ solutions in hand so we can verify exactly what you are taking. This includes all medications from all doctors and over the counters

## 2022-03-28 NOTE — Assessment & Plan Note (Signed)
Advised  Make sure you check your oxygen saturation  AT  your highest level of activity (not after you stop)   to be sure it stays over 90% and adjust  02 flow upward to maintain this level if needed but remember to turn it back to previous settings when you stop (to conserve your supply).   Needs adapter for POC for car and return with this at next ov/ advised          Each maintenance medication was reviewed in detail including emphasizing most importantly the difference between maintenance and prns and under what circumstances the prns are to be triggered using an action plan format where appropriate.  Total time for H and P, chart review, counseling, reviewing hfa/neb/02 device(s) and generating customized AVS unique to this office visit / same day charting = > 30 min with pt new to me

## 2022-03-28 NOTE — Assessment & Plan Note (Addendum)
Quit smoking 2011  - 06/02/17 FVC 1.50 [60%], FEV1 0.79 [40%], F/F 53, TLC 96, RV/TLC 167%, DLCO 32% - 03/28/2022  After extensive coaching inhaler device,  effectiveness =    75% from a baseline of < 25%(poor insp):  rec continue breztri plus approp saba and Prednisone 10 mg take  4 each am x 2 days,   2 each am x 2 days,  1 each am x 2 days and stop   DDX of  difficult airways management almost all start with A and  include Adherence, Ace Inhibitors, Acid Reflux, Active Sinus Disease, Alpha 1 Antitripsin deficiency, Anxiety masquerading as Airways dz,  ABPA,  Allergy(esp in young), Aspiration (esp in elderly), Adverse effects of meds,  Active smoking or vaping, A bunch of PE's (a small clot burden can't cause this syndrome unless there is already severe underlying pulm or vascular dz with poor reserve) plus two Bs  = Bronchiectasis and Beta blocker use..and one C= CHF  Adherence is always the initial "prime suspect" and is a multilayered concern that requires a "trust but verify" approach in every patient - starting with knowing how to use medications, especially inhalers, correctly, keeping up with refills and understanding the fundamental difference between maintenance and prns vs those medications only taken for a very short course and then stopped and not refilled.  - see hfa teaching   ? Allergy/asthma compoenent> depomedrol 120 mg IM then Prednisone 10 mg take  4 each am x 2 days,   2 each am x 2 days,  1 each am x 2 days and stop  Re SABA :  I spent extra time with pt today reviewing appropriate use of albuterol for prn use on exertion with the following points: 1) saba is for relief of sob that does not improve by walking a slower pace or resting but rather if the pt does not improve after trying this first. 2) If the pt is convinced, as many are, that saba helps recover from activity faster then it's easy to tell if this is the case by re-challenging : ie stop, take the inhaler, then p 5  minutes try the exact same activity (intensity of workload) that just caused the symptoms and see if they are substantially diminished or not after saba 3) if there is an activity that reproducibly causes the symptoms, try the saba 15 min before the activity on alternate days   If in fact the saba really does help, then fine to continue to use it prn but advised may need to look closer at the maintenance regimen being used to achieve better control of airways disease with exertion.   Return in 2 weeks with all meds in hand using a trust but verify approach to confirm accurate Medication  Reconciliation The principal here is that until we are certain that the  patients are doing what we've asked, it makes no sense to ask them to do more.

## 2022-04-15 ENCOUNTER — Ambulatory Visit (INDEPENDENT_AMBULATORY_CARE_PROVIDER_SITE_OTHER): Payer: BC Managed Care – PPO | Admitting: Student

## 2022-04-15 ENCOUNTER — Encounter: Payer: Self-pay | Admitting: Student

## 2022-04-15 VITALS — BP 116/76 | HR 97 | Temp 98.1°F | Ht 63.0 in | Wt 177.4 lb

## 2022-04-15 DIAGNOSIS — J9611 Chronic respiratory failure with hypoxia: Secondary | ICD-10-CM

## 2022-04-15 DIAGNOSIS — J441 Chronic obstructive pulmonary disease with (acute) exacerbation: Secondary | ICD-10-CM

## 2022-04-15 MED ORDER — TRELEGY ELLIPTA 200-62.5-25 MCG/ACT IN AEPB: 1.0000 | INHALATION_SPRAY | Freq: Every day | RESPIRATORY_TRACT | 0 refills | Status: DC

## 2022-04-15 MED ORDER — AZITHROMYCIN 250 MG PO TABS: ORAL_TABLET | ORAL | 0 refills | Status: DC

## 2022-04-15 MED ORDER — PREDNISONE 10 MG PO TABS: ORAL_TABLET | ORAL | 0 refills | Status: DC

## 2022-04-15 MED ORDER — ROFLUMILAST 250 MCG PO TABS: 1.0000 | ORAL_TABLET | Freq: Every day | ORAL | 0 refills | Status: DC

## 2022-04-15 MED ORDER — ROFLUMILAST 500 MCG PO TABS: 500.0000 ug | ORAL_TABLET | Freq: Every day | ORAL | 11 refills | Status: DC
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
## 2022-04-15 NOTE — Patient Instructions (Addendum)
-   Try trelegy 1 puff once daily rinse mouth after use. Let us know if you prefer it to breztri.  - prednisone taper, azithromycin - prescription placed today for roflumilast start 250 mcg once daily for 4 weeks. Then increase to 500 mcg daily.  - See you in 8 weeks or sooner if need be!

## 2022-04-24 ENCOUNTER — Inpatient Hospital Stay (HOSPITAL_COMMUNITY)
Admission: EM | Admit: 2022-04-24 | Discharge: 2022-04-25 | DRG: 193 | Disposition: A | Discharge status: Home / Self Care | Payer: BC Managed Care – PPO | Attending: Internal Medicine | Admitting: Internal Medicine

## 2022-04-24 ENCOUNTER — Emergency Department (HOSPITAL_COMMUNITY): Payer: BC Managed Care – PPO

## 2022-04-24 ENCOUNTER — Other Ambulatory Visit: Payer: Self-pay

## 2022-04-24 ENCOUNTER — Telehealth: Payer: Self-pay | Admitting: Pulmonary Disease

## 2022-04-24 DIAGNOSIS — Z79899 Other long term (current) drug therapy: Secondary | ICD-10-CM

## 2022-04-24 DIAGNOSIS — Z880 Allergy status to penicillin: Secondary | ICD-10-CM

## 2022-04-24 DIAGNOSIS — I2510 Atherosclerotic heart disease of native coronary artery without angina pectoris: Secondary | ICD-10-CM | POA: Diagnosis present

## 2022-04-24 DIAGNOSIS — J9601 Acute respiratory failure with hypoxia: Secondary | ICD-10-CM | POA: Diagnosis present

## 2022-04-24 DIAGNOSIS — J101 Influenza due to other identified influenza virus with other respiratory manifestations: Principal | ICD-10-CM

## 2022-04-24 DIAGNOSIS — J9622 Acute and chronic respiratory failure with hypercapnia: Secondary | ICD-10-CM | POA: Diagnosis present

## 2022-04-24 DIAGNOSIS — I10 Essential (primary) hypertension: Secondary | ICD-10-CM | POA: Diagnosis present

## 2022-04-24 DIAGNOSIS — Z9981 Dependence on supplemental oxygen: Secondary | ICD-10-CM | POA: Diagnosis not present

## 2022-04-24 DIAGNOSIS — J9621 Acute and chronic respiratory failure with hypoxia: Secondary | ICD-10-CM | POA: Diagnosis present

## 2022-04-24 DIAGNOSIS — Z825 Family history of asthma and other chronic lower respiratory diseases: Secondary | ICD-10-CM

## 2022-04-24 DIAGNOSIS — Z823 Family history of stroke: Secondary | ICD-10-CM

## 2022-04-24 DIAGNOSIS — R000 Tachycardia, unspecified: Secondary | ICD-10-CM | POA: Diagnosis present

## 2022-04-24 DIAGNOSIS — Z1152 Encounter for screening for COVID-19: Secondary | ICD-10-CM

## 2022-04-24 DIAGNOSIS — Z888 Allergy status to other drugs, medicaments and biological substances status: Secondary | ICD-10-CM | POA: Diagnosis not present

## 2022-04-24 DIAGNOSIS — J9602 Acute respiratory failure with hypercapnia: Secondary | ICD-10-CM

## 2022-04-24 DIAGNOSIS — R7303 Prediabetes: Secondary | ICD-10-CM | POA: Diagnosis present

## 2022-04-24 DIAGNOSIS — K219 Gastro-esophageal reflux disease without esophagitis: Secondary | ICD-10-CM | POA: Diagnosis present

## 2022-04-24 DIAGNOSIS — J449 Chronic obstructive pulmonary disease, unspecified: Secondary | ICD-10-CM | POA: Diagnosis present

## 2022-04-24 DIAGNOSIS — Z8249 Family history of ischemic heart disease and other diseases of the circulatory system: Secondary | ICD-10-CM

## 2022-04-24 DIAGNOSIS — J441 Chronic obstructive pulmonary disease with (acute) exacerbation: Secondary | ICD-10-CM | POA: Diagnosis present

## 2022-04-24 DIAGNOSIS — E876 Hypokalemia: Secondary | ICD-10-CM | POA: Diagnosis present

## 2022-04-24 DIAGNOSIS — F419 Anxiety disorder, unspecified: Secondary | ICD-10-CM | POA: Diagnosis not present

## 2022-04-24 DIAGNOSIS — Z87891 Personal history of nicotine dependence: Secondary | ICD-10-CM

## 2022-04-24 DIAGNOSIS — E785 Hyperlipidemia, unspecified: Secondary | ICD-10-CM | POA: Diagnosis present

## 2022-04-24 DIAGNOSIS — R079 Chest pain, unspecified: Secondary | ICD-10-CM

## 2022-04-24 HISTORY — DX: Influenza due to other identified influenza virus with other respiratory manifestations: J10.1

## 2022-04-24 LAB — CBC WITH DIFFERENTIAL/PLATELET
Abs Immature Granulocytes: 0.04 10*3/uL (ref 0.00–0.07)
Basophils Absolute: 0 10*3/uL (ref 0.0–0.1)
Basophils Relative: 0 %
Eosinophils Absolute: 0 10*3/uL (ref 0.0–0.5)
Eosinophils Relative: 0 %
HCT: 39.5 % (ref 36.0–46.0)
Hemoglobin: 12.3 g/dL (ref 12.0–15.0)
Immature Granulocytes: 1 %
Lymphocytes Relative: 8 %
Lymphs Abs: 0.7 10*3/uL (ref 0.7–4.0)
MCH: 29.1 pg (ref 26.0–34.0)
MCHC: 31.1 g/dL (ref 30.0–36.0)
MCV: 93.6 fL (ref 80.0–100.0)
Monocytes Absolute: 0.5 10*3/uL (ref 0.1–1.0)
Monocytes Relative: 6 %
Neutro Abs: 7.4 10*3/uL (ref 1.7–7.7)
Neutrophils Relative %: 85 %
Platelets: 260 10*3/uL (ref 150–400)
RBC: 4.22 MIL/uL (ref 3.87–5.11)
RDW: 14.9 % (ref 11.5–15.5)
WBC: 8.7 10*3/uL (ref 4.0–10.5)
nRBC: 0 % (ref 0.0–0.2)

## 2022-04-24 LAB — BLOOD GAS, VENOUS
Acid-Base Excess: 3.2 mmol/L — ABNORMAL HIGH (ref 0.0–2.0)
Bicarbonate: 30.6 mmol/L — ABNORMAL HIGH (ref 20.0–28.0)
O2 Saturation: 94.3 %
Patient temperature: 37.1
pCO2, Ven: 58 mmHg (ref 44–60)
pH, Ven: 7.33 (ref 7.25–7.43)
pO2, Ven: 70 mmHg — ABNORMAL HIGH (ref 32–45)

## 2022-04-24 LAB — BASIC METABOLIC PANEL
Anion gap: 9 (ref 5–15)
BUN: 8 mg/dL (ref 8–23)
CO2: 28 mmol/L (ref 22–32)
Calcium: 8.8 mg/dL — ABNORMAL LOW (ref 8.9–10.3)
Chloride: 103 mmol/L (ref 98–111)
Creatinine, Ser: 0.81 mg/dL (ref 0.44–1.00)
GFR, Estimated: >60 mL/min (ref >60)
Glucose, Bld: 135 mg/dL — ABNORMAL HIGH (ref 70–99)
Potassium: 3.2 mmol/L — ABNORMAL LOW (ref 3.5–5.1)
Sodium: 140 mmol/L (ref 135–145)

## 2022-04-24 LAB — RESP PANEL BY RT-PCR (FLU A&B, COVID) ARPGX2
Influenza A by PCR: POSITIVE — AB
Influenza B by PCR: NEGATIVE
SARS Coronavirus 2 by RT PCR: NEGATIVE

## 2022-04-24 LAB — TROPONIN I (HIGH SENSITIVITY)
Troponin I (High Sensitivity): 36 ng/L — ABNORMAL HIGH (ref <18)
Troponin I (High Sensitivity): 8 ng/L (ref <18)

## 2022-04-24 LAB — BRAIN NATRIURETIC PEPTIDE: B Natriuretic Peptide: 46.3 pg/mL (ref 0.0–100.0)

## 2022-04-24 LAB — MRSA NEXT GEN BY PCR, NASAL: MRSA by PCR Next Gen: NOT DETECTED

## 2022-04-24 LAB — MAGNESIUM: Magnesium: 2.7 mg/dL — ABNORMAL HIGH (ref 1.7–2.4)

## 2022-04-24 MED ORDER — ALBUTEROL SULFATE (2.5 MG/3ML) 0.083% IN NEBU: 2.5000 mg | INHALATION_SOLUTION | Freq: Four times a day (QID) | RESPIRATORY_TRACT | Status: DC

## 2022-04-24 MED ORDER — ALBUTEROL SULFATE (2.5 MG/3ML) 0.083% IN NEBU
10.0000 mg/h | INHALATION_SOLUTION | Freq: Once | RESPIRATORY_TRACT | Status: AC
  Administered 2022-04-24: 10 mg/h via RESPIRATORY_TRACT

## 2022-04-24 MED ORDER — OSELTAMIVIR PHOSPHATE 75 MG PO CAPS
75.0000 mg | ORAL_CAPSULE | Freq: Two times a day (BID) | ORAL | Status: DC
  Administered 2022-04-24 – 2022-04-25 (×3): 75 mg via ORAL
  Filled 2022-04-24 (×3): qty 1

## 2022-04-24 MED ORDER — IPRATROPIUM-ALBUTEROL 0.5-2.5 (3) MG/3ML IN SOLN
3.0000 mL | Freq: Four times a day (QID) | RESPIRATORY_TRACT | Status: DC
  Administered 2022-04-24: 3 mL via RESPIRATORY_TRACT
  Filled 2022-04-24: qty 3

## 2022-04-24 MED ORDER — METHYLPREDNISOLONE SODIUM SUCC 125 MG IJ SOLR
125.0000 mg | Freq: Once | INTRAMUSCULAR | Status: AC
  Administered 2022-04-24: 125 mg via INTRAVENOUS
  Filled 2022-04-24: qty 2

## 2022-04-24 MED ORDER — MORPHINE SULFATE (PF) 2 MG/ML IV SOLN
2.0000 mg | INTRAVENOUS | Status: DC | PRN
  Administered 2022-04-24 – 2022-04-25 (×3): 2 mg via INTRAVENOUS
  Filled 2022-04-24 (×3): qty 1

## 2022-04-24 MED ORDER — PANTOPRAZOLE SODIUM 40 MG IV SOLR
40.0000 mg | INTRAVENOUS | Status: DC
  Administered 2022-04-24 – 2022-04-25 (×2): 40 mg via INTRAVENOUS
  Filled 2022-04-24 (×2): qty 10

## 2022-04-24 MED ORDER — MAGNESIUM SULFATE 2 GM/50ML IV SOLN
2.0000 g | Freq: Once | INTRAVENOUS | Status: AC
  Administered 2022-04-24: 2 g via INTRAVENOUS
  Filled 2022-04-24: qty 50

## 2022-04-24 MED ORDER — ALBUTEROL SULFATE (2.5 MG/3ML) 0.083% IN NEBU: 2.5000 mg | INHALATION_SOLUTION | RESPIRATORY_TRACT | Status: DC | PRN

## 2022-04-24 MED ORDER — ENOXAPARIN SODIUM 40 MG/0.4ML IJ SOSY
40.0000 mg | PREFILLED_SYRINGE | INTRAMUSCULAR | Status: DC
  Administered 2022-04-24 – 2022-04-25 (×2): 40 mg via SUBCUTANEOUS
  Filled 2022-04-24 (×2): qty 0.4

## 2022-04-24 MED ORDER — IPRATROPIUM-ALBUTEROL 0.5-2.5 (3) MG/3ML IN SOLN: 3.0000 mL | Freq: Four times a day (QID) | RESPIRATORY_TRACT | Status: DC | PRN

## 2022-04-24 MED ORDER — IPRATROPIUM-ALBUTEROL 0.5-2.5 (3) MG/3ML IN SOLN
3.0000 mL | RESPIRATORY_TRACT | Status: DC
  Administered 2022-04-24 – 2022-04-25 (×6): 3 mL via RESPIRATORY_TRACT
  Filled 2022-04-24 (×6): qty 3

## 2022-04-24 MED ORDER — IPRATROPIUM-ALBUTEROL 0.5-2.5 (3) MG/3ML IN SOLN
RESPIRATORY_TRACT | Status: AC
  Filled 2022-04-24: qty 3

## 2022-04-24 MED ORDER — POTASSIUM CHLORIDE 10 MEQ/100ML IV SOLN
10.0000 meq | INTRAVENOUS | Status: AC
  Administered 2022-04-24 (×4): 10 meq via INTRAVENOUS
  Filled 2022-04-24 (×4): qty 100

## 2022-04-24 MED ORDER — METHYLPREDNISOLONE SODIUM SUCC 125 MG IJ SOLR
60.0000 mg | Freq: Two times a day (BID) | INTRAMUSCULAR | Status: DC
  Administered 2022-04-24 – 2022-04-25 (×3): 60 mg via INTRAVENOUS
  Filled 2022-04-24 (×3): qty 2

## 2022-04-24 MED ORDER — ALPRAZOLAM 0.25 MG PO TABS
0.2500 mg | ORAL_TABLET | Freq: Once | ORAL | Status: AC | PRN
  Administered 2022-04-24: 0.25 mg via ORAL
  Filled 2022-04-24: qty 1

## 2022-04-24 MED ORDER — FENTANYL CITRATE PF 50 MCG/ML IJ SOSY
50.0000 ug | PREFILLED_SYRINGE | Freq: Once | INTRAMUSCULAR | Status: AC
  Administered 2022-04-24: 50 ug via INTRAVENOUS
  Filled 2022-04-24: qty 1

## 2022-04-24 MED ORDER — ALBUTEROL SULFATE (2.5 MG/3ML) 0.083% IN NEBU
INHALATION_SOLUTION | RESPIRATORY_TRACT | Status: AC
  Filled 2022-04-24: qty 12

## 2022-04-24 MED ORDER — IPRATROPIUM-ALBUTEROL 0.5-2.5 (3) MG/3ML IN SOLN
3.0000 mL | Freq: Once | RESPIRATORY_TRACT | Status: AC
  Administered 2022-04-24: 3 mL via RESPIRATORY_TRACT
  Filled 2022-04-24: qty 3

## 2022-04-24 MED ORDER — ORAL CARE MOUTH RINSE
15.0000 mL | OROMUCOSAL | Status: DC
  Administered 2022-04-24 – 2022-04-25 (×2): 15 mL via OROMUCOSAL

## 2022-04-24 MED ORDER — METHOCARBAMOL 500 MG PO TABS
500.0000 mg | ORAL_TABLET | Freq: Three times a day (TID) | ORAL | Status: DC | PRN
  Administered 2022-04-24: 500 mg via ORAL
  Filled 2022-04-24: qty 1

## 2022-04-24 MED ORDER — ORAL CARE MOUTH RINSE: 15.0000 mL | OROMUCOSAL | Status: DC | PRN

## 2022-04-24 MED ORDER — CHLORHEXIDINE GLUCONATE CLOTH 2 % EX PADS
6.0000 | MEDICATED_PAD | Freq: Every day | CUTANEOUS | Status: DC
  Administered 2022-04-24: 6 via TOPICAL

## 2022-04-24 NOTE — Progress Notes (Signed)
Progress Note    [name]   KDT:[id number]  DOB: [birth year]  DOA: 04/24/2022     0 PCP: Benito Mccreedy, MD  Initial CC: Shortness of breath, cough  Hospital Course: Ms. Muchmore is a 61 yo female with PMH COPD (follows with pulmonology), chronic hypoxic respiratory failure on 5 L at home, GERD, HLD, CAD who presented with worsening shortness of breath. She was recently seen outpatient by pulmonology on 04/15/2022 and started on prednisone taper and short course of azithromycin. She has also been started on trial of Trelegy Ellipta instead of Breztri. She remains compliant and on Daliresp too.  Next steps if she were to continue to have exacerbations was to consider starting her on Dupixent and chronic azithromycin.  She underwent workup on admission.  CXR was negative for acute abnormalities. Respiratory virus testing was positive for influenza A.  She was then started on Tamiflu. She required BiPAP on admission as well due to hypercarbic respiratory failure and increased work of breathing.  Interval History:  Flu swab returned positive this morning.  Tamiflu started. She was noted to be significantly dyspneic when seen while wearing BiPAP with accessory muscle use.  She was given a dose of morphine after this with significant improvement in her work of breathing. We will continue BiPAP and wean off as able.  Assessment and Plan: * COPD with acute exacerbation ([place]) - presumed due to Flu A - continue breathing treatments, steroids, and BiPAP as needed - Will resume home meds after med rec complete -Discussed with pulmonology, plan is for outpatient follow-up in case of need for still changing her regimen as noted in prior office visit  Acute respiratory failure with hypoxia and hypercapnia ([place]) - on 5L chronic O2 at home - VBG on admission: 7.33/70 - continue bipap for now and wean as able; patient using too much accessory muscle use this am - morphine PRN for WOB/air  hunger  Influenza A - Continue Tamiflu  Hypokalemia - Replete as needed  Essential hypertension - Currently normotensive.  Will start treatment if needed  Hyperlipidemia - On Repatha at home  Chest pain-resolved as of 04/24/2022 - Suspected due to increased work of breathing and coughing - Troponin has down trended.  Chest pain considered noncardiac in etiology -EKG reviewed and nonischemic.  T wave inverted in V2 is present on prior EKG as well   Old records reviewed in assessment of this patient  Antimicrobials: Tamiflu 04/24/2022 >> current  DVT prophylaxis:  enoxaparin (LOVENOX) injection 40 mg Start: 04/24/22 1000   Code Status:   Code Status: Full Code  Mobility Assessment (last 72 hours)     Mobility Assessment   No documentation.           Barriers to discharge:  Disposition Plan: Home 2 to 3 days Status is: Observation  Objective: Blood pressure 138/88, pulse 100, temperature (!) 97.1 F (36.2 C), temperature source Axillary, resp. rate (!) 23, SpO2 97 %.  Examination:  Physical Exam Constitutional:      Appearance: She is ill-appearing.     Comments: Cooperative adult woman laying in bed noted to be uncomfortable appearing and mild to moderate distress from shortness of breath  HENT:     Head: Normocephalic and atraumatic.     Mouth/Throat:     Mouth: Mucous membranes are moist.  Eyes:     Extraocular Movements: Extraocular movements intact.  Cardiovascular:     Rate and Rhythm: Normal rate and regular rhythm.  Pulmonary:  Comments: Poor air movement with coarse breath sounds throughout.  No obvious or significant wheezing however Abdominal:     General: Bowel sounds are normal.     Palpations: Abdomen is soft.  Musculoskeletal:        General: Normal range of motion.     Cervical back: Normal range of motion and neck supple.  Skin:    General: Skin is warm and dry.  Neurological:     General: No focal deficit present.   Psychiatric:        Mood and Affect: Mood is anxious.      Consultants:    Procedures:    Data Reviewed: Results for orders placed or performed during the hospital encounter of 04/24/22 (from the past 24 hour(s))  Basic metabolic panel     Status: Abnormal   Collection Time: 04/24/22 12:55 AM  Result Value Ref Range   Sodium 140 135 - 145 mmol/L   Potassium 3.2 (L) 3.5 - 5.1 mmol/L   Chloride 103 98 - 111 mmol/L   CO2 28 22 - 32 mmol/L   Glucose, Bld 135 (H) 70 - 99 mg/dL   BUN 8 8 - 23 mg/dL   Creatinine, Ser 0.81 0.44 - 1.00 mg/dL   Calcium 8.8 (L) 8.9 - 10.3 mg/dL   GFR, Estimated >60 >60 mL/min   Anion gap 9 5 - 15  CBC with Differential/Platelet     Status: None   Collection Time: 04/24/22 12:55 AM  Result Value Ref Range   WBC 8.7 4.0 - 10.5 K/uL   RBC 4.22 3.87 - 5.11 MIL/uL   Hemoglobin 12.3 12.0 - 15.0 g/dL   HCT 39.5 36.0 - 46.0 %   MCV 93.6 80.0 - 100.0 fL   MCH 29.1 26.0 - 34.0 pg   MCHC 31.1 30.0 - 36.0 g/dL   RDW 14.9 11.5 - 15.5 %   Platelets 260 150 - 400 K/uL   nRBC 0.0 0.0 - 0.2 %   Neutrophils Relative % 85 %   Neutro Abs 7.4 1.7 - 7.7 K/uL   Lymphocytes Relative 8 %   Lymphs Abs 0.7 0.7 - 4.0 K/uL   Monocytes Relative 6 %   Monocytes Absolute 0.5 0.1 - 1.0 K/uL   Eosinophils Relative 0 %   Eosinophils Absolute 0.0 0.0 - 0.5 K/uL   Basophils Relative 0 %   Basophils Absolute 0.0 0.0 - 0.1 K/uL   Immature Granulocytes 1 %   Abs Immature Granulocytes 0.04 0.00 - 0.07 K/uL  Brain natriuretic peptide     Status: None   Collection Time: 04/24/22 12:55 AM  Result Value Ref Range   B Natriuretic Peptide 46.3 0.0 - 100.0 pg/mL  Troponin I (High Sensitivity)     Status: Abnormal   Collection Time: 04/24/22 12:55 AM  Result Value Ref Range   Troponin I (High Sensitivity) 36 (H) <18 ng/L  Blood gas, venous     Status: Abnormal   Collection Time: 04/24/22  2:46 AM  Result Value Ref Range   pH, Ven 7.33 7.25 - 7.43   pCO2, Ven 58 44 - 60 mmHg    pO2, Ven 70 (H) 32 - 45 mmHg   Bicarbonate 30.6 (H) 20.0 - 28.0 mmol/L   Acid-Base Excess 3.2 (H) 0.0 - 2.0 mmol/L   O2 Saturation 94.3 %   Patient temperature 37.1   Troponin I (High Sensitivity)     Status: None   Collection Time: 04/24/22  2:46 AM  Result Value  Ref Range   Troponin I (High Sensitivity) 8 <18 ng/L  Magnesium     Status: Abnormal   Collection Time: 04/24/22  2:46 AM  Result Value Ref Range   Magnesium 2.7 (H) 1.7 - 2.4 mg/dL  Resp Panel by RT-PCR (Flu A&B, Covid) Anterior Nasal Swab     Status: Abnormal   Collection Time: 04/24/22  7:01 AM   Specimen: Anterior Nasal Swab  Result Value Ref Range   SARS Coronavirus 2 by RT PCR NEGATIVE NEGATIVE   Influenza A by PCR POSITIVE (A) NEGATIVE   Influenza B by PCR NEGATIVE NEGATIVE  MRSA Next Gen by PCR, Nasal     Status: None   Collection Time: 04/24/22  7:03 AM   Specimen: Nasal Mucosa; Nasal Swab  Result Value Ref Range   MRSA by PCR Next Gen NOT DETECTED NOT DETECTED    I have Reviewed nursing notes, Vitals, and Lab results since pt's last encounter. Pertinent lab results : see above I have ordered test including BMP, CBC, Mg I have reviewed the last note from staff over past 24 hours I have discussed pt's care plan and test results with nursing staff, case manager  Time spent: Greater than 50% of the 55 minute visit was spent in counseling/coordination of care for the patient as laid out in the A&P.    LOS: 0 days   Dwyane Dee, MD [facility] 04/24/2022, 11:25 AM

## 2022-04-24 NOTE — Assessment & Plan Note (Signed)
-   Continue Tamiflu

## 2022-04-24 NOTE — Hospital Course (Signed)
[name] is a 61 yo female with PMH COPD (follows with pulmonology), chronic hypoxic respiratory failure on 5 L at home, GERD, HLD, CAD who presented with worsening shortness of breath. She was recently seen outpatient by pulmonology on 04/15/2022 and started on prednisone taper and short course of azithromycin. She has also been started on trial of Trelegy Ellipta instead of Breztri. She remains compliant and on Daliresp too.  Next steps if she were to continue to have exacerbations was to consider starting her on Dupixent and chronic azithromycin.  She underwent workup on admission.  CXR was negative for acute abnormalities. Respiratory virus testing was positive for influenza A.  She was then started on Tamiflu. She required BiPAP on admission as well due to hypercarbic respiratory failure and increased work of breathing. She responded well to breathing treatments and steroids.  She was weaned off of BiPAP back to home settings of oxygen. She was continued on Tamiflu and prednisone at discharge.

## 2022-04-24 NOTE — Assessment & Plan Note (Signed)
-   on 5L chronic O2 at home - VBG on admission: 7.33/70 - continue bipap for now and wean as able; patient using too much accessory muscle use this am - morphine PRN for WOB/air hunger

## 2022-04-24 NOTE — ED Notes (Signed)
Called report to D'Hanis, RN in ICU

## 2022-04-24 NOTE — ED Triage Notes (Signed)
Patient coming to ED for evaluation of respiration distress.  Reports symptoms started earlier in the day.  Has taken all Rx and OTC medications without relief.  Oxygen saturations 65% on RA on arrival.  Able to speak one word in between breathes.

## 2022-04-24 NOTE — Assessment & Plan Note (Addendum)
-   presumed due to Flu A -Treated with breathing treatments, steroids, BiPAP - Continued on Tamiflu and steroids at discharge - Outpatient follow-up with pulmonology

## 2022-04-24 NOTE — Progress Notes (Signed)
       Overnight   NAME: [name] MRN: [id number] DOB : [birth year]    Date of Service   04/24/2022   HPI/Events of Note   Notified by RN for increased anxiety while on BiPAP. Otherwise compliant with use regulated by Pt,RT and RN   Interventions/ Plan   Low dose anti-anxiety medication trial  Continue BiPAP as previously ordered.        Gershon Cull BSN MSNA MSN ACNPC-AG Acute Care Nurse Practitioner [place]

## 2022-04-24 NOTE — Progress Notes (Signed)
Provided scheduled nebulizer treatment while PT was off BiPAP (RN had removed for medication administration just minutes before neb) while on HFNC system. After neb completed, PT stated she would like to take a break from BiPAP. RT left room while PT on phone and encouraged to contact staff with call button for breathing concerns. RN aware PT remains off BiPAP. This RT placed a cushion bandage across bridge of nose (slighty red and tender to touch). RN aware.
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
## 2022-04-24 NOTE — Assessment & Plan Note (Addendum)
Repleted. °

## 2022-04-24 NOTE — ED Provider Notes (Signed)
[facility] DEPT Provider Note   CSN: [id number] Arrival date & time: 04/24/22  0047     History  Chief Complaint  Patient presents with  . Shortness of Breath    [name] is a 61 y.o. female who presents to the emergency department with concern for respiratory distress.  States that she has been having progressive worsening shortness of breath since this afternoon around 2 PM.  Oxygen saturation 65% on room air at time of arrival to the ED in respiratory distress.  States she took 60 mg of prednisone today without improvement.  Level 5 caveat due to acuity presentation upon arrival. I personally reviewed her medical records previous history of COPD, recent admission To the hospital in August of this year for respiratory distress secondary to COPD and community-acquired pneumonia.  Patient denies history of heart failure.  Follows with Dr. Kimber Relic, pulmonologist.  Denies relief from rescue inhalers at home today.  At baseline patient is on 3 to 5 L of oxygen by nasal cannula.  In addition to the above listed history patient has history of GERD, CAD, hypertension, prediabetes.  She is not anticoagulated.  HPI     Home Medications Prior to Admission medications   Medication Sig Start Date End Date Taking? Authorizing Provider  albuterol (PROVENTIL) (2.5 MG/3ML) 0.083% nebulizer solution USE 1 VIAL IN NEBULIZER EVERY 6 HOURS - as needed for wheezing or shortness of breath Patient taking differently: Take 2.5 mg by nebulization every 6 (six) hours as needed for wheezing or shortness of breath. 06/02/21   Mannam, Hart Robinsons, MD  albuterol (VENTOLIN HFA) 108 (90 Base) MCG/ACT inhaler INHALE 2 PUFFS BY MOUTH INTO THE LUNGS EVERY 4 HOURS AS NEEDED FOR WHEEZE OR FOR SHORTNESS OF BREATH 03/08/22   Mannam, Hart Robinsons, MD  azithromycin (ZITHROMAX) 250 MG tablet Two tablets on day 1, then 1 tablet daily till gone 04/15/22   Maryjane Hurter, MD   Budeson-Glycopyrrol-Formoterol (BREZTRI AEROSPHERE) 160-9-4.8 MCG/ACT AERO TAKE 2 PUFFS BY MOUTH TWICE A DAY 03/28/22   Tanda Rockers, MD  famotidine (PEPCID) 20 MG tablet Take 1 tablet (20 mg total) by mouth at bedtime. 01/04/22   Richardo Priest, MD  Fluticasone-Umeclidin-Vilant (TRELEGY ELLIPTA) 200-62.5-25 MCG/ACT AEPB Inhale 1 puff into the lungs daily. 04/15/22   Maryjane Hurter, MD  ibuprofen (ADVIL) 200 MG tablet Take 400 mg by mouth every 6 (six) hours as needed for fever, headache or mild pain.    [provider]  montelukast (SINGULAIR) 10 MG tablet TAKE 1 TABLET BY MOUTH EVERYDAY AT BEDTIME 03/08/22   Mannam, Praveen, MD  nitroGLYCERIN (NITROSTAT) 0.4 MG SL tablet Place 1 tablet (0.4 mg total) under the tongue every 5 (five) minutes as needed for chest pain. 06/21/21   Richardo Priest, MD  OXYGEN Inhale 3 L into the lungs at bedtime.     [provider]  predniSONE (DELTASONE) 10 MG tablet Take 4 tabs by mouth for 3 days, then 3 for 3 days, 2 for 3 days, 1 for 3 days and stop 04/15/22   Maryjane Hurter, MD  REPATHA SURECLICK 237 MG/ML SOAJ INJECT 140 MG INTO THE SKIN EVERY 14 (FOURTEEN) DAYS. 03/07/22   Richardo Priest, MD  Roflumilast (DALIRESP) 250 MCG TABS Take 1 tablet by mouth daily. 04/15/22   Maryjane Hurter, MD  roflumilast (DALIRESP) 500 MCG TABS tablet Take 1 tablet (500 mcg total) by mouth daily. 05/13/22   Maryjane Hurter, MD  Allergies    Amoxicillin, Tylenol [acetaminophen], Statins, and Zetia [ezetimibe]    Review of Systems   Review of Systems  Unable to perform ROS: Acuity of condition    Physical Exam Updated Vital Signs BP 111/73   Pulse 84   Resp 17   SpO2 100%  Physical Exam Vitals and nursing note reviewed.  Constitutional:      General: She is in acute distress (respiratory).  HENT:     Head: Normocephalic and atraumatic.     Mouth/Throat:     Mouth: Mucous membranes are moist.     Pharynx: No oropharyngeal exudate  or posterior oropharyngeal erythema.  Eyes:     General: Lids are normal. Vision grossly intact.        Right eye: No discharge.        Left eye: No discharge.     Conjunctiva/sclera: Conjunctivae normal.     Pupils: Pupils are equal, round, and reactive to light.  Cardiovascular:     Rate and Rhythm: Regular rhythm. Tachycardia present.     Pulses: Normal pulses.     Heart sounds: Normal heart sounds. No murmur heard. Pulmonary:     Effort: Tachypnea, accessory muscle usage, prolonged expiration, respiratory distress and retractions present.     Breath sounds: Decreased air movement present. Examination of the right-upper field reveals wheezing. Examination of the left-upper field reveals wheezing. Examination of the right-middle field reveals wheezing. Examination of the left-middle field reveals wheezing. Examination of the right-lower field reveals decreased breath sounds and wheezing. Examination of the left-lower field reveals decreased breath sounds and wheezing. Decreased breath sounds and wheezing present. No rales.     Comments: O2 sat in the 60%s on RA, requiring BiPAP at time of arrival to the ED.  Chest:     Chest wall: No mass, lacerations, deformity, swelling, tenderness, crepitus or edema.  Abdominal:     General: Bowel sounds are normal. There is no distension.     Palpations: Abdomen is soft.     Tenderness: There is no abdominal tenderness. There is no right CVA tenderness, left CVA tenderness, guarding or rebound.  Musculoskeletal:        General: No deformity.     Cervical back: Normal range of motion and neck supple.     Right lower leg: 1+ Edema present.     Left lower leg: 1+ Edema present.  Skin:    General: Skin is warm and dry.  Neurological:     Mental Status: She is alert. Mental status is at baseline.  Psychiatric:        Mood and Affect: Mood normal.     ED Results / Procedures / Treatments   Labs (all labs ordered are listed, but only abnormal  results are displayed) Labs Reviewed  BASIC METABOLIC PANEL - Abnormal; Notable for the following components:      Result Value   Potassium 3.2 (*)    Glucose, Bld 135 (*)    Calcium 8.8 (*)    All other components within normal limits  BLOOD GAS, VENOUS - Abnormal; Notable for the following components:   pO2, Ven 70 (*)    Bicarbonate 30.6 (*)    Acid-Base Excess 3.2 (*)    All other components within normal limits  TROPONIN I (HIGH SENSITIVITY) - Abnormal; Notable for the following components:   Troponin I (High Sensitivity) 36 (*)    All other components within normal limits  RESP PANEL BY RT-PCR (FLU A&B, COVID)  ARPGX2  CBC WITH DIFFERENTIAL/PLATELET  BRAIN NATRIURETIC PEPTIDE  TROPONIN I (HIGH SENSITIVITY)    EKG None  Radiology DG Chest Port 1 View  Result Date: 04/24/2022 CLINICAL DATA:  Respiratory distress EXAM: PORTABLE CHEST 1 VIEW COMPARISON:  03/22/2022 FINDINGS: Cardiac shadow is prominent accentuated by the portable technique. The lungs are well aerated bilaterally. No focal infiltrate or sizable effusion is seen. No bony abnormality is noted. IMPRESSION: No active disease. Electronically Signed   By: Inez Catalina M.D.   On: 04/24/2022 01:11    Procedures .Critical Care  Performed by: Emeline Darling, PA-C Authorized by: Emeline Darling, PA-C   Critical care provider statement:    Critical care time (minutes):  45   Critical care was time spent personally by me on the following activities:  Development of treatment plan with patient or surrogate, discussions with consultants, evaluation of patient's response to treatment, examination of patient, obtaining history from patient or surrogate, ordering and performing treatments and interventions, ordering and review of laboratory studies, ordering and review of radiographic studies, pulse oximetry and re-evaluation of patient's condition     Medications Ordered in ED Medications  albuterol  (PROVENTIL) (2.5 MG/3ML) 0.083% nebulizer solution (  Not Given 04/24/22 0148)  ipratropium-albuterol (DUONEB) 0.5-2.5 (3) MG/3ML nebulizer solution (  Given 04/24/22 0147)  methylPREDNISolone sodium succinate (SOLU-MEDROL) 125 mg/2 mL injection 125 mg (125 mg Intravenous Given 04/24/22 0105)  albuterol (PROVENTIL) (2.5 MG/3ML) 0.083% nebulizer solution (10 mg/hr Nebulization Given 04/24/22 0104)  magnesium sulfate IVPB 2 g 50 mL (0 g Intravenous Stopped 04/24/22 0213)  ipratropium-albuterol (DUONEB) 0.5-2.5 (3) MG/3ML nebulizer solution 3 mL (3 mLs Nebulization Given 04/24/22 0324)    ED Course/ Medical Decision Making/ A&P Clinical Course as of 04/24/22 0530  Sun Apr 24, 2022  0212 Lab lost VBG tube, will need to recollect.  [RS]  [phone] Consult to hospitalist Dr. Marlowe Sax who is agreeable to admit patient to her service.  Appreciate collaboration in care of the patient. [RS]  0530 Patient reevaluated, continues to endorse chest soreness with coughing, work of breathing significantly improved on BiPAP, patient's GCS remains 15. [RS]    Clinical Course User Index [RS] Jerilee Space, Gypsy Balsam, PA-C                           Medical Decision Making 61 year old female who presents in respiratory distress.   O2 sat 65% on RA at arrival, tachycardic to the 120s, hypertensive. Cardiac exam with tachycardia with regular rhythm, pulmonary exam as above with significantly increased WOB, decreased air movement throughout. Respiratory distress. Placed on BiPAP immediately due to WOB, hypoxia persisting on NRB. Duoneb followed by CAT initiated through BiPAP.   Solumedrol and mag administered.   DDX includes but is not limited to COPD, CHF, dysrhythmia, ACS, PE, pleural effusion, pneumonthorax.   Amount and/or Complexity of Data Reviewed Labs: ordered.    Details: CBC without leukocytosis or anemia, BMP with mild hypokalemia to 3.2.  BNP normal at 46, troponin mildly elevated to 36, likely demand  ischemia.  VBG with normal pH, mildly  hi bicarb to 30.   Radiology: ordered.  Risk Prescription drug management. Decision regarding hospitalization.   Patient significantly improved on BiPAP throughout her stay in the emergency department as well as after administration of multiple breathing treatments, Solu-Medrol and magnesium.  Overall do feel she would benefit from admission to the hospital despite significant improvement given acuity presentation  on arrival and ongoing dependence on BiPAP due to work of breathing.  At this time heart rate is normalized, patient is resting comfortably.  Admission to the hospitalist above.  [place] voiced understanding of her medical evaluation and treatment plan. Each of their questions answered to their expressed satisfaction.  Return precautions were given.  Patient is well-appearing, stable, and was discharged in good condition.  This chart was dictated using voice recognition software, Dragon. Despite the best efforts of this provider to proofread and correct errors, errors may still occur which can change documentation meaning.   Final Clinical Impression(s) / ED Diagnoses Final diagnoses:  COPD exacerbation [facility])    Rx / DC Orders ED Discharge Orders     None         Aura Dials 04/24/22 0342    Fatima Blank, MD 04/25/22 [phone]

## 2022-04-24 NOTE — Assessment & Plan Note (Signed)
-   On Repatha at home

## 2022-04-24 NOTE — Assessment & Plan Note (Signed)
-   Suspected due to increased work of breathing and coughing - Troponin has down trended.  Chest pain considered noncardiac in etiology -EKG reviewed and nonischemic.  T wave inverted in V2 is present on prior EKG as well

## 2022-04-24 NOTE — Progress Notes (Signed)
Patient was transported from the ED to ICU without any issues. Patient tolerated well

## 2022-04-24 NOTE — Telephone Encounter (Signed)
Please call patient to schedule early follow up visit post discharge  May see APP or Dr Verlee Monte as early as possible for further management- chronic azithro vs dupixent

## 2022-04-24 NOTE — Assessment & Plan Note (Signed)
-   Currently normotensive.  Will start treatment if needed

## 2022-04-25 ENCOUNTER — Encounter (HOSPITAL_COMMUNITY): Payer: Self-pay | Admitting: Internal Medicine

## 2022-04-25 DIAGNOSIS — J9601 Acute respiratory failure with hypoxia: Secondary | ICD-10-CM | POA: Diagnosis not present

## 2022-04-25 DIAGNOSIS — J441 Chronic obstructive pulmonary disease with (acute) exacerbation: Secondary | ICD-10-CM | POA: Diagnosis not present

## 2022-04-25 DIAGNOSIS — J101 Influenza due to other identified influenza virus with other respiratory manifestations: Secondary | ICD-10-CM | POA: Diagnosis not present

## 2022-04-25 DIAGNOSIS — J9602 Acute respiratory failure with hypercapnia: Secondary | ICD-10-CM | POA: Diagnosis not present

## 2022-04-25 LAB — MAGNESIUM: Magnesium: 2.4 mg/dL (ref 1.7–2.4)

## 2022-04-25 LAB — CBC WITH DIFFERENTIAL/PLATELET
Abs Immature Granulocytes: 0.03 10*3/uL (ref 0.00–0.07)
Basophils Absolute: 0 10*3/uL (ref 0.0–0.1)
Basophils Relative: 0 %
Eosinophils Absolute: 0 10*3/uL (ref 0.0–0.5)
Eosinophils Relative: 0 %
HCT: 35.8 % — ABNORMAL LOW (ref 36.0–46.0)
Hemoglobin: 11.2 g/dL — ABNORMAL LOW (ref 12.0–15.0)
Immature Granulocytes: 0 %
Lymphocytes Relative: 5 %
Lymphs Abs: 0.4 10*3/uL — ABNORMAL LOW (ref 0.7–4.0)
MCH: 29.1 pg (ref 26.0–34.0)
MCHC: 31.3 g/dL (ref 30.0–36.0)
MCV: 93 fL (ref 80.0–100.0)
Monocytes Absolute: 0.2 10*3/uL (ref 0.1–1.0)
Monocytes Relative: 3 %
Neutro Abs: 6.6 10*3/uL (ref 1.7–7.7)
Neutrophils Relative %: 92 %
Platelets: 223 10*3/uL (ref 150–400)
RBC: 3.85 MIL/uL — ABNORMAL LOW (ref 3.87–5.11)
RDW: 14.8 % (ref 11.5–15.5)
WBC: 7.2 10*3/uL (ref 4.0–10.5)
nRBC: 0 % (ref 0.0–0.2)

## 2022-04-25 LAB — BASIC METABOLIC PANEL
Anion gap: 9 (ref 5–15)
BUN: 10 mg/dL (ref 8–23)
CO2: 27 mmol/L (ref 22–32)
Calcium: 8.8 mg/dL — ABNORMAL LOW (ref 8.9–10.3)
Chloride: 102 mmol/L (ref 98–111)
Creatinine, Ser: 0.66 mg/dL (ref 0.44–1.00)
GFR, Estimated: >60 mL/min (ref >60)
Glucose, Bld: 131 mg/dL — ABNORMAL HIGH (ref 70–99)
Potassium: 4.4 mmol/L (ref 3.5–5.1)
Sodium: 138 mmol/L (ref 135–145)

## 2022-04-25 MED ORDER — ALBUTEROL SULFATE HFA 108 (90 BASE) MCG/ACT IN AERS: 2.0000 | INHALATION_SPRAY | RESPIRATORY_TRACT | 5 refills | Status: DC | PRN

## 2022-04-25 MED ORDER — PREDNISONE 20 MG PO TABS: 40.0000 mg | ORAL_TABLET | Freq: Every day | ORAL | 0 refills | Status: AC

## 2022-04-25 MED ORDER — OSELTAMIVIR PHOSPHATE 75 MG PO CAPS: 75.0000 mg | ORAL_CAPSULE | Freq: Two times a day (BID) | ORAL | 0 refills | Status: AC

## 2022-04-25 NOTE — Discharge Summary (Signed)
Physician Discharge Summary   Calianne Larue PZW:[id number] DOB: [birth year] DOA: 04/24/2022  PCP: Benito Mccreedy, MD  Admit date: 04/24/2022 Discharge date: 04/25/2022  Barriers to discharge: none  Admitted From: home Disposition:  home Discharging physician: Dwyane Dee, MD  Recommendations for Outpatient Follow-up:  Follow up with pulmonology   Home Health:  Equipment/Devices:   Discharge Condition: stable CODE STATUS: Full Diet recommendation:  Diet Orders (From admission, onward)     Start     Ordered   04/25/22 0947  Diet regular Room service appropriate? Yes; Fluid consistency: Thin  Diet effective now       Question Answer Comment  Room service appropriate? Yes   Fluid consistency: Thin      04/25/22 0946   04/25/22 0000  Diet general        04/25/22 0950            Hospital Course: Ms. Kosinski is a 61 yo female with PMH COPD (follows with pulmonology), chronic hypoxic respiratory failure on 5 L at home, GERD, HLD, CAD who presented with worsening shortness of breath. She was recently seen outpatient by pulmonology on 04/15/2022 and started on prednisone taper and short course of azithromycin. She has also been started on trial of Trelegy Ellipta instead of Breztri. She remains compliant and on Daliresp too.  Next steps if she were to continue to have exacerbations was to consider starting her on Dupixent and chronic azithromycin.  She underwent workup on admission.  CXR was negative for acute abnormalities. Respiratory virus testing was positive for influenza A.  She was then started on Tamiflu. She required BiPAP on admission as well due to hypercarbic respiratory failure and increased work of breathing. She responded well to breathing treatments and steroids.  She was weaned off of BiPAP back to home settings of oxygen. She was continued on Tamiflu and prednisone at discharge.  Assessment and Plan: * COPD with acute exacerbation ([place]) - presumed  due to Flu A -Treated with breathing treatments, steroids, BiPAP - Continued on Tamiflu and steroids at discharge - Outpatient follow-up with pulmonology  Acute respiratory failure with hypoxia and hypercapnia (HCC) - on 4-5L chronic O2 at home - VBG on admission: 7.33/70 -Continued on BiPAP on admission due to work of breathing and accessory muscle use -Responded well to morphine and treatments for flu -Weaned back to 4 L oxygen prior to discharge  Influenza A - Continue Tamiflu  Hypokalemia - Repleted  Essential hypertension - Currently normotensive.  Will start treatment if needed  Hyperlipidemia - On Repatha at home  Chest pain-resolved as of 04/24/2022 - Suspected due to increased work of breathing and coughing - Troponin has down trended.  Chest pain considered noncardiac in etiology -EKG reviewed and nonischemic.  T wave inverted in V2 is present on prior EKG as well       The patient's chronic medical conditions were treated accordingly per the patient's home medication regimen except as noted.  On day of discharge, patient was felt deemed stable for discharge. Patient/family member advised to call PCP or come back to ER if needed.   Principal Diagnosis: COPD with acute exacerbation [facility])  Discharge Diagnoses: Active Hospital Problems   Diagnosis Date Noted   COPD with acute exacerbation ([place]) 01/10/2022    Priority: 1.   Acute respiratory failure with hypoxia and hypercapnia ([place]) 01/10/2022    Priority: 1.   Influenza A 04/24/2022    Priority: 2.   Hypokalemia 09/09/2011    Priority:  3.   COPD exacerbation ([place]) 04/24/2022   Essential hypertension 07/10/2018   Hyperlipidemia 02/19/2018    Resolved Hospital Problems   Diagnosis Date Noted Date Resolved   Chest pain 04/24/2022 04/24/2022     Discharge Instructions     Diet general   Complete by: As directed    Increase activity slowly   Complete by: As directed       Allergies as of  04/25/2022       Reactions   Amoxicillin Shortness Of Breath   Tylenol [acetaminophen] Shortness Of Breath   Statins    Zetia [ezetimibe]         Medication List     STOP taking these medications    azithromycin 250 MG tablet Commonly known as: ZITHROMAX   Breztri Aerosphere 160-9-4.8 MCG/ACT Aero Generic drug: Budeson-Glycopyrrol-Formoterol       TAKE these medications    albuterol (2.5 MG/3ML) 0.083% nebulizer solution Commonly known as: PROVENTIL USE 1 VIAL IN NEBULIZER EVERY 6 HOURS - as needed for wheezing or shortness of breath What changed: See the new instructions.   albuterol 108 (90 Base) MCG/ACT inhaler Commonly known as: VENTOLIN HFA Inhale 2 puffs into the lungs every 4 (four) hours as needed for wheezing or shortness of breath. What changed: See the new instructions.   famotidine 20 MG tablet Commonly known as: PEPCID Take 1 tablet (20 mg total) by mouth at bedtime.   ibuprofen 200 MG tablet Commonly known as: ADVIL Take 400 mg by mouth every 6 (six) hours as needed for fever, headache or mild pain.   montelukast 10 MG tablet Commonly known as: SINGULAIR TAKE 1 TABLET BY MOUTH EVERYDAY AT BEDTIME What changed: See the new instructions.   nitroGLYCERIN 0.4 MG SL tablet Commonly known as: NITROSTAT Place 1 tablet (0.4 mg total) under the tongue every 5 (five) minutes as needed for chest pain.   oseltamivir 75 MG capsule Commonly known as: TAMIFLU Take 1 capsule (75 mg total) by mouth 2 (two) times daily for 4 days.   OXYGEN Inhale 4 L into the lungs at bedtime.   predniSONE 20 MG tablet Commonly known as: DELTASONE Take 2 tablets (40 mg total) by mouth daily for 5 days. What changed:  medication strength how much to take how to take this when to take this additional instructions   Repatha SureClick 789 MG/ML Soaj Generic drug: Evolocumab INJECT 140 MG INTO THE SKIN EVERY 14 (FOURTEEN) DAYS.   Roflumilast 250 MCG Tabs Commonly  known as: Daliresp Take 1 tablet by mouth daily.   roflumilast 500 MCG Tabs tablet Commonly known as: DALIRESP Take 1 tablet (500 mcg total) by mouth daily. Start taking on: May 13, 2022   Trelegy Ellipta 200-62.5-25 MCG/ACT Aepb Generic drug: Fluticasone-Umeclidin-Vilant Inhale 1 puff into the lungs daily.        Allergies  Allergen Reactions   Amoxicillin Shortness Of Breath   Tylenol [Acetaminophen] Shortness Of Breath   Statins    Zetia [Ezetimibe]     Consultations:   Procedures:   Discharge Exam: BP (!) 151/87 (BP Location: Left Arm)   Pulse 97   Temp 98.2 F (36.8 C) (Axillary)   Resp (!) 28   Ht '5\' 3"'$  (1.6 m)   Wt 78.1 kg   SpO2 95%   BMI 30.50 kg/m  Physical Exam Constitutional:      General: She is not in acute distress.    Appearance: She is not ill-appearing.     Comments:  Much more comfortable appearing this morning  HENT:     Head: Normocephalic and atraumatic.     Mouth/Throat:     Mouth: Mucous membranes are moist.  Eyes:     Extraocular Movements: Extraocular movements intact.  Cardiovascular:     Rate and Rhythm: Normal rate and regular rhythm.  Pulmonary:     Effort: Pulmonary effort is normal. No respiratory distress.     Breath sounds: No wheezing.     Comments: Greatly improved coarse sounds Abdominal:     General: Bowel sounds are normal. There is no distension.     Palpations: Abdomen is soft.     Tenderness: There is no abdominal tenderness.  Musculoskeletal:        General: Normal range of motion.     Cervical back: Normal range of motion and neck supple.  Skin:    General: Skin is warm and dry.  Neurological:     General: No focal deficit present.     Mental Status: She is alert.  Psychiatric:        Mood and Affect: Mood normal.      The results of significant diagnostics from this hospitalization (including imaging, microbiology, ancillary and laboratory) are listed below for reference.    Microbiology: Recent Results (from the past 240 hour(s))  Resp Panel by RT-PCR (Flu A&B, Covid) Anterior Nasal Swab     Status: Abnormal   Collection Time: 04/24/22  7:01 AM   Specimen: Anterior Nasal Swab  Result Value Ref Range Status   SARS Coronavirus 2 by RT PCR NEGATIVE NEGATIVE Final    Comment: (NOTE) SARS-CoV-2 target nucleic acids are NOT DETECTED.  The SARS-CoV-2 RNA is generally detectable in upper respiratory specimens during the acute phase of infection. The lowest concentration of SARS-CoV-2 viral copies this assay can detect is 138 copies/mL. A negative result does not preclude SARS-Cov-2 infection and should not be used as the sole basis for treatment or other patient management decisions. A negative result may occur with  improper specimen collection/handling, submission of specimen other than nasopharyngeal swab, presence of viral mutation(s) within the areas targeted by this assay, and inadequate number of viral copies(<138 copies/mL). A negative result must be combined with clinical observations, patient history, and epidemiological information. The expected result is Negative.  Fact Sheet for Patients:  EntrepreneurPulse.com.au  Fact Sheet for Healthcare Providers:  IncredibleEmployment.be  This test is no t yet approved or cleared by the [place] FDA and  has been authorized for detection and/or diagnosis of SARS-CoV-2 by FDA under an Emergency Use Authorization (EUA). This EUA will remain  in effect (meaning this test can be used) for the duration of the COVID-19 declaration under Section 564(b)(1) of the Act, 21 U.S.C.section 360bbb-3(b)(1), unless the authorization is terminated  or revoked sooner.       Influenza A by PCR POSITIVE (A) NEGATIVE Final   Influenza B by PCR NEGATIVE NEGATIVE Final    Comment: (NOTE) The Xpert Xpress SARS-CoV-2/FLU/RSV plus assay is intended as an aid in the diagnosis of  influenza from Nasopharyngeal swab specimens and should not be used as a sole basis for treatment. Nasal washings and aspirates are unacceptable for Xpert Xpress SARS-CoV-2/FLU/RSV testing.  Fact Sheet for Patients: EntrepreneurPulse.com.au  Fact Sheet for Healthcare Providers: IncredibleEmployment.be  This test is not yet approved or cleared by the [place] FDA and has been authorized for detection and/or diagnosis of SARS-CoV-2 by FDA under an Emergency Use Authorization (EUA). This EUA will  remain in effect (meaning this test can be used) for the duration of the COVID-19 declaration under Section 564(b)(1) of the Act, 21 U.S.C. section 360bbb-3(b)(1), unless the authorization is terminated or revoked.  Performed at [facility], [place] [address], [place], [place] [address]   MRSA Next Gen by PCR, Nasal     Status: None   Collection Time: 04/24/22  7:03 AM   Specimen: Nasal Mucosa; Nasal Swab  Result Value Ref Range Status   MRSA by PCR Next Gen NOT DETECTED NOT DETECTED Final    Comment: (NOTE) The GeneXpert MRSA Assay (FDA approved for NASAL specimens only), is one component of a comprehensive MRSA colonization surveillance program. It is not intended to diagnose MRSA infection nor to guide or monitor treatment for MRSA infections. Test performance is not FDA approved in patients less than 69 years old. Performed at [facility], [place] [address], [place], [place] [address]      Labs: BNP (last 3 results) Recent Labs    01/10/22 1955 04/24/22 0055  BNP 48.9 41.7   Basic Metabolic Panel: Recent Labs  Lab 04/24/22 0055 04/24/22 0246 04/25/22 0301  NA 140  --  138  K 3.2*  --  4.4  CL 103  --  102  CO2 28  --  27  GLUCOSE 135*  --  131*  BUN 8  --  10  CREATININE 0.81  --  0.66  CALCIUM 8.8*  --  8.8*  MG  --  2.7* 2.4   Liver Function Tests: No results for input(s): "AST", "ALT",  "ALKPHOS", "BILITOT", "PROT", "ALBUMIN" in the last 168 hours. No results for input(s): "LIPASE", "AMYLASE" in the last 168 hours. No results for input(s): "AMMONIA" in the last 168 hours. CBC: Recent Labs  Lab 04/24/22 0055 04/25/22 0301  WBC 8.7 7.2  NEUTROABS 7.4 6.6  HGB 12.3 11.2*  HCT 39.5 35.8*  MCV 93.6 93.0  PLT 260 223   Cardiac Enzymes: No results for input(s): "CKTOTAL", "CKMB", "CKMBINDEX", "TROPONINI" in the last 168 hours. BNP: Invalid input(s): "POCBNP" CBG: No results for input(s): "GLUCAP" in the last 168 hours. D-Dimer No results for input(s): "DDIMER" in the last 72 hours. Hgb A1c No results for input(s): "HGBA1C" in the last 72 hours. Lipid Profile No results for input(s): "CHOL", "HDL", "LDLCALC", "TRIG", "CHOLHDL", "LDLDIRECT" in the last 72 hours. Thyroid function studies No results for input(s): "TSH", "T4TOTAL", "T3FREE", "THYROIDAB" in the last 72 hours.  Invalid input(s): "FREET3" Anemia work up No results for input(s): "VITAMINB12", "FOLATE", "FERRITIN", "TIBC", "IRON", "RETICCTPCT" in the last 72 hours. Urinalysis    Component Value Date/Time   COLORURINE STRAW (A) 01/08/2020 1129   APPEARANCEUR CLEAR 01/08/2020 1129   LABSPEC 1.002 (L) 01/08/2020 1129   PHURINE 6.0 01/08/2020 1129   GLUCOSEU NEGATIVE 01/08/2020 1129   HGBUR NEGATIVE 01/08/2020 1129   BILIRUBINUR neg 04/24/2020 1520   [place] 01/08/2020 1129   PROTEINUR Negative 04/24/2020 1520   PROTEINUR NEGATIVE 01/08/2020 1129   UROBILINOGEN negative (A) 04/24/2020 1520   UROBILINOGEN 0.2 03/07/2015 1830   NITRITE neg 04/24/2020 1520   NITRITE NEGATIVE 01/08/2020 1129   LEUKOCYTESUR Trace (A) 04/24/2020 1520   LEUKOCYTESUR MODERATE (A) 01/08/2020 1129   Sepsis Labs Recent Labs  Lab 04/24/22 0055 04/25/22 0301  WBC 8.7 7.2   Microbiology Recent Results (from the past 240 hour(s))  Resp Panel by RT-PCR (Flu A&B, Covid) Anterior Nasal Swab     Status: Abnormal  Collection Time: 04/24/22  7:01 AM   Specimen: Anterior Nasal Swab  Result Value Ref Range Status   SARS Coronavirus 2 by RT PCR NEGATIVE NEGATIVE Final    Comment: (NOTE) SARS-CoV-2 target nucleic acids are NOT DETECTED.  The SARS-CoV-2 RNA is generally detectable in upper respiratory specimens during the acute phase of infection. The lowest concentration of SARS-CoV-2 viral copies this assay can detect is 138 copies/mL. A negative result does not preclude SARS-Cov-2 infection and should not be used as the sole basis for treatment or other patient management decisions. A negative result may occur with  improper specimen collection/handling, submission of specimen other than nasopharyngeal swab, presence of viral mutation(s) within the areas targeted by this assay, and inadequate number of viral copies(<138 copies/mL). A negative result must be combined with clinical observations, patient history, and epidemiological information. The expected result is Negative.  Fact Sheet for Patients:  EntrepreneurPulse.com.au  Fact Sheet for Healthcare Providers:  IncredibleEmployment.be  This test is no t yet approved or cleared by the [place] FDA and  has been authorized for detection and/or diagnosis of SARS-CoV-2 by FDA under an Emergency Use Authorization (EUA). This EUA will remain  in effect (meaning this test can be used) for the duration of the COVID-19 declaration under Section 564(b)(1) of the Act, 21 U.S.C.section 360bbb-3(b)(1), unless the authorization is terminated  or revoked sooner.       Influenza A by PCR POSITIVE (A) NEGATIVE Final   Influenza B by PCR NEGATIVE NEGATIVE Final    Comment: (NOTE) The Xpert Xpress SARS-CoV-2/FLU/RSV plus assay is intended as an aid in the diagnosis of influenza from Nasopharyngeal swab specimens and should not be used as a sole basis for treatment. Nasal washings and aspirates are unacceptable for  Xpert Xpress SARS-CoV-2/FLU/RSV testing.  Fact Sheet for Patients: EntrepreneurPulse.com.au  Fact Sheet for Healthcare Providers: IncredibleEmployment.be  This test is not yet approved or cleared by the [place] FDA and has been authorized for detection and/or diagnosis of SARS-CoV-2 by FDA under an Emergency Use Authorization (EUA). This EUA will remain in effect (meaning this test can be used) for the duration of the COVID-19 declaration under Section 564(b)(1) of the Act, 21 U.S.C. section 360bbb-3(b)(1), unless the authorization is terminated or revoked.  Performed at [facility], [place] [address], [place], [place] [address]   MRSA Next Gen by PCR, Nasal     Status: None   Collection Time: 04/24/22  7:03 AM   Specimen: Nasal Mucosa; Nasal Swab  Result Value Ref Range Status   MRSA by PCR Next Gen NOT DETECTED NOT DETECTED Final    Comment: (NOTE) The GeneXpert MRSA Assay (FDA approved for NASAL specimens only), is one component of a comprehensive MRSA colonization surveillance program. It is not intended to diagnose MRSA infection nor to guide or monitor treatment for MRSA infections. Test performance is not FDA approved in patients less than 65 years old. Performed at [facility], [place] [address] [place], [place] [address]     Procedures/Studies: DG Chest Port 1 View  Result Date: 04/24/2022 CLINICAL DATA:  Respiratory distress EXAM: PORTABLE CHEST 1 VIEW COMPARISON:  03/22/2022 FINDINGS: Cardiac shadow is prominent accentuated by the portable technique. The lungs are well aerated bilaterally. No focal infiltrate or sizable effusion is seen. No bony abnormality is noted. IMPRESSION: No active disease. Electronically Signed   By: Inez Catalina M.D.   On: 04/24/2022 01:11     Time coordinating discharge: Over  30 minutes    Dwyane Dee, MD  [facility] 04/25/2022, 3:46 PM

## 2022-05-04 ENCOUNTER — Emergency Department (HOSPITAL_COMMUNITY): Payer: BC Managed Care – PPO

## 2022-05-04 ENCOUNTER — Other Ambulatory Visit: Payer: Self-pay

## 2022-05-04 ENCOUNTER — Inpatient Hospital Stay (HOSPITAL_COMMUNITY)
Admission: EM | Admit: 2022-05-04 | Discharge: 2022-05-08 | DRG: 190 | Disposition: A | Discharge status: Home / Self Care | Payer: BC Managed Care – PPO | Attending: Internal Medicine | Admitting: Internal Medicine

## 2022-05-04 DIAGNOSIS — Z8249 Family history of ischemic heart disease and other diseases of the circulatory system: Secondary | ICD-10-CM

## 2022-05-04 DIAGNOSIS — I25118 Atherosclerotic heart disease of native coronary artery with other forms of angina pectoris: Secondary | ICD-10-CM | POA: Diagnosis not present

## 2022-05-04 DIAGNOSIS — Z823 Family history of stroke: Secondary | ICD-10-CM | POA: Diagnosis not present

## 2022-05-04 DIAGNOSIS — I2510 Atherosclerotic heart disease of native coronary artery without angina pectoris: Secondary | ICD-10-CM | POA: Diagnosis present

## 2022-05-04 DIAGNOSIS — J441 Chronic obstructive pulmonary disease with (acute) exacerbation: Secondary | ICD-10-CM | POA: Diagnosis present

## 2022-05-04 DIAGNOSIS — Z888 Allergy status to other drugs, medicaments and biological substances status: Secondary | ICD-10-CM | POA: Diagnosis not present

## 2022-05-04 DIAGNOSIS — E669 Obesity, unspecified: Secondary | ICD-10-CM | POA: Diagnosis present

## 2022-05-04 DIAGNOSIS — J9811 Atelectasis: Secondary | ICD-10-CM | POA: Diagnosis present

## 2022-05-04 DIAGNOSIS — Z6830 Body mass index (BMI) 30.0-30.9, adult: Secondary | ICD-10-CM | POA: Diagnosis not present

## 2022-05-04 DIAGNOSIS — Z79899 Other long term (current) drug therapy: Secondary | ICD-10-CM | POA: Diagnosis not present

## 2022-05-04 DIAGNOSIS — K219 Gastro-esophageal reflux disease without esophagitis: Secondary | ICD-10-CM | POA: Diagnosis present

## 2022-05-04 DIAGNOSIS — Z87891 Personal history of nicotine dependence: Secondary | ICD-10-CM

## 2022-05-04 DIAGNOSIS — E876 Hypokalemia: Secondary | ICD-10-CM | POA: Diagnosis present

## 2022-05-04 DIAGNOSIS — E7800 Pure hypercholesterolemia, unspecified: Secondary | ICD-10-CM | POA: Diagnosis not present

## 2022-05-04 DIAGNOSIS — J449 Chronic obstructive pulmonary disease, unspecified: Secondary | ICD-10-CM | POA: Diagnosis present

## 2022-05-04 DIAGNOSIS — I10 Essential (primary) hypertension: Secondary | ICD-10-CM | POA: Diagnosis present

## 2022-05-04 DIAGNOSIS — Z825 Family history of asthma and other chronic lower respiratory diseases: Secondary | ICD-10-CM

## 2022-05-04 DIAGNOSIS — Z880 Allergy status to penicillin: Secondary | ICD-10-CM

## 2022-05-04 DIAGNOSIS — F419 Anxiety disorder, unspecified: Secondary | ICD-10-CM | POA: Diagnosis present

## 2022-05-04 DIAGNOSIS — J9611 Chronic respiratory failure with hypoxia: Secondary | ICD-10-CM

## 2022-05-04 DIAGNOSIS — E785 Hyperlipidemia, unspecified: Secondary | ICD-10-CM | POA: Diagnosis present

## 2022-05-04 DIAGNOSIS — J9621 Acute and chronic respiratory failure with hypoxia: Secondary | ICD-10-CM | POA: Diagnosis present

## 2022-05-04 LAB — COMPREHENSIVE METABOLIC PANEL
ALT: 28 U/L (ref 0–44)
AST: 20 U/L (ref 15–41)
Albumin: 3.4 g/dL — ABNORMAL LOW (ref 3.5–5.0)
Alkaline Phosphatase: 46 U/L (ref 38–126)
Anion gap: 12 (ref 5–15)
BUN: 7 mg/dL — ABNORMAL LOW (ref 8–23)
CO2: 30 mmol/L (ref 22–32)
Calcium: 8.7 mg/dL — ABNORMAL LOW (ref 8.9–10.3)
Chloride: 94 mmol/L — ABNORMAL LOW (ref 98–111)
Creatinine, Ser: 0.7 mg/dL (ref 0.44–1.00)
GFR, Estimated: >60 mL/min (ref >60)
Glucose, Bld: 116 mg/dL — ABNORMAL HIGH (ref 70–99)
Potassium: 3.2 mmol/L — ABNORMAL LOW (ref 3.5–5.1)
Sodium: 136 mmol/L (ref 135–145)
Total Bilirubin: 1.7 mg/dL — ABNORMAL HIGH (ref 0.3–1.2)
Total Protein: 7.1 g/dL (ref 6.5–8.1)

## 2022-05-04 LAB — RESPIRATORY PANEL BY PCR

## 2022-05-04 LAB — URINALYSIS, ROUTINE W REFLEX MICROSCOPIC
Bilirubin Urine: NEGATIVE
Glucose, UA: NEGATIVE mg/dL
Hgb urine dipstick: NEGATIVE
Ketones, ur: 80 mg/dL — AB
Leukocytes,Ua: NEGATIVE
Nitrite: NEGATIVE
Protein, ur: NEGATIVE mg/dL
Specific Gravity, Urine: 1.02 (ref 1.005–1.030)
pH: 5 (ref 5.0–8.0)

## 2022-05-04 LAB — CBC WITH DIFFERENTIAL/PLATELET
Abs Immature Granulocytes: 0.06 10*3/uL (ref 0.00–0.07)
Basophils Absolute: 0 10*3/uL (ref 0.0–0.1)
Basophils Relative: 0 %
Eosinophils Absolute: 0 10*3/uL (ref 0.0–0.5)
Eosinophils Relative: 0 %
HCT: 36.9 % (ref 36.0–46.0)
Hemoglobin: 11.6 g/dL — ABNORMAL LOW (ref 12.0–15.0)
Immature Granulocytes: 1 %
Lymphocytes Relative: 13 %
Lymphs Abs: 1.1 10*3/uL (ref 0.7–4.0)
MCH: 28.6 pg (ref 26.0–34.0)
MCHC: 31.4 g/dL (ref 30.0–36.0)
MCV: 90.9 fL (ref 80.0–100.0)
Monocytes Absolute: 0.7 10*3/uL (ref 0.1–1.0)
Monocytes Relative: 8 %
Neutro Abs: 6.9 10*3/uL (ref 1.7–7.7)
Neutrophils Relative %: 78 %
Platelets: 333 10*3/uL (ref 150–400)
RBC: 4.06 MIL/uL (ref 3.87–5.11)
RDW: 14.5 % (ref 11.5–15.5)
WBC: 8.9 10*3/uL (ref 4.0–10.5)
nRBC: 0 % (ref 0.0–0.2)

## 2022-05-04 LAB — APTT: aPTT: 35 seconds (ref 24–36)

## 2022-05-04 LAB — MAGNESIUM: Magnesium: 2.9 mg/dL — ABNORMAL HIGH (ref 1.7–2.4)

## 2022-05-04 LAB — BLOOD GAS, VENOUS
Acid-Base Excess: 6.3 mmol/L — ABNORMAL HIGH (ref 0.0–2.0)
Bicarbonate: 32.2 mmol/L — ABNORMAL HIGH (ref 20.0–28.0)
O2 Saturation: 72.5 %
Patient temperature: 37
pCO2, Ven: 52 mmHg (ref 44–60)
pH, Ven: 7.4 (ref 7.25–7.43)
pO2, Ven: 44 mmHg (ref 32–45)

## 2022-05-04 LAB — PROTIME-INR
INR: 1.1 (ref 0.8–1.2)
Prothrombin Time: 14.6 seconds (ref 11.4–15.2)

## 2022-05-04 LAB — LACTIC ACID, PLASMA: Lactic Acid, Venous: 0.9 mmol/L (ref 0.5–1.9)

## 2022-05-04 MED ORDER — IPRATROPIUM-ALBUTEROL 0.5-2.5 (3) MG/3ML IN SOLN
3.0000 mL | Freq: Four times a day (QID) | RESPIRATORY_TRACT | Status: DC
  Administered 2022-05-04 – 2022-05-06 (×7): 3 mL via RESPIRATORY_TRACT
  Filled 2022-05-04 (×7): qty 3

## 2022-05-04 MED ORDER — POTASSIUM CHLORIDE 10 MEQ/100ML IV SOLN
10.0000 meq | INTRAVENOUS | Status: AC
  Administered 2022-05-04 – 2022-05-05 (×2): 10 meq via INTRAVENOUS
  Filled 2022-05-04: qty 100

## 2022-05-04 MED ORDER — ENOXAPARIN SODIUM 40 MG/0.4ML IJ SOSY
40.0000 mg | PREFILLED_SYRINGE | INTRAMUSCULAR | Status: DC
  Administered 2022-05-05 – 2022-05-07 (×3): 40 mg via SUBCUTANEOUS
  Filled 2022-05-04 (×4): qty 0.4

## 2022-05-04 MED ORDER — IPRATROPIUM-ALBUTEROL 0.5-2.5 (3) MG/3ML IN SOLN
RESPIRATORY_TRACT | Status: AC
  Administered 2022-05-04: 3 mL via RESPIRATORY_TRACT
  Filled 2022-05-04: qty 3

## 2022-05-04 MED ORDER — ORAL CARE MOUTH RINSE: 15.0000 mL | OROMUCOSAL | Status: DC | PRN

## 2022-05-04 MED ORDER — IPRATROPIUM-ALBUTEROL 0.5-2.5 (3) MG/3ML IN SOLN
3.0000 mL | Freq: Once | RESPIRATORY_TRACT | Status: AC
  Filled 2022-05-04: qty 3

## 2022-05-04 MED ORDER — MORPHINE SULFATE (PF) 2 MG/ML IV SOLN
1.0000 mg | INTRAVENOUS | Status: DC | PRN
  Administered 2022-05-04 – 2022-05-06 (×5): 1 mg via INTRAVENOUS
  Filled 2022-05-04 (×5): qty 1

## 2022-05-04 MED ORDER — METHYLPREDNISOLONE SODIUM SUCC 40 MG IJ SOLR
40.0000 mg | Freq: Two times a day (BID) | INTRAMUSCULAR | Status: AC
  Administered 2022-05-04 – 2022-05-05 (×2): 40 mg via INTRAVENOUS
  Filled 2022-05-04 (×2): qty 1

## 2022-05-04 MED ORDER — ALPRAZOLAM 0.25 MG PO TABS
0.2500 mg | ORAL_TABLET | Freq: Once | ORAL | Status: AC
  Administered 2022-05-04: 0.25 mg via ORAL
  Filled 2022-05-04: qty 1

## 2022-05-04 MED ORDER — POTASSIUM CHLORIDE 10 MEQ/100ML IV SOLN
10.0000 meq | INTRAVENOUS | Status: AC
  Administered 2022-05-04 (×2): 10 meq via INTRAVENOUS
  Filled 2022-05-04 (×3): qty 100

## 2022-05-04 MED ORDER — ONDANSETRON HCL 4 MG/2ML IJ SOLN: 4.0000 mg | Freq: Four times a day (QID) | INTRAMUSCULAR | Status: DC | PRN

## 2022-05-04 MED ORDER — PANTOPRAZOLE SODIUM 40 MG IV SOLR
40.0000 mg | INTRAVENOUS | Status: DC
  Administered 2022-05-04 – 2022-05-05 (×2): 40 mg via INTRAVENOUS
  Filled 2022-05-04 (×2): qty 10

## 2022-05-04 MED ORDER — ORAL CARE MOUTH RINSE
15.0000 mL | OROMUCOSAL | Status: DC
  Administered 2022-05-04 – 2022-05-08 (×14): 15 mL via OROMUCOSAL

## 2022-05-04 MED ORDER — GUAIFENESIN ER 600 MG PO TB12
600.0000 mg | ORAL_TABLET | Freq: Two times a day (BID) | ORAL | Status: DC
  Administered 2022-05-04 – 2022-05-07 (×5): 600 mg via ORAL
  Filled 2022-05-04 (×5): qty 1

## 2022-05-04 MED ORDER — PREDNISONE 20 MG PO TABS: 40.0000 mg | ORAL_TABLET | Freq: Every day | ORAL | Status: DC

## 2022-05-04 MED ORDER — SODIUM CHLORIDE 0.9 % IV SOLN: INTRAVENOUS | Status: AC

## 2022-05-04 MED ORDER — ONDANSETRON HCL 4 MG PO TABS: 4.0000 mg | ORAL_TABLET | Freq: Four times a day (QID) | ORAL | Status: DC | PRN

## 2022-05-04 MED ORDER — IBUPROFEN 200 MG PO TABS: 400.0000 mg | ORAL_TABLET | Freq: Four times a day (QID) | ORAL | Status: DC | PRN

## 2022-05-04 MED ORDER — METHYLPREDNISOLONE SODIUM SUCC 125 MG IJ SOLR
125.0000 mg | Freq: Once | INTRAMUSCULAR | Status: AC
  Administered 2022-05-04: 125 mg via INTRAVENOUS
  Filled 2022-05-04: qty 2

## 2022-05-04 MED ORDER — CHLORHEXIDINE GLUCONATE CLOTH 2 % EX PADS
6.0000 | MEDICATED_PAD | Freq: Every day | CUTANEOUS | Status: DC
  Administered 2022-05-04 – 2022-05-08 (×5): 6 via TOPICAL

## 2022-05-04 NOTE — ED Notes (Signed)
Pt noted to be sleeping.  Respirations even and unlabored.  Bipap in place.  Vitals WDL.

## 2022-05-04 NOTE — ED Triage Notes (Signed)
Pt arrives from home via [facility], per report, pt was Diagnosed with the FLU on the 10th, admitted. Has been SOB tonight. Diminished/exp wheezing on arrival, intial 80's%, RR 40's, placed on NRB saturations were, then started to desat. Placed on CPAP, now upper 90's. 2 duonebs, 125 solumedrol, 2gm magnesium. RR 26, 128/60, hr 106. IV established in the left forearm.
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
## 2022-05-04 NOTE — Progress Notes (Signed)
RT NOTE:  Pt transported to ICU from ED via BiPAP with no complications noted.

## 2022-05-04 NOTE — ED Notes (Signed)
ICU staff came to transport Pt.  Unable to get updated BP prior to transport.

## 2022-05-04 NOTE — Plan of Care (Signed)
  Problem: Activity: Goal: Ability to tolerate increased activity will improve Outcome: Progressing   Problem: Respiratory: Goal: Ability to maintain a clear airway will improve Outcome: Progressing   Problem: Pain Managment: Goal: General experience of comfort will improve Outcome: Progressing

## 2022-05-04 NOTE — ED Provider Notes (Signed)
[facility] Emergency Department Provider Note MRN:  [id number]  Arrival date & time: 05/04/22     Chief Complaint   Respiratory Distress   History of Present Illness   [name] is a 61 y.o. year-old female presents to the ED with chief complaint of SOB.  Hx of chronic hypoxia on 5L [place] 24/7.  Was diagnosed and admitted for flu about 10 days ago.  States that she had increasing SOB tonight.  EMS was called out and patient O2 sats were in the 80s.  She was tachypneic to the 40s.  Was put on NRB by EMS, but still had low O2 sats and was put on CPAP with good improvement.  She was given solumedrol and magnesium by EMS.    History provided by patient.   Review of Systems  Pertinent positive and negative review of systems noted in HPI.    Physical Exam   Vitals:   05/04/22 0413 05/04/22 0422  BP: 117/74   Pulse: (!) 109 (!) 106  Resp: (!) 27 (!) 33  Temp:  98.8 F (37.1 C)  SpO2: 100% 100%    CONSTITUTIONAL:  short of breath-appearing, on BiPAP NEURO:  Alert and oriented x 3, CN 3-12 grossly intact EYES:  eyes equal and reactive ENT/NECK:  Supple, no stridor  CARDIO:  tachycardic, regular rhythm, appears well-perfused  PULM:  Moderate respiratory distress, increased WOB, tachypneic GI/GU:  non-distended, non tender MSK/SPINE:  No gross deformities, no edema, moves all extremities  SKIN:  no rash, atraumatic   *Additional and/or pertinent findings included in MDM below  Diagnostic and Interventional Summary    EKG Interpretation  Date/Time:    Ventricular Rate:    PR Interval:    QRS Duration:   QT Interval:    QTC Calculation:   R Axis:     Text Interpretation:         Labs Reviewed  COMPREHENSIVE METABOLIC PANEL - Abnormal; Notable for the following components:      Result Value   Potassium 3.2 (*)    Chloride 94 (*)    Glucose, Bld 116 (*)    BUN 7 (*)    Calcium 8.7 (*)    Albumin 3.4 (*)    Total Bilirubin 1.7 (*)     All other components within normal limits  CBC WITH DIFFERENTIAL/PLATELET - Abnormal; Notable for the following components:   Hemoglobin 11.6 (*)    All other components within normal limits  CULTURE, BLOOD (ROUTINE X 2)  CULTURE, BLOOD (ROUTINE X 2)  URINE CULTURE  LACTIC ACID, PLASMA  PROTIME-INR  APTT  LACTIC ACID, PLASMA  URINALYSIS, ROUTINE W REFLEX MICROSCOPIC  BLOOD GAS, VENOUS    DG Chest Port 1 View  Final Result      Medications - No data to display   Procedures  /  Critical Care .Critical Care  Performed by: Montine Circle, PA-C Authorized by: Montine Circle, PA-C   Critical care provider statement:    Critical care time (minutes):  48   Critical care was necessary to treat or prevent imminent or life-threatening deterioration of the following conditions:  Respiratory failure   Critical care was time spent personally by me on the following activities:  Development of treatment plan with patient or surrogate, discussions with consultants, evaluation of patient's response to treatment, examination of patient, ordering and review of laboratory studies, ordering and review of radiographic studies, ordering and performing treatments and interventions, pulse oximetry, re-evaluation of patient's  condition and review of old charts   ED Course and Medical Decision Making  I have reviewed the triage vital signs, the nursing notes, and pertinent available records from the EMR.  Social Determinants Affecting Complexity of Care: Patient has no clinically significant social determinants affecting this chief complaint..   ED Course:    Medical Decision Making Patient here with SOB.  Hx of COPD.  Wears 5L  24/7.  Was recently admitted for COPD exacerbation in the setting of influenza A diagnosis.  States that she had been doing well until last night when her SOB significantly worsened.  Amount and/or Complexity of Data Reviewed Labs: ordered.    Details: No  leukocytosis Normal lactic Mild hypokalemia of 3.2 VBG pending Radiology: ordered and independent interpretation performed.    Details: No obvious opacity ECG/medicine tests: ordered and independent interpretation performed.    Details: Sinus tachycardia.  Risk Decision regarding hospitalization.     Consultants: I discussed the case with Hospitalist, Dr. Nevada Crane, who is appreciated for admitting.   Treatment and Plan: Patient's exam and diagnostic results are concerning for COPD exacerbation.  Feel that patient will need admission to the hospital for further treatment and evaluation.  Patient seen by and discussed with attending physician, Dr. Roxanne Mins, who agrees with plan.  Final Clinical Impressions(s) / ED Diagnoses     ICD-10-CM   1. COPD exacerbation [facility])  J44.1       ED Discharge Orders     None         Discharge Instructions Discussed with and Provided to Patient:   Discharge Instructions   None      Montine Circle, PA-C 86/76/19 [id number]    Delora Fuel, MD 26/71/24 [phone]

## 2022-05-05 ENCOUNTER — Encounter (HOSPITAL_COMMUNITY): Payer: Self-pay | Admitting: Internal Medicine

## 2022-05-05 DIAGNOSIS — K219 Gastro-esophageal reflux disease without esophagitis: Secondary | ICD-10-CM

## 2022-05-05 DIAGNOSIS — I25118 Atherosclerotic heart disease of native coronary artery with other forms of angina pectoris: Secondary | ICD-10-CM | POA: Diagnosis not present

## 2022-05-05 DIAGNOSIS — E876 Hypokalemia: Secondary | ICD-10-CM

## 2022-05-05 DIAGNOSIS — J441 Chronic obstructive pulmonary disease with (acute) exacerbation: Secondary | ICD-10-CM | POA: Diagnosis not present

## 2022-05-05 DIAGNOSIS — J9621 Acute and chronic respiratory failure with hypoxia: Secondary | ICD-10-CM | POA: Diagnosis not present

## 2022-05-05 DIAGNOSIS — E7800 Pure hypercholesterolemia, unspecified: Secondary | ICD-10-CM

## 2022-05-05 DIAGNOSIS — I10 Essential (primary) hypertension: Secondary | ICD-10-CM

## 2022-05-05 LAB — COMPREHENSIVE METABOLIC PANEL
ALT: 21 U/L (ref 0–44)
AST: 13 U/L — ABNORMAL LOW (ref 15–41)
Albumin: 3.1 g/dL — ABNORMAL LOW (ref 3.5–5.0)
Alkaline Phosphatase: 43 U/L (ref 38–126)
Anion gap: 9 (ref 5–15)
BUN: 11 mg/dL (ref 8–23)
CO2: 26 mmol/L (ref 22–32)
Calcium: 8.4 mg/dL — ABNORMAL LOW (ref 8.9–10.3)
Chloride: 101 mmol/L (ref 98–111)
Creatinine, Ser: 0.57 mg/dL (ref 0.44–1.00)
GFR, Estimated: >60 mL/min (ref >60)
Glucose, Bld: 137 mg/dL — ABNORMAL HIGH (ref 70–99)
Potassium: 4.4 mmol/L (ref 3.5–5.1)
Sodium: 136 mmol/L (ref 135–145)
Total Bilirubin: 0.9 mg/dL (ref 0.3–1.2)
Total Protein: 6.7 g/dL (ref 6.5–8.1)

## 2022-05-05 LAB — CBC
HCT: 32.9 % — ABNORMAL LOW (ref 36.0–46.0)
Hemoglobin: 10.3 g/dL — ABNORMAL LOW (ref 12.0–15.0)
MCH: 28.5 pg (ref 26.0–34.0)
MCHC: 31.3 g/dL (ref 30.0–36.0)
MCV: 91.1 fL (ref 80.0–100.0)
Platelets: 312 10*3/uL (ref 150–400)
RBC: 3.61 MIL/uL — ABNORMAL LOW (ref 3.87–5.11)
RDW: 14.1 % (ref 11.5–15.5)
WBC: 10 10*3/uL (ref 4.0–10.5)
nRBC: 0 % (ref 0.0–0.2)

## 2022-05-05 LAB — URINE CULTURE

## 2022-05-05 NOTE — Progress Notes (Signed)
PROGRESS NOTE  [name] YBO:[id number] DOB: December 22, 1960   PCP: Benito Mccreedy, MD  Patient is from: Home.  Lives with granddaughter.  Reports ambulating independently at baseline.  DOA: 05/04/2022 LOS: 1  Chief complaints Chief Complaint  Patient presents with   Respiratory Distress     Brief Narrative / Interim history: 61 year old F with PMH of COPD/chronic hypoxic RF on 5 L, CAD, HLD, and recent hospitalization for influenza A returning with worsening shortness of breath, productive cough with whitish phlegm and admitted for acute on chronic respiratory failure with hypoxia in the setting of COPD exacerbation.  She tested positive for influenza A on 04/24/2022.  CXR with mild bibasilar atelectasis.  ABG, BMP and CBC without significant finding.  She was started on systemic steroid, nebulizers and BiPAP and admitted to stepdown.  Subjective: Seen and examined earlier this morning.  No major events overnight of this morning.  Reports improvement in her breathing.  Still with productive cough and whitish phlegm.  She denies chest pain, GI or UTI symptoms.  She denies smoking cigarettes.  Reports compliance with her inhalers including Trelegy and rescue inhaler.  Objective: Vitals:   05/05/22 1000 05/05/22 1100 05/05/22 1200 05/05/22 1348  BP: (!) 146/84 116/63 108/68   Pulse: 86 73 (!) 109   Resp: (!) 22 (!) 24 (!) 23   Temp:   (!) 97.1 F (36.2 C)   TempSrc:   Axillary   SpO2: 96% 99% 100% 99%  Height:        Examination:  GENERAL: No apparent distress.  Nontoxic. HEENT: MMM.  Vision and hearing grossly intact.  NECK: Supple.  No apparent JVD.  RESP:  No IWOB.  Diminished aeration bilaterally.  Rhonchi bilaterally. CVS:  RRR. Heart sounds normal.  ABD/GI/GU: BS+. Abd soft, NTND.  MSK/EXT:  Moves extremities. No apparent deformity. No edema.  SKIN: no apparent skin lesion or wound NEURO: Awake, alert and oriented appropriately.  No apparent focal neuro  deficit. PSYCH: Appears chest.  Procedures:  None  Microbiology summarized: Full RVP nonreactive.  Blood cultures NGTD. Urine culture with multiple species.  Assessment and plan: Principal Problem:   COPD with acute exacerbation ([place]) Active Problems:   GERD (gastroesophageal reflux disease)   CAD (coronary artery disease)   Hyperlipidemia   Essential hypertension   Acute on chronic respiratory failure with hypoxia (HCC)   Acute on chronic respiratory failure w/ hypoxia due to COPD exacerbation: Unclear cause of exacerbation.  CXR with mild bibasilar atelectasis.  ABG reassuring.  Full RVP nonreactive.  Reports improvement in her breathing.  However, exam with diminished aeration bilaterally.  Weaned off BiPAP. -Wean oxygen to home level -Continue steroids and nebulizers -BiPAP as needed  History of CAD/HLD: No anginal symptoms. -New home meds   Hypokalemia -Monitor replenish as appropriate   GERD -Protonix   Obesity Body mass index is 30.5 kg/m.          DVT prophylaxis:  enoxaparin (LOVENOX) injection 40 mg Start: 05/04/22 1700  Code Status: Full code Family Communication: None at bedside Level of care: Progressive Status is: Inpatient Remains inpatient appropriate because: Acute on chronic respiratory failure in the setting of COPD exacerbation   Final disposition: Likely home once medically cleared. Consultants:  None  Sch Meds:  Scheduled Meds:  Chlorhexidine Gluconate Cloth  6 each Topical Daily   enoxaparin (LOVENOX) injection  40 mg Subcutaneous Q24H   guaiFENesin  600 mg Oral BID   ipratropium-albuterol  3 mL Nebulization Q6H  mouth rinse  15 mL Mouth Rinse 4 times per day   pantoprazole (PROTONIX) IV  40 mg Intravenous Q24H   [START ON 05/06/2022] predniSONE  40 mg Oral Q breakfast   Continuous Infusions: PRN Meds:.morphine injection, ondansetron **OR** ondansetron (ZOFRAN) IV, mouth rinse  Antimicrobials: Anti-infectives (From  admission, onward)    None        I have personally reviewed the following labs and images: CBC: Recent Labs  Lab 05/04/22 0422 05/05/22 0256  WBC 8.9 10.0  NEUTROABS 6.9  --   HGB 11.6* 10.3*  HCT 36.9 32.9*  MCV 90.9 91.1  PLT 333 312   BMP &GFR Recent Labs  Lab 05/04/22 0422 05/05/22 0256  NA 136 136  K 3.2* 4.4  CL 94* 101  CO2 30 26  GLUCOSE 116* 137*  BUN 7* 11  CREATININE 0.70 0.57  CALCIUM 8.7* 8.4*  MG 2.9*  --    Estimated Creatinine Clearance: 71.2 mL/min (by C-G formula based on SCr of 0.57 mg/dL). Liver & Pancreas: Recent Labs  Lab 05/04/22 0422 05/05/22 0256  AST 20 13*  ALT 28 21  ALKPHOS 46 43  BILITOT 1.7* 0.9  PROT 7.1 6.7  ALBUMIN 3.4* 3.1*   No results for input(s): "LIPASE", "AMYLASE" in the last 168 hours. No results for input(s): "AMMONIA" in the last 168 hours. Diabetic: No results for input(s): "HGBA1C" in the last 72 hours. No results for input(s): "GLUCAP" in the last 168 hours. Cardiac Enzymes: No results for input(s): "CKTOTAL", "CKMB", "CKMBINDEX", "TROPONINI" in the last 168 hours. No results for input(s): "PROBNP" in the last 8760 hours. Coagulation Profile: Recent Labs  Lab 05/04/22 0422  INR 1.1   Thyroid Function Tests: No results for input(s): "TSH", "T4TOTAL", "FREET4", "T3FREE", "THYROIDAB" in the last 72 hours. Lipid Profile: No results for input(s): "CHOL", "HDL", "LDLCALC", "TRIG", "CHOLHDL", "LDLDIRECT" in the last 72 hours. Anemia Panel: No results for input(s): "VITAMINB12", "FOLATE", "FERRITIN", "TIBC", "IRON", "RETICCTPCT" in the last 72 hours. Urine analysis:    Component Value Date/Time   COLORURINE YELLOW 05/04/2022 [place] 05/04/2022 1027   LABSPEC 1.020 05/04/2022 1027   PHURINE 5.0 05/04/2022 1027   GLUCOSEU NEGATIVE 05/04/2022 1027   HGBUR NEGATIVE 05/04/2022 1027   BILIRUBINUR NEGATIVE 05/04/2022 1027   BILIRUBINUR neg 04/24/2020 1520   KETONESUR 80 (A) 05/04/2022  1027   PROTEINUR NEGATIVE 05/04/2022 1027   UROBILINOGEN negative (A) 04/24/2020 1520   UROBILINOGEN 0.2 03/07/2015 1830   NITRITE NEGATIVE 05/04/2022 1027   LEUKOCYTESUR NEGATIVE 05/04/2022 1027   Sepsis Labs: Invalid input(s): "PROCALCITONIN", "LACTICIDVEN"  Microbiology: Recent Results (from the past 240 hour(s))  Blood Culture (routine x 2)     Status: None (Preliminary result)   Collection Time: 05/04/22  4:22 AM   Specimen: BLOOD  Result Value Ref Range Status   Specimen Description   Final    BLOOD RIGHT ANTECUBITAL Performed at [facility] [address] [place], [place] [address]    Special Requests   Final    BOTTLES DRAWN AEROBIC AND ANAEROBIC Blood Culture adequate volume Performed at [facility] [address] [place], [place] [address]    Culture   Final    NO GROWTH 1 DAY Performed at [facility], [place] [address], [place], [place] [address]    Report Status PENDING  Incomplete  Blood Culture (routine x 2)     Status: None (Preliminary result)   Collection Time: 05/04/22  4:35 AM   Specimen: BLOOD  Result Value Ref Range Status   Specimen Description   Final    BLOOD BLOOD RIGHT WRIST Performed at [facility] [address] [place], [place] [address]    Special Requests   Final    BOTTLES DRAWN AEROBIC AND ANAEROBIC Blood Culture adequate volume Performed at [facility] [address] [place], [place] [address]    Culture   Final    NO GROWTH 1 DAY Performed at [facility], [place] [address], [place], [place] [address]    Report Status PENDING  Incomplete  Urine Culture     Status: Abnormal   Collection Time: 05/04/22 10:27 AM   Specimen: In/Out Cath Urine  Result Value Ref Range Status   Specimen Description   Final    IN/OUT CATH URINE Performed at [facility] [address] [place], [place] [address]    Special Requests   Final     NONE Performed at [facility], [place] [address], [place], [place] [address]    Culture MULTIPLE SPECIES PRESENT, SUGGEST RECOLLECTION (A)  Final   Report Status 05/05/2022 FINAL  Final  Respiratory (~20 pathogens) panel by PCR     Status: None   Collection Time: 05/04/22  5:36 PM   Specimen: Nasopharyngeal Swab; Respiratory  Result Value Ref Range Status   Adenovirus NOT DETECTED NOT DETECTED Final   Coronavirus 229E NOT DETECTED NOT DETECTED Final    Comment: (NOTE) The Coronavirus on the Respiratory Panel, DOES NOT test for the novel  Coronavirus (2019 nCoV)    Coronavirus HKU1 NOT DETECTED NOT DETECTED Final   Coronavirus NL63 NOT DETECTED NOT DETECTED Final   Coronavirus OC43 NOT DETECTED NOT DETECTED Final   Metapneumovirus NOT DETECTED NOT DETECTED Final   Rhinovirus / Enterovirus NOT DETECTED NOT DETECTED Final   Influenza A NOT DETECTED NOT DETECTED Final   Influenza B NOT DETECTED NOT DETECTED Final   Parainfluenza Virus 1 NOT DETECTED NOT DETECTED Final   Parainfluenza Virus 2 NOT DETECTED NOT DETECTED Final   Parainfluenza Virus 3 NOT DETECTED NOT DETECTED Final   Parainfluenza Virus 4 NOT DETECTED NOT DETECTED Final   Respiratory Syncytial Virus NOT DETECTED NOT DETECTED Final   Bordetella pertussis NOT DETECTED NOT DETECTED Final   Bordetella Parapertussis NOT DETECTED NOT DETECTED Final   Chlamydophila pneumoniae NOT DETECTED NOT DETECTED Final   Mycoplasma pneumoniae NOT DETECTED NOT DETECTED Final    Comment: Performed at [facility], [place]. [address] [place], [place] [address]    Radiology Studies: No results found.    Acquanetta Cabanilla T[place]  If 7PM-7AM, please contact night-coverage [email or web address] 05/05/2022, 2:21 PM

## 2022-05-06 DIAGNOSIS — J9621 Acute and chronic respiratory failure with hypoxia: Secondary | ICD-10-CM | POA: Diagnosis not present

## 2022-05-06 DIAGNOSIS — I25118 Atherosclerotic heart disease of native coronary artery with other forms of angina pectoris: Secondary | ICD-10-CM | POA: Diagnosis not present

## 2022-05-06 DIAGNOSIS — E876 Hypokalemia: Secondary | ICD-10-CM | POA: Diagnosis not present

## 2022-05-06 DIAGNOSIS — J441 Chronic obstructive pulmonary disease with (acute) exacerbation: Secondary | ICD-10-CM | POA: Diagnosis not present

## 2022-05-06 LAB — RENAL FUNCTION PANEL
Albumin: 2.9 g/dL — ABNORMAL LOW (ref 3.5–5.0)
Anion gap: 8 (ref 5–15)
BUN: 15 mg/dL (ref 8–23)
CO2: 29 mmol/L (ref 22–32)
Calcium: 8.7 mg/dL — ABNORMAL LOW (ref 8.9–10.3)
Chloride: 102 mmol/L (ref 98–111)
Creatinine, Ser: 0.47 mg/dL (ref 0.44–1.00)
GFR, Estimated: >60 mL/min (ref >60)
Glucose, Bld: 122 mg/dL — ABNORMAL HIGH (ref 70–99)
Phosphorus: 2.4 mg/dL — ABNORMAL LOW (ref 2.5–4.6)
Potassium: 4.2 mmol/L (ref 3.5–5.1)
Sodium: 139 mmol/L (ref 135–145)

## 2022-05-06 LAB — CBC
HCT: 34.8 % — ABNORMAL LOW (ref 36.0–46.0)
Hemoglobin: 10.6 g/dL — ABNORMAL LOW (ref 12.0–15.0)
MCH: 28 pg (ref 26.0–34.0)
MCHC: 30.5 g/dL (ref 30.0–36.0)
MCV: 91.8 fL (ref 80.0–100.0)
Platelets: 355 K/uL (ref 150–400)
RBC: 3.79 MIL/uL — ABNORMAL LOW (ref 3.87–5.11)
RDW: 14.5 % (ref 11.5–15.5)
WBC: 14.1 K/uL — ABNORMAL HIGH (ref 4.0–10.5)
nRBC: 0 % (ref 0.0–0.2)

## 2022-05-06 LAB — MAGNESIUM: Magnesium: 2.4 mg/dL (ref 1.7–2.4)

## 2022-05-06 MED ORDER — LORAZEPAM 0.5 MG PO TABS
0.5000 mg | ORAL_TABLET | Freq: Once | ORAL | Status: AC
  Administered 2022-05-06: 0.5 mg via ORAL
  Filled 2022-05-06: qty 1

## 2022-05-06 MED ORDER — GUAIFENESIN-DM 100-10 MG/5ML PO SYRP
10.0000 mL | ORAL_SOLUTION | ORAL | Status: DC | PRN
  Administered 2022-05-06 – 2022-05-08 (×6): 10 mL via ORAL
  Filled 2022-05-06 (×6): qty 10

## 2022-05-06 MED ORDER — MONTELUKAST SODIUM 10 MG PO TABS
10.0000 mg | ORAL_TABLET | Freq: Every day | ORAL | Status: DC
  Administered 2022-05-06 – 2022-05-08 (×3): 10 mg via ORAL
  Filled 2022-05-06 (×4): qty 1

## 2022-05-06 MED ORDER — HYDROXYZINE HCL 10 MG PO TABS
10.0000 mg | ORAL_TABLET | Freq: Three times a day (TID) | ORAL | Status: DC | PRN
  Administered 2022-05-06 – 2022-05-07 (×2): 10 mg via ORAL
  Filled 2022-05-06 (×2): qty 1

## 2022-05-06 MED ORDER — METHYLPREDNISOLONE SODIUM SUCC 125 MG IJ SOLR
80.0000 mg | Freq: Two times a day (BID) | INTRAMUSCULAR | Status: DC
  Administered 2022-05-06 – 2022-05-07 (×3): 80 mg via INTRAVENOUS
  Filled 2022-05-06 (×3): qty 2

## 2022-05-06 MED ORDER — PANTOPRAZOLE SODIUM 40 MG PO TBEC
40.0000 mg | DELAYED_RELEASE_TABLET | Freq: Every day | ORAL | Status: DC
  Administered 2022-05-06 – 2022-05-07 (×2): 40 mg via ORAL
  Filled 2022-05-06 (×2): qty 1

## 2022-05-06 MED ORDER — BUDESONIDE 0.5 MG/2ML IN SUSP
0.5000 mg | Freq: Two times a day (BID) | RESPIRATORY_TRACT | Status: DC
  Administered 2022-05-06 – 2022-05-08 (×5): 0.5 mg via RESPIRATORY_TRACT
  Filled 2022-05-06 (×4): qty 2

## 2022-05-06 MED ORDER — IPRATROPIUM-ALBUTEROL 0.5-2.5 (3) MG/3ML IN SOLN: 3.0000 mL | RESPIRATORY_TRACT | Status: DC | PRN

## 2022-05-06 MED ORDER — LORAZEPAM 0.5 MG PO TABS: 0.5000 mg | ORAL_TABLET | Freq: Once | ORAL | Status: DC

## 2022-05-06 MED ORDER — LORAZEPAM 0.5 MG PO TABS
0.5000 mg | ORAL_TABLET | Freq: Three times a day (TID) | ORAL | Status: DC | PRN
  Administered 2022-05-07 (×2): 0.5 mg via ORAL
  Filled 2022-05-06 (×2): qty 1

## 2022-05-06 MED ORDER — REVEFENACIN 175 MCG/3ML IN SOLN
175.0000 ug | Freq: Every day | RESPIRATORY_TRACT | Status: DC
  Administered 2022-05-06 – 2022-05-08 (×3): 175 ug via RESPIRATORY_TRACT
  Filled 2022-05-06 (×3): qty 3

## 2022-05-06 MED ORDER — ARFORMOTEROL TARTRATE 15 MCG/2ML IN NEBU
15.0000 ug | INHALATION_SOLUTION | Freq: Two times a day (BID) | RESPIRATORY_TRACT | Status: DC
  Administered 2022-05-06 – 2022-05-08 (×5): 15 ug via RESPIRATORY_TRACT
  Filled 2022-05-06 (×7): qty 2

## 2022-05-06 NOTE — Progress Notes (Signed)
PHARMACIST - PHYSICIAN COMMUNICATION  DR: Cyndia Skeeters  CONCERNING: IV to Oral Route Change Policy  RECOMMENDATION: This patient is receiving Pantoprazole by the intravenous route.  Based on criteria approved by the Pharmacy and Therapeutics Committee, the intravenous medication(s) is/are being converted to the equivalent oral dose form(s).   DESCRIPTION: These criteria include: The patient is eating (either orally or via tube) and/or has been taking other orally administered medications for a least 24 hours The patient has no evidence of active gastrointestinal bleeding or impaired GI absorption (gastrectomy, short bowel, patient on TNA or NPO).  If you have questions about this conversion, please contact the Pharmacy Department  '[]'$   [phone] )  [name] '[]'$   [phone] )  [facility] '[]'$   [phone] )  [name] '[]'$   [phone] )  [facility] '[x]'$   [phone] )  [place], [place], [facility] 05/06/2022 11:41 AM

## 2022-05-06 NOTE — Progress Notes (Signed)
Per PT, who assisted pt with O2 & walk study "she is working VERY hard in terms of her breathing. Dropped to 87% on 6L just walking ~7 ft in the room.
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
## 2022-05-06 NOTE — Evaluation (Addendum)
Occupational Therapy Evaluation Patient Details Name: [name] MRN: [id number] DOB: 1960/07/30 Today's Date: 05/06/2022   History of Present Illness  (61 yr old female admitted with worsening SOB, productive cough. Pt now treated for acute on chronic respiratory failure w/ hypoxia, due to COPD exacerbation. PMH: COPD/chronic hypoxic respiratory failure on 5L, CAD, HLD, recent hospitalization for flu)   Clinical Impression   Patient is currently limited by increased work of breathing, increased heart rate with activity, compromised endurance, deconditioning, and decreased activity tolerance. She uses 5L O2 nearly around the clock at her baseline. OT educated her on implementing pursed lip breathing exercises as needed, use of a pulse oximeter to monitor O2 saturation and heart rate as she had no knowledge of how to do this, acceptable O2 saturation and heart rate ranges, and recommendation for use of a shower chair for energy conservation purposes in the home. Her O2 saturation was 94% on 5L at rest & heart rate was 100 bpm at rest. She will continue to benefit from further OT services to maximize her ADL performance and to decrease the risk for restricted participation in meaningful activities.      Recommendations for follow up therapy are one component of a multi-disciplinary discharge planning process, led by the attending physician.  Recommendations may be updated based on patient status, additional functional criteria and insurance authorization.   Follow Up Recommendations  Home health OT     Assistance Recommended at Discharge Intermittent Supervision/Assistance  Patient can return home with the following Assistance with cooking/housework;Assist for transportation;A little help with bathing/dressing/bathroom    Functional Status Assessment  Patient has had a recent decline in their functional status and demonstrates the ability to make significant improvements in function in a  reasonable and predictable amount of time.  Equipment Recommendations  Tub/shower seat; Pulse oximeter      Precautions / Restrictions Precautions Precautions: Fall Restrictions Weight Bearing Restrictions: No Other Position/Activity Restrictions: monitor O2 saturation, O2 dependent at baseline      Mobility Bed Mobility      General bed mobility comments: Pt was received seated in the bedside chair    Transfers Overall transfer level: Needs assistance Equipment used: None Transfers: Sit to/from Stand Sit to Stand: Supervision             Balance     Sitting balance-Leahy Scale: Good         Standing balance comment: SBA           ADL either performed or assessed with clinical judgement   ADL Overall ADL's : Needs assistance/impaired Eating/Feeding: Independent;Sitting Eating/Feeding Details (indicate cue type and reason): based on clinical judgement Grooming: Set up;Sitting           Upper Body Dressing : Set up;Sitting   Lower Body Dressing: Supervision/safety Lower Body Dressing Details (indicate cue type and reason): She doffed then donned her socks in sitting at chair level                      Pertinent Vitals/Pain Pain Assessment Pain Assessment: 0-10 Pain Score: 3  Pain Location: R side of abdomen 2/2 coughing Pain Intervention(s): Limited activity within patient's tolerance     Hand Dominance Right   Extremity/Trunk Assessment Upper Extremity Assessment Upper Extremity Assessment: Overall WFL for tasks assessed   Lower Extremity Assessment Lower Extremity Assessment: Overall WFL for tasks assessed       Communication Communication Communication: No difficulties   Cognition Arousal/Alertness: Awake/alert  Behavior During Therapy: WFL for tasks assessed/performed Overall Cognitive Status: Within Functional Limits for tasks assessed          General Comments: Oriented x4, able to follow commands without  difficulty                Home Living Family/patient expects to be discharged to:: Private residence Living Arrangements: Other (Comment) (6 yr old granddaughter) Available Help at Discharge: Family;Available PRN/intermittently Type of Home: Apartment Home Access: 1 stair     Home Layout: One level     Bathroom Shower/Tub: Tub/shower unit         Home Equipment: None   Additional Comments: uses 5L O2 around the clock      Prior Functioning/Environment Prior Level of Function : Independent/Modified Independent             Mobility Comments: She was independent with ambulation. ADLs Comments: She reported being modified independent to independent with ADLs and household chores. She drives.        OT Problem List: Decreased strength;Decreased activity tolerance;Decreased knowledge of use of DME or AE;Cardiopulmonary status limiting activity;Pain      OT Treatment/Interventions: Self-care/ADL training;Therapeutic exercise;Therapeutic activities;Energy conservation;Patient/family education;DME and/or AE instruction    OT Goals(Current goals can be found in the care plan section) Acute Rehab OT Goals Patient Stated Goal: improved breathing OT Goal Formulation: With patient Time For Goal Achievement: 05/20/22 Potential to Achieve Goals: Good ADL Goals Pt Will Perform Grooming: with modified independence;standing Pt Will Transfer to Toilet: with modified independence;ambulating Pt Will Perform Toileting - Clothing Manipulation and hygiene: with modified independence;sit to/from stand Additional ADL Goal #1: The patient will independently verbalize and/or implement at least 3 energy conservation strategies during ADLs, to facilitate improved overall activity tolerance.  OT Frequency: Min 2X/week       AM-PAC OT "6 Clicks" Daily Activity     Outcome Measure Help from another person eating meals?: None Help from another person taking care of personal grooming?:  None Help from another person toileting, which includes using toliet, bedpan, or urinal?: A Little Help from another person bathing (including washing, rinsing, drying)?: A Lot Help from another person to put on and taking off regular upper body clothing?: None Help from another person to put on and taking off regular lower body clothing?: A Little 6 Click Score: 20   End of Session Nurse Communication: Mobility status  Activity Tolerance: Other (comment) (Limited by decreased activity tolerance and increased work of breathing) Patient left: in chair;with call bell/phone within reach  OT Visit Diagnosis: Muscle weakness (generalized) (M62.81)                Time: 7829-5621 OT Time Calculation (min): 18 min Charges:  OT General Charges $OT Visit: 1 Visit OT Evaluation $OT Eval Moderate Complexity: 1 Mod    Gale Hulse L Hadli [place], OTR/L 05/06/2022, 12:36 PM

## 2022-05-06 NOTE — Progress Notes (Signed)
PROGRESS NOTE  [name] WNU:[id number] DOB: Mar 02, 1961   PCP: Benito Mccreedy, MD  Patient is from: Home.  Lives with granddaughter.  Reports ambulating independently at baseline.  DOA: 05/04/2022 LOS: 2  Chief complaints Chief Complaint  Patient presents with   Respiratory Distress     Brief Narrative / Interim history: 61 year old F with PMH of COPD/chronic hypoxic RF on 5 L, CAD, HLD, and recent hospitalization for influenza A returning with worsening shortness of breath, productive cough with whitish phlegm and admitted for acute on chronic respiratory failure with hypoxia in the setting of COPD exacerbation.  She tested positive for influenza A on 04/24/2022.  CXR with mild bibasilar atelectasis.  ABG, BMP and CBC without significant finding.  She was started on systemic steroid, nebulizers and BiPAP and admitted.  Slowly improving.  Subjective: Seen and examined earlier this morning.  No major events overnight of this morning.  Continues to endorse shortness of breath, wheezing, and cough.  She reports left-sided chest pain with cough.  Objective: Vitals:   05/06/22 0032 05/06/22 0443 05/06/22 0838 05/06/22 1119  BP: 96/75 100/72  103/70  Pulse: 86 82    Resp: 18 18  (!) 41  Temp: 97.8 F (36.6 C) 97.8 F (36.6 C)    TempSrc: Oral Oral    SpO2: 97% 96% 92% 96%  Height:        Examination  GENERAL: Appears anxious. HEENT: MMM.  Vision and hearing grossly intact.  NECK: Supple.  No apparent JVD.  RESP: Some work of breathing.  Wheezing.  Diminished aeration bilaterally. CVS:  RRR. Heart sounds normal.  ABD/GI/GU: BS+. Abd soft, NTND.  MSK/EXT:  Moves extremities. No apparent deformity. No edema.  SKIN: no apparent skin lesion or wound NEURO: Awake and alert. Oriented appropriately.  No apparent focal neuro deficit. PSYCH: Appears anxious.  Procedures:  None  Microbiology summarized: Full RVP nonreactive.  Blood cultures NGTD. Urine culture with  multiple species.  Assessment and plan: Principal Problem:   COPD with acute exacerbation ([place]) Active Problems:   GERD (gastroesophageal reflux disease)   CAD (coronary artery disease)   Hyperlipidemia   Essential hypertension   Acute on chronic respiratory failure with hypoxia ([facility])   Acute on chronic respiratory failure w/ hypoxia due to COPD exacerbation: Unclear cause of exacerbation.  Diagnosed with influenza A on 04/24/2022.  CXR with mild bibasilar atelectasis.  ABG reassuring.  Full RVP nonreactive.  Still with shortness of breath, work of breathing and wheezing. -Increase Solu-Medrol to 80 mg twice daily -Budesonide, Brovana and Yupelri. -Change DuoNebs to every 4 hours as needed. -Resume home Singulair. -Trial of p.o. Ativan 0.5 mg for anxiety. -BiPAP as needed  History of CAD/HLD: No anginal symptoms. -New home meds   Hypokalemia/hypophosphatemia -Monitor replenish as appropriate  Anxiety: Might be contributing to her breathing. -P.o. Ativan 0.5 mg x 1.  May continue 3 times daily if she tolerates. -P.o. Atarax 10 mg 3 times daily as needed   GERD -Protonix  Physical deconditioning: Reports ambulating independently at baseline. -PT/OT eval   Obesity Body mass index is 30.5 kg/m.          DVT prophylaxis:  enoxaparin (LOVENOX) injection 40 mg Start: 05/04/22 1700  Code Status: Full code Family Communication: None at bedside Level of care: Progressive Status is: Inpatient Remains inpatient appropriate because: Acute on chronic respiratory failure in the setting of COPD exacerbation   Final disposition: Likely home once medically cleared. Consultants:  None  Sch Meds:  Scheduled Meds:  arformoterol  15 mcg Nebulization BID   budesonide (PULMICORT) nebulizer solution  0.5 mg Nebulization BID   Chlorhexidine Gluconate Cloth  6 each Topical Daily   enoxaparin (LOVENOX) injection  40 mg Subcutaneous Q24H   guaiFENesin  600 mg Oral BID    methylPREDNISolone (SOLU-MEDROL) injection  80 mg Intravenous Q12H   montelukast  10 mg Oral Daily   mouth rinse  15 mL Mouth Rinse 4 times per day   pantoprazole  40 mg Oral Daily   revefenacin  175 mcg Nebulization Daily   Continuous Infusions: PRN Meds:.guaiFENesin-dextromethorphan, hydrOXYzine, ipratropium-albuterol, morphine injection, ondansetron **OR** ondansetron (ZOFRAN) IV, mouth rinse  Antimicrobials: Anti-infectives (From admission, onward)    None        I have personally reviewed the following labs and images: CBC: Recent Labs  Lab 05/04/22 0422 05/05/22 0256 05/06/22 0401  WBC 8.9 10.0 14.1*  NEUTROABS 6.9  --   --   HGB 11.6* 10.3* 10.6*  HCT 36.9 32.9* 34.8*  MCV 90.9 91.1 91.8  PLT 333 312 355   BMP &GFR Recent Labs  Lab 05/04/22 0422 05/05/22 0256 05/06/22 0401  NA 136 136 139  K 3.2* 4.4 4.2  CL 94* 101 102  CO2 '30 26 29  '$ GLUCOSE 116* 137* 122*  BUN 7* 11 15  CREATININE 0.70 0.57 0.47  CALCIUM 8.7* 8.4* 8.7*  MG 2.9*  --  2.4  PHOS  --   --  2.4*   Estimated Creatinine Clearance: 71.2 mL/min (by C-G formula based on SCr of 0.47 mg/dL). Liver & Pancreas: Recent Labs  Lab 05/04/22 0422 05/05/22 0256 05/06/22 0401  AST 20 13*  --   ALT 28 21  --   ALKPHOS 46 43  --   BILITOT 1.7* 0.9  --   PROT 7.1 6.7  --   ALBUMIN 3.4* 3.1* 2.9*   No results for input(s): "LIPASE", "AMYLASE" in the last 168 hours. No results for input(s): "AMMONIA" in the last 168 hours. Diabetic: No results for input(s): "HGBA1C" in the last 72 hours. No results for input(s): "GLUCAP" in the last 168 hours. Cardiac Enzymes: No results for input(s): "CKTOTAL", "CKMB", "CKMBINDEX", "TROPONINI" in the last 168 hours. No results for input(s): "PROBNP" in the last 8760 hours. Coagulation Profile: Recent Labs  Lab 05/04/22 0422  INR 1.1   Thyroid Function Tests: No results for input(s): "TSH", "T4TOTAL", "FREET4", "T3FREE", "THYROIDAB" in the last 72  hours. Lipid Profile: No results for input(s): "CHOL", "HDL", "LDLCALC", "TRIG", "CHOLHDL", "LDLDIRECT" in the last 72 hours. Anemia Panel: No results for input(s): "VITAMINB12", "FOLATE", "FERRITIN", "TIBC", "IRON", "RETICCTPCT" in the last 72 hours. Urine analysis:    Component Value Date/Time   COLORURINE YELLOW 05/04/2022 [place]inous 05/04/2022 1027   LABSPEC 1.020 05/04/2022 1027   PHURINE 5.0 05/04/2022 1027   GLUCOSEU NEGATIVE 05/04/2022 1027   HGBUR NEGATIVE 05/04/2022 1027   BILIRUBINUR NEGATIVE 05/04/2022 1027   BILIRUBINUR neg 04/24/2020 1520   KETONESUR 80 (A) 05/04/2022 1027   PROTEINUR NEGATIVE 05/04/2022 1027   UROBILINOGEN negative (A) 04/24/2020 1520   UROBILINOGEN 0.2 03/07/2015 1830   NITRITE NEGATIVE 05/04/2022 1027   LEUKOCYTESUR NEGATIVE 05/04/2022 1027   Sepsis Labs: Invalid input(s): "PROCALCITONIN", "LACTICIDVEN"  Microbiology: Recent Results (from the past 240 hour(s))  Blood Culture (routine x 2)     Status: None (Preliminary result)   Collection Time: 05/04/22  4:22 AM   Specimen: BLOOD  Result Value Ref Range Status  Specimen Description   Final    BLOOD RIGHT ANTECUBITAL Performed at [facility] [address], [place], [place] [address]    Special Requests   Final    BOTTLES DRAWN AEROBIC AND ANAEROBIC Blood Culture adequate volume Performed at [facility] [address], [place], [place] [address]    Culture   Final    NO GROWTH 2 DAYS Performed at [facility] [address] [place], [place] [address]    Report Status PENDING  Incomplete  Blood Culture (routine x 2)     Status: None (Preliminary result)   Collection Time: 05/04/22  4:35 AM   Specimen: BLOOD  Result Value Ref Range Status   Specimen Description   Final    BLOOD BLOOD RIGHT WRIST Performed at [facility] [address], [place], [place] [address]    Special Requests   Final     BOTTLES DRAWN AEROBIC AND ANAEROBIC Blood Culture adequate volume Performed at [facility] [address], [place], [place] [address]    Culture   Final    NO GROWTH 2 DAYS Performed at [facility] [address], [place], [place] [address]    Report Status PENDING  Incomplete  Urine Culture     Status: Abnormal   Collection Time: 05/04/22 10:27 AM   Specimen: In/Out Cath Urine  Result Value Ref Range Status   Specimen Description   Final    IN/OUT CATH URINE Performed at [facility] [address] [place], [place] [address]    Special Requests   Final    NONE Performed at [facility], [place] [address] [place], [place] [address]    Culture MULTIPLE SPECIES PRESENT, SUGGEST RECOLLECTION (A)  Final   Report Status 05/05/2022 FINAL  Final  Respiratory (~20 pathogens) panel by PCR     Status: None   Collection Time: 05/04/22  5:36 PM   Specimen: Nasopharyngeal Swab; Respiratory  Result Value Ref Range Status   Adenovirus NOT DETECTED NOT DETECTED Final   Coronavirus 229E NOT DETECTED NOT DETECTED Final    Comment: (NOTE) The Coronavirus on the Respiratory Panel, DOES NOT test for the novel  Coronavirus (2019 nCoV)    Coronavirus HKU1 NOT DETECTED NOT DETECTED Final   Coronavirus NL63 NOT DETECTED NOT DETECTED Final   Coronavirus OC43 NOT DETECTED NOT DETECTED Final   Metapneumovirus NOT DETECTED NOT DETECTED Final   Rhinovirus / Enterovirus NOT DETECTED NOT DETECTED Final   Influenza A NOT DETECTED NOT DETECTED Final   Influenza B NOT DETECTED NOT DETECTED Final   Parainfluenza Virus 1 NOT DETECTED NOT DETECTED Final   Parainfluenza Virus 2 NOT DETECTED NOT DETECTED Final   Parainfluenza Virus 3 NOT DETECTED NOT DETECTED Final   Parainfluenza Virus 4 NOT DETECTED NOT DETECTED Final   Respiratory Syncytial Virus NOT DETECTED NOT DETECTED Final   Bordetella pertussis NOT DETECTED NOT DETECTED Final    Bordetella Parapertussis NOT DETECTED NOT DETECTED Final   Chlamydophila pneumoniae NOT DETECTED NOT DETECTED Final   Mycoplasma pneumoniae NOT DETECTED NOT DETECTED Final    Comment: Performed at [facility], [place]. [address] [place], [place] [address]    Radiology Studies: No results found.    Afton Lavalle T. [place]  If 7PM-7AM, please contact night-coverage [email or web address] 05/06/2022, 12:02 PM

## 2022-05-06 NOTE — Evaluation (Signed)
Physical Therapy Evaluation Patient Details Name: [name] MRN: [id number] DOB: 25-May-1960 Today's Date: 05/06/2022  History of Present Illness  Pt is a 61 F admitted on 05/04/22 after presenting with c/o worsening SOB, productive cough. Pt is being treated for acute on chronic respiratory failure with hypoxia in the setting of COPD exacerbation. CXR with mild bibasiar atelectasis. PMH: COPD/chronic hypoxic RF on 5L, CAD, HLD, recent hospitalization for Influenza A  Clinical Impression  Pt seen for PT evaluation with pt agreeable. Pt reports prior to admission she was independent without AD, her 63 y/o granddaughter lives with her, pt denies falls, & notes she lives in a 1st floor apartment with 1 step from parking lot to apartment complex. On this date, pt's functional mobility is limited by work of breathing. Pt is able to complete step pivot without AD & supervision & take a few steps in room without AD & min assist. Educated pt on trialing RW for energy conservation (may even benefit from rollator) but will try during next session as minimal activity during this session was very effortful for pt. Will continue to follow pt acutely to address balance, strengthening, and activity tolerance. PT educated pt on recommendation of some assistance for household tasks upon d/c with pt noting her sister could possibly help some but pt also inquiring about personal care aide -- case manager made aware.  Pt on 5L/min at rest & during/after step pivot transfers, SpO2 >90% After gait on 6L/min SpO2 dropped to 87% Pt with significantly increased RR after gait, Max HR 129 bpm     Recommendations for follow up therapy are one component of a multi-disciplinary discharge planning process, led by the attending physician.  Recommendations may be updated based on patient status, additional functional criteria and insurance authorization.  Follow Up Recommendations Home health PT      Assistance Recommended  at Discharge Intermittent Supervision/Assistance  Patient can return home with the following  A little help with walking and/or transfers;A little help with bathing/dressing/bathroom;Assistance with cooking/housework;Assist for transportation;Help with stairs or ramp for entrance    Equipment Recommendations Rolling walker (2 wheels)  Recommendations for Other Services       Functional Status Assessment Patient has had a recent decline in their functional status and demonstrates the ability to make significant improvements in function in a reasonable and predictable amount of time.     Precautions / Restrictions Precautions Precautions: Fall Restrictions Weight Bearing Restrictions: No      Mobility  Bed Mobility Overal bed mobility: Modified Independent             General bed mobility comments: long sitting>sitting EOB without rails    Transfers Overall transfer level: Needs assistance Equipment used: None Transfers: Sit to/from Stand, Bed to chair/wheelchair/BSC Sit to Stand: Supervision   Step pivot transfers: Supervision (bed>BSC, BSC>recliner)            Ambulation/Gait Ambulation/Gait assistance: Min assist Gait Distance (Feet): 7 Feet Assistive device: None Gait Pattern/deviations: Decreased step length - left, Decreased step length - right, Decreased stride length Gait velocity: decreased        Stairs            Wheelchair Mobility    Modified Rankin (Stroke Patients Only)       Balance Overall balance assessment: Needs assistance Sitting-balance support: Feet unsupported, No upper extremity supported Sitting balance-Leahy Scale: Good     Standing balance support: During functional activity, No upper extremity supported Standing balance-Leahy Scale: Fair  Pertinent Vitals/Pain Pain Assessment Pain Assessment: Faces Faces Pain Scale: Hurts a little bit Pain Location: R side of abdomen  2/2 coughing Pain Descriptors / Indicators: Discomfort Pain Intervention(s): Monitored during session    Home Living Family/patient expects to be discharged to:: Private residence Living Arrangements: Other relatives (52 y/o granddaughter) Available Help at Discharge: Family;Available PRN/intermittently (sister lives nearby & can assist PRN) Type of Home: Apartment Home Access:  (1 step from parking lot to complex (??Curb))       Home Layout: One level Home Equipment: None      Prior Function Prior Level of Function : Independent/Modified Independent                     Hand Dominance        Extremity/Trunk Assessment   Upper Extremity Assessment Upper Extremity Assessment: Overall WFL for tasks assessed    Lower Extremity Assessment Lower Extremity Assessment: Generalized weakness       Communication   Communication: No difficulties  Cognition Arousal/Alertness: Awake/alert Behavior During Therapy: WFL for tasks assessed/performed Overall Cognitive Status: Within Functional Limits for tasks assessed                                          General Comments General comments (skin integrity, edema, etc.): Pt with continent void on BSC, manages mesh underwear without assistance to pull them off of hips.    Exercises     Assessment/Plan    PT Assessment Patient needs continued PT services  PT Problem List Decreased strength;Cardiopulmonary status limiting activity;Decreased activity tolerance;Decreased balance;Decreased mobility;Decreased safety awareness;Decreased knowledge of use of DME       PT Treatment Interventions DME instruction;Therapeutic exercise;Gait training;Balance training;Stair training;Neuromuscular re-education;Functional mobility training;Patient/family education;Therapeutic activities;Cognitive remediation    PT Goals (Current goals can be found in the Care Plan section)  Acute Rehab PT Goals Patient Stated Goal:  get better PT Goal Formulation: With patient Time For Goal Achievement: 05/20/22 Potential to Achieve Goals: Good    Frequency Min 3X/week     Co-evaluation               AM-PAC PT "6 Clicks" Mobility  Outcome Measure Help needed turning from your back to your side while in a flat bed without using bedrails?: None Help needed moving from lying on your back to sitting on the side of a flat bed without using bedrails?: None Help needed moving to and from a bed to a chair (including a wheelchair)?: A Little Help needed standing up from a chair using your arms (e.g., wheelchair or bedside chair)?: A Little Help needed to walk in hospital room?: A Little Help needed climbing 3-5 steps with a railing? : A Lot 6 Click Score: 19    End of Session Equipment Utilized During Treatment: Oxygen Activity Tolerance: Treatment limited secondary to medical complications (Comment) (limited by work of breathing) Patient left: in chair;with chair alarm set;with call bell/phone within reach Nurse Communication: Mobility status (O2) PT Visit Diagnosis: Muscle weakness (generalized) (M62.81);Difficulty in walking, not elsewhere classified (R26.2)    Time: 8850-2774 PT Time Calculation (min) (ACUTE ONLY): 21 min   Charges:   PT Evaluation $PT Eval Moderate Complexity: [place], PT, DPT 05/06/22, 11:19 AM   Waunita Schooner 05/06/2022, 11:16 AM

## 2022-05-07 DIAGNOSIS — J9621 Acute and chronic respiratory failure with hypoxia: Secondary | ICD-10-CM | POA: Diagnosis not present

## 2022-05-07 DIAGNOSIS — J441 Chronic obstructive pulmonary disease with (acute) exacerbation: Secondary | ICD-10-CM | POA: Diagnosis not present

## 2022-05-07 DIAGNOSIS — E7800 Pure hypercholesterolemia, unspecified: Secondary | ICD-10-CM | POA: Diagnosis not present

## 2022-05-07 DIAGNOSIS — E876 Hypokalemia: Secondary | ICD-10-CM | POA: Diagnosis not present

## 2022-05-07 LAB — CBC WITH DIFFERENTIAL/PLATELET
Abs Immature Granulocytes: 0.09 10*3/uL — ABNORMAL HIGH (ref 0.00–0.07)
Basophils Absolute: 0 10*3/uL (ref 0.0–0.1)
Basophils Relative: 0 %
Eosinophils Absolute: 0 10*3/uL (ref 0.0–0.5)
Eosinophils Relative: 0 %
HCT: 34.2 % — ABNORMAL LOW (ref 36.0–46.0)
Hemoglobin: 10.6 g/dL — ABNORMAL LOW (ref 12.0–15.0)
Immature Granulocytes: 1 %
Lymphocytes Relative: 3 %
Lymphs Abs: 0.4 10*3/uL — ABNORMAL LOW (ref 0.7–4.0)
MCH: 28.3 pg (ref 26.0–34.0)
MCHC: 31 g/dL (ref 30.0–36.0)
MCV: 91.2 fL (ref 80.0–100.0)
Monocytes Absolute: 0.2 10*3/uL (ref 0.1–1.0)
Monocytes Relative: 2 %
Neutro Abs: 12.2 10*3/uL — ABNORMAL HIGH (ref 1.7–7.7)
Neutrophils Relative %: 94 %
Platelets: 228 10*3/uL (ref 150–400)
RBC: 3.75 MIL/uL — ABNORMAL LOW (ref 3.87–5.11)
RDW: 14.4 % (ref 11.5–15.5)
WBC: 12.9 10*3/uL — ABNORMAL HIGH (ref 4.0–10.5)
nRBC: 0 % (ref 0.0–0.2)

## 2022-05-07 LAB — RENAL FUNCTION PANEL
Albumin: 2.8 g/dL — ABNORMAL LOW (ref 3.5–5.0)
Anion gap: 8 (ref 5–15)
BUN: 13 mg/dL (ref 8–23)
CO2: 29 mmol/L (ref 22–32)
Calcium: 9.1 mg/dL (ref 8.9–10.3)
Chloride: 103 mmol/L (ref 98–111)
Creatinine, Ser: 0.5 mg/dL (ref 0.44–1.00)
GFR, Estimated: >60 mL/min (ref >60)
Glucose, Bld: 144 mg/dL — ABNORMAL HIGH (ref 70–99)
Phosphorus: 3.4 mg/dL (ref 2.5–4.6)
Potassium: 4.5 mmol/L (ref 3.5–5.1)
Sodium: 140 mmol/L (ref 135–145)

## 2022-05-07 LAB — MAGNESIUM: Magnesium: 2.4 mg/dL (ref 1.7–2.4)

## 2022-05-07 LAB — TSH: TSH: 0.018 u[IU]/mL — ABNORMAL LOW (ref 0.350–4.500)

## 2022-05-07 MED ORDER — METHYLPREDNISOLONE SODIUM SUCC 40 MG IJ SOLR
40.0000 mg | Freq: Two times a day (BID) | INTRAMUSCULAR | Status: DC
  Administered 2022-05-07 – 2022-05-08 (×2): 40 mg via INTRAVENOUS
  Filled 2022-05-07 (×2): qty 1

## 2022-05-07 NOTE — Progress Notes (Signed)
Mobility Specialist - Progress Note  Pre-mobility: 96% SpO2 During mobility: 88% SpO2 Post-mobility: 93% SPO2   05/07/22 1212  Oxygen Therapy  O2 Device HFNC  O2 Flow Rate (L/min) 6 L/min  Patient Activity (if Appropriate) Ambulating  Mobility  Activity Ambulated with assistance in hallway  Level of Assistance Standby assist, set-up cues, supervision of patient - no hands on  Assistive Device Front wheel walker  Distance Ambulated (ft) 80 ft  Range of Motion/Exercises Active  Activity Response Tolerated well  Mobility Referral Yes  $Mobility charge 1 Mobility   Pt was found in bed and agreeable to ambulate. Had some wheezing and coughing prior to ambulation. During ambulation SPO2 dropped to 88% but would recover >90% within seconds. At EOS returned to recliner chair with necessities in reach.  Ferd Hibbs Mobility Specialist

## 2022-05-07 NOTE — Plan of Care (Signed)
  Problem: Coping: Goal: Level of anxiety will decrease Outcome: Progressing Note: Assist with lorazepam, also helps pt to relax enough to decrease labored breathing   Problem: Safety: Goal: Ability to remain free from injury will improve Outcome: Progressing   Problem: Skin Integrity: Goal: Risk for impaired skin integrity will decrease Outcome: Progressing Note: Instructed on self turns, pt is appropriate to do so

## 2022-05-07 NOTE — Progress Notes (Signed)
Bipap stand by pt has been doing well without it . Pt refused again tonight. Last worn couple days ago. Machine remained bedside if needed.

## 2022-05-07 NOTE — Progress Notes (Signed)
[facility]                                                                               [name], is a 61 y.o. female, DOB - 1961-03-31, KGU:[id number] Admit date - 05/04/2022    Outpatient Primary MD for the patient is Osei-Bonsu, Iona Beard, MD  LOS - 3  days    Brief summary   61 year old F with PMH of COPD/chronic hypoxic RF on 5 L, CAD, HLD, and recent hospitalization for influenza A returning with worsening shortness of breath, productive cough with whitish phlegm and admitted for acute on chronic respiratory failure with hypoxia in the setting of COPD exacerbation. She tested positive for influenza A on 04/24/2022. CXR with mild bibasilar atelectasis. ABG, BMP and CBC without significant finding. She was started on systemic steroid, nebulizers and BiPAP and admitted    Assessment & Plan    Assessment and Plan:  Acute on chronic respiratory failure with hypoxia secondary to COPD exacerbation Patient was initially diagnosed with influenza A infection.  Chest x-ray showed mild bibasilar atelectasis RVP is nonreactive Continue with steroids, DuoNebs and nasal cannula oxygen to keep sats greater than 90% she is currently on 5 L of nasal cannula oxygen.    Hyperlipidemia    Hypokalemia and hypophosphatemia Replaced   Anxiety Continue with Ativan and Atarax   Obesity.  Recommend outpatient follow-up with PCP    GERD Stable    Estimated body mass index is 30.5 kg/m as calculated from the following:   Height as of this encounter: '5\' 3"'$  (1.6 m).   Weight as of 04/25/22: 78.1 kg.  Code Status: full code.  DVT Prophylaxis:  enoxaparin (LOVENOX) injection 40 mg Start: 05/04/22 1700   Level of Care: Level of care: Progressive Family Communication: none at bedside.   Disposition Plan:     Remains inpatient appropriate:  IV steroids.   Procedures:  None.   Consultants:   None.   Antimicrobials:   Anti-infectives (From admission,  onward)    None        Medications  Scheduled Meds:  arformoterol  15 mcg Nebulization BID   budesonide (PULMICORT) nebulizer solution  0.5 mg Nebulization BID   Chlorhexidine Gluconate Cloth  6 each Topical Daily   enoxaparin (LOVENOX) injection  40 mg Subcutaneous Q24H   guaiFENesin  600 mg Oral BID   methylPREDNISolone (SOLU-MEDROL) injection  80 mg Intravenous Q12H   montelukast  10 mg Oral Daily   mouth rinse  15 mL Mouth Rinse 4 times per day   pantoprazole  40 mg Oral Daily   revefenacin  175 mcg Nebulization Daily   Continuous Infusions: PRN Meds:.guaiFENesin-dextromethorphan, hydrOXYzine, ipratropium-albuterol, LORazepam, morphine injection, ondansetron **OR** ondansetron (ZOFRAN) IV, mouth rinse    Subjective:   [name] was seen and examined today. Wheezing has improved, on 5 lit of [place] oxygen.   Objective:   Vitals:   05/06/22 1227 05/06/22 2020 05/07/22 0817 05/07/22 1416  BP: 95/67 105/72  119/76  Pulse: 97 95  75  Resp: 18 (!) 24 (!) 33 20  Temp: 98 F (36.7 C) 98 F (36.7 C)  97.9 F (36.6 C)  TempSrc: Oral Oral  Oral  SpO2: 95% 98% 100% 99%  Height:        Intake/Output Summary (Last 24 hours) at 05/07/2022 1553 Last data filed at 05/07/2022 1034 Gross per 24 hour  Intake 400 ml  Output 570 ml  Net -170 ml   There were no vitals filed for this visit.   Exam General: Alert and oriented x 3, NAD Cardiovascular: S1 S2 auscultated, no murmurs, RRR Respiratory: Clear to auscultation bilaterally, no wheezing, rales or rhonchi Gastrointestinal: Soft, nontender, nondistended, + bowel sounds Ext: no pedal edema bilaterally Neuro: AAOx3, Cr N's II- XII. Strength 5/5 upper and lower extremities bilaterally Skin: No rashes Psych: Normal affect and demeanor, alert and oriented x3    Data Reviewed:  I have personally reviewed following labs and imaging studies   CBC Lab Results  Component Value Date   WBC 12.9 (H) 05/07/2022   RBC  3.75 (L) 05/07/2022   HGB 10.6 (L) 05/07/2022   HCT 34.2 (L) 05/07/2022   MCV 91.2 05/07/2022   MCH 28.3 05/07/2022   PLT 228 05/07/2022   MCHC 31.0 05/07/2022   RDW 14.4 05/07/2022   LYMPHSABS 0.4 (L) 05/07/2022   MONOABS 0.2 05/07/2022   EOSABS 0.0 05/07/2022   BASOSABS 0.0 45/80/9983     Last metabolic panel Lab Results  Component Value Date   NA 140 05/07/2022   K 4.5 05/07/2022   CL 103 05/07/2022   CO2 29 05/07/2022   BUN 13 05/07/2022   CREATININE 0.50 05/07/2022   GLUCOSE 144 (H) 05/07/2022   GFRNONAA >60 05/07/2022   GFRAA 106 06/25/2020   CALCIUM 9.1 05/07/2022   PHOS 3.4 05/07/2022   PROT 6.7 05/05/2022   ALBUMIN 2.8 (L) 05/07/2022   LABGLOB 2.3 03/26/2018   AGRATIO 1.9 03/26/2018   BILITOT 0.9 05/05/2022   ALKPHOS 43 05/05/2022   AST 13 (L) 05/05/2022   ALT 21 05/05/2022   ANIONGAP 8 05/07/2022    CBG (last 3)  No results for input(s): "GLUCAP" in the last 72 hours.    Coagulation Profile: Recent Labs  Lab 05/04/22 0422  INR 1.1     Radiology Studies: No results found.     Hosie Poisson M.D. [facility] 05/07/2022, 3:53 PM  Available via Epic secure chat 7am-7pm After 7 pm, please refer to night coverage provider listed on amion.

## 2022-05-07 NOTE — TOC Progression Note (Signed)
Transition of Care [facility]) - Progression Note    Patient Details  Name: [name] MRN: [id number] Date of Birth: 12-09-1960  [facility] CM/SW [place], LCSW Phone Number: 05/07/2022, 11:11 AM  Clinical Narrative:    Received call from Caryl Pina from [place] he reported he will deliver pt's walker today. And will be following for pt's O2 needs. He reported if pt needs traveling tank will need new orders. TOC to follow.   Expected Discharge Plan: [place] Barriers to Discharge: Continued Medical Work up  Expected Discharge Plan and Services In-house Referral: Clinical Social Work     Living arrangements for the past 2 months: Single Family Home                 DME Arranged: Walker rolling DME Agency: [facility] Date DME Agency Contacted: 05/06/22 Time DME Agency Contacted: [id number] Representative spoke with at DME Agency: [place] Arranged: PT, OT, Nurse's Aide [place] Agency: [place] Date [place]: 05/06/22 Time [place]: 1527 Representative spoke with at [place]: [place] ([place]) Interventions [place]: Unknown (05/05/2022)  Housing: Low Risk  (05/05/2022)  Transportation Needs: No Transportation Needs (05/05/2022)  Utilities: Not At Risk (05/05/2022)  Financial Resource Strain: Low Risk  (04/10/2019)  Physical Activity: Inactive (04/10/2019)  Social Connections: Unknown (04/10/2019)  Stress: No Stress Concern Present (04/10/2019)  Tobacco Use: Medium Risk (05/05/2022)    Readmission Risk Interventions     No data to display

## 2022-05-08 ENCOUNTER — Other Ambulatory Visit: Payer: Self-pay | Admitting: Cardiology

## 2022-05-08 DIAGNOSIS — J9621 Acute and chronic respiratory failure with hypoxia: Secondary | ICD-10-CM | POA: Diagnosis not present

## 2022-05-08 DIAGNOSIS — I10 Essential (primary) hypertension: Secondary | ICD-10-CM | POA: Diagnosis not present

## 2022-05-08 DIAGNOSIS — Z789 Other specified health status: Secondary | ICD-10-CM

## 2022-05-08 DIAGNOSIS — J441 Chronic obstructive pulmonary disease with (acute) exacerbation: Secondary | ICD-10-CM | POA: Diagnosis not present

## 2022-05-08 DIAGNOSIS — K219 Gastro-esophageal reflux disease without esophagitis: Secondary | ICD-10-CM | POA: Diagnosis not present

## 2022-05-08 DIAGNOSIS — E7800 Pure hypercholesterolemia, unspecified: Secondary | ICD-10-CM

## 2022-05-08 DIAGNOSIS — I25118 Atherosclerotic heart disease of native coronary artery with other forms of angina pectoris: Secondary | ICD-10-CM

## 2022-05-08 DIAGNOSIS — I2510 Atherosclerotic heart disease of native coronary artery without angina pectoris: Secondary | ICD-10-CM

## 2022-05-08 DIAGNOSIS — T466X5A Adverse effect of antihyperlipidemic and antiarteriosclerotic drugs, initial encounter: Secondary | ICD-10-CM

## 2022-05-08 MED ORDER — HYDROXYZINE HCL 10 MG PO TABS: 10.0000 mg | ORAL_TABLET | Freq: Three times a day (TID) | ORAL | 0 refills | Status: DC | PRN

## 2022-05-08 MED ORDER — PANTOPRAZOLE SODIUM 40 MG PO TBEC: 40.0000 mg | DELAYED_RELEASE_TABLET | Freq: Every day | ORAL | 1 refills | Status: DC

## 2022-05-08 MED ORDER — REVEFENACIN 175 MCG/3ML IN SOLN: 175.0000 ug | Freq: Every day | RESPIRATORY_TRACT | 1 refills | Status: DC

## 2022-05-08 MED ORDER — PREDNISONE 10 MG PO TABS: ORAL_TABLET | ORAL | 0 refills | Status: DC

## 2022-05-08 MED ORDER — IPRATROPIUM-ALBUTEROL 0.5-2.5 (3) MG/3ML IN SOLN: 3.0000 mL | RESPIRATORY_TRACT | 3 refills | Status: DC | PRN

## 2022-05-08 MED ORDER — GUAIFENESIN-DM 100-10 MG/5ML PO SYRP: 10.0000 mL | ORAL_SOLUTION | ORAL | 0 refills | Status: DC | PRN

## 2022-05-08 NOTE — Progress Notes (Signed)
Removed oxygen from pt @ 5 L [place], pt dropped to 87% on room air rather quickly & experienced shortness of breath. Unable to ambulate pt without oxygen, due to decreasing oxygen sats, labored breathing, & weakness. O2 sats increased to 93% on RA, after 2 minutes of re-applying O2 5 L [place].

## 2022-05-08 NOTE — Discharge Summary (Signed)
Physician Discharge Summary   Patient: [name] MRN: [id number] DOB: 05/16/1961  Admit date:     05/04/2022  Discharge date: 05/08/22  Discharge Physician: Hosie Poisson   PCP: Benito Mccreedy, MD   Recommendations at discharge:  Follow up with PCP in one week.  Please follow up with pulmonology in one week.  Please check cbc and bmp in one week.   Discharge Diagnoses: Principal Problem:   COPD with acute exacerbation ([place]) Active Problems:   GERD (gastroesophageal reflux disease)   CAD (coronary artery disease)   Hyperlipidemia   Essential hypertension   Acute on chronic respiratory failure with hypoxia [facility])    Hospital Course:  61 year old F with PMH of COPD/chronic hypoxic RF on 5 L, CAD, HLD, and recent hospitalization for influenza A returning with worsening shortness of breath, productive cough with whitish phlegm and admitted for acute on chronic respiratory failure with hypoxia in the setting of COPD exacerbation. She tested positive for influenza A on 04/24/2022. CXR with mild bibasilar atelectasis. ABG, BMP and CBC without significant finding. She was started on systemic steroid, nebulizers and BiPAP and admitted    Assessment and Plan:  Acute on chronic respiratory failure with hypoxia secondary to COPD exacerbation Patient was initially diagnosed with influenza A infection.   Chest x-ray showed mild bibasilar atelectasis RVP is nonreactive Discharged on tapering steroids, DuoNebs and nasal cannula oxygen to keep sats greater than 90% she is currently on 5 L of nasal cannula oxygen.       Hyperlipidemia       Hypokalemia and hypophosphatemia Replaced     Anxiety Continue with Ativan and Atarax     Obesity.  Recommend outpatient follow-up with PCP       GERD Stable        Consultants: none. Procedures performed: none.   Disposition: Home Diet recommendation:  Discharge Diet Orders (From admission, onward)     Start     Ordered    05/08/22 0000  Diet - low sodium heart healthy        05/08/22 1045           Regular diet DISCHARGE MEDICATION: Allergies as of 05/08/2022       Reactions   Amoxicillin Shortness Of Breath   Tylenol [acetaminophen] Shortness Of Breath   Statins    Zetia [ezetimibe]         Medication List     STOP taking these medications    ibuprofen 200 MG tablet Commonly known as: ADVIL   Roflumilast 250 MCG Tabs Commonly known as: Daliresp   roflumilast 500 MCG Tabs tablet Commonly known as: DALIRESP       TAKE these medications    albuterol 108 (90 Base) MCG/ACT inhaler Commonly known as: VENTOLIN HFA Inhale 2 puffs into the lungs every 4 (four) hours as needed for wheezing or shortness of breath. What changed: Another medication with the same name was removed. Continue taking this medication, and follow the directions you see here.   famotidine 20 MG tablet Commonly known as: PEPCID Take 1 tablet (20 mg total) by mouth at bedtime.   guaiFENesin-dextromethorphan 100-10 MG/5ML syrup Commonly known as: ROBITUSSIN DM Take 10 mLs by mouth every 4 (four) hours as needed for cough.   hydrOXYzine 10 MG tablet Commonly known as: ATARAX Take 1 tablet (10 mg total) by mouth 3 (three) times daily as needed for anxiety.   ipratropium-albuterol 0.5-2.5 (3) MG/3ML Soln Commonly known as: DUONEB Take 3 mLs by  nebulization every 4 (four) hours as needed.   montelukast 10 MG tablet Commonly known as: SINGULAIR TAKE 1 TABLET BY MOUTH EVERYDAY AT BEDTIME What changed: See the new instructions.   nitroGLYCERIN 0.4 MG SL tablet Commonly known as: NITROSTAT Place 1 tablet (0.4 mg total) under the tongue every 5 (five) minutes as needed for chest pain.   OXYGEN Inhale 4 L into the lungs at bedtime.   pantoprazole 40 MG tablet Commonly known as: PROTONIX Take 1 tablet (40 mg total) by mouth daily.   predniSONE 10 MG tablet Commonly known as: DELTASONE Prednisone 40 mg  daily for 3 days followed by  Prednisone 30 mg daily for 3 days followed by  Prednisone 20 mg daily for 3 days followed by  Prednisone 10 mg daily as recommended by pulmonology What changed:  how much to take how to take this when to take this additional instructions   Repatha SureClick 500 MG/ML Soaj Generic drug: Evolocumab INJECT 140 MG INTO THE SKIN EVERY 14 (FOURTEEN) DAYS.   revefenacin 175 MCG/3ML nebulizer solution Commonly known as: YUPELRI Take 3 mLs (175 mcg total) by nebulization daily. Start taking on: May 09, 2022   Trelegy Ellipta 200-62.5-25 MCG/ACT Aepb Generic drug: Fluticasone-Umeclidin-Vilant Inhale 1 puff into the lungs daily.               Durable Medical Equipment  (From admission, onward)           Start     Ordered   05/08/22 1056  For home use only DME oxygen  Once       Question Answer Comment  Length of Need Lifetime   Mode or (Route) Nasal cannula   Liters per Minute 5   Frequency Continuous (stationary and portable oxygen unit needed)   Oxygen conserving device Yes   Oxygen delivery system Gas      05/08/22 1055   05/06/22 1120  For home use only DME Walker rolling  Once       Question Answer Comment  Walker: With [place] Wheels   Patient needs a walker to treat with the following condition General weakness      05/06/22 1120            Follow-up Information     Osei-Bonsu, Iona Beard, MD Follow up in 1 week(s).   Specialty: Internal Medicine Contact information: [address] [place] [address] [phone]                Discharge Exam: There were no vitals filed for this visit. General exam: Appears calm and comfortable  Respiratory system: Clear to auscultation. Respiratory effort normal. On 5 lit of [place] oxygen.  Cardiovascular system: S1 & S2 heard, RRR. No JVD,  Gastrointestinal system: Abdomen is nondistended, soft and nontender.  Central nervous system: Alert and oriented. No focal  neurological deficits. Extremities: Symmetric 5 x 5 power. Skin: No rashes, lesions or ulcers Psychiatry:  Mood & affect appropriate.    Condition at discharge: fair  The results of significant diagnostics from this hospitalization (including imaging, microbiology, ancillary and laboratory) are listed below for reference.   Imaging Studies: DG Chest Port 1 View  Result Date: 05/04/2022 CLINICAL DATA:  Low oxygen saturation, recent flu. EXAM: PORTABLE CHEST 1 VIEW COMPARISON:  April 24, 2022 FINDINGS: The heart size and mediastinal contours are within normal limits. Mildly decreased lung volumes are seen. Mild atelectasis is noted within the bilateral lung bases. There is no evidence of a  pleural effusion or pneumothorax. The visualized skeletal structures are unremarkable. IMPRESSION: Mild bibasilar atelectasis. Electronically Signed   By: Virgina Norfolk M.D.   On: 05/04/2022 04:44   DG Chest Port 1 View  Result Date: 04/24/2022 CLINICAL DATA:  Respiratory distress EXAM: PORTABLE CHEST 1 VIEW COMPARISON:  03/22/2022 FINDINGS: Cardiac shadow is prominent accentuated by the portable technique. The lungs are well aerated bilaterally. No focal infiltrate or sizable effusion is seen. No bony abnormality is noted. IMPRESSION: No active disease. Electronically Signed   By: Inez Catalina M.D.   On: 04/24/2022 01:11    Microbiology: Results for orders placed or performed during the hospital encounter of 05/04/22  Blood Culture (routine x 2)     Status: None (Preliminary result)   Collection Time: 05/04/22  4:22 AM   Specimen: BLOOD  Result Value Ref Range Status   Specimen Description   Final    BLOOD RIGHT ANTECUBITAL Performed at [facility] [address], [place], [place] [address]    Special Requests   Final    BOTTLES DRAWN AEROBIC AND ANAEROBIC Blood Culture adequate volume Performed at [facility] [address] [place], [place]  [address]    Culture   Final    NO GROWTH 4 DAYS Performed at [facility], [place] [address], [place], [place] [address]    Report Status PENDING  Incomplete  Blood Culture (routine x 2)     Status: None (Preliminary result)   Collection Time: 05/04/22  4:35 AM   Specimen: BLOOD  Result Value Ref Range Status   Specimen Description   Final    BLOOD BLOOD RIGHT WRIST Performed at [facility] [address], [place], [place] [address]    Special Requests   Final    BOTTLES DRAWN AEROBIC AND ANAEROBIC Blood Culture adequate volume Performed at [facility] [address], [place], [place] [address]    Culture   Final    NO GROWTH 4 DAYS Performed at [facility], [place] [address] [place], [place] [address]    Report Status PENDING  Incomplete  Urine Culture     Status: Abnormal   Collection Time: 05/04/22 10:27 AM   Specimen: In/Out Cath Urine  Result Value Ref Range Status   Specimen Description   Final    IN/OUT CATH URINE Performed at [facility] [address], [place], [place] [address]    Special Requests   Final    NONE Performed at [facility], [place] [address], [place], [place] [address]    Culture MULTIPLE SPECIES PRESENT, SUGGEST RECOLLECTION (A)  Final   Report Status 05/05/2022 FINAL  Final  Respiratory (~20 pathogens) panel by PCR     Status: None   Collection Time: 05/04/22  5:36 PM   Specimen: Nasopharyngeal Swab; Respiratory  Result Value Ref Range Status   Adenovirus NOT DETECTED NOT DETECTED Final   Coronavirus 229E NOT DETECTED NOT DETECTED Final    Comment: (NOTE) The Coronavirus on the Respiratory Panel, DOES NOT test for the novel  Coronavirus (2019 nCoV)    Coronavirus HKU1 NOT DETECTED NOT DETECTED Final   Coronavirus NL63 NOT DETECTED NOT DETECTED Final   Coronavirus OC43 NOT DETECTED NOT DETECTED Final   Metapneumovirus NOT DETECTED NOT DETECTED Final    Rhinovirus / Enterovirus NOT DETECTED NOT DETECTED Final   Influenza A NOT DETECTED NOT DETECTED Final   Influenza B NOT  DETECTED NOT DETECTED Final   Parainfluenza Virus 1 NOT DETECTED NOT DETECTED Final   Parainfluenza Virus 2 NOT DETECTED NOT DETECTED Final   Parainfluenza Virus 3 NOT DETECTED NOT DETECTED Final   Parainfluenza Virus 4 NOT DETECTED NOT DETECTED Final   Respiratory Syncytial Virus NOT DETECTED NOT DETECTED Final   Bordetella pertussis NOT DETECTED NOT DETECTED Final   Bordetella Parapertussis NOT DETECTED NOT DETECTED Final   Chlamydophila pneumoniae NOT DETECTED NOT DETECTED Final   Mycoplasma pneumoniae NOT DETECTED NOT DETECTED Final    Comment: Performed at [facility], [place] [address], [place], [place] [address]    Labs: CBC: Recent Labs  Lab 05/04/22 0422 05/05/22 0256 05/06/22 0401 05/07/22 0523  WBC 8.9 10.0 14.1* 12.9*  NEUTROABS 6.9  --   --  12.2*  HGB 11.6* 10.3* 10.6* 10.6*  HCT 36.9 32.9* 34.8* 34.2*  MCV 90.9 91.1 91.8 91.2  PLT 333 312 355 355   Basic Metabolic Panel: Recent Labs  Lab 05/04/22 0422 05/05/22 0256 05/06/22 0401 05/07/22 0523  NA 136 136 139 140  K 3.2* 4.4 4.2 4.5  CL 94* 101 102 103  CO2 '30 26 29 29  '$ GLUCOSE 116* 137* 122* 144*  BUN 7* '11 15 13  '$ CREATININE 0.70 0.57 0.47 0.50  CALCIUM 8.7* 8.4* 8.7* 9.1  MG 2.9*  --  2.4 2.4  PHOS  --   --  2.4* 3.4   Liver Function Tests: Recent Labs  Lab 05/04/22 0422 05/05/22 0256 05/06/22 0401 05/07/22 0523  AST 20 13*  --   --   ALT 28 21  --   --   ALKPHOS 46 43  --   --   BILITOT 1.7* 0.9  --   --   PROT 7.1 6.7  --   --   ALBUMIN 3.4* 3.1* 2.9* 2.8*   CBG: No results for input(s): "GLUCAP" in the last 168 hours.  Discharge time spent: 40 minutes.   Signed: Hosie Poisson, MD [facility] 05/08/2022

## 2022-05-08 NOTE — Progress Notes (Signed)
Discharge instructions provided to pt with son present. Pt & son verbalized understanding of information. [name] was delivered & sent with son, as was the Advertising account executive, which was also provided. Pt was transported to car via wheel chair without event.

## 2022-05-08 NOTE — TOC Transition Note (Signed)
Transition of Care [facility]) - CM/SW Discharge Note   Patient Details  Name: [name] MRN: [id number] Date of Birth: [birth year]  Transition of Care [facility]) CM/SW Contact:  Lennart Pall, LCSW Phone Number: 05/08/2022, 11:36 AM   Clinical Narrative:    Pt medically cleared for dc home today.  Pt reports that her son will bring in travel O2 from home.  RW has been delivered to room via [place].  HH already arranged with [facility].  No further TOC needs.   Final next level of care: Home w Home Health Services Barriers to Discharge: Barriers Resolved   Patient Goals and CMS Choice CMS Medicare.gov Compare Post Acute Care list provided to:: Patient Choice offered to / list presented to : Patient  Discharge Placement                         Discharge Plan and Services Additional resources added to the After Visit Summary for   In-house Referral: Clinical Social Work              DME Arranged: [place] Rile rolling DME Agency: [facility] Date DME Agency Contacted: 05/06/22 Time DME Agency Contacted: [phone] Representative spoke with at DME Agency: [place]: PT, OT, Nurse's Aide [place] Agency: [place] Date [place]: 05/06/22 Time [place]: [phone] Representative spoke with at [place]: [place] ([place]) Interventions [place]: Unknown (05/05/2022)  Housing: Low Risk  (05/05/2022)  Transportation Needs: No Transportation Needs (05/05/2022)  Utilities: Not At Risk (05/05/2022)  Financial Resource Strain: Low Risk  (04/10/2019)  Physical Activity: Inactive (04/10/2019)  Social Connections: Unknown (04/10/2019)  Stress: No Stress Concern Present (04/10/2019)  Tobacco Use: Medium Risk (05/05/2022)     Readmission Risk Interventions     No data to display

## 2022-05-08 NOTE — Plan of Care (Signed)
  Problem: Education: Goal: Knowledge of disease or condition will improve Outcome: Completed/Met Goal: Knowledge of the prescribed therapeutic regimen will improve Outcome: Completed/Met Goal: Individualized Educational Video(s) Outcome: Completed/Met   Problem: Activity: Goal: Ability to tolerate increased activity will improve Outcome: Completed/Met Goal: Will verbalize the importance of balancing activity with adequate rest periods Outcome: Completed/Met   Problem: Respiratory: Goal: Ability to maintain a clear airway will improve Outcome: Completed/Met Goal: Levels of oxygenation will improve Outcome: Completed/Met Goal: Ability to maintain adequate ventilation will improve Outcome: Completed/Met   Problem: Education: Goal: Knowledge of General Education information will improve Description: Including pain rating scale, medication(s)/side effects and non-pharmacologic comfort measures Outcome: Completed/Met   Problem: Health Behavior/Discharge Planning: Goal: Ability to manage health-related needs will improve Outcome: Completed/Met   Problem: Clinical Measurements: Goal: Ability to maintain clinical measurements within normal limits will improve Outcome: Completed/Met Goal: Will remain free from infection Outcome: Completed/Met Goal: Diagnostic test results will improve Outcome: Completed/Met Goal: Respiratory complications will improve Outcome: Completed/Met Goal: Cardiovascular complication will be avoided Outcome: Completed/Met   Problem: Activity: Goal: Risk for activity intolerance will decrease Outcome: Completed/Met   Problem: Nutrition: Goal: Adequate nutrition will be maintained Outcome: Completed/Met   Problem: Coping: Goal: Level of anxiety will decrease Outcome: Completed/Met   Problem: Elimination: Goal: Will not experience complications related to bowel motility Outcome: Completed/Met Goal: Will not experience complications related to  urinary retention Outcome: Completed/Met   Problem: Pain Managment: Goal: General experience of comfort will improve Outcome: Completed/Met   Problem: Safety: Goal: Ability to remain free from injury will improve Outcome: Completed/Met   Problem: Skin Integrity: Goal: Risk for impaired skin integrity will decrease Outcome: Completed/Met

## 2022-05-09 LAB — CULTURE, BLOOD (ROUTINE X 2)
Culture: NO GROWTH
Culture: NO GROWTH
Special Requests: ADEQUATE
Special Requests: ADEQUATE
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
## 2022-05-11 ENCOUNTER — Encounter: Payer: Self-pay | Admitting: Student

## 2022-05-11 ENCOUNTER — Ambulatory Visit (INDEPENDENT_AMBULATORY_CARE_PROVIDER_SITE_OTHER): Payer: BC Managed Care – PPO | Admitting: Student

## 2022-05-11 VITALS — BP 118/76 | HR 100 | Temp 98.1°F | Ht 63.0 in | Wt 171.8 lb

## 2022-05-11 DIAGNOSIS — J4489 Other specified chronic obstructive pulmonary disease: Secondary | ICD-10-CM | POA: Diagnosis not present

## 2022-05-11 DIAGNOSIS — J4550 Severe persistent asthma, uncomplicated: Secondary | ICD-10-CM | POA: Diagnosis not present

## 2022-05-11 MED ORDER — ARFORMOTEROL TARTRATE 15 MCG/2ML IN NEBU: 15.0000 ug | INHALATION_SOLUTION | Freq: Two times a day (BID) | RESPIRATORY_TRACT | 12 refills | Status: DC

## 2022-05-11 MED ORDER — ALBUTEROL SULFATE (2.5 MG/3ML) 0.083% IN NEBU: 2.5000 mg | INHALATION_SOLUTION | Freq: Four times a day (QID) | RESPIRATORY_TRACT | 11 refills | Status: DC | PRN

## 2022-05-11 NOTE — Patient Instructions (Addendum)
-   Finish steroid taper - mix brovana (formoterol) and yupelri (revefenacin) together in neb first thing in the morning. Do this combo neb. - THEN immediately after do budesonide neb. - In evening do brovana (formoterol) neb ALONE followed by budesonide neb - albuterol nebs or inhaler as needed - this is rescue medication. - STOP duonebs (albuterol -ipratropium) - we will work on getting you set up for dupixent for eosinophilic asthma/COPD

## 2022-05-12 ENCOUNTER — Telehealth: Payer: Self-pay | Admitting: Student

## 2022-05-12 ENCOUNTER — Telehealth: Payer: Self-pay | Admitting: Pulmonary Disease

## 2022-05-12 NOTE — Telephone Encounter (Signed)
Fax received from [place], form placed in Dr. Matilde Bash sign form.  He is currently out of the office, will have him sign when he returns to the office.

## 2022-05-12 NOTE — Telephone Encounter (Signed)
Called and spoke with patient, she said that she wants to turn in the [place] and get portable tanks.  She says the POC does not stay charged long enough so she would rather have the portable tanks.  Advised I had spoken with someone at [facility] and they are faxing over the form and we will complete it and send it back to them and take care of it for her.

## 2022-05-12 NOTE — Telephone Encounter (Signed)
Patient dropped off short term disability form at her appointment on 12/27.  I called patient and she said her first day out of work was 04/25/22 and she has not been back since then.  She would like to be able to return to work by 06/16/2022.  She is using her oxygen full time right now, but will not be able to use it at work. I filled out the form and sent to Dr. Verlee Monte to complete the plan of treatment and sign it.  Patient will need an appointment in late January so that she can get documentation that she is able to return to work.

## 2022-05-12 NOTE — Telephone Encounter (Signed)
Called [facility] and spoke with Melissa and she states that the patient has a POC and wants to switch back to tanks and give up the [place].    She said a form was faxed over to the office as they need a prescription to do so.  Will await fax to [phone].

## 2022-05-13 NOTE — Telephone Encounter (Signed)
Fax received from [place] [facility].  Form completed and faxed to [place].  Confirmation received.  Nothing further at this time.

## 2022-05-13 NOTE — Telephone Encounter (Signed)
Dr. Verlee Monte completed the disability form and signed it.  I have faxed it to [employer] - fax# [phone] and mailed a hard copy to the patient.   Emailed [place] to apply $29 processing fee

## 2022-05-17 ENCOUNTER — Telehealth: Payer: Self-pay | Admitting: Pulmonary Disease

## 2022-05-17 NOTE — Telephone Encounter (Signed)
PT calling regarding the last signed phone encounter. [facility] says she has to return one system before she can get the other.   I read all the notes to her from the the previous encounter but she is still not understanding. She states it is still an open issue because her portable is broke and will not stay charged.   Please call to advise. She has called [facility] and can not move forward with them without some sort of resolution from [place].   In short, ain issue is someone has to take the O2 tank back in order for her to get the other. The Dr. has to order that the tank has to go back. They got the other order.TY  [phone]

## 2022-05-17 NOTE — Telephone Encounter (Signed)
Spoke with pt who spoke with [facility] and they will be picking up POC tomorrow and leaving pt with tanks. Nothing further needed at this time.

## 2022-05-18 ENCOUNTER — Telehealth: Payer: Self-pay | Admitting: Pulmonary Disease

## 2022-05-18 NOTE — Telephone Encounter (Signed)
pt came by office due to disability papers being sent in  with incorrect date. states that it needs to show 04/24/22 from when pt was in hospital not date of original diagnoses. Also mentions  that the original paper needs to be given back to the patient, pt will come back to pick it up  pls advise [phone]

## 2022-05-18 NOTE — Telephone Encounter (Signed)
Dr. Verlee Monte signed the revised short term disability form - I called patient and she will pick it up at the [facility]. office on 05/19/22

## 2022-05-18 NOTE — Telephone Encounter (Signed)
Patient called to say the short term disability form needs to show her first day out of work was 04/24/2022 - the first time she was hospitalized.  Return to work date of 06/16/2022 does not need to be changed.  I have updated the form and sent it to Dr. Verlee Monte for signature.  Patient will come to the office to pick it up.

## 2022-05-19 ENCOUNTER — Telehealth: Payer: Self-pay | Admitting: Pulmonary Disease

## 2022-05-19 NOTE — Telephone Encounter (Signed)
Received a fax from [employer] to renew patient's FMLA.  I have completed the form and will have Dr. [name] sign it next week.  Showed start of FMLA at 06/16/2022 to indefinite.

## 2022-05-20 ENCOUNTER — Emergency Department (HOSPITAL_COMMUNITY): Payer: BC Managed Care – PPO

## 2022-05-20 ENCOUNTER — Encounter (HOSPITAL_COMMUNITY): Payer: Self-pay

## 2022-05-20 ENCOUNTER — Other Ambulatory Visit: Payer: Self-pay

## 2022-05-20 ENCOUNTER — Inpatient Hospital Stay (HOSPITAL_COMMUNITY)
Admission: EM | Admit: 2022-05-20 | Discharge: 2022-05-24 | DRG: 388 | Disposition: A | Discharge status: Home Health | Payer: BC Managed Care – PPO | Attending: Family Medicine | Admitting: Family Medicine

## 2022-05-20 DIAGNOSIS — Z9981 Dependence on supplemental oxygen: Secondary | ICD-10-CM | POA: Diagnosis not present

## 2022-05-20 DIAGNOSIS — K56609 Unspecified intestinal obstruction, unspecified as to partial versus complete obstruction: Secondary | ICD-10-CM

## 2022-05-20 DIAGNOSIS — Z825 Family history of asthma and other chronic lower respiratory diseases: Secondary | ICD-10-CM | POA: Diagnosis not present

## 2022-05-20 DIAGNOSIS — K219 Gastro-esophageal reflux disease without esophagitis: Secondary | ICD-10-CM | POA: Diagnosis present

## 2022-05-20 DIAGNOSIS — K565 Intestinal adhesions [bands], unspecified as to partial versus complete obstruction: Principal | ICD-10-CM | POA: Diagnosis present

## 2022-05-20 DIAGNOSIS — Z823 Family history of stroke: Secondary | ICD-10-CM | POA: Diagnosis not present

## 2022-05-20 DIAGNOSIS — E785 Hyperlipidemia, unspecified: Secondary | ICD-10-CM | POA: Diagnosis present

## 2022-05-20 DIAGNOSIS — J440 Chronic obstructive pulmonary disease with acute lower respiratory infection: Secondary | ICD-10-CM | POA: Diagnosis present

## 2022-05-20 DIAGNOSIS — J9611 Chronic respiratory failure with hypoxia: Secondary | ICD-10-CM | POA: Diagnosis present

## 2022-05-20 DIAGNOSIS — I2510 Atherosclerotic heart disease of native coronary artery without angina pectoris: Secondary | ICD-10-CM | POA: Diagnosis present

## 2022-05-20 DIAGNOSIS — Z79899 Other long term (current) drug therapy: Secondary | ICD-10-CM

## 2022-05-20 DIAGNOSIS — E669 Obesity, unspecified: Secondary | ICD-10-CM | POA: Diagnosis present

## 2022-05-20 DIAGNOSIS — Z87891 Personal history of nicotine dependence: Secondary | ICD-10-CM

## 2022-05-20 DIAGNOSIS — R197 Diarrhea, unspecified: Secondary | ICD-10-CM | POA: Diagnosis present

## 2022-05-20 DIAGNOSIS — Y95 Nosocomial condition: Secondary | ICD-10-CM | POA: Diagnosis present

## 2022-05-20 DIAGNOSIS — Z888 Allergy status to other drugs, medicaments and biological substances status: Secondary | ICD-10-CM | POA: Diagnosis not present

## 2022-05-20 DIAGNOSIS — I700 Atherosclerosis of aorta: Secondary | ICD-10-CM | POA: Diagnosis present

## 2022-05-20 DIAGNOSIS — J449 Chronic obstructive pulmonary disease, unspecified: Secondary | ICD-10-CM | POA: Diagnosis present

## 2022-05-20 DIAGNOSIS — Z880 Allergy status to penicillin: Secondary | ICD-10-CM | POA: Diagnosis not present

## 2022-05-20 DIAGNOSIS — J189 Pneumonia, unspecified organism: Secondary | ICD-10-CM | POA: Diagnosis present

## 2022-05-20 DIAGNOSIS — Z8616 Personal history of COVID-19: Secondary | ICD-10-CM | POA: Diagnosis present

## 2022-05-20 DIAGNOSIS — Z8249 Family history of ischemic heart disease and other diseases of the circulatory system: Secondary | ICD-10-CM

## 2022-05-20 DIAGNOSIS — J439 Emphysema, unspecified: Secondary | ICD-10-CM | POA: Diagnosis present

## 2022-05-20 DIAGNOSIS — R109 Unspecified abdominal pain: Principal | ICD-10-CM

## 2022-05-20 HISTORY — DX: Unspecified intestinal obstruction, unspecified as to partial versus complete obstruction: K56.609

## 2022-05-20 LAB — COMPREHENSIVE METABOLIC PANEL
ALT: 20 U/L (ref 0–44)
AST: 18 U/L (ref 15–41)
Albumin: 3.7 g/dL (ref 3.5–5.0)
Alkaline Phosphatase: 57 U/L (ref 38–126)
Anion gap: 10 (ref 5–15)
BUN: 10 mg/dL (ref 8–23)
CO2: 29 mmol/L (ref 22–32)
Calcium: 9.2 mg/dL (ref 8.9–10.3)
Chloride: 96 mmol/L — ABNORMAL LOW (ref 98–111)
Creatinine, Ser: 0.77 mg/dL (ref 0.44–1.00)
GFR, Estimated: >60 mL/min (ref >60)
Glucose, Bld: 113 mg/dL — ABNORMAL HIGH (ref 70–99)
Potassium: 3.5 mmol/L (ref 3.5–5.1)
Sodium: 135 mmol/L (ref 135–145)
Total Bilirubin: 0.8 mg/dL (ref 0.3–1.2)
Total Protein: 8.1 g/dL (ref 6.5–8.1)

## 2022-05-20 LAB — RESP PANEL BY RT-PCR (RSV, FLU A&B, COVID)  RVPGX2
Influenza A by PCR: NEGATIVE
Influenza B by PCR: NEGATIVE
Resp Syncytial Virus by PCR: NEGATIVE
SARS Coronavirus 2 by RT PCR: NEGATIVE

## 2022-05-20 LAB — CBC
HCT: 40.2 % (ref 36.0–46.0)
Hemoglobin: 12.4 g/dL (ref 12.0–15.0)
MCH: 27.7 pg (ref 26.0–34.0)
MCHC: 30.8 g/dL (ref 30.0–36.0)
MCV: 89.7 fL (ref 80.0–100.0)
Platelets: 330 10*3/uL (ref 150–400)
RBC: 4.48 MIL/uL (ref 3.87–5.11)
RDW: 15.2 % (ref 11.5–15.5)
WBC: 10.4 10*3/uL (ref 4.0–10.5)
nRBC: 0 % (ref 0.0–0.2)

## 2022-05-20 LAB — LACTIC ACID, PLASMA
Lactic Acid, Venous: 0.7 mmol/L (ref 0.5–1.9)
Lactic Acid, Venous: 0.9 mmol/L (ref 0.5–1.9)

## 2022-05-20 LAB — TROPONIN I (HIGH SENSITIVITY)
Troponin I (High Sensitivity): 4 ng/L (ref <18)
Troponin I (High Sensitivity): 3 ng/L (ref <18)

## 2022-05-20 LAB — LIPASE, BLOOD: Lipase: 26 U/L (ref 11–51)

## 2022-05-20 MED ORDER — ONDANSETRON HCL 4 MG/2ML IJ SOLN
4.0000 mg | Freq: Four times a day (QID) | INTRAMUSCULAR | Status: DC | PRN
  Administered 2022-05-20 – 2022-05-21 (×2): 4 mg via INTRAVENOUS
  Filled 2022-05-20 (×2): qty 2

## 2022-05-20 MED ORDER — IPRATROPIUM-ALBUTEROL 0.5-2.5 (3) MG/3ML IN SOLN
3.0000 mL | Freq: Three times a day (TID) | RESPIRATORY_TRACT | Status: DC
  Administered 2022-05-21 – 2022-05-24 (×9): 3 mL via RESPIRATORY_TRACT
  Filled 2022-05-20 (×12): qty 3

## 2022-05-20 MED ORDER — MENTHOL 3 MG MT LOZG: 1.0000 | LOZENGE | OROMUCOSAL | Status: DC | PRN

## 2022-05-20 MED ORDER — MORPHINE SULFATE (PF) 2 MG/ML IV SOLN
2.0000 mg | INTRAVENOUS | Status: DC | PRN
  Administered 2022-05-21: 2 mg via INTRAVENOUS
  Filled 2022-05-20 (×2): qty 1

## 2022-05-20 MED ORDER — ALUM & MAG HYDROXIDE-SIMETH 200-200-20 MG/5ML PO SUSP: 30.0000 mL | Freq: Four times a day (QID) | ORAL | Status: DC | PRN

## 2022-05-20 MED ORDER — HYDROMORPHONE HCL 1 MG/ML IJ SOLN
0.5000 mg | INTRAMUSCULAR | Status: DC | PRN
  Administered 2022-05-20: 0.5 mg via INTRAVENOUS
  Filled 2022-05-20: qty 1

## 2022-05-20 MED ORDER — PROCHLORPERAZINE EDISYLATE 10 MG/2ML IJ SOLN
5.0000 mg | INTRAMUSCULAR | Status: DC | PRN
  Administered 2022-05-20 – 2022-05-22 (×3): 10 mg via INTRAVENOUS
  Filled 2022-05-20 (×3): qty 2

## 2022-05-20 MED ORDER — IPRATROPIUM-ALBUTEROL 0.5-2.5 (3) MG/3ML IN SOLN
3.0000 mL | Freq: Once | RESPIRATORY_TRACT | Status: AC
  Administered 2022-05-20: 3 mL via RESPIRATORY_TRACT
  Filled 2022-05-20: qty 3

## 2022-05-20 MED ORDER — PHENOL 1.4 % MT LIQD: 2.0000 | OROMUCOSAL | Status: DC | PRN

## 2022-05-20 MED ORDER — SODIUM CHLORIDE (PF) 0.9 % IJ SOLN
INTRAMUSCULAR | Status: AC
  Filled 2022-05-20: qty 50

## 2022-05-20 MED ORDER — HYDROMORPHONE HCL 1 MG/ML IJ SOLN
1.0000 mg | Freq: Once | INTRAMUSCULAR | Status: AC
  Administered 2022-05-20: 1 mg via INTRAVENOUS
  Filled 2022-05-20: qty 1

## 2022-05-20 MED ORDER — SODIUM CHLORIDE 0.9 % IV SOLN
2.0000 g | Freq: Once | INTRAVENOUS | Status: AC
  Administered 2022-05-20: 2 g via INTRAVENOUS
  Filled 2022-05-20: qty 20

## 2022-05-20 MED ORDER — SODIUM CHLORIDE 0.9 % IV SOLN
500.0000 mg | Freq: Once | INTRAVENOUS | Status: AC
  Administered 2022-05-20: 500 mg via INTRAVENOUS
  Filled 2022-05-20: qty 5

## 2022-05-20 MED ORDER — LACTATED RINGERS IV BOLUS: 1000.0000 mL | Freq: Three times a day (TID) | INTRAVENOUS | Status: DC | PRN

## 2022-05-20 MED ORDER — IPRATROPIUM-ALBUTEROL 0.5-2.5 (3) MG/3ML IN SOLN
3.0000 mL | Freq: Four times a day (QID) | RESPIRATORY_TRACT | Status: DC
  Administered 2022-05-20: 3 mL via RESPIRATORY_TRACT
  Filled 2022-05-20: qty 3

## 2022-05-20 MED ORDER — ALBUTEROL SULFATE (2.5 MG/3ML) 0.083% IN NEBU
2.5000 mg | INHALATION_SOLUTION | RESPIRATORY_TRACT | Status: DC | PRN
  Administered 2022-05-22: 2.5 mg via RESPIRATORY_TRACT
  Filled 2022-05-20 (×2): qty 3

## 2022-05-20 MED ORDER — SODIUM CHLORIDE 0.9 % IV SOLN
8.0000 mg | Freq: Four times a day (QID) | INTRAVENOUS | Status: DC | PRN
  Filled 2022-05-20: qty 4

## 2022-05-20 MED ORDER — BISACODYL 10 MG RE SUPP
10.0000 mg | Freq: Every day | RECTAL | Status: DC
  Administered 2022-05-20 – 2022-05-24 (×5): 10 mg via RECTAL
  Filled 2022-05-20 (×6): qty 1

## 2022-05-20 MED ORDER — VANCOMYCIN HCL IN DEXTROSE 1-5 GM/200ML-% IV SOLN
1000.0000 mg | INTRAVENOUS | Status: DC
  Administered 2022-05-21: 1000 mg via INTRAVENOUS
  Filled 2022-05-20: qty 200

## 2022-05-20 MED ORDER — ONDANSETRON 4 MG PO TBDP
4.0000 mg | ORAL_TABLET | Freq: Once | ORAL | Status: AC | PRN
  Administered 2022-05-20: 4 mg via ORAL
  Filled 2022-05-20: qty 1

## 2022-05-20 MED ORDER — IOHEXOL 350 MG/ML SOLN
100.0000 mL | Freq: Once | INTRAVENOUS | Status: AC | PRN
  Administered 2022-05-20: 100 mL via INTRAVENOUS

## 2022-05-20 MED ORDER — PANTOPRAZOLE SODIUM 40 MG IV SOLR
40.0000 mg | INTRAVENOUS | Status: DC
  Administered 2022-05-20 – 2022-05-23 (×4): 40 mg via INTRAVENOUS
  Filled 2022-05-20 (×4): qty 10

## 2022-05-20 MED ORDER — VANCOMYCIN HCL 1500 MG/300ML IV SOLN
1500.0000 mg | Freq: Once | INTRAVENOUS | Status: AC
  Administered 2022-05-20: 1500 mg via INTRAVENOUS
  Filled 2022-05-20: qty 300

## 2022-05-20 MED ORDER — SODIUM CHLORIDE 0.9 % IV SOLN
2.0000 g | Freq: Three times a day (TID) | INTRAVENOUS | Status: DC
  Administered 2022-05-20 – 2022-05-21 (×3): 2 g via INTRAVENOUS
  Filled 2022-05-20 (×3): qty 12.5

## 2022-05-20 MED ORDER — MAGIC MOUTHWASH: 15.0000 mL | Freq: Four times a day (QID) | ORAL | Status: DC | PRN

## 2022-05-20 MED ORDER — FENTANYL CITRATE PF 50 MCG/ML IJ SOSY
25.0000 ug | PREFILLED_SYRINGE | Freq: Once | INTRAMUSCULAR | Status: AC
  Administered 2022-05-20: 25 ug via INTRAVENOUS
  Filled 2022-05-20: qty 1

## 2022-05-20 MED ORDER — LIP MEDEX EX OINT
TOPICAL_OINTMENT | Freq: Two times a day (BID) | CUTANEOUS | Status: DC
  Administered 2022-05-23: 1 via TOPICAL
  Administered 2022-05-23: 75 via TOPICAL
  Filled 2022-05-20 (×3): qty 7

## 2022-05-20 MED ORDER — DIATRIZOATE MEGLUMINE & SODIUM 66-10 % PO SOLN: 90.0000 mL | Freq: Once | ORAL | Status: DC

## 2022-05-20 NOTE — Progress Notes (Signed)
A consult was received from an ED physician for vancomycin per pharmacy dosing.  The patient's profile has been reviewed for ht/wt/allergies/indication/available labs.   A one time order has been placed for vancomycin '1500mg'$ .  Further antibiotics/pharmacy consults should be ordered by admitting physician if indicated.                       Thank you, Peggyann Juba, PharmD, BCPS 05/20/2022  1:47 PM

## 2022-05-20 NOTE — ED Notes (Addendum)
Attempted NG tube at bedside x2 with no success. Pt experiencing a lot of nausea. MD notified, will notify surgical team.

## 2022-05-20 NOTE — ED Triage Notes (Addendum)
Patient c/o  mid abdominal pain, N/V/D and chills since 0200 today.  Patient reports that she has COPD and wears O2 $L/min via [place] at home. Patient arrived with no O2 on. Sats in triage 88% on room air
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
## 2022-05-20 NOTE — Progress Notes (Signed)
Pharmacy Antibiotic Note  [name] is a 62 y.o. female admitted on 05/20/2022 with pneumonia.  Pharmacy has been consulted for vancomycin and cefepime dosing.  Plan: Cefepime 2g IV q8h Vancomycin '1500mg'$  IV x 1 then 1g IV q24h Check vancomycin levels at steady state, goal AUC 400-550 Follow up renal function & cultures  Height: '5\' 3"'$  (160 cm) Weight: 78 kg (172 lb) IBW/kg (Calculated) : 52.4  Temp (24hrs), Avg:98 F (36.7 C), Min:97.7 F (36.5 C), Max:98.3 F (36.8 C)  Recent Labs  Lab 05/20/22 0853 05/20/22 1211  WBC 10.4  --   CREATININE 0.77  --   LATICACIDVEN  --  0.7    Estimated Creatinine Clearance: 71.1 mL/min (by C-G formula based on SCr of 0.77 mg/dL).    Allergies  Allergen Reactions   Amoxicillin Shortness Of Breath   Tylenol [Acetaminophen] Shortness Of Breath   Statins    Zetia [Ezetimibe]     Antimicrobials this admission: 1/5 CTX/azith x 1 1/5 Cefepime >> 1/5 Vancomycin >>  Dose adjustments this admission:  Microbiology results: 1/5 BCx: 1/5 MRSA PCR:  Thank you for allowing pharmacy to be a part of this patient's care.  Peggyann Juba, PharmD, BCPS Pharmacy: [phone] 05/20/2022 2:12 PM
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
## 2022-05-20 NOTE — ED Provider Notes (Signed)
[facility] DEPT Provider Note   CSN: [id number] Arrival date & time: 05/20/22  0840     History  Chief Complaint  Patient presents with   Abdominal Pain   Chills   Emesis   Shortness of Breath    [name] is a 62 y.o. female. With pmh COPD/chronic hypoxic RF on 5 L, CAD, HLD, recent COPD exacerbation admissions in setting of Influenza infection presenting with complaints of stabbing abdominal pain that woke her up around 2 AM last night.  Patient says around 2 AM she woke up with severe stabbing pain in her upper epigastrium region as well as her mid abdominal region and pain in her left chest wall.  Is not worse with breathing.  She has not taken any medication for the pain and has had no appetite and not eating or drinking much today.  She has had associated nonbloody nonbilious emesis and nonbloody diarrhea.  She is complaining of no chest pain or shortness of breath at this time. Denies urinary symptoms.  She feels like her COPD is doing better than usual and just finished steroids recently.  She has had no associated increased cough from usual, productive cough, congestion or rhinorrhea.  No fevers at all.  She does have history of mesh placement for hernia in her abdomen.  She has never had pain like this before.   Abdominal Pain Associated symptoms: shortness of breath and vomiting   Emesis Associated symptoms: abdominal pain   Shortness of Breath Associated symptoms: abdominal pain and vomiting        Home Medications Prior to Admission medications   Medication Sig Start Date End Date Taking? Authorizing Provider  albuterol (PROVENTIL) (2.5 MG/3ML) 0.083% nebulizer solution Take 3 mLs (2.5 mg total) by nebulization every 6 (six) hours as needed for wheezing or shortness of breath. 05/11/22   Maryjane Hurter, MD  albuterol (VENTOLIN HFA) 108 (90 Base) MCG/ACT inhaler Inhale 2 puffs into the lungs every 4 (four) hours as needed for  wheezing or shortness of breath. 04/25/22   Dwyane Dee, MD  arformoterol (BROVANA) 15 MCG/2ML NEBU Take 2 mLs (15 mcg total) by nebulization 2 (two) times daily. 05/11/22   Maryjane Hurter, MD  Evolocumab (REPATHA SURECLICK) 546 MG/ML SOAJ Inject 140 mg into the skin every 14 (fourteen) days. 05/10/22   Richardo Priest, MD  famotidine (PEPCID) 20 MG tablet Take 1 tablet (20 mg total) by mouth at bedtime. 01/04/22   Richardo Priest, MD  guaiFENesin-dextromethorphan (ROBITUSSIN DM) 100-10 MG/5ML syrup Take 10 mLs by mouth every 4 (four) hours as needed for cough. 05/08/22   Hosie Poisson, MD  hydrOXYzine (ATARAX) 10 MG tablet Take 1 tablet (10 mg total) by mouth 3 (three) times daily as needed for anxiety. 05/08/22   Hosie Poisson, MD  montelukast (SINGULAIR) 10 MG tablet TAKE 1 TABLET BY MOUTH EVERYDAY AT BEDTIME Patient taking differently: Take 10 mg by mouth at bedtime. 03/08/22   Mannam, Hart Robinsons, MD  nitroGLYCERIN (NITROSTAT) 0.4 MG SL tablet Place 1 tablet (0.4 mg total) under the tongue every 5 (five) minutes as needed for chest pain. 06/21/21   Richardo Priest, MD  OXYGEN Inhale 4 L into the lungs at bedtime.    [provider]  pantoprazole (PROTONIX) 40 MG tablet Take 1 tablet (40 mg total) by mouth daily. 05/08/22   Hosie Poisson, MD  predniSONE (DELTASONE) 10 MG tablet Prednisone 40 mg daily for 3 days followed by  Prednisone 30 mg daily for 3 days followed by  Prednisone 20 mg daily for 3 days followed by  Prednisone 10 mg daily as recommended by pulmonology 05/08/22   Hosie Poisson, MD  revefenacin (YUPELRI) 175 MCG/3ML nebulizer solution Take 3 mLs (175 mcg total) by nebulization daily. Patient not taking: Reported on 05/11/2022 05/09/22   Hosie Poisson, MD      Allergies    Amoxicillin, Tylenol [acetaminophen], Statins, and Zetia [ezetimibe]    Review of Systems   Review of Systems  Respiratory:  Positive for shortness of breath.   Gastrointestinal:  Positive for  abdominal pain and vomiting.    Physical Exam Updated Vital Signs BP 115/84 (BP Location: Left Arm)   Pulse 87   Temp 97.7 F (36.5 C) (Oral)   Resp 20   Ht '5\' 3"'$  (1.6 m)   Wt 78 kg   SpO2 96%   BMI 30.47 kg/m  Physical Exam Constitutional: Alert and oriented. Chronically ill in appearance and uncomfortable writing in bed Eyes: Conjunctivae are normal. ENT      Head: Normocephalic and atraumatic.      Nose: No congestion.      Mouth/Throat: Mucous membranes are moist.      Neck: No stridor. Cardiovascular: S1, S2,  tachycardic, regular rhythm, decreased RLE PT pulse and DP pulse but still palpable and palpable b/l radial pulses Respiratory: Mildly tachypneic, faint end expiratory wheeze bilaterally but good aeration throughout, O2 sat 88% on room air however 95% on home 4 L nasal cannula Gastrointestinal: Soft and nondistended with diffuse tenderness worse in the epigastrium and umbilicus region with voluntary guarding Musculoskeletal: Normal range of motion in all extremities. No edema of b/l LEs Neurologic: Normal speech and language. Moving extremities x4. No gross focal neurologic deficits are appreciated. Skin: Skin is warm, dry and intact. No rash noted. Psychiatric: Mood and affect are normal. Speech and behavior are normal.  ED Results / Procedures / Treatments   Labs (all labs ordered are listed, but only abnormal results are displayed) Labs Reviewed  COMPREHENSIVE METABOLIC PANEL - Abnormal; Notable for the following components:      Result Value   Chloride 96 (*)    Glucose, Bld 113 (*)    All other components within normal limits  RESP PANEL BY RT-PCR (RSV, FLU A&B, COVID)  [id number]  CULTURE, BLOOD (ROUTINE X 2)  CULTURE, BLOOD (ROUTINE X 2)  LIPASE, BLOOD  CBC  LACTIC ACID, PLASMA  URINALYSIS, ROUTINE W REFLEX MICROSCOPIC  LACTIC ACID, PLASMA  TROPONIN I (HIGH SENSITIVITY)  TROPONIN I (HIGH SENSITIVITY)    EKG EKG Interpretation  Date/Time:  Friday  May 20 2022 10:05:22 EST Ventricular Rate:  89 PR Interval:  128 QRS Duration: 80 QT Interval:  371 QTC Calculation: 452 R Axis:   80 Text Interpretation: Sinus rhythm Probable left atrial enlargement Nonspecific T abnrm, anterolateral leads No significant change since last tracing Confirmed by Georgina Snell [phone]) on 05/20/2022 10:21:50 AM  Radiology CT Angio Chest/Abd/Pel for Dissection W and/or Wo Contrast  Result Date: 05/20/2022 CLINICAL DATA:  Left chest pain, upper abdominal stabbing pain, nausea, vomiting, chills EXAM: CT ANGIOGRAPHY CHEST, ABDOMEN AND PELVIS TECHNIQUE: Non-contrast CT of the chest was initially obtained. Multidetector CT imaging through the chest, abdomen and pelvis was performed using the standard protocol during bolus administration of intravenous contrast. Multiplanar reconstructed images and MIPs were obtained and reviewed to evaluate the vascular anatomy. RADIATION DOSE REDUCTION: This exam was performed according to the departmental  dose-optimization program which includes automated exposure control, adjustment of the mA and/or kV according to patient size and/or use of iterative reconstruction technique. CONTRAST:  128m OMNIPAQUE IOHEXOL 350 MG/ML SOLN COMPARISON:  CT chest, 02/23/2022 CT abdomen pelvis, 02/01/2016 FINDINGS: CTA CHEST FINDINGS VASCULAR Aorta: Satisfactory opacification of the aorta. Normal contour and caliber of the thoracic aorta. No evidence of aneurysm, dissection, or other acute aortic pathology. Aortic atherosclerosis. Cardiovascular: No evidence of pulmonary embolism on limited non-tailored examination. Cardiomegaly. Left and right coronary artery calcifications. No pericardial effusion. Review of the MIP images confirms the above findings. NON VASCULAR Mediastinum/Nodes: No enlarged mediastinal, hilar, or axillary lymph nodes. Small hiatal hernia. Thyroid gland, trachea, and esophagus demonstrate no significant findings. Lungs/Pleura:  Moderate centrilobular and paraseptal emphysema. Diffuse bilateral bronchial wall thickening. New heterogeneous and consolidative airspace opacity throughout the bilateral lung bases, most conspicuous in the dependent right lower lobe (series 12, image 106). Underlying bandlike scarring, particularly of the right middle lobe. No pleural effusion or pneumothorax. Musculoskeletal: No chest wall abnormality. No acute osseous findings. Review of the MIP images confirms the above findings. CTA ABDOMEN AND PELVIS FINDINGS VASCULAR Normal contour and caliber of the abdominal aorta. No evidence of aneurysm, dissection, or other acute aortic pathology. Standard branching pattern of the abdominal aorta with solitary bilateral renal arteries. Review of the MIP images confirms the above findings. NON-VASCULAR Hepatobiliary: No solid liver abnormality is seen. No gallstones, gallbladder wall thickening, or biliary dilatation. Pancreas: Unremarkable. No pancreatic ductal dilatation or surrounding inflammatory changes. Spleen: Normal in size without significant abnormality. Adrenals/Urinary Tract: Adrenal glands are unremarkable. Kidneys are normal, without renal calculi, solid lesion, or hydronephrosis. Bladder is unremarkable. Stomach/Bowel: Stomach is within normal limits. Appendix appears normal. Distended, fecalized loops of distal ileum in the central abdomen, with a sharply decompressed transition point in the anterior left hemiabdomen (series 6, image 131, series 7, image 57), and decompression of the terminal ileum and colon. Descending and sigmoid diverticulosis. Lymphatic: No enlarged abdominal or pelvic lymph nodes. Reproductive: No mass or other significant abnormality. Pessary in the vagina. Other: No abdominal wall hernia or abnormality. Small volume free fluid in the low pelvis. Musculoskeletal: No acute osseous findings. IMPRESSION: 1. Normal contour and caliber of the thoracic and abdominal aorta. No evidence of  aneurysm, dissection, or other acute aortic pathology. 2. Distended, fecalized loops of distal ileum in the central abdomen, with a sharply decompressed transition point in the anterior left hemiabdomen , and decompression of the terminal ileum and colon. Findings are consistent with small bowel obstruction, possibly due to adhesions. 3. Small volume free fluid in the low pelvis, likely reactive. 4. New heterogeneous and consolidative airspace opacity throughout the bilateral lung bases, most conspicuous in the dependent right lower lobe. Findings are consistent with multifocal infection or aspiration. 5. Descending and sigmoid diverticulosis without evidence of acute diverticulitis. 6. Emphysema and diffuse bilateral bronchial wall thickening. 7. Coronary artery disease. Aortic Atherosclerosis (ICD10-I70.0) and Emphysema (ICD10-J43.9). Electronically Signed   By: ADelanna AhmadiM.D.   On: 05/20/2022 11:08    Procedures Procedures  Remain on constant cardiac monitoring initially sinus tachycardia improved to normal sinus rhythm with normal rates. Medications Ordered in ED Medications  azithromycin (ZITHROMAX) 500 mg in sodium chloride 0.9 % 250 mL IVPB (has no administration in time range)  lactated ringers bolus 1,000 mL (has no administration in time range)  ondansetron (ZOFRAN) injection 4 mg (has no administration in time range)    Or  ondansetron (ZOFRAN) 8  mg in sodium chloride 0.9 % 50 mL IVPB (has no administration in time range)  prochlorperazine (COMPAZINE) injection 5-10 mg (has no administration in time range)  lip balm (CARMEX) ointment (has no administration in time range)  phenol (CHLORASEPTIC) mouth spray 2 spray (has no administration in time range)  menthol-cetylpyridinium (CEPACOL) lozenge 3 mg (has no administration in time range)  magic mouthwash (has no administration in time range)  alum & mag hydroxide-simeth (MAALOX/MYLANTA) 200-200-20 MG/5ML suspension 30 mL (has no  administration in time range)  bisacodyl (DULCOLAX) suppository 10 mg (has no administration in time range)  diatrizoate meglumine-sodium (GASTROGRAFIN) 66-10 % solution 90 mL (has no administration in time range)  ondansetron (ZOFRAN-ODT) disintegrating tablet 4 mg (4 mg Oral Given 05/20/22 0954)  HYDROmorphone (DILAUDID) injection 1 mg (1 mg Intravenous Given 05/20/22 0954)  iohexol (OMNIPAQUE) 350 MG/ML injection 100 mL (100 mLs Intravenous Contrast Given 05/20/22 1039)  sodium chloride (PF) 0.9 % injection (  Given 05/20/22 1047)  ipratropium-albuterol (DUONEB) 0.5-2.5 (3) MG/3ML nebulizer solution 3 mL (3 mLs Nebulization Given 05/20/22 1122)  cefTRIAXone (ROCEPHIN) 2 g in sodium chloride 0.9 % 100 mL IVPB (0 g Intravenous Stopped 05/20/22 1251)    ED Course/ Medical Decision Making/ A&P Clinical Course as of 05/20/22 1253  Fri May 20, 2022  1022 Labs reviewed and very reassuring.  Normal white blood cell count 10.4.  Normal creatinine 0.77.  Mild hypochloremia 96.  No other acute electrolyte abnormalities.  No transaminitis and normal total bilirubin 0.8.  Lipase 26 within normal limits unlikely pancreatitis.  EKG sinus rhythm with nonspecific T wave changes with no change from previous tracings and reassuring high-sensitivity troponin 4, unlikely atypical ACS. [VB]  8657 I personally reviewed patient's CTA scan, do note dilated bowel loops and concern for SBO with sharp transition point left hemiabdomen.  Also bilateral infiltrates in the lower lobes concerning for multifocal pneumonia.  With normal white blood cell count 10.4, no fever, doubt sepsis but will send blood cultures and lactate and start on community-acquired pneumonia antibiotics Rocephin and azithromycin.  Will page general surgery. [VB]  1237 Spoke with Saverio Danker from surgery who will be down to evaluate the patient.  She recommends medical admission at this time.  She will give any further epic dates if requesting NG tube. Holding off  now, remaining npo. [VB]  8469 Spoke with Dr. Marylyn Ishihara who has accepted patient for medical admission. [VB]    Clinical Course User Index [VB] Elgie Congo, MD                           Medical Decision Making [name] is a 62 y.o. female. With pmh COPD/chronic hypoxic RF on 5 L, CAD, HLD, recent COPD exacerbation admissions in setting of Influenza infection presenting with complaints of stabbing abdominal pain that woke her up around 2 AM last night.   Patient's pain is diffuse in nature but mainly localizing in the epigastrium and umbilicus region but also reported left lateral chest, based on her history and presentation, differential is broad and includes but not limited to gastroenteritis, pancreatitis, biliary disease, mesenteric ischemia, bowel obstruction, or possible dissection considering acute onset and significant discomfort upon arrival among multiple other etiologies.  Labs reviewed and very reassuring.  Normal white blood cell count 10.4.  Normal creatinine 0.77.  Mild hypochloremia 96.  No other acute electrolyte abnormalities.  No transaminitis and normal total bilirubin 0.8.  Lipase 26  within normal limits unlikely pancreatitis.  EKG sinus rhythm with nonspecific T wave changes with no change from previous tracings and reassuring high-sensitivity troponin 4, unlikely atypical ACS.   I personally reviewed patient's CTA scan, do note dilated bowel loops and concern for SBO with sharp transition point left hemiabdomen.  Also bilateral infiltrates in the lower lobes concerning for multifocal pneumonia.  With normal white blood cell count 10.4, no fever, doubt sepsis but will send blood cultures and lactate and start on community-acquired pneumonia antibiotics Rocephin and azithromycin.  General surgery consulted and admitted to Dr Marylyn Ishihara of medicine for medical management SBO/multifocal pneumonia.    Amount and/or Complexity of Data Reviewed Labs: ordered. Radiology:  ordered.  Risk Prescription drug management. Decision regarding hospitalization.    Final Clinical Impression(s) / ED Diagnoses Final diagnoses:  Abdominal pain, unspecified abdominal location  SBO (small bowel obstruction) ([place])  Multifocal pneumonia    Rx / DC Orders ED Discharge Orders     None         Elgie Congo, MD 05/20/22 1253

## 2022-05-21 ENCOUNTER — Encounter (HOSPITAL_COMMUNITY): Payer: Self-pay | Admitting: Internal Medicine

## 2022-05-21 DIAGNOSIS — J439 Emphysema, unspecified: Secondary | ICD-10-CM | POA: Insufficient documentation

## 2022-05-21 DIAGNOSIS — J189 Pneumonia, unspecified organism: Secondary | ICD-10-CM | POA: Diagnosis not present

## 2022-05-21 DIAGNOSIS — J9611 Chronic respiratory failure with hypoxia: Secondary | ICD-10-CM | POA: Diagnosis not present

## 2022-05-21 DIAGNOSIS — I700 Atherosclerosis of aorta: Secondary | ICD-10-CM

## 2022-05-21 DIAGNOSIS — K56609 Unspecified intestinal obstruction, unspecified as to partial versus complete obstruction: Secondary | ICD-10-CM | POA: Diagnosis not present

## 2022-05-21 HISTORY — DX: Emphysema, unspecified: J43.9

## 2022-05-21 HISTORY — DX: Atherosclerosis of aorta: I70.0

## 2022-05-21 LAB — URINALYSIS, ROUTINE W REFLEX MICROSCOPIC
Bilirubin Urine: NEGATIVE
Glucose, UA: NEGATIVE mg/dL
Hgb urine dipstick: NEGATIVE
Ketones, ur: 20 mg/dL — AB
Nitrite: NEGATIVE
Protein, ur: 30 mg/dL — AB
Specific Gravity, Urine: 1.032 — ABNORMAL HIGH (ref 1.005–1.030)
pH: 5 (ref 5.0–8.0)

## 2022-05-21 MED ORDER — MONTELUKAST SODIUM 10 MG PO TABS
10.0000 mg | ORAL_TABLET | Freq: Every day | ORAL | Status: DC
  Administered 2022-05-21 – 2022-05-23 (×3): 10 mg via ORAL
  Filled 2022-05-21 (×3): qty 1

## 2022-05-21 MED ORDER — ARFORMOTEROL TARTRATE 15 MCG/2ML IN NEBU
15.0000 ug | INHALATION_SOLUTION | Freq: Two times a day (BID) | RESPIRATORY_TRACT | Status: DC
  Administered 2022-05-21 – 2022-05-24 (×6): 15 ug via RESPIRATORY_TRACT
  Filled 2022-05-21 (×4): qty 2

## 2022-05-21 MED ORDER — NITROGLYCERIN 0.4 MG SL SUBL: 0.4000 mg | SUBLINGUAL_TABLET | SUBLINGUAL | Status: DC | PRN

## 2022-05-21 MED ORDER — LACTATED RINGERS IV SOLN: INTRAVENOUS | Status: DC

## 2022-05-21 MED ORDER — SODIUM CHLORIDE 0.9 % IV SOLN
1.0000 g | INTRAVENOUS | Status: DC
  Administered 2022-05-21 – 2022-05-23 (×3): 1 g via INTRAVENOUS
  Filled 2022-05-21 (×4): qty 10

## 2022-05-21 MED ORDER — ENOXAPARIN SODIUM 40 MG/0.4ML IJ SOSY
40.0000 mg | PREFILLED_SYRINGE | INTRAMUSCULAR | Status: DC
  Administered 2022-05-21 – 2022-05-24 (×4): 40 mg via SUBCUTANEOUS
  Filled 2022-05-21 (×4): qty 0.4

## 2022-05-21 NOTE — Progress Notes (Signed)
SBO (small bowel obstruction) (HCC)  Subjective: Had a couple small BM with suppository, passing flatus  Objective: Vital signs in last 24 hours: Temp:  [97.7 F (36.5 C)-98.9 F (37.2 C)] 98.2 F (36.8 C) (01/06 0748) Pulse Rate:  [87-114] 96 (01/06 0748) Resp:  [13-20] 20 (01/06 0748) BP: (106-145)/(74-127) 120/82 (01/06 0748) SpO2:  [88 %-99 %] 99 % (01/06 0748) Weight:  [78 kg] 78 kg (01/05 0851) Last BM Date : 05/20/22  Intake/Output from previous day: 01/05 0701 - 01/06 0700 In: 715.5 [IV Piggyback:715.5] Out: -  Intake/Output this shift: No intake/output data recorded.  General appearance: alert and cooperative GI: normal findings: soft, non-tender  Lab Results:  Results for orders placed or performed during the hospital encounter of 05/20/22 (from the past 24 hour(s))  Lipase, blood     Status: None   Collection Time: 05/20/22  8:53 AM  Result Value Ref Range   Lipase 26 11 - 51 U/L  Comprehensive metabolic panel     Status: Abnormal   Collection Time: 05/20/22  8:53 AM  Result Value Ref Range   Sodium 135 135 - 145 mmol/L   Potassium 3.5 3.5 - 5.1 mmol/L   Chloride 96 (L) 98 - 111 mmol/L   CO2 29 22 - 32 mmol/L   Glucose, Bld 113 (H) 70 - 99 mg/dL   BUN 10 8 - 23 mg/dL   Creatinine, Ser 0.77 0.44 - 1.00 mg/dL   Calcium 9.2 8.9 - 10.3 mg/dL   Total Protein 8.1 6.5 - 8.1 g/dL   Albumin 3.7 3.5 - 5.0 g/dL   AST 18 15 - 41 U/L   ALT 20 0 - 44 U/L   Alkaline Phosphatase 57 38 - 126 U/L   Total Bilirubin 0.8 0.3 - 1.2 mg/dL   GFR, Estimated >60 >60 mL/min   Anion gap 10 5 - 15  CBC     Status: None   Collection Time: 05/20/22  8:53 AM  Result Value Ref Range   WBC 10.4 4.0 - 10.5 K/uL   RBC 4.48 3.87 - 5.11 MIL/uL   Hemoglobin 12.4 12.0 - 15.0 g/dL   HCT 40.2 36.0 - 46.0 %   MCV 89.7 80.0 - 100.0 fL   MCH 27.7 26.0 - 34.0 pg   MCHC 30.8 30.0 - 36.0 g/dL   RDW 15.2 11.5 - 15.5 %   Platelets 330 150 - 400 K/uL   nRBC 0.0 0.0 - 0.2 %  Troponin I (High  Sensitivity)     Status: None   Collection Time: 05/20/22  8:53 AM  Result Value Ref Range   Troponin I (High Sensitivity) 4 <18 ng/L  Resp panel by RT-PCR (RSV, Flu A&B, Covid) Anterior Nasal Swab     Status: None   Collection Time: 05/20/22  8:57 AM   Specimen: Anterior Nasal Swab  Result Value Ref Range   SARS Coronavirus 2 by RT PCR NEGATIVE NEGATIVE   Influenza A by PCR NEGATIVE NEGATIVE   Influenza B by PCR NEGATIVE NEGATIVE   Resp Syncytial Virus by PCR NEGATIVE NEGATIVE  Troponin I (High Sensitivity)     Status: None   Collection Time: 05/20/22 11:01 AM  Result Value Ref Range   Troponin I (High Sensitivity) 3 <18 ng/L  Lactic acid, plasma     Status: None   Collection Time: 05/20/22 12:11 PM  Result Value Ref Range   Lactic Acid, Venous 0.7 0.5 - 1.9 mmol/L  Lactic acid, plasma  Status: None   Collection Time: 05/20/22  1:39 PM  Result Value Ref Range   Lactic Acid, Venous 0.9 0.5 - 1.9 mmol/L     Studies/Results Radiology     MEDS, Scheduled  bisacodyl  10 mg Rectal Daily   diatrizoate meglumine-sodium  90 mL Per NG tube Once   enoxaparin (LOVENOX) injection  40 mg Subcutaneous Q24H   ipratropium-albuterol  3 mL Nebulization TID   lip balm   Topical BID   pantoprazole (PROTONIX) IV  40 mg Intravenous Q24H     Assessment: SBO (small bowel obstruction) ([place]) Unable to place NG Pt now with bowel function but still nauseated with some distention Plan:  Cont NPO Ambulate in hall  LOS: 1 day    Rosario Adie, MD [facility]  Patient's medical decision making was straightforward (25 mins met or exceeded with patient care and documentation).   05/21/2022 7:54 AM

## 2022-05-21 NOTE — Progress Notes (Signed)
  Progress Note   Patient: [name] KXF:[id number] DOB: Jun 17, 1960 DOA: 05/20/2022     1 DOS: the patient was seen and examined on 05/21/2022   Brief hospital course: 62 year old woman PMH including COPD with chronic hypoxic respiratory on 4-5 L nasal cannula at baseline who presented with abdominal pain.  Admitted for small bowel obstruction and pneumonia. 1/5 seen by general surgery with plans for conservative management 1/6 conservative management continues, remains n.p.o.  Respiratory status appears stable.  Assessment and Plan: SBO Most likely secondary to adhesive disease from previous hernia repair.   Conservative management with NGT (unable to place) bowel rest, and SBO protocol.   Now with some bowel function but nauseous. Remain NPO per surgery.   Multifocal PNA Chronic hypoxic respiratory failure COPD  Emphysema CTA chest: no acute aortic pathology. SBO. Bilateral lung opacities consistent with multifocal infection or aspiration.  CBC WNL on admission. Lactic acid WNL on admission BC pending Oxygen requirement stable, no leukocytosis.  I think we can narrow cefepime to ceftriaxone.  Follow-up MRSA PCR but would favor discontinuing vancomycin unless positive. Follow-up Legionella and strep pneumoniae testing. Resume bronchodilators and Singulair   CAD Stable.  Resume medications when able to take p.o.   GERD PPI  Aortic atherosclerosis Continue Repatha as an outpatient  Subjective:  Has some nausea, no vomiting Some BM last night Pain last night, but none now  Physical Exam: Vitals:   05/20/22 2330 05/21/22 0350 05/21/22 0748 05/21/22 0826  BP: 138/84 129/85 120/82   Pulse: (!) 110 94 96   Resp:  20 20   Temp: 97.9 F (36.6 C) 98.9 F (37.2 C) 98.2 F (36.8 C)   TempSrc: Oral  Oral   SpO2: 98% 99% 99% 99%  Weight:      Height:       Physical Exam Vitals reviewed.  Constitutional:      General: She is not in acute distress.    Appearance: She is  not ill-appearing or toxic-appearing.  Cardiovascular:     Rate and Rhythm: Normal rate and regular rhythm.     Heart sounds: No murmur heard. Pulmonary:     Effort: Pulmonary effort is normal. No respiratory distress.     Breath sounds: No wheezing, rhonchi or rales.  Abdominal:     Palpations: Abdomen is soft.  Neurological:     Mental Status: She is alert.  Psychiatric:        Mood and Affect: Mood normal.        Behavior: Behavior normal.     Data Reviewed: CMP unremarkable on admission Lactic acid WNL on admission Troponins negative on admission CBC WNL on admission [facility] [place] pending CTA chest: no acute aortic pathology. SBO. Bilateral lung opacities consistent with multifocal infection or aspiration. Emphysema. Aortic atherosclerosis. EKG SR no acute changes  Family Communication: none present or requested  Disposition: Status is: Inpatient Remains inpatient appropriate because: SBO  Planned Discharge Destination: Home    Time spent: 35 minutes  Author: Murray Hodgkins, MD 05/21/2022 10:38 AM  For on call review [email or web address].

## 2022-05-21 NOTE — Hospital Course (Addendum)
62 year old woman PMH including COPD with chronic hypoxic respiratory on 4-5 L nasal cannula at baseline who presented with abdominal pain.  Admitted for small bowel obstruction and pneumonia. 1/5-6 seen by general surgery with plans for conservative management 1/7 some clinical improvement although remains NPO.  Management per general surgery.

## 2022-05-22 DIAGNOSIS — K56609 Unspecified intestinal obstruction, unspecified as to partial versus complete obstruction: Secondary | ICD-10-CM | POA: Diagnosis not present

## 2022-05-22 DIAGNOSIS — J449 Chronic obstructive pulmonary disease, unspecified: Secondary | ICD-10-CM | POA: Diagnosis not present

## 2022-05-22 DIAGNOSIS — J439 Emphysema, unspecified: Secondary | ICD-10-CM | POA: Diagnosis not present

## 2022-05-22 DIAGNOSIS — J189 Pneumonia, unspecified organism: Secondary | ICD-10-CM | POA: Diagnosis not present

## 2022-05-22 LAB — BASIC METABOLIC PANEL
Anion gap: 13 (ref 5–15)
BUN: 9 mg/dL (ref 8–23)
CO2: 23 mmol/L (ref 22–32)
Calcium: 8.2 mg/dL — ABNORMAL LOW (ref 8.9–10.3)
Chloride: 99 mmol/L (ref 98–111)
Creatinine, Ser: 0.63 mg/dL (ref 0.44–1.00)
GFR, Estimated: >60 mL/min (ref >60)
Glucose, Bld: 70 mg/dL (ref 70–99)
Potassium: 4 mmol/L (ref 3.5–5.1)
Sodium: 135 mmol/L (ref 135–145)

## 2022-05-22 LAB — MRSA NEXT GEN BY PCR, NASAL: MRSA by PCR Next Gen: NOT DETECTED

## 2022-05-22 NOTE — Progress Notes (Signed)
  Progress Note   Patient: [name] TXM:[id number] DOB: 1961-02-21 DOA: 05/20/2022     2 DOS: the patient was seen and examined on 05/22/2022   Brief hospital course: 62 year old woman PMH including COPD with chronic hypoxic respiratory on 4-5 L nasal cannula at baseline who presented with abdominal pain.  Admitted for small bowel obstruction and pneumonia. 1/5-6 seen by general surgery with plans for conservative management 1/7 some clinical improvement although remains NPO.  Management per general surgery.  Assessment and Plan: SBO Most likely secondary to adhesive disease from previous hernia repair.   Conservative management (unable to place NG tube).  Continue bowel rest, and SBO protocol.   Continue management per general surgery.   Multifocal PNA Chronic hypoxic respiratory failure COPD  Emphysema CTA chest: no acute aortic pathology. SBO. Bilateral lung opacities consistent with multifocal infection or aspiration.  CBC WNL on admission. Lactic acid WNL on admission BC pending Oxygen requirement stable, no leukocytosis.  Appears short course of antibiotics.  Stop vancomycin, MRSA PCR is negative. Follow-up Legionella and strep pneumoniae testing. Continue bronchodilators and Singulair   CAD Stable.  Resume medications when able to take p.o.   GERD PPI   Aortic atherosclerosis Continue Repatha as an outpatient      Subjective:  Feels better, had a BM last   Physical Exam: Vitals:   05/22/22 0801 05/22/22 1229 05/22/22 1333 05/22/22 1335  BP:  120/82    Pulse:  95    Resp:  18    Temp:  97.8 F (36.6 C)    TempSrc:  Oral    SpO2: 92% 100% (!) 89% 100%  Weight:      Height:       Physical Exam Vitals reviewed.  Constitutional:      General: She is not in acute distress.    Appearance: She is not ill-appearing or toxic-appearing.  Cardiovascular:     Rate and Rhythm: Normal rate and regular rhythm.     Heart sounds: No murmur heard. Pulmonary:      Effort: Pulmonary effort is normal. No respiratory distress.     Breath sounds: No wheezing, rhonchi or rales.  Abdominal:     Palpations: Abdomen is soft.  Neurological:     Mental Status: She is alert.  Psychiatric:        Mood and Affect: Mood normal.        Behavior: Behavior normal.    Data Reviewed: BMP noted  Family Communication: none  Disposition: Status is: Inpatient Remains inpatient appropriate because: SBO  Planned Discharge Destination: Home    Time spent: 20 minutes  Author: Murray Hodgkins, MD 05/22/2022 4:32 PM  For on call review [email or web address].

## 2022-05-22 NOTE — Progress Notes (Signed)
Subjective/Chief Complaint: Feels better. No complaints. States she is passing flatus and had a bm yesterday   Objective: Vital signs in last 24 hours: Temp:  [98.1 F (36.7 C)-98.8 F (37.1 C)] 98.1 F (36.7 C) (01/07 0533) Pulse Rate:  [88-108] 108 (01/07 0533) Resp:  [18-20] 20 (01/07 0533) BP: (111-136)/(72-89) 136/81 (01/07 0533) SpO2:  [92 %-100 %] 92 % (01/07 0801) FiO2 (%):  [40 %] 40 % (01/06 0826) Last BM Date : 05/20/22  Intake/Output from previous day: 01/06 0701 - 01/07 0700 In: 1235.8 [I.V.:935.8; IV Piggyback:300] Out: -  Intake/Output this shift: No intake/output data recorded.  General appearance: alert and cooperative Resp: clear to auscultation bilaterally Cardio: regular rate and rhythm GI: soft, nontender. Not distended  Lab Results:  Recent Labs    05/20/22 0853  WBC 10.4  HGB 12.4  HCT 40.2  PLT 330   BMET Recent Labs    05/20/22 0853 05/22/22 0615  NA 135 135  K 3.5 4.0  CL 96* 99  CO2 29 23  GLUCOSE 113* 70  BUN 10 9  CREATININE 0.77 0.63  CALCIUM 9.2 8.2*   PT/INR No results for input(s): "LABPROT", "INR" in the last 72 hours. ABG No results for input(s): "PHART", "HCO3" in the last 72 hours.  Invalid input(s): "PCO2", "PO2"  Studies/Results: CT Angio Chest/Abd/Pel for Dissection W and/or Wo Contrast  Result Date: 05/20/2022 CLINICAL DATA:  Left chest pain, upper abdominal stabbing pain, nausea, vomiting, chills EXAM: CT ANGIOGRAPHY CHEST, ABDOMEN AND PELVIS TECHNIQUE: Non-contrast CT of the chest was initially obtained. Multidetector CT imaging through the chest, abdomen and pelvis was performed using the standard protocol during bolus administration of intravenous contrast. Multiplanar reconstructed images and MIPs were obtained and reviewed to evaluate the vascular anatomy. RADIATION DOSE REDUCTION: This exam was performed according to the departmental dose-optimization program which includes automated exposure control,  adjustment of the mA and/or kV according to patient size and/or use of iterative reconstruction technique. CONTRAST:  160m OMNIPAQUE IOHEXOL 350 MG/ML SOLN COMPARISON:  CT chest, 02/23/2022 CT abdomen pelvis, 02/01/2016 FINDINGS: CTA CHEST FINDINGS VASCULAR Aorta: Satisfactory opacification of the aorta. Normal contour and caliber of the thoracic aorta. No evidence of aneurysm, dissection, or other acute aortic pathology. Aortic atherosclerosis. Cardiovascular: No evidence of pulmonary embolism on limited non-tailored examination. Cardiomegaly. Left and right coronary artery calcifications. No pericardial effusion. Review of the MIP images confirms the above findings. NON VASCULAR Mediastinum/Nodes: No enlarged mediastinal, hilar, or axillary lymph nodes. Small hiatal hernia. Thyroid gland, trachea, and esophagus demonstrate no significant findings. Lungs/Pleura: Moderate centrilobular and paraseptal emphysema. Diffuse bilateral bronchial wall thickening. New heterogeneous and consolidative airspace opacity throughout the bilateral lung bases, most conspicuous in the dependent right lower lobe (series 12, image 106). Underlying bandlike scarring, particularly of the right middle lobe. No pleural effusion or pneumothorax. Musculoskeletal: No chest wall abnormality. No acute osseous findings. Review of the MIP images confirms the above findings. CTA ABDOMEN AND PELVIS FINDINGS VASCULAR Normal contour and caliber of the abdominal aorta. No evidence of aneurysm, dissection, or other acute aortic pathology. Standard branching pattern of the abdominal aorta with solitary bilateral renal arteries. Review of the MIP images confirms the above findings. NON-VASCULAR Hepatobiliary: No solid liver abnormality is seen. No gallstones, gallbladder wall thickening, or biliary dilatation. Pancreas: Unremarkable. No pancreatic ductal dilatation or surrounding inflammatory changes. Spleen: Normal in size without significant  abnormality. Adrenals/Urinary Tract: Adrenal glands are unremarkable. Kidneys are normal, without renal calculi, solid lesion, or  hydronephrosis. Bladder is unremarkable. Stomach/Bowel: Stomach is within normal limits. Appendix appears normal. Distended, fecalized loops of distal ileum in the central abdomen, with a sharply decompressed transition point in the anterior left hemiabdomen (series 6, image 131, series 7, image 57), and decompression of the terminal ileum and colon. Descending and sigmoid diverticulosis. Lymphatic: No enlarged abdominal or pelvic lymph nodes. Reproductive: No mass or other significant abnormality. Pessary in the vagina. Other: No abdominal wall hernia or abnormality. Small volume free fluid in the low pelvis. Musculoskeletal: No acute osseous findings. IMPRESSION: 1. Normal contour and caliber of the thoracic and abdominal aorta. No evidence of aneurysm, dissection, or other acute aortic pathology. 2. Distended, fecalized loops of distal ileum in the central abdomen, with a sharply decompressed transition point in the anterior left hemiabdomen , and decompression of the terminal ileum and colon. Findings are consistent with small bowel obstruction, possibly due to adhesions. 3. Small volume free fluid in the low pelvis, likely reactive. 4. New heterogeneous and consolidative airspace opacity throughout the bilateral lung bases, most conspicuous in the dependent right lower lobe. Findings are consistent with multifocal infection or aspiration. 5. Descending and sigmoid diverticulosis without evidence of acute diverticulitis. 6. Emphysema and diffuse bilateral bronchial wall thickening. 7. Coronary artery disease. Aortic Atherosclerosis (ICD10-I70.0) and Emphysema (ICD10-J43.9). Electronically Signed   By: Delanna Ahmadi M.D.   On: 05/20/2022 11:08    Anti-infectives: Anti-infectives (From admission, onward)    Start     Dose/Rate Route Frequency Ordered Stop   05/21/22 1800   vancomycin (VANCOCIN) IVPB 1000 mg/200 mL premix        1,000 mg 200 mL/hr over 60 Minutes Intravenous Every 24 hours 05/20/22 1412     05/21/22 1600  cefTRIAXone (ROCEPHIN) 1 g in sodium chloride 0.9 % 100 mL IVPB        1 g 200 mL/hr over 30 Minutes Intravenous Every 24 hours 05/21/22 1034     05/20/22 1600  ceFEPIme (MAXIPIME) 2 g in sodium chloride 0.9 % 100 mL IVPB  Status:  Discontinued        2 g 200 mL/hr over 30 Minutes Intravenous Every 8 hours 05/20/22 1410 05/21/22 1034   05/20/22 1400  vancomycin (VANCOREADY) IVPB 1500 mg/300 mL        1,500 mg 150 mL/hr over 120 Minutes Intravenous  Once 05/20/22 1346 05/20/22 1718   05/20/22 1145  cefTRIAXone (ROCEPHIN) 2 g in sodium chloride 0.9 % 100 mL IVPB        2 g 200 mL/hr over 30 Minutes Intravenous  Once 05/20/22 1139 05/20/22 1251   05/20/22 1145  azithromycin (ZITHROMAX) 500 mg in sodium chloride 0.9 % 250 mL IVPB        500 mg 250 mL/hr over 60 Minutes Intravenous  Once 05/20/22 1139 05/20/22 1324       Assessment/Plan: s/p * No surgery found * Will recheck abd xrays for sbo If improved then will start clears FEN - NPO/NGT/IVFs per primary VTE - ok for lovenox/heparin ID - none currently needed   CAD COPD, on 4L [place] chronically - of note she just finished a steroid tape for a recent COPD exacerbation yesterday. HLD obesity  LOS: 2 days    [name] 05/22/2022

## 2022-05-23 ENCOUNTER — Telehealth: Payer: Self-pay

## 2022-05-23 DIAGNOSIS — J4489 Other specified chronic obstructive pulmonary disease: Secondary | ICD-10-CM

## 2022-05-23 DIAGNOSIS — J9611 Chronic respiratory failure with hypoxia: Secondary | ICD-10-CM | POA: Diagnosis not present

## 2022-05-23 DIAGNOSIS — K56609 Unspecified intestinal obstruction, unspecified as to partial versus complete obstruction: Secondary | ICD-10-CM | POA: Diagnosis not present

## 2022-05-23 DIAGNOSIS — J189 Pneumonia, unspecified organism: Secondary | ICD-10-CM | POA: Diagnosis not present

## 2022-05-23 DIAGNOSIS — J4550 Severe persistent asthma, uncomplicated: Secondary | ICD-10-CM

## 2022-05-23 LAB — BASIC METABOLIC PANEL
Anion gap: 12 (ref 5–15)
BUN: 8 mg/dL (ref 8–23)
CO2: 26 mmol/L (ref 22–32)
Calcium: 8.6 mg/dL — ABNORMAL LOW (ref 8.9–10.3)
Chloride: 97 mmol/L — ABNORMAL LOW (ref 98–111)
Creatinine, Ser: 0.64 mg/dL (ref 0.44–1.00)
GFR, Estimated: >60 mL/min (ref >60)
Glucose, Bld: 66 mg/dL — ABNORMAL LOW (ref 70–99)
Potassium: 3.9 mmol/L (ref 3.5–5.1)
Sodium: 135 mmol/L (ref 135–145)

## 2022-05-23 LAB — LEGIONELLA PNEUMOPHILA SEROGP 1 UR AG: L. pneumophila Serogp 1 Ur Ag: NEGATIVE

## 2022-05-23 NOTE — Telephone Encounter (Signed)
Submitted a Prior Authorization request to [facility] (commercial plan) for [place] via FAX on CoverMyMeds. Will update once we receive a response.  Key: [id number]  Submitted a Prior Authorization request to [facility] (MEDICARE plan) for [place] via CoverMyMeds. Will update once we receive a response.  Key: [id number]  Knox Saliva, PharmD, MPH, BCPS, CPP Clinical Pharmacist (Rheumatology and Pulmonology)

## 2022-05-23 NOTE — Telephone Encounter (Signed)
Received outdated New start paperwork for [place], however pt appears to have coverage through both Medicare and Commercial. Will conduct investigations with each to determine if the paperwork will need to be redone or if insurances can be run as COB. Alternatively pt may qualify for copay card if commercial plan is primary. Will update as we work through the benefits process.

## 2022-05-23 NOTE — Progress Notes (Signed)
  Progress Note   Patient: [name] [id number]:[id number] DOB: Feb 09, 1961 DOA: 05/20/2022     3 DOS: the patient was seen and examined on 05/23/2022   Brief hospital course: 62 year old woman PMH including COPD with chronic hypoxic respiratory on 4-5 L nasal cannula at baseline who presented with abdominal pain.  Admitted for small bowel obstruction and pneumonia. 1/5-6 seen by general surgery with plans for conservative management 1/7 some clinical improvement although remains NPO.  Management per general surgery.  Assessment and Plan: SBO Most likely secondary to adhesive disease from previous hernia repair.   Conservative management (unable to place NG tube). Continue bowel rest, and SBO protocol.   Continue management per general surgery.   Multifocal PNA Chronic hypoxic respiratory failure COPD  Emphysema CTA chest: no acute aortic pathology. SBO. Bilateral lung opacities consistent with multifocal infection or aspiration.  CBC WNL on admission. Lactic acid WNL on admission BC pending Oxygen requirement stable, no leukocytosis.  Fever short course of antibiotics.  MRSA PCR is negative. Follow-up strep pneumoniae antigen. Continue bronchodilators and Singulair   CAD Stable.     GERD PPI   Aortic atherosclerosis Continue Repatha as an outpatient       Subjective:  Feels better Bowels moving Ambulating   Physical Exam: Vitals:   05/22/22 2051 05/23/22 0425 05/23/22 0811 05/23/22 1434  BP:  108/67  118/68  Pulse:  83  89  Resp:  18  19  Temp:  98 F (36.7 C)  98.4 F (36.9 C)  TempSrc:  Oral  Oral  SpO2: 96% 96% 94% 95%  Weight:      Height:       Physical Exam Vitals reviewed.  Constitutional:      General: She is not in acute distress.    Appearance: She is not ill-appearing or toxic-appearing.  Cardiovascular:     Rate and Rhythm: Normal rate and regular rhythm.     Heart sounds: No murmur heard. Pulmonary:     Effort: Pulmonary effort is normal. No  respiratory distress.     Breath sounds: No wheezing, rhonchi or rales.  Neurological:     Mental Status: She is alert.  Psychiatric:        Mood and Affect: Mood normal.        Behavior: Behavior normal.     Data Reviewed: Glucose 66, BMP, remainder unremarkable.  Family Communication: none  Disposition: Status is: Inpatient Remains inpatient appropriate because: SBO  Planned Discharge Destination: Home    Time spent: 25 minutes  Author: Murray Hodgkins, MD 05/23/2022 6:09 PM  For on call review [email or web address].
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
## 2022-05-23 NOTE — Progress Notes (Signed)
Mobility Specialist - Progress Note   05/23/22 1139  Oxygen Therapy  O2 Device Nasal Cannula  O2 Flow Rate (L/min) 4 L/min  Mobility  Activity Ambulated independently in hallway  Level of Assistance Independent  Assistive Device None  Distance Ambulated (ft) 460 ft  Activity Response Tolerated well  Mobility Referral Yes  $Mobility charge 1 Mobility   Pt received in room standing and agreeable to mobility. Pt took 1x standing rest break due to O2 dropping to 87%. Encouraged pursed lip breathing allowing O2 to come back to 95% (~34mn). No complaints during mobility. Pt SOB at EOS, but stated she was okay. Pt to recliner after session with all needs met.   Pre-mobility: 101 HR, 98% SpO2 (4L [place]) During mobility: 121 HR, 94% SpO2 (4L [place]) Post-mobility: 126 HR,  96% SPO2 (4L [place])  MSet designer

## 2022-05-23 NOTE — Progress Notes (Signed)
Mobility Specialist - Progress Note   05/23/22 1500  Oxygen Therapy  O2 Device Nasal Cannula  O2 Flow Rate (L/min) 4 L/min  Mobility  Activity Ambulated independently in hallway  Level of Assistance Independent  Assistive Device None  Distance Ambulated (ft) 460 ft  Activity Response Tolerated well  Mobility Referral Yes  $Mobility charge 1 Mobility   Pt received in bed and agreeable to mobility. Pt O2 desat to 86% during ambulation. Encouraged pursed lip breathing, bringing O2 back up to 95% (~80mn). Ambulation cut short due to SOB, but pt stated she was okay. No other complaints during mobility. Pt to bed after session with all needs met & family in room.    Pre-mobility: 100 HR, 92% SpO2 During mobility: 124 HR, 95% SpO2 Post-mobility: 108 HR, 97% SPO2  MSet designer

## 2022-05-23 NOTE — Progress Notes (Signed)
[facility] Progress Note     Subjective: CC-  Feeling better. Abdominal pain resolved. Denies n/v. Passing flatus and had a BM yesterday.  Objective: Vital signs in last 24 hours: Temp:  [97.8 F (36.6 C)-98.1 F (36.7 C)] 98 F (36.7 C) (01/08 0425) Pulse Rate:  [83-96] 83 (01/08 0425) Resp:  [18-20] 18 (01/08 0425) BP: (108-120)/(67-82) 108/67 (01/08 0425) SpO2:  [89 %-100 %] 94 % (01/08 0811) Last BM Date : 05/21/22 (very small amount per pt)  Intake/Output from previous day: No intake/output data recorded. Intake/Output this shift: No intake/output data recorded.  PE: Gen:  Alert, NAD, pleasant Pulm: rate and effort normal on [place] Abd: soft, ND, NT  Lab Results:  No results for input(s): "WBC", "HGB", "HCT", "PLT" in the last 72 hours. BMET Recent Labs    05/22/22 0615 05/23/22 0621  NA 135 135  K 4.0 3.9  CL 99 97*  CO2 23 26  GLUCOSE 70 66*  BUN 9 8  CREATININE 0.63 0.64  CALCIUM 8.2* 8.6*   PT/INR No results for input(s): "LABPROT", "INR" in the last 72 hours. CMP     Component Value Date/Time   NA 135 05/23/2022 0621   NA 140 05/26/2021 0918   K 3.9 05/23/2022 0621   CL 97 (L) 05/23/2022 0621   CO2 26 05/23/2022 0621   GLUCOSE 66 (L) 05/23/2022 0621   BUN 8 05/23/2022 0621   BUN 10 05/26/2021 0918   CREATININE 0.64 05/23/2022 0621   CALCIUM 8.6 (L) 05/23/2022 0621   PROT 8.1 05/20/2022 0853   PROT 6.5 05/26/2021 0918   ALBUMIN 3.7 05/20/2022 0853   ALBUMIN 4.2 05/26/2021 0918   AST 18 05/20/2022 0853   ALT 20 05/20/2022 0853   ALKPHOS 57 05/20/2022 0853   BILITOT 0.8 05/20/2022 0853   BILITOT 0.3 05/26/2021 0918   GFRNONAA >60 05/23/2022 0621   GFRAA 106 06/25/2020 1536   Lipase     Component Value Date/Time   LIPASE 26 05/20/2022 0853       Studies/Results: No results found.  Anti-infectives: Anti-infectives (From admission, onward)    Start     Dose/Rate Route Frequency Ordered Stop   05/21/22 1800   vancomycin (VANCOCIN) IVPB 1000 mg/200 mL premix  Status:  Discontinued        1,000 mg 200 mL/hr over 60 Minutes Intravenous Every 24 hours 05/20/22 1412 05/22/22 1141   05/21/22 1600  cefTRIAXone (ROCEPHIN) 1 g in sodium chloride 0.9 % 100 mL IVPB        1 g 200 mL/hr over 30 Minutes Intravenous Every 24 hours 05/21/22 1034     05/20/22 1600  ceFEPIme (MAXIPIME) 2 g in sodium chloride 0.9 % 100 mL IVPB  Status:  Discontinued        2 g 200 mL/hr over 30 Minutes Intravenous Every 8 hours 05/20/22 1410 05/21/22 1034   05/20/22 1400  vancomycin (VANCOREADY) IVPB 1500 mg/300 mL        1,500 mg 150 mL/hr over 120 Minutes Intravenous  Once 05/20/22 1346 05/20/22 1718   05/20/22 1145  cefTRIAXone (ROCEPHIN) 2 g in sodium chloride 0.9 % 100 mL IVPB        2 g 200 mL/hr over 30 Minutes Intravenous  Once 05/20/22 1139 05/20/22 1251   05/20/22 1145  azithromycin (ZITHROMAX) 500 mg in sodium chloride 0.9 % 250 mL IVPB        500 mg 250 mL/hr over 60 Minutes Intravenous  Once  05/20/22 1139 05/20/22 1324        Assessment/Plan SBO - CTA 1/5 shows distended and fecalized loops of distal ileum in the central abdomen with a sharply decompressed transition point in the anterior left hemiabdomen with decompression of the terminal ileum and colon consistent with small bowel obstruction, possibly due to adhesions - unable to place NG tube so patient never had SBO protocol but her obstructive symptoms quickly resolved - Abdominal pain, nausea, and vomiting resolved, patient is passing flatus and had a BM yesterday. Trial clear liquids today.    FEN - CLD, IVFs per primary VTE - lovenox ID - none currently needed   CAD COPD, on 4L [place] chronically - of note she just finished a steroid taper for a recent COPD exacerbation HLD obesity  I reviewed last 24 h vitals and pain scores, last 48 h intake and output, and last 24 h labs and trends.    LOS: 3 days    [facility] 05/23/2022, 10:54 AM Please see Amion for pager number during day hours 7:00am-4:30pm

## 2022-05-24 DIAGNOSIS — J9611 Chronic respiratory failure with hypoxia: Secondary | ICD-10-CM | POA: Diagnosis not present

## 2022-05-24 DIAGNOSIS — J449 Chronic obstructive pulmonary disease, unspecified: Secondary | ICD-10-CM | POA: Diagnosis not present

## 2022-05-24 DIAGNOSIS — K56609 Unspecified intestinal obstruction, unspecified as to partial versus complete obstruction: Secondary | ICD-10-CM | POA: Diagnosis not present

## 2022-05-24 DIAGNOSIS — J189 Pneumonia, unspecified organism: Secondary | ICD-10-CM | POA: Diagnosis not present

## 2022-05-24 LAB — STREP PNEUMONIAE URINARY ANTIGEN: Strep Pneumo Urinary Antigen: NEGATIVE

## 2022-05-24 LAB — BASIC METABOLIC PANEL
Anion gap: 10 (ref 5–15)
BUN: <5 mg/dL — ABNORMAL LOW (ref 8–23)
CO2: 28 mmol/L (ref 22–32)
Calcium: 8.5 mg/dL — ABNORMAL LOW (ref 8.9–10.3)
Chloride: 98 mmol/L (ref 98–111)
Creatinine, Ser: 0.52 mg/dL (ref 0.44–1.00)
GFR, Estimated: >60 mL/min (ref >60)
Glucose, Bld: 90 mg/dL (ref 70–99)
Potassium: 3.5 mmol/L (ref 3.5–5.1)
Sodium: 136 mmol/L (ref 135–145)

## 2022-05-24 MED ORDER — POLYETHYLENE GLYCOL 3350 17 G PO PACK
17.0000 g | PACK | Freq: Once | ORAL | Status: AC
  Administered 2022-05-24: 17 g via ORAL
  Filled 2022-05-24: qty 1

## 2022-05-24 MED ORDER — POLYETHYLENE GLYCOL 3350 17 G PO PACK
17.0000 g | PACK | Freq: Every day | ORAL | Status: DC | PRN
  Administered 2022-05-24: 17 g via ORAL
  Filled 2022-05-24: qty 1

## 2022-05-24 MED ORDER — PANTOPRAZOLE SODIUM 40 MG PO TBEC: 40.0000 mg | DELAYED_RELEASE_TABLET | Freq: Every day | ORAL | Status: DC

## 2022-05-24 NOTE — Discharge Summary (Signed)
Physician Discharge Summary   Patient: [name] MRN: [id number] DOB: [birth year]  Admit date:     05/20/2022  Discharge date: 05/24/22  Discharge Physician: [name]   PCP: Tami Mccreedy, MD   Recommendations at discharge:  SBO  Discharge Diagnoses: Principal Problem:   SBO (small bowel obstruction) ([place]) Active Problems:   Multifocal pneumonia   COPD with hypoxia ([place])   GERD (gastroesophageal reflux disease)   Obesity (BMI 30-39.9)   CAD (coronary artery disease)   Hyperlipidemia   History of COVID-19   Chronic respiratory failure with hypoxia (HCC)   Emphysema lung (HCC)   Aortic atherosclerosis (HCC)  Resolved Problems:   * No resolved hospital problems. *  Hospital Course: 62 year old woman PMH including COPD with chronic hypoxic respiratory on 4-5 L nasal cannula at baseline who presented with abdominal pain.  Admitted for small bowel obstruction and pneumonia.  Seen by general surgery, treated with conservative management with gradual resolution of small bowel obstruction, diet advanced and discharged home in good condition.  Multifocal pneumonia was treated with empiric antibiotics with clinical improvement.  SBO Most likely secondary to adhesive disease from previous hernia repair.   Conservative management Spontaneously resolved.   Multifocal PNA Chronic hypoxic respiratory failure COPD  Emphysema CTA chest: no acute aortic pathology. SBO. Bilateral lung opacities consistent with multifocal infection or aspiration.  CBC WNL on admission. Lactic acid WNL on admission BC NGTD Oxygen requirement stable, no leukocytosis.  Fever short course of antibiotics.  MRSA PCR is negative. Continue bronchodilators and Singulair S/p 5 days abx, has completed treatment   CAD Stable.     GERD PPI   Aortic atherosclerosis Continue Repatha as an outpatient   Consultants:  General surgery  Procedures performed:  None    Disposition: Home Diet  recommendation:  Soft diet DISCHARGE MEDICATION: Allergies as of 05/24/2022       Reactions   Amoxicillin Shortness Of Breath   Tylenol [acetaminophen] Shortness Of Breath   Statins    Zetia [ezetimibe]         Medication List     STOP taking these medications    pantoprazole 40 MG tablet Commonly known as: PROTONIX   predniSONE 10 MG tablet Commonly known as: DELTASONE       TAKE these medications    albuterol 108 (90 Base) MCG/ACT inhaler Commonly known as: VENTOLIN HFA Inhale 2 puffs into the lungs every 4 (four) hours as needed for wheezing or shortness of breath.   albuterol (2.5 MG/3ML) 0.083% nebulizer solution Commonly known as: PROVENTIL Take 3 mLs (2.5 mg total) by nebulization every 6 (six) hours as needed for wheezing or shortness of breath.   arformoterol 15 MCG/2ML Nebu Commonly known as: BROVANA Take 2 mLs (15 mcg total) by nebulization 2 (two) times daily.   famotidine 20 MG tablet Commonly known as: PEPCID Take 1 tablet (20 mg total) by mouth at bedtime.   guaiFENesin-dextromethorphan 100-10 MG/5ML syrup Commonly known as: ROBITUSSIN DM Take 10 mLs by mouth every 4 (four) hours as needed for cough.   hydrOXYzine 10 MG tablet Commonly known as: ATARAX Take 1 tablet (10 mg total) by mouth 3 (three) times daily as needed for anxiety.   montelukast 10 MG tablet Commonly known as: SINGULAIR TAKE 1 TABLET BY MOUTH EVERYDAY AT BEDTIME What changed: See the new instructions.   nitroGLYCERIN 0.4 MG SL tablet Commonly known as: NITROSTAT Place 1 tablet (0.4 mg total) under the tongue every 5 (five) minutes as needed  for chest pain.   OXYGEN Inhale 4 L into the lungs at bedtime.   Repatha SureClick 474 MG/ML [place] Generic drug: Evolocumab Inject 140 mg into the skin every 14 (fourteen) days.   revefenacin 175 MCG/3ML nebulizer solution Commonly known as: YUPELRI Take 3 mLs (175 mcg total) by nebulization daily.        Follow-up  Information     Tami Mccreedy, MD Follow up.   Specialty: Internal Medicine Why: As needed Contact information: [place] [id number] [phone]         Care, [facility] Follow up.   Specialty: [place] Why: Alvis Lemmings will follow up with you at discharge to provide home health services Contact information: [place] [address] [phone]                Discharge Exam: [name] Weights   05/20/22 0851  Weight: 78 kg   Feels better Bowels moved Breathing ok  Physical Exam Vitals reviewed.  Cardiovascular:     Rate and Rhythm: Normal rate and regular rhythm.     Heart sounds: No murmur heard. Pulmonary:     Effort: Pulmonary effort is normal. No respiratory distress.     Breath sounds: No wheezing, rhonchi or rales.  Abdominal:     Palpations: Abdomen is soft.  Musculoskeletal:     Right lower leg: No edema.     Left lower leg: No edema.  Neurological:     Mental Status: She is alert.  Psychiatric:        Mood and Affect: Mood normal.        Behavior: Behavior normal.       Condition at discharge: good  The results of significant diagnostics from this hospitalization (including imaging, microbiology, ancillary and laboratory) are listed below for reference.   Imaging Studies: CT Angio Chest/Abd/Pel for Dissection W and/or Wo Contrast  Result Date: 05/20/2022 CLINICAL DATA:  Left chest pain, upper abdominal stabbing pain, nausea, vomiting, chills EXAM: CT ANGIOGRAPHY CHEST, ABDOMEN AND PELVIS TECHNIQUE: Non-contrast CT of the chest was initially obtained. Multidetector CT imaging through the chest, abdomen and pelvis was performed using the standard protocol during bolus administration of intravenous contrast. Multiplanar reconstructed images and MIPs were obtained and reviewed to evaluate the vascular anatomy. RADIATION DOSE REDUCTION: This exam was performed according to the  departmental dose-optimization program which includes automated exposure control, adjustment of the mA and/or kV according to patient size and/or use of iterative reconstruction technique. CONTRAST:  145m OMNIPAQUE IOHEXOL 350 MG/ML SOLN COMPARISON:  CT chest, 02/23/2022 CT abdomen pelvis, 02/01/2016 FINDINGS: CTA CHEST FINDINGS VASCULAR Aorta: Satisfactory opacification of the aorta. Normal contour and caliber of the thoracic aorta. No evidence of aneurysm, dissection, or other acute aortic pathology. Aortic atherosclerosis. Cardiovascular: No evidence of pulmonary embolism on limited non-tailored examination. Cardiomegaly. Left and right coronary artery calcifications. No pericardial effusion. Review of the MIP images confirms the above findings. NON VASCULAR Mediastinum/Nodes: No enlarged mediastinal, hilar, or axillary lymph nodes. Small hiatal hernia. Thyroid gland, trachea, and esophagus demonstrate no significant findings. Lungs/Pleura: Moderate centrilobular and paraseptal emphysema. Diffuse bilateral bronchial wall thickening. New heterogeneous and consolidative airspace opacity throughout the bilateral lung bases, most conspicuous in the dependent right lower lobe (series 12, image 106). Underlying bandlike scarring, particularly of the right middle lobe. No pleural effusion or pneumothorax. Musculoskeletal: No chest wall abnormality. No acute osseous findings. Review of the MIP images confirms the above findings.  CTA ABDOMEN AND PELVIS FINDINGS VASCULAR Normal contour and caliber of the abdominal aorta. No evidence of aneurysm, dissection, or other acute aortic pathology. Standard branching pattern of the abdominal aorta with solitary bilateral renal arteries. Review of the MIP images confirms the above findings. NON-VASCULAR Hepatobiliary: No solid liver abnormality is seen. No gallstones, gallbladder wall thickening, or biliary dilatation. Pancreas: Unremarkable. No pancreatic ductal dilatation or  surrounding inflammatory changes. Spleen: Normal in size without significant abnormality. Adrenals/Urinary Tract: Adrenal glands are unremarkable. Kidneys are normal, without renal calculi, solid lesion, or hydronephrosis. Bladder is unremarkable. Stomach/Bowel: Stomach is within normal limits. Appendix appears normal. Distended, fecalized loops of distal ileum in the central abdomen, with a sharply decompressed transition point in the anterior left hemiabdomen (series 6, image 131, series 7, image 57), and decompression of the terminal ileum and colon. Descending and sigmoid diverticulosis. Lymphatic: No enlarged abdominal or pelvic lymph nodes. Reproductive: No mass or other significant abnormality. Pessary in the vagina. Other: No abdominal wall hernia or abnormality. Small volume free fluid in the low pelvis. Musculoskeletal: No acute osseous findings. IMPRESSION: 1. Normal contour and caliber of the thoracic and abdominal aorta. No evidence of aneurysm, dissection, or other acute aortic pathology. 2. Distended, fecalized loops of distal ileum in the central abdomen, with a sharply decompressed transition point in the anterior left hemiabdomen , and decompression of the terminal ileum and colon. Findings are consistent with small bowel obstruction, possibly due to adhesions. 3. Small volume free fluid in the low pelvis, likely reactive. 4. New heterogeneous and consolidative airspace opacity throughout the bilateral lung bases, most conspicuous in the dependent right lower lobe. Findings are consistent with multifocal infection or aspiration. 5. Descending and sigmoid diverticulosis without evidence of acute diverticulitis. 6. Emphysema and diffuse bilateral bronchial wall thickening. 7. Coronary artery disease. Aortic Atherosclerosis (ICD10-I70.0) and Emphysema (ICD10-J43.9). Electronically Signed   By: Delanna Ahmadi M.D.   On: 05/20/2022 11:08   DG Chest Port 1 View  Result Date: 05/04/2022 CLINICAL DATA:   Low oxygen saturation, recent flu. EXAM: PORTABLE CHEST 1 VIEW COMPARISON:  April 24, 2022 FINDINGS: The heart size and mediastinal contours are within normal limits. Mildly decreased lung volumes are seen. Mild atelectasis is noted within the bilateral lung bases. There is no evidence of a pleural effusion or pneumothorax. The visualized skeletal structures are unremarkable. IMPRESSION: Mild bibasilar atelectasis. Electronically Signed   By: Virgina Norfolk M.D.   On: 05/04/2022 04:44    Microbiology: Results for orders placed or performed during the hospital encounter of 05/20/22  Resp panel by RT-PCR (RSV, Flu A&B, Covid) Anterior Nasal Swab     Status: None   Collection Time: 05/20/22  8:57 AM   Specimen: Anterior Nasal Swab  Result Value Ref Range Status   SARS Coronavirus 2 by RT PCR NEGATIVE NEGATIVE Final    Comment: (NOTE) SARS-CoV-2 target nucleic acids are NOT DETECTED.  The SARS-CoV-2 RNA is generally detectable in upper respiratory specimens during the acute phase of infection. The lowest concentration of SARS-CoV-2 viral copies this assay can detect is 138 copies/mL. A negative result does not preclude SARS-Cov-2 infection and should not be used as the sole basis for treatment or other patient management decisions. A negative result may occur with  improper specimen collection/handling, submission of specimen other than nasopharyngeal swab, presence of viral mutation(s) within the areas targeted by this assay, and inadequate number of viral copies(<138 copies/mL). A negative result must be combined with clinical observations, patient history,  and epidemiological information. The expected result is Negative.  Fact Sheet for Patients:  EntrepreneurPulse.com.au  Fact Sheet for Healthcare Providers:  IncredibleEmployment.be  This test is no t yet approved or cleared by the [place] FDA and  has been authorized for detection  and/or diagnosis of SARS-CoV-2 by FDA under an Emergency Use Authorization (EUA). This EUA will remain  in effect (meaning this test can be used) for the duration of the COVID-19 declaration under Section 564(b)(1) of the Act, 21 U.S.C.section 360bbb-3(b)(1), unless the authorization is terminated  or revoked sooner.       Influenza A by PCR NEGATIVE NEGATIVE Final   Influenza B by PCR NEGATIVE NEGATIVE Final    Comment: (NOTE) The Xpert Xpress SARS-CoV-2/FLU/RSV plus assay is intended as an aid in the diagnosis of influenza from Nasopharyngeal swab specimens and should not be used as a sole basis for treatment. Nasal washings and aspirates are unacceptable for Xpert Xpress SARS-CoV-2/FLU/RSV testing.  Fact Sheet for Patients: EntrepreneurPulse.com.au  Fact Sheet for Healthcare Providers: IncredibleEmployment.be  This test is not yet approved or cleared by the [place] FDA and has been authorized for detection and/or diagnosis of SARS-CoV-2 by FDA under an Emergency Use Authorization (EUA). This EUA will remain in effect (meaning this test can be used) for the duration of the COVID-19 declaration under Section 564(b)(1) of the Act, 21 U.S.C. section 360bbb-3(b)(1), unless the authorization is terminated or revoked.     Resp Syncytial Virus by PCR NEGATIVE NEGATIVE Final    Comment: (NOTE) Fact Sheet for Patients: EntrepreneurPulse.com.au  Fact Sheet for Healthcare Providers: IncredibleEmployment.be  This test is not yet approved or cleared by the [place] FDA and has been authorized for detection and/or diagnosis of SARS-CoV-2 by FDA under an Emergency Use Authorization (EUA). This EUA will remain in effect (meaning this test can be used) for the duration of the COVID-19 declaration under Section 564(b)(1) of the Act, 21 U.S.C. section 360bbb-3(b)(1), unless the authorization is terminated  or revoked.  Performed at [facility], [place] [address] [place], [place] [address]   Blood culture (routine x 2)     Status: None (Preliminary result)   Collection Time: 05/20/22 12:00 PM   Specimen: BLOOD  Result Value Ref Range Status   Specimen Description   Final    BLOOD RIGHT ANTECUBITAL Performed at [facility] [address] [place], [place] [address]    Special Requests   Final    BOTTLES DRAWN AEROBIC AND ANAEROBIC Blood Culture results may not be optimal due to an inadequate volume of blood received in culture bottles Performed at [facility] [address] [place], [place] [address]    Culture   Final    NO GROWTH 4 DAYS Performed at [facility], [place] [address] [place], [place] [address]    Report Status PENDING  Incomplete  Blood culture (routine x 2)     Status: None (Preliminary result)   Collection Time: 05/20/22 12:05 PM   Specimen: BLOOD  Result Value Ref Range Status   Specimen Description   Final    BLOOD BLOOD LEFT FOREARM Performed at [facility] [address], [place], [place] [address]    Special Requests   Final    BOTTLES DRAWN AEROBIC ONLY Blood Culture adequate volume Performed at [facility] [address], [place], [place] [address]    Culture   Final    NO GROWTH  4 DAYS Performed at [facility], [place] [address] [place], [place] [address]    Report Status PENDING  Incomplete  MRSA Next Gen by PCR, Nasal     Status: None   Collection Time: 05/22/22  9:35 AM  Result Value Ref Range Status   MRSA by PCR Next Gen NOT DETECTED NOT DETECTED Final    Comment: (NOTE) The GeneXpert MRSA Assay (FDA approved for NASAL specimens only), is one component of a comprehensive MRSA colonization surveillance program. It is not intended to diagnose MRSA infection nor to guide or monitor treatment for MRSA infections. Test performance is  not FDA approved in patients less than 1 years old. Performed at [facility], [place] [address], [place], [place] [address]     Labs: CBC: Recent Labs  Lab 05/20/22 0853  WBC 10.4  HGB 12.4  HCT 40.2  MCV 89.7  PLT 220   Basic Metabolic Panel: Recent Labs  Lab 05/20/22 0853 05/22/22 0615 05/23/22 0621 05/24/22 0547  NA 135 135 135 136  K 3.5 4.0 3.9 3.5  CL 96* 99 97* 98  CO2 '29 23 26 28  '$ GLUCOSE 113* 70 66* 90  BUN '10 9 8 '$ <5*  CREATININE 0.77 0.63 0.64 0.52  CALCIUM 9.2 8.2* 8.6* 8.5*   Liver Function Tests: Recent Labs  Lab 05/20/22 0853  AST 18  ALT 20  ALKPHOS 57  BILITOT 0.8  PROT 8.1  ALBUMIN 3.7   CBG: No results for input(s): "GLUCAP" in the last 168 hours.  Discharge time spent: less than 30 minutes.  Signed: Murray Hodgkins, MD [facility] 05/24/2022

## 2022-05-24 NOTE — Progress Notes (Signed)
[facility] Progress Note     Subjective: CC-  No complaints this morning. Denies abdominal pain, nausea, or vomiting. Tolerating liquids. Passing flatus and had 2 Bms yesterday.  Objective: Vital signs in last 24 hours: Temp:  [98.1 F (36.7 C)-98.4 F (36.9 C)] 98.1 F (36.7 C) (01/09 0524) Pulse Rate:  [86-89] 86 (01/09 0524) Resp:  [19-20] 20 (01/09 0524) BP: (107-126)/(68-83) 126/83 (01/09 0524) SpO2:  [79 %-99 %] 79 % (01/09 0811) Last BM Date : 05/21/22 (very small amount per pt)  Intake/Output from previous day: 01/08 0701 - 01/09 0700 In: 702 [P.O.:600; IV Piggyback:102] Out: -  Intake/Output this shift: No intake/output data recorded.  PE: Gen:  Alert, NAD, pleasant Pulm: rate and effort normal on [place] Abd: soft, ND, NT  Lab Results:  No results for input(s): "WBC", "HGB", "HCT", "PLT" in the last 72 hours. BMET Recent Labs    05/23/22 0621 05/24/22 0547  NA 135 136  K 3.9 3.5  CL 97* 98  CO2 26 28  GLUCOSE 66* 90  BUN 8 <5*  CREATININE 0.64 0.52  CALCIUM 8.6* 8.5*   PT/INR No results for input(s): "LABPROT", "INR" in the last 72 hours. CMP     Component Value Date/Time   NA 136 05/24/2022 0547   NA 140 05/26/2021 0918   K 3.5 05/24/2022 0547   CL 98 05/24/2022 0547   CO2 28 05/24/2022 0547   GLUCOSE 90 05/24/2022 0547   BUN <5 (L) 05/24/2022 0547   BUN 10 05/26/2021 0918   CREATININE 0.52 05/24/2022 0547   CALCIUM 8.5 (L) 05/24/2022 0547   PROT 8.1 05/20/2022 0853   PROT 6.5 05/26/2021 0918   ALBUMIN 3.7 05/20/2022 0853   ALBUMIN 4.2 05/26/2021 0918   AST 18 05/20/2022 0853   ALT 20 05/20/2022 0853   ALKPHOS 57 05/20/2022 0853   BILITOT 0.8 05/20/2022 0853   BILITOT 0.3 05/26/2021 0918   GFRNONAA >60 05/24/2022 0547   GFRAA 106 06/25/2020 1536   Lipase     Component Value Date/Time   LIPASE 26 05/20/2022 0853       Studies/Results: No results found.  Anti-infectives: Anti-infectives (From admission, onward)     Start     Dose/Rate Route Frequency Ordered Stop   05/21/22 1800  vancomycin (VANCOCIN) IVPB 1000 mg/200 mL premix  Status:  Discontinued        1,000 mg 200 mL/hr over 60 Minutes Intravenous Every 24 hours 05/20/22 1412 05/22/22 1141   05/21/22 1600  cefTRIAXone (ROCEPHIN) 1 g in sodium chloride 0.9 % 100 mL IVPB        1 g 200 mL/hr over 30 Minutes Intravenous Every 24 hours 05/21/22 1034     05/20/22 1600  ceFEPIme (MAXIPIME) 2 g in sodium chloride 0.9 % 100 mL IVPB  Status:  Discontinued        2 g 200 mL/hr over 30 Minutes Intravenous Every 8 hours 05/20/22 1410 05/21/22 1034   05/20/22 1400  vancomycin (VANCOREADY) IVPB 1500 mg/300 mL        1,500 mg 150 mL/hr over 120 Minutes Intravenous  Once 05/20/22 1346 05/20/22 1718   05/20/22 1145  cefTRIAXone (ROCEPHIN) 2 g in sodium chloride 0.9 % 100 mL IVPB        2 g 200 mL/hr over 30 Minutes Intravenous  Once 05/20/22 1139 05/20/22 1251   05/20/22 1145  azithromycin (ZITHROMAX) 500 mg in sodium chloride 0.9 % 250 mL IVPB  500 mg 250 mL/hr over 60 Minutes Intravenous  Once 05/20/22 1139 05/20/22 1324        Assessment/Plan SBO - CTA 1/5 shows distended and fecalized loops of distal ileum in the central abdomen with a sharply decompressed transition point in the anterior left hemiabdomen with decompression of the terminal ileum and colon consistent with small bowel obstruction, possibly due to adhesions - unable to place NG tube so patient never had SBO protocol but her obstructive symptoms quickly resolved - Tolerating liquids and having bowel function. Advance to soft diet. [place] for discharge today from surgical standpoint. Follow up with surgery PRN.   FEN - soft diet VTE - lovenox ID - none currently needed   CAD COPD, on 4L [place] chronically - of note she just finished a steroid taper for a recent COPD exacerbation HLD obesity   I reviewed last 24 h vitals and pain scores, last 48 h intake and output, and last 24 h  labs and trends.    LOS: 4 days    [facility] 05/24/2022, 8:49 AM Please see Amion for pager number during day hours 7:00am-4:30pm

## 2022-05-24 NOTE — Plan of Care (Signed)

## 2022-05-25 LAB — CULTURE, BLOOD (ROUTINE X 2)
Culture: NO GROWTH
Culture: NO GROWTH
Special Requests: ADEQUATE

## 2022-05-26 NOTE — Telephone Encounter (Signed)
[name] came by the office on 1/10 to pick up the short term disability form for her hospitalizations on 04/24/22 and 05/04/22.  She and I talked and she let me know it needed to be corrected to add the 05/19/2022 hospitalization as well.  I updated the form and Dr. [name] signed it - she will come pick it up at the [facility] office on 1/12.  The FMLA form from [employer] to extend her FMLA to Feb 2025 was completed and signed by Dr. [name].  I have faxed it to [facility] fax# [phone] and put a copy in the folder with the short term disability form for [name] to pick up at the office.

## 2022-05-27 ENCOUNTER — Other Ambulatory Visit (HOSPITAL_COMMUNITY): Payer: Self-pay

## 2022-05-27 NOTE — Telephone Encounter (Signed)
Returned call to [employer] regarding what information was missing. Per rep, clinical questions were not completed. Completed on phone since we have not received any other forms. Per rep, turnaround is 24-72 hours  Phone: [phone] Case PA # [id number]  Knox Saliva, PharmD, MPH, BCPS, CPP Clinical Pharmacist (Rheumatology and Pulmonology)

## 2022-05-27 NOTE — Telephone Encounter (Signed)
Received fax from [employer] Conservator, museum/gallery) requesting baseline eos count (before significant prednisone use). Patient has been steroid dependent for > 3 years. Submitted screenshots for prednisone dispense history, eos from 05/25/2019 (was 200 then)  Fax: [phone] Phone: [phone] Case # PA # [id number]  Received notification from [facility] regarding a prior authorization for [place]. Authorization has been APPROVED from 05/23/2022 to 11/19/2022. Approval letter sent to scan center.  Unable to run test claim as it rejects that claim has to be run through primary payer first.  Authorization # [id number] Phone # [phone]  Knox Saliva, PharmD, MPH, BCPS, CPP Clinical Pharmacist (Rheumatology and Pulmonology)

## 2022-05-27 NOTE — Telephone Encounter (Signed)
Received a call from [name] stating that they received a form that was incomplete and are asking if this could be resent once this form is completed.

## 2022-05-31 ENCOUNTER — Other Ambulatory Visit: Payer: Self-pay | Admitting: Cardiology

## 2022-05-31 DIAGNOSIS — I2510 Atherosclerotic heart disease of native coronary artery without angina pectoris: Secondary | ICD-10-CM

## 2022-05-31 DIAGNOSIS — E7800 Pure hypercholesterolemia, unspecified: Secondary | ICD-10-CM

## 2022-05-31 DIAGNOSIS — Z789 Other specified health status: Secondary | ICD-10-CM

## 2022-05-31 DIAGNOSIS — G720 Drug-induced myopathy: Secondary | ICD-10-CM

## 2022-05-31 DIAGNOSIS — I25118 Atherosclerotic heart disease of native coronary artery with other forms of angina pectoris: Secondary | ICD-10-CM

## 2022-06-02 ENCOUNTER — Ambulatory Visit: Payer: BC Managed Care – PPO | Admitting: Student

## 2022-06-02 ENCOUNTER — Telehealth: Payer: Self-pay | Admitting: Student

## 2022-06-02 ENCOUNTER — Telehealth: Payer: Self-pay | Admitting: Cardiology

## 2022-06-02 DIAGNOSIS — E7800 Pure hypercholesterolemia, unspecified: Secondary | ICD-10-CM

## 2022-06-02 DIAGNOSIS — I2510 Atherosclerotic heart disease of native coronary artery without angina pectoris: Secondary | ICD-10-CM

## 2022-06-02 DIAGNOSIS — Z789 Other specified health status: Secondary | ICD-10-CM

## 2022-06-02 DIAGNOSIS — I25118 Atherosclerotic heart disease of native coronary artery with other forms of angina pectoris: Secondary | ICD-10-CM

## 2022-06-02 DIAGNOSIS — G720 Drug-induced myopathy: Secondary | ICD-10-CM

## 2022-06-02 MED ORDER — REPATHA SURECLICK 140 MG/ML ~~LOC~~ SOAJ: 140.0000 mg | SUBCUTANEOUS | 1 refills | Status: DC

## 2022-06-02 NOTE — Telephone Encounter (Signed)
Refill sent.

## 2022-06-02 NOTE — Telephone Encounter (Signed)
Spoke with the pt  She states that she received the yupelri but was unable to afford brovana and pulmicort  Will forward to pharm team to see what we can do, not sure if PA needed? thanks

## 2022-06-02 NOTE — Telephone Encounter (Signed)
*  STAT* If patient is at the pharmacy, call can be transferred to refill team.   1. Which medications need to be refilled? (please list name of each medication and dose if known)  Evolocumab (REPATHA SURECLICK) 183 MG/ML SOAJ    2. Which pharmacy/location (including street and city if local pharmacy) is medication to be sent to? [id number] [place],  - [address]   3. Do they need a 30 day or 90 day supply? 90 day

## 2022-06-03 ENCOUNTER — Other Ambulatory Visit (HOSPITAL_COMMUNITY): Payer: Self-pay

## 2022-06-03 MED ORDER — DUPIXENT 300 MG/2ML ~~LOC~~ SOAJ
SUBCUTANEOUS | 0 refills | Status: DC
  Filled 2022-06-03: qty 4, fill #0

## 2022-06-03 NOTE — Telephone Encounter (Signed)
Per test claims both the Brovana and Pulmicort (generics) are covered with the Brovana being the cheaper of the two of around $40 per current test claim.

## 2022-06-03 NOTE — Telephone Encounter (Signed)
Received notification from [facility] regarding a prior authorization for [name]. Authorization has been APPROVED from 05/24/2022 to 11/25/2022. Approval letter sent to scan center.  Per test claim, copay for 28 days supply is $0 when commercial plan is processed as primary and Medicare plan processed as secondary.  Patient can fill through [facility]: [phone]   Authorization # [id number]  Dupixent new start scheduled for 06/07/2022 after appt with Dr. Verlee Monte.  Knox Saliva, PharmD, MPH, BCPS, CPP Clinical Pharmacist (Rheumatology and Pulmonology)
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
## 2022-06-07 ENCOUNTER — Other Ambulatory Visit: Payer: BC Managed Care – PPO | Admitting: Pharmacist

## 2022-06-07 ENCOUNTER — Other Ambulatory Visit (HOSPITAL_COMMUNITY): Payer: Self-pay

## 2022-06-07 ENCOUNTER — Ambulatory Visit: Payer: BC Managed Care – PPO | Admitting: Student

## 2022-06-07 NOTE — Telephone Encounter (Signed)
Patient r/s for Dupixent new start on 06/15/2022. She had to cancel due to transportation  Knox Saliva, PharmD, MPH, BCPS, CPP Clinical Pharmacist (Rheumatology and Pulmonology)

## 2022-06-08 ENCOUNTER — Other Ambulatory Visit: Payer: Self-pay

## 2022-06-08 ENCOUNTER — Telehealth: Payer: Self-pay

## 2022-06-08 DIAGNOSIS — E7800 Pure hypercholesterolemia, unspecified: Secondary | ICD-10-CM

## 2022-06-08 DIAGNOSIS — G720 Drug-induced myopathy: Secondary | ICD-10-CM

## 2022-06-08 DIAGNOSIS — I2510 Atherosclerotic heart disease of native coronary artery without angina pectoris: Secondary | ICD-10-CM

## 2022-06-08 DIAGNOSIS — Z789 Other specified health status: Secondary | ICD-10-CM

## 2022-06-08 DIAGNOSIS — I25118 Atherosclerotic heart disease of native coronary artery with other forms of angina pectoris: Secondary | ICD-10-CM

## 2022-06-08 MED ORDER — REPATHA SURECLICK 140 MG/ML ~~LOC~~ SOAJ: 140.0000 mg | SUBCUTANEOUS | 2 refills | Status: DC

## 2022-06-08 NOTE — Telephone Encounter (Signed)
Pharmacy Patient Advocate Encounter  Prior Authorization for Praluent '150MG'$ /ML has been approved.    KEY# [id number] Effective dates: 1.21.24 through 1.22.25  Please note that Rx Repatha is no longer a covered medication on the patients formulary.

## 2022-06-08 NOTE — Telephone Encounter (Signed)
Prescription refill sent to the patient's pharmacy

## 2022-06-08 NOTE — Telephone Encounter (Signed)
Pt c/o medication issue:  1. Name of Medication:   Evolocumab (REPATHA SURECLICK) 606 MG/ML SOAJ    2. How are you currently taking this medication (dosage and times per day)?   3. Are you having a reaction (difficulty breathing--STAT)?   4. What is your medication issue? Patient states that her insurance is requiring for this refill to be a 90 day supply in order to have the discounts. Requesting 90 day refill sent in.

## 2022-06-10 NOTE — Telephone Encounter (Addendum)
We received a request from [employer] insurance to update the expected end date for patient's work restrictions on FMLA form.  "To be determined" is not acceptable.  I have updated the form to make this end date 06/17/2023 and sent to Dr. [name] for signature.

## 2022-06-10 NOTE — Telephone Encounter (Signed)
Rx previously submitted

## 2022-06-13 ENCOUNTER — Other Ambulatory Visit: Payer: Self-pay | Admitting: Cardiology

## 2022-06-13 DIAGNOSIS — I25118 Atherosclerotic heart disease of native coronary artery with other forms of angina pectoris: Secondary | ICD-10-CM

## 2022-06-13 DIAGNOSIS — E7800 Pure hypercholesterolemia, unspecified: Secondary | ICD-10-CM

## 2022-06-14 ENCOUNTER — Telehealth: Payer: Self-pay | Admitting: Primary Care

## 2022-06-14 ENCOUNTER — Encounter: Payer: Self-pay | Admitting: Internal Medicine

## 2022-06-14 ENCOUNTER — Other Ambulatory Visit: Payer: Self-pay | Admitting: Cardiology

## 2022-06-14 ENCOUNTER — Ambulatory Visit (INDEPENDENT_AMBULATORY_CARE_PROVIDER_SITE_OTHER): Payer: BC Managed Care – PPO | Admitting: Internal Medicine

## 2022-06-14 VITALS — BP 106/62 | Temp 98.3°F | Ht 63.0 in | Wt 158.0 lb

## 2022-06-14 DIAGNOSIS — J449 Chronic obstructive pulmonary disease, unspecified: Secondary | ICD-10-CM | POA: Diagnosis not present

## 2022-06-14 DIAGNOSIS — J9611 Chronic respiratory failure with hypoxia: Secondary | ICD-10-CM

## 2022-06-14 DIAGNOSIS — J441 Chronic obstructive pulmonary disease with (acute) exacerbation: Secondary | ICD-10-CM

## 2022-06-14 DIAGNOSIS — I25118 Atherosclerotic heart disease of native coronary artery with other forms of angina pectoris: Secondary | ICD-10-CM

## 2022-06-14 DIAGNOSIS — E7800 Pure hypercholesterolemia, unspecified: Secondary | ICD-10-CM

## 2022-06-14 LAB — CBC WITH DIFFERENTIAL/PLATELET
Basophils Absolute: 0 10*3/uL (ref 0.0–0.1)
Basophils Relative: 0.9 % (ref 0.0–3.0)
Eosinophils Absolute: 0.1 10*3/uL (ref 0.0–0.7)
Eosinophils Relative: 2.2 % (ref 0.0–5.0)
HCT: 34.5 % — ABNORMAL LOW (ref 36.0–46.0)
Hemoglobin: 11.3 g/dL — ABNORMAL LOW (ref 12.0–15.0)
Lymphocytes Relative: 32 % (ref 12.0–46.0)
Lymphs Abs: 1.7 10*3/uL (ref 0.7–4.0)
MCHC: 32.6 g/dL (ref 30.0–36.0)
MCV: 86.2 fl (ref 78.0–100.0)
Monocytes Absolute: 0.5 10*3/uL (ref 0.1–1.0)
Monocytes Relative: 9.6 % (ref 3.0–12.0)
Neutro Abs: 3 10*3/uL (ref 1.4–7.7)
Neutrophils Relative %: 55.3 % (ref 43.0–77.0)
Platelets: 329 10*3/uL (ref 150.0–400.0)
RBC: 4 Mil/uL (ref 3.87–5.11)
RDW: 15.5 % (ref 11.5–15.5)
WBC: 5.4 10*3/uL (ref 4.0–10.5)

## 2022-06-14 MED ORDER — METHYLPREDNISOLONE ACETATE 80 MG/ML IJ SUSP
80.0000 mg | Freq: Once | INTRAMUSCULAR | Status: AC
  Administered 2022-06-14: 80 mg via INTRAMUSCULAR

## 2022-06-14 MED ORDER — AZITHROMYCIN 250 MG PO TABS: ORAL_TABLET | ORAL | 0 refills | Status: DC

## 2022-06-14 MED ORDER — PREDNISONE 10 MG PO TABS: ORAL_TABLET | ORAL | 0 refills | Status: DC

## 2022-06-14 NOTE — Telephone Encounter (Signed)
Pt scheduled for an acute office visit today 06/14/22. Nothing further needed at this time.

## 2022-06-14 NOTE — Telephone Encounter (Signed)
Primary Pulmonologist: Verlee Monte  Last office visit and with whom: 05/11/2022  What do we see them for (pulmonary problems): Asthma-COPD  Last OV assessment/plan:   COPD Gold E  # OCS dependent asthma-COPD overlap  # Chronic hypoxic hypercapnic respiratory failure Eos <300 but frequently exposed to steroids.    Plan: - Finish steroid taper - mix brovana (formoterol) and yupelri (revefenacin) together in neb first thing in the morning. Do this combo neb. - THEN immediately after do budesonide neb. - In evening do brovana (formoterol) neb ALONE followed by budesonide neb - albuterol nebs or inhaler as needed - this is rescue medication. - STOP duonebs (albuterol -ipratropium) - continue singulair - we will work on getting you set up for dupixent for eosinophilic asthma/COPD  Was appointment offered to patient (explain)?  Appt scheduled with MR   Reason for call: wheezing, wanted a sooner appt  (examples of things to ask: : When did symptoms start? Fever? Cough? Productive? Color to sputum? More sputum than usual? Wheezing? Have you needed increased oxygen? Are you taking your respiratory medications? What over the counter measures have you tried?)  Allergies  Allergen Reactions   Amoxicillin Shortness Of Breath   Tylenol [Acetaminophen] Shortness Of Breath   Statins    Zetia [Ezetimibe]     Immunization History  Administered Date(s) Administered   Influenza Inj Mdck Quad Pf 01/24/2017   Influenza Split 02/09/2012, 02/14/2015   Influenza Whole 12/24/2017   Influenza,inj,Quad PF,6+ Mos 01/14/2017   Influenza-Unspecified 05/16/2012, 12/31/2018   PFIZER(Purple Top)SARS-COV-2 Vaccination 08/19/2019, 09/13/2019   Pneumococcal Polysaccharide-23 09/11/2011, 04/15/2012
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
## 2022-06-14 NOTE — Addendum Note (Signed)
Addended by: Gavin Potters R on: 06/14/2022 03:07 PM   Modules accepted: Orders

## 2022-06-14 NOTE — Addendum Note (Signed)
Addended by: Suzzanne Cloud E on: 06/14/2022 03:13 PM   Modules accepted: Orders
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
## 2022-06-14 NOTE — Patient Instructions (Signed)
ICD-10-CM   1. COPD exacerbation ([place])  J44.1     2. COPD, severe ([place])  J44.9     3. Chronic respiratory failure with hypoxia (HCC)  J96.11       You are having a COPD exacerbation  Plan -Do blood test today for CBC with differential, RAST allergy panel and IgE -Hold off Dupixent start 06/15/2022  -Wait at least a few weeks - Send out respiratory viral panel for flu, COVID, RSV  -Results will be back in a few days -Do azithromycin Z-Pak -Take IM Depo-Medrol 80 mg x 1 in the office today -> then from tomorrow- Take prednisone 40 mg daily x 2 days, then '20mg'$  daily x 2 days, then '10mg'$  daily x 2 days, then '5mg'$  daily x 2 days and stop -Continue your other regular medications -Go to the ER if worse -Do pulmonary function test in the next few months  Follow-up - 2 months with Dr. [name] your primary pulmonologist or sooner if needed

## 2022-06-14 NOTE — Telephone Encounter (Signed)
PT has appt tomorrow. Calling now with SOB and wheezing. Pls call to advise if we can fit in. You can hear wheeze over the phone.

## 2022-06-15 ENCOUNTER — Other Ambulatory Visit: Payer: BC Managed Care – PPO | Admitting: Pharmacist

## 2022-06-15 ENCOUNTER — Ambulatory Visit: Payer: BC Managed Care – PPO | Admitting: Primary Care

## 2022-06-15 LAB — IGE: IgE (Immunoglobulin E), Serum: 22 kU/L (ref <=114)

## 2022-06-15 NOTE — Telephone Encounter (Signed)
Patient cancelled Dupixent appt per rec of Dr. Chase Caller. He recommended waiting for several weeks. Will f/u  Knox Saliva, PharmD, MPH, BCPS, CPP Clinical Pharmacist (Rheumatology and Pulmonology)

## 2022-06-15 NOTE — Telephone Encounter (Signed)
Per Dr Shirlee More, ok to change patient from Praluent to Repatha 140 mg every 14 days. Rx sent to  [id number] [address]

## 2022-06-16 ENCOUNTER — Telehealth: Payer: Self-pay | Admitting: Internal Medicine

## 2022-06-16 ENCOUNTER — Telehealth: Payer: Self-pay | Admitting: Student

## 2022-06-16 ENCOUNTER — Telehealth: Payer: Self-pay | Admitting: Pulmonary Disease

## 2022-06-16 LAB — RESPIRATORY ALLERGY PROFILE REGION II ~~LOC~~

## 2022-06-16 LAB — COVID-19, FLU A+B AND RSV
Influenza A, NAA: NOT DETECTED
Influenza B, NAA: NOT DETECTED
RSV, NAA: NOT DETECTED
SARS-CoV-2, NAA: DETECTED — AB

## 2022-06-16 LAB — INTERPRETATION:

## 2022-06-16 MED ORDER — NIRMATRELVIR/RITONAVIR (PAXLOVID)TABLET: 3.0000 | ORAL_TABLET | Freq: Two times a day (BID) | ORAL | 0 refills | Status: AC

## 2022-06-16 NOTE — Telephone Encounter (Signed)
Already addressed in another encounter. Nothing further needed
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
## 2022-06-16 NOTE — Telephone Encounter (Signed)
cOVID positive Has normal renal function  Plan  - ask her if she iis feeling better from office visit? - send paxlovid but only after ensuring she is not on any of the meds below   - PAXLOVID - Paxlovid (nirmatelvir 300/Ritonavir100) - BID x 5 days  DO NOT SEND IF ON:  alfuzosin, amiodarone, clozapine, colchicine, dihydroergotamine, dronedarone, ergotamine, flecainide, lovastatin, lurasidone, methylergonovine, midazolam [oral], pethidine, pimozide, propafenone, propoxyphene, quinidine, ranolazine, sildenafil simvastatin, triazolam). Ifdapalutamide, carbamazepine, phenobarbital, phenytoin, rifampin, St John's wort)    LMK If she is on any of the above meds   Allergies  Allergen Reactions   Amoxicillin Shortness Of Breath   Tylenol [Acetaminophen] Shortness Of Breath   Statins    Zetia [Ezetimibe]       Current Outpatient Medications:    albuterol (VENTOLIN HFA) 108 (90 Base) MCG/ACT inhaler, Inhale 2 puffs into the lungs every 4 (four) hours as needed for wheezing or shortness of breath., Disp: 18 g, Rfl: 5   arformoterol (BROVANA) 15 MCG/2ML NEBU, Take 2 mLs (15 mcg total) by nebulization 2 (two) times daily. (Patient not taking: Reported on 06/14/2022), Disp: 120 mL, Rfl: 12   azithromycin (ZITHROMAX Z-PAK) 250 MG tablet, Take 2 tabs today, then 1 tab until gone, Disp: 6 each, Rfl: 0   Evolocumab (REPATHA SURECLICK) 008 MG/ML SOAJ, Inject 140 mg into the skin every 14 (fourteen) days., Disp: 2 mL, Rfl: 6   famotidine (PEPCID) 20 MG tablet, Take 1 tablet (20 mg total) by mouth daily., Disp: 30 tablet, Rfl: 0   guaiFENesin-dextromethorphan (ROBITUSSIN DM) 100-10 MG/5ML syrup, Take 10 mLs by mouth every 4 (four) hours as needed for cough., Disp: 118 mL, Rfl: 0   hydrOXYzine (ATARAX) 10 MG tablet, Take 1 tablet (10 mg total) by mouth 3 (three) times daily as needed for anxiety., Disp: 30 tablet, Rfl: 0   ipratropium-albuterol (DUONEB) 0.5-2.5 (3) MG/3ML SOLN, Take 3 mLs by nebulization  in the morning, at noon, in the evening, and at bedtime., Disp: , Rfl:    montelukast (SINGULAIR) 10 MG tablet, TAKE 1 TABLET BY MOUTH EVERYDAY AT BEDTIME (Patient taking differently: Take 10 mg by mouth at bedtime.), Disp: 90 tablet, Rfl: 1   nitroGLYCERIN (NITROSTAT) 0.4 MG SL tablet, Place 1 tablet (0.4 mg total) under the tongue every 5 (five) minutes as needed for chest pain., Disp: 25 tablet, Rfl: 1   OXYGEN, Inhale 4 L into the lungs at bedtime., Disp: , Rfl:    predniSONE (DELTASONE) 10 MG tablet, Take 4 tabs for 2 days, then 3 tabs for 2 days, 2 tabs for 2 days, then 1 tab for 2 days, then stop., Disp: 20 tablet, Rfl: 0   revefenacin (YUPELRI) 175 MCG/3ML nebulizer solution, Take 3 mLs (175 mcg total) by nebulization daily. (Patient not taking: Reported on 06/14/2022), Disp: 90 mL, Rfl: 1

## 2022-06-16 NOTE — Telephone Encounter (Signed)
Patient called regarding her positive covid results.  She would like to know what she should do and what kind of medication she needs to take for her positive covid test.  Please advise and call patient to discuss.  CB# [phone]

## 2022-06-16 NOTE — Telephone Encounter (Signed)
Rechecked my list and website -> azithromycin not listed as an interaction with paxlovid

## 2022-06-16 NOTE — Telephone Encounter (Signed)
Pt calling about meds sent in today. Wonders if it is OK to take with  her other medications. Pls call @ [phone]

## 2022-06-16 NOTE — Telephone Encounter (Signed)
Called and spoke to patient about medication sent in. Verified understanding. Nothing further needed

## 2022-06-16 NOTE — Telephone Encounter (Signed)
Called patient and she is wondering if it is safe to take the paxlovid with   Zpak and prednisone  I told her yes it was safe for her to take along with the paxlovid but I told her I would check with MR first  Please advise sir

## 2022-06-17 ENCOUNTER — Ambulatory Visit: Payer: BC Managed Care – PPO | Attending: Cardiology | Admitting: Cardiology

## 2022-06-17 NOTE — Telephone Encounter (Signed)
Dr. [name] signed form and I faxed to [place] fax# [phone].  Hard copy mailed to patient.

## 2022-06-17 NOTE — Telephone Encounter (Signed)
Called and spoke with pt letting her know the info per MR and she verbalized understanding. Nothing further needed. 

## 2022-06-21 ENCOUNTER — Encounter: Payer: Self-pay | Admitting: Cardiology

## 2022-06-27 ENCOUNTER — Telehealth: Payer: Self-pay | Admitting: Internal Medicine

## 2022-06-27 NOTE — Telephone Encounter (Signed)
Patient dropped off form from employer, [employer], requesting update and estimated return to work date.  I spoke to the patient on Friday and she stated her estimated return to work date is 08/14/2022.  She also asked that I call her when the form is signed and she will come by to pick it up.  Sent form to [place] for signature.

## 2022-06-29 NOTE — Telephone Encounter (Signed)
Signed short term disability statement with return to work date of 08/14/22 was faxed to [facility]   fax# [phone].  Patient was called but I was unable to leave a voice message because her mail box is full.  I've put a copy of the form at [employer] and, as discussed with her yesterday, she will come by the office to pick it up.

## 2022-06-30 NOTE — Telephone Encounter (Signed)
Patient tested positive for COVID on 06/16/2022. Will wait an additional week to ensure symptoms have resolved prior to r/s DUPIXENT new start appt  Knox Saliva, PharmD, MPH, BCPS, CPP Clinical Pharmacist (Rheumatology and Pulmonology)

## 2022-07-03 ENCOUNTER — Other Ambulatory Visit: Payer: Self-pay | Admitting: Student

## 2022-07-03 ENCOUNTER — Other Ambulatory Visit: Payer: Self-pay | Admitting: Cardiology

## 2022-07-04 NOTE — Telephone Encounter (Signed)
Refill sent to pharmacy.   

## 2022-07-10 ENCOUNTER — Other Ambulatory Visit: Payer: Self-pay | Admitting: Pulmonary Disease
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
## 2022-07-14 ENCOUNTER — Ambulatory Visit: Payer: BC Managed Care – PPO | Admitting: Cardiology

## 2022-07-15 ENCOUNTER — Other Ambulatory Visit: Payer: Self-pay | Admitting: Cardiology

## 2022-07-18 NOTE — Telephone Encounter (Signed)
Pt has f/u appt on 07/26/22 with Dr. Verlee Monte. If able, we could start patient on Dupixent after that OV. Left VM for pt requesting call.  Knox Saliva, PharmD, MPH, BCPS, CPP Clinical Pharmacist (Rheumatology and Pulmonology)

## 2022-07-20 ENCOUNTER — Telehealth: Payer: Self-pay | Admitting: Internal Medicine

## 2022-07-20 NOTE — Telephone Encounter (Signed)
Patient called this morning - she is planning to return to work on Monday, March 11 and will need a letter from Dr. Chase Caller stating it is okay for her to go back to work - 8 hours per day.   She works at [employer] and her shift stars at [employer], so she will need to hand carry the letter with her the first day and it will be given to her HR department.  I've reached out to MR's nurse to get this letter written so patient can pick it up on Friday. I will also fax a copy of the letter to the HR Dept on Friday.

## 2022-07-20 NOTE — Telephone Encounter (Signed)
Dr. Golden Pop nurse generated the letter for return to work on 3/11.  I called patient and let her know to pick her copy up at the front desk.  Also faxed copy to [employer] - Judie Bonus - fax# [phone]

## 2022-07-21 ENCOUNTER — Ambulatory Visit: Payer: BC Managed Care – PPO | Admitting: Pulmonary Disease

## 2022-07-22 ENCOUNTER — Other Ambulatory Visit: Payer: Self-pay | Admitting: Student

## 2022-07-22 NOTE — Telephone Encounter (Signed)
ATC patient regarding Dupixent new start. Unable to reach. Left VM advising if interested in started Dupixent after OV on 07/26/22 she can let Dr. Verlee Monte know. Otherwise she can call me back directly to schedule another day. Will await f/u  Knox Saliva, PharmD, MPH, BCPS, CPP Clinical Pharmacist (Rheumatology and Pulmonology)
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
## 2022-07-26 ENCOUNTER — Telehealth: Payer: Self-pay | Admitting: Student

## 2022-07-26 ENCOUNTER — Ambulatory Visit (INDEPENDENT_AMBULATORY_CARE_PROVIDER_SITE_OTHER): Payer: BC Managed Care – PPO | Admitting: Student

## 2022-07-26 ENCOUNTER — Encounter: Payer: Self-pay | Admitting: Student

## 2022-07-26 VITALS — BP 128/80 | HR 124 | Temp 98.6°F | Ht 63.0 in | Wt 154.0 lb

## 2022-07-26 DIAGNOSIS — J9611 Chronic respiratory failure with hypoxia: Secondary | ICD-10-CM

## 2022-07-26 DIAGNOSIS — J441 Chronic obstructive pulmonary disease with (acute) exacerbation: Secondary | ICD-10-CM

## 2022-07-26 DIAGNOSIS — R0600 Dyspnea, unspecified: Secondary | ICD-10-CM

## 2022-07-26 MED ORDER — BREZTRI AEROSPHERE 160-9-4.8 MCG/ACT IN AERO: 2.0000 | INHALATION_SPRAY | Freq: Two times a day (BID) | RESPIRATORY_TRACT | 11 refills | Status: DC

## 2022-07-26 MED ORDER — AZITHROMYCIN 250 MG PO TABS: ORAL_TABLET | ORAL | 0 refills | Status: DC

## 2022-07-26 MED ORDER — BREZTRI AEROSPHERE 160-9-4.8 MCG/ACT IN AERO: 2.0000 | INHALATION_SPRAY | Freq: Two times a day (BID) | RESPIRATORY_TRACT | 0 refills | Status: DC

## 2022-07-26 MED ORDER — PREDNISONE 10 MG PO TABS: ORAL_TABLET | ORAL | 0 refills | Status: DC

## 2022-07-26 MED ORDER — SPACER/AERO-HOLDING CHAMBERS DEVI: 0 refills | Status: DC

## 2022-07-26 NOTE — Patient Instructions (Addendum)
-   Prednisone taper, azithromycin - START breztri 2 puffs twice daily WITH spacer rinse mouth after each use - ALBUTEROL can use as needed as rescue inhaler OR nebulizer - If still feeling poorly despite use of albuterol can try duoneb (ipratropium-albuterol) - see you in 4 weeks or sooner if need be!

## 2022-07-26 NOTE — Telephone Encounter (Signed)
saw dr. Verlee Monte today and need the alberol soultion called into the [facility] on [address]  she said he was suppose to giver her a steroid shot 2 but he didn't give it to her

## 2022-07-28 NOTE — Telephone Encounter (Signed)
Per last OV note, deferring Dupiexnt start for now possibly until next visit on 08/31/22. Will continue to f/u  Knox Saliva, PharmD, MPH, BCPS, CPP Clinical Pharmacist (Rheumatology and Pulmonology)

## 2022-07-28 NOTE — Telephone Encounter (Signed)
Called patient but call went straight to VM. Left message for patient to call back.  

## 2022-07-28 NOTE — Telephone Encounter (Signed)
Patient is returning phone call. Patient phone number is [phone].

## 2022-07-28 NOTE — Telephone Encounter (Signed)
ATC LVMTCB x 1  

## 2022-08-07 ENCOUNTER — Inpatient Hospital Stay (HOSPITAL_COMMUNITY)
Admission: EM | Admit: 2022-08-07 | Discharge: 2022-08-14 | DRG: 207 | Disposition: A | Discharge status: Home / Self Care | Payer: BC Managed Care – PPO | Attending: Pulmonary Disease | Admitting: Pulmonary Disease

## 2022-08-07 ENCOUNTER — Encounter (HOSPITAL_COMMUNITY): Payer: Self-pay

## 2022-08-07 ENCOUNTER — Emergency Department (HOSPITAL_COMMUNITY): Payer: BC Managed Care – PPO

## 2022-08-07 DIAGNOSIS — I21A1 Myocardial infarction type 2: Secondary | ICD-10-CM | POA: Diagnosis present

## 2022-08-07 DIAGNOSIS — E785 Hyperlipidemia, unspecified: Secondary | ICD-10-CM | POA: Diagnosis present

## 2022-08-07 DIAGNOSIS — R739 Hyperglycemia, unspecified: Secondary | ICD-10-CM

## 2022-08-07 DIAGNOSIS — Z560 Unemployment, unspecified: Secondary | ICD-10-CM

## 2022-08-07 DIAGNOSIS — J9621 Acute and chronic respiratory failure with hypoxia: Secondary | ICD-10-CM | POA: Diagnosis present

## 2022-08-07 DIAGNOSIS — J9602 Acute respiratory failure with hypercapnia: Secondary | ICD-10-CM | POA: Diagnosis present

## 2022-08-07 DIAGNOSIS — I10 Essential (primary) hypertension: Secondary | ICD-10-CM | POA: Diagnosis present

## 2022-08-07 DIAGNOSIS — T8383XA Hemorrhage of genitourinary prosthetic devices, implants and grafts, initial encounter: Secondary | ICD-10-CM | POA: Diagnosis not present

## 2022-08-07 DIAGNOSIS — I959 Hypotension, unspecified: Secondary | ICD-10-CM | POA: Diagnosis present

## 2022-08-07 DIAGNOSIS — I700 Atherosclerosis of aorta: Secondary | ICD-10-CM | POA: Diagnosis present

## 2022-08-07 DIAGNOSIS — J441 Chronic obstructive pulmonary disease with (acute) exacerbation: Secondary | ICD-10-CM | POA: Diagnosis not present

## 2022-08-07 DIAGNOSIS — T17890A Other foreign object in other parts of respiratory tract causing asphyxiation, initial encounter: Secondary | ICD-10-CM | POA: Diagnosis not present

## 2022-08-07 DIAGNOSIS — J9622 Acute and chronic respiratory failure with hypercapnia: Secondary | ICD-10-CM | POA: Diagnosis present

## 2022-08-07 DIAGNOSIS — E162 Hypoglycemia, unspecified: Secondary | ICD-10-CM | POA: Diagnosis not present

## 2022-08-07 DIAGNOSIS — Z79899 Other long term (current) drug therapy: Secondary | ICD-10-CM

## 2022-08-07 DIAGNOSIS — T4275XA Adverse effect of unspecified antiepileptic and sedative-hypnotic drugs, initial encounter: Secondary | ICD-10-CM | POA: Diagnosis present

## 2022-08-07 DIAGNOSIS — Z9981 Dependence on supplemental oxygen: Secondary | ICD-10-CM

## 2022-08-07 DIAGNOSIS — Z886 Allergy status to analgesic agent status: Secondary | ICD-10-CM

## 2022-08-07 DIAGNOSIS — Z880 Allergy status to penicillin: Secondary | ICD-10-CM

## 2022-08-07 DIAGNOSIS — I2510 Atherosclerotic heart disease of native coronary artery without angina pectoris: Secondary | ICD-10-CM | POA: Diagnosis present

## 2022-08-07 DIAGNOSIS — J9601 Acute respiratory failure with hypoxia: Secondary | ICD-10-CM

## 2022-08-07 DIAGNOSIS — B9781 Human metapneumovirus as the cause of diseases classified elsewhere: Secondary | ICD-10-CM | POA: Diagnosis present

## 2022-08-07 DIAGNOSIS — Y846 Urinary catheterization as the cause of abnormal reaction of the patient, or of later complication, without mention of misadventure at the time of the procedure: Secondary | ICD-10-CM | POA: Diagnosis not present

## 2022-08-07 DIAGNOSIS — D72829 Elevated white blood cell count, unspecified: Secondary | ICD-10-CM | POA: Diagnosis present

## 2022-08-07 DIAGNOSIS — T380X5A Adverse effect of glucocorticoids and synthetic analogues, initial encounter: Secondary | ICD-10-CM | POA: Diagnosis present

## 2022-08-07 DIAGNOSIS — Y731 Therapeutic (nonsurgical) and rehabilitative gastroenterology and urology devices associated with adverse incidents: Secondary | ICD-10-CM | POA: Diagnosis not present

## 2022-08-07 DIAGNOSIS — G9349 Other encephalopathy: Secondary | ICD-10-CM | POA: Diagnosis present

## 2022-08-07 DIAGNOSIS — Z87891 Personal history of nicotine dependence: Secondary | ICD-10-CM

## 2022-08-07 DIAGNOSIS — D509 Iron deficiency anemia, unspecified: Secondary | ICD-10-CM | POA: Diagnosis present

## 2022-08-07 DIAGNOSIS — I5181 Takotsubo syndrome: Secondary | ICD-10-CM | POA: Diagnosis present

## 2022-08-07 DIAGNOSIS — K219 Gastro-esophageal reflux disease without esophagitis: Secondary | ICD-10-CM | POA: Diagnosis present

## 2022-08-07 DIAGNOSIS — Z7951 Long term (current) use of inhaled steroids: Secondary | ICD-10-CM

## 2022-08-07 DIAGNOSIS — F419 Anxiety disorder, unspecified: Secondary | ICD-10-CM | POA: Diagnosis present

## 2022-08-07 DIAGNOSIS — Z8249 Family history of ischemic heart disease and other diseases of the circulatory system: Secondary | ICD-10-CM

## 2022-08-07 DIAGNOSIS — N179 Acute kidney failure, unspecified: Secondary | ICD-10-CM

## 2022-08-07 DIAGNOSIS — Z825 Family history of asthma and other chronic lower respiratory diseases: Secondary | ICD-10-CM

## 2022-08-07 DIAGNOSIS — E8729 Other acidosis: Secondary | ICD-10-CM | POA: Diagnosis present

## 2022-08-07 DIAGNOSIS — Z888 Allergy status to other drugs, medicaments and biological substances status: Secondary | ICD-10-CM

## 2022-08-07 DIAGNOSIS — Z1152 Encounter for screening for COVID-19: Secondary | ICD-10-CM

## 2022-08-07 DIAGNOSIS — Z823 Family history of stroke: Secondary | ICD-10-CM

## 2022-08-07 LAB — CBC WITH DIFFERENTIAL/PLATELET
Abs Immature Granulocytes: 0 10*3/uL (ref 0.00–0.07)
Basophils Absolute: 0 10*3/uL (ref 0.0–0.1)
Basophils Relative: 0 %
Eosinophils Absolute: 0.1 10*3/uL (ref 0.0–0.5)
Eosinophils Relative: 1 %
HCT: 40.7 % (ref 36.0–46.0)
Hemoglobin: 11.6 g/dL — ABNORMAL LOW (ref 12.0–15.0)
Lymphocytes Relative: 36 %
Lymphs Abs: 5.3 10*3/uL — ABNORMAL HIGH (ref 0.7–4.0)
MCH: 27.4 pg (ref 26.0–34.0)
MCHC: 28.5 g/dL — ABNORMAL LOW (ref 30.0–36.0)
MCV: 96.2 fL (ref 80.0–100.0)
Monocytes Absolute: 0.6 10*3/uL (ref 0.1–1.0)
Monocytes Relative: 4 %
Neutro Abs: 8.6 10*3/uL — ABNORMAL HIGH (ref 1.7–7.7)
Neutrophils Relative %: 59 %
Platelets: 308 10*3/uL (ref 150–400)
RBC: 4.23 MIL/uL (ref 3.87–5.11)
RDW: 15.8 % — ABNORMAL HIGH (ref 11.5–15.5)
WBC: 14.6 10*3/uL — ABNORMAL HIGH (ref 4.0–10.5)
nRBC: 0 % (ref 0.0–0.2)
nRBC: 0 /100 WBC

## 2022-08-07 LAB — BASIC METABOLIC PANEL
Anion gap: 11 (ref 5–15)
BUN: 19 mg/dL (ref 8–23)
CO2: 34 mmol/L — ABNORMAL HIGH (ref 22–32)
Calcium: 9.2 mg/dL (ref 8.9–10.3)
Chloride: 95 mmol/L — ABNORMAL LOW (ref 98–111)
Creatinine, Ser: 1.26 mg/dL — ABNORMAL HIGH (ref 0.44–1.00)
GFR, Estimated: 48 mL/min — ABNORMAL LOW (ref >60)
Glucose, Bld: 258 mg/dL — ABNORMAL HIGH (ref 70–99)
Potassium: 3.8 mmol/L (ref 3.5–5.1)
Sodium: 140 mmol/L (ref 135–145)

## 2022-08-07 LAB — LACTIC ACID, PLASMA: Lactic Acid, Venous: 2.3 mmol/L (ref 0.5–1.9)

## 2022-08-07 LAB — RESP PANEL BY RT-PCR (RSV, FLU A&B, COVID)  RVPGX2
Influenza A by PCR: NEGATIVE
Influenza B by PCR: NEGATIVE
Resp Syncytial Virus by PCR: NEGATIVE
SARS Coronavirus 2 by RT PCR: NEGATIVE

## 2022-08-07 LAB — TROPONIN I (HIGH SENSITIVITY): Troponin I (High Sensitivity): 60 ng/L — ABNORMAL HIGH (ref <18)

## 2022-08-07 LAB — BRAIN NATRIURETIC PEPTIDE: B Natriuretic Peptide: 185.2 pg/mL — ABNORMAL HIGH (ref 0.0–100.0)

## 2022-08-07 MED ORDER — PROPOFOL 1000 MG/100ML IV EMUL
0.0000 ug/kg/min | INTRAVENOUS | Status: DC
  Administered 2022-08-07: 5 ug/kg/min via INTRAVENOUS
  Administered 2022-08-08: 20 ug/kg/min via INTRAVENOUS
  Administered 2022-08-08: 30 ug/kg/min via INTRAVENOUS
  Administered 2022-08-08: 40 ug/kg/min via INTRAVENOUS
  Administered 2022-08-08: 50 ug/kg/min via INTRAVENOUS
  Filled 2022-08-07 (×6): qty 100

## 2022-08-07 MED ORDER — ROCURONIUM BROMIDE 10 MG/ML (PF) SYRINGE
PREFILLED_SYRINGE | INTRAVENOUS | Status: AC
  Administered 2022-08-07: 70 mg
  Filled 2022-08-07: qty 10

## 2022-08-07 MED ORDER — LACTATED RINGERS IV BOLUS
1000.0000 mL | Freq: Once | INTRAVENOUS | Status: AC
  Administered 2022-08-07: 1000 mL via INTRAVENOUS

## 2022-08-07 MED ORDER — FENTANYL CITRATE PF 50 MCG/ML IJ SOSY: 50.0000 ug | PREFILLED_SYRINGE | Freq: Once | INTRAMUSCULAR | Status: AC

## 2022-08-07 MED ORDER — FENTANYL 2500MCG IN NS 250ML (10MCG/ML) PREMIX INFUSION
0.0000 ug/h | INTRAVENOUS | Status: DC
  Administered 2022-08-07: 25 ug/h via INTRAVENOUS
  Filled 2022-08-07: qty 250

## 2022-08-07 MED ORDER — ETOMIDATE 2 MG/ML IV SOLN
INTRAVENOUS | Status: AC
  Administered 2022-08-07: 20 mg
  Filled 2022-08-07: qty 10

## 2022-08-07 MED ORDER — IPRATROPIUM-ALBUTEROL 0.5-2.5 (3) MG/3ML IN SOLN: 3.0000 mL | RESPIRATORY_TRACT | Status: DC

## 2022-08-07 MED ORDER — FENTANYL CITRATE PF 50 MCG/ML IJ SOSY
PREFILLED_SYRINGE | INTRAMUSCULAR | Status: AC
  Administered 2022-08-07: 50 ug via INTRAVENOUS
  Filled 2022-08-07: qty 1

## 2022-08-07 MED ORDER — IPRATROPIUM-ALBUTEROL 0.5-2.5 (3) MG/3ML IN SOLN
6.0000 mL | Freq: Once | RESPIRATORY_TRACT | Status: AC
  Administered 2022-08-08: 6 mL via RESPIRATORY_TRACT
  Filled 2022-08-07: qty 6

## 2022-08-07 NOTE — Progress Notes (Signed)
Critical ABG results reported. Vent settings adjusted and documented.   pH:  7.18 Co2:  97.2 Po2:  142 HCo3:   37 So2:     98

## 2022-08-07 NOTE — ED Provider Notes (Signed)
[facility] Provider Note   CSN: [id number] Arrival date & time: 08/07/22  2207     History {Add pertinent medical, surgical, social history, OB history to HPI:1} Chief Complaint  Patient presents with   Respiratory Distress    [name] is a 62 y.o. female with PMH notable for HLD, HTN, COPD with chronic hypoxia, baseline is 5 L of oxygen at home.  Patient called EMS due to respiratory distress on EMS arrival patient was progressively gasping for air until she was eventually unresponsive.  She was then placed on BVM and given 2 DuoNeb treatments, 1 albuterol 2 g of mag and 125 mg of Solu-Medrol with EMS.    HPI     Home Medications Prior to Admission medications   Medication Sig Start Date End Date Taking? Authorizing Provider  albuterol (VENTOLIN HFA) 108 (90 Base) MCG/ACT inhaler Inhale 2 puffs into the lungs every 4 (four) hours as needed for wheezing or shortness of breath. 04/25/22   Dwyane Dee, MD  azithromycin (ZITHROMAX) 250 MG tablet 2 tablets today, then 1 tablet daily till gone 07/26/22   Maryjane Hurter, MD  Budeson-Glycopyrrol-Formoterol (BREZTRI AEROSPHERE) 160-9-4.8 MCG/ACT AERO Inhale 2 puffs into the lungs in the morning and at bedtime. 07/26/22   Maryjane Hurter, MD  Budeson-Glycopyrrol-Formoterol (BREZTRI AEROSPHERE) 160-9-4.8 MCG/ACT AERO Inhale 2 puffs into the lungs in the morning and at bedtime. 07/26/22   Maryjane Hurter, MD  Evolocumab (REPATHA SURECLICK) [id number] MG/ML SOAJ Inject 140 mg into the skin every 14 (fourteen) days. 06/15/22   Richardo Priest, MD  famotidine (PEPCID) 20 MG tablet TAKE 1 TABLET BY MOUTH EVERY DAY 07/04/22   Richardo Priest, MD  guaiFENesin-dextromethorphan (ROBITUSSIN DM) 100-10 MG/5ML syrup Take 10 mLs by mouth every 4 (four) hours as needed for cough. 05/08/22   Hosie Poisson, MD  hydrOXYzine (ATARAX) 10 MG tablet Take 1 tablet (10 mg total) by mouth 3 (three) times daily as  needed for anxiety. 05/08/22   Hosie Poisson, MD  ipratropium-albuterol (DUONEB) 0.5-2.5 (3) MG/3ML SOLN Take 3 mLs by nebulization in the morning, at noon, in the evening, and at bedtime.    [provider]  montelukast (SINGULAIR) 10 MG tablet TAKE 1 TABLET BY MOUTH EVERYDAY AT BEDTIME Patient taking differently: Take 10 mg by mouth at bedtime. 03/08/22   Mannam, Hart Robinsons, MD  nitroGLYCERIN (NITROSTAT) 0.4 MG SL tablet Place 1 tablet (0.4 mg total) under the tongue every 5 (five) minutes as needed for chest pain. 06/21/21   Richardo Priest, MD  OXYGEN Inhale 4 L into the lungs at bedtime.    [provider]  predniSONE (DELTASONE) 10 MG tablet Take 4 tabs by mouth for 3 days, then 3 for 3 days, 2 for 3 days, 1 for 3 days and stop 07/26/22   Maryjane Hurter, MD  Spacer/Aero-Holding Josiah Lobo DEVI Use as directed 07/26/22   Maryjane Hurter, MD      Allergies    Amoxicillin, Tylenol [acetaminophen], Statins, and Zetia [ezetimibe]    Review of Systems   Review of Systems See HPI  Physical Exam Updated Vital Signs BP 109/85   Pulse (!) 125   Temp 98.4 F (36.9 C)   Resp 13   Ht 5\' 3"  (1.6 m)   Wt 69.8 kg   SpO2 100%   BMI 27.26 kg/m  Physical Exam Vitals and nursing note reviewed.  Constitutional:      General: She  is in acute distress.     Appearance: She is well-developed.  HENT:     Head: Normocephalic and atraumatic.     Mouth/Throat:     Mouth: Mucous membranes are moist.     Pharynx: Oropharynx is clear.  Eyes:     Conjunctiva/sclera: Conjunctivae normal.     Pupils: Pupils are equal, round, and reactive to light.  Cardiovascular:     Rate and Rhythm: Regular rhythm. Tachycardia present.     Heart sounds: Normal heart sounds. No murmur heard. Pulmonary:     Effort: Respiratory distress present.     Breath sounds: Wheezing (Right side greater than left side) present.     Comments: Diminished lung sounds throughout Abdominal:     General: There is  no distension.     Palpations: Abdomen is soft.  Musculoskeletal:     Cervical back: Neck supple.  Skin:    General: Skin is warm and dry.     Capillary Refill: Capillary refill takes less than 2 seconds.     Findings: No lesion.  Neurological:     Mental Status: She is alert.  Psychiatric:        Mood and Affect: Mood normal.     ED Results / Procedures / Treatments   Labs (all labs ordered are listed, but only abnormal results are displayed) Labs Reviewed  CBC WITH DIFFERENTIAL/PLATELET - Abnormal; Notable for the following components:      Result Value   WBC 14.6 (*)    Hemoglobin 11.6 (*)    MCHC 28.5 (*)    RDW 15.8 (*)    Neutro Abs 8.6 (*)    Lymphs Abs 5.3 (*)    All other components within normal limits  CULTURE, BLOOD (ROUTINE X 2)  CULTURE, BLOOD (ROUTINE X 2)  RESP PANEL BY RT-PCR (RSV, FLU A&B, COVID)  RVPGX2  BASIC METABOLIC PANEL  BRAIN NATRIURETIC PEPTIDE  LACTIC ACID, PLASMA  LACTIC ACID, PLASMA  TRIGLYCERIDES  I-STAT CHEM 8, ED  I-STAT VENOUS BLOOD GAS, ED  TROPONIN I (HIGH SENSITIVITY)    EKG EKG Interpretation  Date/Time:  Sunday August 07 2022 22:11:56 EDT Ventricular Rate:  136 PR Interval:  139 QRS Duration: 92 QT Interval:  298 QTC Calculation: 449 R Axis:   76 Text Interpretation: Sinus tachycardia Probable left atrial enlargement Baseline wander in lead(s) V5 Since last tracing rate faster Confirmed by Isla Pence [phone]) on 08/07/2022 10:35:16 PM  Radiology DG Chest Port 1 View  Result Date: 08/07/2022 CLINICAL DATA:  Shortness of breath. Respiratory distress. Unresponsive episode. History of COPD. EXAM: PORTABLE CHEST 1 VIEW COMPARISON:  05/04/2022 FINDINGS: An endotracheal tube has been placed with tip measuring 4.3 cm above the carina. An enteric tube was placed. Tip is off the field of view but below the left hemidiaphragm. Heart size and pulmonary vascularity are normal for technique. Emphysematous changes in the lungs with  scattered fibrosis. Peribronchial thickening suggesting chronic bronchitis. No airspace disease or consolidation. No pleural effusions. No pneumothorax. Mediastinal contours appear intact. IMPRESSION: Appliances appear in satisfactory position. Emphysematous changes and chronic bronchitic changes in the lungs. No focal consolidation. Electronically Signed   By: Lucienne Capers M.D.   On: 08/07/2022 22:37    Procedures Procedure Name: Intubation Date/Time: 08/07/2022 10:47 PM  Performed by: Bradd Canary, MDPre-anesthesia Checklist: Patient identified and Emergency Drugs available Oxygen Delivery Method: Ambu bag Preoxygenation: Pre-oxygenation with 100% oxygen Induction Type: Rapid sequence Ventilation: Mask ventilation without difficulty, Oral airway inserted -  appropriate to patient size and Nasal airway inserted- appropriate to patient size Laryngoscope Size: Glidescope and 4 Grade View: Grade I Tube size: 7.5 mm Number of attempts: 1 Airway Equipment and Method: Rigid stylet and Video-laryngoscopy Secured at: 25 cm Tube secured with: ETT holder Difficulty Due To: Difficulty was unanticipated Comments: Pt's upper dentures removed prior to intubation.      {Document cardiac monitor, telemetry assessment procedure when appropriate:1}  Medications Ordered in ED Medications  propofol (DIPRIVAN) 1000 MG/100ML infusion (10 mcg/kg/min  69.9 kg Intravenous Rate/Dose Change 08/07/22 2311)  fentaNYL 2571mcg in NS 279mL (72mcg/ml) infusion-PREMIX (25 mcg/hr Intravenous New Bag/Given 08/07/22 2311)  etomidate (AMIDATE) 2 MG/ML injection (20 mg  Given 08/07/22 2214)  rocuronium bromide 100 MG/10ML SOSY (70 mg  Given 08/07/22 2214)  fentaNYL (SUBLIMAZE) injection 50 mcg (50 mcg Intravenous Given 08/07/22 2259)    ED Course/ Medical Decision Making/ A&P   {   Click here for ABCD2, HEART and other calculatorsREFRESH Note before signing :1}                          Medical Decision  Making Amount and/or Complexity of Data Reviewed Labs: ordered. Radiology: ordered.  Risk Prescription drug management.   On arrival patient is being bagged with BVM, tachycardic and unresponsive.  On exam lung sounds are severely diminished throughout with wheezing notable more on the right than left.  Pupils are equal and reactive.  Due to patient's severe respiratory distress, patient was intubated shortly after arrival.  For details of this procedure see note above.  After admission she was placed on propofol.  She remained tachycardic, EKG appropriate sinus tach was noted or signs of acute ischemia.  Heart rate responded well to 50 g of fentanyl, initiated fentanyl drip to which patient responded well.  Labs notable for slight leukocytosis, elevated lactic acid, AKI, hypercarbic respiratory acidosis with a pH of 6.9.  Patient's initial troponin is elevated at 60.  {Document critical care time when appropriate:1} {Document review of labs and clinical decision tools ie heart score, Chads2Vasc2 etc:1}  {Document your independent review of radiology images, and any outside records:1} {Document your discussion with family members, caretakers, and with consultants:1} {Document social determinants of health affecting pt's care:1} {Document your decision making why or why not admission, treatments were needed:1} Final Clinical Impression(s) / ED Diagnoses Final diagnoses:  None    Rx / DC Orders ED Discharge Orders     None

## 2022-08-07 NOTE — ED Notes (Signed)
Fresh labs obtained by this Probation officer. Both I-stat chem-8 and VBG were ran. Findings outside of extreme values were shown to [facility].

## 2022-08-07 NOTE — ED Triage Notes (Signed)
Patient called ems secondary to respiratory distress. Patient progressively gasp for air till become unresponsive witnessed it by ems. Hx copd.received 2 duoneb, 1 albuterol, 125 mg solumedrol and 2 g mag

## 2022-08-08 ENCOUNTER — Inpatient Hospital Stay (HOSPITAL_COMMUNITY): Payer: BC Managed Care – PPO

## 2022-08-08 DIAGNOSIS — R079 Chest pain, unspecified: Secondary | ICD-10-CM | POA: Diagnosis not present

## 2022-08-08 DIAGNOSIS — R739 Hyperglycemia, unspecified: Secondary | ICD-10-CM

## 2022-08-08 DIAGNOSIS — T4275XA Adverse effect of unspecified antiepileptic and sedative-hypnotic drugs, initial encounter: Secondary | ICD-10-CM | POA: Diagnosis present

## 2022-08-08 DIAGNOSIS — N179 Acute kidney failure, unspecified: Secondary | ICD-10-CM

## 2022-08-08 DIAGNOSIS — Y846 Urinary catheterization as the cause of abnormal reaction of the patient, or of later complication, without mention of misadventure at the time of the procedure: Secondary | ICD-10-CM | POA: Diagnosis not present

## 2022-08-08 DIAGNOSIS — K219 Gastro-esophageal reflux disease without esophagitis: Secondary | ICD-10-CM | POA: Diagnosis present

## 2022-08-08 DIAGNOSIS — Z9981 Dependence on supplemental oxygen: Secondary | ICD-10-CM | POA: Diagnosis not present

## 2022-08-08 DIAGNOSIS — Z1152 Encounter for screening for COVID-19: Secondary | ICD-10-CM | POA: Diagnosis not present

## 2022-08-08 DIAGNOSIS — J441 Chronic obstructive pulmonary disease with (acute) exacerbation: Secondary | ICD-10-CM

## 2022-08-08 DIAGNOSIS — Y731 Therapeutic (nonsurgical) and rehabilitative gastroenterology and urology devices associated with adverse incidents: Secondary | ICD-10-CM | POA: Diagnosis not present

## 2022-08-08 DIAGNOSIS — I10 Essential (primary) hypertension: Secondary | ICD-10-CM | POA: Diagnosis not present

## 2022-08-08 DIAGNOSIS — G9349 Other encephalopathy: Secondary | ICD-10-CM | POA: Diagnosis not present

## 2022-08-08 DIAGNOSIS — I214 Non-ST elevation (NSTEMI) myocardial infarction: Secondary | ICD-10-CM

## 2022-08-08 DIAGNOSIS — T17890A Other foreign object in other parts of respiratory tract causing asphyxiation, initial encounter: Secondary | ICD-10-CM | POA: Diagnosis not present

## 2022-08-08 DIAGNOSIS — I5181 Takotsubo syndrome: Secondary | ICD-10-CM | POA: Diagnosis not present

## 2022-08-08 DIAGNOSIS — E162 Hypoglycemia, unspecified: Secondary | ICD-10-CM | POA: Diagnosis not present

## 2022-08-08 DIAGNOSIS — T8383XA Hemorrhage of genitourinary prosthetic devices, implants and grafts, initial encounter: Secondary | ICD-10-CM | POA: Diagnosis not present

## 2022-08-08 DIAGNOSIS — J9621 Acute and chronic respiratory failure with hypoxia: Secondary | ICD-10-CM | POA: Diagnosis not present

## 2022-08-08 DIAGNOSIS — I700 Atherosclerosis of aorta: Secondary | ICD-10-CM | POA: Diagnosis present

## 2022-08-08 DIAGNOSIS — J9601 Acute respiratory failure with hypoxia: Secondary | ICD-10-CM | POA: Diagnosis not present

## 2022-08-08 DIAGNOSIS — B9781 Human metapneumovirus as the cause of diseases classified elsewhere: Secondary | ICD-10-CM | POA: Diagnosis present

## 2022-08-08 DIAGNOSIS — J9602 Acute respiratory failure with hypercapnia: Secondary | ICD-10-CM | POA: Diagnosis not present

## 2022-08-08 DIAGNOSIS — J9622 Acute and chronic respiratory failure with hypercapnia: Secondary | ICD-10-CM | POA: Diagnosis not present

## 2022-08-08 DIAGNOSIS — T380X5A Adverse effect of glucocorticoids and synthetic analogues, initial encounter: Secondary | ICD-10-CM | POA: Diagnosis present

## 2022-08-08 DIAGNOSIS — D509 Iron deficiency anemia, unspecified: Secondary | ICD-10-CM | POA: Diagnosis not present

## 2022-08-08 DIAGNOSIS — I21A1 Myocardial infarction type 2: Secondary | ICD-10-CM | POA: Diagnosis not present

## 2022-08-08 DIAGNOSIS — E8729 Other acidosis: Secondary | ICD-10-CM | POA: Diagnosis not present

## 2022-08-08 DIAGNOSIS — I959 Hypotension, unspecified: Secondary | ICD-10-CM | POA: Diagnosis not present

## 2022-08-08 DIAGNOSIS — E785 Hyperlipidemia, unspecified: Secondary | ICD-10-CM | POA: Diagnosis present

## 2022-08-08 DIAGNOSIS — F419 Anxiety disorder, unspecified: Secondary | ICD-10-CM | POA: Diagnosis present

## 2022-08-08 HISTORY — DX: Acute respiratory failure with hypercapnia: J96.02

## 2022-08-08 HISTORY — DX: Acute kidney failure, unspecified: N17.9

## 2022-08-08 HISTORY — DX: Chronic obstructive pulmonary disease with (acute) exacerbation: J44.1

## 2022-08-08 HISTORY — DX: Hyperglycemia, unspecified: R73.9

## 2022-08-08 LAB — GLUCOSE, CAPILLARY
Glucose-Capillary: 209 mg/dL — ABNORMAL HIGH (ref 70–99)
Glucose-Capillary: 193 mg/dL — ABNORMAL HIGH (ref 70–99)
Glucose-Capillary: 136 mg/dL — ABNORMAL HIGH (ref 70–99)
Glucose-Capillary: 144 mg/dL — ABNORMAL HIGH (ref 70–99)
Glucose-Capillary: 140 mg/dL — ABNORMAL HIGH (ref 70–99)
Glucose-Capillary: 128 mg/dL — ABNORMAL HIGH (ref 70–99)
Glucose-Capillary: 130 mg/dL — ABNORMAL HIGH (ref 70–99)

## 2022-08-08 LAB — RESPIRATORY PANEL BY PCR

## 2022-08-08 LAB — BASIC METABOLIC PANEL
Anion gap: 13 (ref 5–15)
BUN: 17 mg/dL (ref 8–23)
CO2: 26 mmol/L (ref 22–32)
Calcium: 8.1 mg/dL — ABNORMAL LOW (ref 8.9–10.3)
Chloride: 99 mmol/L (ref 98–111)
Creatinine, Ser: 0.95 mg/dL (ref 0.44–1.00)
GFR, Estimated: >60 mL/min (ref >60)
Glucose, Bld: 208 mg/dL — ABNORMAL HIGH (ref 70–99)
Potassium: 3.8 mmol/L (ref 3.5–5.1)
Sodium: 138 mmol/L (ref 135–145)

## 2022-08-08 LAB — I-STAT VENOUS BLOOD GAS, ED
Acid-Base Excess: 0 mmol/L (ref 0.0–2.0)
Bicarbonate: 26.9 mmol/L (ref 20.0–28.0)
Calcium, Ion: 0.99 mmol/L — ABNORMAL LOW (ref 1.15–1.40)
HCT: 27 % — ABNORMAL LOW (ref 36.0–46.0)
Hemoglobin: 9.2 g/dL — ABNORMAL LOW (ref 12.0–15.0)
O2 Saturation: 95 %
Potassium: 2.7 mmol/L — CL (ref 3.5–5.1)
Sodium: 143 mmol/L (ref 135–145)
TCO2: 29 mmol/L (ref 22–32)
pCO2, Ven: 55.9 mmHg (ref 44–60)
pH, Ven: 7.289 (ref 7.25–7.43)
pO2, Ven: 87 mmHg — ABNORMAL HIGH (ref 32–45)

## 2022-08-08 LAB — I-STAT ARTERIAL BLOOD GAS, ED
Acid-Base Excess: 6 mmol/L — ABNORMAL HIGH (ref 0.0–2.0)
Acid-Base Excess: 7 mmol/L — ABNORMAL HIGH (ref 0.0–2.0)
Bicarbonate: 37 mmol/L — ABNORMAL HIGH (ref 20.0–28.0)
Bicarbonate: 34.2 mmol/L — ABNORMAL HIGH (ref 20.0–28.0)
Calcium, Ion: 1.25 mmol/L (ref 1.15–1.40)
Calcium, Ion: 1.13 mmol/L — ABNORMAL LOW (ref 1.15–1.40)
HCT: 35 % — ABNORMAL LOW (ref 36.0–46.0)
HCT: 30 % — ABNORMAL LOW (ref 36.0–46.0)
Hemoglobin: 11.9 g/dL — ABNORMAL LOW (ref 12.0–15.0)
Hemoglobin: 10.2 g/dL — ABNORMAL LOW (ref 12.0–15.0)
O2 Saturation: 98 %
O2 Saturation: 95 %
Patient temperature: 98.3
Patient temperature: 96.7
Potassium: 3.6 mmol/L (ref 3.5–5.1)
Potassium: 3.5 mmol/L (ref 3.5–5.1)
Sodium: 138 mmol/L (ref 135–145)
Sodium: 139 mmol/L (ref 135–145)
TCO2: 40 mmol/L — ABNORMAL HIGH (ref 22–32)
TCO2: 36 mmol/L — ABNORMAL HIGH (ref 22–32)
pCO2 arterial: 97.2 mmHg (ref 32–48)
pCO2 arterial: 59.4 mmHg — ABNORMAL HIGH (ref 32–48)
pH, Arterial: 7.188 — CL (ref 7.35–7.45)
pH, Arterial: 7.363 (ref 7.35–7.45)
pO2, Arterial: 142 mmHg — ABNORMAL HIGH (ref 83–108)
pO2, Arterial: 77 mmHg — ABNORMAL LOW (ref 83–108)

## 2022-08-08 LAB — CBC
HCT: 32.3 % — ABNORMAL LOW (ref 36.0–46.0)
HCT: 33.9 % — ABNORMAL LOW (ref 36.0–46.0)
Hemoglobin: 10.1 g/dL — ABNORMAL LOW (ref 12.0–15.0)
Hemoglobin: 10.4 g/dL — ABNORMAL LOW (ref 12.0–15.0)
MCH: 28.6 pg (ref 26.0–34.0)
MCH: 27.9 pg (ref 26.0–34.0)
MCHC: 31.3 g/dL (ref 30.0–36.0)
MCHC: 30.7 g/dL (ref 30.0–36.0)
MCV: 91.5 fL (ref 80.0–100.0)
MCV: 90.9 fL (ref 80.0–100.0)
Platelets: 259 10*3/uL (ref 150–400)
Platelets: 230 10*3/uL (ref 150–400)
RBC: 3.53 MIL/uL — ABNORMAL LOW (ref 3.87–5.11)
RBC: 3.73 MIL/uL — ABNORMAL LOW (ref 3.87–5.11)
RDW: 15.6 % — ABNORMAL HIGH (ref 11.5–15.5)
RDW: 15.8 % — ABNORMAL HIGH (ref 11.5–15.5)
WBC: 17 10*3/uL — ABNORMAL HIGH (ref 4.0–10.5)
WBC: 18.2 10*3/uL — ABNORMAL HIGH (ref 4.0–10.5)
nRBC: 0 % (ref 0.0–0.2)
nRBC: 0 % (ref 0.0–0.2)

## 2022-08-08 LAB — URINALYSIS, ROUTINE W REFLEX MICROSCOPIC
Bilirubin Urine: NEGATIVE
Glucose, UA: NEGATIVE mg/dL
Hgb urine dipstick: NEGATIVE
Ketones, ur: 5 mg/dL — AB
Leukocytes,Ua: NEGATIVE
Nitrite: NEGATIVE
Protein, ur: NEGATIVE mg/dL
Specific Gravity, Urine: 1.016 (ref 1.005–1.030)
pH: 5 (ref 5.0–8.0)

## 2022-08-08 LAB — TROPONIN I (HIGH SENSITIVITY)
Troponin I (High Sensitivity): 375 ng/L (ref <18)
Troponin I (High Sensitivity): 743 ng/L (ref <18)
Troponin I (High Sensitivity): 917 ng/L (ref <18)

## 2022-08-08 LAB — MRSA NEXT GEN BY PCR, NASAL: MRSA by PCR Next Gen: NOT DETECTED

## 2022-08-08 LAB — RAPID URINE DRUG SCREEN, HOSP PERFORMED
Amphetamines: NOT DETECTED
Barbiturates: NOT DETECTED
Benzodiazepines: NOT DETECTED
Cocaine: NOT DETECTED
Opiates: NOT DETECTED
Tetrahydrocannabinol: NOT DETECTED

## 2022-08-08 LAB — TRIGLYCERIDES: Triglycerides: 63 mg/dL (ref <150)

## 2022-08-08 LAB — MAGNESIUM: Magnesium: 3 mg/dL — ABNORMAL HIGH (ref 1.7–2.4)

## 2022-08-08 LAB — AMMONIA: Ammonia: 19 umol/L (ref 9–35)

## 2022-08-08 LAB — ETHANOL: Alcohol, Ethyl (B): <10 mg/dL (ref <10)

## 2022-08-08 LAB — PROCALCITONIN: Procalcitonin: 0.29 ng/mL

## 2022-08-08 LAB — HEPARIN LEVEL (UNFRACTIONATED)
Heparin Unfractionated: 0.33 IU/mL (ref 0.30–0.70)
Heparin Unfractionated: 0.23 IU/mL — ABNORMAL LOW (ref 0.30–0.70)

## 2022-08-08 LAB — CREATININE, SERUM
Creatinine, Ser: 1.01 mg/dL — ABNORMAL HIGH (ref 0.44–1.00)
GFR, Estimated: >60 mL/min (ref >60)

## 2022-08-08 LAB — LACTIC ACID, PLASMA
Lactic Acid, Venous: 0.9 mmol/L (ref 0.5–1.9)
Lactic Acid, Venous: 2.7 mmol/L (ref 0.5–1.9)

## 2022-08-08 LAB — STREP PNEUMONIAE URINARY ANTIGEN: Strep Pneumo Urinary Antigen: NEGATIVE

## 2022-08-08 MED ORDER — CHLORHEXIDINE GLUCONATE CLOTH 2 % EX PADS
6.0000 | MEDICATED_PAD | Freq: Every day | CUTANEOUS | Status: DC
  Administered 2022-08-08 – 2022-08-14 (×7): 6 via TOPICAL

## 2022-08-08 MED ORDER — FENTANYL 2500MCG IN NS 250ML (10MCG/ML) PREMIX INFUSION
50.0000 ug/h | INTRAVENOUS | Status: DC
  Administered 2022-08-08 – 2022-08-09 (×2): 150 ug/h via INTRAVENOUS
  Filled 2022-08-08 (×2): qty 250

## 2022-08-08 MED ORDER — NOREPINEPHRINE 4 MG/250ML-% IV SOLN
2.0000 ug/min | INTRAVENOUS | Status: DC
  Administered 2022-08-08: 7 ug/min via INTRAVENOUS
  Administered 2022-08-08: 2 ug/min via INTRAVENOUS
  Filled 2022-08-08 (×2): qty 250

## 2022-08-08 MED ORDER — SODIUM CHLORIDE 0.9 % IV SOLN
500.0000 mg | INTRAVENOUS | Status: DC
  Administered 2022-08-08: 500 mg via INTRAVENOUS
  Filled 2022-08-08: qty 5

## 2022-08-08 MED ORDER — IPRATROPIUM-ALBUTEROL 0.5-2.5 (3) MG/3ML IN SOLN
3.0000 mL | RESPIRATORY_TRACT | Status: DC
  Administered 2022-08-08 – 2022-08-11 (×21): 3 mL via RESPIRATORY_TRACT
  Filled 2022-08-08 (×20): qty 3

## 2022-08-08 MED ORDER — DOCUSATE SODIUM 50 MG/5ML PO LIQD
100.0000 mg | Freq: Two times a day (BID) | ORAL | Status: DC
  Administered 2022-08-08 (×2): 100 mg
  Filled 2022-08-08 (×2): qty 10

## 2022-08-08 MED ORDER — HEPARIN BOLUS VIA INFUSION
4000.0000 [IU] | Freq: Once | INTRAVENOUS | Status: AC
  Administered 2022-08-08: 4000 [IU] via INTRAVENOUS
  Filled 2022-08-08: qty 4000

## 2022-08-08 MED ORDER — BUDESONIDE 0.5 MG/2ML IN SUSP
0.5000 mg | Freq: Two times a day (BID) | RESPIRATORY_TRACT | Status: DC
  Administered 2022-08-08 – 2022-08-14 (×13): 0.5 mg via RESPIRATORY_TRACT
  Filled 2022-08-08 (×13): qty 2

## 2022-08-08 MED ORDER — FAMOTIDINE 20 MG PO TABS: 20.0000 mg | ORAL_TABLET | Freq: Every day | ORAL | Status: DC

## 2022-08-08 MED ORDER — FENTANYL BOLUS VIA INFUSION
50.0000 ug | INTRAVENOUS | Status: DC | PRN
  Administered 2022-08-08 – 2022-08-09 (×6): 100 ug via INTRAVENOUS

## 2022-08-08 MED ORDER — IPRATROPIUM-ALBUTEROL 0.5-2.5 (3) MG/3ML IN SOLN
3.0000 mL | RESPIRATORY_TRACT | Status: DC | PRN
  Administered 2022-08-09: 3 mL via RESPIRATORY_TRACT
  Filled 2022-08-08 (×2): qty 3

## 2022-08-08 MED ORDER — POLYETHYLENE GLYCOL 3350 17 G PO PACK: 17.0000 g | PACK | Freq: Every day | ORAL | Status: DC

## 2022-08-08 MED ORDER — SODIUM CHLORIDE 0.9 % IV SOLN
250.0000 mL | INTRAVENOUS | Status: DC
  Administered 2022-08-08 (×2): 250 mL via INTRAVENOUS

## 2022-08-08 MED ORDER — LACTATED RINGERS IV BOLUS
1000.0000 mL | Freq: Once | INTRAVENOUS | Status: AC
  Administered 2022-08-08: 1000 mL via INTRAVENOUS

## 2022-08-08 MED ORDER — MIDAZOLAM HCL 2 MG/2ML IJ SOLN
2.0000 mg | INTRAMUSCULAR | Status: DC | PRN
  Administered 2022-08-09 – 2022-08-12 (×9): 2 mg via INTRAVENOUS
  Filled 2022-08-08 (×10): qty 2

## 2022-08-08 MED ORDER — ENOXAPARIN SODIUM 40 MG/0.4ML IJ SOSY: 40.0000 mg | PREFILLED_SYRINGE | Freq: Every day | INTRAMUSCULAR | Status: DC

## 2022-08-08 MED ORDER — POTASSIUM CHLORIDE 20 MEQ PO PACK
20.0000 meq | PACK | Freq: Once | ORAL | Status: AC
  Administered 2022-08-08: 20 meq
  Filled 2022-08-08: qty 1

## 2022-08-08 MED ORDER — MONTELUKAST SODIUM 10 MG PO TABS
10.0000 mg | ORAL_TABLET | Freq: Every day | ORAL | Status: DC
  Administered 2022-08-08 – 2022-08-11 (×4): 10 mg
  Filled 2022-08-08 (×4): qty 1

## 2022-08-08 MED ORDER — FAMOTIDINE 20 MG PO TABS
20.0000 mg | ORAL_TABLET | Freq: Two times a day (BID) | ORAL | Status: DC
  Administered 2022-08-08: 20 mg
  Filled 2022-08-08: qty 1

## 2022-08-08 MED ORDER — IPRATROPIUM-ALBUTEROL 0.5-2.5 (3) MG/3ML IN SOLN: 3.0000 mL | Freq: Four times a day (QID) | RESPIRATORY_TRACT | Status: DC | PRN

## 2022-08-08 MED ORDER — ORAL CARE MOUTH RINSE: 15.0000 mL | OROMUCOSAL | Status: DC | PRN

## 2022-08-08 MED ORDER — INSULIN ASPART 100 UNIT/ML IJ SOLN
0.0000 [IU] | INTRAMUSCULAR | Status: DC
  Administered 2022-08-08: 1 [IU] via SUBCUTANEOUS
  Administered 2022-08-08: 3 [IU] via SUBCUTANEOUS
  Administered 2022-08-08: 1 [IU] via SUBCUTANEOUS
  Administered 2022-08-08: 3 [IU] via SUBCUTANEOUS
  Administered 2022-08-08 – 2022-08-10 (×8): 1 [IU] via SUBCUTANEOUS
  Administered 2022-08-10: 2 [IU] via SUBCUTANEOUS
  Administered 2022-08-10 (×2): 1 [IU] via SUBCUTANEOUS
  Administered 2022-08-10 (×2): 2 [IU] via SUBCUTANEOUS
  Administered 2022-08-10: 1 [IU] via SUBCUTANEOUS
  Administered 2022-08-11 (×5): 2 [IU] via SUBCUTANEOUS
  Administered 2022-08-11 – 2022-08-12 (×2): 1 [IU] via SUBCUTANEOUS
  Administered 2022-08-12: 2 [IU] via SUBCUTANEOUS
  Administered 2022-08-14: 7 [IU] via SUBCUTANEOUS

## 2022-08-08 MED ORDER — POLYETHYLENE GLYCOL 3350 17 G PO PACK: 17.0000 g | PACK | Freq: Every day | ORAL | Status: DC | PRN

## 2022-08-08 MED ORDER — SODIUM CHLORIDE 0.9 % IV SOLN
2.0000 g | INTRAVENOUS | Status: DC
  Administered 2022-08-08: 2 g via INTRAVENOUS
  Filled 2022-08-08: qty 20

## 2022-08-08 MED ORDER — ORAL CARE MOUTH RINSE
15.0000 mL | OROMUCOSAL | Status: DC
  Administered 2022-08-08 – 2022-08-12 (×54): 15 mL via OROMUCOSAL

## 2022-08-08 MED ORDER — METHYLPREDNISOLONE SODIUM SUCC 125 MG IJ SOLR
125.0000 mg | Freq: Two times a day (BID) | INTRAMUSCULAR | Status: AC
  Administered 2022-08-08 – 2022-08-12 (×10): 125 mg via INTRAVENOUS
  Filled 2022-08-08 (×10): qty 2

## 2022-08-08 MED ORDER — MAGNESIUM SULFATE 2 GM/50ML IV SOLN
2.0000 g | Freq: Once | INTRAVENOUS | Status: AC
  Administered 2022-08-08: 2 g via INTRAVENOUS
  Filled 2022-08-08: qty 50

## 2022-08-08 MED ORDER — PANTOPRAZOLE SODIUM 40 MG IV SOLR: 40.0000 mg | Freq: Every day | INTRAVENOUS | Status: DC

## 2022-08-08 MED ORDER — DOCUSATE SODIUM 50 MG/5ML PO LIQD: 100.0000 mg | Freq: Two times a day (BID) | ORAL | Status: DC | PRN

## 2022-08-08 MED ORDER — HEPARIN SODIUM (PORCINE) 5000 UNIT/ML IJ SOLN: 5000.0000 [IU] | Freq: Three times a day (TID) | INTRAMUSCULAR | Status: DC

## 2022-08-08 MED ORDER — HEPARIN (PORCINE) 25000 UT/250ML-% IV SOLN
1050.0000 [IU]/h | INTRAVENOUS | Status: DC
  Administered 2022-08-08: 800 [IU]/h via INTRAVENOUS
  Administered 2022-08-09: 1050 [IU]/h via INTRAVENOUS
  Filled 2022-08-08 (×2): qty 250
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
## 2022-08-08 NOTE — Progress Notes (Signed)
eLink Physician-Brief Progress Note Patient Name: [name] DOB: [birth year] MRN: [id number]   Date of Service  08/08/2022  HPI/Events of Note  Notified of discrepancy with pupils.  Right is at 65mm and left at 88mm.  The patient is intubated and sedated but is able to follow simple commands with no other change in neurologic exam.   eICU Interventions  Will continue to monitor for now.      Intervention Category Intermediate Interventions: Other:  [name] 08/08/2022, 9:09 PM
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
## 2022-08-08 NOTE — Progress Notes (Signed)
Ultrasound GPIV has been placed for short term , vasopressor infusion it correctly placed I-watch must be used when administrating vasopressors. Should this treatment beyond 72 hours central line access should be obtained. It will be the responsibility of the bedside nurse to follow the best practice to prevent externalization.

## 2022-08-08 NOTE — Progress Notes (Addendum)
eLink Physician-Brief Progress Note Patient Name: [name] DOB: 06/15/1960 MRN: [id number]   Date of Service  08/08/2022  HPI/Events of Note  62/F with chronic respiratory failure on 5L O2, COPD, HTN, presenting with respiratory distress. Ot was given IV steroids, neb treatments, subsequently intubated while in the ED.  Pt had diffuse wheezing and poorly responsive.   BP 104/73, HR 71, RR 24, O2 sats 98%.  Pt is intubated and sedated.   ABG 7.363/59.4/77 CT head no acute intracranial abnormality   eICU Interventions  Continue IV steroids, empiric antibiotics and neb treatments.  Wean levophed to keep MAP >65.  PT sedated on propofol and fentanyl gtts.  Add versed IV PRN.  Insulin for glucose control.  Famotidine for GI prophylaxis.  Start on lovenox for DVT prophylaxis.      Intervention Category Evaluation Type: New Patient Evaluation  [name] 08/08/2022, 2:38 AM  5:31 AM Lactic acid normalized 2.3 --> 0.9.  Troponin 60 --> 375.   Plan> Continue to trend troponin.
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
## 2022-08-08 NOTE — Progress Notes (Signed)
This RN contacted lab regarding a couple troponin's pending from this morning. Per lab, running a troponin now.  Will continue to monitor & follow up.  Dewaine Oats
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
## 2022-08-08 NOTE — Progress Notes (Addendum)
RT NOTE:  ABG collected and documented. Reported to Dr. Duwayne Heck, CCM  pH:      7.36 Co2:    59.4 Po2:     77 HCo3:  34 So2:      95%  Vt: 420 RR: 24 Fio2: 40% Peep: 8+ I time: 0.6

## 2022-08-08 NOTE — Progress Notes (Signed)
RT NOTE:  Pt transported to CT, then 44M without event. Report given to Judson Roch, RRT.

## 2022-08-09 ENCOUNTER — Inpatient Hospital Stay (HOSPITAL_COMMUNITY): Payer: BC Managed Care – PPO

## 2022-08-09 DIAGNOSIS — J9602 Acute respiratory failure with hypercapnia: Secondary | ICD-10-CM | POA: Diagnosis not present

## 2022-08-09 DIAGNOSIS — J9601 Acute respiratory failure with hypoxia: Secondary | ICD-10-CM | POA: Diagnosis not present

## 2022-08-09 DIAGNOSIS — R079 Chest pain, unspecified: Secondary | ICD-10-CM | POA: Diagnosis not present

## 2022-08-09 LAB — POCT I-STAT 7, (LYTES, BLD GAS, ICA,H+H)
Acid-Base Excess: 7 mmol/L — ABNORMAL HIGH (ref 0.0–2.0)
Bicarbonate: 34 mmol/L — ABNORMAL HIGH (ref 20.0–28.0)
Calcium, Ion: 1.15 mmol/L (ref 1.15–1.40)
HCT: 30 % — ABNORMAL LOW (ref 36.0–46.0)
Hemoglobin: 10.2 g/dL — ABNORMAL LOW (ref 12.0–15.0)
O2 Saturation: 94 %
Patient temperature: 96.7
Potassium: 3.5 mmol/L (ref 3.5–5.1)
Sodium: 139 mmol/L (ref 135–145)
TCO2: 36 mmol/L — ABNORMAL HIGH (ref 22–32)
pCO2 arterial: 59.6 mmHg — ABNORMAL HIGH (ref 32–48)
pH, Arterial: 7.359 (ref 7.35–7.45)
pO2, Arterial: 74 mmHg — ABNORMAL LOW (ref 83–108)

## 2022-08-09 LAB — IRON AND TIBC
Iron: 19 ug/dL — ABNORMAL LOW (ref 28–170)
Saturation Ratios: 6 % — ABNORMAL LOW (ref 10.4–31.8)
TIBC: 301 ug/dL (ref 250–450)
UIBC: 282 ug/dL

## 2022-08-09 LAB — ECHOCARDIOGRAM COMPLETE
Area-P 1/2: 3.03 cm2
Calc EF: 47.8 %
Height: 63 in
P 1/2 time: 529 msec
S' Lateral: 3.7 cm
Single Plane A2C EF: 47.6 %
Single Plane A4C EF: 50 %
Weight: 2631.41 oz

## 2022-08-09 LAB — RETICULOCYTES
Immature Retic Fract: 15.7 % (ref 2.3–15.9)
RBC.: 3.4 MIL/uL — ABNORMAL LOW (ref 3.87–5.11)
Retic Count, Absolute: 47.6 10*3/uL (ref 19.0–186.0)
Retic Ct Pct: 1.4 % (ref 0.4–3.1)

## 2022-08-09 LAB — CBC
HCT: 30.6 % — ABNORMAL LOW (ref 36.0–46.0)
Hemoglobin: 9.7 g/dL — ABNORMAL LOW (ref 12.0–15.0)
MCH: 28 pg (ref 26.0–34.0)
MCHC: 31.7 g/dL (ref 30.0–36.0)
MCV: 88.2 fL (ref 80.0–100.0)
Platelets: 227 10*3/uL (ref 150–400)
RBC: 3.47 MIL/uL — ABNORMAL LOW (ref 3.87–5.11)
RDW: 16.1 % — ABNORMAL HIGH (ref 11.5–15.5)
WBC: 12.5 10*3/uL — ABNORMAL HIGH (ref 4.0–10.5)
nRBC: 0 % (ref 0.0–0.2)

## 2022-08-09 LAB — FERRITIN: Ferritin: 38 ng/mL (ref 11–307)

## 2022-08-09 LAB — BASIC METABOLIC PANEL
Anion gap: 9 (ref 5–15)
BUN: 11 mg/dL (ref 8–23)
CO2: 28 mmol/L (ref 22–32)
Calcium: 8.4 mg/dL — ABNORMAL LOW (ref 8.9–10.3)
Chloride: 99 mmol/L (ref 98–111)
Creatinine, Ser: 0.75 mg/dL (ref 0.44–1.00)
GFR, Estimated: >60 mL/min (ref >60)
Glucose, Bld: 144 mg/dL — ABNORMAL HIGH (ref 70–99)
Potassium: 3.6 mmol/L (ref 3.5–5.1)
Sodium: 136 mmol/L (ref 135–145)

## 2022-08-09 LAB — GLUCOSE, CAPILLARY
Glucose-Capillary: 119 mg/dL — ABNORMAL HIGH (ref 70–99)
Glucose-Capillary: 121 mg/dL — ABNORMAL HIGH (ref 70–99)
Glucose-Capillary: 127 mg/dL — ABNORMAL HIGH (ref 70–99)
Glucose-Capillary: 132 mg/dL — ABNORMAL HIGH (ref 70–99)
Glucose-Capillary: 146 mg/dL — ABNORMAL HIGH (ref 70–99)
Glucose-Capillary: 148 mg/dL — ABNORMAL HIGH (ref 70–99)

## 2022-08-09 LAB — PHOSPHORUS: Phosphorus: 2.9 mg/dL (ref 2.5–4.6)

## 2022-08-09 LAB — TROPONIN I (HIGH SENSITIVITY): Troponin I (High Sensitivity): 350 ng/L (ref <18)

## 2022-08-09 LAB — HEMOGLOBIN A1C
Hgb A1c MFr Bld: 6.2 % — ABNORMAL HIGH (ref 4.8–5.6)
Mean Plasma Glucose: 131 mg/dL

## 2022-08-09 LAB — MAGNESIUM: Magnesium: 2.5 mg/dL — ABNORMAL HIGH (ref 1.7–2.4)

## 2022-08-09 LAB — LACTIC ACID, PLASMA: Lactic Acid, Venous: 1.6 mmol/L (ref 0.5–1.9)

## 2022-08-09 LAB — HEPARIN LEVEL (UNFRACTIONATED): Heparin Unfractionated: 0.28 IU/mL — ABNORMAL LOW (ref 0.30–0.70)

## 2022-08-09 MED ORDER — SODIUM CHLORIDE 0.9 % IV SOLN
250.0000 mg | Freq: Every day | INTRAVENOUS | Status: AC
  Administered 2022-08-09 – 2022-08-11 (×3): 250 mg via INTRAVENOUS
  Filled 2022-08-09 (×3): qty 20

## 2022-08-09 MED ORDER — ALPRAZOLAM 0.5 MG PO TABS
0.2500 mg | ORAL_TABLET | Freq: Once | ORAL | Status: AC
  Administered 2022-08-10: 0.25 mg
  Filled 2022-08-09: qty 1

## 2022-08-09 MED ORDER — PROSOURCE TF20 ENFIT COMPATIBL EN LIQD
60.0000 mL | Freq: Every day | ENTERAL | Status: DC
  Administered 2022-08-09 – 2022-08-12 (×4): 60 mL
  Filled 2022-08-09 (×4): qty 60

## 2022-08-09 MED ORDER — GUAIFENESIN 100 MG/5ML PO LIQD
5.0000 mL | Freq: Four times a day (QID) | ORAL | Status: DC
  Administered 2022-08-09 – 2022-08-11 (×7): 5 mL
  Filled 2022-08-09 (×8): qty 5

## 2022-08-09 MED ORDER — OSMOLITE 1.2 CAL PO LIQD
1000.0000 mL | ORAL | Status: DC
  Administered 2022-08-09 – 2022-08-11 (×3): 1000 mL
  Filled 2022-08-09 (×6): qty 1000

## 2022-08-09 MED ORDER — PERFLUTREN LIPID MICROSPHERE
1.0000 mL | INTRAVENOUS | Status: AC | PRN
  Administered 2022-08-09: 2 mL via INTRAVENOUS

## 2022-08-09 MED ORDER — SENNOSIDES 8.8 MG/5ML PO SYRP
10.0000 mL | ORAL_SOLUTION | Freq: Two times a day (BID) | ORAL | Status: DC
  Administered 2022-08-09 – 2022-08-11 (×5): 10 mL
  Filled 2022-08-09 (×5): qty 10

## 2022-08-09 MED ORDER — FREE WATER: 200.0000 mL | Freq: Four times a day (QID) | Status: DC

## 2022-08-09 MED ORDER — DEXMEDETOMIDINE HCL IN NACL 400 MCG/100ML IV SOLN
INTRAVENOUS | Status: AC
  Administered 2022-08-09: 0.4 ug/kg/h via INTRAVENOUS
  Filled 2022-08-09: qty 100

## 2022-08-09 MED ORDER — FENTANYL CITRATE PF 50 MCG/ML IJ SOSY
25.0000 ug | PREFILLED_SYRINGE | INTRAMUSCULAR | Status: AC | PRN
  Administered 2022-08-10 (×4): 25 ug via INTRAVENOUS
  Filled 2022-08-09 (×4): qty 1

## 2022-08-09 MED ORDER — POLYETHYLENE GLYCOL 3350 17 G PO PACK
17.0000 g | PACK | Freq: Every day | ORAL | Status: DC
  Administered 2022-08-09 – 2022-08-10 (×2): 17 g
  Filled 2022-08-09 (×3): qty 1

## 2022-08-09 MED ORDER — DEXMEDETOMIDINE HCL IN NACL 400 MCG/100ML IV SOLN
0.0000 ug/kg/h | INTRAVENOUS | Status: DC
  Administered 2022-08-09: 0.6 ug/kg/h via INTRAVENOUS
  Administered 2022-08-10: 1 ug/kg/h via INTRAVENOUS
  Administered 2022-08-10: 0.5 ug/kg/h via INTRAVENOUS
  Administered 2022-08-10 – 2022-08-11 (×3): 1 ug/kg/h via INTRAVENOUS
  Administered 2022-08-11: 1.2 ug/kg/h via INTRAVENOUS
  Administered 2022-08-11 (×2): 1 ug/kg/h via INTRAVENOUS
  Administered 2022-08-12 (×2): 1.2 ug/kg/h via INTRAVENOUS
  Filled 2022-08-09 (×5): qty 100
  Filled 2022-08-09: qty 200
  Filled 2022-08-09 (×2): qty 100
  Filled 2022-08-09: qty 200

## 2022-08-09 MED ORDER — MIDAZOLAM HCL 2 MG/2ML IJ SOLN: 2.0000 mg | Freq: Once | INTRAMUSCULAR | Status: AC

## 2022-08-09 MED ORDER — PHENOL 1.4 % MT LIQD
1.0000 | OROMUCOSAL | Status: AC | PRN
  Administered 2022-08-10: 1 via OROMUCOSAL
  Filled 2022-08-09: qty 177

## 2022-08-09 MED ORDER — PANTOPRAZOLE SODIUM 40 MG IV SOLR
40.0000 mg | INTRAVENOUS | Status: DC
  Administered 2022-08-09 – 2022-08-12 (×4): 40 mg via INTRAVENOUS
  Filled 2022-08-09 (×4): qty 10

## 2022-08-09 MED ORDER — POTASSIUM CHLORIDE 20 MEQ PO PACK
40.0000 meq | PACK | Freq: Once | ORAL | Status: AC
  Administered 2022-08-09: 40 meq
  Filled 2022-08-09: qty 2

## 2022-08-09 MED ORDER — DOCUSATE SODIUM 50 MG/5ML PO LIQD
100.0000 mg | Freq: Two times a day (BID) | ORAL | Status: DC
  Administered 2022-08-09 – 2022-08-11 (×6): 100 mg
  Filled 2022-08-09 (×6): qty 10

## 2022-08-09 MED ORDER — FREE WATER
200.0000 mL | Freq: Three times a day (TID) | Status: DC
  Administered 2022-08-09 – 2022-08-11 (×6): 200 mL

## 2022-08-09 NOTE — Progress Notes (Signed)
  Echocardiogram 2D Echocardiogram has been performed.  [name] 08/09/2022, 11:17 AM
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
## 2022-08-09 NOTE — Progress Notes (Signed)
Initial Nutrition Assessment  DOCUMENTATION CODES:   Not applicable  INTERVENTION:  If unable to be extubated, recommend tube feeding via OGT: Osmolite 1.2 at 55 ml/h (1320 ml per day) Prosource TF20 60 ml 1x/d Provides 1664 kcal, 93 gm protein, 1082 ml free water daily  NUTRITION DIAGNOSIS:   Increased nutrient needs related to acute illness (COPD exacerbation) as evidenced by estimated needs.  GOAL:   Patient will meet greater than or equal to 90% of their needs  MONITOR:   Vent status, Labs, Weight trends, I & O's  REASON FOR ASSESSMENT:   Ventilator    ASSESSMENT:  Pt with hx of HLD, HTN, COPD on baseline 5L of oxygen, GERD, and CAD presented to ED with a COPD exacerbation. Intubated on arrival to the ED.   Patient is currently intubated on ventilator support. OGT in place and gastric per XR. However, RN reports that OGT has been dislodged. If not extubated needs to be re-advanced and re-imaged. Pt unable to wean off vent yesterday, but currently on weaning trial today. Discussed in rounds, if unable to extubate today, will start nutrition.   Pt awake and alert on vent. Able to answer questions by writing on a pad of paper. Pt reports a poor appetite for "awhile" and that she has been losing weight to the point that her clothes are loose. Also endorses decreased energy. 4.2% weight loss is noted over the last 3 months which is not severe for the timeframe but concerning as it was unintentional and has been accompanied by a loss of energy. Some muscle and fat deficits present on exam and pt is at risk for developing malnutrition.   MV: 10 L/min Temp (24hrs), Avg:97.8 F (36.6 C), Min:96.6 F (35.9 C), Max:98.8 F (37.1 C)  Propofol: 4.19 ml/hr (111 kcal/d)   Intake/Output Summary (Last 24 hours) at 08/09/2022 1340 Last data filed at 08/09/2022 1200 Gross per 24 hour  Intake 1749.76 ml  Output 595 ml  Net 1154.76 ml  Net IO Since Admission: 3,449.17 mL [08/09/22  1340]  Nutritionally Relevant Medications: Scheduled Meds:  docusate  100 mg Per Tube BID   famotidine  20 mg Per Tube Daily   insulin aspart  0-9 Units Subcutaneous Q4H   methylPREDNISolone injection  125 mg Intravenous Q12H   polyethylene glycol  17 g Per Tube Daily   Continuous Infusions:  propofol (DIPRIVAN) infusion 10 mcg/kg/min (08/09/22 0600)   PRN Meds:.docusate, mouth rinse, polyethylene glycol  Labs Reviewed: CBG ranges from 128-146 mg/dL over the last 24 hours  NUTRITION - FOCUSED PHYSICAL EXAM: Flowsheet Row Most Recent Value  Orbital Region Mild depletion  Upper Arm Region No depletion  Thoracic and Lumbar Region No depletion  Buccal Region No depletion  Temple Region Mild depletion  Clavicle Bone Region No depletion  Clavicle and Acromion Bone Region Mild depletion  Scapular Bone Region Mild depletion  Dorsal Hand No depletion  Patellar Region Mild depletion  Anterior Thigh Region Mild depletion  Posterior Calf Region Mild depletion  Edema (RD Assessment) None  Hair Reviewed  Eyes Reviewed  Mouth Reviewed  Skin Reviewed  Nails Reviewed   Diet Order:   Diet Order             Diet NPO time specified  Diet effective now                   EDUCATION NEEDS:   Education needs have been addressed  Skin:  Skin Assessment: Reviewed RN Assessment  Last BM:  unsure  Height:   Ht Readings from Last 1 Encounters:  08/08/22 5\' 3"  (1.6 m)    Weight:   Wt Readings from Last 1 Encounters:  08/09/22 74.6 kg    Ideal Body Weight:  52.3 kg  BMI:  Body mass index is 29.13 kg/m.  Estimated Nutritional Needs:  Kcal:  1500-1700 kcal/d Protein:  80-95g/d Fluid:  1.5L/d    Ranell Patrick, RD, LDN Clinical Dietitian RD pager # available in [facility]  After hours/weekend pager # available in [facility]

## 2022-08-09 NOTE — Progress Notes (Signed)
[facility] ADULT ICU REPLACEMENT PROTOCOL   The patient does apply for the [facility] Adult ICU Electrolyte Replacment Protocol based on the criteria listed below:   1.Exclusion criteria: TCTS, ECMO, Dialysis, and Myasthenia Gravis patients 2. Is GFR >/= 30 ml/min? Yes.    Patient's GFR today is >60 3. Is SCr </= 2? Yes.   Patient's SCr is 0.75 mg/dL 4. Did SCr increase >/= 0.5 in 24 hours? No. 5.Pt's weight >40kg  Yes.   6. Abnormal electrolyte(s): potassium 3.6  7. Electrolytes replaced per protocol 8.  Call MD STAT for K+ </= 2.5, Phos </= 1, or Mag </= 1 Physician:  protocol  Darlys Gales 08/09/2022 4:08 AM
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
## 2022-08-09 NOTE — Progress Notes (Addendum)
Rounding Note    Patient Name: Irissa Foglia Date of Encounter: 08/09/2022  [facility] Cardiologist: None   Subjective   Sitting up in bed, remains on vent. Alert, nods yes/no with questions. No chest pain.   Inpatient Medications    Scheduled Meds:  budesonide (PULMICORT) nebulizer solution  0.5 mg Nebulization BID   Chlorhexidine Gluconate Cloth  6 each Topical Q0600   docusate  100 mg Per Tube BID   insulin aspart  0-9 Units Subcutaneous Q4H   ipratropium-albuterol  3 mL Nebulization Q4H   methylPREDNISolone (SOLU-MEDROL) injection  125 mg Intravenous Q12H   montelukast  10 mg Per Tube QHS   mouth rinse  15 mL Mouth Rinse Q2H   pantoprazole (PROTONIX) IV  40 mg Intravenous Q24H   polyethylene glycol  17 g Per Tube Daily   sennosides  10 mL Per Tube BID   Continuous Infusions:  sodium chloride 10 mL/hr at 08/09/22 0900   fentaNYL infusion INTRAVENOUS 200 mcg/hr (08/09/22 0900)   ferric gluconate (FERRLECIT) IVPB     heparin 1,050 Units/hr (08/09/22 0937)   norepinephrine (LEVOPHED) Adult infusion Stopped (08/09/22 [id number])   propofol (DIPRIVAN) infusion Stopped (08/09/22 0820)   PRN Meds: docusate, fentaNYL, ipratropium-albuterol, midazolam, mouth rinse, polyethylene glycol   Vital Signs    Vitals:   08/09/22 0915 08/09/22 0916 08/09/22 0930 08/09/22 0945  BP: (!) 142/127 (!) 142/127  (!) 148/84  Pulse: 85 90 87 92  Resp: 20 (!) 21 16 18   Temp: 97.7 F (36.5 C) 97.7 F (36.5 C) 97.9 F (36.6 C) 97.9 F (36.6 C)  TempSrc:      SpO2: 96% 97% 96% 96%  Weight:      Height:        Intake/Output Summary (Last 24 hours) at 08/09/2022 0954 Last data filed at 08/09/2022 0900 Gross per 24 hour  Intake 1473.26 ml  Output 710 ml  Net 763.26 ml      08/09/2022    5:00 AM 08/08/2022    2:34 AM 08/07/2022   10:20 PM  Last 3 Weights  Weight (lbs) 164 lb 7.4 oz 161 lb 2.5 oz 153 lb 14.1 oz  Weight (kg) 74.6 kg 73.1 kg 69.8 kg      Telemetry     Sinus rhythm - Personally Reviewed  ECG    No new tracing this morning  Physical Exam   GEN: Sitting upright in bed, remains intubated but alert Neck: No JVD Cardiac: RRR, no murmurs, rubs, or gallops.  Respiratory: Clear to auscultation bilaterally. GI: Soft, nontender, non-distended  MS: No edema; No deformity. Neuro:  Nonfocal  Psych: Normal affect   Labs    High Sensitivity Troponin:   Recent Labs  Lab 08/07/22 2217 08/08/22 0002 08/08/22 0540 08/08/22 0846 08/09/22 0155  TROPONINIHS 60* 375* 743* 917* 350*     Chemistry Recent Labs  Lab 08/07/22 2217 08/07/22 2320 08/08/22 0107 08/08/22 0203 08/08/22 0304 08/09/22 0155  NA 140   < > 139  --  138 136  K 3.8   < > 3.5  --  3.8 3.6  CL 95*  --   --   --  99 99  CO2 34*  --   --   --  26 28  GLUCOSE 258*  --   --   --  208* 144*  BUN 19  --   --   --  17 11  CREATININE 1.26*  --   --  1.01*  0.95 0.75  CALCIUM 9.2  --   --   --  8.1* 8.4*  MG  --   --   --  3.0*  --   --   GFRNONAA 48*  --   --  >60 >60 >60  ANIONGAP 11  --   --   --  13 9   < > = values in this interval not displayed.    Lipids  Recent Labs  Lab 08/08/22 0304  TRIG 63    Hematology Recent Labs  Lab 08/08/22 0203 08/08/22 0304 08/09/22 0155  WBC 17.0* 18.2* 12.5*  RBC 3.53* 3.73* 3.47*  3.40*  HGB 10.1* 10.4* 9.7*  HCT 32.3* 33.9* 30.6*  MCV 91.5 90.9 88.2  MCH 28.6 27.9 28.0  MCHC 31.3 30.7 31.7  RDW 15.6* 15.8* 16.1*  PLT 259 230 227   Thyroid No results for input(s): "TSH", "FREET4" in the last 168 hours.  BNP Recent Labs  Lab 08/07/22 2217  BNP 185.2*    DDimer No results for input(s): "DDIMER" in the last 168 hours.   Radiology    DG Abd Portable 1V  Result Date: 08/08/2022 CLINICAL DATA:  Nasogastric placement EXAM: PORTABLE ABDOMEN - 1 VIEW COMPARISON:  02/01/2016 FINDINGS: Nasogastric tube enters the stomach, passes along the greater curvature with its tip at the antral body junction. IMPRESSION:  Nasogastric tube tip at the antral body junction. Electronically Signed   By: Nelson Chimes M.D.   On: 08/08/2022 10:36   CT Head Wo Contrast  Result Date: 08/08/2022 CLINICAL DATA:  Altered mental status and respiratory distress EXAM: CT HEAD WITHOUT CONTRAST TECHNIQUE: Contiguous axial images were obtained from the base of the skull through the vertex without intravenous contrast. RADIATION DOSE REDUCTION: This exam was performed according to the departmental dose-optimization program which includes automated exposure control, adjustment of the mA and/or kV according to patient size and/or use of iterative reconstruction technique. COMPARISON:  03/07/2015 FINDINGS: Brain: No evidence of acute infarction, hemorrhage, hydrocephalus, extra-axial collection or mass lesion/mass effect. Vascular: No hyperdense vessel or unexpected calcification. Skull: Normal. Negative for fracture or focal lesion. Sinuses/Orbits: No acute finding. Other: None. IMPRESSION: No acute intracranial abnormality noted. Electronically Signed   By: Inez Catalina M.D.   On: 08/08/2022 02:34   DG Chest Port 1 View  Result Date: 08/07/2022 CLINICAL DATA:  Shortness of breath. Respiratory distress. Unresponsive episode. History of COPD. EXAM: PORTABLE CHEST 1 VIEW COMPARISON:  05/04/2022 FINDINGS: An endotracheal tube has been placed with tip measuring 4.3 cm above the carina. An enteric tube was placed. Tip is off the field of view but below the left hemidiaphragm. Heart size and pulmonary vascularity are normal for technique. Emphysematous changes in the lungs with scattered fibrosis. Peribronchial thickening suggesting chronic bronchitis. No airspace disease or consolidation. No pleural effusions. No pneumothorax. Mediastinal contours appear intact. IMPRESSION: Appliances appear in satisfactory position. Emphysematous changes and chronic bronchitic changes in the lungs. No focal consolidation. Electronically Signed   By: Lucienne Capers  M.D.   On: 08/07/2022 22:37    Cardiac Studies   Echo: pending  Patient Profile     62 y.o. female  with a hx of non-obstructive CAD, hyperlipidemia w/ statin intolerance, severe COPD, who was seen 08/08/2022 for the evaluation of elevated troponin and ECG chagnes at the request of PCCM.  Assessment & Plan    Elevated troponin Hx of nonobstructive CAD -- hsTn peaked at 917 in the setting of acute illness/hypoxic respiratory failure,  hypotension. No chest pain.  Suspect demand ischemia, type II MI.  -- currently on IV heparin with plans for treatment for total of 48hrs -- echo pending  Acute hypoxic respiratory failure COPD exacerbation Chronic O2 use --She remains intubated, though alert and oriented at the time of assessment.  Failed SBT this morning due to apnea --Management per PCCM   For questions or updates, please contact [place] Please consult [email or web address] for contact info under        Signed, Reino Bellis, NP  08/09/2022, 9:54 AM    Agree with note by Reino Bellis NP-C  Patient seen yesterday for non-STEMI.  Her troponins peaked at 900.  Her propofol is been discontinued as has her Levophed.  Her blood pressures remained stable.  She remains intubated with plans to potentially extubate tomorrow.  2D echo is pending.  Suspect this is a "type II MI" secondary to demand ischemia from acute respiratory failure.  Will see again tomorrow.  Lorretta Harp, M.D., [place], [facility], [place] [address] [place], [place]  [address]  [phone] 08/09/2022 10:50 AM
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
## 2022-08-09 NOTE — Progress Notes (Signed)
eLink Physician-Brief Progress Note Patient Name: [name] DOB: [birth year] MRN: [id number]   Date of Service  08/09/2022  HPI/Events of Note  Patient with throat soreness from ET tube and likely related anxiety, asking for pain medications.  eICU Interventions  Discontinue Fentanyl gtt and reduce PRN pushes to 25 mcg, PRN Chloraseptic spray to back of throat (hold within 4 hours of extubation),  Xanax 0.25 mg via enteral tube x 1 for anxiety.        Kerry Kass Dandy Lazaro 08/09/2022, 11:06 PM

## 2022-08-10 ENCOUNTER — Inpatient Hospital Stay (HOSPITAL_COMMUNITY): Payer: BC Managed Care – PPO

## 2022-08-10 DIAGNOSIS — I5181 Takotsubo syndrome: Secondary | ICD-10-CM

## 2022-08-10 DIAGNOSIS — J9602 Acute respiratory failure with hypercapnia: Secondary | ICD-10-CM | POA: Diagnosis not present

## 2022-08-10 DIAGNOSIS — J9601 Acute respiratory failure with hypoxia: Secondary | ICD-10-CM | POA: Diagnosis not present

## 2022-08-10 LAB — CBC
HCT: 31.7 % — ABNORMAL LOW (ref 36.0–46.0)
Hemoglobin: 9.8 g/dL — ABNORMAL LOW (ref 12.0–15.0)
MCH: 27.8 pg (ref 26.0–34.0)
MCHC: 30.9 g/dL (ref 30.0–36.0)
MCV: 89.8 fL (ref 80.0–100.0)
Platelets: 205 10*3/uL (ref 150–400)
RBC: 3.53 MIL/uL — ABNORMAL LOW (ref 3.87–5.11)
RDW: 16.7 % — ABNORMAL HIGH (ref 11.5–15.5)
WBC: 10.5 10*3/uL (ref 4.0–10.5)
nRBC: 0 % (ref 0.0–0.2)

## 2022-08-10 LAB — BASIC METABOLIC PANEL
Anion gap: 15 (ref 5–15)
BUN: 19 mg/dL (ref 8–23)
CO2: 26 mmol/L (ref 22–32)
Calcium: 9 mg/dL (ref 8.9–10.3)
Chloride: 99 mmol/L (ref 98–111)
Creatinine, Ser: 0.68 mg/dL (ref 0.44–1.00)
GFR, Estimated: >60 mL/min (ref >60)
Glucose, Bld: 141 mg/dL — ABNORMAL HIGH (ref 70–99)
Potassium: 4.5 mmol/L (ref 3.5–5.1)
Sodium: 140 mmol/L (ref 135–145)

## 2022-08-10 LAB — GLUCOSE, CAPILLARY
Glucose-Capillary: 163 mg/dL — ABNORMAL HIGH (ref 70–99)
Glucose-Capillary: 139 mg/dL — ABNORMAL HIGH (ref 70–99)
Glucose-Capillary: 135 mg/dL — ABNORMAL HIGH (ref 70–99)
Glucose-Capillary: 161 mg/dL — ABNORMAL HIGH (ref 70–99)
Glucose-Capillary: 142 mg/dL — ABNORMAL HIGH (ref 70–99)
Glucose-Capillary: 188 mg/dL — ABNORMAL HIGH (ref 70–99)

## 2022-08-10 LAB — URINALYSIS, ROUTINE W REFLEX MICROSCOPIC
Bilirubin Urine: NEGATIVE
Glucose, UA: NEGATIVE mg/dL
Ketones, ur: NEGATIVE mg/dL
Leukocytes,Ua: NEGATIVE
Nitrite: NEGATIVE
Protein, ur: 100 mg/dL — AB
RBC / HPF: >50 RBC/hpf (ref 0–5)
Specific Gravity, Urine: 1.026 (ref 1.005–1.030)
pH: 5 (ref 5.0–8.0)

## 2022-08-10 LAB — HEPARIN LEVEL (UNFRACTIONATED): Heparin Unfractionated: 0.44 IU/mL (ref 0.30–0.70)

## 2022-08-10 LAB — PHOSPHORUS
Phosphorus: 3.7 mg/dL (ref 2.5–4.6)
Phosphorus: 3.8 mg/dL (ref 2.5–4.6)

## 2022-08-10 LAB — MAGNESIUM
Magnesium: 2.6 mg/dL — ABNORMAL HIGH (ref 1.7–2.4)
Magnesium: 2.5 mg/dL — ABNORMAL HIGH (ref 1.7–2.4)

## 2022-08-10 MED ORDER — FUROSEMIDE 10 MG/ML IJ SOLN
40.0000 mg | Freq: Once | INTRAMUSCULAR | Status: AC
  Administered 2022-08-10: 40 mg via INTRAVENOUS
  Filled 2022-08-10: qty 4

## 2022-08-10 MED ORDER — FUROSEMIDE 10 MG/ML IJ SOLN: 20.0000 mg | Freq: Once | INTRAMUSCULAR | Status: DC

## 2022-08-10 MED ORDER — CLONAZEPAM 0.5 MG PO TABS
0.5000 mg | ORAL_TABLET | Freq: Two times a day (BID) | ORAL | Status: DC
  Administered 2022-08-10 (×2): 0.5 mg
  Filled 2022-08-10 (×2): qty 1

## 2022-08-10 NOTE — Progress Notes (Signed)
Rounding Note    Patient Name: [name] Date of Encounter: 08/10/2022  [facility] Cardiologist: None   Subjective   Sitting up in bed, remains on vent. Alert, nods yes/no with questions. No chest pain.   Inpatient Medications    Scheduled Meds:  budesonide (PULMICORT) nebulizer solution  0.5 mg Nebulization BID   Chlorhexidine Gluconate Cloth  6 each Topical Q0600   docusate  100 mg Per Tube BID   feeding supplement (PROSource TF20)  60 mL Per Tube Daily   free water  200 mL Per Tube Q8H   guaiFENesin  5 mL Per Tube Q6H   insulin aspart  0-9 Units Subcutaneous Q4H   ipratropium-albuterol  3 mL Nebulization Q4H   methylPREDNISolone (SOLU-MEDROL) injection  125 mg Intravenous Q12H   montelukast  10 mg Per Tube QHS   mouth rinse  15 mL Mouth Rinse Q2H   pantoprazole (PROTONIX) IV  40 mg Intravenous Q24H   polyethylene glycol  17 g Per Tube Daily   sennosides  10 mL Per Tube BID   Continuous Infusions:  sodium chloride 10 mL/hr at 08/10/22 0800   dexmedetomidine (PRECEDEX) IV infusion 0.5 mcg/kg/hr (08/10/22 0913)   feeding supplement (OSMOLITE 1.2 CAL) Stopped (08/10/22 1020)   ferric gluconate (FERRLECIT) IVPB 250 mg (08/10/22 0958)   PRN Meds: fentaNYL (SUBLIMAZE) injection, ipratropium-albuterol, midazolam, mouth rinse   Vital Signs    Vitals:   08/10/22 0800 08/10/22 0815 08/10/22 0830 08/10/22 0845  BP: 122/73 112/70 114/72 106/68  Pulse: 66 70 69 64  Resp: 19 15 (!) 24 17  Temp: 98.2 F (36.8 C) 98.1 F (36.7 C) 98.1 F (36.7 C) 98.1 F (36.7 C)  TempSrc: Bladder     SpO2: 98% 97% 97% 97%  Weight:      Height:        Intake/Output Summary (Last 24 hours) at 08/10/2022 1054 Last data filed at 08/10/2022 0959 Gross per 24 hour  Intake 1594.29 ml  Output 835 ml  Net 759.29 ml      08/10/2022    5:00 AM 08/09/2022    5:00 AM 08/08/2022    2:34 AM  Last 3 Weights  Weight (lbs) 162 lb 0.6 oz 164 lb 7.4 oz 161 lb 2.5 oz  Weight (kg)  73.5 kg 74.6 kg 73.1 kg      Telemetry    Sinus rhythm - Personally Reviewed  ECG    No new tracing this morning  Physical Exam   GEN: Sitting upright in bed, remains intubated but alert Neck: No JVD Cardiac: RRR, no murmurs, rubs, or gallops.  Respiratory: Clear to auscultation bilaterally. GI: Soft, nontender, non-distended  MS: No edema; No deformity. Neuro:  Nonfocal  Psych: Normal affect   Labs    High Sensitivity Troponin:   Recent Labs  Lab 08/07/22 2217 08/08/22 0002 08/08/22 0540 08/08/22 0846 08/09/22 0155  TROPONINIHS 60* 375* 743* 917* 350*     Chemistry Recent Labs  Lab 08/08/22 0203 08/08/22 0304 08/09/22 0155 08/09/22 1819 08/10/22 0605  NA  --  138 136  --  140  K  --  3.8 3.6  --  4.5  CL  --  99 99  --  99  CO2  --  26 28  --  26  GLUCOSE  --  208* 144*  --  141*  BUN  --  17 11  --  19  CREATININE 1.01* 0.95 0.75  --  0.68  CALCIUM  --  8.1* 8.4*  --  9.0  MG 3.0*  --   --  2.5* 2.6*  GFRNONAA >60 >60 >60  --  >60  ANIONGAP  --  13 9  --  15    Lipids  Recent Labs  Lab 08/08/22 0304  TRIG 63    Hematology Recent Labs  Lab 08/08/22 0304 08/09/22 0155 08/10/22 0605  WBC 18.2* 12.5* 10.5  RBC 3.73* 3.47*  3.40* 3.53*  HGB 10.4* 9.7* 9.8*  HCT 33.9* 30.6* 31.7*  MCV 90.9 88.2 89.8  MCH 27.9 28.0 27.8  MCHC 30.7 31.7 30.9  RDW 15.8* 16.1* 16.7*  PLT 230 227 205   Thyroid No results for input(s): "TSH", "FREET4" in the last 168 hours.  BNP Recent Labs  Lab 08/07/22 2217  BNP 185.2*    DDimer No results for input(s): "DDIMER" in the last 168 hours.   Radiology    DG Abd Portable 1V  Result Date: 08/09/2022 CLINICAL DATA:  OG tube placement EXAM: PORTABLE ABDOMEN - 1 VIEW COMPARISON:  08/08/2022 FINDINGS: Mild airspace disease at the left greater than right lung base. Enteric tube tip incompletely visualized but suspected to overlie the distal stomach. IMPRESSION: Enteric tube tip incompletely visualized but  suspected to overlie the distal stomach. Electronically Signed   By: Donavan Foil M.D.   On: 08/09/2022 15:39   ECHOCARDIOGRAM COMPLETE  Result Date: 08/09/2022    ECHOCARDIOGRAM REPORT   Patient Name:   [name] Date of Exam: 08/09/2022 Medical Rec #:  [id number]       Height:       63.0 in Accession #:    [id number]      Weight:       164.5 lb Date of Birth:  1960/11/30       BSA:          1.779 m Patient Age:    2 years        BP:           93/58 mmHg Patient Gender: F               HR:           82 bpm. Exam Location:  Inpatient Procedure: 2D Echo, Cardiac Doppler, Color Doppler and Intracardiac            Opacification Agent Indications:     R07.9* Chest pain, unspecified. Elevated troponin.  History:         Patient has prior history of Echocardiogram examinations, most                  recent 08/18/2006. CAD, COPD, Signs/Symptoms:Dyspnea; Risk                  Factors:Dyslipidemia and Current Smoker. FLU positive.  Sonographer:     Roseanna Rainbow RDCS Referring Phys:  [id number] Lily Kocher Diagnosing Phys: Sanda Klein MD  Sonographer Comments: Echo performed with patient supine and on artificial respirator and suboptimal parasternal window. IMPRESSIONS  1. The wall motion abnormalities seen are not typical for coronary insufficiency and may represent a form of stress cardiomyopathy (takotsubo syndrome). Left ventricular ejection fraction, by estimation, is 40 to 45%. The left ventricle has mildly decreased function. The left ventricle demonstrates regional wall motion abnormalities (see scoring diagram/findings for description). Left ventricular diastolic parameters were normal. There is exaggerated ventricular septum displacement with breathing suggesting increased work of breathing (e.g. asthma or COPD exacerbation).  2. Right ventricular systolic function is normal.  The right ventricular size is mildly enlarged. There is mildly elevated pulmonary artery systolic pressure.  3. The mitral valve is  normal in structure. No evidence of mitral valve regurgitation.  4. The aortic valve is grossly normal. Aortic valve regurgitation is mild.  5. The inferior vena cava is dilated in size with >50% respiratory variability, suggesting right atrial pressure of 8 mmHg. Conclusion(s)/Recommendation(s): If coronary angiography is not performed, consider repeat study in 1-2 weeks. FINDINGS  Left Ventricle: The wall motion abnormalities seen are not typical for coronary insufficiency and may represent a form of stress cardiomyopathy (takotsubo syndrome). Left ventricular ejection fraction, by estimation, is 40 to 45%. The left ventricle has  mildly decreased function. The left ventricle demonstrates regional wall motion abnormalities. Definity contrast agent was given IV to delineate the left ventricular endocardial borders. The left ventricular internal cavity size was normal in size. There is no left ventricular hypertrophy. Left ventricular diastolic parameters were normal. Normal left ventricular filling pressure.  LV Wall Scoring: The apical lateral segment, mid anterolateral segment, apical anterior segment, and apex are hypokinetic. There is exaggerated ventricular septum displacement with breathing suggesting increased work of breathing (e.g. asthma or COPD exacerbation). Right Ventricle: The right ventricular size is mildly enlarged. No increase in right ventricular wall thickness. Right ventricular systolic function is normal. There is mildly elevated pulmonary artery systolic pressure. The tricuspid regurgitant velocity is 3.02 m/s, and with an assumed right atrial pressure of 8 mmHg, the estimated right ventricular systolic pressure is [id number]. Left Atrium: Left atrial size was normal in size. Right Atrium: Right atrial size was normal in size. Pericardium: There is no evidence of pericardial effusion. Mitral Valve: The mitral valve is normal in structure. No evidence of mitral valve regurgitation. Tricuspid  Valve: The tricuspid valve is normal in structure. Tricuspid valve regurgitation is trivial. Aortic Valve: The aortic valve is grossly normal. Aortic valve regurgitation is mild. Aortic regurgitation PHT measures 529 msec. Pulmonic Valve: The pulmonic valve was not well visualized. Pulmonic valve regurgitation is not visualized. Aorta: The aortic root and ascending aorta are structurally normal, with no evidence of dilitation. Venous: The inferior vena cava is dilated in size with greater than 50% respiratory variability, suggesting right atrial pressure of 8 mmHg. IAS/Shunts: No atrial level shunt detected by color flow Doppler.  LEFT VENTRICLE PLAX 2D LVIDd:         5.30 cm      Diastology LVIDs:         3.70 cm      LV e' medial:    6.64 cm/s LV PW:         0.90 cm      LV E/e' medial:  6.8 LV IVS:        1.00 cm      LV e' lateral:   7.51 cm/s LVOT diam:     2.20 cm      LV E/e' lateral: 6.0 LV SV:         67 LV SV Index:   37 LVOT Area:     3.80 cm  LV Volumes (MOD) LV vol d, MOD A2C: 136.0 ml LV vol d, MOD A4C: 135.5 ml LV vol s, MOD A2C: 71.2 ml LV vol s, MOD A4C: 67.7 ml LV SV MOD A2C:     64.8 ml LV SV MOD A4C:     135.5 ml LV SV MOD BP:      65.3 ml RIGHT VENTRICLE  IVC RV S prime:     14.40 cm/s  IVC diam: 2.50 cm TAPSE (M-mode): 1.6 cm LEFT ATRIUM             Index        RIGHT ATRIUM           Index LA diam:        3.30 cm 1.85 cm/m   RA Area:     11.80 cm LA Vol (A2C):   42.1 ml 23.66 ml/m  RA Volume:   30.50 ml  17.14 ml/m LA Vol (A4C):   29.1 ml 16.35 ml/m LA Biplane Vol: 34.7 ml 19.50 ml/m  AORTIC VALVE LVOT Vmax:   107.00 cm/s LVOT Vmean:  65.700 cm/s LVOT VTI:    0.175 m AI PHT:      529 msec  AORTA Ao Root diam: 3.10 cm Ao Asc diam:  3.80 cm MITRAL VALVE               TRICUSPID VALVE MV Area (PHT): 3.03 cm    TR Peak grad:   36.5 mmHg MV Decel Time: 250 msec    TR Vmax:        302.00 cm/s MV E velocity: 45.05 cm/s MV A velocity: 45.55 cm/s  SHUNTS MV E/A ratio:  0.99         Systemic VTI:  0.18 m                            Systemic Diam: 2.20 cm Dani Gobble Croitoru MD Electronically signed by Sanda Klein MD Signature Date/Time: 08/09/2022/1:43:30 PM    Final (Updated)     Cardiac Studies   Echo: 2D echocardiogram (08/09/2022)  IMPRESSIONS     1. The wall motion abnormalities seen are not typical for coronary  insufficiency and may represent a form of stress cardiomyopathy (takotsubo  syndrome). Left ventricular ejection fraction, by estimation, is 40 to  45%. The left ventricle has mildly  decreased function. The left ventricle demonstrates regional wall motion  abnormalities (see scoring diagram/findings for description). Left  ventricular diastolic parameters were normal. There is exaggerated  ventricular septum displacement with breathing  suggesting increased work of breathing (e.g. asthma or COPD exacerbation).   2. Right ventricular systolic function is normal. The right ventricular  size is mildly enlarged. There is mildly elevated pulmonary artery  systolic pressure.   3. The mitral valve is normal in structure. No evidence of mitral valve  regurgitation.   4. The aortic valve is grossly normal. Aortic valve regurgitation is  mild.   5. The inferior vena cava is dilated in size with >50% respiratory  variability, suggesting right atrial pressure of 8 mmHg.   Conclusion(s)/Recommendation(s): If coronary angiography is not performed,  consider repeat study in 1-2 weeks.   Patient Profile     62 y.o. female  with a hx of non-obstructive CAD, hyperlipidemia w/ statin intolerance, severe COPD, who was seen 08/08/2022 for the evaluation of elevated troponin and ECG chagnesnding at the request of PCCM.  Assessment & Plan    Elevated troponin Hx of nonobstructive CAD -- hsTn peaked at 917 in the setting of acute illness/hypoxic respiratory failure, hypotension. No chest pain.  Suspect demand ischemia, type II MI.  -- IV heparin was discontinued because  of hematuria -- 2D echo revealed EF in the 40 to 45% range with wall motion abnormality consistent with "Takotsubo syndrome".  Acute hypoxic respiratory failure COPD exacerbation  Chronic O2 use --She remains intubated, though alert and oriented at the time of assessment.  Failed SBT this morning due to apnea --Management per PCCM   For questions or updates, please contact [place] Please consult [email or web address] for contact info under        Signed, Quay Burow, MD  08/10/2022, 10:54 AM
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
## 2022-08-11 DIAGNOSIS — J9601 Acute respiratory failure with hypoxia: Secondary | ICD-10-CM | POA: Diagnosis not present

## 2022-08-11 DIAGNOSIS — J9602 Acute respiratory failure with hypercapnia: Secondary | ICD-10-CM | POA: Diagnosis not present

## 2022-08-11 LAB — BASIC METABOLIC PANEL
Anion gap: 10 (ref 5–15)
BUN: 22 mg/dL (ref 8–23)
CO2: 29 mmol/L (ref 22–32)
Calcium: 8.9 mg/dL (ref 8.9–10.3)
Chloride: 97 mmol/L — ABNORMAL LOW (ref 98–111)
Creatinine, Ser: 0.65 mg/dL (ref 0.44–1.00)
GFR, Estimated: >60 mL/min (ref >60)
Glucose, Bld: 170 mg/dL — ABNORMAL HIGH (ref 70–99)
Potassium: 4.3 mmol/L (ref 3.5–5.1)
Sodium: 136 mmol/L (ref 135–145)

## 2022-08-11 LAB — CBC
HCT: 34.6 % — ABNORMAL LOW (ref 36.0–46.0)
Hemoglobin: 10.8 g/dL — ABNORMAL LOW (ref 12.0–15.0)
MCH: 27.6 pg (ref 26.0–34.0)
MCHC: 31.2 g/dL (ref 30.0–36.0)
MCV: 88.5 fL (ref 80.0–100.0)
Platelets: 219 10*3/uL (ref 150–400)
RBC: 3.91 MIL/uL (ref 3.87–5.11)
RDW: 16.4 % — ABNORMAL HIGH (ref 11.5–15.5)
WBC: 11.7 10*3/uL — ABNORMAL HIGH (ref 4.0–10.5)
nRBC: 0 % (ref 0.0–0.2)

## 2022-08-11 LAB — MAGNESIUM: Magnesium: 2.5 mg/dL — ABNORMAL HIGH (ref 1.7–2.4)

## 2022-08-11 LAB — GLUCOSE, CAPILLARY
Glucose-Capillary: 170 mg/dL — ABNORMAL HIGH (ref 70–99)
Glucose-Capillary: 129 mg/dL — ABNORMAL HIGH (ref 70–99)
Glucose-Capillary: 161 mg/dL — ABNORMAL HIGH (ref 70–99)
Glucose-Capillary: 155 mg/dL — ABNORMAL HIGH (ref 70–99)
Glucose-Capillary: 154 mg/dL — ABNORMAL HIGH (ref 70–99)
Glucose-Capillary: 148 mg/dL — ABNORMAL HIGH (ref 70–99)

## 2022-08-11 LAB — PHOSPHORUS: Phosphorus: 3.6 mg/dL (ref 2.5–4.6)

## 2022-08-11 MED ORDER — ARFORMOTEROL TARTRATE 15 MCG/2ML IN NEBU
15.0000 ug | INHALATION_SOLUTION | Freq: Two times a day (BID) | RESPIRATORY_TRACT | Status: DC
  Administered 2022-08-11 – 2022-08-14 (×6): 15 ug via RESPIRATORY_TRACT
  Filled 2022-08-11 (×8): qty 2

## 2022-08-11 MED ORDER — IPRATROPIUM-ALBUTEROL 0.5-2.5 (3) MG/3ML IN SOLN: 3.0000 mL | RESPIRATORY_TRACT | Status: DC | PRN

## 2022-08-11 MED ORDER — IPRATROPIUM-ALBUTEROL 0.5-2.5 (3) MG/3ML IN SOLN
3.0000 mL | RESPIRATORY_TRACT | Status: DC | PRN
  Administered 2022-08-12 – 2022-08-14 (×6): 3 mL via RESPIRATORY_TRACT
  Filled 2022-08-11 (×6): qty 3

## 2022-08-11 MED ORDER — FENTANYL CITRATE PF 50 MCG/ML IJ SOSY
25.0000 ug | PREFILLED_SYRINGE | INTRAMUSCULAR | Status: DC | PRN
  Administered 2022-08-11 – 2022-08-12 (×3): 25 ug via INTRAVENOUS
  Filled 2022-08-11 (×3): qty 1

## 2022-08-11 MED ORDER — FUROSEMIDE 10 MG/ML IJ SOLN
40.0000 mg | Freq: Once | INTRAMUSCULAR | Status: AC
  Administered 2022-08-11: 40 mg via INTRAVENOUS
  Filled 2022-08-11: qty 4

## 2022-08-11 MED ORDER — LORAZEPAM 2 MG/ML IJ SOLN
INTRAMUSCULAR | Status: AC
  Administered 2022-08-11: 1 mg via INTRAVENOUS
  Filled 2022-08-11: qty 1

## 2022-08-11 MED ORDER — CLONAZEPAM 1 MG PO TABS
1.0000 mg | ORAL_TABLET | Freq: Two times a day (BID) | ORAL | Status: DC
  Administered 2022-08-11 – 2022-08-12 (×3): 1 mg
  Filled 2022-08-11 (×3): qty 1

## 2022-08-11 MED ORDER — OXYCODONE HCL 5 MG PO TABS
5.0000 mg | ORAL_TABLET | ORAL | Status: DC | PRN
  Administered 2022-08-11 (×2): 5 mg
  Filled 2022-08-11 (×2): qty 1

## 2022-08-11 MED ORDER — POLYETHYLENE GLYCOL 3350 17 G PO PACK
17.0000 g | PACK | Freq: Two times a day (BID) | ORAL | Status: DC
  Administered 2022-08-11 – 2022-08-12 (×3): 17 g
  Filled 2022-08-11 (×2): qty 1

## 2022-08-11 MED ORDER — SODIUM CHLORIDE 3 % IN NEBU
4.0000 mL | INHALATION_SOLUTION | Freq: Every day | RESPIRATORY_TRACT | Status: AC
  Administered 2022-08-11 – 2022-08-13 (×3): 4 mL via RESPIRATORY_TRACT
  Filled 2022-08-11 (×3): qty 4

## 2022-08-11 MED ORDER — LORAZEPAM 2 MG/ML IJ SOLN: 1.0000 mg | Freq: Once | INTRAMUSCULAR | Status: AC

## 2022-08-11 MED ORDER — FENTANYL CITRATE PF 50 MCG/ML IJ SOSY: 25.0000 ug | PREFILLED_SYRINGE | INTRAMUSCULAR | Status: DC | PRN

## 2022-08-11 MED ORDER — FENTANYL CITRATE PF 50 MCG/ML IJ SOSY
25.0000 ug | PREFILLED_SYRINGE | INTRAMUSCULAR | Status: DC | PRN
  Administered 2022-08-11 (×4): 25 ug via INTRAVENOUS
  Filled 2022-08-11 (×4): qty 1

## 2022-08-11 MED ORDER — REVEFENACIN 175 MCG/3ML IN SOLN
175.0000 ug | Freq: Every day | RESPIRATORY_TRACT | Status: DC
  Administered 2022-08-11 – 2022-08-14 (×4): 175 ug via RESPIRATORY_TRACT
  Filled 2022-08-11 (×4): qty 3

## 2022-08-11 MED ORDER — ARFORMOTEROL TARTRATE 15 MCG/2ML IN NEBU
15.0000 ug | INHALATION_SOLUTION | Freq: Two times a day (BID) | RESPIRATORY_TRACT | Status: DC
  Filled 2022-08-11: qty 2

## 2022-08-11 MED ORDER — ENOXAPARIN SODIUM 40 MG/0.4ML IJ SOSY
40.0000 mg | PREFILLED_SYRINGE | INTRAMUSCULAR | Status: DC
  Administered 2022-08-11 – 2022-08-14 (×4): 40 mg via SUBCUTANEOUS
  Filled 2022-08-11 (×4): qty 0.4

## 2022-08-11 NOTE — Progress Notes (Signed)
Pt placed back on PS/CPAP 8/5 on 40% and is tolerating well. RT will monitor.
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
## 2022-08-11 NOTE — Progress Notes (Addendum)
Rounding Note    Patient Name: [name] Date of Encounter: 08/11/2022  [facility] Cardiologist: None   Subjective   Sitting up in bed. She is still on the ventilator. Getting anxious whenever ventilator is weaned.   Inpatient Medications    Scheduled Meds:  arformoterol  15 mcg Nebulization BID   budesonide (PULMICORT) nebulizer solution  0.5 mg Nebulization BID   Chlorhexidine Gluconate Cloth  6 each Topical Q0600   clonazePAM  1 mg Per Tube BID   docusate  100 mg Per Tube BID   enoxaparin (LOVENOX) injection  40 mg Subcutaneous Q24H   feeding supplement (PROSource TF20)  60 mL Per Tube Daily   guaiFENesin  5 mL Per Tube Q6H   insulin aspart  0-9 Units Subcutaneous Q4H   methylPREDNISolone (SOLU-MEDROL) injection  125 mg Intravenous Q12H   montelukast  10 mg Per Tube QHS   mouth rinse  15 mL Mouth Rinse Q2H   pantoprazole (PROTONIX) IV  40 mg Intravenous Q24H   polyethylene glycol  17 g Per Tube BID   revefenacin  175 mcg Nebulization Daily   sennosides  10 mL Per Tube BID   sodium chloride HYPERTONIC  4 mL Nebulization Daily   Continuous Infusions:  sodium chloride 10 mL/hr at 08/11/22 0800   dexmedetomidine (PRECEDEX) IV infusion 1 mcg/kg/hr (08/11/22 1009)   feeding supplement (OSMOLITE 1.2 CAL) 55 mL/hr at 08/11/22 0800   ferric gluconate (FERRLECIT) IVPB 250 mg (08/11/22 0953)   PRN Meds: fentaNYL (SUBLIMAZE) injection, ipratropium-albuterol, midazolam, mouth rinse   Vital Signs    Vitals:   08/11/22 0741 08/11/22 0800 08/11/22 0815 08/11/22 0830  BP:  (!) 122/95  115/75  Pulse: 70 (!) 153  (!) 103  Resp: (!) 23 (!) 28  (!) 25  Temp:      TempSrc:      SpO2: 98% 97% 100% 98%  Weight:      Height:        Intake/Output Summary (Last 24 hours) at 08/11/2022 1109 Last data filed at 08/11/2022 1001 Gross per 24 hour  Intake 2641.6 ml  Output 1900 ml  Net 741.6 ml      08/11/2022    5:00 AM 08/10/2022    5:00 AM 08/09/2022    5:00 AM   Last 3 Weights  Weight (lbs) 163 lb 5.8 oz 162 lb 0.6 oz 164 lb 7.4 oz  Weight (kg) 74.1 kg 73.5 kg 74.6 kg      Telemetry    Sinus rhythm, tachycardic - Personally Reviewed  ECG    No new tracing this morning  Physical Exam   GEN: Sitting upright in bed, remains intubated but alert Neck: No JVD Cardiac: RRR, no murmurs, rubs, or gallops.  Respiratory: Clear to auscultation bilaterally. GI: Soft, nontender, non-distended  MS: No edema; No deformity. Neuro:  Nonfocal  Psych: Normal affect   Labs    High Sensitivity Troponin:   Recent Labs  Lab 08/07/22 2217 08/08/22 0002 08/08/22 0540 08/08/22 0846 08/09/22 0155  TROPONINIHS 60* 375* 743* 917* 350*     Chemistry Recent Labs  Lab 08/09/22 0155 08/09/22 1819 08/10/22 0605 08/10/22 1811 08/11/22 0634  NA 136  --  140  --  136  K 3.6  --  4.5  --  4.3  CL 99  --  99  --  97*  CO2 28  --  26  --  29  GLUCOSE 144*  --  141*  --  170*  BUN 11  --  19  --  22  CREATININE 0.75  --  0.68  --  0.65  CALCIUM 8.4*  --  9.0  --  8.9  MG  --    < > 2.6* 2.5* 2.5*  GFRNONAA >60  --  >60  --  >60  ANIONGAP 9  --  15  --  10   < > = values in this interval not displayed.    Lipids  Recent Labs  Lab 08/08/22 0304  TRIG 63    Hematology Recent Labs  Lab 08/09/22 0155 08/10/22 0605 08/11/22 0634  WBC 12.5* 10.5 11.7*  RBC 3.47*  3.40* 3.53* 3.91  HGB 9.7* 9.8* 10.8*  HCT 30.6* 31.7* 34.6*  MCV 88.2 89.8 88.5  MCH 28.0 27.8 27.6  MCHC 31.7 30.9 31.2  RDW 16.1* 16.7* 16.4*  PLT 227 205 219   Thyroid No results for input(s): "TSH", "FREET4" in the last 168 hours.  BNP Recent Labs  Lab 08/07/22 2217  BNP 185.2*    DDimer No results for input(s): "DDIMER" in the last 168 hours.   Radiology    DG CHEST PORT 1 VIEW  Result Date: 08/10/2022 CLINICAL DATA:  Shortness of breath EXAM: PORTABLE CHEST 1 VIEW COMPARISON:  Previous studies including the examination of 08/07/2022 FINDINGS: Cardiac size is  within normal limits. There are no signs of alveolar pulmonary edema. Tip of endotracheal tube is 5.2 cm above the carina. NG tube is noted traversing the esophagus. Small linear densities in right parahilar region may suggest minimal scarring. Subtle increase in interstitial markings in the lower lung fields have not changed significantly. There is blunting of left lateral CP angle. There is no pneumothorax. IMPRESSION: There are no signs of pulmonary edema or focal pulmonary consolidation. Small linear densities in right mid and both lower lung fields may suggest scarring or subsegmental atelectasis. Possible small left pleural effusion. Electronically Signed   By: Elmer Picker M.D.   On: 08/10/2022 12:07   DG Abd Portable 1V  Result Date: 08/09/2022 CLINICAL DATA:  OG tube placement EXAM: PORTABLE ABDOMEN - 1 VIEW COMPARISON:  08/08/2022 FINDINGS: Mild airspace disease at the left greater than right lung base. Enteric tube tip incompletely visualized but suspected to overlie the distal stomach. IMPRESSION: Enteric tube tip incompletely visualized but suspected to overlie the distal stomach. Electronically Signed   By: Donavan Foil M.D.   On: 08/09/2022 15:39   ECHOCARDIOGRAM COMPLETE  Result Date: 08/09/2022    ECHOCARDIOGRAM REPORT   Patient Name:   [name] Date of Exam: 08/09/2022 Medical Rec #:  [id number]       Height:       63.0 in Accession #:    [id number]      Weight:       164.5 lb Date of Birth:  10-19-1960       BSA:          1.779 m Patient Age:    62 years        BP:           93/58 mmHg Patient Gender: F               HR:           82 bpm. Exam Location:  Inpatient Procedure: 2D Echo, Cardiac Doppler, Color Doppler and Intracardiac            Opacification Agent Indications:     R07.9* Chest  pain, unspecified. Elevated troponin.  History:         Patient has prior history of Echocardiogram examinations, most                  recent 08/18/2006. CAD, COPD, Signs/Symptoms:Dyspnea;  Risk                  Factors:Dyslipidemia and Current Smoker. FLU positive.  Sonographer:     Roseanna Rainbow RDCS Referring Phys:  [id number] Lily Kocher Diagnosing Phys: Sanda Klein MD  Sonographer Comments: Echo performed with patient supine and on artificial respirator and suboptimal parasternal window. IMPRESSIONS  1. The wall motion abnormalities seen are not typical for coronary insufficiency and may represent a form of stress cardiomyopathy (takotsubo syndrome). Left ventricular ejection fraction, by estimation, is 40 to 45%. The left ventricle has mildly decreased function. The left ventricle demonstrates regional wall motion abnormalities (see scoring diagram/findings for description). Left ventricular diastolic parameters were normal. There is exaggerated ventricular septum displacement with breathing suggesting increased work of breathing (e.g. asthma or COPD exacerbation).  2. Right ventricular systolic function is normal. The right ventricular size is mildly enlarged. There is mildly elevated pulmonary artery systolic pressure.  3. The mitral valve is normal in structure. No evidence of mitral valve regurgitation.  4. The aortic valve is grossly normal. Aortic valve regurgitation is mild.  5. The inferior vena cava is dilated in size with >50% respiratory variability, suggesting right atrial pressure of 8 mmHg. Conclusion(s)/Recommendation(s): If coronary angiography is not performed, consider repeat study in 1-2 weeks. FINDINGS  Left Ventricle: The wall motion abnormalities seen are not typical for coronary insufficiency and may represent a form of stress cardiomyopathy (takotsubo syndrome). Left ventricular ejection fraction, by estimation, is 40 to 45%. The left ventricle has  mildly decreased function. The left ventricle demonstrates regional wall motion abnormalities. Definity contrast agent was given IV to delineate the left ventricular endocardial borders. The left ventricular internal cavity size  was normal in size. There is no left ventricular hypertrophy. Left ventricular diastolic parameters were normal. Normal left ventricular filling pressure.  LV Wall Scoring: The apical lateral segment, mid anterolateral segment, apical anterior segment, and apex are hypokinetic. There is exaggerated ventricular septum displacement with breathing suggesting increased work of breathing (e.g. asthma or COPD exacerbation). Right Ventricle: The right ventricular size is mildly enlarged. No increase in right ventricular wall thickness. Right ventricular systolic function is normal. There is mildly elevated pulmonary artery systolic pressure. The tricuspid regurgitant velocity is 3.02 m/s, and with an assumed right atrial pressure of 8 mmHg, the estimated right ventricular systolic pressure is [id number]. Left Atrium: Left atrial size was normal in size. Right Atrium: Right atrial size was normal in size. Pericardium: There is no evidence of pericardial effusion. Mitral Valve: The mitral valve is normal in structure. No evidence of mitral valve regurgitation. Tricuspid Valve: The tricuspid valve is normal in structure. Tricuspid valve regurgitation is trivial. Aortic Valve: The aortic valve is grossly normal. Aortic valve regurgitation is mild. Aortic regurgitation PHT measures 529 msec. Pulmonic Valve: The pulmonic valve was not well visualized. Pulmonic valve regurgitation is not visualized. Aorta: The aortic root and ascending aorta are structurally normal, with no evidence of dilitation. Venous: The inferior vena cava is dilated in size with greater than 50% respiratory variability, suggesting right atrial pressure of 8 mmHg. IAS/Shunts: No atrial level shunt detected by color flow Doppler.  LEFT VENTRICLE PLAX 2D LVIDd:  5.30 cm      Diastology LVIDs:         3.70 cm      LV e' medial:    6.64 cm/s LV PW:         0.90 cm      LV E/e' medial:  6.8 LV IVS:        1.00 cm      LV e' lateral:   7.51 cm/s LVOT diam:      2.20 cm      LV E/e' lateral: 6.0 LV SV:         67 LV SV Index:   37 LVOT Area:     3.80 cm  LV Volumes (MOD) LV vol d, MOD A2C: 136.0 ml LV vol d, MOD A4C: 135.5 ml LV vol s, MOD A2C: 71.2 ml LV vol s, MOD A4C: 67.7 ml LV SV MOD A2C:     64.8 ml LV SV MOD A4C:     135.5 ml LV SV MOD BP:      65.3 ml RIGHT VENTRICLE             IVC RV S prime:     14.40 cm/s  IVC diam: 2.50 cm TAPSE (M-mode): 1.6 cm LEFT ATRIUM             Index        RIGHT ATRIUM           Index LA diam:        3.30 cm 1.85 cm/m   RA Area:     11.80 cm LA Vol (A2C):   42.1 ml 23.66 ml/m  RA Volume:   30.50 ml  17.14 ml/m LA Vol (A4C):   29.1 ml 16.35 ml/m LA Biplane Vol: 34.7 ml 19.50 ml/m  AORTIC VALVE LVOT Vmax:   107.00 cm/s LVOT Vmean:  65.700 cm/s LVOT VTI:    0.175 m AI PHT:      529 msec  AORTA Ao Root diam: 3.10 cm Ao Asc diam:  3.80 cm MITRAL VALVE               TRICUSPID VALVE MV Area (PHT): 3.03 cm    TR Peak grad:   36.5 mmHg MV Decel Time: 250 msec    TR Vmax:        302.00 cm/s MV E velocity: 45.05 cm/s MV A velocity: 45.55 cm/s  SHUNTS MV E/A ratio:  0.99        Systemic VTI:  0.18 m                            Systemic Diam: 2.20 cm Dani Gobble Croitoru MD Electronically signed by Sanda Klein MD Signature Date/Time: 08/09/2022/1:43:30 PM    Final (Updated)     Cardiac Studies   Echo: IMPRESSIONS   1. The wall motion abnormalities seen are not typical for coronary  insufficiency and may represent a form of stress cardiomyopathy (takotsubo  syndrome). Left ventricular ejection fraction, by estimation, is 40 to  45%. The left ventricle has mildly  decreased function. The left ventricle demonstrates regional wall motion  abnormalities (see scoring diagram/findings for description). Left  ventricular diastolic parameters were normal. There is exaggerated  ventricular septum displacement with breathing  suggesting increased work of breathing (e.g. asthma or COPD exacerbation).   2. Right ventricular systolic function  is normal. The right ventricular  size is mildly enlarged. There is mildly elevated  pulmonary artery  systolic pressure.   3. The mitral valve is normal in structure. No evidence of mitral valve  regurgitation.   4. The aortic valve is grossly normal. Aortic valve regurgitation is  mild.   5. The inferior vena cava is dilated in size with >50% respiratory  variability, suggesting right atrial pressure of 8 mmHg.   Conclusion(s)/Recommendation(s): If coronary angiography is not performed,  consider repeat study in 1-2 weeks.   Patient Profile     62 y.o. female  with a hx of non-obstructive CAD, hyperlipidemia w/ statin intolerance, severe COPD, who was seen 08/08/2022 for the evaluation of elevated troponin and ECG chagnes at the request of PCCM.  Assessment & Plan    # Stress cardiomyopathy 40-45% with akinesis/wall motion abnormalities not typical for coronary insufficiency. Findings most consistent with takotsubo  - Will require GDMT. BP would likely tolerate ace/arb/arni. Would avoid BB given COPD and current respiratory status. GDMT can be started once extubated and able to ambulate to asess for tolerability. Can repeat ECHO in the outpatient setting to assess for resolution of cardiomyopathy and  necessity of GDMT. Does not need anticoagulation since there was not clot seen in apical portion.    Elevated troponin Hx of nonobstructive CAD Troponin peaked at 917 in the setting of acute illness/hypoxic respiratory failure, hypotension. No chest pain.  Suspect demand ischemia, type II MI.   Acute hypoxic respiratory failure COPD exacerbation Chronic O2 use She remains intubated, though alert and oriented at the time of assessment.  Failed SBT this morning  --Management per [facility]  Cardiology will sign off.   Signed, Delene Ruffini, MD  08/11/2022, 11:09 AM     Agree with note by [facility]  Patient mated with respiratory failure.  She is currently still intubated.  Her  enzymes peaked at 900.  Echo consistent with Takotsubo cardiomyopathy.  Pulmonary critical care is managing.  She is hemodynamically stable.  When she is extubated and taking oral medications please reach back out to [place] to initiate guideline directed ductal medical therapy.  Lorretta Harp, M.D., [place], [facility], [place]ta [place] [address] [place], [place]  [address]  [phone] 08/11/2022 12:19 PM

## 2022-08-11 NOTE — Progress Notes (Addendum)
Pt placed on PS/CPAP 8/5 on 40% and is tolerating well. RT will monitor. 

## 2022-08-11 NOTE — Progress Notes (Signed)
Pt placed back on full support due to increased WOB. RT will monitor.  

## 2022-08-12 ENCOUNTER — Inpatient Hospital Stay (HOSPITAL_COMMUNITY): Payer: BC Managed Care – PPO

## 2022-08-12 ENCOUNTER — Other Ambulatory Visit: Payer: Self-pay | Admitting: Cardiology

## 2022-08-12 DIAGNOSIS — J9601 Acute respiratory failure with hypoxia: Secondary | ICD-10-CM | POA: Diagnosis not present

## 2022-08-12 DIAGNOSIS — J9602 Acute respiratory failure with hypercapnia: Secondary | ICD-10-CM | POA: Diagnosis not present

## 2022-08-12 LAB — GLUCOSE, CAPILLARY
Glucose-Capillary: 126 mg/dL — ABNORMAL HIGH (ref 70–99)
Glucose-Capillary: 155 mg/dL — ABNORMAL HIGH (ref 70–99)
Glucose-Capillary: 124 mg/dL — ABNORMAL HIGH (ref 70–99)
Glucose-Capillary: 100 mg/dL — ABNORMAL HIGH (ref 70–99)
Glucose-Capillary: 120 mg/dL — ABNORMAL HIGH (ref 70–99)
Glucose-Capillary: 111 mg/dL — ABNORMAL HIGH (ref 70–99)
Glucose-Capillary: 99 mg/dL (ref 70–99)

## 2022-08-12 LAB — CULTURE, BLOOD (ROUTINE X 2): Culture: NO GROWTH

## 2022-08-12 LAB — BASIC METABOLIC PANEL
Anion gap: 11 (ref 5–15)
BUN: 22 mg/dL (ref 8–23)
CO2: 29 mmol/L (ref 22–32)
Calcium: 9 mg/dL (ref 8.9–10.3)
Chloride: 95 mmol/L — ABNORMAL LOW (ref 98–111)
Creatinine, Ser: 0.68 mg/dL (ref 0.44–1.00)
GFR, Estimated: >60 mL/min (ref >60)
Glucose, Bld: 173 mg/dL — ABNORMAL HIGH (ref 70–99)
Potassium: 4.5 mmol/L (ref 3.5–5.1)
Sodium: 135 mmol/L (ref 135–145)

## 2022-08-12 LAB — CBC
HCT: 34.6 % — ABNORMAL LOW (ref 36.0–46.0)
Hemoglobin: 10.7 g/dL — ABNORMAL LOW (ref 12.0–15.0)
MCH: 27.8 pg (ref 26.0–34.0)
MCHC: 30.9 g/dL (ref 30.0–36.0)
MCV: 89.9 fL (ref 80.0–100.0)
Platelets: 206 10*3/uL (ref 150–400)
RBC: 3.85 MIL/uL — ABNORMAL LOW (ref 3.87–5.11)
RDW: 16.5 % — ABNORMAL HIGH (ref 11.5–15.5)
WBC: 10.5 10*3/uL (ref 4.0–10.5)
nRBC: 0 % (ref 0.0–0.2)

## 2022-08-12 MED ORDER — FUROSEMIDE 10 MG/ML IJ SOLN: 40.0000 mg | Freq: Once | INTRAMUSCULAR | Status: DC

## 2022-08-12 MED ORDER — FUROSEMIDE 10 MG/ML IJ SOLN
40.0000 mg | Freq: Once | INTRAMUSCULAR | Status: AC
  Administered 2022-08-12: 40 mg via INTRAVENOUS
  Filled 2022-08-12: qty 4

## 2022-08-12 MED ORDER — LORAZEPAM 2 MG/ML IJ SOLN
0.5000 mg | Freq: Once | INTRAMUSCULAR | Status: AC | PRN
  Administered 2022-08-13: 0.5 mg via INTRAMUSCULAR
  Filled 2022-08-12: qty 1

## 2022-08-12 MED ORDER — POLYETHYLENE GLYCOL 3350 17 G PO PACK
17.0000 g | PACK | Freq: Two times a day (BID) | ORAL | Status: DC
  Administered 2022-08-13 – 2022-08-14 (×3): 17 g via ORAL
  Filled 2022-08-12 (×3): qty 1

## 2022-08-12 MED ORDER — KETOROLAC TROMETHAMINE 15 MG/ML IJ SOLN
15.0000 mg | Freq: Once | INTRAMUSCULAR | Status: AC
  Administered 2022-08-12: 15 mg via INTRAVENOUS
  Filled 2022-08-12: qty 1

## 2022-08-12 MED ORDER — SORBITOL 70 % SOLN: 960.0000 mL | TOPICAL_OIL | Freq: Once | ORAL | Status: DC | PRN

## 2022-08-12 MED ORDER — DOCUSATE SODIUM 50 MG/5ML PO LIQD
100.0000 mg | Freq: Two times a day (BID) | ORAL | Status: DC
  Administered 2022-08-12: 100 mg
  Filled 2022-08-12: qty 10

## 2022-08-12 MED ORDER — SENNOSIDES 8.8 MG/5ML PO SYRP
10.0000 mL | ORAL_SOLUTION | Freq: Two times a day (BID) | ORAL | Status: DC
  Administered 2022-08-12: 10 mL
  Filled 2022-08-12: qty 10

## 2022-08-12 MED ORDER — MONTELUKAST SODIUM 10 MG PO TABS
10.0000 mg | ORAL_TABLET | Freq: Every day | ORAL | Status: DC
  Administered 2022-08-13: 10 mg via ORAL
  Filled 2022-08-12: qty 1

## 2022-08-12 MED ORDER — SENNA 8.6 MG PO TABS
1.0000 | ORAL_TABLET | Freq: Two times a day (BID) | ORAL | Status: DC
  Administered 2022-08-13 – 2022-08-14 (×2): 8.6 mg via ORAL
  Filled 2022-08-12 (×2): qty 1

## 2022-08-12 MED ORDER — ORAL CARE MOUTH RINSE
15.0000 mL | OROMUCOSAL | Status: DC
  Administered 2022-08-12: 15 mL via OROMUCOSAL

## 2022-08-12 MED ORDER — SENNOSIDES-DOCUSATE SODIUM 8.6-50 MG PO TABS: 1.0000 | ORAL_TABLET | Freq: Two times a day (BID) | ORAL | Status: DC

## 2022-08-12 MED ORDER — BISACODYL 10 MG RE SUPP
10.0000 mg | Freq: Once | RECTAL | Status: AC
  Administered 2022-08-12: 10 mg via RECTAL
  Filled 2022-08-12: qty 1

## 2022-08-12 MED ORDER — GLYCOPYRROLATE 0.2 MG/ML IJ SOLN
0.1000 mg | Freq: Once | INTRAMUSCULAR | Status: AC
  Administered 2022-08-12: 0.1 mg via INTRAVENOUS
  Filled 2022-08-12: qty 1

## 2022-08-12 MED ORDER — ORAL CARE MOUTH RINSE: 15.0000 mL | OROMUCOSAL | Status: DC | PRN

## 2022-08-12 MED ORDER — DOCUSATE SODIUM 100 MG PO CAPS
100.0000 mg | ORAL_CAPSULE | Freq: Two times a day (BID) | ORAL | Status: DC
  Administered 2022-08-13 – 2022-08-14 (×2): 100 mg via ORAL
  Filled 2022-08-12 (×2): qty 1

## 2022-08-12 NOTE — Progress Notes (Signed)
eLink Physician-Brief Progress Note Patient Name: [name] DOB: 30-Sep-1960 MRN: [id number]   Date of Service  Aug 20, 2022  HPI/Events of Note  Patient vomited and bedside RN is concerned that vomitus appearing secretions are in the ET tube, breathing a bit more labored per RN, abdomen is soft with bowel sounds.  eICU Interventions  CXR  r/o aspiration, KUB, pause enteral nutrition pending results of imaging studies.        Veleda Mun U Shantae Vantol 08-20-22, 1:25 AM

## 2022-08-12 NOTE — Progress Notes (Signed)
eLink Physician-Brief Progress Note Patient Name: [name] DOB: [birth year] MRN: [id number]   Date of Service  08/12/2022  HPI/Events of Note  Extubated earlier today and failed a swallow evaluation, has secretion issues, and is complaining of some musculoskeletal pain complaints, she also has anxiety and had been on klonopin prior to extubation.  eICU Interventions  Robinul 0.1 mg iv x 1, Toradol 15 mg iv x 1, Ativan 0.5 mg IM x 1.        Kerry Kass Rajanee Schuelke 08/12/2022, 8:11 PM

## 2022-08-12 NOTE — Procedures (Signed)
Extubation Procedure Note  Patient Details:   Name: [name] DOB: 06/23/1960 MRN: [id number]   Airway Documentation:    Vent end date: 08/12/22 Vent end time: 1135   Evaluation  O2 sats: stable throughout Complications: No apparent complications Patient did tolerate procedure well. Bilateral Breath Sounds: Diminished, Clear   Yes  Pt extubated to 6l [place] with RN at bedside. No cuff leak noted, MD Loanne Drilling aware and to proceed with extubation. Pt is tolerating well. RT will monitor.  Racheal Patches 08/12/2022, 11:41 AM
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
## 2022-08-12 NOTE — Progress Notes (Signed)
Pt having coughing spell after eating and Spo2 dropped to the 85. Pt made NPO and MD notified.

## 2022-08-13 ENCOUNTER — Other Ambulatory Visit: Payer: Self-pay | Admitting: Cardiology

## 2022-08-13 DIAGNOSIS — J441 Chronic obstructive pulmonary disease with (acute) exacerbation: Secondary | ICD-10-CM | POA: Diagnosis not present

## 2022-08-13 LAB — CULTURE, BLOOD (ROUTINE X 2)
Culture: NO GROWTH
Special Requests: ADEQUATE

## 2022-08-13 LAB — BASIC METABOLIC PANEL
Anion gap: 11 (ref 5–15)
BUN: 28 mg/dL — ABNORMAL HIGH (ref 8–23)
CO2: 34 mmol/L — ABNORMAL HIGH (ref 22–32)
Calcium: 9.2 mg/dL (ref 8.9–10.3)
Chloride: 95 mmol/L — ABNORMAL LOW (ref 98–111)
Creatinine, Ser: 0.67 mg/dL (ref 0.44–1.00)
GFR, Estimated: >60 mL/min (ref >60)
Glucose, Bld: 91 mg/dL (ref 70–99)
Potassium: 4 mmol/L (ref 3.5–5.1)
Sodium: 140 mmol/L (ref 135–145)

## 2022-08-13 LAB — GLUCOSE, CAPILLARY
Glucose-Capillary: 73 mg/dL (ref 70–99)
Glucose-Capillary: 87 mg/dL (ref 70–99)
Glucose-Capillary: 98 mg/dL (ref 70–99)
Glucose-Capillary: 120 mg/dL — ABNORMAL HIGH (ref 70–99)
Glucose-Capillary: 97 mg/dL (ref 70–99)

## 2022-08-13 LAB — CBC
HCT: 37.4 % (ref 36.0–46.0)
Hemoglobin: 11.6 g/dL — ABNORMAL LOW (ref 12.0–15.0)
MCH: 27.8 pg (ref 26.0–34.0)
MCHC: 31 g/dL (ref 30.0–36.0)
MCV: 89.7 fL (ref 80.0–100.0)
Platelets: 211 10*3/uL (ref 150–400)
RBC: 4.17 MIL/uL (ref 3.87–5.11)
RDW: 16.3 % — ABNORMAL HIGH (ref 11.5–15.5)
WBC: 11.1 10*3/uL — ABNORMAL HIGH (ref 4.0–10.5)
nRBC: 0 % (ref 0.0–0.2)

## 2022-08-13 LAB — MAGNESIUM: Magnesium: 2.5 mg/dL — ABNORMAL HIGH (ref 1.7–2.4)

## 2022-08-13 MED ORDER — PREDNISONE 20 MG PO TABS: 40.0000 mg | ORAL_TABLET | Freq: Every day | ORAL | Status: DC

## 2022-08-13 MED ORDER — PREDNISONE 50 MG PO TABS: 50.0000 mg | ORAL_TABLET | Freq: Every day | ORAL | Status: DC

## 2022-08-13 MED ORDER — HYDROCOD POLI-CHLORPHE POLI ER 10-8 MG/5ML PO SUER
5.0000 mL | Freq: Two times a day (BID) | ORAL | Status: DC | PRN
  Administered 2022-08-13: 5 mL via ORAL
  Filled 2022-08-13: qty 5

## 2022-08-13 MED ORDER — LORAZEPAM 2 MG/ML IJ SOLN
1.0000 mg | Freq: Once | INTRAMUSCULAR | Status: AC
  Administered 2022-08-13: 1 mg via INTRAMUSCULAR
  Filled 2022-08-13: qty 1

## 2022-08-13 MED ORDER — GUAIFENESIN 100 MG/5ML PO LIQD: 5.0000 mL | ORAL | Status: DC | PRN

## 2022-08-13 MED ORDER — PREDNISONE 10 MG PO TABS: 10.0000 mg | ORAL_TABLET | Freq: Every day | ORAL | Status: DC

## 2022-08-13 MED ORDER — CLONAZEPAM 0.5 MG PO TABS
1.0000 mg | ORAL_TABLET | Freq: Two times a day (BID) | ORAL | Status: DC
  Administered 2022-08-13 – 2022-08-14 (×3): 1 mg via ORAL
  Filled 2022-08-13: qty 2
  Filled 2022-08-13: qty 1
  Filled 2022-08-13: qty 2

## 2022-08-13 MED ORDER — GLYCOPYRROLATE 0.2 MG/ML IJ SOLN
0.1000 mg | Freq: Two times a day (BID) | INTRAMUSCULAR | Status: DC | PRN
  Administered 2022-08-13 (×2): 0.1 mg via INTRAVENOUS
  Filled 2022-08-13 (×2): qty 1

## 2022-08-13 MED ORDER — DEXTROSE IN LACTATED RINGERS 5 % IV SOLN: INTRAVENOUS | Status: AC

## 2022-08-13 MED ORDER — PREDNISONE 20 MG PO TABS: 30.0000 mg | ORAL_TABLET | Freq: Every day | ORAL | Status: DC

## 2022-08-13 MED ORDER — GLYCOPYRROLATE 0.2 MG/ML IJ SOLN
0.1000 mg | Freq: Once | INTRAMUSCULAR | Status: AC
  Administered 2022-08-13: 0.1 mg via INTRAVENOUS
  Filled 2022-08-13: qty 1

## 2022-08-13 MED ORDER — KETOROLAC TROMETHAMINE 15 MG/ML IJ SOLN
7.5000 mg | Freq: Once | INTRAMUSCULAR | Status: AC | PRN
  Administered 2022-08-13: 7.5 mg via INTRAVENOUS
  Filled 2022-08-13: qty 1

## 2022-08-13 MED ORDER — MELATONIN 5 MG PO TABS
5.0000 mg | ORAL_TABLET | Freq: Every day | ORAL | Status: DC
  Administered 2022-08-13: 5 mg via ORAL
  Filled 2022-08-13: qty 1

## 2022-08-13 MED ORDER — HYDRALAZINE HCL 20 MG/ML IJ SOLN: 10.0000 mg | Freq: Four times a day (QID) | INTRAMUSCULAR | Status: DC | PRN

## 2022-08-13 MED ORDER — PREDNISONE 20 MG PO TABS: 20.0000 mg | ORAL_TABLET | Freq: Every day | ORAL | Status: DC
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
## 2022-08-13 NOTE — Progress Notes (Signed)
eLink Physician-Brief Progress Note Patient Name: [name] DOB: July 20, 1960 MRN: [id number]   Date of Service  08/13/2022  HPI/Events of Note  Patient with persistent insomnia, earlier dose of 0.5 mg of Ativan IM did not leave her significantly drowsy.  eICU Interventions  Ativan 1 mg IM x 1 ordered.        Kerry Kass Ugo Thoma 08/13/2022, 3:40 AM

## 2022-08-13 NOTE — Progress Notes (Signed)
Pt stable for transfer and vitals w/in order set

## 2022-08-13 NOTE — Telephone Encounter (Signed)
PT. Currently in [place] @ [facility] if we could please f/u with pt.

## 2022-08-13 NOTE — Evaluation (Signed)
Physical Therapy Evaluation Patient Details Name: [name] MRN: [id number] DOB: [birth year] Today's Date: 08/13/2022  History of Present Illness  62 year old female with pertinent PMH COPD on 5 LNC at home, HTN, HLD, CAD presents to [facility] ED on 3/24 respiratory distress. Patient intubated and placed on vent.  ABG obtained showing severe respiratory acidosis. Extubated 3/29 with subsequent failing of swallow screening with desaturation after choking incident.  Clinical Impression  Pt admitted with above diagnosis. Pt was able to ambulate with RW with min assist, 2nd person for safety. Pt reports she recently obtained a Rollator.  Discussed safety with pt and that PT recommends her use rollator until she can gain strength and endurance.  Pt should progress well.  Will follow acutely. Pt currently with functional limitations due to the deficits listed below (see PT Problem List). Pt will benefit from acute skilled PT to increase their independence and safety with mobility to allow discharge.          Recommendations for follow up therapy are one component of a multi-disciplinary discharge planning process, led by the attending physician.  Recommendations may be updated based on patient status, additional functional criteria and insurance authorization.  Follow Up Recommendations       Assistance Recommended at Discharge PRN  Patient can return home with the following  Assistance with cooking/housework;Assist for transportation;Help with stairs or ramp for entrance    Equipment Recommendations None recommended by PT  Recommendations for Other Services       Functional Status Assessment Patient has had a recent decline in their functional status and demonstrates the ability to make significant improvements in function in a reasonable and predictable amount of time.     Precautions / Restrictions Precautions Precautions: Fall Restrictions Weight Bearing Restrictions: No       Mobility  Bed Mobility Overal bed mobility: Needs Assistance Bed Mobility: Supine to Sit     Supine to sit: Min guard     General bed mobility comments: Slow moving but able to get to eOB on her own.    Transfers Overall transfer level: Needs assistance Equipment used: Rolling walker (2 wheels) Transfers: Sit to/from Stand Sit to Stand: Min assist           General transfer comment: Assist for power up and steadying and cues for hand placement    Ambulation/Gait Ambulation/Gait assistance: Min assist, +2 safety/equipment Gait Distance (Feet): 150 Feet Assistive device: Rolling walker (2 wheels) Gait Pattern/deviations: Step-through pattern, Decreased stride length, Trunk flexed, Wide base of support   Gait velocity interpretation: <1.31 ft/sec, indicative of household ambulator   General Gait Details: Pt was able to progress ambulation needing cues for staying inside RW. Pt more steady with UE support.  Stairs            Wheelchair Mobility    Modified Rankin (Stroke Patients Only)       Balance Overall balance assessment: Needs assistance Sitting-balance support: No upper extremity supported, Feet supported Sitting balance-Leahy Scale: Fair     Standing balance support: Bilateral upper extremity supported, During functional activity Standing balance-Leahy Scale: Poor Standing balance comment: relies on UE support and RW                             Pertinent Vitals/Pain Pain Assessment Pain Assessment: No/denies pain    Home Living Family/patient expects to be discharged to:: Private residence Living Arrangements: Other relatives Available Help at Discharge: Family;Available  PRN/intermittently (granddaughter 34 year old in school, Works full time at [employer]) Type of Home: Apartment Home Access: Level entry (1 step from parking lot to complex (??Curb))       Home Layout: One level Home Equipment: Rollator (4 wheels) (5LO2 at  home)      Prior Function Prior Level of Function : Independent/Modified Independent             Mobility Comments: She was independent with ambulation without device ADLs Comments: She reported being modified independent to independent with ADLs and household chores. She drives.     Hand Dominance   Dominant Hand: Right    Extremity/Trunk Assessment   Upper Extremity Assessment Upper Extremity Assessment: Defer to OT evaluation    Lower Extremity Assessment Lower Extremity Assessment: Generalized weakness    Cervical / Trunk Assessment Cervical / Trunk Assessment: Kyphotic  Communication   Communication: No difficulties  Cognition Arousal/Alertness: Awake/alert Behavior During Therapy: WFL for tasks assessed/performed Overall Cognitive Status: Within Functional Limits for tasks assessed                                          General Comments General comments (skin integrity, edema, etc.): VSS    Exercises     Assessment/Plan    PT Assessment Patient needs continued PT services  PT Problem List Decreased activity tolerance;Decreased balance;Decreased mobility;Decreased knowledge of use of DME;Decreased safety awareness;Decreased knowledge of precautions;Cardiopulmonary status limiting activity       PT Treatment Interventions DME instruction;Gait training;Functional mobility training;Therapeutic activities;Therapeutic exercise;Balance training;Patient/family education    PT Goals (Current goals can be found in the Care Plan section)  Acute Rehab PT Goals Patient Stated Goal: to go home PT Goal Formulation: With patient Time For Goal Achievement: 08/26/22 Potential to Achieve Goals: Good    Frequency Min 3X/week     Co-evaluation PT/OT/SLP Co-Evaluation/Treatment: Yes Reason for Co-Treatment: Complexity of the patient's impairments (multi-system involvement);For patient/therapist safety PT goals addressed during session:  Mobility/safety with mobility         AM-PAC PT "6 Clicks" Mobility  Outcome Measure Help needed turning from your back to your side while in a flat bed without using bedrails?: None Help needed moving from lying on your back to sitting on the side of a flat bed without using bedrails?: None Help needed moving to and from a bed to a chair (including a wheelchair)?: A Little Help needed standing up from a chair using your arms (e.g., wheelchair or bedside chair)?: A Little Help needed to walk in hospital room?: A Little Help needed climbing 3-5 steps with a railing? : A Lot 6 Click Score: 19    End of Session Equipment Utilized During Treatment: Gait belt;Oxygen Activity Tolerance: Patient tolerated treatment well Patient left: in chair;with call bell/phone within reach;with chair alarm set Nurse Communication: Mobility status PT Visit Diagnosis: Unsteadiness on feet (R26.81);Muscle weakness (generalized) (M62.81)    Time: [id number] PT Time Calculation (min) (ACUTE ONLY): 34 min   Charges:   PT Evaluation $PT Eval Moderate Complexity: 1 Mod          Anjolina Byrer M,PT Acute Rehab Services [phone]   Alvira Philips 08/13/2022, 1:56 PM

## 2022-08-13 NOTE — Progress Notes (Signed)
CPT held at this time. Patient just finished walking around the ICU & now is sitting up in chair. We're doing CPT through the bed.

## 2022-08-13 NOTE — Progress Notes (Signed)
eLink Physician-Brief Progress Note Patient Name: [name] DOB: 04-02-1961 MRN: [id number]   Date of Service  08/13/2022  HPI/Events of Note  Blood sugar trending down, latest one [place] 73 mg / dl.  eICU Interventions  D 5 LR gtt ordered at 75 ml / hour.        Kerry Kass [place] Saber 08/13/2022, 4:10 AM

## 2022-08-13 NOTE — Evaluation (Signed)
Occupational Therapy Evaluation Patient Details Name: [name] MRN: [id number] DOB: 05-Nov-1960 Today's Date: 08/13/2022   History of Present Illness Pt is a 62 y/o female presenting on 3/24 with respiratory distress. Arrived unresponsive with EMS and intubated. Pt with COPD exacerbation due to metapneumovirus, type II NSTEMI suspect elevated troponin from demand ischemia per cardiology. Extubated 3/29. PMH includes: CAD, COPD, hepatitis C, obesity, substance abuse.   Clinical Impression   PTA patient reports independent, driving and working. Admitted for above and presents with problem list below.  She completes bed mobility with min guard, transfers with min assist, functional mobility using RW with min assist to min guard and ADLs with up to min guard assist.  She requires increased time, cueing for PLB and intermittent cueing for safety with RW.  She is 5L O2 via [place] initially, increased to 6L during mobility with VSS during session. Initiated energy conservation education.  Believe she will progress well acutely with no further needs required after dc. Will follow.      Recommendations for follow up therapy are one component of a multi-disciplinary discharge planning process, led by the attending physician.  Recommendations may be updated based on patient status, additional functional criteria and insurance authorization.   Assistance Recommended at Discharge PRN  Patient can return home with the following A little help with walking and/or transfers;A little help with bathing/dressing/bathroom;Assistance with cooking/housework;Assist for transportation;Help with stairs or ramp for entrance    Functional Status Assessment  Patient has had a recent decline in their functional status and demonstrates the ability to make significant improvements in function in a reasonable and predictable amount of time.  Equipment Recommendations  Tub/shower seat    Recommendations for Other Services        Precautions / Restrictions Precautions Precautions: Fall Restrictions Weight Bearing Restrictions: No      Mobility Bed Mobility Overal bed mobility: Needs Assistance Bed Mobility: Supine to Sit     Supine to sit: Min guard     General bed mobility comments: increased time, no assist required    Transfers Overall transfer level: Needs assistance Equipment used: Rolling walker (2 wheels) Transfers: Sit to/from Stand Sit to Stand: Min assist           General transfer comment: Assist for power up and steadying and cues for hand placement      Balance Overall balance assessment: Needs assistance Sitting-balance support: No upper extremity supported, Feet supported Sitting balance-Leahy Scale: Fair     Standing balance support: Bilateral upper extremity supported, During functional activity Standing balance-Leahy Scale: Poor Standing balance comment: relies on UE support and RW                           ADL either performed or assessed with clinical judgement   ADL Overall ADL's : Needs assistance/impaired     Grooming: Min guard;Standing           Upper Body Dressing : Set up;Sitting   Lower Body Dressing: Min guard;Sit to/from stand Lower Body Dressing Details (indicate cue type and reason): socks from bed level setup, min guard in standing Toilet Transfer: Min guard;Minimal assistance;Ambulation;Rolling walker (2 wheels) Toilet Transfer Details (indicate cue type and reason): RW vs no AD, for mobility to bathroom; 3:1 over toilet Toileting- Clothing Manipulation and Hygiene: Min guard;Sit to/from stand       Functional mobility during ADLs: Minimal assistance;Min guard;Rolling walker (2 wheels)  Vision   Vision Assessment?: No apparent visual deficits     Perception     Praxis      Pertinent Vitals/Pain Pain Assessment Pain Assessment: No/denies pain     Hand Dominance Right   Extremity/Trunk Assessment Upper  Extremity Assessment Upper Extremity Assessment: Generalized weakness   Lower Extremity Assessment Lower Extremity Assessment: Defer to PT evaluation   Cervical / Trunk Assessment Cervical / Trunk Assessment: Kyphotic   Communication Communication Communication: No difficulties   Cognition Arousal/Alertness: Awake/alert Behavior During Therapy: WFL for tasks assessed/performed Overall Cognitive Status: Within Functional Limits for tasks assessed                                       General Comments  VSS on 5L O2 via [place], HR max 130s    Exercises     Shoulder Instructions      Home Living Family/patient expects to be discharged to:: Private residence Living Arrangements: Other relatives Available Help at Discharge: Family;Available PRN/intermittently (granddaughter, 60 year old) Type of Home: Apartment Home Access: Level entry     Home Layout: One level     Bathroom Shower/Tub: Tub/shower unit;Walk-in shower   Bathroom Toilet: Standard     Home Equipment: Rollator (4 wheels) (O2- 5 L)          Prior Functioning/Environment Prior Level of Function : Independent/Modified Independent;Working/employed;Driving             Mobility Comments: She was independent with ambulation without device ADLs Comments: independent with ADLs, light IADLs, drives and works full time        OT Problem List: Decreased strength;Decreased activity tolerance;Impaired balance (sitting and/or standing);Cardiopulmonary status limiting activity      OT Treatment/Interventions: Self-care/ADL training;Energy conservation;DME and/or AE instruction;Therapeutic activities;Cognitive remediation/compensation;Patient/family education;Balance training    OT Goals(Current goals can be found in the care plan section) Acute Rehab OT Goals Patient Stated Goal: home OT Goal Formulation: With patient Time For Goal Achievement: 08/27/22 Potential to Achieve Goals: Good  OT  Frequency: Min 2X/week    Co-evaluation PT/OT/SLP Co-Evaluation/Treatment: Yes Reason for Co-Treatment: Complexity of the patient's impairments (multi-system involvement);For patient/therapist safety PT goals addressed during session: Mobility/safety with mobility OT goals addressed during session: ADL's and self-care      AM-PAC OT "6 Clicks" Daily Activity     Outcome Measure Help from another person eating meals?: None Help from another person taking care of personal grooming?: A Little Help from another person toileting, which includes using toliet, bedpan, or urinal?: A Little Help from another person bathing (including washing, rinsing, drying)?: A Little Help from another person to put on and taking off regular upper body clothing?: A Little Help from another person to put on and taking off regular lower body clothing?: A Little 6 Click Score: 19   End of Session Equipment Utilized During Treatment: Gait belt;Rolling walker (2 wheels);Oxygen Nurse Communication: Mobility status  Activity Tolerance: Patient tolerated treatment well Patient left: in chair;with call bell/phone within reach;with chair alarm set  OT Visit Diagnosis: Other abnormalities of gait and mobility (R26.89);Muscle weakness (generalized) (M62.81)                Time: [id number] OT Time Calculation (min): 35 min Charges:  OT General Charges $OT Visit: 1 Visit OT Evaluation $OT Eval Moderate Complexity: [place], OT Acute Rehabilitation Services Office [place]  Stephanos Fan 08/13/2022, 2:20 PM
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
## 2022-08-13 NOTE — Progress Notes (Signed)
PCCM Progress Note  RN contacted MD for concerns for persistent respiratory symptoms. Patient examined at bedside sitting in chair eating dinner in no acute distress. She does report shortness of breath with exertion. Wheezing with diminished breath sounds. Has been off steroids >24 hours so will restart this with plan for slow taper. Reminded patient that Duonebs are available q2h PRN.  Discussed plan with nurse. Patient with end stage COPD on chronic 5L at home currently with metapneumovirus. Anxiety also drives her symptoms so encouraged calm environment when able. If she develops respiratory distress, may need evaluation to consider to return to ICU however at this time remains appropriate in progressive.  For overnight questions 7pm-7am please contact Elink in [place] until she is transferred to [facility] at 7 am in the morning.

## 2022-08-14 ENCOUNTER — Telehealth (HOSPITAL_BASED_OUTPATIENT_CLINIC_OR_DEPARTMENT_OTHER): Payer: Self-pay | Admitting: Pulmonary Disease

## 2022-08-14 DIAGNOSIS — J9602 Acute respiratory failure with hypercapnia: Secondary | ICD-10-CM | POA: Diagnosis not present

## 2022-08-14 DIAGNOSIS — J441 Chronic obstructive pulmonary disease with (acute) exacerbation: Secondary | ICD-10-CM | POA: Diagnosis not present

## 2022-08-14 LAB — GLUCOSE, CAPILLARY
Glucose-Capillary: 104 mg/dL — ABNORMAL HIGH (ref 70–99)
Glucose-Capillary: 115 mg/dL — ABNORMAL HIGH (ref 70–99)
Glucose-Capillary: 329 mg/dL — ABNORMAL HIGH (ref 70–99)
Glucose-Capillary: 84 mg/dL (ref 70–99)

## 2022-08-14 MED ORDER — BUDESONIDE 0.5 MG/2ML IN SUSP: 0.5000 mg | Freq: Two times a day (BID) | RESPIRATORY_TRACT | 12 refills | Status: DC

## 2022-08-14 MED ORDER — PREDNISONE 20 MG PO TABS: 20.0000 mg | ORAL_TABLET | Freq: Every day | ORAL | Status: DC

## 2022-08-14 MED ORDER — PREDNISONE 10 MG PO TABS: 10.0000 mg | ORAL_TABLET | Freq: Every day | ORAL | Status: DC

## 2022-08-14 MED ORDER — PREDNISONE 20 MG PO TABS: ORAL_TABLET | ORAL | 0 refills | Status: AC

## 2022-08-14 MED ORDER — IPRATROPIUM-ALBUTEROL 0.5-2.5 (3) MG/3ML IN SOLN: 3.0000 mL | Freq: Four times a day (QID) | RESPIRATORY_TRACT | Status: DC

## 2022-08-14 MED ORDER — ARFORMOTEROL TARTRATE 15 MCG/2ML IN NEBU: 15.0000 ug | INHALATION_SOLUTION | Freq: Two times a day (BID) | RESPIRATORY_TRACT | 5 refills | Status: DC

## 2022-08-14 MED ORDER — PREDNISONE 20 MG PO TABS: 30.0000 mg | ORAL_TABLET | Freq: Every day | ORAL | Status: DC

## 2022-08-14 MED ORDER — PREDNISONE 20 MG PO TABS: 40.0000 mg | ORAL_TABLET | Freq: Every day | ORAL | Status: DC

## 2022-08-14 MED ORDER — PREDNISONE 50 MG PO TABS
50.0000 mg | ORAL_TABLET | Freq: Every day | ORAL | Status: DC
  Administered 2022-08-14: 50 mg via ORAL
  Filled 2022-08-14: qty 1

## 2022-08-14 NOTE — Telephone Encounter (Signed)
Please arrange follow-up with Dr. Verlee Monte or NP within 1-2 weeks for hospital follow-up. Will need to see if she needs to stay on nebulizers vs restarting Breztri.

## 2022-08-14 NOTE — Discharge Instructions (Signed)
Please pick up budesonide and Brovana nebulizer as your new medication.   At home: Use Duonebs every 6 hours Use Budesonide in the morning and evening Use Brovana in the morning and evening Take prednisone as directed  Please call the pulmonary office to schedule an appointment with Dr. Verlee Monte during office hours The cardiology office should also contact you in the future to schedule an appointment

## 2022-08-14 NOTE — Telephone Encounter (Signed)
Patient being discharged. Can you arrange Cardiology follow-up for stress CM?

## 2022-08-14 NOTE — TOC Transition Note (Signed)
Transition of Care [facility]) - CM/SW Discharge Note   Patient Details  Name: [name] MRN: [id number] Date of Birth: 10/27/1960  [facility] CM/SW Contact:  Pollie Friar, RN Phone Number: 08/14/2022, 3:45 PM   Clinical Narrative:    Pt is discharging home with outpatient therapy through [place] [facility] rehab. Referral sent and information on the AVS. Pt has oxygen at home and her father will bring her portable tank for transport home.  Pt has walker/ neb machine at home. She manages her own medications and denies any issues.  She drives self as needed.    Final next level of care: OP Rehab Barriers to Discharge: No Barriers Identified   Patient Goals and CMS Choice      Discharge Placement                         Discharge Plan and Services Additional resources added to the After Visit Summary for     Discharge Planning Services: CM Consult                                 Social Determinants of Health (SDOH) Interventions SDOH Screenings   Food Insecurity: No Food Insecurity (05/20/2022)  Housing: Low Risk  (05/20/2022)  Transportation Needs: No Transportation Needs (05/20/2022)  Utilities: Not At Risk (05/20/2022)  Financial Resource Strain: Low Risk  (04/10/2019)  Physical Activity: Inactive (04/10/2019)  Social Connections: Unknown (04/10/2019)  Stress: No Stress Concern Present (04/10/2019)  Tobacco Use: Medium Risk (08/07/2022)     Readmission Risk Interventions    05/23/2022   10:12 AM  Readmission Risk Prevention Plan  Transportation Screening Complete  PCP or Specialist Appt within 3-5 Days Complete  HRI or [place] Complete  Social Work Consult for [place] Planning/Counseling Complete  Palliative Care Screening Not Applicable  Medication Review Press photographer) Complete

## 2022-08-14 NOTE — Discharge Summary (Signed)
Physician Discharge Summary  Patient ID: [name] MRN: [id number] DOB/AGE: September 08, 1960 62 y.o.  Admit date: 08/07/2022 Discharge date: 08/14/2022  Admission Diagnoses: COPD exacerbation  Discharge Diagnoses:  Active Problems:   COPD exacerbation (HCC)   AKI (acute kidney injury) ([place])   Hyperglycemia   Acute respiratory failure with hypercapnia ([place])   Discharged Condition: fair  Hospital Course:  62 year old female with pertinent PMH COPD on 5 LNC at home, HTN, HLD, CAD presents to [facility] ED on 3/24 respiratory distress. On 3/12 seen by Dr. Verlee Monte outpt for worsening dyspnea. Started on prednisone taper, azithromycin, and breztri. Prn albuterol. On 3/24 patient was having progressively worsening respiratory distress.  Called EMS.  Upon arrival patient gasping for air and eventually became unresponsive.  Patient being bagged and given breathing treatments, mag, steroids and transported to [facility] ED.   Upon arrival to [facility] ED, patient unresponsive being bagged.  Bilateral BS severely diminished diminished with expiratory wheezing appreciated.  Patient intubated and placed on vent.  ABG obtained showing severe respiratory acidosis.  EKG showing sinus tachy.   BP initially stable but after being on sedation has become soft.  CXR showing emphysematous changes; no focal consolidation. PCCM consulted for icu for intubation. Extubated on 08/12/22 to home 5L O2.  Echo noted stress cardiomyopathy and patient seen by cardiology. Will arrange follow-up with pulmonary and cardiology.  Consults: cardiology  Treatments: Nebulizers, steroids, mechanical ventilation  Discharge Exam: Blood pressure 104/70, pulse 85, temperature (!) 97.4 F (36.3 C), temperature source Axillary, resp. rate 16, height 5\' 3"  (1.6 m), weight 70.4 kg, SpO2 98 %.  Physical Exam: General: Chronically ill-appearing, no acute distress HENT: [place], AT Eyes: EOMI, no scleral icterus Respiratory: Diminished but clear to auscultation  bilaterally.  No crackles, wheezing or rales Cardiovascular: RRR, -M/R/G, no JVD Extremities:-Edema,-tenderness Neuro: AAO x4, CNII-XII grossly intact Psych: Normal mood, normal affect   Disposition:    Allergies as of 08/14/2022       Reactions   Amoxicillin Shortness Of Breath   Tylenol [acetaminophen] Shortness Of Breath   Statins    Zetia [ezetimibe]         Medication List     STOP taking these medications    azithromycin 250 MG tablet Commonly known as: ZITHROMAX   Breztri Aerosphere 160-9-4.8 MCG/ACT Aero Generic drug: Budeson-Glycopyrrol-Formoterol   cefpodoxime 200 MG tablet Commonly known as: VANTIN   doxycycline 100 MG capsule Commonly known as: VIBRAMYCIN   nitroGLYCERIN 0.4 MG SL tablet Commonly known as: NITROSTAT   Spacer/Aero-Holding [employer]       TAKE these medications    albuterol 108 (90 Base) MCG/ACT inhaler Commonly known as: VENTOLIN HFA Inhale 2 puffs into the lungs every 4 (four) hours as needed for wheezing or shortness of breath.   albuterol 1.25 MG/3ML nebulizer solution Commonly known as: ACCUNEB Take 1 ampule by nebulization every 4 (four) hours.   arformoterol 15 MCG/2ML Nebu Commonly known as: BROVANA Take 2 mLs (15 mcg total) by nebulization 2 (two) times daily.   budesonide 0.5 MG/2ML nebulizer solution Commonly known as: PULMICORT Take 2 mLs (0.5 mg total) by nebulization 2 (two) times daily.   famotidine 20 MG tablet Commonly known as: PEPCID TAKE 1 TABLET BY MOUTH EVERY DAY   guaiFENesin-dextromethorphan 100-10 MG/5ML syrup Commonly known as: ROBITUSSIN DM Take 10 mLs by mouth every 4 (four) hours as needed for cough.   hydrOXYzine 10 MG tablet Commonly known as: ATARAX Take 1 tablet (10 mg total) by mouth  3 (three) times daily as needed for anxiety.   ipratropium-albuterol 0.5-2.5 (3) MG/3ML Soln Commonly known as: DUONEB Take 3 mLs by nebulization in the morning, at noon, in the evening, and at  bedtime.   montelukast 10 MG tablet Commonly known as: SINGULAIR TAKE 1 TABLET BY MOUTH EVERYDAY AT BEDTIME   OXYGEN Inhale 4 L into the lungs at bedtime.   predniSONE 20 MG tablet Commonly known as: DELTASONE Take 60 mg by mouth daily. What changed: Another medication with the same name was changed. Make sure you understand how and when to take each.   predniSONE 20 MG tablet Commonly known as: DELTASONE Take 2 tablets (40 mg total) by mouth daily with breakfast for 7 days, THEN 1 tablet (20 mg total) daily with breakfast for 7 days. Start taking on: August 14, 2022 What changed:  medication strength See the new instructions.   Repatha SureClick [id number] MG/ML Soaj Generic drug: Evolocumab Inject 140 mg into the skin every 14 (fourteen) days.         Signed: Margaretha Seeds 08/14/2022, 11:12 AM
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
## 2022-08-15 ENCOUNTER — Other Ambulatory Visit: Payer: Self-pay | Admitting: Pulmonary Disease

## 2022-08-15 ENCOUNTER — Other Ambulatory Visit: Payer: Self-pay | Admitting: Cardiology

## 2022-08-15 MED ORDER — ALBUTEROL SULFATE (2.5 MG/3ML) 0.083% IN NEBU: 2.5000 mg | INHALATION_SOLUTION | RESPIRATORY_TRACT | 12 refills | Status: DC | PRN

## 2022-08-15 NOTE — Telephone Encounter (Signed)
Refill # 15 only, must keep scheduled appointment

## 2022-08-15 NOTE — Telephone Encounter (Signed)
ATC LVMTCB x 1. If pt returns call please schedule for 1 - 2 hospital follow up with Dr. Verlee Monte or NP.

## 2022-08-15 NOTE — Telephone Encounter (Signed)
Pt called the office back. Pt has an appt schedule with Dr. Verlee Monte after a CT scan. Nothing further needed.

## 2022-08-15 NOTE — Telephone Encounter (Signed)
Denied refill, patient has f/u appointment 08/19/22

## 2022-08-15 NOTE — Telephone Encounter (Signed)
See telephone encounter from 08/14/22.

## 2022-08-19 ENCOUNTER — Other Ambulatory Visit (HOSPITAL_COMMUNITY): Payer: Self-pay

## 2022-08-19 ENCOUNTER — Ambulatory Visit: Payer: BC Managed Care – PPO | Admitting: Cardiology

## 2022-08-24 ENCOUNTER — Other Ambulatory Visit (HOSPITAL_COMMUNITY): Payer: Self-pay

## 2022-08-24 DIAGNOSIS — H9190 Unspecified hearing loss, unspecified ear: Secondary | ICD-10-CM

## 2022-08-24 DIAGNOSIS — IMO0001 Reserved for inherently not codable concepts without codable children: Secondary | ICD-10-CM

## 2022-08-24 HISTORY — DX: Reserved for inherently not codable concepts without codable children: IMO0001

## 2022-08-25 ENCOUNTER — Telehealth: Payer: Self-pay

## 2022-08-25 ENCOUNTER — Ambulatory Visit: Payer: BC Managed Care – PPO | Attending: Pulmonary Disease

## 2022-08-25 DIAGNOSIS — R5381 Other malaise: Secondary | ICD-10-CM | POA: Insufficient documentation

## 2022-08-25 DIAGNOSIS — J9601 Acute respiratory failure with hypoxia: Secondary | ICD-10-CM | POA: Diagnosis not present

## 2022-08-25 DIAGNOSIS — J9602 Acute respiratory failure with hypercapnia: Secondary | ICD-10-CM | POA: Diagnosis not present

## 2022-08-25 DIAGNOSIS — M6281 Muscle weakness (generalized): Secondary | ICD-10-CM | POA: Diagnosis present

## 2022-08-25 DIAGNOSIS — R2689 Other abnormalities of gait and mobility: Secondary | ICD-10-CM | POA: Diagnosis present

## 2022-08-25 NOTE — Telephone Encounter (Signed)
Prior authorization requested from [facility] for Repatha 140 mg/ml Sureclick
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
## 2022-08-26 ENCOUNTER — Encounter (HOSPITAL_COMMUNITY): Payer: Self-pay

## 2022-08-26 ENCOUNTER — Ambulatory Visit (HOSPITAL_COMMUNITY)
Admission: RE | Admit: 2022-08-26 | Discharge: 2022-08-26 | Disposition: A | Discharge status: Home / Self Care | Payer: BC Managed Care – PPO | Source: Ambulatory Visit | Attending: Internal Medicine | Admitting: Internal Medicine

## 2022-08-26 DIAGNOSIS — R911 Solitary pulmonary nodule: Secondary | ICD-10-CM | POA: Insufficient documentation

## 2022-08-26 MED ORDER — PRALUENT 150 MG/ML ~~LOC~~ SOAJ: 1.0000 | SUBCUTANEOUS | 11 refills | Status: DC

## 2022-08-26 NOTE — Telephone Encounter (Signed)
Pt's plan prefers Praluent this year, PA previously approved and rx sent in. Not sure why this med was removed from med list and Repatha was added back. This was done on 1/31 but I do not see any documentation of why. Called pt, she is aware to take Praluent, med list has been corrected again.

## 2022-08-26 NOTE — Telephone Encounter (Signed)
Please see 06/08/22 encounter

## 2022-08-26 NOTE — Addendum Note (Signed)
Addended by: Maridee Slape E on: 08/26/2022 12:15 PM   Modules accepted: Orders

## 2022-08-26 NOTE — Telephone Encounter (Signed)
Is patient on Repatha or Praluent?
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
## 2022-08-31 ENCOUNTER — Other Ambulatory Visit (HOSPITAL_COMMUNITY): Payer: Self-pay

## 2022-08-31 ENCOUNTER — Ambulatory Visit (INDEPENDENT_AMBULATORY_CARE_PROVIDER_SITE_OTHER): Payer: BC Managed Care – PPO | Admitting: Student

## 2022-08-31 ENCOUNTER — Encounter: Payer: Self-pay | Admitting: Student

## 2022-08-31 VITALS — BP 124/76 | HR 89 | Temp 98.2°F | Ht 63.0 in | Wt 146.4 lb

## 2022-08-31 DIAGNOSIS — J449 Chronic obstructive pulmonary disease, unspecified: Secondary | ICD-10-CM

## 2022-08-31 DIAGNOSIS — J9611 Chronic respiratory failure with hypoxia: Secondary | ICD-10-CM

## 2022-08-31 MED ORDER — ALBUTEROL SULFATE (2.5 MG/3ML) 0.083% IN NEBU: 2.5000 mg | INHALATION_SOLUTION | RESPIRATORY_TRACT | 12 refills | Status: DC | PRN

## 2022-08-31 MED ORDER — METHYLPREDNISOLONE ACETATE 80 MG/ML IJ SUSP
80.0000 mg | Freq: Once | INTRAMUSCULAR | Status: AC
  Administered 2022-08-31: 80 mg via INTRAMUSCULAR

## 2022-08-31 MED ORDER — BREZTRI AEROSPHERE 160-9-4.8 MCG/ACT IN AERO: 2.0000 | INHALATION_SPRAY | Freq: Two times a day (BID) | RESPIRATORY_TRACT | 11 refills | Status: DC

## 2022-08-31 MED ORDER — BREZTRI AEROSPHERE 160-9-4.8 MCG/ACT IN AERO: 2.0000 | INHALATION_SPRAY | Freq: Two times a day (BID) | RESPIRATORY_TRACT | 0 refills | Status: DC

## 2022-08-31 NOTE — Patient Instructions (Addendum)
-   START breztri 2 puff twice daily, RINSE mouth after each use - AFTER each breztri treatment use flutter valve 10 slow but firm puffs twice daily to get mucus out of your lungs - Albuterol nebulizer and inhaler as needed - THIS IS YOUR RESCUE medication only - STOP duoneb (albuterol- ipratropium combination neb) once you start breztri - mucinex 600 mg to 1200 mg twice daily to thin your mucus - pulm rehab referral placed after you're done with PT - see you in 8 weeks or sooner if need be

## 2022-09-01 ENCOUNTER — Encounter: Payer: Self-pay | Admitting: Cardiology

## 2022-09-01 ENCOUNTER — Ambulatory Visit: Payer: BC Managed Care – PPO | Attending: Cardiology | Admitting: Cardiology

## 2022-09-01 VITALS — BP 100/66 | HR 90 | Ht 63.0 in | Wt 146.0 lb

## 2022-09-01 DIAGNOSIS — R0609 Other forms of dyspnea: Secondary | ICD-10-CM | POA: Diagnosis not present

## 2022-09-01 DIAGNOSIS — I429 Cardiomyopathy, unspecified: Secondary | ICD-10-CM

## 2022-09-01 DIAGNOSIS — I2510 Atherosclerotic heart disease of native coronary artery without angina pectoris: Secondary | ICD-10-CM

## 2022-09-01 DIAGNOSIS — I700 Atherosclerosis of aorta: Secondary | ICD-10-CM | POA: Diagnosis not present

## 2022-09-01 DIAGNOSIS — I2584 Coronary atherosclerosis due to calcified coronary lesion: Secondary | ICD-10-CM

## 2022-09-01 HISTORY — DX: Cardiomyopathy, unspecified: I42.9

## 2022-09-01 HISTORY — DX: Atherosclerotic heart disease of native coronary artery without angina pectoris: I25.10

## 2022-09-01 MED ORDER — ASPIRIN 81 MG PO TBEC: 81.0000 mg | DELAYED_RELEASE_TABLET | Freq: Every day | ORAL | 3 refills | Status: DC

## 2022-09-01 NOTE — Patient Instructions (Addendum)
Medication Instructions:  Your physician has recommended you make the following change in your medication:   Start taking 81 mg coated aspirin daily.  *If you need a refill on your cardiac medications before your next appointment, please call your pharmacy*   Lab Work: Your physician recommends that you return for lab work in: the next few days for CMP and lipids. You need to have labs done when you are fasting. [facility] lab is located on the 3rd floor, [address]. Hours are Monday - Friday 8 am to 4 pm, closed 11:30 am to 1:00 pm. You do NOT need an appointment.   If you have labs (blood work) drawn today and your tests are completely normal, you will receive your results only by: MyChart Message (if you have MyChart) OR A paper copy in the mail If you have any lab test that is abnormal or we need to change your treatment, we will call you to review the results.   Testing/Procedures: None ordered   Follow-Up: At [facility], you and your health needs are our priority.  As part of our continuing mission to provide you with exceptional heart care, we have created designated Provider Care Teams.  These Care Teams include your primary Cardiologist (physician) and Advanced Practice Providers (APPs -  Physician Assistants and Nurse Practitioners) who all work together to provide you with the care you need, when you need it.  We recommend signing up for the patient portal called "MyChart".  Sign up information is provided on this After Visit Summary.  MyChart is used to connect with patients for Virtual Visits (Telemedicine).  Patients are able to view lab/test results, encounter notes, upcoming appointments, etc.  Non-urgent messages can be sent to your provider as well.   To learn more about what you can do with MyChart, go to [email or web address].    Your next appointment:   3 month(s)  The format for your next appointment:   In Person  Provider:   Belva Crome, MD     Other Instructions none  Important Information About Sugar
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
## 2022-09-01 NOTE — Telephone Encounter (Signed)
Plan to continue maintenance inhalers for now based on OV note from 08/31/22. Closing this encounter. Please let pharmacy team know if further assistance needed on spec med.  Chesley Mires, PharmD, MPH, BCPS, CPP Clinical Pharmacist (Rheumatology and Pulmonology)

## 2022-09-01 NOTE — Addendum Note (Signed)
Addended by: Eleonore Chiquito on: 09/01/2022 01:31 PM   Modules accepted: Orders

## 2022-09-02 ENCOUNTER — Ambulatory Visit: Payer: BC Managed Care – PPO | Admitting: Physical Therapy

## 2022-09-02 ENCOUNTER — Encounter: Payer: Self-pay | Admitting: Physical Therapy

## 2022-09-02 DIAGNOSIS — R5381 Other malaise: Secondary | ICD-10-CM | POA: Diagnosis not present

## 2022-09-02 DIAGNOSIS — M6281 Muscle weakness (generalized): Secondary | ICD-10-CM
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
## 2022-09-06 ENCOUNTER — Encounter: Payer: Self-pay | Admitting: Physical Therapy

## 2022-09-06 ENCOUNTER — Ambulatory Visit: Payer: BC Managed Care – PPO | Admitting: Physical Therapy

## 2022-09-06 DIAGNOSIS — R5381 Other malaise: Secondary | ICD-10-CM | POA: Diagnosis not present

## 2022-09-06 DIAGNOSIS — M6281 Muscle weakness (generalized): Secondary | ICD-10-CM
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
## 2022-09-08 ENCOUNTER — Ambulatory Visit: Payer: BC Managed Care – PPO | Admitting: Cardiology

## 2022-09-09 ENCOUNTER — Emergency Department (HOSPITAL_COMMUNITY): Payer: BC Managed Care – PPO

## 2022-09-09 ENCOUNTER — Encounter (HOSPITAL_COMMUNITY): Payer: Self-pay

## 2022-09-09 ENCOUNTER — Other Ambulatory Visit: Payer: Self-pay

## 2022-09-09 ENCOUNTER — Ambulatory Visit: Payer: BC Managed Care – PPO | Admitting: Physical Therapy

## 2022-09-09 ENCOUNTER — Observation Stay (HOSPITAL_COMMUNITY)
Admission: EM | Admit: 2022-09-09 | Discharge: 2022-09-10 | Disposition: A | Discharge status: Home / Self Care | Payer: BC Managed Care – PPO | Attending: Internal Medicine | Admitting: Internal Medicine

## 2022-09-09 DIAGNOSIS — E785 Hyperlipidemia, unspecified: Secondary | ICD-10-CM | POA: Diagnosis not present

## 2022-09-09 DIAGNOSIS — I10 Essential (primary) hypertension: Secondary | ICD-10-CM | POA: Diagnosis present

## 2022-09-09 DIAGNOSIS — J189 Pneumonia, unspecified organism: Secondary | ICD-10-CM | POA: Diagnosis not present

## 2022-09-09 DIAGNOSIS — I429 Cardiomyopathy, unspecified: Secondary | ICD-10-CM

## 2022-09-09 DIAGNOSIS — Z87891 Personal history of nicotine dependence: Secondary | ICD-10-CM | POA: Insufficient documentation

## 2022-09-09 DIAGNOSIS — E876 Hypokalemia: Secondary | ICD-10-CM | POA: Diagnosis not present

## 2022-09-09 DIAGNOSIS — Z7982 Long term (current) use of aspirin: Secondary | ICD-10-CM | POA: Diagnosis not present

## 2022-09-09 DIAGNOSIS — Z79899 Other long term (current) drug therapy: Secondary | ICD-10-CM | POA: Insufficient documentation

## 2022-09-09 DIAGNOSIS — Z8616 Personal history of COVID-19: Secondary | ICD-10-CM | POA: Diagnosis not present

## 2022-09-09 DIAGNOSIS — J439 Emphysema, unspecified: Secondary | ICD-10-CM | POA: Diagnosis not present

## 2022-09-09 DIAGNOSIS — K219 Gastro-esophageal reflux disease without esophagitis: Secondary | ICD-10-CM | POA: Diagnosis present

## 2022-09-09 DIAGNOSIS — J441 Chronic obstructive pulmonary disease with (acute) exacerbation: Secondary | ICD-10-CM | POA: Diagnosis present

## 2022-09-09 DIAGNOSIS — R0602 Shortness of breath: Secondary | ICD-10-CM | POA: Diagnosis present

## 2022-09-09 DIAGNOSIS — J9611 Chronic respiratory failure with hypoxia: Secondary | ICD-10-CM | POA: Diagnosis present

## 2022-09-09 DIAGNOSIS — I2510 Atherosclerotic heart disease of native coronary artery without angina pectoris: Secondary | ICD-10-CM | POA: Diagnosis not present

## 2022-09-09 LAB — BLOOD GAS, VENOUS
Acid-Base Excess: 5.5 mmol/L — ABNORMAL HIGH (ref 0.0–2.0)
Bicarbonate: 32.4 mmol/L — ABNORMAL HIGH (ref 20.0–28.0)
O2 Saturation: 92.9 %
Patient temperature: 37
pCO2, Ven: 56 mmHg (ref 44–60)
pH, Ven: 7.37 (ref 7.25–7.43)
pO2, Ven: 62 mmHg — ABNORMAL HIGH (ref 32–45)

## 2022-09-09 LAB — BRAIN NATRIURETIC PEPTIDE: B Natriuretic Peptide: 14.8 pg/mL (ref 0.0–100.0)

## 2022-09-09 LAB — CBC WITH DIFFERENTIAL/PLATELET
Abs Immature Granulocytes: 0.01 10*3/uL (ref 0.00–0.07)
Basophils Absolute: 0 10*3/uL (ref 0.0–0.1)
Basophils Relative: 1 %
Eosinophils Absolute: 0.1 10*3/uL (ref 0.0–0.5)
Eosinophils Relative: 1 %
HCT: 40.4 % (ref 36.0–46.0)
Hemoglobin: 12.5 g/dL (ref 12.0–15.0)
Immature Granulocytes: 0 %
Lymphocytes Relative: 31 %
Lymphs Abs: 1.9 10*3/uL (ref 0.7–4.0)
MCH: 28.5 pg (ref 26.0–34.0)
MCHC: 30.9 g/dL (ref 30.0–36.0)
MCV: 92.2 fL (ref 80.0–100.0)
Monocytes Absolute: 0.5 10*3/uL (ref 0.1–1.0)
Monocytes Relative: 9 %
Neutro Abs: 3.6 10*3/uL (ref 1.7–7.7)
Neutrophils Relative %: 58 %
Platelets: 327 10*3/uL (ref 150–400)
RBC: 4.38 MIL/uL (ref 3.87–5.11)
RDW: 15 % (ref 11.5–15.5)
WBC: 6.1 10*3/uL (ref 4.0–10.5)
nRBC: 0 % (ref 0.0–0.2)

## 2022-09-09 LAB — BASIC METABOLIC PANEL
Anion gap: 11 (ref 5–15)
BUN: 7 mg/dL — ABNORMAL LOW (ref 8–23)
CO2: 26 mmol/L (ref 22–32)
Calcium: 8.9 mg/dL (ref 8.9–10.3)
Chloride: 101 mmol/L (ref 98–111)
Creatinine, Ser: 0.51 mg/dL (ref 0.44–1.00)
GFR, Estimated: >60 mL/min (ref >60)
Glucose, Bld: 99 mg/dL (ref 70–99)
Potassium: 3.4 mmol/L — ABNORMAL LOW (ref 3.5–5.1)
Sodium: 138 mmol/L (ref 135–145)

## 2022-09-09 LAB — TROPONIN I (HIGH SENSITIVITY): Troponin I (High Sensitivity): 3 ng/L (ref <18)

## 2022-09-09 MED ORDER — FAMOTIDINE 20 MG PO TABS
20.0000 mg | ORAL_TABLET | Freq: Every day | ORAL | Status: DC
  Administered 2022-09-09: 20 mg via ORAL
  Filled 2022-09-09: qty 1

## 2022-09-09 MED ORDER — METHYLPREDNISOLONE SODIUM SUCC 125 MG IJ SOLR
125.0000 mg | Freq: Once | INTRAMUSCULAR | Status: AC
  Administered 2022-09-09: 125 mg via INTRAVENOUS
  Filled 2022-09-09: qty 2

## 2022-09-09 MED ORDER — METOPROLOL TARTRATE 5 MG/5ML IV SOLN: 5.0000 mg | Freq: Four times a day (QID) | INTRAVENOUS | Status: DC | PRN

## 2022-09-09 MED ORDER — IPRATROPIUM-ALBUTEROL 0.5-2.5 (3) MG/3ML IN SOLN
3.0000 mL | Freq: Once | RESPIRATORY_TRACT | Status: AC
  Administered 2022-09-09: 3 mL via RESPIRATORY_TRACT
  Filled 2022-09-09: qty 3

## 2022-09-09 MED ORDER — ALBUTEROL SULFATE (2.5 MG/3ML) 0.083% IN NEBU
10.0000 mg | INHALATION_SOLUTION | Freq: Once | RESPIRATORY_TRACT | Status: AC
  Administered 2022-09-09: 10 mg via RESPIRATORY_TRACT
  Filled 2022-09-09: qty 12

## 2022-09-09 MED ORDER — SENNOSIDES-DOCUSATE SODIUM 8.6-50 MG PO TABS: 1.0000 | ORAL_TABLET | Freq: Every evening | ORAL | Status: DC | PRN

## 2022-09-09 MED ORDER — SODIUM CHLORIDE (PF) 0.9 % IJ SOLN
INTRAMUSCULAR | Status: AC
  Filled 2022-09-09: qty 50

## 2022-09-09 MED ORDER — ALBUTEROL SULFATE (2.5 MG/3ML) 0.083% IN NEBU: 2.5000 mg | INHALATION_SOLUTION | RESPIRATORY_TRACT | Status: DC | PRN

## 2022-09-09 MED ORDER — IPRATROPIUM BROMIDE 0.02 % IN SOLN
0.5000 mg | Freq: Once | RESPIRATORY_TRACT | Status: AC
  Administered 2022-09-09: 0.5 mg via RESPIRATORY_TRACT
  Filled 2022-09-09: qty 2.5

## 2022-09-09 MED ORDER — LEVOFLOXACIN IN D5W 750 MG/150ML IV SOLN
750.0000 mg | INTRAVENOUS | Status: DC
  Administered 2022-09-09: 750 mg via INTRAVENOUS
  Filled 2022-09-09 (×2): qty 150

## 2022-09-09 MED ORDER — TRAMADOL HCL 50 MG PO TABS: 50.0000 mg | ORAL_TABLET | Freq: Four times a day (QID) | ORAL | Status: DC | PRN

## 2022-09-09 MED ORDER — ENOXAPARIN SODIUM 40 MG/0.4ML IJ SOSY
40.0000 mg | PREFILLED_SYRINGE | INTRAMUSCULAR | Status: DC
  Administered 2022-09-09: 40 mg via SUBCUTANEOUS
  Filled 2022-09-09: qty 0.4

## 2022-09-09 MED ORDER — METHYLPREDNISOLONE SODIUM SUCC 40 MG IJ SOLR
40.0000 mg | Freq: Two times a day (BID) | INTRAMUSCULAR | Status: DC
  Administered 2022-09-09 – 2022-09-10 (×2): 40 mg via INTRAVENOUS
  Filled 2022-09-09 (×2): qty 1

## 2022-09-09 MED ORDER — TRAZODONE HCL 50 MG PO TABS: 25.0000 mg | ORAL_TABLET | Freq: Every evening | ORAL | Status: DC | PRN

## 2022-09-09 MED ORDER — IOHEXOL 350 MG/ML SOLN
75.0000 mL | Freq: Once | INTRAVENOUS | Status: AC | PRN
  Administered 2022-09-09: 75 mL via INTRAVENOUS

## 2022-09-09 MED ORDER — IPRATROPIUM-ALBUTEROL 0.5-2.5 (3) MG/3ML IN SOLN
3.0000 mL | Freq: Four times a day (QID) | RESPIRATORY_TRACT | Status: DC
  Administered 2022-09-09 – 2022-09-10 (×3): 3 mL via RESPIRATORY_TRACT
  Filled 2022-09-09 (×3): qty 3

## 2022-09-09 MED ORDER — IBUPROFEN 200 MG PO TABS
400.0000 mg | ORAL_TABLET | Freq: Four times a day (QID) | ORAL | Status: DC | PRN
  Administered 2022-09-09: 400 mg via ORAL
  Filled 2022-09-09: qty 2

## 2022-09-09 MED ORDER — ONDANSETRON HCL 4 MG/2ML IJ SOLN: 4.0000 mg | Freq: Four times a day (QID) | INTRAMUSCULAR | Status: DC | PRN

## 2022-09-09 MED ORDER — ONDANSETRON HCL 4 MG PO TABS: 4.0000 mg | ORAL_TABLET | Freq: Four times a day (QID) | ORAL | Status: DC | PRN

## 2022-09-09 MED ORDER — ORAL CARE MOUTH RINSE: 15.0000 mL | OROMUCOSAL | Status: DC | PRN

## 2022-09-09 MED ORDER — MAGNESIUM SULFATE 2 GM/50ML IV SOLN
2.0000 g | Freq: Once | INTRAVENOUS | Status: AC
  Administered 2022-09-09: 2 g via INTRAVENOUS
  Filled 2022-09-09: qty 50

## 2022-09-09 NOTE — Progress Notes (Signed)
Removed PT from BiPAP and placed on 5 LPM [place] (5 LPM 02 Dep). PT can speak in complete sentences and is aware of time, self and place. MD aware.

## 2022-09-09 NOTE — ED Provider Notes (Signed)
[place] EMERGENCY DEPARTMENT AT [facility] Provider Note   CSN: [id number] Arrival date & time: 09/09/22  4098     History  Chief Complaint  Patient presents with   Shortness of Breath    [name] is a 62 y.o. female.  62 yo F with a chief complaint of difficulty breathing.  Said it started last night.  Feels like her lung disease.  No known sick contacts no fevers no chest pain.  Tried her home medication without improvement.   Shortness of Breath      Home Medications Prior to Admission medications   Medication Sig Start Date End Date Taking? Authorizing Provider  albuterol (PROVENTIL) (2.5 MG/3ML) 0.083% nebulizer solution Take 3 mLs (2.5 mg total) by nebulization every 4 (four) hours as needed for wheezing or shortness of breath. 08/31/22   Omar Person, MD  albuterol (VENTOLIN HFA) 108 (90 Base) MCG/ACT inhaler Inhale 2 puffs into the lungs every 4 (four) hours as needed for wheezing or shortness of breath. 04/25/22   Lewie Chamber, MD  aspirin EC 81 MG tablet Take 1 tablet (81 mg total) by mouth daily. Swallow whole. 09/01/22   Revankar, Aundra Dubin, MD  Budeson-Glycopyrrol-Formoterol (BREZTRI AEROSPHERE) 160-9-4.8 MCG/ACT AERO Inhale 2 puffs into the lungs in the morning and at bedtime. 08/31/22   Omar Person, MD  Evolocumab (REPATHA SURECLICK) 140 MG/ML SOAJ Inject 140 mg into the skin every 14 (fourteen) days.    [provider]  famotidine (PEPCID) 20 MG tablet Take 1 tablet (20 mg total) by mouth daily. Must keep scheduled appointment for future refills Patient not taking: Reported on 09/01/2022 08/15/22   Baldo Daub, MD  guaiFENesin-dextromethorphan (ROBITUSSIN DM) 100-10 MG/5ML syrup Take 10 mLs by mouth every 4 (four) hours as needed for cough. Patient not taking: Reported on 09/01/2022 05/08/22   Kathlen Mody, MD  hydrOXYzine (ATARAX) 10 MG tablet Take 1 tablet (10 mg total) by mouth 3 (three) times daily as needed for  anxiety. Patient not taking: Reported on 09/01/2022 05/08/22   Kathlen Mody, MD  montelukast (SINGULAIR) 10 MG tablet TAKE 1 TABLET BY MOUTH EVERYDAY AT BEDTIME Patient not taking: Reported on 09/01/2022 08/16/22   Chilton Greathouse, MD  OXYGEN Inhale 5 L into the lungs at bedtime.    [provider]      Allergies    Amoxicillin, Tylenol [acetaminophen], Statins, and Zetia [ezetimibe]    Review of Systems   Review of Systems  Respiratory:  Positive for shortness of breath.     Physical Exam Updated Vital Signs BP (!) 146/116 (BP Location: Right Arm)   Pulse 94   Temp (!) 97.3 F (36.3 C) (Axillary)   Resp (!) 31   Ht 5\' 3"  (1.6 m)   Wt 66.2 kg   SpO2 100%   BMI 25.86 kg/m  Physical Exam Vitals and nursing note reviewed.  Constitutional:      General: She is not in acute distress.    Appearance: She is well-developed. She is not diaphoretic.  HENT:     Head: Normocephalic and atraumatic.  Eyes:     Pupils: Pupils are equal, round, and reactive to light.  Cardiovascular:     Rate and Rhythm: Normal rate and regular rhythm.     Heart sounds: No murmur heard.    No friction rub. No gallop.  Pulmonary:     Effort: Accessory muscle usage and respiratory distress present.     Breath sounds: Wheezing  present. No rales.     Comments: Diminished breath sounds in all fields with prolonged expiratory effort and wheezes.  Tachypnea. Abdominal:     General: There is no distension.     Palpations: Abdomen is soft.     Tenderness: There is no abdominal tenderness.  Musculoskeletal:        General: No tenderness.     Cervical back: Normal range of motion and neck supple.  Skin:    General: Skin is warm and dry.  Neurological:     Mental Status: She is alert and oriented to person, place, and time.  Psychiatric:        Behavior: Behavior normal.     ED Results / Procedures / Treatments   Labs (all labs ordered are listed, but only abnormal results are displayed) Labs  Reviewed  BLOOD GAS, VENOUS - Abnormal; Notable for the following components:      Result Value   pO2, Ven 62 (*)    Bicarbonate 32.4 (*)    Acid-Base Excess 5.5 (*)    All other components within normal limits  CBC WITH DIFFERENTIAL/PLATELET  BRAIN NATRIURETIC PEPTIDE  BASIC METABOLIC PANEL  TROPONIN I (HIGH SENSITIVITY)    EKG None  Radiology DG Chest Port 1 View  Result Date: 09/09/2022 CLINICAL DATA:  Shortness of breath. EXAM: PORTABLE CHEST 1 VIEW COMPARISON:  08/12/2022 FINDINGS: Lungs are hyperexpanded. Cardiopericardial silhouette is at upper limits of normal for size. Streaky opacity at the lung bases is similar to prior compatible with atelectasis or scarring. No focal consolidative opacity or evidence of pulmonary edema. Telemetry leads overlie the chest. IMPRESSION: Hyperexpansion with bibasilar atelectasis or scarring. No acute cardiopulmonary findings. Electronically Signed   By: Kennith Center M.D.   On: 09/09/2022 07:01    Procedures .Critical Care  Performed by: Melene Plan, DO Authorized by: Melene Plan, DO   Critical care provider statement:    Critical care time (minutes):  35   Critical care time was exclusive of:  Separately billable procedures and treating other patients   Critical care was time spent personally by me on the following activities:  Development of treatment plan with patient or surrogate, discussions with consultants, evaluation of patient's response to treatment, examination of patient, ordering and review of laboratory studies, ordering and review of radiographic studies, ordering and performing treatments and interventions, pulse oximetry, re-evaluation of patient's condition and review of old charts   Care discussed with: admitting provider       Medications Ordered in ED Medications  methylPREDNISolone sodium succinate (SOLU-MEDROL) 125 mg/2 mL injection 125 mg (has no administration in time range)  magnesium sulfate IVPB 2 g 50 mL (has no  administration in time range)  albuterol (PROVENTIL) (2.5 MG/3ML) 0.083% nebulizer solution 10 mg (10 mg Nebulization Given 09/09/22 0649)  ipratropium (ATROVENT) nebulizer solution 0.5 mg (0.5 mg Nebulization Given 09/09/22 9528)    ED Course/ Medical Decision Making/ A&P                             Medical Decision Making Amount and/or Complexity of Data Reviewed Labs: ordered. Radiology: ordered.  Risk Prescription drug management.   62 yo F with a chief complaint of difficulty breathing.  Going on since last night.  Patient in respiratory distress on arrival.  Oxygen saturation in the 60s.  Will place on BiPAP.  Continuous albuterol treatment.  Signed out to Dr. Broadus John, please see their note  for further details of care in the ED.  The patients results and plan were reviewed and discussed.   Any x-rays performed were independently reviewed by myself.   Differential diagnosis were considered with the presenting HPI.  Medications  methylPREDNISolone sodium succinate (SOLU-MEDROL) 125 mg/2 mL injection 125 mg (has no administration in time range)  magnesium sulfate IVPB 2 g 50 mL (has no administration in time range)  albuterol (PROVENTIL) (2.5 MG/3ML) 0.083% nebulizer solution 10 mg (10 mg Nebulization Given 09/09/22 0649)  ipratropium (ATROVENT) nebulizer solution 0.5 mg (0.5 mg Nebulization Given 09/09/22 0649)    Vitals:   09/09/22 0640 09/09/22 0645 09/09/22 0646 09/09/22 0650  BP:      Pulse: 94 88 94   Resp: (!) 26 (!) 21 (!) 31   Temp:      TempSrc:      SpO2: 100% 100% 100% 100%  Weight:      Height:        Final diagnoses:  COPD exacerbation (HCC)           Final Clinical Impression(s) / ED Diagnoses Final diagnoses:  COPD exacerbation [facility])    Rx / DC Orders ED Discharge Orders     None         Melene Plan, DO 09/09/22 1610

## 2022-09-09 NOTE — ED Provider Notes (Addendum)
COPD on Bipap. Baseline 5L home O2 Physical Exam  BP (!) 146/116 (BP Location: Right Arm)   Pulse 94   Temp (!) 97.3 F (36.3 C) (Axillary)   Resp (!) 31   Ht 5\' 3"  (1.6 m)   Wt 66.2 kg   SpO2 100%   BMI 25.86 kg/m   Physical Exam  Procedures  Procedures  ED Course / MDM    Medical Decision Making Amount and/or Complexity of Data Reviewed Labs: ordered. Radiology: ordered.  Risk Prescription drug management.   07: 30.  Patient evaluated.  She is tolerating BiPAP well.  She is alert in no acute distress.  She is able to speak clearly BiPAP.  She reports symptoms are improved compared to earlier today.  Patient reports she is continuing to have right lateral thoracic pain.  She reports this has been going on for several days.  Is been constant.  Sharp achy pain right lateral thoracic rib cage.  On exam she does continue to have expiratory wheeze bilateral lung fields.  There are some crackle in the right lower to mid lung field.  CT PE study negative for PE.  Persisting right-sided infiltrate or consolidation.  12: 42 patient has been off BiPAP since earlier in the morning.  She does however have increased work of breathing again and will require another neb therapy.  This point, will need admission for COPD exacerbation and probable secondary pneumonia.  Patient does report persisting cough productive of sputum.  Will start Rocephin and Zithromax as well.  Consult: [facility] hospitalist Dr. Erenest Blank for admission.         Arby Barrette, MD 09/09/22 1610    Arby Barrette, MD 09/09/22 1242    Arby Barrette, MD 09/09/22 [phone]
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
## 2022-09-09 NOTE — ED Notes (Signed)
Pt placed on Bi-Pap at this time by RT

## 2022-09-09 NOTE — ED Notes (Signed)
Dr. Adela Lank at the bedside, RT called for possible Bi-Pap

## 2022-09-09 NOTE — ED Notes (Signed)
Pt placed on NRB, sats improving at this time, awaiting RT to bedside with Bi-Pap machine

## 2022-09-09 NOTE — ED Triage Notes (Signed)
Pt arrived from home POV for SOB since last night, gave self breathing treatment this am w/o relief. Pt reports wears 02, came in w/o oxygen, sats 62% on RA, tri-podding, tachypnea, bilateral wheezing noted, pt has COPD, reports recent productive cough. Pt currently A&O x4, answering questions, pt with moderate distress. RT called.

## 2022-09-10 DIAGNOSIS — J189 Pneumonia, unspecified organism: Secondary | ICD-10-CM

## 2022-09-10 DIAGNOSIS — J9621 Acute and chronic respiratory failure with hypoxia: Secondary | ICD-10-CM

## 2022-09-10 DIAGNOSIS — J441 Chronic obstructive pulmonary disease with (acute) exacerbation: Secondary | ICD-10-CM | POA: Diagnosis not present

## 2022-09-10 LAB — BASIC METABOLIC PANEL
Anion gap: 11 (ref 5–15)
BUN: 9 mg/dL (ref 8–23)
CO2: 25 mmol/L (ref 22–32)
Calcium: 9.2 mg/dL (ref 8.9–10.3)
Chloride: 101 mmol/L (ref 98–111)
Creatinine, Ser: 0.47 mg/dL (ref 0.44–1.00)
GFR, Estimated: >60 mL/min (ref >60)
Glucose, Bld: 136 mg/dL — ABNORMAL HIGH (ref 70–99)
Potassium: 4 mmol/L (ref 3.5–5.1)
Sodium: 137 mmol/L (ref 135–145)

## 2022-09-10 LAB — CBC
HCT: 36.4 % (ref 36.0–46.0)
Hemoglobin: 11.5 g/dL — ABNORMAL LOW (ref 12.0–15.0)
MCH: 28.5 pg (ref 26.0–34.0)
MCHC: 31.6 g/dL (ref 30.0–36.0)
MCV: 90.1 fL (ref 80.0–100.0)
Platelets: 313 10*3/uL (ref 150–400)
RBC: 4.04 MIL/uL (ref 3.87–5.11)
RDW: 15 % (ref 11.5–15.5)
WBC: 7.7 10*3/uL (ref 4.0–10.5)
nRBC: 0 % (ref 0.0–0.2)

## 2022-09-10 MED ORDER — PREDNISONE 10 MG PO TABS: ORAL_TABLET | ORAL | 0 refills | Status: AC

## 2022-09-10 MED ORDER — LEVOFLOXACIN 750 MG PO TABS: 750.0000 mg | ORAL_TABLET | Freq: Every day | ORAL | 0 refills | Status: AC

## 2022-09-10 NOTE — Discharge Summary (Signed)
Physician Discharge Summary  [name]:[id number] DOB: [birth year] DOA: 09/09/2022  PCP: Jackie Plum, MD  Admit date: 09/09/2022 Discharge date: 09/10/2022  Admitted From: Home Disposition: Home  Recommendations for Outpatient Follow-up:  Follow up with PCP in 1-2 weeks Follow-up with pulmonology, Dr. Meier/Dr. Marchelle Gearing as scheduled Continue Levaquin and prednisone taper on discharge Continue to encourage compliance with home medications; especially COPD controlling inhaler use as this is the likely etiology of noncompliance leading to her hospitalization Given her severe COPD, recommend continue goals of care discussion outpatient with her PCP and specialist   Home Health: No Equipment/Devices: Remains on her home oxygen 5 L per nasal cannula  Discharge Condition: Stable CODE STATUS: Full code Diet recommendation: Heart healthy diet  History of present illness:  [name] is a 62 year old female with past medical history significant for chronic hypoxic respiratory failure, COPD on 5 L nasal cannula at baseline, HTN, cardiomyopathy, HLD, CAD who presented to [facility] ED on 4/26 with progressive shortness of breath.  She was noted to have oxygen saturation of 60% on room air, tripoding, tachypneic with bilateral wheezing and productive cough.  Patient was initially placed on BiPAP and transition back to nasal cannula in the ED.  Workup in the ED with CT angiogram negative for PE but with concern for right-sided infiltrate versus consolidation.  Patient was started on azithromycin and ceftriaxone,, IV steroids and neb treatments.  TRH consulted for admission for further evaluation and management of acute on chronic respiratory failure secondary to COPD exacerbation, commune acquired pneumonia.  Hospital course:  Acute on chronic hypoxic respiratory failure Patient presenting to ED with progressive shortness of breath was found to be hypoxic with SpO2 60%  in the ED with associated with tachypnea, tachycardia.  Patient initially was placed on BiPAP.  Etiology likely secondary to COPD exacerbation in the setting of medical noncompliance as well as concern for community-acquired pneumonia.  Patient was transition from BiPAP back to nasal cannula was actually saturating well on 3 L nasal cannula at time of discharge which is lower than her typical baseline of 5 L nasal cannula.  Outpatient follow-up with PCP/pulmonology.  COPD exacerbation Patient was treated with scheduled neb treatments, IV steroids and antibiotics with improvement of her symptoms.  Discussed extensively that she needs to comply with her home treatment that includes Breztri and Singulair that she does have access to.  Patient was initially started on BiPAP and transition back to nasal cannula at 3 L/min which is lower than her typical baseline of 5 L per nasal cannula.  Patient's symptoms markedly improved.  Discharging home on steroid taper and Levaquin to complete treatment course.  Discussed with her needs to comply with daily use of her Breztri and Singulair.  Community-acquired pneumonia CT angiogram chest negative for PE but with persistent nodular airspace disease right middle lobe.  Given her severe underlying structural lung disease, patient was started on Levaquin and will continue on discharge to complete 7-day course.  Patient oxygenating well on lower dose of her home Supple oxygen, 3 L per nasal cannula at time of discharge with a baseline of 5 L nasal cannula.  Essential hypertension Cardiomyopathy Hyperlipidemia Patient follows with cardiology outpatient, Dr. Dulce Sellar.  Continue aspirin, Repatha.  GERD Continue Pepcid  Medical noncompliance Patient reports intermittent use of her home COPD controller medications as she is concerned that there are interactions with her cardiac medications.  Discussed with her that she needs complete compliance with these medications as  they are long-term use to assist in controlling her symptoms and this is the leading etiology likely to her recurrent hospitalizations.  Patient reports she has access to her medications at home and that she has received samples from her pulmonologist; as well has adequate refills at her pharmacy.  Recommend continue discussion regarding goals of care given her severe COPD with her PCP and specialist as well as continued encouragement for compliance with her home medication regimen.  Discharge Diagnoses:  Principal Problem:   CAP (community acquired pneumonia) Active Problems:   Hypokalemia   GERD (gastroesophageal reflux disease)   Hyperlipidemia   Essential hypertension   Chronic respiratory failure with hypoxia (HCC)   Emphysema lung (HCC)   COPD exacerbation (HCC)   Cardiomyopathy, unspecified (HCC)    Discharge Instructions  Discharge Instructions     Call MD for:  difficulty breathing, headache or visual disturbances   Complete by: As directed    Call MD for:  extreme fatigue   Complete by: As directed    Call MD for:  persistant dizziness or light-headedness   Complete by: As directed    Call MD for:  persistant nausea and vomiting   Complete by: As directed    Call MD for:  severe uncontrolled pain   Complete by: As directed    Call MD for:  temperature >100.4   Complete by: As directed    Diet - low sodium heart healthy   Complete by: As directed    Increase activity slowly   Complete by: As directed       Allergies as of 09/10/2022       Reactions   Amoxicillin Shortness Of Breath   Tylenol [acetaminophen] Shortness Of Breath   Statins    Zetia [ezetimibe]         Medication List     TAKE these medications    albuterol 108 (90 Base) MCG/ACT inhaler Commonly known as: VENTOLIN HFA Inhale 2 puffs into the lungs every 4 (four) hours as needed for wheezing or shortness of breath.   albuterol (2.5 MG/3ML) 0.083% nebulizer solution Commonly known as:  PROVENTIL Take 3 mLs (2.5 mg total) by nebulization every 4 (four) hours as needed for wheezing or shortness of breath.   aspirin EC 81 MG tablet Take 1 tablet (81 mg total) by mouth daily. Swallow whole.   Breztri Aerosphere 160-9-4.8 MCG/ACT Aero Generic drug: Budeson-Glycopyrrol-Formoterol Inhale 2 puffs into the lungs in the morning and at bedtime.   famotidine 20 MG tablet Commonly known as: PEPCID Take 1 tablet (20 mg total) by mouth daily. Must keep scheduled appointment for future refills What changed: when to take this   levofloxacin 750 MG tablet Commonly known as: Levaquin Take 1 tablet (750 mg total) by mouth daily for 5 days. Start taking on: September 11, 2022   montelukast 10 MG tablet Commonly known as: SINGULAIR TAKE 1 TABLET BY MOUTH EVERYDAY AT BEDTIME   OXYGEN Inhale 5 L into the lungs at bedtime.   predniSONE 10 MG tablet Commonly known as: DELTASONE Take 5 tablets (50 mg total) by mouth daily for 3 days, THEN 4 tablets (40 mg total) daily for 3 days, THEN 3 tablets (30 mg total) daily for 3 days, THEN 2 tablets (20 mg total) daily for 3 days, THEN 1 tablet (10 mg total) daily for 3 days. Start taking on: September 04, 2022   Repatha SureClick 140 MG/ML Soaj Generic drug: Evolocumab Inject 140 mg into the skin every  14 (fourteen) days.        Follow-up Information     Osei-Bonsu, Greggory Stallion, MD. Schedule an appointment as soon as possible for a visit in 1 week(s).   Specialty: Internal Medicine Contact information: [address] [place] [address] [phone]         Omar Person, MD. Schedule an appointment as soon as possible for a visit.   Specialty: Pulmonary Disease Contact information: [address] [place] [address] [phone]                Allergies  Allergen Reactions   Amoxicillin Shortness Of Breath   Tylenol [Acetaminophen] Shortness Of Breath   Statins    Zetia [Ezetimibe]      Consultations: none   Procedures/Studies: CT Angio Chest PE W/Cm &/Or Wo Cm  Result Date: 09/09/2022 CLINICAL DATA:  Difficulty breathing, tachypnea, wheezing, COPD, PE suspected EXAM: CT ANGIOGRAPHY CHEST WITH CONTRAST TECHNIQUE: Multidetector CT imaging of the chest was performed using the standard protocol during bolus administration of intravenous contrast. Multiplanar CT image reconstructions and MIPs were obtained to evaluate the vascular anatomy. RADIATION DOSE REDUCTION: This exam was performed according to the departmental dose-optimization program which includes automated exposure control, adjustment of the mA and/or kV according to patient size and/or use of iterative reconstruction technique. CONTRAST:  75mL OMNIPAQUE IOHEXOL 350 MG/ML SOLN COMPARISON:  08/26/2022 CT chest 05/20/2022 CTA chest FINDINGS: Cardiovascular: Satisfactory opacification of the pulmonary arteries to the segmental level. Evaluation of the lower lobes is somewhat limited by motion artifact. Within this limitation, no evidence of pulmonary embolism. Cardiomegaly. No pericardial effusion. Coronary artery calcifications. Aortic atherosclerosis. Mediastinum/Nodes: No enlarged mediastinal, hilar, or axillary lymph nodes. Thyroid gland, trachea, and esophagus demonstrate no significant findings. Lungs/Pleura: Centrilobular and paraseptal emphysema. Bronchial wall thickening. Redemonstrated narrowing infection in the right middle lobe with slightly increased nodular airspace disease (series 10, images 280-289). No other focal airspace opacity. Previously noted nodular opacities in the posterior right lower lobe are no longer seen. No pleural effusion or pneumothorax. Upper Abdomen: No acute abnormality. Musculoskeletal: No acute osseous abnormality. Review of the MIP images confirms the above findings. IMPRESSION: 1. Evaluation of the lower lobes is somewhat limited by motion artifact. Within this limitation, no evidence of  pulmonary embolism to the segmental level. 2. Redemonstrated nodular airspace disease in the right middle lobe, likely infectious or inflammatory. 3. Previously noted nodular opacities in the posterior right lower lobe are no longer seen. 4. Aortic atherosclerosis and emphysema. Aortic Atherosclerosis (ICD10-I70.0) and Emphysema (ICD10-J43.9). Electronically Signed   By: Wiliam Ke M.D.   On: 09/09/2022 12:19   DG Chest Port 1 View  Result Date: 09/09/2022 CLINICAL DATA:  Shortness of breath. EXAM: PORTABLE CHEST 1 VIEW COMPARISON:  08/12/2022 FINDINGS: Lungs are hyperexpanded. Cardiopericardial silhouette is at upper limits of normal for size. Streaky opacity at the lung bases is similar to prior compatible with atelectasis or scarring. No focal consolidative opacity or evidence of pulmonary edema. Telemetry leads overlie the chest. IMPRESSION: Hyperexpansion with bibasilar atelectasis or scarring. No acute cardiopulmonary findings. Electronically Signed   By: Kennith Center M.D.   On: 09/09/2022 07:01   CT Chest Wo Contrast  Result Date: 08/29/2022 CLINICAL DATA:  Pulmonary nodule. EXAM: CT CHEST WITHOUT CONTRAST TECHNIQUE: Multidetector CT imaging of the chest was performed following the standard protocol without IV contrast. RADIATION DOSE REDUCTION: This exam was performed according to the departmental dose-optimization program which includes automated exposure control, adjustment of  the mA and/or kV according to patient size and/or use of iterative reconstruction technique. COMPARISON:  05/20/2022 FINDINGS: Cardiovascular: The heart size is normal. No substantial pericardial effusion. Coronary artery calcification is evident. Mild atherosclerotic calcification is noted in the wall of the thoracic aorta. Mediastinum/Nodes: No mediastinal lymphadenopathy. No evidence for gross hilar lymphadenopathy although assessment is limited by the lack of intravenous contrast on the current study. The esophagus  has normal imaging features. There is no axillary lymphadenopathy. Lungs/Pleura: Centrilobular and paraseptal emphysema evident. Bronchial wall thickening noted in the lower lungs bilaterally, right greater than left. Peripheral airway impaction noted right middle lobe with clustered nodular airspace disease in the inferior right middle lobe (113/7) with dominant nodular component measuring up to 15 mm. Interval improvement in aeration in the posterior lung bases with some residual nodular and ill-defined patchy airspace disease in the peripheral right base. No pleural effusion. Upper Abdomen: Visualized portion of the upper abdomen is unremarkable. Musculoskeletal: No worrisome lytic or sclerotic osseous abnormality. IMPRESSION: 1. Interval improvement in aeration in the posterior lung bases with some residual nodular and ill-defined patchy airspace disease in the peripheral right base. 2. Peripheral airway impaction in the right middle lobe with clustered nodular airspace disease in the inferior right middle lobe with dominant nodular component measuring up to 15 mm. Imaging features likely related to infectious/inflammatory etiology. Follow-up CT chest in 3 months recommended to ensure resolution. 3. Aortic Atherosclerosis (ICD10-I70.0) and Emphysema (ICD10-J43.9). Electronically Signed   By: Kennith Center M.D.   On: 08/29/2022 12:16   DG Abd 1 View  Result Date: 08/12/2022 CLINICAL DATA:  Check gastric catheter placement, evaluate for possible ileus EXAM: ABDOMEN - 1 VIEW COMPARISON:  08/09/2022 FINDINGS: Gastric catheter is noted within the distal stomach. No findings to suggest obstruction or ileus are noted. No free air is seen. Pessary is noted in the low pelvis. Degenerative changes of the lumbar spine are noted. IMPRESSION: No findings to suggest obstruction or ileus. Electronically Signed   By: Alcide Clever M.D.   On: 08/12/2022 02:09   DG Chest Port 1 View  Result Date: 08/12/2022 CLINICAL DATA:   Acute respiratory failure EXAM: PORTABLE CHEST 1 VIEW COMPARISON:  08/10/2022 FINDINGS: Cardiac shadow is prominent but stable. Endotracheal tube and gastric catheter are noted in satisfactory position. No focal infiltrate or effusion is seen. No other focal abnormality is noted. IMPRESSION: Tubes and lines in satisfactory position. No acute abnormality noted. Electronically Signed   By: Alcide Clever M.D.   On: 08/12/2022 02:09     Subjective: Patient seen examined at bedside, resting calmly.  Lying in bed.  Dyspnea much improved, now oxygenating 97% on 3 L nasal cannula which is below her baseline of 5 L nasal cannula.  Ready for discharge home.  Discussed with patient extensively needs compliance with her home Breztri and Singulair as this is the leading etiology to her hospitalization as she needs to ensure she takes her controlling COPD medications daily.  Patient with no other specific complaints or concerns at this time.  Denies headache, no fever/chills/night sweats, no nausea/vomiting/diarrhea, no shortness of breath greater than her typical baseline, no chest pain, no palpitations, no abdominal pain, no focal weakness, no fatigue, no paresthesias.  No acute concerns overnight per nursing staff.  Discharge Exam: Vitals:   09/10/22 0517 09/10/22 0758  BP: 117/86   Pulse: 94   Resp: 20   Temp: 98.2 F (36.8 C)   SpO2: 97% 95%   Vitals:  09/09/22 1946 09/10/22 0002 09/10/22 0517 09/10/22 0758  BP: 106/70 127/82 117/86   Pulse: 86 81 94   Resp: 20 18 20    Temp: 98 F (36.7 C) 98.1 F (36.7 C) 98.2 F (36.8 C)   TempSrc: Oral Oral Oral   SpO2: 97% 98% 97% 95%  Weight:      Height:        Physical Exam: GEN: NAD, alert and oriented x 3, chronically ill appearance, appears older than stated age HEENT: NCAT, PERRL, EOMI, sclera clear, MMM PULM: Diminished breath sounds bilateral bases, late expiratory wheezing bilateral upper lung fields, no crackles, normal respiratory effort  without accessory muscle use, on 3 L nasal cannula with SpO2 97% at rest (baseline 5L [place]) CV: RRR w/o M/G/R GI: abd soft, NTND, NABS, no R/G/M MSK: no peripheral edema, moves all extremities independently with preserved strength NEURO: CN II-XII intact, no focal deficits, sensation to light touch intact PSYCH: normal mood/affect Integumentary: No concerning rashes/lesions/wounds noted on exposed skin surfaces.    The results of significant diagnostics from this hospitalization (including imaging, microbiology, ancillary and laboratory) are listed below for reference.     Microbiology: No results found for this or any previous visit (from the past 240 hour(s)).   Labs: BNP (last 3 results) Recent Labs    04/24/22 0055 08/07/22 2217 09/09/22 0637  BNP 46.3 185.2* 14.8   Basic Metabolic Panel: Recent Labs  Lab 09/09/22 0729 09/10/22 0558  NA 138 137  K 3.4* 4.0  CL 101 101  CO2 26 25  GLUCOSE 99 136*  BUN 7* 9  CREATININE 0.51 0.47  CALCIUM 8.9 9.2   Liver Function Tests: No results for input(s): "AST", "ALT", "ALKPHOS", "BILITOT", "PROT", "ALBUMIN" in the last 168 hours. No results for input(s): "LIPASE", "AMYLASE" in the last 168 hours. No results for input(s): "AMMONIA" in the last 168 hours. CBC: Recent Labs  Lab 09/09/22 0637 09/10/22 0558  WBC 6.1 7.7  NEUTROABS 3.6  --   HGB 12.5 11.5*  HCT 40.4 36.4  MCV 92.2 90.1  PLT 327 313   Cardiac Enzymes: No results for input(s): "CKTOTAL", "CKMB", "CKMBINDEX", "TROPONINI" in the last 168 hours. BNP: Invalid input(s): "POCBNP" CBG: No results for input(s): "GLUCAP" in the last 168 hours. D-Dimer No results for input(s): "DDIMER" in the last 72 hours. Hgb A1c No results for input(s): "HGBA1C" in the last 72 hours. Lipid Profile No results for input(s): "CHOL", "HDL", "LDLCALC", "TRIG", "CHOLHDL", "LDLDIRECT" in the last 72 hours. Thyroid function studies No results for input(s): "TSH", "T4TOTAL",  "T3FREE", "THYROIDAB" in the last 72 hours.  Invalid input(s): "FREET3" Anemia work up No results for input(s): "VITAMINB12", "FOLATE", "FERRITIN", "TIBC", "IRON", "RETICCTPCT" in the last 72 hours. Urinalysis    Component Value Date/Time   COLORURINE BROWN (A) 08/10/2022 0952   APPEARANCEUR CLOUDY (A) 08/10/2022 0952   LABSPEC 1.026 08/10/2022 0952   PHURINE 5.0 08/10/2022 0952   GLUCOSEU NEGATIVE 08/10/2022 0952   HGBUR LARGE (A) 08/10/2022 0952   BILIRUBINUR NEGATIVE 08/10/2022 0952   BILIRUBINUR neg 04/24/2020 1520   KETONESUR NEGATIVE 08/10/2022 0952   PROTEINUR 100 (A) 08/10/2022 0952   UROBILINOGEN negative (A) 04/24/2020 1520   UROBILINOGEN 0.2 03/07/2015 1830   NITRITE NEGATIVE 08/10/2022 0952   LEUKOCYTESUR NEGATIVE 08/10/2022 0952   Sepsis Labs Recent Labs  Lab 09/09/22 0637 09/10/22 0558  WBC 6.1 7.7   Microbiology No results found for this or any previous visit (from the past 240 hour(s)).  Time coordinating discharge: Over 30 minutes  SIGNED:   Alvira Philips [place], DO  [facility] 09/10/2022, 9:39 AM

## 2022-09-12 ENCOUNTER — Other Ambulatory Visit (HOSPITAL_COMMUNITY): Payer: Self-pay

## 2022-09-13 ENCOUNTER — Encounter: Payer: Self-pay | Admitting: Obstetrics and Gynecology

## 2022-09-13 ENCOUNTER — Ambulatory Visit (INDEPENDENT_AMBULATORY_CARE_PROVIDER_SITE_OTHER): Payer: BC Managed Care – PPO | Admitting: Obstetrics and Gynecology

## 2022-09-13 ENCOUNTER — Other Ambulatory Visit (HOSPITAL_COMMUNITY): Payer: Self-pay

## 2022-09-13 ENCOUNTER — Ambulatory Visit: Payer: BC Managed Care – PPO | Admitting: Obstetrics and Gynecology

## 2022-09-13 VITALS — BP 118/77 | HR 103

## 2022-09-13 DIAGNOSIS — N812 Incomplete uterovaginal prolapse: Secondary | ICD-10-CM

## 2022-09-13 DIAGNOSIS — Z4689 Encounter for fitting and adjustment of other specified devices: Secondary | ICD-10-CM

## 2022-09-13 DIAGNOSIS — N8110 Cystocele, unspecified: Secondary | ICD-10-CM

## 2022-09-13 NOTE — Patient Instructions (Signed)
Please call if you have any issues or need a sooner appointment. We will order the ring so if you want to change it back to the ring you can.
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
## 2022-09-14 ENCOUNTER — Ambulatory Visit: Payer: BC Managed Care – PPO | Attending: Pulmonary Disease | Admitting: Physical Therapy

## 2022-09-14 ENCOUNTER — Encounter: Payer: Self-pay | Admitting: Physical Therapy

## 2022-09-14 DIAGNOSIS — R2689 Other abnormalities of gait and mobility: Secondary | ICD-10-CM | POA: Insufficient documentation

## 2022-09-14 DIAGNOSIS — M6281 Muscle weakness (generalized): Secondary | ICD-10-CM | POA: Insufficient documentation

## 2022-09-14 DIAGNOSIS — R5381 Other malaise: Secondary | ICD-10-CM
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
## 2022-09-16 ENCOUNTER — Encounter: Payer: Self-pay | Admitting: Physical Therapy

## 2022-09-16 ENCOUNTER — Ambulatory Visit: Payer: BC Managed Care – PPO | Admitting: Physical Therapy

## 2022-09-16 DIAGNOSIS — R5381 Other malaise: Secondary | ICD-10-CM

## 2022-09-16 DIAGNOSIS — M6281 Muscle weakness (generalized): Secondary | ICD-10-CM
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
## 2022-09-19 ENCOUNTER — Telehealth (HOSPITAL_COMMUNITY): Payer: Self-pay

## 2022-09-19 ENCOUNTER — Telehealth: Payer: Self-pay | Admitting: Pulmonary Disease

## 2022-09-19 NOTE — Telephone Encounter (Signed)
Patient came in requesting a letter for her employer that states she can go back to work on Monday May 13th, 2024. She would like to know if she could pick this up tomorrow when Dr. Tomie China is in [place]. CB# [phone]
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
## 2022-09-19 NOTE — Telephone Encounter (Signed)
No response from pt.  Closed referral  

## 2022-09-19 NOTE — Telephone Encounter (Signed)
Lvm to call back

## 2022-09-19 NOTE — Telephone Encounter (Signed)
Appointment made for pt 09/20/22

## 2022-09-19 NOTE — Telephone Encounter (Signed)
Staff message received from Dr. Prescott Parma, MD  Tami Doe, LPN She just got discharged from hospital for COPD exacerbation.  It is too late for a TOC call but can you ensure that she is seen in clinic in the next week or 2 with me or APP.   ATC patient.  Left message for patient to call back to schedule HFU with Dr. Isaiah Serge or NP in the next 2 weeks.

## 2022-09-20 ENCOUNTER — Encounter: Payer: Self-pay | Admitting: Cardiology

## 2022-09-20 ENCOUNTER — Ambulatory Visit: Payer: BC Managed Care – PPO

## 2022-09-20 ENCOUNTER — Telehealth: Payer: Self-pay | Admitting: Pulmonary Disease

## 2022-09-20 ENCOUNTER — Ambulatory Visit: Payer: BC Managed Care – PPO | Attending: Cardiology | Admitting: Cardiology

## 2022-09-20 VITALS — BP 130/78 | HR 90 | Ht 63.0 in | Wt 147.1 lb

## 2022-09-20 DIAGNOSIS — R2689 Other abnormalities of gait and mobility: Secondary | ICD-10-CM

## 2022-09-20 DIAGNOSIS — E782 Mixed hyperlipidemia: Secondary | ICD-10-CM

## 2022-09-20 DIAGNOSIS — I429 Cardiomyopathy, unspecified: Secondary | ICD-10-CM

## 2022-09-20 DIAGNOSIS — I2510 Atherosclerotic heart disease of native coronary artery without angina pectoris: Secondary | ICD-10-CM | POA: Diagnosis not present

## 2022-09-20 DIAGNOSIS — M6281 Muscle weakness (generalized): Secondary | ICD-10-CM

## 2022-09-20 DIAGNOSIS — R5381 Other malaise: Secondary | ICD-10-CM

## 2022-09-20 DIAGNOSIS — I10 Essential (primary) hypertension: Secondary | ICD-10-CM | POA: Diagnosis not present

## 2022-09-20 DIAGNOSIS — I700 Atherosclerosis of aorta: Secondary | ICD-10-CM

## 2022-09-20 DIAGNOSIS — J449 Chronic obstructive pulmonary disease, unspecified: Secondary | ICD-10-CM

## 2022-09-20 LAB — COMPREHENSIVE METABOLIC PANEL
ALT: 12 IU/L (ref 0–32)
AST: 12 IU/L (ref 0–40)
Albumin/Globulin Ratio: 1.9 (ref 1.2–2.2)
Albumin: 4.2 g/dL (ref 3.9–4.9)
Alkaline Phosphatase: 53 IU/L (ref 44–121)
BUN/Creatinine Ratio: 17 (ref 12–28)
BUN: 10 mg/dL (ref 8–27)
Bilirubin Total: 0.3 mg/dL (ref 0.0–1.2)
CO2: 27 mmol/L (ref 20–29)
Calcium: 9.4 mg/dL (ref 8.7–10.3)
Chloride: 100 mmol/L (ref 96–106)
Creatinine, Ser: 0.58 mg/dL (ref 0.57–1.00)
Globulin, Total: 2.2 g/dL (ref 1.5–4.5)
Glucose: 71 mg/dL (ref 70–99)
Potassium: 3.4 mmol/L — ABNORMAL LOW (ref 3.5–5.2)
Sodium: 144 mmol/L (ref 134–144)
Total Protein: 6.4 g/dL (ref 6.0–8.5)
eGFR: 101 mL/min/{1.73_m2} (ref >59)

## 2022-09-20 LAB — LIPID PANEL
Chol/HDL Ratio: 1.8 ratio (ref 0.0–4.4)
Cholesterol, Total: 188 mg/dL (ref 100–199)
HDL: 102 mg/dL (ref >39)
LDL Chol Calc (NIH): 75 mg/dL (ref 0–99)
Triglycerides: 55 mg/dL (ref 0–149)
VLDL Cholesterol Cal: 11 mg/dL (ref 5–40)

## 2022-09-20 MED ORDER — METOPROLOL TARTRATE 100 MG PO TABS: 100.0000 mg | ORAL_TABLET | Freq: Once | ORAL | 0 refills | Status: DC

## 2022-09-20 NOTE — Telephone Encounter (Signed)
See 09/20/22 encounter for Dr. Isaiah Serge message.
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
## 2022-09-20 NOTE — Patient Instructions (Signed)
Medication Instructions:  Your physician recommends that you continue on your current medications as directed. Please refer to the Current Medication list given to you today.   *If you need a refill on your cardiac medications before your next appointment, please call your pharmacy*   Lab Work: None ordered  If you have labs (blood work) drawn today and your tests are completely normal, you will receive your results only by: MyChart Message (if you have MyChart) OR A paper copy in the mail If you have any lab test that is abnormal or we need to change your treatment, we will call you to review the results.   Testing/Procedures:   Your cardiac CT will be scheduled at one of the below locations:   [facility] [address] [place], [place] [address] [phone]  If scheduled at [facility], please arrive at the [facility] and Children's Entrance (Entrance C2) of [facility] 30 minutes prior to test start time. You can use the FREE valet parking offered at entrance C (encouraged to control the heart rate for the test)  Proceed to the [facility] Radiology Department (first floor) to check-in and test prep.  All radiology patients and guests should use entrance C2 at [facility], accessed from [facility], even though the hospital's physical address listed is [address].     Please follow these instructions carefully (unless otherwise directed):  Hold all erectile dysfunction medications at least 3 days (72 hrs) prior to test. (Ie viagra, cialis, sildenafil, tadalafil, etc) We will administer nitroglycerin during this exam.   On the Night Before the Test: Be sure to Drink plenty of water. Do not consume any caffeinated/decaffeinated beverages or chocolate 12 hours prior to your test. Do not take any antihistamines 12 hours prior to your test.  On the Day of the Test: Drink plenty of water until 1 hour prior to the  test. Do not eat any food 1 hour prior to test. You may take your regular medications prior to the test.  Take metoprolol (Lopressor) two hours prior to test. This is a one time dose. FEMALES- please wear underwire-free bra if available, avoid dresses & tight clothing  After the Test: Drink plenty of water. After receiving IV contrast, you may experience a mild flushed feeling. This is normal. On occasion, you may experience a mild rash up to 24 hours after the test. This is not dangerous. If this occurs, you can take Benadryl 25 mg and increase your fluid intake. If you experience trouble breathing, this can be serious. If it is severe call 911 IMMEDIATELY. If it is mild, please call our office. If you take any of these medications: Glipizide/Metformin, Avandament, Glucavance, please do not take 48 hours after completing test unless otherwise instructed.  We will call to schedule your test 2-4 weeks out understanding that some insurance companies will need an authorization prior to the service being performed.   For non-scheduling related questions, please contact the cardiac imaging nurse navigator should you have any questions/concerns: Rockwell Alexandria, Cardiac Imaging Nurse Navigator Larey Brick, Cardiac Imaging Nurse Navigator [facility] Direct Office Dial: [phone]   For scheduling needs, including cancellations and rescheduling, please call [place], [phone].   Your next appointment:   9 month(s)  The format for your next appointment:   In Person  Provider:   Belva Crome, MD   Other Instructions Cardiac CT Angiogram A cardiac CT angiogram is  a procedure to look at the heart and the area around the heart. It may be done to help find the cause of chest pains or other symptoms of heart disease. During this procedure, a substance called contrast dye is injected into the blood vessels in the area to be checked. A large X-ray machine, called  a CT scanner, then takes detailed pictures of the heart and the surrounding area. The procedure is also sometimes called a coronary CT angiogram, coronary artery scanning, or CTA. A cardiac CT angiogram allows the health care provider to see how well blood is flowing to and from the heart. The health care provider will be able to see if there are any problems, such as: Blockage or narrowing of the coronary arteries in the heart. Fluid around the heart. Signs of weakness or disease in the muscles, valves, and tissues of the heart. Tell a health care provider about: Any allergies you have. This is especially important if you have had a previous allergic reaction to contrast dye. All medicines you are taking, including vitamins, herbs, eye drops, creams, and over-the-counter medicines. Any blood disorders you have. Any surgeries you have had. Any medical conditions you have. Whether you are pregnant or may be pregnant. Any anxiety disorders, chronic pain, or other conditions you have that may increase your stress or prevent you from lying still. What are the risks? Generally, this is a safe procedure. However, problems may occur, including: Bleeding. Infection. Allergic reactions to medicines or dyes. Damage to other structures or organs. Kidney damage from the contrast dye that is used. Increased risk of cancer from radiation exposure. This risk is low. Talk with your health care provider about: The risks and benefits of testing. How you can receive the lowest dose of radiation. What happens before the procedure? Wear comfortable clothing and remove any jewelry, glasses, dentures, and hearing aids. Follow instructions from your health care provider about eating and drinking. This may include: For 12 hours before the procedure -- avoid caffeine. This includes tea, coffee, soda, energy drinks, and diet pills. Drink plenty of water or other fluids that do not have caffeine in them. Being well  hydrated can prevent complications. For 4-6 hours before the procedure -- stop eating and drinking. The contrast dye can cause nausea, but this is less likely if your stomach is empty. Ask your health care provider about changing or stopping your regular medicines. This is especially important if you are taking diabetes medicines, blood thinners, or medicines to treat problems with erections (erectile dysfunction). What happens during the procedure?  Hair on your chest may need to be removed so that small sticky patches called electrodes can be placed on your chest. These will transmit information that helps to monitor your heart during the procedure. An IV will be inserted into one of your veins. You might be given a medicine to control your heart rate during the procedure. This will help to ensure that good images are obtained. You will be asked to lie on an exam table. This table will slide in and out of the CT machine during the procedure. Contrast dye will be injected into the IV. You might feel warm, or you may get a metallic taste in your mouth. You will be given a medicine called nitroglycerin. This will relax or dilate the arteries in your heart. The table that you are lying on will move into the CT machine tunnel for the scan. The person running the machine will give you  instructions while the scans are being done. You may be asked to: Keep your arms above your head. Hold your breath. Stay very still, even if the table is moving. When the scanning is complete, you will be moved out of the machine. The IV will be removed. The procedure may vary among health care providers and hospitals. What can I expect after the procedure? After your procedure, it is common to have: A metallic taste in your mouth from the contrast dye. A feeling of warmth. A headache from the nitroglycerin. Follow these instructions at home: Take over-the-counter and prescription medicines only as told by your  health care provider. If you are told, drink enough fluid to keep your urine pale yellow. This will help to flush the contrast dye out of your body. Most people can return to their normal activities right after the procedure. Ask your health care provider what activities are safe for you. It is up to you to get the results of your procedure. Ask your health care provider, or the department that is doing the procedure, when your results will be ready. Keep all follow-up visits as told by your health care provider. This is important. Contact a health care provider if: You have any symptoms of allergy to the contrast dye. These include: Shortness of breath. Rash or hives. A racing heartbeat. Summary A cardiac CT angiogram is a procedure to look at the heart and the area around the heart. It may be done to help find the cause of chest pains or other symptoms of heart disease. During this procedure, a large X-ray machine, called a CT scanner, takes detailed pictures of the heart and the surrounding area after a contrast dye has been injected into blood vessels in the area. Ask your health care provider about changing or stopping your regular medicines before the procedure. This is especially important if you are taking diabetes medicines, blood thinners, or medicines to treat erectile dysfunction. If you are told, drink enough fluid to keep your urine pale yellow. This will help to flush the contrast dye out of your body. This information is not intended to replace advice given to you by your health care provider. Make sure you discuss any questions you have with your health care provider. Document Revised: 12/26/2018 Document Reviewed: 12/26/2018 Elsevier Patient Education  2020 [employer].

## 2022-09-20 NOTE — Telephone Encounter (Signed)
ATC patient to schedule follow up with Dr. Isaiah Serge or NP.  LM to call back when available.     Staff message received from Dr. Prescott Parma, MD  Jacquiline Doe, LPN She just got discharged from hospital for COPD exacerbation.  It is too late for a TOC call but can you ensure that she is seen in clinic in the next week or 2 with me or APP.     ATC patient.  Left message for patient to call back to schedule HFU with Dr. Isaiah Serge or NP in the next 2 weeks

## 2022-09-22 ENCOUNTER — Ambulatory Visit: Payer: BC Managed Care – PPO

## 2022-09-22 NOTE — Telephone Encounter (Signed)
Tried calling the pt again, still no answer and her VM was full. I have sent her msg via mychart to call and schedule HFU.

## 2022-09-23 ENCOUNTER — Encounter: Payer: Self-pay | Admitting: Physical Therapy

## 2022-09-23 ENCOUNTER — Ambulatory Visit: Payer: BC Managed Care – PPO | Admitting: Physical Therapy

## 2022-09-23 DIAGNOSIS — R5381 Other malaise: Secondary | ICD-10-CM | POA: Diagnosis not present

## 2022-09-23 DIAGNOSIS — R2689 Other abnormalities of gait and mobility: Secondary | ICD-10-CM

## 2022-09-23 DIAGNOSIS — M6281 Muscle weakness (generalized): Secondary | ICD-10-CM
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
## 2022-09-27 ENCOUNTER — Telehealth (HOSPITAL_COMMUNITY): Payer: Self-pay | Admitting: *Deleted

## 2022-09-27 NOTE — Telephone Encounter (Signed)
Reaching out to patient to offer assistance regarding upcoming cardiac imaging study; pt verbalizes understanding of appt date/time, parking situation and where to check in, pre-test NPO status and medications ordered, and verified current allergies; name and call back number provided for further questions should they arise  Kalea Perine RN Navigator Cardiac Imaging [facility] [phone] office [phone] cell  Patient to take 100mg metoprolol tartrate two hours prior to her cardiac CT scan.  She is aware to arrive at 8am. 

## 2022-09-28 ENCOUNTER — Telehealth: Payer: Self-pay | Admitting: Cardiology

## 2022-09-28 ENCOUNTER — Ambulatory Visit (HOSPITAL_COMMUNITY)
Admission: RE | Admit: 2022-09-28 | Discharge: 2022-09-28 | Disposition: A | Discharge status: Home / Self Care | Payer: BC Managed Care – PPO | Source: Ambulatory Visit | Attending: Cardiology | Admitting: Cardiology

## 2022-09-28 DIAGNOSIS — I2510 Atherosclerotic heart disease of native coronary artery without angina pectoris: Secondary | ICD-10-CM | POA: Diagnosis present

## 2022-09-28 DIAGNOSIS — I429 Cardiomyopathy, unspecified: Secondary | ICD-10-CM | POA: Insufficient documentation

## 2022-09-28 MED ORDER — IOHEXOL 350 MG/ML SOLN
100.0000 mL | Freq: Once | INTRAVENOUS | Status: AC | PRN
  Administered 2022-09-28: 100 mL via INTRAVENOUS

## 2022-09-28 MED ORDER — NITROGLYCERIN 0.4 MG SL SUBL
0.8000 mg | SUBLINGUAL_TABLET | Freq: Once | SUBLINGUAL | Status: AC
  Administered 2022-09-28: 0.8 mg via SUBLINGUAL

## 2022-09-28 MED ORDER — NITROGLYCERIN 0.4 MG SL SUBL
SUBLINGUAL_TABLET | SUBLINGUAL | Status: AC
  Filled 2022-09-28: qty 2

## 2022-09-28 NOTE — Telephone Encounter (Signed)
Patient is requesting a call back with her CT results. Also dropped off another Short Term Disability form to be filled out and faxed to the number listed on form (bringing form with me to [place] this afternoon and scanning into documents in patients chart) Let patient know Dr. Irene Pap will be back in the office on Friday in [place]. CB #  [phone]

## 2022-09-29 ENCOUNTER — Encounter (HOSPITAL_COMMUNITY): Payer: Self-pay

## 2022-09-29 ENCOUNTER — Emergency Department (HOSPITAL_COMMUNITY): Payer: BC Managed Care – PPO

## 2022-09-29 ENCOUNTER — Emergency Department (HOSPITAL_COMMUNITY)
Admission: EM | Admit: 2022-09-29 | Discharge: 2022-09-29 | Disposition: A | Discharge status: Home / Self Care | Payer: BC Managed Care – PPO | Attending: Emergency Medicine | Admitting: Emergency Medicine

## 2022-09-29 DIAGNOSIS — R079 Chest pain, unspecified: Secondary | ICD-10-CM

## 2022-09-29 DIAGNOSIS — J449 Chronic obstructive pulmonary disease, unspecified: Secondary | ICD-10-CM | POA: Insufficient documentation

## 2022-09-29 DIAGNOSIS — I2510 Atherosclerotic heart disease of native coronary artery without angina pectoris: Secondary | ICD-10-CM | POA: Insufficient documentation

## 2022-09-29 DIAGNOSIS — R0789 Other chest pain: Secondary | ICD-10-CM | POA: Insufficient documentation

## 2022-09-29 DIAGNOSIS — Z7982 Long term (current) use of aspirin: Secondary | ICD-10-CM | POA: Diagnosis not present

## 2022-09-29 LAB — BASIC METABOLIC PANEL
Anion gap: 7 (ref 5–15)
BUN: 13 mg/dL (ref 8–23)
CO2: 33 mmol/L — ABNORMAL HIGH (ref 22–32)
Calcium: 8.3 mg/dL — ABNORMAL LOW (ref 8.9–10.3)
Chloride: 97 mmol/L — ABNORMAL LOW (ref 98–111)
Creatinine, Ser: 0.68 mg/dL (ref 0.44–1.00)
GFR, Estimated: >60 mL/min (ref >60)
Glucose, Bld: 99 mg/dL (ref 70–99)
Potassium: 3.6 mmol/L (ref 3.5–5.1)
Sodium: 137 mmol/L (ref 135–145)

## 2022-09-29 LAB — CBC WITH DIFFERENTIAL/PLATELET
Abs Immature Granulocytes: 0.02 10*3/uL (ref 0.00–0.07)
Basophils Absolute: 0 10*3/uL (ref 0.0–0.1)
Basophils Relative: 1 %
Eosinophils Absolute: 0.2 10*3/uL (ref 0.0–0.5)
Eosinophils Relative: 4 %
HCT: 35.2 % — ABNORMAL LOW (ref 36.0–46.0)
Hemoglobin: 10.5 g/dL — ABNORMAL LOW (ref 12.0–15.0)
Immature Granulocytes: 0 %
Lymphocytes Relative: 32 %
Lymphs Abs: 1.8 10*3/uL (ref 0.7–4.0)
MCH: 27.9 pg (ref 26.0–34.0)
MCHC: 29.8 g/dL — ABNORMAL LOW (ref 30.0–36.0)
MCV: 93.6 fL (ref 80.0–100.0)
Monocytes Absolute: 0.5 10*3/uL (ref 0.1–1.0)
Monocytes Relative: 9 %
Neutro Abs: 3 10*3/uL (ref 1.7–7.7)
Neutrophils Relative %: 54 %
Platelets: 213 10*3/uL (ref 150–400)
RBC: 3.76 MIL/uL — ABNORMAL LOW (ref 3.87–5.11)
RDW: 16.7 % — ABNORMAL HIGH (ref 11.5–15.5)
WBC: 5.5 10*3/uL (ref 4.0–10.5)
nRBC: 0 % (ref 0.0–0.2)

## 2022-09-29 LAB — HEPATIC FUNCTION PANEL
ALT: 14 U/L (ref 0–44)
AST: 14 U/L — ABNORMAL LOW (ref 15–41)
Albumin: 3.1 g/dL — ABNORMAL LOW (ref 3.5–5.0)
Alkaline Phosphatase: 49 U/L (ref 38–126)
Bilirubin, Direct: 0.1 mg/dL (ref 0.0–0.2)
Indirect Bilirubin: 0.2 mg/dL — ABNORMAL LOW (ref 0.3–0.9)
Total Bilirubin: 0.3 mg/dL (ref 0.3–1.2)
Total Protein: 5.7 g/dL — ABNORMAL LOW (ref 6.5–8.1)

## 2022-09-29 LAB — TROPONIN I (HIGH SENSITIVITY)
Troponin I (High Sensitivity): 3 ng/L (ref <18)
Troponin I (High Sensitivity): 4 ng/L (ref <18)

## 2022-09-29 LAB — LIPASE, BLOOD: Lipase: 31 U/L (ref 11–51)

## 2022-09-29 MED ORDER — FENTANYL CITRATE PF 50 MCG/ML IJ SOSY
50.0000 ug | PREFILLED_SYRINGE | Freq: Once | INTRAMUSCULAR | Status: AC
  Administered 2022-09-29: 50 ug via INTRAVENOUS
  Filled 2022-09-29: qty 1

## 2022-09-29 NOTE — ED Triage Notes (Signed)
Pt BIBA from home. EMS received call at 1010. Pt started havin substernal chest pain to right flank- felt similar to past heart attacks. Pt had EKG change originally, with elevation.  Chest pain has subsided, EKG back to baseline.  Pt received 5 albuterol en route. Pt had 324 ASA. No nitro.  RA 70% on EMS arrival, pt wears 5L [place] at all times at home.  18 L AC  116/80 HR 70s 100% 5L

## 2022-09-29 NOTE — ED Notes (Addendum)
Got patient into a gown on the monitor did EKG shown to Dr Lockie Mola patient is resting with call bel in reach and got patient some warm blankets

## 2022-09-29 NOTE — ED Provider Notes (Signed)
[place] EMERGENCY DEPARTMENT AT [facility] Provider Note   CSN: [id number] Arrival date & time: 09/29/22  1054     History  Chief Complaint  Patient presents with   Chest Pain    [name] is a 62 y.o. female.  Chest discomfort prior to arrival.  Felt the chest pain prior to coming here.  History of COPD on 5 L of oxygen, polysubstance abuse history.  Denies any alcohol or drug use recently.  History of acid reflux, high cholesterol.  Denies any recent viral infections or illnesses.  Denies any abdominal pain nausea vomiting.  Pain is feeling much better.  Patient denies any weakness or numbness or tingling.  The history is provided by the patient.       Home Medications Prior to Admission medications   Medication Sig Start Date End Date Taking? Authorizing Provider  albuterol (PROVENTIL) (2.5 MG/3ML) 0.083% nebulizer solution Take 3 mLs (2.5 mg total) by nebulization every 4 (four) hours as needed for wheezing or shortness of breath. 08/31/22   Omar Person, MD  albuterol (VENTOLIN HFA) 108 (90 Base) MCG/ACT inhaler Inhale 2 puffs into the lungs every 4 (four) hours as needed for wheezing or shortness of breath. 04/25/22   Lewie Chamber, MD  aspirin EC 81 MG tablet Take 1 tablet (81 mg total) by mouth daily. Swallow whole. 09/01/22   Revankar, Aundra Dubin, MD  Budeson-Glycopyrrol-Formoterol (BREZTRI AEROSPHERE) 160-9-4.8 MCG/ACT AERO Inhale 2 puffs into the lungs in the morning and at bedtime. 08/31/22   Omar Person, MD  Evolocumab (REPATHA SURECLICK) 140 MG/ML SOAJ Inject 140 mg into the skin every 14 (fourteen) days.    [provider]  famotidine (PEPCID) 20 MG tablet Take 1 tablet (20 mg total) by mouth daily. Must keep scheduled appointment for future refills 08/15/22   Baldo Daub, MD  metoprolol tartrate (LOPRESSOR) 100 MG tablet Take 1 tablet (100 mg total) by mouth once for 1 dose. Take 2 hours prior to your CT if your heart rate is  greater than 55 09/20/22 09/20/22  Revankar, Aundra Dubin, MD  OXYGEN Inhale 6 L into the lungs continuous.    [provider]      Allergies    Amoxicillin, Tylenol [acetaminophen], Statins, and Zetia [ezetimibe]    Review of Systems   Review of Systems  Physical Exam Updated Vital Signs BP 105/80   Pulse 70   Temp 97.9 F (36.6 C)   Resp 19   Ht 5\' 3"  (1.6 m)   Wt 66.2 kg   SpO2 100%   BMI 25.86 kg/m  Physical Exam Vitals and nursing note reviewed.  Constitutional:      General: She is not in acute distress.    Appearance: She is well-developed. She is not ill-appearing.  HENT:     Head: Normocephalic and atraumatic.  Eyes:     Extraocular Movements: Extraocular movements intact.     Conjunctiva/sclera: Conjunctivae normal.     Pupils: Pupils are equal, round, and reactive to light.  Cardiovascular:     Rate and Rhythm: Normal rate and regular rhythm.     Pulses:          Radial pulses are 2+ on the right side and 2+ on the left side.     Heart sounds: No murmur heard. Pulmonary:     Effort: Pulmonary effort is normal. No respiratory distress.     Breath sounds: Wheezing (faint end expiratory wheeze) present. No decreased  breath sounds.  Abdominal:     Palpations: Abdomen is soft.     Tenderness: There is no abdominal tenderness.  Musculoskeletal:        General: No swelling.     Cervical back: Normal range of motion and neck supple.  Skin:    General: Skin is warm and dry.     Capillary Refill: Capillary refill takes less than 2 seconds.  Neurological:     General: No focal deficit present.     Mental Status: She is alert.  Psychiatric:        Mood and Affect: Mood normal.     ED Results / Procedures / Treatments   Labs (all labs ordered are listed, but only abnormal results are displayed) Labs Reviewed  CBC WITH DIFFERENTIAL/PLATELET - Abnormal; Notable for the following components:      Result Value   RBC 3.76 (*)    Hemoglobin 10.5 (*)    HCT  35.2 (*)    MCHC 29.8 (*)    RDW 16.7 (*)    All other components within normal limits  BASIC METABOLIC PANEL - Abnormal; Notable for the following components:   Chloride 97 (*)    CO2 33 (*)    Calcium 8.3 (*)    All other components within normal limits  HEPATIC FUNCTION PANEL - Abnormal; Notable for the following components:   Total Protein 5.7 (*)    Albumin 3.1 (*)    AST 14 (*)    Indirect Bilirubin 0.2 (*)    All other components within normal limits  LIPASE, BLOOD  TROPONIN I (HIGH SENSITIVITY)  TROPONIN I (HIGH SENSITIVITY)    EKG EKG Interpretation  Date/Time:  Thursday Sep 29 2022 11:41:33 EDT Ventricular Rate:  79 PR Interval:  141 QRS Duration: 81 QT Interval:  379 QTC Calculation: 435 R Axis:   84 Text Interpretation: Sinus rhythm Borderline right axis deviation Confirmed by Virgina Norfolk ([id number]) on 09/29/2022 12:19:20 PM  Radiology DG Chest Portable 1 View  Result Date: 09/29/2022 CLINICAL DATA:  Chest pain EXAM: PORTABLE CHEST 1 VIEW COMPARISON:  09/09/2022 FINDINGS: Cardiac and mediastinal contours are within normal limits given AP technique. Linear opacities in the lung bases, likely atelectasis and/or scarring. No new focal pulmonary opacity. No pleural effusion or pneumothorax. No acute osseous abnormality. IMPRESSION: No acute cardiopulmonary process. Electronically Signed   By: Wiliam Ke M.D.   On: 09/29/2022 11:40   CT CORONARY MORPH W/CTA COR W/SCORE W/CA W/CM &/OR WO/CM  Result Date: 09/28/2022 CLINICAL DATA:  Chest pain EXAM: Cardiac/Coronary CTA TECHNIQUE: A non-contrast, gated CT scan was obtained with axial slices of 3 mm through the heart for calcium scoring. Calcium scoring was performed using the Agatston method. A 120 kV prospective, gated, contrast cardiac scan was obtained. Gantry rotation speed was 250 msecs and collimation was 0.6 mm. Two sublingual nitroglycerin tablets (0.8 mg) were given. The 3D data set was reconstructed in 5%  intervals of the 35-75% of the R-R cycle. Diastolic phases were analyzed on a dedicated workstation using MPR, MIP, and VRT modes. The patient received 95 cc of contrast. FINDINGS: Image quality: Excellent. Noise artifact is: Limited. Coronary Arteries:  Normal coronary origin.  Right dominance. Left main: The left main is a large caliber vessel with a normal take off from the left coronary cusp that trifurcates into a LAD, LCX, and ramus intermedius. There is minimal calcified plaque (<25%). Left anterior descending artery: The LAD contains minimal mixed density plaque (<25%).  Distal LAD myocardial bridge (normal variant). The LAD gives off 2 patent diagonal branches. Ramus intermedius: Patent with no evidence of plaque or stenosis. Left circumflex artery: The LCX is non-dominant. There is minimal mixed density plaque (<25%). The LCX gives off 2 patent obtuse marginal branches. Right coronary artery: The RCA is dominant with normal take off from the right coronary cusp. There is minimal mixed density plaque (<25%). The RCA terminates as a PDA and right posterolateral branch without evidence of plaque or stenosis. Right Atrium: Right atrial size is within normal limits. Right Ventricle: The right ventricular cavity is within normal limits. Left Atrium: Left atrial size is normal in size with no left atrial appendage filling defect. Left Ventricle: The ventricular cavity size is within normal limits. Pulmonary arteries: Normal in size. Pulmonary veins: Normal pulmonary venous drainage. Pericardium: Normal thickness without significant effusion or calcium present. Cardiac valves: The aortic valve is trileaflet without significant calcification. The mitral valve is normal without significant calcification. Aorta: Normal caliber without significant disease. Extra-cardiac findings: See attached radiology report for non-cardiac structures. IMPRESSION: 1. Coronary calcium score of 138. This was 98th percentile for age-,  sex, and race-matched controls. 2. Total plaque volume 119 mm3 which is 54th percentile for age- and sex-matched controls (calcified plaque 16 mm3; non-calcified plaque 103 mm3). TPV is moderate. 3. Normal coronary origin with right dominance. 4. Minimal CAD (<25%). RECOMMENDATIONS: 1. Minimal non-obstructive CAD (0-24%). Consider non-atherosclerotic causes of chest pain. Consider preventive therapy and risk factor modification. Lennie Odor, MD Electronically Signed   By: Lennie Odor M.D.   On: 09/28/2022 10:38    Procedures Procedures    Medications Ordered in ED Medications  fentaNYL (SUBLIMAZE) injection 50 mcg (50 mcg Intravenous Given 09/29/22 1117)    ED Course/ Medical Decision Making/ A&P                             Medical Decision Making Amount and/or Complexity of Data Reviewed Labs: ordered. Radiology: ordered.  Risk Prescription drug management.   [name] is here with chest pain.  Normal vitals.  No fever.  History of polysubstance abuse, CAD, acid reflux, COPD on 5 L of oxygen.  Patient with chest discomfort that started this morning.  Feeling better now.  Denies any shortness of breath or cough or sputum production as needed.  EKG shows sinus rhythm.  No obvious ischemic changes.  Was made a code STEMI in the field but was canceled by cardiology prior to patient arriving here.  She feels like maybe it was gas discomfort.  She denies any recent drug use.  Differential diagnosis possibly GI related versus less likely ACS, COPD.  She actually had a CT coronary study done yesterday that per my review and interpretation of the report pretty unremarkable.  Fairly minimal disease.  Will talk with cardiology team and get labs including CBC, BMP, troponin, chest x-ray.  Her lungs sound fairly clear.  I do not think this is a lung process.  She is feeling much better.  Per my review and interpretation labs no significant anemia or electrolyte abnormality or kidney injury or  leukocytosis.  Chest x-ray with no signs of volume overload or pneumonia.  EKG shows sinus rhythm.  No ischemic changes.  Troponin negative x 2.  Talked with Dr. Lenise Herald cardiology.  He reviewed the CT coronary study from yesterday which is very reassuring.  She is pain-free now.  Overall suspect that  this was likely reflux related or GI related discomfort.  Patient discharged in good condition.  This chart was dictated using voice recognition software.  Despite best efforts to proofread,  errors can occur which can change the documentation meaning.         Final Clinical Impression(s) / ED Diagnoses Final diagnoses:  Nonspecific chest pain    Rx / DC Orders ED Discharge Orders     None         Virgina Norfolk, DO 09/29/22 1434

## 2022-09-30 ENCOUNTER — Telehealth: Payer: Self-pay | Admitting: Cardiology

## 2022-09-30 ENCOUNTER — Telehealth: Payer: Self-pay | Admitting: Student

## 2022-09-30 NOTE — Telephone Encounter (Signed)
Awaiting on Dr. Tomie China to review CT and advise on return to work date.

## 2022-09-30 NOTE — Telephone Encounter (Signed)
Patient is following up on the status of her Short Term Disability paperwork and when she can pick it up.

## 2022-09-30 NOTE — Telephone Encounter (Signed)
Spoke with pt and advised she can return to work on 10/03/22. Letter sanned to email for pt to print as we are in the [place] office today. Pt aware this is clearance from cardiology only.

## 2022-09-30 NOTE — Telephone Encounter (Signed)
Patient is calling to follow up on paperwork that she recently turned in.

## 2022-09-30 NOTE — Telephone Encounter (Signed)
Dr. Meier can you please advise? 

## 2022-10-03 ENCOUNTER — Telehealth: Payer: Self-pay | Admitting: Cardiology

## 2022-10-03 NOTE — Telephone Encounter (Signed)
ATC X1 LVM for patient to call the office back. Please schedule OV with either Dr. Thora Lance, Dr. Isaiah Serge or and APP

## 2022-10-03 NOTE — Telephone Encounter (Signed)
Patient states she got a return to work letter from [place].  She states the letter states she can return to work with no restrictions. She states she only needs the letter stating that she can return to work it doesn't need to say anything else. She states her pulmonary doctor has her only working 8hr.

## 2022-10-03 NOTE — Telephone Encounter (Signed)
Advised for cardiology she is cleared with no restrictions. Pulmonary is another issue and they will continue with her note and restrictions as they feels is needed. Pt verbalized understanding and had no additional questions.

## 2022-10-03 NOTE — Telephone Encounter (Signed)
Would she be willing to come in for office visit to discuss? Had recent admission, not sure what kind of work she does since she isn't a patient that I usually follow in clinic and whether or not they're willing to let her work with O2 tanks or POC, not sure what her current O2 requirement is after recent admission. She also seemed to have some reservations about ideal technique with her inhaler and neb regimen.   Would schedule with PM, myself, or APP  Thanks!

## 2022-10-04 ENCOUNTER — Ambulatory Visit: Payer: BC Managed Care – PPO | Admitting: Physical Therapy

## 2022-10-06 ENCOUNTER — Ambulatory Visit: Payer: BC Managed Care – PPO | Admitting: Physical Therapy

## 2022-10-07 NOTE — Telephone Encounter (Signed)
Received a from from [employer] on 4/24 for additional information to continue patient's FMLA.  Form was never completed and returned.  Patient recently contacted this office to get clearance to return to work following her 4/26-27 hospitalization.  She was back in the emergency room on 5/16.  Her cardiologist has generated a letter stating she can return to work, but she needs one from pulmonary.  She was last seen in office on 4/17 by Dr. Thora Lance and she needs to be seen again in order to determine whether she can go back to work.  She has COPD and is on 5-6 lpm o2 continuous.   Multiple attempts to call patient have been unsuccessful because her voice mail is full and we are unable to leave a message.  A MyChart message was sent to her on 5/20 by nurse Vernie Murders.  Patient needs to make an appointment with Dr. Isaiah Serge, Dr. Thora Lance, or an APP.

## 2022-10-11 ENCOUNTER — Telehealth: Payer: Self-pay | Admitting: Pulmonary Disease

## 2022-10-11 NOTE — Telephone Encounter (Signed)
Pt called in stating she will like to switch providers due to providers schedule not being avail when she needs. Pls advise pt on new provider recommendation

## 2022-10-12 NOTE — Telephone Encounter (Signed)
Dr. Isaiah Serge please advise if your okay with patient switching providers

## 2022-10-17 NOTE — Telephone Encounter (Signed)
ATC X1 LVM for patient to call the office back. Please schedule patient with any other provider

## 2022-10-17 NOTE — Telephone Encounter (Addendum)
FMLA form that was received from [employer] on 4/24 was to get documentation for continuous FMLA.  Intermittent FMLA has been approved until 12/14/22.    Patient has returned to work full time, so it is not necessary to complete this continuous FMLA request.  Patient has appointment with Dr. Isaiah Serge on 11/25/22

## 2022-10-17 NOTE — Telephone Encounter (Signed)
I am okay with switching providers.  She can see any of the other doctors

## 2022-11-05 ENCOUNTER — Other Ambulatory Visit: Payer: Self-pay | Admitting: Cardiology

## 2022-11-23 ENCOUNTER — Encounter: Payer: Self-pay | Admitting: Pulmonary Disease

## 2022-11-23 ENCOUNTER — Encounter: Payer: Self-pay | Admitting: *Deleted

## 2022-11-23 ENCOUNTER — Ambulatory Visit: Payer: BC Managed Care – PPO | Admitting: Pulmonary Disease

## 2022-11-23 VITALS — BP 112/64 | HR 70 | Temp 98.3°F | Ht 63.0 in | Wt 146.8 lb

## 2022-11-23 DIAGNOSIS — J449 Chronic obstructive pulmonary disease, unspecified: Secondary | ICD-10-CM

## 2022-11-23 DIAGNOSIS — J441 Chronic obstructive pulmonary disease with (acute) exacerbation: Secondary | ICD-10-CM

## 2022-11-23 MED ORDER — METHYLPREDNISOLONE ACETATE 80 MG/ML IJ SUSP
120.0000 mg | Freq: Once | INTRAMUSCULAR | Status: AC
Start: 2022-11-23 — End: 2022-11-23
  Administered 2022-11-23: 120 mg via INTRAMUSCULAR

## 2022-11-23 MED ORDER — PREDNISONE 10 MG PO TABS: ORAL_TABLET | ORAL | 2 refills | Status: DC

## 2022-11-23 MED ORDER — BREZTRI AEROSPHERE 160-9-4.8 MCG/ACT IN AERO: 2.0000 | INHALATION_SPRAY | Freq: Two times a day (BID) | RESPIRATORY_TRACT | 0 refills | Status: DC

## 2022-11-23 MED ORDER — PREDNISONE 20 MG PO TABS: ORAL_TABLET | ORAL | 0 refills | Status: DC

## 2022-11-23 NOTE — Patient Instructions (Signed)
Give Depo injection today and prednisone 40 mg a day for 5 days.  At the end of 5 days start on prednisone at 10 mg/day Start Daliresp to reduce frequency of exacerbations Referral to pulmonary rehab Follow-up in 1 to 2 months with me or APP

## 2022-11-23 NOTE — Addendum Note (Signed)
Addended by: Jacquiline Doe on: 11/23/2022 04:01 PM   Modules accepted: Orders

## 2022-11-23 NOTE — Progress Notes (Signed)
[name]    [id number]    August 22, 1960  Primary Care Physician:Osei-Bonsu, Greggory Stallion, MD  Referring Physician: Jackie Plum, MD [address],  [place] [address]  Chief complaint:  Follow-up for  Severe COPD  HPI: 62 year old smoker with very severe COPD, chronic hypoxic respiratory failure.  She is previously followed by Dr. Jamison Neighbor.  Pulmicort and Perforomist were ordered last year but insurance would not cover She was then placed on Symbicort in 2018 but had worsening symptoms of dyspnea, wheeze. Inhalers were switched to Trelegy at office visit in March 2019.    Continues to have chronic cough, dyspnea on exertion, intermittent hemoptysis with mucus Plavix has been held by Dr. Dulce Sellar, cardiology  She was treated for COPD exacerbation around late November 2022.  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 Exposures: No mold, hot tub, Jacuzzi. Smoking history: 35-pack-year smoker.  Quit in 2011 Travel history: No significant travel history Relevant family history: No significant family history of lung disease.  Interim history:    Outpatient Encounter Medications as of 11/23/2022  Medication Sig   albuterol (PROVENTIL) (2.5 MG/3ML) 0.083% nebulizer solution Take 3 mLs (2.5 mg total) by nebulization every 4 (four) hours as needed for wheezing or shortness of breath.   albuterol (VENTOLIN HFA) 108 (90 Base) MCG/ACT inhaler Inhale 2  puffs into the lungs every 4 (four) hours as needed for wheezing or shortness of breath.   Budeson-Glycopyrrol-Formoterol (BREZTRI AEROSPHERE) 160-9-4.8 MCG/ACT AERO Inhale 2 puffs into the lungs in the morning and at bedtime.   Evolocumab (REPATHA SURECLICK) 140 MG/ML SOAJ Inject 140 mg into the skin every 14 (fourteen) days.   famotidine (PEPCID) 20 MG tablet Take 1 tablet (20 mg total) by mouth daily.   OXYGEN Inhale 5 L into the lungs continuous.   [DISCONTINUED] aspirin EC 81 MG tablet Take 1 tablet (81 mg total) by mouth daily. Swallow whole.   [DISCONTINUED] metoprolol tartrate (LOPRESSOR) 100 MG tablet Take 1 tablet (100 mg total) by mouth once for 1 dose. Take 2 hours prior to your CT if your heart rate is greater than 55   No facility-administered encounter medications on file as of 11/23/2022.   Physical Exam: Blood pressure 118/64, pulse 89, temperature 98.9 F (37.2 C), temperature source Oral, height 5\' 3"  (1.6 m), weight 165 lb 9.6 oz (75.1 kg), SpO2 97 %. Gen:      No acute distress HEENT:  EOMI, sclera anicteric Neck:     No masses; no thyromegaly Lungs:    Clear to auscultation bilaterally; normal respiratory effort CV:         Regular rate and rhythm; no murmurs Abd:      + bowel sounds; soft, non-tender; no palpable masses, no distension Ext:    No edema; adequate peripheral perfusion Skin:      Warm and dry; no rash Neuro: alert and oriented x 3 Psych: normal mood and affect   Data  Reviewed: Imaging: Low-dose screening CT 04/19/2021-new areas of bilateral subpleural groundglass consolidation in the lower lobes, coronary artery calcification, enlarged PA, emphysema.  Screening CT 07/15/21 - new large consolidation right middle and right lower lobe  CT 11/01/2021-irregular focus of consolidation in the right middle lobe I have reviewed the images personally.  PFT 06/02/17 FVC 1.50 [60%], FEV1 0.79 [40%], F/F 53, TLC 96, RV/TLC 167%, DLCO 32% Severe obstruction with  bronchodilator response, air-trapping.  Severe diffusion impairment.  11/10/15:  Walked 360 meters / Baseline Sat 96% on RA / Nadir Sat 95% on RA  MICROBIOLOGY Sputum Ctx (11/13/15):  Paecilomyces species / Oral Flora / AFB negative    LABS 03/01/15 HIV:  Negative   09/29/14 ANA:  Negative   05/16/11 Alpha-1 antitrypsin: MM (122)  Cardiac work-up by Dr. Jacinto Halim Nuclear stress test 11/14/16-very small inferoseptal ischemia, preserved LVEF with mild hypokinesis the same region Echocardiogram shows mild pulmonary hypertension with normal LV systolic function.  Assessment:  Severe COPD with acute exacerbation, frequent exacerbations of COPD She continues to be symptomatic with wheeze and dyspnea Continue breztri, nebs as needed, continue supplemental oxygen Give Depo injection today and prednisone 40 mg a day for 5 days.  At the end of 5 days start on prednisone at 10 mg/day Start Daliresp to reduce frequency of exacerbations Once insurance approves Daliresp and she is stable on that we can try weaning her off the prednisone completely  Consider chronic azithromycin if she continues to have exacerbations  Referral to pulmonary rehab  Abnormal CT Continues to have waxing waning nodular opacities most recently seen on April 2024 Order follow-up CT in 6 months  Health maintenance Influenza vaccine-states that she got her flu vaccine this year at [facility]. 09/03/2011-Pneumovax  Plan/Recommendations: - Continue breztri - Continue supplemental oxygen - Daliresp, chronic prednisone - Pulmonary rehab -Follow-up CT chest  Chilton Greathouse MD [facility] 11/23/2022, 3:29 PM  CC: Jackie Plum, MD

## 2022-11-24 ENCOUNTER — Telehealth: Payer: Self-pay | Admitting: Pulmonary Disease

## 2022-11-24 NOTE — Telephone Encounter (Signed)
Pt calling in to get her Z-Pack sent over to her Pharmacy. Pt states she was prescribed the Z-pack yesterday at her appt.  [facility] on [address]

## 2022-11-24 NOTE — Telephone Encounter (Signed)
Pt calling in to get her Z-Pack sent over to her Pharmacy. Pt states she was prescribed the Z-pack yesterday at her appt.  [facility] on [address] 

## 2022-11-25 NOTE — Telephone Encounter (Signed)
Spoke with patient regarding prior message. Patient stated she was supposed to get a z-pack sent into her pharmacy. I dont see on her after visit does it say anything about a z-pack sent to her pharmacy .  Dr.Mannam can you please advise   Thank you

## 2022-12-01 ENCOUNTER — Telehealth: Payer: Self-pay | Admitting: Cardiology

## 2022-12-01 DIAGNOSIS — Z789 Other specified health status: Secondary | ICD-10-CM

## 2022-12-01 DIAGNOSIS — E7800 Pure hypercholesterolemia, unspecified: Secondary | ICD-10-CM

## 2022-12-01 DIAGNOSIS — E782 Mixed hyperlipidemia: Secondary | ICD-10-CM

## 2022-12-01 DIAGNOSIS — I2510 Atherosclerotic heart disease of native coronary artery without angina pectoris: Secondary | ICD-10-CM

## 2022-12-01 DIAGNOSIS — I700 Atherosclerosis of aorta: Secondary | ICD-10-CM

## 2022-12-01 DIAGNOSIS — G720 Drug-induced myopathy: Secondary | ICD-10-CM

## 2022-12-01 NOTE — Telephone Encounter (Signed)
Spoke with pt who states that Repatha is no longer covered on her insurance but states that Pralulent is covered. Requesting to switch. Please advise.

## 2022-12-01 NOTE — Telephone Encounter (Signed)
Pt c/o medication issue:  1. Name of Medication: Evolocumab (REPATHA SURECLICK) 140 MG/ML SOAJ   2. How are you currently taking this medication (dosage and times per day)?    3. Are you having a reaction (difficulty breathing--STAT)? no  4. What is your medication issue? Patient states that her insurance is no longer cover ing this medication. Calling to see what other optiosn are there. Please advise

## 2022-12-02 MED ORDER — PREDNISONE 20 MG PO TABS: ORAL_TABLET | ORAL | 0 refills | Status: DC

## 2022-12-02 NOTE — Telephone Encounter (Signed)
I called and spoke with the patient. She got a z pack from urgent care that she started a few days ago and finished the prednisone but is still not feeling well. I will call in an additional round of prednisone 40 mg x 5 days

## 2022-12-05 ENCOUNTER — Other Ambulatory Visit (HOSPITAL_COMMUNITY): Payer: Self-pay

## 2022-12-05 MED ORDER — PRALUENT 150 MG/ML ~~LOC~~ SOAJ
150.0000 mg | SUBCUTANEOUS | 2 refills | Status: DC
Start: 2022-12-05 — End: 2023-09-01

## 2022-12-05 NOTE — Telephone Encounter (Signed)
Recommend restarting Praluent 150mg , 1ml sq q 2 weeks

## 2022-12-05 NOTE — Telephone Encounter (Signed)
Pharmacy Patient Advocate Encounter   Received notification from Pt Calls Messages that prior authorization for Praluent 150 mg/ml Soaj is required/requested.   Insurance verification completed.   The patient is insured through [facility] Medicare Part D.   Per test claim: The current 30 day co-pay is, $11.20.  No PA needed at this time.

## 2022-12-05 NOTE — Telephone Encounter (Signed)
Pt aware.

## 2022-12-05 NOTE — Telephone Encounter (Signed)
Pt needs to switch from Repatha to Praluent due to insurance. What dosage?  Thank you, Ladonna Snide

## 2022-12-05 NOTE — Telephone Encounter (Signed)
Please complete a PA. Thank you Ladonna Snide

## 2022-12-05 NOTE — Addendum Note (Signed)
Addended by: Eleonore Chiquito on: 12/05/2022 03:08 PM   Modules accepted: Orders

## 2022-12-08 DIAGNOSIS — H6122 Impacted cerumen, left ear: Secondary | ICD-10-CM | POA: Insufficient documentation

## 2022-12-08 HISTORY — DX: Impacted cerumen, left ear: H61.22

## 2022-12-12 ENCOUNTER — Inpatient Hospital Stay (HOSPITAL_COMMUNITY)
Admission: EM | Admit: 2022-12-12 | Discharge: 2022-12-14 | DRG: 190 | Disposition: A | Discharge status: Home / Self Care | Payer: BC Managed Care – PPO | Attending: Internal Medicine | Admitting: Internal Medicine

## 2022-12-12 ENCOUNTER — Emergency Department (HOSPITAL_COMMUNITY): Payer: BC Managed Care – PPO

## 2022-12-12 ENCOUNTER — Encounter (HOSPITAL_COMMUNITY): Payer: Self-pay

## 2022-12-12 ENCOUNTER — Other Ambulatory Visit: Payer: Self-pay

## 2022-12-12 DIAGNOSIS — Z888 Allergy status to other drugs, medicaments and biological substances status: Secondary | ICD-10-CM | POA: Diagnosis not present

## 2022-12-12 DIAGNOSIS — Z8616 Personal history of COVID-19: Secondary | ICD-10-CM | POA: Diagnosis not present

## 2022-12-12 DIAGNOSIS — Z825 Family history of asthma and other chronic lower respiratory diseases: Secondary | ICD-10-CM | POA: Diagnosis not present

## 2022-12-12 DIAGNOSIS — Z880 Allergy status to penicillin: Secondary | ICD-10-CM | POA: Diagnosis not present

## 2022-12-12 DIAGNOSIS — Z1152 Encounter for screening for COVID-19: Secondary | ICD-10-CM | POA: Diagnosis not present

## 2022-12-12 DIAGNOSIS — Z79899 Other long term (current) drug therapy: Secondary | ICD-10-CM

## 2022-12-12 DIAGNOSIS — Z87891 Personal history of nicotine dependence: Secondary | ICD-10-CM | POA: Diagnosis not present

## 2022-12-12 DIAGNOSIS — Z9981 Dependence on supplemental oxygen: Secondary | ICD-10-CM

## 2022-12-12 DIAGNOSIS — J9621 Acute and chronic respiratory failure with hypoxia: Secondary | ICD-10-CM | POA: Diagnosis not present

## 2022-12-12 DIAGNOSIS — J439 Emphysema, unspecified: Secondary | ICD-10-CM | POA: Diagnosis present

## 2022-12-12 DIAGNOSIS — I10 Essential (primary) hypertension: Secondary | ICD-10-CM | POA: Diagnosis present

## 2022-12-12 DIAGNOSIS — J302 Other seasonal allergic rhinitis: Secondary | ICD-10-CM | POA: Diagnosis present

## 2022-12-12 DIAGNOSIS — E785 Hyperlipidemia, unspecified: Secondary | ICD-10-CM | POA: Diagnosis present

## 2022-12-12 DIAGNOSIS — J9600 Acute respiratory failure, unspecified whether with hypoxia or hypercapnia: Secondary | ICD-10-CM | POA: Diagnosis not present

## 2022-12-12 DIAGNOSIS — Z7952 Long term (current) use of systemic steroids: Secondary | ICD-10-CM

## 2022-12-12 DIAGNOSIS — Z8249 Family history of ischemic heart disease and other diseases of the circulatory system: Secondary | ICD-10-CM

## 2022-12-12 DIAGNOSIS — Z7951 Long term (current) use of inhaled steroids: Secondary | ICD-10-CM | POA: Diagnosis not present

## 2022-12-12 DIAGNOSIS — H9190 Unspecified hearing loss, unspecified ear: Secondary | ICD-10-CM | POA: Diagnosis present

## 2022-12-12 DIAGNOSIS — J441 Chronic obstructive pulmonary disease with (acute) exacerbation: Secondary | ICD-10-CM | POA: Diagnosis not present

## 2022-12-12 DIAGNOSIS — K219 Gastro-esophageal reflux disease without esophagitis: Secondary | ICD-10-CM | POA: Diagnosis present

## 2022-12-12 DIAGNOSIS — I2510 Atherosclerotic heart disease of native coronary artery without angina pectoris: Secondary | ICD-10-CM | POA: Diagnosis present

## 2022-12-12 HISTORY — DX: Acute and chronic respiratory failure with hypoxia: J96.21

## 2022-12-12 LAB — CBC WITH DIFFERENTIAL/PLATELET
Abs Immature Granulocytes: 0.02 10*3/uL (ref 0.00–0.07)
Basophils Absolute: 0 10*3/uL (ref 0.0–0.1)
Basophils Relative: 0 %
Eosinophils Absolute: 0.1 10*3/uL (ref 0.0–0.5)
Eosinophils Relative: 1 %
HCT: 45.9 % (ref 36.0–46.0)
Hemoglobin: 13.9 g/dL (ref 12.0–15.0)
Immature Granulocytes: 0 %
Lymphocytes Relative: 13 %
Lymphs Abs: 1.2 10*3/uL (ref 0.7–4.0)
MCH: 29.2 pg (ref 26.0–34.0)
MCHC: 30.3 g/dL (ref 30.0–36.0)
MCV: 96.4 fL (ref 80.0–100.0)
Monocytes Absolute: 0.8 10*3/uL (ref 0.1–1.0)
Monocytes Relative: 9 %
Neutro Abs: 7 10*3/uL (ref 1.7–7.7)
Neutrophils Relative %: 77 %
Platelets: 256 10*3/uL (ref 150–400)
RBC: 4.76 MIL/uL (ref 3.87–5.11)
RDW: 14.5 % (ref 11.5–15.5)
WBC: 9.2 10*3/uL (ref 4.0–10.5)
nRBC: 0 % (ref 0.0–0.2)

## 2022-12-12 LAB — COMPREHENSIVE METABOLIC PANEL
ALT: 16 U/L (ref 0–44)
AST: 19 U/L (ref 15–41)
Albumin: 4.3 g/dL (ref 3.5–5.0)
Alkaline Phosphatase: 66 U/L (ref 38–126)
Anion gap: 10 (ref 5–15)
BUN: 15 mg/dL (ref 8–23)
CO2: 29 mmol/L (ref 22–32)
Calcium: 9.2 mg/dL (ref 8.9–10.3)
Chloride: 97 mmol/L — ABNORMAL LOW (ref 98–111)
Creatinine, Ser: 0.76 mg/dL (ref 0.44–1.00)
GFR, Estimated: >60 mL/min (ref >60)
Glucose, Bld: 104 mg/dL — ABNORMAL HIGH (ref 70–99)
Potassium: 4.2 mmol/L (ref 3.5–5.1)
Sodium: 136 mmol/L (ref 135–145)
Total Bilirubin: 0.4 mg/dL (ref 0.3–1.2)
Total Protein: 7.9 g/dL (ref 6.5–8.1)

## 2022-12-12 LAB — BLOOD GAS, ARTERIAL
Acid-Base Excess: 3.7 mmol/L — ABNORMAL HIGH (ref 0.0–2.0)
Bicarbonate: 30.1 mmol/L — ABNORMAL HIGH (ref 20.0–28.0)
Delivery systems: POSITIVE
Expiratory PAP: 6 cmH2O
FIO2: 30 %
Inspiratory PAP: 16 cmH2O
O2 Saturation: 100 %
Patient temperature: 37
pCO2 arterial: 52 mmHg — ABNORMAL HIGH (ref 32–48)
pH, Arterial: 7.37 (ref 7.35–7.45)
pO2, Arterial: 143 mmHg — ABNORMAL HIGH (ref 83–108)

## 2022-12-12 LAB — BRAIN NATRIURETIC PEPTIDE: B Natriuretic Peptide: 30.1 pg/mL (ref 0.0–100.0)

## 2022-12-12 LAB — I-STAT CG4 LACTIC ACID, ED: Lactic Acid, Venous: 1.8 mmol/L (ref 0.5–1.9)

## 2022-12-12 LAB — TROPONIN I (HIGH SENSITIVITY): Troponin I (High Sensitivity): 4 ng/L (ref <18)

## 2022-12-12 MED ORDER — SODIUM CHLORIDE 0.9 % IV SOLN: INTRAVENOUS | Status: AC

## 2022-12-12 MED ORDER — METHYLPREDNISOLONE SODIUM SUCC 125 MG IJ SOLR
125.0000 mg | Freq: Once | INTRAMUSCULAR | Status: AC
  Administered 2022-12-12: 125 mg via INTRAVENOUS
  Filled 2022-12-12: qty 2

## 2022-12-12 MED ORDER — MAGNESIUM SULFATE 2 GM/50ML IV SOLN
2.0000 g | Freq: Once | INTRAVENOUS | Status: AC
  Administered 2022-12-12: 2 g via INTRAVENOUS
  Filled 2022-12-12: qty 50

## 2022-12-12 MED ORDER — ENOXAPARIN SODIUM 40 MG/0.4ML IJ SOSY
40.0000 mg | PREFILLED_SYRINGE | Freq: Every day | INTRAMUSCULAR | Status: DC
  Administered 2022-12-12 – 2022-12-13 (×2): 40 mg via SUBCUTANEOUS
  Filled 2022-12-12 (×2): qty 0.4

## 2022-12-12 MED ORDER — FAMOTIDINE 20 MG PO TABS
20.0000 mg | ORAL_TABLET | Freq: Every day | ORAL | Status: DC
  Administered 2022-12-13 – 2022-12-14 (×2): 20 mg via ORAL
  Filled 2022-12-12 (×2): qty 1

## 2022-12-12 MED ORDER — LEVOFLOXACIN IN D5W 750 MG/150ML IV SOLN
750.0000 mg | INTRAVENOUS | Status: DC
  Administered 2022-12-12: 750 mg via INTRAVENOUS
  Filled 2022-12-12: qty 150

## 2022-12-12 MED ORDER — BUDESON-GLYCOPYRROL-FORMOTEROL 160-9-4.8 MCG/ACT IN AERO: 2.0000 | INHALATION_SPRAY | Freq: Every day | RESPIRATORY_TRACT | Status: DC

## 2022-12-12 MED ORDER — ONDANSETRON HCL 4 MG PO TABS: 4.0000 mg | ORAL_TABLET | Freq: Four times a day (QID) | ORAL | Status: DC | PRN

## 2022-12-12 MED ORDER — ONDANSETRON HCL 4 MG/2ML IJ SOLN: 4.0000 mg | Freq: Four times a day (QID) | INTRAMUSCULAR | Status: DC | PRN

## 2022-12-12 MED ORDER — ALBUTEROL SULFATE (2.5 MG/3ML) 0.083% IN NEBU
10.0000 mg | INHALATION_SOLUTION | Freq: Once | RESPIRATORY_TRACT | Status: AC
  Administered 2022-12-12: 10 mg via RESPIRATORY_TRACT

## 2022-12-12 MED ORDER — ALBUTEROL SULFATE (2.5 MG/3ML) 0.083% IN NEBU: 2.5000 mg | INHALATION_SOLUTION | RESPIRATORY_TRACT | Status: DC | PRN

## 2022-12-12 MED ORDER — METHYLPREDNISOLONE SODIUM SUCC 40 MG IJ SOLR
40.0000 mg | Freq: Two times a day (BID) | INTRAMUSCULAR | Status: AC
  Administered 2022-12-12 – 2022-12-13 (×2): 40 mg via INTRAVENOUS
  Filled 2022-12-12 (×2): qty 1

## 2022-12-12 MED ORDER — LACTATED RINGERS IV BOLUS
500.0000 mL | Freq: Once | INTRAVENOUS | Status: AC
  Administered 2022-12-12: 500 mL via INTRAVENOUS

## 2022-12-12 MED ORDER — IBUPROFEN 200 MG PO TABS
600.0000 mg | ORAL_TABLET | Freq: Once | ORAL | Status: AC
  Administered 2022-12-12: 600 mg via ORAL
  Filled 2022-12-12: qty 3

## 2022-12-12 MED ORDER — IPRATROPIUM-ALBUTEROL 0.5-2.5 (3) MG/3ML IN SOLN
3.0000 mL | Freq: Four times a day (QID) | RESPIRATORY_TRACT | Status: DC
  Administered 2022-12-12 – 2022-12-14 (×7): 3 mL via RESPIRATORY_TRACT
  Filled 2022-12-12 (×7): qty 3

## 2022-12-12 MED ORDER — PREDNISONE 20 MG PO TABS: 40.0000 mg | ORAL_TABLET | Freq: Every day | ORAL | Status: DC

## 2022-12-12 MED ORDER — ALBUTEROL (5 MG/ML) CONTINUOUS INHALATION SOLN: 10.0000 mg/h | INHALATION_SOLUTION | Freq: Once | RESPIRATORY_TRACT | Status: DC

## 2022-12-12 MED ORDER — IPRATROPIUM BROMIDE 0.02 % IN SOLN
1.0000 mg | Freq: Once | RESPIRATORY_TRACT | Status: AC
  Administered 2022-12-12: 1 mg via RESPIRATORY_TRACT
  Filled 2022-12-12: qty 5

## 2022-12-12 NOTE — ED Provider Notes (Signed)
[place] EMERGENCY DEPARTMENT AT [facility] Provider Note   CSN: [id number] Arrival date & time: 12/12/22  1701     History  Chief Complaint  Patient presents with   COPD   Shortness of Breath    [name] is a 62 y.o. female.  HPI 62 year old female presents in respiratory distress.  History is somewhat limited due to this.  She is able to tell me that she had shortness of breath starting yesterday and that it acutely worsened about an hour prior to arrival.  She has had a cough with some phlegm.  No fevers or chest pain.  No leg swelling.  She had some wheezing and this new and worsening shortness of breath.  Feels like prior COPD.  Home Medications Prior to Admission medications   Medication Sig Start Date End Date Taking? Authorizing Provider  albuterol (PROVENTIL) (2.5 MG/3ML) 0.083% nebulizer solution Take 3 mLs (2.5 mg total) by nebulization every 4 (four) hours as needed for wheezing or shortness of breath. 08/31/22   Omar Person, MD  albuterol (VENTOLIN HFA) 108 (90 Base) MCG/ACT inhaler Inhale 2 puffs into the lungs every 4 (four) hours as needed for wheezing or shortness of breath. 04/25/22   Lewie Chamber, MD  Alirocumab (PRALUENT) 150 MG/ML SOAJ Inject 1 mL (150 mg total) into the skin every 14 (fourteen) days. 12/05/22   Revankar, Aundra Dubin, MD  Budeson-Glycopyrrol-Formoterol (BREZTRI AEROSPHERE) 160-9-4.8 MCG/ACT AERO Inhale 2 puffs into the lungs in the morning and at bedtime. 08/31/22   Omar Person, MD  Budeson-Glycopyrrol-Formoterol (BREZTRI AEROSPHERE) 160-9-4.8 MCG/ACT AERO Inhale 2 puffs into the lungs in the morning and at bedtime. 11/23/22   Mannam, Colbert Coyer, MD  famotidine (PEPCID) 20 MG tablet Take 1 tablet (20 mg total) by mouth daily. 11/07/22   Revankar, Aundra Dubin, MD  OXYGEN Inhale 5 L into the lungs continuous.    [provider]  predniSONE (DELTASONE) 10 MG tablet Start after finishing 40mg  day- take 10mg  daily 11/23/22    Mannam, Colbert Coyer, MD  predniSONE (DELTASONE) 20 MG tablet Take 40mg  for 5 days 12/02/22   Chilton Greathouse, MD      Allergies    Amoxicillin, Tylenol [acetaminophen], Statins, and Zetia [ezetimibe]    Review of Systems   Review of Systems  Unable to perform ROS: Severe respiratory distress    Physical Exam Updated Vital Signs BP 134/84   Pulse 78   Temp (!) 96.7 F (35.9 C) (Axillary)   Resp 20   Ht 5\' 3"  (1.6 m)   Wt 66 kg   SpO2 96%   BMI 25.77 kg/m  Physical Exam Vitals and nursing note reviewed.  Constitutional:      General: She is in acute distress.     Appearance: She is well-developed.  HENT:     Head: Normocephalic and atraumatic.  Cardiovascular:     Rate and Rhythm: Regular rhythm. Tachycardia present.     Heart sounds: Normal heart sounds.  Pulmonary:     Effort: Tachypnea, accessory muscle usage and respiratory distress present.     Breath sounds: Decreased breath sounds and wheezing present.  Abdominal:     General: There is no distension.  Musculoskeletal:     Right lower leg: No edema.     Left lower leg: No edema.  Skin:    General: Skin is warm and dry.  Neurological:     Mental Status: She is alert.     ED Results / Procedures /  Treatments   Labs (all labs ordered are listed, but only abnormal results are displayed) Labs Reviewed  BLOOD GAS, ARTERIAL - Abnormal; Notable for the following components:      Result Value   pCO2 arterial 52 (*)    pO2, Arterial 143 (*)    Bicarbonate 30.1 (*)    Acid-Base Excess 3.7 (*)    All other components within normal limits  COMPREHENSIVE METABOLIC PANEL - Abnormal; Notable for the following components:   Chloride 97 (*)    Glucose, Bld 104 (*)    All other components within normal limits  SARS CORONAVIRUS 2 BY RT PCR  CULTURE, BLOOD (ROUTINE X 2)  CULTURE, BLOOD (ROUTINE X 2)  BRAIN NATRIURETIC PEPTIDE  CBC WITH DIFFERENTIAL/PLATELET  CBC  BASIC METABOLIC PANEL  I-STAT CG4 LACTIC ACID, ED   I-STAT CG4 LACTIC ACID, ED  TROPONIN I (HIGH SENSITIVITY)  TROPONIN I (HIGH SENSITIVITY)    EKG EKG Interpretation Date/Time:  Monday December 12 2022 17:53:32 EDT Ventricular Rate:  90 PR Interval:  142 QRS Duration:  83 QT Interval:  391 QTC Calculation: 479 R Axis:   82  Text Interpretation: Sinus rhythm Borderline right axis deviation Nonspecific T abnrm, anterolateral leads  no significant change since May 2024 Confirmed by Pricilla Loveless [phone]) on 12/12/2022 5:56:11 PM  Radiology DG Chest Portable 1 View  Result Date: 12/12/2022 CLINICAL DATA:  respiratory distress EXAM: PORTABLE CHEST 1 VIEW COMPARISON:  09/29/2022. FINDINGS: Bilateral lung fields are clear. Bilateral costophrenic angles are clear. Normal cardio-mediastinal silhouette. No acute osseous abnormalities. The soft tissues are within normal limits. IMPRESSION: No active disease. Electronically Signed   By: Jules Schick M.D.   On: 12/12/2022 18:57    Procedures .Critical Care  Performed by: Pricilla Loveless, MD Authorized by: Pricilla Loveless, MD   Critical care provider statement:    Critical care time (minutes):  35   Critical care time was exclusive of:  Separately billable procedures and treating other patients   Critical care was necessary to treat or prevent imminent or life-threatening deterioration of the following conditions:  Respiratory failure   Critical care was time spent personally by me on the following activities:  Development of treatment plan with patient or surrogate, discussions with consultants, evaluation of patient's response to treatment, examination of patient, ordering and review of laboratory studies, ordering and review of radiographic studies, ordering and performing treatments and interventions, pulse oximetry, re-evaluation of patient's condition and review of old charts     Medications Ordered in ED Medications  famotidine (PEPCID) tablet 20 mg (has no administration in time range)   Budeson-Glycopyrrol-Formoterol 160-9-4.8 MCG/ACT AERO 2 puff (has no administration in time range)  levofloxacin (LEVAQUIN) IVPB 750 mg (has no administration in time range)  methylPREDNISolone sodium succinate (SOLU-MEDROL) 40 mg/mL injection 40 mg (has no administration in time range)    Followed by  predniSONE (DELTASONE) tablet 40 mg (has no administration in time range)  ipratropium-albuterol (DUONEB) 0.5-2.5 (3) MG/3ML nebulizer solution 3 mL (has no administration in time range)  albuterol (PROVENTIL) (2.5 MG/3ML) 0.083% nebulizer solution 2.5 mg (has no administration in time range)  enoxaparin (LOVENOX) injection 40 mg (has no administration in time range)  0.9 %  sodium chloride infusion (has no administration in time range)  ondansetron (ZOFRAN) tablet 4 mg (has no administration in time range)    Or  ondansetron (ZOFRAN) injection 4 mg (has no administration in time range)  methylPREDNISolone sodium succinate (SOLU-MEDROL) 125 mg/2 mL injection  125 mg (125 mg Intravenous Given 12/12/22 1720)  ipratropium (ATROVENT) nebulizer solution 1 mg (1 mg Nebulization Given 12/12/22 1721)  magnesium sulfate IVPB 2 g 50 mL (0 g Intravenous Stopped 12/12/22 1829)  albuterol (PROVENTIL) (2.5 MG/3ML) 0.083% nebulizer solution 10 mg (10 mg Nebulization Given 12/12/22 1722)  ibuprofen (ADVIL) tablet 600 mg (600 mg Oral Given 12/12/22 1854)  lactated ringers bolus 500 mL (500 mLs Intravenous New Bag/Given 12/12/22 1854)    ED Course/ Medical Decision Making/ A&P                             Medical Decision Making Amount and/or Complexity of Data Reviewed External Data Reviewed: notes. Labs: ordered.    Details: Normal lactate. Normal WBC Radiology: ordered and independent interpretation performed.    Details: No pneumonia ECG/medicine tests: ordered and independent interpretation performed.    Details: Sinus tachycardia  Risk OTC drugs. Prescription drug management. Decision regarding  hospitalization.   Patient presents in respiratory distress.  She did well with BiPAP and continuous albuterol.  Was also given Solu-Medrol and magnesium.  At this point there is no acute obvious infection.  This seems like a recurrent COPD issue.  She has done well enough to get off of BiPAP and has been placed on nasal cannula oxygen.  She will need continued monitoring and respiratory support.  Discussed with Dr. Joylene Igo for admission.  Low suspicion of PE or occult pneumonia at this point.        Final Clinical Impression(s) / ED Diagnoses Final diagnoses:  Acute respiratory failure, unspecified whether with hypoxia or hypercapnia (HCC)  COPD exacerbation (HCC)    Rx / DC Orders ED Discharge Orders     None         Pricilla Loveless, MD 12/12/22 2002

## 2022-12-12 NOTE — Assessment & Plan Note (Signed)
Blood pressure is stable 

## 2022-12-12 NOTE — Progress Notes (Signed)
   12/12/22 1934  BiPAP/CPAP/SIPAP  BiPAP/CPAP/SIPAP Pt Type Adult  BiPAP/CPAP/SIPAP V60   RT Note: Asked by ED RN/Staff to try pt. off BiPAP,(V-60) for trial, placed on HFNC([place]) 4 lpm , tolerating well and is AOX3, MD to be made aware, unit remains at bedside if needed, RT to follow.

## 2022-12-12 NOTE — Assessment & Plan Note (Signed)
Continue Pepcid  

## 2022-12-12 NOTE — ED Triage Notes (Signed)
Taken straight to [facility]. MD Renette Butters made aware
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
## 2022-12-12 NOTE — ED Notes (Signed)
Respiratory called per Maryjean Morn to remove Bi-Pap and see how Pt responds.

## 2022-12-12 NOTE — Assessment & Plan Note (Signed)
Secondary to acute COPD exacerbation At baseline patient has chronic hypoxic respiratory failure and wears 5 L of oxygen continuous She presented to the ER in respiratory distress with use of accessory muscles and increased work of breathing and required BiPAP Will attempt to wean off BiPAP and place on her scheduled home oxygen once her acute illness improves

## 2022-12-12 NOTE — Assessment & Plan Note (Signed)
Place patient on systemic and inhaled steroids Place patient on scheduled and as needed bronchodilator therapy Place on Levaquin 750 mg IV daily Continue oxygen supplementation and maintain pulse oximetry greater than 92%

## 2022-12-13 DIAGNOSIS — J441 Chronic obstructive pulmonary disease with (acute) exacerbation: Secondary | ICD-10-CM | POA: Diagnosis not present

## 2022-12-13 DIAGNOSIS — I10 Essential (primary) hypertension: Secondary | ICD-10-CM | POA: Diagnosis not present

## 2022-12-13 DIAGNOSIS — K219 Gastro-esophageal reflux disease without esophagitis: Secondary | ICD-10-CM

## 2022-12-13 DIAGNOSIS — J9621 Acute and chronic respiratory failure with hypoxia: Secondary | ICD-10-CM | POA: Diagnosis not present

## 2022-12-13 MED ORDER — UMECLIDINIUM BROMIDE 62.5 MCG/ACT IN AEPB
1.0000 | INHALATION_SPRAY | Freq: Every day | RESPIRATORY_TRACT | Status: DC
  Administered 2022-12-13 – 2022-12-14 (×2): 1 via RESPIRATORY_TRACT
  Filled 2022-12-13: qty 7

## 2022-12-13 MED ORDER — AZITHROMYCIN 250 MG PO TABS
500.0000 mg | ORAL_TABLET | Freq: Every day | ORAL | Status: DC
  Administered 2022-12-13 – 2022-12-14 (×2): 500 mg via ORAL
  Filled 2022-12-13 (×2): qty 2

## 2022-12-13 MED ORDER — METHYLPREDNISOLONE SODIUM SUCC 40 MG IJ SOLR
40.0000 mg | Freq: Two times a day (BID) | INTRAMUSCULAR | Status: DC
  Administered 2022-12-13 – 2022-12-14 (×2): 40 mg via INTRAVENOUS
  Filled 2022-12-13 (×2): qty 1

## 2022-12-13 MED ORDER — FLUTICASONE FUROATE-VILANTEROL 100-25 MCG/ACT IN AEPB
1.0000 | INHALATION_SPRAY | Freq: Every day | RESPIRATORY_TRACT | Status: DC
  Administered 2022-12-13 – 2022-12-14 (×2): 1 via RESPIRATORY_TRACT
  Filled 2022-12-13: qty 28

## 2022-12-13 NOTE — Progress Notes (Addendum)
   12/13/22 0038  Assess: MEWS Score  Temp 97.9 F (36.6 C)  BP 115/78  MAP (mmHg) 89  Pulse Rate 66  Resp (!) 26  SpO2 98 %  O2 Device Nasal Cannula  O2 Flow Rate (L/min) 4 L/min  Assess: MEWS Score  MEWS Temp 0  MEWS Systolic 0  MEWS Pulse 0  MEWS RR 2  MEWS LOC 0  MEWS Score 2  MEWS Score Color Yellow  Assess: if the MEWS score is Yellow or Red  Were vital signs accurate and taken at a resting state? Yes  Does the patient meet 2 or more of the SIRS criteria? No  MEWS guidelines implemented  Yes, yellow  Treat  MEWS Interventions Considered administering scheduled or prn medications/treatments as ordered  Take Vital Signs  Increase Vital Sign Frequency  Yellow: Q2hr x1, continue Q4hrs until patient remains green for 12hrs  Escalate  MEWS: Escalate Yellow: Discuss with charge nurse and consider notifying provider and/or RRT  Notify: Charge Nurse/RN  Name of Charge Nurse/RN Notified Lauren Hopkins RN  Assess: SIRS CRITERIA  SIRS Temperature  0  SIRS Pulse 0  SIRS Respirations  1  SIRS WBC 0  SIRS Score Sum  1

## 2022-12-13 NOTE — Plan of Care (Signed)
  Problem: Education: Goal: Knowledge of disease or condition will improve Outcome: Progressing   Problem: Activity: Goal: Ability to tolerate increased activity will improve Outcome: Progressing   Problem: Health Behavior/Discharge Planning: Goal: Ability to manage health-related needs will improve Outcome: Progressing   Problem: Activity: Goal: Risk for activity intolerance will decrease Outcome: Progressing   Problem: Nutrition: Goal: Adequate nutrition will be maintained Outcome: Progressing   Problem: Coping: Goal: Level of anxiety will decrease Outcome: Progressing

## 2022-12-13 NOTE — Progress Notes (Signed)
Mobility Specialist - Progress Note  ([place] 4L) Pre-mobility: 70 bpm HR, BP, 92% SpO2 During mobility: 104 bpm HR, 86% SpO2 Post-mobility: 92 bpm HR, BP, 90% SPO2   12/13/22 1214  Mobility  Activity Ambulated independently in hallway  Level of Assistance Independent after set-up  Assistive Device None  Distance Ambulated (ft) 350 ft  Range of Motion/Exercises Active  Activity Response Tolerated well  Mobility Referral Yes  $Mobility charge 1 Mobility  Mobility Specialist Start Time (ACUTE ONLY) 1204  Mobility Specialist Stop Time (ACUTE ONLY) 1214  Mobility Specialist Time Calculation (min) (ACUTE ONLY) 10 min   Pt was found on recliner chair and agreeable to ambulate. Stated feeling SOB with session and had x1 brief standing rest break for ~69min with SPO2 reading 86% increasing to 90%. At EOS returned to recliner chair with with all needs met.   Billey Chang Mobility Specialist

## 2022-12-13 NOTE — Progress Notes (Signed)
[facility]                                                                               [name], is a 62 y.o. female, DOB - 17-Aug-1960, [id number] Admit date - 12/12/2022    Outpatient Primary MD for the patient is Osei-Bonsu, Greggory Stallion, MD  LOS - 1  days    Brief summary   [name] is a 62 y.o. female with medical history significant for COPD with chronic hypoxic respiratory failure on 5 L of oxygen, history of hypertension, GERD, coronary artery disease who presents to the ER via EMS for evaluation of worsening shortness of breath from her baseline . She is on 5 lit of [place] oxygen at home, required BIPAP transiently, currently back on 4 to 5 lit of [place] oxygen.   Chest x-ray shows clear lungs.  No obvious infiltrate or effusion Twelve-lead EKG reviewed by me shows sinus tachycardia and PVCs. She received IV Solu-Medrol and IV magnesium as well as multiple bronchodilator treatments with improvement. She will be admitted to the hospital for further evaluation.  Assessment & Plan    Assessment and Plan: * Acute on chronic hypoxic respiratory failure (HCC) Secondary to acute COPD exacerbation At baseline patient has chronic hypoxic respiratory failure and wears 5 L of oxygen continuous She presented to the ER in respiratory distress with use of accessory muscles and increased work of breathing and required BiPAP Weaned off BIPAP, and is on 4 to 5 lit of [place] oxygen. Plan for IV steroids for 24 hours and transition to prednisone in am.  Azithromycin for 3 days.  Continue with duonebs.   Essential hypertension Well controlled.   GERD (gastroesophageal reflux disease) Continue Pepcid      Estimated body mass index is 25.77 kg/m as calculated from the following:   Height as of this encounter: 5\' 3"  (1.6 m).   Weight as of this encounter: 66 kg.  Code Status: full code.  DVT Prophylaxis:  enoxaparin (LOVENOX) injection 40 mg Start: 12/12/22  2200   Level of Care: Level of care: Progressive Family Communication: none at bedside.   Disposition Plan:     Remains inpatient appropriate: 24 hours of IV steroids, possible d/c in am.   Procedures:  None.   Consultants:   None.   Antimicrobials:   Anti-infectives (From admission, onward)    Start     Dose/Rate Route Frequency Ordered Stop   12/13/22 2000  azithromycin (ZITHROMAX) tablet 500 mg        500 mg Oral Daily 12/13/22 1346 12/16/22 0959   12/12/22 2030  levofloxacin (LEVAQUIN) IVPB 750 mg  Status:  Discontinued        750 mg 100 mL/hr over 90 Minutes Intravenous Every 24 hours 12/12/22 1959 12/13/22 1344        Medications  Scheduled Meds:  azithromycin  500 mg Oral Daily   enoxaparin (LOVENOX) injection  40 mg Subcutaneous QHS   famotidine  20 mg Oral Daily   umeclidinium bromide  1 puff Inhalation Daily   And   fluticasone furoate-vilanterol  1 puff Inhalation Daily   ipratropium-albuterol  3 mL Nebulization Q6H  methylPREDNISolone (SOLU-MEDROL) injection  40 mg Intravenous Q12H   Continuous Infusions: PRN Meds:.albuterol, ondansetron **OR** ondansetron (ZOFRAN) IV    Subjective:   Gudalupe Almario was seen and examined today.  Reports feeling better, some dyspnea on talking.  No chest pain .   Objective:   Vitals:   12/13/22 0729 12/13/22 1018 12/13/22 1352 12/13/22 1404  BP: 101/73 102/71  109/65  Pulse: 64 76  87  Resp: (!) 24 18  18   Temp: 97.8 F (36.6 C) 98.6 F (37 C)  98.4 F (36.9 C)  TempSrc: Oral Oral  Oral  SpO2: 100% 97% 97% 96%  Weight:      Height:        Intake/Output Summary (Last 24 hours) at 12/13/2022 1409 Last data filed at 12/13/2022 0806 Gross per 24 hour  Intake 1243.26 ml  Output --  Net 1243.26 ml   Filed Weights   12/12/22 1711  Weight: 66 kg     Exam General exam: Appears calm and comfortable  Respiratory system: Bilateral exp wheezing posteriorly. On 4 lit of [place] oxygen.  Cardiovascular  system: S1 & S2 heard, RRR no pedal edema or JVD.  Gastrointestinal system: Abdomen is nondistended, soft and nontender. Central nervous system: Alert and oriented. No focal neurological deficits. Extremities: Symmetric 5 x 5 power. Skin: No rashes, lesions or ulcers Psychiatry: Mood & affect appropriate.    Data Reviewed:  I have personally reviewed following labs and imaging studies   CBC Lab Results  Component Value Date   WBC 6.3 12/13/2022   RBC 3.92 12/13/2022   HGB 11.6 (L) 12/13/2022   HCT 37.3 12/13/2022   MCV 95.2 12/13/2022   MCH 29.6 12/13/2022   PLT 210 12/13/2022   MCHC 31.1 12/13/2022   RDW 14.6 12/13/2022   LYMPHSABS 1.2 12/12/2022   MONOABS 0.8 12/12/2022   EOSABS 0.1 12/12/2022   BASOSABS 0.0 12/12/2022     Last metabolic panel Lab Results  Component Value Date   NA 134 (L) 12/13/2022   K 4.3 12/13/2022   CL 101 12/13/2022   CO2 27 12/13/2022   BUN 15 12/13/2022   CREATININE 0.69 12/13/2022   GLUCOSE 123 (H) 12/13/2022   GFRNONAA >60 12/13/2022   GFRAA 106 06/25/2020   CALCIUM 8.5 (L) 12/13/2022   PHOS 3.6 08/11/2022   PROT 7.9 12/12/2022   ALBUMIN 4.3 12/12/2022   LABGLOB 2.2 09/19/2022   AGRATIO 1.9 09/19/2022   BILITOT 0.4 12/12/2022   ALKPHOS 66 12/12/2022   AST 19 12/12/2022   ALT 16 12/12/2022   ANIONGAP 6 12/13/2022    CBG (last 3)  No results for input(s): "GLUCAP" in the last 72 hours.    Coagulation Profile: No results for input(s): "INR", "PROTIME" in the last 168 hours.   Radiology Studies: DG Chest Portable 1 View  Result Date: 12/12/2022 CLINICAL DATA:  respiratory distress EXAM: PORTABLE CHEST 1 VIEW COMPARISON:  09/29/2022. FINDINGS: Bilateral lung fields are clear. Bilateral costophrenic angles are clear. Normal cardio-mediastinal silhouette. No acute osseous abnormalities. The soft tissues are within normal limits. IMPRESSION: No active disease. Electronically Signed   By: Jules Schick M.D.   On: 12/12/2022 18:57        Kathlen Mody M.D. [facility] 12/13/2022, 2:09 PM  Available via Epic secure chat 7am-7pm After 7 pm, please refer to night coverage provider listed on amion.

## 2022-12-13 NOTE — TOC CM/SW Note (Signed)
Transition of Care [facility]) - Inpatient Brief Assessment   Patient Details  Name: [name] MRN: [id number] Date of Birth: 08-Dec-1960  [facility]) CM/SW Contact:    Howell Rucks, RN Phone Number: 12/13/2022, 11:07 AM   Clinical Narrative: Met with patient at  bedside to introduce role of TOC/NCM and review for dc planning. Pt reports she has PCP and pharmacy in place, no home care services, no home DME. Pt reports [facility] is her home 02 provider. Pt reports she drove herself to hospital [facility] will follow for transportation needs at discharge. TOC Brief Assessment completed. No TOC needs identified.     Transition of Care Asessment: Insurance and Status: Insurance coverage has been reviewed Patient has primary care physician: Yes Home environment has been reviewed: resides in an apartment with assitance from 47 year old granddaughter Prior level of function:: Independent Prior/Current Home Services: No current home services Social Determinants of Health Reivew: SDOH reviewed no interventions necessary Readmission risk has been reviewed: Yes Transition of care needs: no transition of care needs at this time

## 2022-12-14 DIAGNOSIS — J9600 Acute respiratory failure, unspecified whether with hypoxia or hypercapnia: Secondary | ICD-10-CM

## 2022-12-14 DIAGNOSIS — J9621 Acute and chronic respiratory failure with hypoxia: Secondary | ICD-10-CM | POA: Diagnosis not present

## 2022-12-14 DIAGNOSIS — J441 Chronic obstructive pulmonary disease with (acute) exacerbation: Secondary | ICD-10-CM | POA: Diagnosis not present

## 2022-12-14 MED ORDER — AZITHROMYCIN 500 MG PO TABS: 500.0000 mg | ORAL_TABLET | Freq: Every day | ORAL | 0 refills | Status: AC

## 2022-12-14 MED ORDER — PREDNISONE 10 MG PO TABS: ORAL_TABLET | ORAL | 0 refills | Status: DC

## 2022-12-14 MED ORDER — PREDNISONE 10 MG PO TABS: 10.0000 mg | ORAL_TABLET | Freq: Every day | ORAL | Status: DC

## 2022-12-14 NOTE — Discharge Summary (Signed)
Physician Discharge Summary   Patient: [name] MRN: [id number] DOB: [birth year]  Admit date:     12/12/2022  Discharge date: 12/14/22  Discharge Physician: Thad Ranger, MD    PCP: Jackie Plum, MD   Recommendations at discharge:   Continue Azithromax, prednisone with taper Outpatient follow-up with pulmonology, Dr. Isaiah Serge  Discharge Diagnoses:    Acute on chronic hypoxic respiratory failure (HCC)   COPD with acute exacerbation (HCC)   GERD (gastroesophageal reflux disease)   Essential hypertension    Hospital Course:  [name] is a 62 y.o. female with medical history significant for COPD with chronic hypoxic respiratory failure on 5 L of oxygen, history of hypertension, GERD, coronary artery disease who presents to the ER via EMS for evaluation of worsening shortness of breath from her baseline . She is on 5 lit of [place] oxygen at home, required BIPAP transiently, currently back on 4 to 5 lit of [place] oxygen.    Chest x-ray shows clear lungs.  No obvious infiltrate or effusion EKG shows sinus tachycardia and PVCs. She received IV Solu-Medrol and IV magnesium as well as multiple bronchodilator treatments with improvement.  Patient was admitted for further workup.  Assessment and Plan:  Acute on chronic hypoxic respiratory failure (HCC) Secondary to acute COPD exacerbation -At baseline, patient has chronic respiratory failure with hypoxia, on 5 L O2 continuous -Presented to ED with respiratory distress and was placed on BiPAP -Has not required BiPAP since then, weaned off -Patient back on 5 L O2, at baseline, no significant wheezing today -Wants to go home, continue Zithromax, nebs per outpatient regimen -Placed on prednisone with taper    Essential hypertension Stable   GERD (gastroesophageal reflux disease) Continue Pepcid     Pain control - [place] Controlled Substance Reporting System database was reviewed. and patient was instructed, not to  drive, operate heavy machinery, perform activities at heights, swimming or participation in water activities or provide baby-sitting services while on Pain, Sleep and Anxiety Medications; until their outpatient Physician has advised to do so again. Also recommended to not to take more than prescribed Pain, Sleep and Anxiety Medications.  Consultants: None Procedures performed: None Disposition: Home Diet recommendation:  Discharge Diet Orders (From admission, onward)     Start     Ordered   12/14/22 0000  Diet - low sodium heart healthy        12/14/22 1028            DISCHARGE MEDICATION: Allergies as of 12/14/2022       Reactions   Amoxicillin Shortness Of Breath   Statins Other (See Comments)   Worsened the patient's COPD   Tylenol [acetaminophen] Shortness Of Breath   Zetia [ezetimibe] Other (See Comments)   Worsened the patient's COPD        Medication List     TAKE these medications    albuterol 108 (90 Base) MCG/ACT inhaler Commonly known as: VENTOLIN HFA Inhale 2 puffs into the lungs every 4 (four) hours as needed for wheezing or shortness of breath.   albuterol (2.5 MG/3ML) 0.083% nebulizer solution Commonly known as: PROVENTIL Take 3 mLs (2.5 mg total) by nebulization every 4 (four) hours as needed for wheezing or shortness of breath.   azithromycin 500 MG tablet Commonly known as: ZITHROMAX Take 1 tablet (500 mg total) by mouth daily for 5 days.   Breztri Aerosphere 160-9-4.8 MCG/ACT Aero Generic drug: Budeson-Glycopyrrol-Formoterol Inhale 2 puffs into the lungs in the morning and at bedtime.  famotidine 20 MG tablet Commonly known as: PEPCID Take 1 tablet (20 mg total) by mouth daily. What changed: when to take this   IBU-200 PO Take 200-400 mg by mouth every 6 (six) hours as needed (for pain or headaches).   OXYGEN Inhale 5 L/min into the lungs See admin instructions. 5 L/min at bedtime and as needed for shortness of breath throughout the  daytime   Praluent 150 MG/ML Soaj Generic drug: Alirocumab Inject 1 mL (150 mg total) into the skin every 14 (fourteen) days.   predniSONE 10 MG tablet Commonly known as: DELTASONE Take 1 tablet (10 mg total) by mouth daily with breakfast. resume after finishing the prednisone taper What changed:  how much to take how to take this when to take this additional instructions   predniSONE 10 MG tablet Commonly known as: DELTASONE Prednisone dosing: Take  Prednisone 40mg  (4 tabs) x 3 days, then taper to 30mg  (3 tabs) x 3 days, then 20mg  (2 tabs) x 3days, then continue your maintenance dose of 10mg  until follow up with PCP/pulmonology What changed:  medication strength additional instructions        Follow-up Information     Osei-Bonsu, Greggory Stallion, MD. Schedule an appointment as soon as possible for a visit in 2 week(s).   Specialty: Internal Medicine Why: for hospital follow-up Contact information: [address] [place] [address] [phone]         Chilton Greathouse, MD. Schedule an appointment as soon as possible for a visit in 2 week(s).   Specialty: Pulmonary Disease Why: for hospital follow-up Contact information: [address] [place] [address] [phone]                Discharge Exam: Ceasar Mons Weights   12/12/22 1711  Weight: 66 kg   S: Sitting up in the chair, has not required BiPAP.  On 5 L O2, at baseline, pleasant, wants to go home.  No fever or chills, no wheezing.  No cough  Physical Exam General: Alert and oriented x 3, NAD Cardiovascular: S1 S2 clear, RRR.  Respiratory: CTAB, no wheezing, rales or rhonchi Gastrointestinal: Soft, nontender, nondistended, NBS Ext: no pedal edema bilaterally Neuro: no new deficits Psych: Normal affect, pleasant  Condition at discharge: fair  The results of significant diagnostics from this hospitalization (including imaging, microbiology, ancillary and laboratory) are listed below for  reference.   Imaging Studies: DG Chest Portable 1 View  Result Date: 12/12/2022 CLINICAL DATA:  respiratory distress EXAM: PORTABLE CHEST 1 VIEW COMPARISON:  09/29/2022. FINDINGS: Bilateral lung fields are clear. Bilateral costophrenic angles are clear. Normal cardio-mediastinal silhouette. No acute osseous abnormalities. The soft tissues are within normal limits. IMPRESSION: No active disease. Electronically Signed   By: Jules Schick M.D.   On: 12/12/2022 18:57    Microbiology: Results for orders placed or performed during the hospital encounter of 12/12/22  Blood culture (routine x 2)     Status: None (Preliminary result)   Collection Time: 12/13/22  1:14 AM   Specimen: BLOOD RIGHT ARM  Result Value Ref Range Status   Specimen Description   Final    BLOOD RIGHT ARM Performed at [facility], [address], [place], [place] [address]    Special Requests   Final    BOTTLES DRAWN AEROBIC AND ANAEROBIC Blood Culture adequate volume Performed at [facility], [address], [place], [place] [address]    Culture   Final    NO  GROWTH 1 DAY Performed at [facility], [address] [place], [place] [address]    Report Status PENDING  Incomplete  Blood culture (routine x 2)     Status: None (Preliminary result)   Collection Time: 12/13/22  1:19 AM   Specimen: BLOOD LEFT HAND  Result Value Ref Range Status   Specimen Description   Final    BLOOD LEFT HAND Performed at [facility], [address], [place], [place] [address]    Special Requests   Final    BOTTLES DRAWN AEROBIC AND ANAEROBIC Blood Culture adequate volume Performed at [facility] [address] [place], [place] [address]    Culture   Final    NO GROWTH 1 DAY Performed at [facility], [address], [place], [place] [address]    Report Status PENDING  Incomplete    Labs: CBC: Recent Labs  Lab 12/12/22 1710 12/13/22 0114  WBC  9.2 6.3  NEUTROABS 7.0  --   HGB 13.9 11.6*  HCT 45.9 37.3  MCV 96.4 95.2  PLT 256 210   Basic Metabolic Panel: Recent Labs  Lab 12/12/22 1710 12/13/22 0114  NA 136 134*  K 4.2 4.3  CL 97* 101  CO2 29 27  GLUCOSE 104* 123*  BUN 15 15  CREATININE 0.76 0.69  CALCIUM 9.2 8.5*   Liver Function Tests: Recent Labs  Lab 12/12/22 1710  AST 19  ALT 16  ALKPHOS 66  BILITOT 0.4  PROT 7.9  ALBUMIN 4.3   CBG: No results for input(s): "GLUCAP" in the last 168 hours.  Discharge time spent: greater than 30 minutes.  Signed: Thad Ranger, MD [facility] 12/14/2022

## 2022-12-14 NOTE — Progress Notes (Signed)
Mobility Specialist - Progress Note   12/14/22 1005  Oxygen Therapy  SpO2 95 %  O2 Device Nasal Cannula  O2 Flow Rate (L/min) 5 L/min  Patient Activity (if Appropriate) Ambulating  Mobility  Activity Ambulated independently in hallway  Level of Assistance Independent  Assistive Device None  Distance Ambulated (ft) 350 ft  Activity Response Tolerated well  Mobility Referral Yes  $Mobility charge 1 Mobility  Mobility Specialist Start Time (ACUTE ONLY) 0957  Mobility Specialist Stop Time (ACUTE ONLY) 1004  Mobility Specialist Time Calculation (min) (ACUTE ONLY) 7 min   Pt received in recliner and agreeable to mobility. SpO2 >95% during ambulation. No complaints during session. Pt to recliner after session with all needs met.    [facility]

## 2022-12-16 ENCOUNTER — Telehealth: Payer: Self-pay | Admitting: Pulmonary Disease

## 2022-12-16 NOTE — Telephone Encounter (Signed)
ATC patient to clarify if patient is needing Albuterol HFA or Albuterol neb refill and pharmacy.  LM for patient to call back when available.

## 2022-12-21 ENCOUNTER — Encounter: Payer: Self-pay | Admitting: Cardiology

## 2022-12-21 ENCOUNTER — Ambulatory Visit: Payer: BC Managed Care – PPO | Attending: Cardiology | Admitting: Cardiology

## 2022-12-21 VITALS — BP 102/64 | HR 90 | Ht 63.0 in | Wt 148.1 lb

## 2022-12-21 DIAGNOSIS — I2584 Coronary atherosclerosis due to calcified coronary lesion: Secondary | ICD-10-CM | POA: Diagnosis not present

## 2022-12-21 DIAGNOSIS — I429 Cardiomyopathy, unspecified: Secondary | ICD-10-CM | POA: Diagnosis not present

## 2022-12-21 DIAGNOSIS — I10 Essential (primary) hypertension: Secondary | ICD-10-CM

## 2022-12-21 DIAGNOSIS — I2510 Atherosclerotic heart disease of native coronary artery without angina pectoris: Secondary | ICD-10-CM
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
## 2022-12-21 NOTE — Patient Instructions (Addendum)
Medication Instructions:  Your physician recommends that you continue on your current medications as directed. Please refer to the Current Medication list given to you today.  *If you need a refill on your cardiac medications before your next appointment, please call your pharmacy*  Lab Work: Same day as your echocardiogram: BMET, LFTs, Lipids If you have labs (blood work) drawn today and your tests are completely normal, you will receive your results only by: MyChart Message (if you have MyChart) OR A paper copy in the mail If you have any lab test that is abnormal or we need to change your treatment, we will call you to review the results.  Testing/Procedures: Your physician has requested that you have an echocardiogram. Echocardiography is a painless test that uses sound waves to create images of your heart. It provides your doctor with information about the size and shape of your heart and how well your heart's chambers and valves are working. This procedure takes approximately one hour. There are no restrictions for this procedure. Please do NOT wear cologne, perfume, aftershave, or lotions (deodorant is allowed). Please arrive 15 minutes prior to your appointment time.   Follow-Up: At [facility], you and your health needs are our priority.  As part of our continuing mission to provide you with exceptional heart care, we have created designated Provider Care Teams.  These Care Teams include your primary Cardiologist (physician) and Advanced Practice Providers (APPs -  Physician Assistants and Nurse Practitioners) who all work together to provide you with the care you need, when you need it.  Your next appointment:   9 month(s)  The format for your next appointment:   In Person  Provider:   Belva Crome, MD{

## 2022-12-22 ENCOUNTER — Telehealth: Payer: Self-pay | Admitting: Pulmonary Disease

## 2022-12-22 NOTE — Telephone Encounter (Signed)
Received a fax to renew patient FMLA.  I've completed the form and included recent appointment dates and extended expiration to 12/14/2023.  I will give the form to Dr. Isaiah Serge to sign when he is in the office on 12/28/23.

## 2023-01-06 NOTE — Telephone Encounter (Signed)
Updated FMLA provider's statement was signed by Dr. Isaiah Serge on 8/15 and I have faxed it and recent office and hospital notes to [employer]  fax# [phone].  Hard copy was mailed to the the patient.

## 2023-01-17 ENCOUNTER — Ambulatory Visit (HOSPITAL_BASED_OUTPATIENT_CLINIC_OR_DEPARTMENT_OTHER): Payer: BC Managed Care – PPO

## 2023-01-18 ENCOUNTER — Ambulatory Visit: Payer: BC Managed Care – PPO | Admitting: Pulmonary Disease

## 2023-01-18 ENCOUNTER — Encounter: Payer: Self-pay | Admitting: Pulmonary Disease

## 2023-01-18 VITALS — BP 112/66 | HR 99 | Temp 98.0°F | Ht 63.0 in | Wt 149.4 lb

## 2023-01-18 DIAGNOSIS — J441 Chronic obstructive pulmonary disease with (acute) exacerbation: Secondary | ICD-10-CM | POA: Diagnosis not present

## 2023-01-18 MED ORDER — TRELEGY ELLIPTA 200-62.5-25 MCG/ACT IN AEPB: 1.0000 | INHALATION_SPRAY | Freq: Every day | RESPIRATORY_TRACT | 3 refills | Status: DC

## 2023-01-18 MED ORDER — PREDNISONE 10 MG PO TABS: 20.0000 mg | ORAL_TABLET | Freq: Every day | ORAL | 3 refills | Status: DC

## 2023-01-18 MED ORDER — DALIRESP 250 MCG PO TABS: 250.0000 ug | ORAL_TABLET | Freq: Every day | ORAL | 3 refills | Status: DC

## 2023-01-18 NOTE — Progress Notes (Signed)
[name]    [id number]    1960/08/17  Primary Care Physician:Osei-Bonsu, Greggory Stallion, MD  Referring Physician: Jackie Plum, MD [address] [place],  [place] [address]  Chief complaint:  Follow-up for  Severe COPD  HPI: 62 year old smoker with very severe COPD, chronic hypoxic respiratory failure.  She is previously followed by Dr. Jamison Neighbor.  Pulmicort and Perforomist were ordered last year but insurance would not cover She was then placed on Symbicort in 2018 but had worsening symptoms of dyspnea, wheeze. Inhalers were switched to Trelegy at office visit in March 2019.    Continues to have chronic cough, dyspnea on exertion, intermittent hemoptysis with mucus Plavix has been held by Dr. Dulce Sellar, cardiology  She was treated for COPD exacerbation around late November 2022.  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 Exposures: No mold, hot tub, Jacuzzi. Smoking history: 35-pack-year smoker.  Quit in 2011 Travel history: No significant travel history Relevant family history: No significant family history of lung disease.  Interim history: Admitted on 12/12/2022 with mild COPD exacerbation.  She was on BiPAP briefly.  Chest x-ray showed clear lungs.  Treated with IV Solu-Medrol, bronchodilators, Z-Pak with improvement and she is discharged in 2 days Post discharge she is doing well but has occasional wheeze.  She does not like Breztri and is hardly  using it.   Outpatient Encounter Medications as of 01/18/2023  Medication Sig   albuterol (PROVENTIL) (2.5 MG/3ML) 0.083% nebulizer solution Take 3 mLs (2.5 mg total) by nebulization every 4 (four) hours as needed for wheezing or shortness of breath.   albuterol (VENTOLIN HFA) 108 (90 Base) MCG/ACT inhaler Inhale 2 puffs into the lungs every 4 (four) hours as needed for wheezing or shortness of breath.   Alirocumab (PRALUENT) 150 MG/ML SOAJ Inject 1 mL (150 mg total) into the skin every 14 (fourteen) days.   famotidine (PEPCID) 20 MG tablet Take 1 tablet (20 mg total) by mouth daily.   Ibuprofen (IBU-200 PO) Take 200-400 mg by mouth every 6 (six) hours as needed (for pain or headaches).   OXYGEN Inhale 5 L/min into the lungs See admin instructions. 5 L/min at bedtime and as needed for shortness of breath throughout the daytime   predniSONE (DELTASONE) 10 MG tablet Take 1 tablet (10 mg total) by mouth daily with breakfast. resume after finishing the prednisone taper   predniSONE (DELTASONE) 10 MG tablet Prednisone dosing: Take  Prednisone 40mg  (4 tabs) x 3 days, then taper to 30mg  (3 tabs) x 3 days, then 20mg  (2 tabs) x 3days, then continue your maintenance dose of 10mg  until follow up with PCP/pulmonology   Fluticasone Furoate-Vilanterol (BREO ELLIPTA IN) Inhale 1 puff into the lungs in the morning. (Patient not taking: Reported on 01/18/2023)   No facility-administered encounter medications on file as of 01/18/2023.   Physical Exam: Blood pressure 112/66, pulse 99, temperature 98 F (36.7 C), temperature source Oral, height 5\' 3"  (1.6 m),  weight 149 lb 6.4 oz (67.8 kg), SpO2 92%. Gen:      No acute distress HEENT:  EOMI, sclera anicteric Neck:     No masses; no thyromegaly Lungs:    Bilateral expiratory wheeze CV:         Regular rate and rhythm; no murmurs Abd:      + bowel sounds; soft, non-tender; no palpable masses, no distension Ext:    No edema; adequate peripheral perfusion Skin:       Warm and dry; no rash Neuro: alert and oriented x 3 Psych: normal mood and affect   Data Reviewed: Imaging: See Low-dose screening CT 04/19/2021-new areas of bilateral subpleural groundglass consolidation in the lower lobes, coronary artery calcification, enlarged PA, emphysema.  Screening CT 07/15/21 - new large consolidation right middle and right lower lobe  CT 11/01/2021-irregular focus of consolidation in the right middle lobe I have reviewed the images personally.  PFT 06/02/17 FVC 1.50 [60%], FEV1 0.79 [40%], F/F 53, TLC 96, RV/TLC 167%, DLCO 32% Severe obstruction with bronchodilator response, air-trapping.  Severe diffusion impairment.  11/10/15:  Walked 360 meters / Baseline Sat 96% on RA / Nadir Sat 95% on RA  MICROBIOLOGY Sputum Ctx (11/13/15):  Paecilomyces species / Oral Flora / AFB negative    LABS 03/01/15 HIV:  Negative   09/29/14 ANA:  Negative   05/16/11 Alpha-1 antitrypsin: MM (122)  Cardiac work-up by Dr. Jacinto Halim Nuclear stress test 11/14/16-very small inferoseptal ischemia, preserved LVEF with mild hypokinesis the same region Echocardiogram shows mild pulmonary hypertension with normal LV systolic function.  Assessment:  Severe COPD with acute exacerbation, frequent exacerbations of COPD She continues to be symptomatic with wheeze and dyspnea Continue breztri, nebs as needed, continue supplemental oxygen Give Depo injection today and prednisone 40 mg a day for 5 days.  At the end of 5 days start on prednisone at 10 mg/day Start Daliresp to reduce frequency of exacerbations Once insurance approves Daliresp and she is stable on that we can try weaning her off the prednisone completely  Consider chronic azithromycin if she continues to have exacerbations  Referral to pulmonary rehab  Abnormal CT Continues to have waxing waning nodular opacities most recently seen on April 2024 Order follow-up CT in 6 months  Health maintenance Influenza  vaccine-states that she got her flu vaccine this year at [facility]. 09/03/2011-Pneumovax  Plan/Recommendations: - Continue breztri - Continue supplemental oxygen - Daliresp, chronic prednisone - Pulmonary rehab - Follow-up CT chest  Chilton Greathouse MD [facility] 01/18/2023, 3:16 PM  CC: Jackie Plum, MD

## 2023-01-18 NOTE — Patient Instructions (Addendum)
Started medication called Daliresp once a day to help control his COPD Increase prednisone to 2 pills a day Will change the Breztri inhaler to Trelegy Follow-up in 1 month

## 2023-01-20 ENCOUNTER — Other Ambulatory Visit: Payer: Self-pay | Admitting: Cardiology

## 2023-01-20 ENCOUNTER — Encounter: Payer: Self-pay | Admitting: Primary Care

## 2023-01-20 ENCOUNTER — Ambulatory Visit: Payer: BC Managed Care – PPO

## 2023-01-20 ENCOUNTER — Ambulatory Visit (INDEPENDENT_AMBULATORY_CARE_PROVIDER_SITE_OTHER): Payer: BC Managed Care – PPO | Admitting: Primary Care

## 2023-01-20 ENCOUNTER — Telehealth: Payer: Self-pay | Admitting: Primary Care

## 2023-01-20 VITALS — BP 110/70 | HR 90 | Ht 63.0 in | Wt 149.4 lb

## 2023-01-20 DIAGNOSIS — I10 Essential (primary) hypertension: Secondary | ICD-10-CM

## 2023-01-20 DIAGNOSIS — J9611 Chronic respiratory failure with hypoxia: Secondary | ICD-10-CM

## 2023-01-20 DIAGNOSIS — Z9109 Other allergy status, other than to drugs and biological substances: Secondary | ICD-10-CM | POA: Insufficient documentation

## 2023-01-20 DIAGNOSIS — J449 Chronic obstructive pulmonary disease, unspecified: Secondary | ICD-10-CM

## 2023-01-20 DIAGNOSIS — I2510 Atherosclerotic heart disease of native coronary artery without angina pectoris: Secondary | ICD-10-CM

## 2023-01-20 DIAGNOSIS — J441 Chronic obstructive pulmonary disease with (acute) exacerbation: Secondary | ICD-10-CM

## 2023-01-20 DIAGNOSIS — I429 Cardiomyopathy, unspecified: Secondary | ICD-10-CM

## 2023-01-20 HISTORY — DX: Other allergy status, other than to drugs and biological substances: Z91.09

## 2023-01-20 MED ORDER — IPRATROPIUM-ALBUTEROL 0.5-2.5 (3) MG/3ML IN SOLN: 3.0000 mL | Freq: Four times a day (QID) | RESPIRATORY_TRACT | 1 refills | Status: DC | PRN

## 2023-01-20 MED ORDER — OMEPRAZOLE 20 MG PO CPDR: 20.0000 mg | DELAYED_RELEASE_CAPSULE | Freq: Every day | ORAL | 1 refills | Status: DC

## 2023-01-20 MED ORDER — PREDNISONE 10 MG PO TABS: ORAL_TABLET | ORAL | 0 refills | Status: DC

## 2023-01-20 MED ORDER — AZITHROMYCIN 250 MG PO TABS: ORAL_TABLET | ORAL | 0 refills | Status: DC

## 2023-01-20 MED ORDER — METHYLPREDNISOLONE ACETATE 80 MG/ML IJ SUSP
80.0000 mg | Freq: Once | INTRAMUSCULAR | Status: DC
Start: 2023-01-20 — End: 2023-03-07

## 2023-01-20 MED ORDER — LORATADINE 10 MG PO TABS: 10.0000 mg | ORAL_TABLET | Freq: Every day | ORAL | 2 refills | Status: DC

## 2023-01-20 NOTE — Telephone Encounter (Signed)
Pt calling in bc she is saying she was giving nebulizer meds while here but forgot to send it in but doesn't know what the name of the neb medicine

## 2023-01-20 NOTE — Assessment & Plan Note (Signed)
-   Stable; O2 92% RA. Continue supplemental oxygen with exertion as needed to maintain O2 >88-90% and at night

## 2023-01-20 NOTE — Patient Instructions (Addendum)
Recommendations: - Continue Trelegy Ellipta one puff daily - Continue prednisone 20mg   - Continue famotidine at bedtime - Start Claritin 10mg  daily  - Start omeprazole 20mg  daily in the morning  - Start Daliresp once you get medication - Start prednisone taper (the resume 20mg  daily) and zpack as directed   Orders: Depo-medrol 80mg  IM Ipratropium-albuterol x1  Rx: Zpack Prednisone taper   Orders CXR   Follow-up: 1 month with Dr. Isaiah Serge (recall already in)

## 2023-01-20 NOTE — Assessment & Plan Note (Addendum)
-   Asthmatic component to COPD, steroid dependent.  - RAST panel was negative, Eos <300. IgE 22.  - Start loratadine 10mg  daily - Consider resuming montelukast if needed in the future and/or possibly consider biologics

## 2023-01-20 NOTE — Assessment & Plan Note (Signed)
-   Uncontrolled GERD contributing to AECOPD - Continue Famotidine 20mg  at bedtime - Start omeprazole 20mg  daily first thing in the morning

## 2023-01-20 NOTE — Assessment & Plan Note (Addendum)
-   Patient reports increased wheezing symptoms last 2 days with productive cough. Patient had audible upper airway wheeze on exam. Treating for acute exacerbation with depo-medrol 80mg  IM, ipratropium-albuterol neb, prednisone taper and zpack. She appeared a lot better after completing neb treatment in office, ease of breathing improved significantly. We will check CXR. Continue Trelegy and stay on prednisone 20mg  daily. Long term plan, once insurance approved Dairesp and she is stable we will try to wean her off prednisone completely. Consider chronic azithromycin if continues to have exacerbations.    Recommendations: - Continue Trelegy Ellipta one puff daily - Continue prednisone 20mg   - Continue famotidine at bedtime - Start Claritin 10mg  daily  - Start omeprazole 20mg  daily in the morning  - Start Daliresp once you get medication - Start prednisone taper (the resume 20mg  daily) and zpack as directed   Orders: Depo-medrol 80mg  IM Ipratropium-albuterol x1  Rx: Zpack Prednisone taper   Orders CXR   Follow-up: 1 month with Dr. Isaiah Serge (recall already in)
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
## 2023-01-20 NOTE — Addendum Note (Signed)
Addended by: Glenford Bayley on: 01/20/2023 05:43 PM   Modules accepted: Orders

## 2023-01-25 NOTE — Telephone Encounter (Signed)
Correct, it was an office treatment. I did not intend to start her on treatment. If she is till having breathing difficulty we certainly can send in ipratropium-albuterol nebulizer q6 hours for sob prn. Does she have a machine? Or would she needs DME order for that as well

## 2023-01-25 NOTE — Telephone Encounter (Signed)
I will forward to pharmacy to look into her benefits/ What triple therapy is formulary on patients insurance plan?

## 2023-01-25 NOTE — Telephone Encounter (Signed)
It looks like duoneb got sent in 9/6

## 2023-01-25 NOTE — Telephone Encounter (Signed)
I see that she was given nebulizer treatment in office but dont see mention of one being sent in. Please verify.

## 2023-01-25 NOTE — Telephone Encounter (Signed)
Patient has a nebulizer machine and would like the medication sent in. [facility]. She is asking for a daily inhaler that is cheaper than the trelegy as she cannot afford that one.

## 2023-01-26 ENCOUNTER — Other Ambulatory Visit (HOSPITAL_COMMUNITY): Payer: Self-pay

## 2023-01-26 NOTE — Telephone Encounter (Signed)
With the Trelegy being recently filled-patients insurance will not allow me to check on the price of the other triple therapy of Breztri. Patient may want to check if she is in her deductible/Coverage Gap phase causing the Trelegy to be a higher co-pay.

## 2023-01-27 ENCOUNTER — Other Ambulatory Visit (HOSPITAL_COMMUNITY): Payer: Self-pay

## 2023-01-27 ENCOUNTER — Other Ambulatory Visit: Payer: Self-pay | Admitting: Pulmonary Disease

## 2023-01-27 NOTE — Telephone Encounter (Signed)
There is still a "therapeutic duplicate" fill showing from 09/12, the alternative of Breztri for a triple inhaler will most likely not be cheaper/much cheaper than the Trelegy since they are both Brand name drugs with no generic equivalent.

## 2023-01-27 NOTE — Telephone Encounter (Signed)
Patient did not pick up the Trelegy.  She will call the pharmacy and have them back out the rx now.  Patient requesting you try again to check on the price of other maintenance inhalers.  She should have this done by 1:30 pm (after lunch).

## 2023-01-31 ENCOUNTER — Other Ambulatory Visit (HOSPITAL_COMMUNITY): Payer: Self-pay

## 2023-01-31 NOTE — Telephone Encounter (Signed)
A PA is not needed for Trelegy. Contacted patient pharmacy and got the following information: Trelegy for a 30 day supply is $157.87

## 2023-01-31 NOTE — Telephone Encounter (Signed)
Called patient.  Patient states she has not picked up any medications since she got the last fill on her Breztri.  Patient states the Trelegy will cost her $157.00.  She cannot afford this.  The Breztri with the coupon cost $40 for a 90 days supply, but patient states Breztri made patients chest hurt and breathing was worse while on Breztri. Can we do a prior auth for Trelegy?  Patient states she was give Trelegy while in the hospital and she did very well while on this. Please advise.  Thank you.

## 2023-01-31 NOTE — Telephone Encounter (Signed)
Called patient.  Gave all information.  Patient will have to pay out of pocket the copay of $157.87 to get the Trelegy inhaler after insurance pays.  Patient may be in deductible/Coverage Gap phase of her insurance. Instructed patient to call clinic if she would like to discuss getting samples or changing to a different medication that is less expensive for patient.  Patient verbalized understanding.

## 2023-02-07 LAB — BASIC METABOLIC PANEL
BUN/Creatinine Ratio: 18 (ref 12–28)
BUN: 10 mg/dL (ref 8–27)
CO2: 29 mmol/L (ref 20–29)
Calcium: 9 mg/dL (ref 8.7–10.3)
Chloride: 103 mmol/L (ref 96–106)
Creatinine, Ser: 0.57 mg/dL (ref 0.57–1.00)
Glucose: 86 mg/dL (ref 70–99)
Potassium: 5.2 mmol/L (ref 3.5–5.2)
Sodium: 142 mmol/L (ref 134–144)
eGFR: 101 mL/min/{1.73_m2} (ref >59)

## 2023-02-09 ENCOUNTER — Ambulatory Visit (HOSPITAL_BASED_OUTPATIENT_CLINIC_OR_DEPARTMENT_OTHER)
Admission: RE | Admit: 2023-02-09 | Discharge: 2023-02-09 | Disposition: A | Discharge status: Home / Self Care | Payer: BC Managed Care – PPO | Source: Ambulatory Visit | Attending: Cardiology | Admitting: Cardiology

## 2023-02-09 DIAGNOSIS — I2584 Coronary atherosclerosis due to calcified coronary lesion: Secondary | ICD-10-CM | POA: Insufficient documentation

## 2023-02-09 DIAGNOSIS — I2510 Atherosclerotic heart disease of native coronary artery without angina pectoris: Secondary | ICD-10-CM | POA: Diagnosis present

## 2023-02-09 DIAGNOSIS — I429 Cardiomyopathy, unspecified: Secondary | ICD-10-CM | POA: Diagnosis present

## 2023-02-13 LAB — ECHOCARDIOGRAM COMPLETE
AR max vel: 2.43 cm2
AV Area VTI: 2.47 cm2
AV Area mean vel: 2.29 cm2
AV Mean grad: 8 mm[Hg]
AV Peak grad: 13.2 mm[Hg]
AV Vena cont: 0.3 cm
Ao pk vel: 1.82 m/s
Area-P 1/2: 4.29 cm2
Calc EF: 61.9 %
MV M vel: 4.04 m/s
MV Peak grad: 65.1 mm[Hg]
P 1/2 time: 577 ms
S' Lateral: 2.9 cm
Single Plane A2C EF: 60.2 %
Single Plane A4C EF: 61.8 %

## 2023-02-16 ENCOUNTER — Telehealth: Payer: Self-pay | Admitting: Pulmonary Disease

## 2023-02-16 NOTE — Telephone Encounter (Signed)
Patient needs refill on albuterol inhaler

## 2023-02-20 ENCOUNTER — Other Ambulatory Visit: Payer: Self-pay

## 2023-02-20 MED ORDER — ALBUTEROL SULFATE HFA 108 (90 BASE) MCG/ACT IN AERS: 2.0000 | INHALATION_SPRAY | RESPIRATORY_TRACT | 5 refills | Status: DC | PRN

## 2023-02-20 NOTE — Telephone Encounter (Signed)
Refill has bee sent to patient's pharmacy.

## 2023-03-07 ENCOUNTER — Encounter: Payer: Self-pay | Admitting: Adult Health

## 2023-03-07 ENCOUNTER — Ambulatory Visit (INDEPENDENT_AMBULATORY_CARE_PROVIDER_SITE_OTHER): Payer: BC Managed Care – PPO | Admitting: Adult Health

## 2023-03-07 VITALS — BP 128/72 | HR 98 | Ht 63.0 in | Wt 161.4 lb

## 2023-03-07 DIAGNOSIS — R9389 Abnormal findings on diagnostic imaging of other specified body structures: Secondary | ICD-10-CM

## 2023-03-07 DIAGNOSIS — J441 Chronic obstructive pulmonary disease with (acute) exacerbation: Secondary | ICD-10-CM | POA: Diagnosis not present

## 2023-03-07 DIAGNOSIS — J302 Other seasonal allergic rhinitis: Secondary | ICD-10-CM | POA: Diagnosis not present

## 2023-03-07 DIAGNOSIS — R911 Solitary pulmonary nodule: Secondary | ICD-10-CM

## 2023-03-07 DIAGNOSIS — J9611 Chronic respiratory failure with hypoxia: Secondary | ICD-10-CM | POA: Diagnosis not present

## 2023-03-07 MED ORDER — TRELEGY ELLIPTA 200-62.5-25 MCG/ACT IN AEPB: 1.0000 | INHALATION_SPRAY | Freq: Every day | RESPIRATORY_TRACT | Status: DC

## 2023-03-07 MED ORDER — PREDNISONE 10 MG PO TABS: 10.0000 mg | ORAL_TABLET | Freq: Every day | ORAL | 2 refills | Status: DC

## 2023-03-07 MED ORDER — PREDNISONE 10 MG PO TABS: ORAL_TABLET | ORAL | 0 refills | Status: DC

## 2023-03-07 NOTE — Progress Notes (Unsigned)
tablets (20 mg total) by mouth daily with breakfast. resume after finishing the prednisone taper 180 tablet 3   predniSONE (DELTASONE) 10 MG tablet 4 tabs for 3 days, then 3 tabs for 3 days, 2 tabs for 3 days, then 1 tab for 3 days, then stop 30 tablet 0   roflumilast (DALIRESP) 500 MCG TABS tablet Take 1 tablet (500 mcg total) by mouth daily. 30 tablet 2   Facility-Administered Medications Prior to Visit  Medication Dose Route Frequency Provider Last Rate Last Admin   methylPREDNISolone acetate (DEPO-MEDROL) injection 80 mg  80 mg Intramuscular Once Glenford Bayley, NP         Review of Systems:   Constitutional:   No  weight loss, night sweats,  Fevers, chills, +fatigue, or  lassitude.  HEENT:   No headaches,  Difficulty swallowing,  Tooth/dental problems, or  Sore throat,                No sneezing, itching, ear ache, nasal congestion, post nasal drip,   CV:  No chest pain,  Orthopnea, PND, swelling  in lower extremities, anasarca, dizziness, palpitations, syncope.   GI  No heartburn, indigestion, abdominal pain, nausea, vomiting, diarrhea, change in bowel habits, loss of appetite, bloody stools.   Resp: .  No chest wall deformity  Skin: no rash or lesions.  GU: no dysuria, change in color of urine, no urgency or frequency.  No flank pain, no hematuria   MS:  No joint pain or swelling.  No decreased range of motion.  No back pain.    Physical Exam   GEN: A/Ox3; pleasant , NAD, well nourished on O2    HEENT:  /AT,   NOSE-clear, THROAT-clear, no lesions, no postnasal drip or exudate noted.   NECK:  Supple w/ fair ROM; no JVD; normal carotid impulses w/o bruits; no thyromegaly or nodules palpated; no lymphadenopathy.    RESP  Clear  P & A; w/o, wheezes/ rales/ or rhonchi. no accessory muscle use, no dullness to percussion  CARD:  RRR, no m/r/g, no peripheral edema, pulses intact, no cyanosis or clubbing.  GI:   Soft & nt; nml bowel sounds; no organomegaly or masses detected.   Musco: Warm bil, no deformities or joint swelling noted.   Neuro: alert, no focal deficits noted.    Skin: Warm, no lesions or rashes    Lab Results:  CBC   BMET   BNP   Imaging: ECHOCARDIOGRAM COMPLETE  Result Date: 02/13/2023    ECHOCARDIOGRAM REPORT   Patient Name:   [name] Date of Exam: 02/09/2023 Medical Rec #:  [id number]       Height:       63.0 in Accession #:    [id number]      Weight:       149.4 lb Date of Birth:  August 31, 1960       BSA:          1.708 m Patient Age:    62 years        BP:           110/70 mmHg Patient Gender: F               HR:           92 bpm. Exam Location:  [facility] Procedure: 2D Echo, 3D Echo, Cardiac Doppler and Color Doppler Indications:    Dx: Coronary artery calcification [I25.10, I25.84 (ICD-10-CM)];  tablets (20 mg total) by mouth daily with breakfast. resume after finishing the prednisone taper 180 tablet 3   predniSONE (DELTASONE) 10 MG tablet 4 tabs for 3 days, then 3 tabs for 3 days, 2 tabs for 3 days, then 1 tab for 3 days, then stop 30 tablet 0   roflumilast (DALIRESP) 500 MCG TABS tablet Take 1 tablet (500 mcg total) by mouth daily. 30 tablet 2   Facility-Administered Medications Prior to Visit  Medication Dose Route Frequency Provider Last Rate Last Admin   methylPREDNISolone acetate (DEPO-MEDROL) injection 80 mg  80 mg Intramuscular Once Glenford Bayley, NP         Review of Systems:   Constitutional:   No  weight loss, night sweats,  Fevers, chills, +fatigue, or  lassitude.  HEENT:   No headaches,  Difficulty swallowing,  Tooth/dental problems, or  Sore throat,                No sneezing, itching, ear ache, nasal congestion, post nasal drip,   CV:  No chest pain,  Orthopnea, PND, swelling  in lower extremities, anasarca, dizziness, palpitations, syncope.   GI  No heartburn, indigestion, abdominal pain, nausea, vomiting, diarrhea, change in bowel habits, loss of appetite, bloody stools.   Resp: .  No chest wall deformity  Skin: no rash or lesions.  GU: no dysuria, change in color of urine, no urgency or frequency.  No flank pain, no hematuria   MS:  No joint pain or swelling.  No decreased range of motion.  No back pain.    Physical Exam   GEN: A/Ox3; pleasant , NAD, well nourished on O2    HEENT:  /AT,   NOSE-clear, THROAT-clear, no lesions, no postnasal drip or exudate noted.   NECK:  Supple w/ fair ROM; no JVD; normal carotid impulses w/o bruits; no thyromegaly or nodules palpated; no lymphadenopathy.    RESP  Clear  P & A; w/o, wheezes/ rales/ or rhonchi. no accessory muscle use, no dullness to percussion  CARD:  RRR, no m/r/g, no peripheral edema, pulses intact, no cyanosis or clubbing.  GI:   Soft & nt; nml bowel sounds; no organomegaly or masses detected.   Musco: Warm bil, no deformities or joint swelling noted.   Neuro: alert, no focal deficits noted.    Skin: Warm, no lesions or rashes    Lab Results:  CBC   BMET   BNP   Imaging: ECHOCARDIOGRAM COMPLETE  Result Date: 02/13/2023    ECHOCARDIOGRAM REPORT   Patient Name:   [name] Date of Exam: 02/09/2023 Medical Rec #:  [id number]       Height:       63.0 in Accession #:    [id number]      Weight:       149.4 lb Date of Birth:  August 31, 1960       BSA:          1.708 m Patient Age:    62 years        BP:           110/70 mmHg Patient Gender: F               HR:           92 bpm. Exam Location:  [facility] Procedure: 2D Echo, 3D Echo, Cardiac Doppler and Color Doppler Indications:    Dx: Coronary artery calcification [I25.10, I25.84 (ICD-10-CM)];  tablets (20 mg total) by mouth daily with breakfast. resume after finishing the prednisone taper 180 tablet 3   predniSONE (DELTASONE) 10 MG tablet 4 tabs for 3 days, then 3 tabs for 3 days, 2 tabs for 3 days, then 1 tab for 3 days, then stop 30 tablet 0   roflumilast (DALIRESP) 500 MCG TABS tablet Take 1 tablet (500 mcg total) by mouth daily. 30 tablet 2   Facility-Administered Medications Prior to Visit  Medication Dose Route Frequency Provider Last Rate Last Admin   methylPREDNISolone acetate (DEPO-MEDROL) injection 80 mg  80 mg Intramuscular Once Glenford Bayley, NP         Review of Systems:   Constitutional:   No  weight loss, night sweats,  Fevers, chills, +fatigue, or  lassitude.  HEENT:   No headaches,  Difficulty swallowing,  Tooth/dental problems, or  Sore throat,                No sneezing, itching, ear ache, nasal congestion, post nasal drip,   CV:  No chest pain,  Orthopnea, PND, swelling  in lower extremities, anasarca, dizziness, palpitations, syncope.   GI  No heartburn, indigestion, abdominal pain, nausea, vomiting, diarrhea, change in bowel habits, loss of appetite, bloody stools.   Resp: .  No chest wall deformity  Skin: no rash or lesions.  GU: no dysuria, change in color of urine, no urgency or frequency.  No flank pain, no hematuria   MS:  No joint pain or swelling.  No decreased range of motion.  No back pain.    Physical Exam   GEN: A/Ox3; pleasant , NAD, well nourished on O2    HEENT:  /AT,   NOSE-clear, THROAT-clear, no lesions, no postnasal drip or exudate noted.   NECK:  Supple w/ fair ROM; no JVD; normal carotid impulses w/o bruits; no thyromegaly or nodules palpated; no lymphadenopathy.    RESP  Clear  P & A; w/o, wheezes/ rales/ or rhonchi. no accessory muscle use, no dullness to percussion  CARD:  RRR, no m/r/g, no peripheral edema, pulses intact, no cyanosis or clubbing.  GI:   Soft & nt; nml bowel sounds; no organomegaly or masses detected.   Musco: Warm bil, no deformities or joint swelling noted.   Neuro: alert, no focal deficits noted.    Skin: Warm, no lesions or rashes    Lab Results:  CBC   BMET   BNP   Imaging: ECHOCARDIOGRAM COMPLETE  Result Date: 02/13/2023    ECHOCARDIOGRAM REPORT   Patient Name:   [name] Date of Exam: 02/09/2023 Medical Rec #:  [id number]       Height:       63.0 in Accession #:    [id number]      Weight:       149.4 lb Date of Birth:  August 31, 1960       BSA:          1.708 m Patient Age:    62 years        BP:           110/70 mmHg Patient Gender: F               HR:           92 bpm. Exam Location:  [facility] Procedure: 2D Echo, 3D Echo, Cardiac Doppler and Color Doppler Indications:    Dx: Coronary artery calcification [I25.10, I25.84 (ICD-10-CM)];  @Patient  ID: [name], female    DOB: 1960-06-20, 62 y.o.   MRN: [id number]  No chief complaint on file.   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 Exposures: No mold, hot tub, Jacuzzi. Smoking history: 35-pack-year smoker.  Quit in 2011 Travel history: No significant travel history Relevant family history: No significant family history of lung disease.  Alpha 1 , MM 122.   2D echo February 09, 2023 showed EF at 60-65%, right ventricular systolic function is normal and RV size is normal,  PFTs 2019 showed severe airflow obstruction with FEV1 at 33%, ratio 51, FVC 51%, significant bronchodilator response with post bronchodilator FEV1 at 40%, +20% bronchodilator change, DLCO 32%.  CT chest August 26, 2022 showed interval improvement in the posterior lung bases with decreased nodular airspace opacities And a 15 mm right middle lobe nodule   03/07/2023 Follow up : COPD, O2 RF  Patient returns for a 6-week follow-up.  Patient has underlying very severe COPD.  l.  Patient says she had another COPD flare was treated with antibiotics and steroids last month.  Says overall she is doing some better.  Does have some intermittent wheezing and dry cough.  She remains on Trelegy inhaler daily.  Has albuterol and DuoNeb to use at home as needed.  She was started on Daliresp at last visit. Says she could not tolerate Daliresp, caused severe anxiety and insomnia . She is not taking Trelegy on regular basis .  We discussed the importance of medication compliance.  She remains on prednisone 10 mg daily. Previous CT in April showed some improvement in bibasilar nodularity.   There was a 15 mm right middle lobe nodule.  We discussed a serial CT to follow. Patient continues to try to work full-time.  He uses oxygen 24/7. Still working fulltime.  Walk test in office shows patient needs O2 at 3 L to maintain O2 saturations greater than 88 to 90%   Allergies  Allergen Reactions   Amoxicillin Shortness Of Breath   Statins Other (See Comments)    Worsened the patient's COPD   Tylenol [Acetaminophen] Shortness Of Breath   Zetia [Ezetimibe] Other (See Comments)    Worsened the patient's COPD    Immunization History  Administered Date(s) Administered   Influenza Inj Mdck Quad Pf 01/24/2017   Influenza Split 02/09/2012, 02/14/2015   Influenza Whole 12/24/2017   Influenza,inj,Quad PF,6+ Mos 01/14/2017   Influenza-Unspecified 05/16/2012, 12/31/2018   PFIZER(Purple Top)SARS-COV-2 Vaccination 08/19/2019, 09/13/2019   Pneumococcal Polysaccharide-23 09/11/2011, 04/15/2012    Past Medical History:  Diagnosis Date   Abnormal CT of the chest 12/21/2021   Acute on chronic hypoxic respiratory failure (HCC) 12/12/2022   Acute on chronic respiratory failure with hypoxia (HCC) 06/26/2021   Acute respiratory failure with hypercapnia (HCC) 08/08/2022   Acute respiratory failure with hypoxia and hypercapnia (HCC) 01/10/2022   AKI (acute kidney injury) (HCC) 08/08/2022   Aortic atherosclerosis (HCC) 05/21/2022   CAD (coronary artery disease) 02/19/2018   CAP (community acquired pneumonia) 09/09/2011   Cardiomyopathy, unspecified (HCC) 09/01/2022   Chronic headache    Chronic respiratory failure with hypoxia (HCC) 12/21/2021   COPD with acute exacerbation (HCC) 08/08/2022   COPD  @Patient  ID: [name], female    DOB: 1960-06-20, 62 y.o.   MRN: [id number]  No chief complaint on file.   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 Exposures: No mold, hot tub, Jacuzzi. Smoking history: 35-pack-year smoker.  Quit in 2011 Travel history: No significant travel history Relevant family history: No significant family history of lung disease.  Alpha 1 , MM 122.   2D echo February 09, 2023 showed EF at 60-65%, right ventricular systolic function is normal and RV size is normal,  PFTs 2019 showed severe airflow obstruction with FEV1 at 33%, ratio 51, FVC 51%, significant bronchodilator response with post bronchodilator FEV1 at 40%, +20% bronchodilator change, DLCO 32%.  CT chest August 26, 2022 showed interval improvement in the posterior lung bases with decreased nodular airspace opacities And a 15 mm right middle lobe nodule   03/07/2023 Follow up : COPD, O2 RF  Patient returns for a 6-week follow-up.  Patient has underlying very severe COPD.  l.  Patient says she had another COPD flare was treated with antibiotics and steroids last month.  Says overall she is doing some better.  Does have some intermittent wheezing and dry cough.  She remains on Trelegy inhaler daily.  Has albuterol and DuoNeb to use at home as needed.  She was started on Daliresp at last visit. Says she could not tolerate Daliresp, caused severe anxiety and insomnia . She is not taking Trelegy on regular basis .  We discussed the importance of medication compliance.  She remains on prednisone 10 mg daily. Previous CT in April showed some improvement in bibasilar nodularity.   There was a 15 mm right middle lobe nodule.  We discussed a serial CT to follow. Patient continues to try to work full-time.  He uses oxygen 24/7. Still working fulltime.  Walk test in office shows patient needs O2 at 3 L to maintain O2 saturations greater than 88 to 90%   Allergies  Allergen Reactions   Amoxicillin Shortness Of Breath   Statins Other (See Comments)    Worsened the patient's COPD   Tylenol [Acetaminophen] Shortness Of Breath   Zetia [Ezetimibe] Other (See Comments)    Worsened the patient's COPD    Immunization History  Administered Date(s) Administered   Influenza Inj Mdck Quad Pf 01/24/2017   Influenza Split 02/09/2012, 02/14/2015   Influenza Whole 12/24/2017   Influenza,inj,Quad PF,6+ Mos 01/14/2017   Influenza-Unspecified 05/16/2012, 12/31/2018   PFIZER(Purple Top)SARS-COV-2 Vaccination 08/19/2019, 09/13/2019   Pneumococcal Polysaccharide-23 09/11/2011, 04/15/2012    Past Medical History:  Diagnosis Date   Abnormal CT of the chest 12/21/2021   Acute on chronic hypoxic respiratory failure (HCC) 12/12/2022   Acute on chronic respiratory failure with hypoxia (HCC) 06/26/2021   Acute respiratory failure with hypercapnia (HCC) 08/08/2022   Acute respiratory failure with hypoxia and hypercapnia (HCC) 01/10/2022   AKI (acute kidney injury) (HCC) 08/08/2022   Aortic atherosclerosis (HCC) 05/21/2022   CAD (coronary artery disease) 02/19/2018   CAP (community acquired pneumonia) 09/09/2011   Cardiomyopathy, unspecified (HCC) 09/01/2022   Chronic headache    Chronic respiratory failure with hypoxia (HCC) 12/21/2021   COPD with acute exacerbation (HCC) 08/08/2022   COPD  tablets (20 mg total) by mouth daily with breakfast. resume after finishing the prednisone taper 180 tablet 3   predniSONE (DELTASONE) 10 MG tablet 4 tabs for 3 days, then 3 tabs for 3 days, 2 tabs for 3 days, then 1 tab for 3 days, then stop 30 tablet 0   roflumilast (DALIRESP) 500 MCG TABS tablet Take 1 tablet (500 mcg total) by mouth daily. 30 tablet 2   Facility-Administered Medications Prior to Visit  Medication Dose Route Frequency Provider Last Rate Last Admin   methylPREDNISolone acetate (DEPO-MEDROL) injection 80 mg  80 mg Intramuscular Once Glenford Bayley, NP         Review of Systems:   Constitutional:   No  weight loss, night sweats,  Fevers, chills, +fatigue, or  lassitude.  HEENT:   No headaches,  Difficulty swallowing,  Tooth/dental problems, or  Sore throat,                No sneezing, itching, ear ache, nasal congestion, post nasal drip,   CV:  No chest pain,  Orthopnea, PND, swelling  in lower extremities, anasarca, dizziness, palpitations, syncope.   GI  No heartburn, indigestion, abdominal pain, nausea, vomiting, diarrhea, change in bowel habits, loss of appetite, bloody stools.   Resp: .  No chest wall deformity  Skin: no rash or lesions.  GU: no dysuria, change in color of urine, no urgency or frequency.  No flank pain, no hematuria   MS:  No joint pain or swelling.  No decreased range of motion.  No back pain.    Physical Exam   GEN: A/Ox3; pleasant , NAD, well nourished on O2    HEENT:  /AT,   NOSE-clear, THROAT-clear, no lesions, no postnasal drip or exudate noted.   NECK:  Supple w/ fair ROM; no JVD; normal carotid impulses w/o bruits; no thyromegaly or nodules palpated; no lymphadenopathy.    RESP  Clear  P & A; w/o, wheezes/ rales/ or rhonchi. no accessory muscle use, no dullness to percussion  CARD:  RRR, no m/r/g, no peripheral edema, pulses intact, no cyanosis or clubbing.  GI:   Soft & nt; nml bowel sounds; no organomegaly or masses detected.   Musco: Warm bil, no deformities or joint swelling noted.   Neuro: alert, no focal deficits noted.    Skin: Warm, no lesions or rashes    Lab Results:  CBC   BMET   BNP   Imaging: ECHOCARDIOGRAM COMPLETE  Result Date: 02/13/2023    ECHOCARDIOGRAM REPORT   Patient Name:   [name] Date of Exam: 02/09/2023 Medical Rec #:  [id number]       Height:       63.0 in Accession #:    [id number]      Weight:       149.4 lb Date of Birth:  August 31, 1960       BSA:          1.708 m Patient Age:    62 years        BP:           110/70 mmHg Patient Gender: F               HR:           92 bpm. Exam Location:  [facility] Procedure: 2D Echo, 3D Echo, Cardiac Doppler and Color Doppler Indications:    Dx: Coronary artery calcification [I25.10, I25.84 (ICD-10-CM)];

## 2023-03-07 NOTE — Patient Instructions (Addendum)
Set up CT chest follow lung nodule Restart Trelegy inhaler 1 puff daily, rinse after use- Important to take everyday  Albuterol inhaler or Duoneb As needed   Prednisone taper over next week than resume prednisone 10mg  daily  Continue on Oxygen 3l/m  Follow up with Dr. Isaiah Serge in 3 months with PFT and As needed   Please contact office for sooner follow up if symptoms do not improve or worsen or seek emergency care

## 2023-03-09 NOTE — Assessment & Plan Note (Signed)
Continue on current regimen .   

## 2023-03-09 NOTE — Assessment & Plan Note (Signed)
Slow to resolve COPD exacerbation.  Will treat with a prednisone burst and then resume daily dosing at 10 mg daily.  Patient is encouraged on medication compliance is instructed to take Trelegy every day.  Plan  Patient Instructions  Set up CT chest follow lung nodule Restart Trelegy inhaler 1 puff daily, rinse after use- Important to take everyday  Albuterol inhaler or Duoneb As needed   Prednisone taper over next week than resume prednisone 10mg  daily  Continue on Oxygen 3l/m  Follow up with Dr. Isaiah Serge in 3 months with PFT and As needed   Please contact office for sooner follow up if symptoms do not improve or worsen or seek emergency care

## 2023-03-09 NOTE — Assessment & Plan Note (Addendum)
Continue on oxygen to maintain O2 saturations greater than 88 to 90%. 

## 2023-03-09 NOTE — Assessment & Plan Note (Signed)
CT chest April 2024 showed decreased nodularity in lung bases.  15 mm right middle lobe nodule.  Needs serial CT to follow

## 2023-03-17 ENCOUNTER — Other Ambulatory Visit: Payer: Self-pay | Admitting: Pulmonary Disease

## 2023-03-18 ENCOUNTER — Other Ambulatory Visit: Payer: Self-pay | Admitting: Primary Care

## 2023-03-21 ENCOUNTER — Other Ambulatory Visit: Payer: BC Managed Care – PPO

## 2023-03-22 ENCOUNTER — Telehealth: Payer: Self-pay | Admitting: Internal Medicine

## 2023-03-22 ENCOUNTER — Other Ambulatory Visit (HOSPITAL_COMMUNITY): Payer: Self-pay

## 2023-03-22 ENCOUNTER — Telehealth: Payer: Self-pay

## 2023-03-22 NOTE — Telephone Encounter (Signed)
PT is calling about Albuterol Solution (See last signed encounter also) She states she was told to take it every 4 hours by Dr. Lenon Curt, instead of every 6 hours so she needs the RX to be re-written.  She also needs a refill of Omeprazol.  When calling back explain to her the PA Dept's notes. She was not expressing understanding when I did. Her # is [phone]  Ilda Basset is [facility]. She is out.

## 2023-03-22 NOTE — Telephone Encounter (Signed)
*  [facility]  Pharmacy Patient Advocate Encounter   Received notification from CoverMyMeds that prior authorization for Ipratropium-Albuterol 0.5-2.5 (3)MG/3ML solution  is required/requested.   Insurance verification completed.   The patient is insured through [facility] .   Per test claim:  Bill to Medicare Part B is preferred by the insurance.  If suggested medication is appropriate, Please send in a new RX and discontinue this one. If not, please advise as to why it's not appropriate so that we may request a Prior Authorization.   *we also show other coverage through [facility] which is showing a $7.00 co-pay

## 2023-03-24 MED ORDER — IPRATROPIUM-ALBUTEROL 0.5-2.5 (3) MG/3ML IN SOLN: 3.0000 mL | Freq: Four times a day (QID) | RESPIRATORY_TRACT | 3 refills | Status: DC | PRN

## 2023-03-24 NOTE — Telephone Encounter (Signed)
PT calling again for Albuterol solution. She is going out of town and needs [facility] because [facility]   [facility] [address]

## 2023-03-24 NOTE — Telephone Encounter (Signed)
Medication has been sent, pt advised nfn

## 2023-03-27 ENCOUNTER — Ambulatory Visit
Admission: RE | Admit: 2023-03-27 | Discharge: 2023-03-27 | Disposition: A | Discharge status: Home / Self Care | Payer: BC Managed Care – PPO | Source: Ambulatory Visit | Attending: Adult Health | Admitting: Adult Health

## 2023-03-27 ENCOUNTER — Telehealth: Payer: Self-pay | Admitting: Pulmonary Disease

## 2023-03-27 DIAGNOSIS — R911 Solitary pulmonary nodule: Secondary | ICD-10-CM

## 2023-03-27 NOTE — Telephone Encounter (Signed)
Patient states needs only Albuterol Solution. Patient out of medication. States Ipratropium was called into pharmacy. Needs for every 4 hours. Pharmacy is [facility] order. Patient phone number is [phone].

## 2023-03-27 NOTE — Telephone Encounter (Signed)
Called and spoke to patient.  She is requesting Rx for albuterol solution. She stated that she can not tolerate duoneb with trelegy. She stated that duoneb causes dizziness and she feels like "she is going to fall out".  Dr. Isaiah Serge, please advise if okay to d/c duoneb and send in albuterol solution. Thanks

## 2023-03-30 MED ORDER — ALBUTEROL SULFATE (2.5 MG/3ML) 0.083% IN NEBU: 2.5000 mg | INHALATION_SOLUTION | Freq: Four times a day (QID) | RESPIRATORY_TRACT | 12 refills | Status: DC | PRN

## 2023-03-30 NOTE — Telephone Encounter (Signed)
I have sent in the order for albuterol neb and patient has been informed. Nothing further needed.

## 2023-04-05 ENCOUNTER — Telehealth: Payer: Self-pay | Admitting: Pulmonary Disease

## 2023-04-05 MED ORDER — ALBUTEROL SULFATE (2.5 MG/3ML) 0.083% IN NEBU: 2.5000 mg | INHALATION_SOLUTION | Freq: Four times a day (QID) | RESPIRATORY_TRACT | 12 refills | Status: DC | PRN

## 2023-04-05 NOTE — Telephone Encounter (Signed)
PT calling upset [facility] does not have her RX for Albuterol solution. She was accusing [place] of not calling it in and now she is out. Wanted to speak to Production designer, theatre/television/film. I assured her Dr. Isaiah Serge had sent it in and told her we'd work with her to resolve ASAP.  She now needs it called in to [facility] on [address]. She wants it to be as follows:  "Every 4 hours and 90 day supply."

## 2023-04-05 NOTE — Telephone Encounter (Signed)
I called and spoke with [facility]  They confirmed that a 90 day supply of albuterol neb sol was shipped to the pt on 03/31/23  I called and spoke with the pt and notified her of this  She verbalized understanding  I have also sent rx to the local pharm of her choice per per request since she is out Nothing further needed

## 2023-04-10 ENCOUNTER — Telehealth: Payer: Self-pay | Admitting: Pulmonary Disease

## 2023-04-10 NOTE — Telephone Encounter (Signed)
Called and spoke with pt, Trelegy samples will be left at the front. Pt verbalized understanding nfn.

## 2023-04-10 NOTE — Telephone Encounter (Signed)
Trelegy is too expensive for patient and she will like samples

## 2023-04-14 ENCOUNTER — Other Ambulatory Visit: Payer: Self-pay

## 2023-04-14 ENCOUNTER — Other Ambulatory Visit (HOSPITAL_COMMUNITY): Payer: Self-pay

## 2023-04-14 ENCOUNTER — Inpatient Hospital Stay (HOSPITAL_COMMUNITY)
Admission: EM | Admit: 2023-04-14 | Discharge: 2023-04-17 | DRG: 871 | Disposition: A | Discharge status: Home / Self Care | Payer: BC Managed Care – PPO | Source: Ambulatory Visit | Attending: Internal Medicine | Admitting: Internal Medicine

## 2023-04-14 ENCOUNTER — Emergency Department (HOSPITAL_COMMUNITY): Payer: BC Managed Care – PPO

## 2023-04-14 ENCOUNTER — Encounter (HOSPITAL_COMMUNITY): Payer: Self-pay | Admitting: Pharmacy Technician

## 2023-04-14 DIAGNOSIS — J441 Chronic obstructive pulmonary disease with (acute) exacerbation: Secondary | ICD-10-CM | POA: Diagnosis present

## 2023-04-14 DIAGNOSIS — Z7951 Long term (current) use of inhaled steroids: Secondary | ICD-10-CM

## 2023-04-14 DIAGNOSIS — I2510 Atherosclerotic heart disease of native coronary artery without angina pectoris: Secondary | ICD-10-CM | POA: Diagnosis present

## 2023-04-14 DIAGNOSIS — J9621 Acute and chronic respiratory failure with hypoxia: Secondary | ICD-10-CM | POA: Diagnosis present

## 2023-04-14 DIAGNOSIS — J439 Emphysema, unspecified: Secondary | ICD-10-CM | POA: Diagnosis present

## 2023-04-14 DIAGNOSIS — Z886 Allergy status to analgesic agent status: Secondary | ICD-10-CM

## 2023-04-14 DIAGNOSIS — I10 Essential (primary) hypertension: Secondary | ICD-10-CM | POA: Diagnosis present

## 2023-04-14 DIAGNOSIS — Z1152 Encounter for screening for COVID-19: Secondary | ICD-10-CM | POA: Diagnosis not present

## 2023-04-14 DIAGNOSIS — R0902 Hypoxemia: Secondary | ICD-10-CM

## 2023-04-14 DIAGNOSIS — I429 Cardiomyopathy, unspecified: Secondary | ICD-10-CM | POA: Diagnosis present

## 2023-04-14 DIAGNOSIS — J440 Chronic obstructive pulmonary disease with acute lower respiratory infection: Secondary | ICD-10-CM | POA: Diagnosis present

## 2023-04-14 DIAGNOSIS — Z79899 Other long term (current) drug therapy: Secondary | ICD-10-CM | POA: Diagnosis not present

## 2023-04-14 DIAGNOSIS — Z7952 Long term (current) use of systemic steroids: Secondary | ICD-10-CM

## 2023-04-14 DIAGNOSIS — R0603 Acute respiratory distress: Secondary | ICD-10-CM

## 2023-04-14 DIAGNOSIS — Z9981 Dependence on supplemental oxygen: Secondary | ICD-10-CM

## 2023-04-14 DIAGNOSIS — R0689 Other abnormalities of breathing: Secondary | ICD-10-CM

## 2023-04-14 DIAGNOSIS — K219 Gastro-esophageal reflux disease without esophagitis: Secondary | ICD-10-CM | POA: Diagnosis present

## 2023-04-14 DIAGNOSIS — H9190 Unspecified hearing loss, unspecified ear: Secondary | ICD-10-CM | POA: Diagnosis present

## 2023-04-14 DIAGNOSIS — Z87891 Personal history of nicotine dependence: Secondary | ICD-10-CM | POA: Diagnosis not present

## 2023-04-14 DIAGNOSIS — Z8249 Family history of ischemic heart disease and other diseases of the circulatory system: Secondary | ICD-10-CM

## 2023-04-14 DIAGNOSIS — G4700 Insomnia, unspecified: Secondary | ICD-10-CM | POA: Diagnosis present

## 2023-04-14 DIAGNOSIS — F1410 Cocaine abuse, uncomplicated: Secondary | ICD-10-CM | POA: Diagnosis present

## 2023-04-14 DIAGNOSIS — Z8616 Personal history of COVID-19: Secondary | ICD-10-CM

## 2023-04-14 DIAGNOSIS — Z880 Allergy status to penicillin: Secondary | ICD-10-CM

## 2023-04-14 DIAGNOSIS — J189 Pneumonia, unspecified organism: Secondary | ICD-10-CM | POA: Diagnosis present

## 2023-04-14 DIAGNOSIS — Z888 Allergy status to other drugs, medicaments and biological substances status: Secondary | ICD-10-CM

## 2023-04-14 DIAGNOSIS — E785 Hyperlipidemia, unspecified: Secondary | ICD-10-CM | POA: Diagnosis present

## 2023-04-14 DIAGNOSIS — A419 Sepsis, unspecified organism: Principal | ICD-10-CM | POA: Diagnosis present

## 2023-04-14 DIAGNOSIS — Z825 Family history of asthma and other chronic lower respiratory diseases: Secondary | ICD-10-CM

## 2023-04-14 DIAGNOSIS — J9622 Acute and chronic respiratory failure with hypercapnia: Secondary | ICD-10-CM | POA: Diagnosis present

## 2023-04-14 DIAGNOSIS — Z823 Family history of stroke: Secondary | ICD-10-CM

## 2023-04-14 DIAGNOSIS — R739 Hyperglycemia, unspecified: Secondary | ICD-10-CM | POA: Diagnosis present

## 2023-04-14 LAB — COMPREHENSIVE METABOLIC PANEL
ALT: 18 U/L (ref 0–44)
AST: 19 U/L (ref 15–41)
Albumin: 3.9 g/dL (ref 3.5–5.0)
Alkaline Phosphatase: 54 U/L (ref 38–126)
Anion gap: 9 (ref 5–15)
BUN: 12 mg/dL (ref 8–23)
CO2: 29 mmol/L (ref 22–32)
Calcium: 9.2 mg/dL (ref 8.9–10.3)
Chloride: 104 mmol/L (ref 98–111)
Creatinine, Ser: 0.83 mg/dL (ref 0.44–1.00)
GFR, Estimated: >60 mL/min (ref >60)
Glucose, Bld: 80 mg/dL (ref 70–99)
Potassium: 3.6 mmol/L (ref 3.5–5.1)
Sodium: 142 mmol/L (ref 135–145)
Total Bilirubin: 0.7 mg/dL (ref <1.2)
Total Protein: 6.6 g/dL (ref 6.5–8.1)

## 2023-04-14 LAB — I-STAT VENOUS BLOOD GAS, ED
Acid-Base Excess: 6 mmol/L — ABNORMAL HIGH (ref 0.0–2.0)
Bicarbonate: 34.5 mmol/L — ABNORMAL HIGH (ref 20.0–28.0)
Calcium, Ion: 1.22 mmol/L (ref 1.15–1.40)
HCT: 40 % (ref 36.0–46.0)
Hemoglobin: 13.6 g/dL (ref 12.0–15.0)
O2 Saturation: 83 %
Potassium: 3.5 mmol/L (ref 3.5–5.1)
Sodium: 142 mmol/L (ref 135–145)
TCO2: 37 mmol/L — ABNORMAL HIGH (ref 22–32)
pCO2, Ven: 71 mm[Hg] (ref 44–60)
pH, Ven: 7.295 (ref 7.25–7.43)
pO2, Ven: 56 mm[Hg] — ABNORMAL HIGH (ref 32–45)

## 2023-04-14 LAB — TROPONIN I (HIGH SENSITIVITY)
Troponin I (High Sensitivity): 6 ng/L (ref <18)
Troponin I (High Sensitivity): 7 ng/L (ref <18)

## 2023-04-14 LAB — CBC
HCT: 41.1 % (ref 36.0–46.0)
Hemoglobin: 12.6 g/dL (ref 12.0–15.0)
MCH: 28.8 pg (ref 26.0–34.0)
MCHC: 30.7 g/dL (ref 30.0–36.0)
MCV: 94.1 fL (ref 80.0–100.0)
Platelets: 243 10*3/uL (ref 150–400)
RBC: 4.37 MIL/uL (ref 3.87–5.11)
RDW: 13.6 % (ref 11.5–15.5)
WBC: 10.6 10*3/uL — ABNORMAL HIGH (ref 4.0–10.5)
nRBC: 0 % (ref 0.0–0.2)

## 2023-04-14 LAB — HEMOGLOBIN A1C
Hgb A1c MFr Bld: 6.3 % — ABNORMAL HIGH (ref 4.8–5.6)
Mean Plasma Glucose: 134.11 mg/dL

## 2023-04-14 LAB — RESP PANEL BY RT-PCR (RSV, FLU A&B, COVID)  RVPGX2
Influenza A by PCR: NEGATIVE
Influenza B by PCR: NEGATIVE
Resp Syncytial Virus by PCR: NEGATIVE
SARS Coronavirus 2 by RT PCR: NEGATIVE

## 2023-04-14 LAB — BRAIN NATRIURETIC PEPTIDE: B Natriuretic Peptide: 62.6 pg/mL (ref 0.0–100.0)

## 2023-04-14 LAB — HIV ANTIBODY (ROUTINE TESTING W REFLEX): HIV Screen 4th Generation wRfx: NONREACTIVE

## 2023-04-14 LAB — MRSA NEXT GEN BY PCR, NASAL: MRSA by PCR Next Gen: NOT DETECTED

## 2023-04-14 LAB — GLUCOSE, CAPILLARY
Glucose-Capillary: 128 mg/dL — ABNORMAL HIGH (ref 70–99)
Glucose-Capillary: 182 mg/dL — ABNORMAL HIGH (ref 70–99)

## 2023-04-14 LAB — I-STAT CG4 LACTIC ACID, ED
Lactic Acid, Venous: 1 mmol/L (ref 0.5–1.9)
Lactic Acid, Venous: 1.6 mmol/L (ref 0.5–1.9)

## 2023-04-14 MED ORDER — ORAL CARE MOUTH RINSE: 15.0000 mL | OROMUCOSAL | Status: DC | PRN

## 2023-04-14 MED ORDER — SODIUM CHLORIDE 0.9 % IV SOLN
2.0000 g | Freq: Once | INTRAVENOUS | Status: AC
  Administered 2023-04-14: 2 g via INTRAVENOUS
  Filled 2023-04-14: qty 20

## 2023-04-14 MED ORDER — INSULIN ASPART 100 UNIT/ML IJ SOLN: 0.0000 [IU] | Freq: Every day | INTRAMUSCULAR | Status: DC

## 2023-04-14 MED ORDER — ALBUTEROL SULFATE (2.5 MG/3ML) 0.083% IN NEBU
INHALATION_SOLUTION | RESPIRATORY_TRACT | Status: AC
  Administered 2023-04-14: 15 mg/h via RESPIRATORY_TRACT
  Filled 2023-04-14: qty 18

## 2023-04-14 MED ORDER — SODIUM CHLORIDE 0.9 % IV SOLN
500.0000 mg | INTRAVENOUS | Status: AC
  Administered 2023-04-15 – 2023-04-16 (×2): 500 mg via INTRAVENOUS
  Filled 2023-04-14 (×2): qty 5

## 2023-04-14 MED ORDER — POLYETHYLENE GLYCOL 3350 17 G PO PACK: 17.0000 g | PACK | Freq: Every day | ORAL | Status: DC | PRN

## 2023-04-14 MED ORDER — PANTOPRAZOLE SODIUM 40 MG PO TBEC
40.0000 mg | DELAYED_RELEASE_TABLET | Freq: Every day | ORAL | Status: DC
  Administered 2023-04-15 – 2023-04-17 (×3): 40 mg via ORAL
  Filled 2023-04-14 (×3): qty 1

## 2023-04-14 MED ORDER — IPRATROPIUM-ALBUTEROL 0.5-2.5 (3) MG/3ML IN SOLN
3.0000 mL | RESPIRATORY_TRACT | Status: DC | PRN
  Administered 2023-04-14 – 2023-04-17 (×4): 3 mL via RESPIRATORY_TRACT
  Filled 2023-04-14 (×4): qty 3

## 2023-04-14 MED ORDER — SODIUM CHLORIDE 0.9 % IV SOLN
500.0000 mg | Freq: Once | INTRAVENOUS | Status: AC
  Administered 2023-04-14: 500 mg via INTRAVENOUS
  Filled 2023-04-14: qty 5

## 2023-04-14 MED ORDER — DOCUSATE SODIUM 100 MG PO CAPS: 100.0000 mg | ORAL_CAPSULE | Freq: Two times a day (BID) | ORAL | Status: DC | PRN

## 2023-04-14 MED ORDER — BUDESONIDE 0.25 MG/2ML IN SUSP
0.2500 mg | Freq: Two times a day (BID) | RESPIRATORY_TRACT | Status: DC
  Administered 2023-04-15 – 2023-04-17 (×5): 0.25 mg via RESPIRATORY_TRACT
  Filled 2023-04-14 (×4): qty 2

## 2023-04-14 MED ORDER — ONDANSETRON HCL 4 MG/2ML IJ SOLN: 4.0000 mg | Freq: Four times a day (QID) | INTRAMUSCULAR | Status: DC | PRN

## 2023-04-14 MED ORDER — LACTATED RINGERS IV BOLUS
500.0000 mL | Freq: Once | INTRAVENOUS | Status: AC
  Administered 2023-04-14: 500 mL via INTRAVENOUS

## 2023-04-14 MED ORDER — LACTATED RINGERS IV BOLUS: 1000.0000 mL | Freq: Once | INTRAVENOUS | Status: DC

## 2023-04-14 MED ORDER — MIDAZOLAM HCL 2 MG/2ML IJ SOLN
2.0000 mg | Freq: Once | INTRAMUSCULAR | Status: AC
  Administered 2023-04-14: 2 mg via INTRAVENOUS
  Filled 2023-04-14: qty 2

## 2023-04-14 MED ORDER — ENOXAPARIN SODIUM 40 MG/0.4ML IJ SOSY
40.0000 mg | PREFILLED_SYRINGE | INTRAMUSCULAR | Status: DC
  Administered 2023-04-14 – 2023-04-16 (×3): 40 mg via SUBCUTANEOUS
  Filled 2023-04-14 (×3): qty 0.4

## 2023-04-14 MED ORDER — ALBUTEROL SULFATE (2.5 MG/3ML) 0.083% IN NEBU: 15.0000 mg/h | INHALATION_SOLUTION | Freq: Once | RESPIRATORY_TRACT | Status: AC

## 2023-04-14 MED ORDER — METHYLPREDNISOLONE SODIUM SUCC 125 MG IJ SOLR
125.0000 mg | INTRAMUSCULAR | Status: AC
  Administered 2023-04-14: 125 mg via INTRAVENOUS
  Filled 2023-04-14: qty 2

## 2023-04-14 MED ORDER — REVEFENACIN 175 MCG/3ML IN SOLN
175.0000 ug | Freq: Every day | RESPIRATORY_TRACT | Status: DC
  Administered 2023-04-15 – 2023-04-17 (×3): 175 ug via RESPIRATORY_TRACT
  Filled 2023-04-14 (×3): qty 3

## 2023-04-14 MED ORDER — METHYLPREDNISOLONE SODIUM SUCC 40 MG IJ SOLR
40.0000 mg | Freq: Every day | INTRAMUSCULAR | Status: DC
  Administered 2023-04-15 – 2023-04-16 (×2): 40 mg via INTRAVENOUS
  Filled 2023-04-14 (×2): qty 1

## 2023-04-14 MED ORDER — MAGNESIUM SULFATE 2 GM/50ML IV SOLN
2.0000 g | Freq: Once | INTRAVENOUS | Status: AC
Start: 2023-04-14 — End: 2023-04-14
  Administered 2023-04-14: 2 g via INTRAVENOUS
  Filled 2023-04-14: qty 50

## 2023-04-14 MED ORDER — ORAL CARE MOUTH RINSE
15.0000 mL | OROMUCOSAL | Status: DC
  Administered 2023-04-15 – 2023-04-16 (×3): 15 mL via OROMUCOSAL

## 2023-04-14 MED ORDER — ARFORMOTEROL TARTRATE 15 MCG/2ML IN NEBU
15.0000 ug | INHALATION_SOLUTION | Freq: Two times a day (BID) | RESPIRATORY_TRACT | Status: DC
  Administered 2023-04-15 – 2023-04-17 (×5): 15 ug via RESPIRATORY_TRACT
  Filled 2023-04-14 (×5): qty 2

## 2023-04-14 MED ORDER — SODIUM CHLORIDE 0.9 % IV SOLN
2.0000 g | INTRAVENOUS | Status: DC
  Administered 2023-04-15 – 2023-04-17 (×3): 2 g via INTRAVENOUS
  Filled 2023-04-14 (×3): qty 20

## 2023-04-14 MED ORDER — INSULIN ASPART 100 UNIT/ML IJ SOLN
0.0000 [IU] | Freq: Three times a day (TID) | INTRAMUSCULAR | Status: DC
  Administered 2023-04-14: 1 [IU] via SUBCUTANEOUS
  Administered 2023-04-15 (×2): 2 [IU] via SUBCUTANEOUS
  Administered 2023-04-16: 1 [IU] via SUBCUTANEOUS
  Administered 2023-04-17: 2 [IU] via SUBCUTANEOUS

## 2023-04-14 NOTE — Progress Notes (Signed)
Pt taken off bipap an placed on 3L [place] by RT. Pt tolerating well at this time, no increased WOB noted, Vitals stable, SPO2 94%,RN at bedside, RT will monitor as needed.      04/14/23 1501  Therapy Vitals  Pulse Rate 95  Resp (!) 24  BP 129/76  MEWS Score/Color  MEWS Score 3  MEWS Score Color Yellow  Respiratory Assessment  Assessment Type Assess only  Respiratory Pattern Regular;Unlabored  Chest Assessment Chest expansion symmetrical  Cough None  Bilateral Breath Sounds Diminished  Oxygen Therapy/Pulse Ox  O2 Device (S)  Nasal Cannula  O2 Therapy Oxygen  FiO2 (%) 40 %  SpO2 94 %
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
## 2023-04-14 NOTE — Progress Notes (Signed)
Pt transported on Bipap from ED to 2M06 without any complications. RN X2 at bedside, RT will monitor as need.

## 2023-04-14 NOTE — Plan of Care (Signed)
  Problem: Coping: Goal: Ability to adjust to condition or change in health will improve Outcome: Progressing   Problem: Fluid Volume: Goal: Ability to maintain a balanced intake and output will improve Outcome: Progressing   Problem: Metabolic: Goal: Ability to maintain appropriate glucose levels will improve Outcome: Progressing   Problem: Nutritional: Goal: Maintenance of adequate nutrition will improve Outcome: Progressing   Problem: Skin Integrity: Goal: Risk for impaired skin integrity will decrease Outcome: Progressing   Problem: Tissue Perfusion: Goal: Adequacy of tissue perfusion will improve Outcome: Progressing   Problem: Clinical Measurements: Goal: Will remain free from infection Outcome: Progressing Goal: Diagnostic test results will improve Outcome: Progressing Goal: Respiratory complications will improve Outcome: Progressing Goal: Cardiovascular complication will be avoided Outcome: Progressing

## 2023-04-14 NOTE — ED Triage Notes (Signed)
Pt bib ems from MD office with respiratory distress. Initially room air saturation 55%, pt with accessory muscle use and respiratory rate in the 40's with +JVD. Pt placed on CPAP, given 10mg  albuterol and 2 duonebs en route. Pt with labored breathing on arrival. Pt has also had a fever for the last few days.

## 2023-04-14 NOTE — Progress Notes (Signed)
Patient arrived by EMS on CPAP.  Patient taken off of CPAP and placed on our Bipap machine.  Tolerating well at this time.  Will continue to monitor.
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
## 2023-04-14 NOTE — ED Provider Notes (Signed)
[facility] AT [facility] Provider Note   CSN: [id number] Arrival date & time: 04/14/23  1140     History  Chief Complaint  Patient presents with   Respiratory Distress    [name] is a 62 y.o. female.  62 year old female with a history of COPD on 5 L home oxygen, hypertension, CAD, and cocaine abuse who presents to the emergency department in respiratory distress.  Patient went to an outpatient visit today and was found to be hypoxic and in respiratory distress.  When EMS arrived they placed her on BiPAP and gave her several rounds of DuoNeb.  No steroids.  EMS also says that she was febrile to 103F.  Thinks that she may have a pneumonia and could potentially have been on antibiotics.  She says that she has been sick for several days.  Also having some mild left-sided chest pain.  Unsure of intubations for her COPD.  History limited due to respiratory distress.       Home Medications Prior to Admission medications   Medication Sig Start Date End Date Taking? Authorizing Provider  albuterol (PROVENTIL) (2.5 MG/3ML) 0.083% nebulizer solution Take 3 mLs (2.5 mg total) by nebulization every 6 (six) hours as needed for wheezing or shortness of breath. 04/05/23  Yes Mannam, Praveen, MD  albuterol (VENTOLIN HFA) 108 (90 Base) MCG/ACT inhaler INHALE 2 PUFFS INTO THE LUNGS EVERY 4 HOURS AS NEEDED FOR WHEEZING OR SHORTNESS OF BREATH. 03/29/23  Yes Mannam, Praveen, MD  Fluticasone-Umeclidin-Vilant (TRELEGY ELLIPTA) 200-62.5-25 MCG/ACT AEPB Inhale 1 puff into the lungs daily. 03/07/23  Yes Parrett, Tammy S, NP  Ibuprofen (IBU-200 PO) Take 200-400 mg by mouth every 6 (six) hours as needed (for pain or headaches).   Yes [provider]  ipratropium-albuterol (DUONEB) 0.5-2.5 (3) MG/3ML SOLN Take 3 mLs by nebulization every 6 (six) hours as needed. 03/24/23  Yes Kalman Shan, MD  meclizine (ANTIVERT) 12.5 MG tablet Take 12.5-25 mg by mouth 3  (three) times daily as needed. 03/29/23  Yes [provider]  omeprazole (PRILOSEC) 20 MG capsule TAKE 1 CAPSULE BY MOUTH EVERY DAY 03/24/23  Yes Glenford Bayley, NP  OXYGEN Inhale 5 L/min into the lungs See admin instructions. 5 L/min at bedtime and as needed for shortness of breath throughout the daytime   Yes [provider]  predniSONE (DELTASONE) 10 MG tablet Take 1 tablet (10 mg total) by mouth daily with breakfast. 03/07/23  Yes Parrett, Tammy S, NP  Alirocumab (PRALUENT) 150 MG/ML SOAJ Inject 1 mL (150 mg total) into the skin every 14 (fourteen) days. Patient taking differently: Inject 150 mg into the skin every 14 (fourteen) days. Last dose was  04/07/2023. 12/05/22   Revankar, Aundra Dubin, MD      Allergies    Amoxicillin, Statins, Tylenol [acetaminophen], and Zetia [ezetimibe]    Review of Systems   Review of Systems  Physical Exam Updated Vital Signs BP (!) 115/97   Pulse (!) 105   Temp (!) 101.7 F (38.7 C) (Axillary)   Resp (!) 27   SpO2 100%  Physical Exam Vitals and nursing note reviewed.  Constitutional:      General: She is in acute distress.     Appearance: She is well-developed. She is ill-appearing.  HENT:     Head: Normocephalic and atraumatic.     Right Ear: External ear normal.     Left Ear: External ear normal.     Nose: Nose normal.  Eyes:  Extraocular Movements: Extraocular movements intact.     Conjunctiva/sclera: Conjunctivae normal.     Pupils: Pupils are equal, round, and reactive to light.  Cardiovascular:     Rate and Rhythm: Normal rate and regular rhythm.     Heart sounds: No murmur heard. Pulmonary:     Effort: Respiratory distress present.     Comments: On BiPAP.  Poor air movement bilaterally Abdominal:     General: Abdomen is flat. There is no distension.     Palpations: Abdomen is soft. There is no mass.     Tenderness: There is no abdominal tenderness. There is no guarding.  Musculoskeletal:     Cervical back:  Normal range of motion and neck supple.     Right lower leg: No edema.     Left lower leg: No edema.  Skin:    General: Skin is warm and dry.  Neurological:     Mental Status: She is alert and oriented to person, place, and time. Mental status is at baseline.  Psychiatric:        Mood and Affect: Mood normal.     ED Results / Procedures / Treatments   Labs (all labs ordered are listed, but only abnormal results are displayed) Labs Reviewed  CBC - Abnormal; Notable for the following components:      Result Value   WBC 10.6 (*)    All other components within normal limits  I-STAT VENOUS BLOOD GAS, ED - Abnormal; Notable for the following components:   pCO2, Ven 71.0 (*)    pO2, Ven 56 (*)    Bicarbonate 34.5 (*)    TCO2 37 (*)    Acid-Base Excess 6.0 (*)    All other components within normal limits  RESP PANEL BY RT-PCR (RSV, FLU A&B, COVID)  RVPGX2  CULTURE, BLOOD (ROUTINE X 2)  CULTURE, BLOOD (ROUTINE X 2)  COMPREHENSIVE METABOLIC PANEL  BRAIN NATRIURETIC PEPTIDE  HIV ANTIBODY (ROUTINE TESTING W REFLEX)  HEMOGLOBIN A1C  I-STAT CG4 LACTIC ACID, ED  I-STAT CG4 LACTIC ACID, ED  TROPONIN I (HIGH SENSITIVITY)  TROPONIN I (HIGH SENSITIVITY)    EKG EKG Interpretation Date/Time:  Friday April 14 2023 11:52:54 EST Ventricular Rate:  97 PR Interval:  144 QRS Duration:  79 QT Interval:  372 QTC Calculation: 473 R Axis:   88  Text Interpretation: Sinus rhythm Borderline right axis deviation Abnrm T, consider ischemia, anterolateral lds Confirmed by Vonita Moss [phone]) on 04/14/2023 12:24:32 PM  Radiology DG Chest Port 1 View  Result Date: 04/14/2023 CLINICAL DATA:  Shortness of breath EXAM: PORTABLE CHEST 1 VIEW COMPARISON:  12/12/2022 FINDINGS: Mild cardiomegaly. Increased bibasilar interstitial markings. No lobar consolidation. No pleural effusion or pneumothorax. IMPRESSION: Increased bibasilar interstitial markings, which may reflect bronchitic lung changes or  atelectasis. Electronically Signed   By: Duanne Guess D.O.   On: 04/14/2023 12:04    Procedures Procedures    Medications Ordered in ED Medications  pantoprazole (PROTONIX) EC tablet 40 mg (has no administration in time range)  ipratropium-albuterol (DUONEB) 0.5-2.5 (3) MG/3ML nebulizer solution 3 mL (has no administration in time range)  docusate sodium (COLACE) capsule 100 mg (has no administration in time range)  polyethylene glycol (MIRALAX / GLYCOLAX) packet 17 g (has no administration in time range)  enoxaparin (LOVENOX) injection 40 mg (has no administration in time range)  ondansetron (ZOFRAN) injection 4 mg (has no administration in time range)  insulin aspart (novoLOG) injection 0-9 Units (has no administration in time range)  insulin aspart (novoLOG) injection 0-5 Units (has no administration in time range)  cefTRIAXone (ROCEPHIN) 2 g in sodium chloride 0.9 % 100 mL IVPB (has no administration in time range)  azithromycin (ZITHROMAX) 500 mg in sodium chloride 0.9 % 250 mL IVPB (has no administration in time range)  methylPREDNISolone sodium succinate (SOLU-MEDROL) 40 mg/mL injection 40 mg (has no administration in time range)  arformoterol (BROVANA) nebulizer solution 15 mcg (has no administration in time range)  revefenacin (YUPELRI) nebulizer solution 175 mcg (has no administration in time range)  budesonide (PULMICORT) nebulizer solution 0.25 mg (has no administration in time range)  magnesium sulfate IVPB 2 g 50 mL (0 g Intravenous Stopped 04/14/23 1311)  albuterol (PROVENTIL) (2.5 MG/3ML) 0.083% nebulizer solution (15 mg/hr Nebulization Given 04/14/23 1316)  methylPREDNISolone sodium succinate (SOLU-MEDROL) 125 mg/2 mL injection 125 mg (125 mg Intravenous Given 04/14/23 1207)  cefTRIAXone (ROCEPHIN) 2 g in sodium chloride 0.9 % 100 mL IVPB (0 g Intravenous Stopped 04/14/23 1239)  azithromycin (ZITHROMAX) 500 mg in sodium chloride 0.9 % 250 mL IVPB (0 mg Intravenous  Stopped 04/14/23 1313)  midazolam (VERSED) injection 2 mg (2 mg Intravenous Given 04/14/23 1206)  lactated ringers bolus 500 mL (500 mLs Intravenous New Bag/Given 04/14/23 1327)    ED Course/ Medical Decision Making/ A&P Clinical Course as of 04/14/23 1403  Fri Apr 14, 2023  1214 Full code verified with patient [RP]  1307 Tessie Fass from ICU consulted.  [RP]  1319 Dr Carmelia Roller from hospitalist consulted and will evaluate the patient [RP]  1346 Nelda Marseille from ICU has admitted the patient [RP]    Clinical Course User Index [RP] Rondel Baton, MD                                 Medical Decision Making Amount and/or Complexity of Data Reviewed Labs: ordered. Radiology: ordered.  Risk Prescription drug management. Decision regarding hospitalization.   [name] is a 62 y.o. female with comorbidities that complicate the patient evaluation including COPD on 5 L home oxygen, hypertension, CAD, and cocaine abuse who presents to the emergency department in respiratory distress.   Initial Ddx:  COPD exacerbation, respiratory distress, pneumonia, URI, CHF, PE  MDM/Course:  Patient presents to the emergency department with respiratory distress.  Appears to have been preceded by several days of cough.  Was febrile and tachycardic on arrival.  Sepsis protocol initiated and she was started on ceftriaxone and azithromycin for suspected pneumonia.  She was also in respiratory distress so was started on BiPAP.  Had continuous albuterol and Solu-Medrol given to her.  COVID and flu are negative.  Chest x-ray without apparent pneumonia but suspect that it may be too early for her to show up on imaging.  She was monitored in the emergency department and remained stable.  However, due to her probable pneumonia and COPD and expected clinical course was discussed with ICU who is agreed to admit the patient.  Did consider pulmonary embolism but feel this is less likely given her history of COPD  and have restricted her air movement is at this time.    This patient presents to the ED for concern of complaints listed in HPI, this involves an extensive number of treatment options, and is a complaint that carries with it a high risk of complications and morbidity. Disposition including potential need for admission considered.   Dispo: ICU  Records reviewed Outpatient Clinic  Notes The following labs were independently interpreted: VBG and show  hypercapnic respiratory failure I independently reviewed the following imaging with scope of interpretation limited to determining acute life threatening conditions related to emergency care: Chest x-ray and agree with the radiologist interpretation with the following exceptions: none I personally reviewed and interpreted cardiac monitoring: sinus tachycardia I personally reviewed and interpreted the pt's EKG: see above for interpretation  I have reviewed the patients home medications and made adjustments as needed Consults: Critical care  Portions of this note were generated with Dragon dictation software. Dictation errors may occur despite best attempts at proofreading.    CRITICAL CARE Performed by: Rondel Baton   Total critical care time: 60 minutes  Critical care time was exclusive of separately billable procedures and treating other patients.  Critical care was necessary to treat or prevent imminent or life-threatening deterioration.  Critical care was time spent personally by me on the following activities: development of treatment plan with patient and/or surrogate as well as nursing, discussions with consultants, evaluation of patient's response to treatment, examination of patient, obtaining history from patient or surrogate, ordering and performing treatments and interventions, ordering and review of laboratory studies, ordering and review of radiographic studies, pulse oximetry and re-evaluation of patient's  condition.   Final Clinical Impression(s) / ED Diagnoses Final diagnoses:  Sepsis, due to unspecified organism, unspecified whether acute organ dysfunction present (HCC)  COPD exacerbation (HCC)  Respiratory distress  Hypoxia  Hypercapnia    Rx / DC Orders ED Discharge Orders     None         Rondel Baton, MD 04/14/23 [phone]

## 2023-04-15 ENCOUNTER — Other Ambulatory Visit: Payer: Self-pay

## 2023-04-15 DIAGNOSIS — J9621 Acute and chronic respiratory failure with hypoxia: Secondary | ICD-10-CM | POA: Diagnosis not present

## 2023-04-15 DIAGNOSIS — J441 Chronic obstructive pulmonary disease with (acute) exacerbation: Secondary | ICD-10-CM | POA: Diagnosis not present

## 2023-04-15 DIAGNOSIS — J9622 Acute and chronic respiratory failure with hypercapnia: Secondary | ICD-10-CM | POA: Diagnosis not present

## 2023-04-15 LAB — CBC WITH DIFFERENTIAL/PLATELET
Abs Immature Granulocytes: 0.05 10*3/uL (ref 0.00–0.07)
Basophils Absolute: 0 10*3/uL (ref 0.0–0.1)
Basophils Relative: 0 %
Eosinophils Absolute: 0 10*3/uL (ref 0.0–0.5)
Eosinophils Relative: 0 %
HCT: 36.2 % (ref 36.0–46.0)
Hemoglobin: 11.6 g/dL — ABNORMAL LOW (ref 12.0–15.0)
Immature Granulocytes: 1 %
Lymphocytes Relative: 7 %
Lymphs Abs: 0.7 10*3/uL (ref 0.7–4.0)
MCH: 29.9 pg (ref 26.0–34.0)
MCHC: 32 g/dL (ref 30.0–36.0)
MCV: 93.3 fL (ref 80.0–100.0)
Monocytes Absolute: 0.5 10*3/uL (ref 0.1–1.0)
Monocytes Relative: 5 %
Neutro Abs: 9 10*3/uL — ABNORMAL HIGH (ref 1.7–7.7)
Neutrophils Relative %: 87 %
Platelets: 238 10*3/uL (ref 150–400)
RBC: 3.88 MIL/uL (ref 3.87–5.11)
RDW: 13.7 % (ref 11.5–15.5)
WBC: 10.2 10*3/uL (ref 4.0–10.5)
nRBC: 0 % (ref 0.0–0.2)

## 2023-04-15 LAB — GLUCOSE, CAPILLARY
Glucose-Capillary: 159 mg/dL — ABNORMAL HIGH (ref 70–99)
Glucose-Capillary: 103 mg/dL — ABNORMAL HIGH (ref 70–99)
Glucose-Capillary: 178 mg/dL — ABNORMAL HIGH (ref 70–99)
Glucose-Capillary: 95 mg/dL (ref 70–99)

## 2023-04-15 LAB — COMPREHENSIVE METABOLIC PANEL
ALT: 16 U/L (ref 0–44)
AST: 15 U/L (ref 15–41)
Albumin: 3.1 g/dL — ABNORMAL LOW (ref 3.5–5.0)
Alkaline Phosphatase: 37 U/L — ABNORMAL LOW (ref 38–126)
Anion gap: 6 (ref 5–15)
BUN: 9 mg/dL (ref 8–23)
CO2: 29 mmol/L (ref 22–32)
Calcium: 8.5 mg/dL — ABNORMAL LOW (ref 8.9–10.3)
Chloride: 104 mmol/L (ref 98–111)
Creatinine, Ser: 0.59 mg/dL (ref 0.44–1.00)
GFR, Estimated: >60 mL/min (ref >60)
Glucose, Bld: 101 mg/dL — ABNORMAL HIGH (ref 70–99)
Potassium: 4.2 mmol/L (ref 3.5–5.1)
Sodium: 139 mmol/L (ref 135–145)
Total Bilirubin: 0.4 mg/dL (ref <1.2)
Total Protein: 5.7 g/dL — ABNORMAL LOW (ref 6.5–8.1)

## 2023-04-15 LAB — MAGNESIUM: Magnesium: 2.5 mg/dL — ABNORMAL HIGH (ref 1.7–2.4)

## 2023-04-15 MED ORDER — CHLORHEXIDINE GLUCONATE CLOTH 2 % EX PADS
6.0000 | MEDICATED_PAD | Freq: Every day | CUTANEOUS | Status: DC
  Administered 2023-04-15 – 2023-04-16 (×2): 6 via TOPICAL

## 2023-04-15 MED ORDER — MELATONIN 3 MG PO TABS
3.0000 mg | ORAL_TABLET | Freq: Every day | ORAL | Status: DC
  Administered 2023-04-15 – 2023-04-16 (×2): 3 mg via ORAL
  Filled 2023-04-15 (×2): qty 1

## 2023-04-15 NOTE — Progress Notes (Signed)
Pt. States she does not want to wear bipap. Pt. States she will remain on her cannula.
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
## 2023-04-15 NOTE — Progress Notes (Signed)
eLink Physician-Brief Progress Note Patient Name: [name] DOB: 1960/06/15 MRN: [id number]   Date of Service  04/15/2023  HPI/Events of Note    eICU Interventions     Insomnia Melatonin order placed     [facility] 04/15/2023, 8:29 PM

## 2023-04-15 NOTE — Progress Notes (Signed)
Pt stable for transport and vitals w/in order set

## 2023-04-16 ENCOUNTER — Telehealth: Payer: Self-pay | Admitting: Pulmonary Disease

## 2023-04-16 DIAGNOSIS — J441 Chronic obstructive pulmonary disease with (acute) exacerbation: Secondary | ICD-10-CM | POA: Diagnosis not present

## 2023-04-16 DIAGNOSIS — J9621 Acute and chronic respiratory failure with hypoxia: Secondary | ICD-10-CM | POA: Diagnosis not present

## 2023-04-16 DIAGNOSIS — J9622 Acute and chronic respiratory failure with hypercapnia: Secondary | ICD-10-CM | POA: Diagnosis not present

## 2023-04-16 LAB — GLUCOSE, CAPILLARY
Glucose-Capillary: 140 mg/dL — ABNORMAL HIGH (ref 70–99)
Glucose-Capillary: 107 mg/dL — ABNORMAL HIGH (ref 70–99)
Glucose-Capillary: 114 mg/dL — ABNORMAL HIGH (ref 70–99)
Glucose-Capillary: 91 mg/dL (ref 70–99)

## 2023-04-16 LAB — BRAIN NATRIURETIC PEPTIDE: B Natriuretic Peptide: 69.6 pg/mL (ref 0.0–100.0)

## 2023-04-16 MED ORDER — PREDNISONE 20 MG PO TABS
20.0000 mg | ORAL_TABLET | Freq: Every day | ORAL | Status: DC
Start: 2023-04-22 — End: 2023-04-27

## 2023-04-16 MED ORDER — PREDNISONE 20 MG PO TABS
40.0000 mg | ORAL_TABLET | Freq: Every day | ORAL | Status: DC
  Administered 2023-04-17: 40 mg via ORAL
  Filled 2023-04-16: qty 2

## 2023-04-16 MED ORDER — FUROSEMIDE 10 MG/ML IJ SOLN
40.0000 mg | Freq: Once | INTRAMUSCULAR | Status: AC
  Administered 2023-04-16: 40 mg via INTRAVENOUS
  Filled 2023-04-16: qty 4

## 2023-04-16 NOTE — Plan of Care (Signed)
  Problem: Education: Goal: Ability to describe self-care measures that may prevent or decrease complications (Diabetes Survival Skills Education) will improve Outcome: Progressing Goal: Individualized Educational Video(s) Outcome: Progressing   Problem: Coping: Goal: Ability to adjust to condition or change in health will improve Outcome: Progressing   Problem: Fluid Volume: Goal: Ability to maintain a balanced intake and output will improve Outcome: Progressing   Problem: Nutritional: Goal: Maintenance of adequate nutrition will improve Outcome: Progressing Goal: Progress toward achieving an optimal weight will improve Outcome: Progressing   Problem: Skin Integrity: Goal: Risk for impaired skin integrity will decrease Outcome: Progressing   Problem: Education: Goal: Knowledge of General Education information will improve Description: Including pain rating scale, medication(s)/side effects and non-pharmacologic comfort measures Outcome: Progressing   Problem: Clinical Measurements: Goal: Ability to maintain clinical measurements within normal limits will improve Outcome: Progressing Goal: Will remain free from infection Outcome: Progressing Goal: Diagnostic test results will improve Outcome: Progressing Goal: Respiratory complications will improve Outcome: Progressing Goal: Cardiovascular complication will be avoided Outcome: Progressing   Problem: Activity: Goal: Risk for activity intolerance will decrease Outcome: Progressing   Problem: Coping: Goal: Level of anxiety will decrease Outcome: Progressing

## 2023-04-16 NOTE — Plan of Care (Signed)

## 2023-04-16 NOTE — Progress Notes (Signed)
PROGRESS NOTE                                                                                                                                                                                                             Patient Demographics:    [name], is a 62 y.o. female, DOB - [birth year], QMV:[id number]  Outpatient Primary MD for the patient is Osei-Bonsu, Greggory Stallion, MD    LOS - 2  Admit date - 04/14/2023    Chief Complaint  Patient presents with   Respiratory Distress       Brief Narrative (HPI from H&P)   62 y.o. woman with history of COPD and asthma overlap followed in pulmonary clinic on chronic 5 L nasal cannula and chronic prednisone 10 mg daily presents with febrile illness and respiratory failure consistent with COPD exacerbation.  He was admitted with acute on chronic hypoxic respiratory failure to ICU initially placed on BiPAP stabilized and then transferred to my service on 04/16/2023.   Subjective:    [facility] today has, No headache, No chest pain, No abdominal pain - No Nausea, No new weakness tingling or numbness, no SOB   Assessment  & Plan :    Acute on chronic hypoxemic and hypercarbic respiratory failure due to COPD exacerbation: Febrile illness.  Chest x-ray looks more bronchitic in nature.  Pneumonia possible.  Likely respiratory illness, infection, etiology of decompensation.    COVID and flu negative. Continue solumedrol 40 mg daily, will taper over the next 2 to 3 weeks, of note she is on 10 mg of prednisone daily on a chronic basis, for now continue empiric antibiotics which are Rocephin and azithromycin, continue Yupelri, arformoterol, budesonide nebs scheduled and DuoNebs as needed. Baseline oxygen requirement 5 L, goal O2 sat 88%, decrease FiO2 as able, cynically improving advance activity titrate down oxygen.  Gentle Lasix on 04/16/2023 for Some crackles.   GERD:  continue PPI    Hyperlipidemia:  Received monoclonal antibody injection 11/22, to be dosed every 14 days         Condition - Extremely Guarded  Family Communication  :  None  Code Status :  Full  Consults  :  PCCM  PUD Prophylaxis :  PPI   Procedures  :            Disposition Plan  :  Status is: Inpatient  DVT Prophylaxis  :    enoxaparin (LOVENOX) injection 40 mg Start: 04/14/23 1400    Lab Results  Component Value Date   PLT 238 04/15/2023    Diet :  Diet Order             Diet regular Room service appropriate? Yes; Fluid consistency: Thin  Diet effective now                    Inpatient Medications  Scheduled Meds:  arformoterol  15 mcg Nebulization BID   budesonide (PULMICORT) nebulizer solution  0.25 mg Nebulization BID   Chlorhexidine Gluconate Cloth  6 each Topical Daily   enoxaparin (LOVENOX) injection  40 mg Subcutaneous Q24H   furosemide  40 mg Intravenous Once   insulin aspart  0-5 Units Subcutaneous QHS   insulin aspart  0-9 Units Subcutaneous TID WC   melatonin  3 mg Oral QHS   methylPREDNISolone (SOLU-MEDROL) injection  40 mg Intravenous Daily   mouth rinse  15 mL Mouth Rinse 4 times per day   pantoprazole  40 mg Oral Daily   revefenacin  175 mcg Nebulization Daily   Continuous Infusions:  azithromycin Stopped (04/15/23 1141)   cefTRIAXone (ROCEPHIN)  IV 2 g (04/16/23 0816)   PRN Meds:.docusate sodium, ipratropium-albuterol, ondansetron (ZOFRAN) IV, mouth rinse, polyethylene glycol    Objective:   Vitals:   04/16/23 0327 04/16/23 0400 04/16/23 0616 04/16/23 0750  BP: 125/88 122/70  104/65  Pulse: 70 60  81  Resp: (!) 27 16  20   Temp: (!) 97.3 F (36.3 C)   97.8 F (36.6 C)  TempSrc: Oral   Oral  SpO2: 100% 100% 98% 98%    Wt Readings from Last 3 Encounters:  03/07/23 73.2 kg  01/20/23 67.8 kg  01/18/23 67.8 kg     Intake/Output Summary (Last 24 hours) at 04/16/2023 0830 Last data filed at 04/15/2023 1201 Gross per 24 hour   Intake 250.1 ml  Output 450 ml  Net -199.9 ml     Physical Exam  Awake Alert, No new F.N deficits, Normal affect [address].AT,PERRAL Supple Neck, No JVD,   Symmetrical Chest wall movement, Mod air movement bilaterally, few rales no wheezes RRR,No Gallops,Rubs or new Murmurs,  +ve B.Sounds, Abd Soft, No tenderness,   No Cyanosis, Clubbing or edema        Data Review:    Recent Labs  Lab 04/14/23 1150 04/14/23 1213 04/15/23 0152  WBC 10.6*  --  10.2  HGB 12.6 13.6 11.6*  HCT 41.1 40.0 36.2  PLT 243  --  238  MCV 94.1  --  93.3  MCH 28.8  --  29.9  MCHC 30.7  --  32.0  RDW 13.6  --  13.7  LYMPHSABS  --   --  0.7  MONOABS  --   --  0.5  EOSABS  --   --  0.0  BASOSABS  --   --  0.0    Recent Labs  Lab 04/14/23 1150 04/14/23 1213 04/14/23 1346 04/14/23 1541 04/15/23 0152  NA 142 142  --   --  139  K 3.6 3.5  --   --  4.2  CL 104  --   --   --  104  CO2 29  --   --   --  29  ANIONGAP 9  --   --   --  6  GLUCOSE 80  --   --   --  101*  BUN 12  --   --   --  9  CREATININE 0.83  --   --   --  0.59  AST 19  --   --   --  15  ALT 18  --   --   --  16  ALKPHOS 54  --   --   --  37*  BILITOT 0.7  --   --   --  0.4  ALBUMIN 3.9  --   --   --  3.1*  LATICACIDVEN  --  1.0 1.6  --   --   HGBA1C  --   --   --  6.3*  --   BNP 62.6  --   --   --   --   MG  --   --   --   --  2.5*  CALCIUM 9.2  --   --   --  8.5*      Recent Labs  Lab 04/14/23 1150 04/14/23 1213 04/14/23 1346 04/14/23 1541 04/15/23 0152  LATICACIDVEN  --  1.0 1.6  --   --   HGBA1C  --   --   --  6.3*  --   BNP 62.6  --   --   --   --   MG  --   --   --   --  2.5*  CALCIUM 9.2  --   --   --  8.5*    Lab Results  Component Value Date   HGBA1C 6.3 (H) 04/14/2023    Radiology Reports DG Chest Port 1 View  Result Date: 04/14/2023 CLINICAL DATA:  Shortness of breath EXAM: PORTABLE CHEST 1 VIEW COMPARISON:  12/12/2022 FINDINGS: Mild cardiomegaly. Increased bibasilar interstitial markings. No  lobar consolidation. No pleural effusion or pneumothorax. IMPRESSION: Increased bibasilar interstitial markings, which may reflect bronchitic lung changes or atelectasis. Electronically Signed   By: Duanne Guess D.O.   On: 04/14/2023 12:04      Signature  -   Susa Raring M.D on 04/16/2023 at 8:30 AM   -  To page go to [email or web address]
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
## 2023-04-16 NOTE — Evaluation (Signed)
Physical Therapy Brief Evaluation and Discharge Note Patient Details Name: [name] MRN: [id number] DOB: 1960/09/06 Today's Date: 04/16/2023   History of Present Illness  62 y.o. woman with history of COPD and asthma overlap followed in pulmonary clinic on chronic 5 L nasal cannula and chronic prednisone 10 mg daily presents with febrile illness and respiratory failure consistent with COPD exacerbation.  Clinical Impression  Pt presents with admitting diagnosis above. Pt today was able to ambulate independently in hallway with no AD. PTA pt reports she was fully independent still working and driving. Pt presents at or near baseline mobility. Pt has no further acute PT needs and will be signing off. Re consult PT if mobility status changes. Pt would benefit from continued mobility with mobility specialist during acute stay.      PT Assessment Patient does not need any further PT services  Assistance Needed at Discharge  PRN    Equipment Recommendations None recommended by PT  Recommendations for Other Services       Precautions/Restrictions Precautions Precautions: Fall Restrictions Weight Bearing Restrictions: No        Mobility  Bed Mobility   Supine/Sidelying to sit: Modified independent (Device/Increased time) Sit to supine/sidelying: Modified independent (Device/Increased time)    Transfers Overall transfer level: Independent Equipment used: None                    Ambulation/Gait Ambulation/Gait assistance: Independent Gait Distance (Feet): 200 Feet Assistive device: None Gait Pattern/deviations: WFL(Within Functional Limits) Gait Speed: Pace WFL General Gait Details: no LOB noted. HR up to 142 during ambulation. 1 standing rest break to check O2 sats. Pt briefly dropped to 87% on 4L and cued for PLB.  Home Activity Instructions    Stairs            Modified Rankin (Stroke Patients Only)        Balance Overall balance assessment:  No apparent balance deficits (not formally assessed)                        Pertinent Vitals/Pain PT - Brief Vital Signs All Vital Signs Stable: Yes Pain Assessment Pain Assessment: No/denies pain     Home Living Family/patient expects to be discharged to:: Private residence Living Arrangements: Children Available Help at Discharge: Family;Available PRN/intermittently Home Environment: Level entry   Home Equipment: Rollator (4 wheels);Other (comment) (O2 tank and concentrator)   Additional Comments: uses 5L O2 around the clock    Prior Function Level of Independence: Independent Comments: Still working and driving    UE/LE Assessment   UE ROM/Strength/Tone/Coordination: WFL    LE ROM/Strength/Tone/Coordination: [facility]      Communication   Communication Communication: No apparent difficulties     Cognition Overall Cognitive Status: Appears within functional limits for tasks assessed/performed       General Comments General comments (skin integrity, edema, etc.): HR up to 142 during ambulation. Briefly dropped to 87% on 4L. Titrated to 6L and VSS. Pt on 5L at baseline and no 5L setting on tank.    Exercises     Assessment/Plan    PT Problem List         PT Visit Diagnosis Other abnormalities of gait and mobility (R26.89)    No Skilled PT Patient at baseline level of functioning;Patient is independent with all acitivity/mobility   Co-evaluation                AMPAC 6 Clicks Help needed  turning from your back to your side while in a flat bed without using bedrails?: None Help needed moving from lying on your back to sitting on the side of a flat bed without using bedrails?: None Help needed moving to and from a bed to a chair (including a wheelchair)?: None Help needed standing up from a chair using your arms (e.g., wheelchair or bedside chair)?: None Help needed to walk in hospital room?: None Help needed climbing 3-5 steps with a railing? :  A Little 6 Click Score: 23      End of Session Equipment Utilized During Treatment: Gait belt;Oxygen Activity Tolerance: Patient tolerated treatment well Patient left: in bed;with call bell/phone within reach Nurse Communication: Mobility status PT Visit Diagnosis: Other abnormalities of gait and mobility (R26.89)     Time: 1030-1046 PT Time Calculation (min) (ACUTE ONLY): 16 min  Charges:   PT Evaluation $PT Eval Low Complexity: 1 Low      Shela Nevin, PT, DPT Acute Rehab Services [phone]   Gladys Damme  04/16/2023, 2:51 PM

## 2023-04-16 NOTE — Telephone Encounter (Signed)
Please arrange hospital follow-up with Dr. Isaiah Serge or Rubye Oaks, NP in the next 4-6 weeks. Thanks!

## 2023-04-17 ENCOUNTER — Other Ambulatory Visit (HOSPITAL_COMMUNITY): Payer: Self-pay

## 2023-04-17 DIAGNOSIS — J441 Chronic obstructive pulmonary disease with (acute) exacerbation: Secondary | ICD-10-CM | POA: Diagnosis not present

## 2023-04-17 LAB — BASIC METABOLIC PANEL
Anion gap: 8 (ref 5–15)
BUN: 13 mg/dL (ref 8–23)
CO2: 35 mmol/L — ABNORMAL HIGH (ref 22–32)
Calcium: 8.7 mg/dL — ABNORMAL LOW (ref 8.9–10.3)
Chloride: 99 mmol/L (ref 98–111)
Creatinine, Ser: 0.58 mg/dL (ref 0.44–1.00)
GFR, Estimated: >60 mL/min (ref >60)
Glucose, Bld: 76 mg/dL (ref 70–99)
Potassium: 3.9 mmol/L (ref 3.5–5.1)
Sodium: 142 mmol/L (ref 135–145)

## 2023-04-17 LAB — CBC WITH DIFFERENTIAL/PLATELET
Abs Immature Granulocytes: 0.04 10*3/uL (ref 0.00–0.07)
Basophils Absolute: 0 10*3/uL (ref 0.0–0.1)
Basophils Relative: 0 %
Eosinophils Absolute: 0.1 10*3/uL (ref 0.0–0.5)
Eosinophils Relative: 1 %
HCT: 41.2 % (ref 36.0–46.0)
Hemoglobin: 12.5 g/dL (ref 12.0–15.0)
Immature Granulocytes: 0 %
Lymphocytes Relative: 19 %
Lymphs Abs: 1.7 10*3/uL (ref 0.7–4.0)
MCH: 28.1 pg (ref 26.0–34.0)
MCHC: 30.3 g/dL (ref 30.0–36.0)
MCV: 92.6 fL (ref 80.0–100.0)
Monocytes Absolute: 0.8 10*3/uL (ref 0.1–1.0)
Monocytes Relative: 9 %
Neutro Abs: 6.4 10*3/uL (ref 1.7–7.7)
Neutrophils Relative %: 71 %
Platelets: 261 10*3/uL (ref 150–400)
RBC: 4.45 MIL/uL (ref 3.87–5.11)
RDW: 13.4 % (ref 11.5–15.5)
WBC: 9.1 10*3/uL (ref 4.0–10.5)
nRBC: 0 % (ref 0.0–0.2)

## 2023-04-17 LAB — MAGNESIUM: Magnesium: 2.3 mg/dL (ref 1.7–2.4)

## 2023-04-17 LAB — BRAIN NATRIURETIC PEPTIDE: B Natriuretic Peptide: 40.7 pg/mL (ref 0.0–100.0)

## 2023-04-17 LAB — GLUCOSE, CAPILLARY: Glucose-Capillary: 170 mg/dL — ABNORMAL HIGH (ref 70–99)

## 2023-04-17 MED ORDER — AZITHROMYCIN 500 MG PO TABS
500.0000 mg | ORAL_TABLET | Freq: Every day | ORAL | Status: DC
  Administered 2023-04-17: 500 mg via ORAL
  Filled 2023-04-17: qty 1

## 2023-04-17 MED ORDER — POTASSIUM CHLORIDE CRYS ER 20 MEQ PO TBCR
40.0000 meq | EXTENDED_RELEASE_TABLET | Freq: Once | ORAL | Status: AC
  Administered 2023-04-17: 40 meq via ORAL
  Filled 2023-04-17: qty 2

## 2023-04-17 MED ORDER — AZITHROMYCIN 500 MG PO TABS
500.0000 mg | ORAL_TABLET | Freq: Every day | ORAL | 0 refills | Status: DC
  Filled 2023-04-17: qty 2, 2d supply, fill #0

## 2023-04-17 MED ORDER — PREDNISONE 10 MG PO TABS
ORAL_TABLET | ORAL | 0 refills | Status: DC
  Filled 2023-04-17: qty 30, 15d supply, fill #0
  Filled 2023-04-17: qty 30, 21d supply, fill #0

## 2023-04-17 MED ORDER — FUROSEMIDE 10 MG/ML IJ SOLN
20.0000 mg | Freq: Once | INTRAMUSCULAR | Status: AC
  Administered 2023-04-17: 20 mg via INTRAVENOUS
  Filled 2023-04-17: qty 2

## 2023-04-17 MED ORDER — CEPHALEXIN 500 MG PO CAPS
500.0000 mg | ORAL_CAPSULE | Freq: Three times a day (TID) | ORAL | 0 refills | Status: AC
Start: 2023-04-17 — End: 2023-04-20
  Filled 2023-04-17: qty 9, 3d supply, fill #0

## 2023-04-17 NOTE — Plan of Care (Signed)

## 2023-04-17 NOTE — Plan of Care (Signed)
 Patient discharging home today with family.

## 2023-04-17 NOTE — TOC Transition Note (Signed)
Transition of Care [facility]) - CM/SW Discharge Note   Patient Details  Name: [name] MRN: [id number] Date of Birth: 1960/05/24  Transition of Care [facility]) CM/SW Contact:  Gordy Clement, RN Phone Number: 04/17/2023, 10:12 AM   Clinical Narrative:   Patient will DC to home today  No TOC needs identified  Family to transport            Patient Goals and CMS Choice      Discharge Placement                         Discharge Plan and Services Additional resources added to the After Visit Summary for                                       Social Determinants of Health (SDOH) Interventions SDOH Screenings   Food Insecurity: No Food Insecurity (04/15/2023)  Housing: Low Risk  (04/15/2023)  Transportation Needs: No Transportation Needs (04/15/2023)  Utilities: Not At Risk (04/15/2023)  Financial Resource Strain: Low Risk  (04/10/2019)  Physical Activity: Inactive (04/10/2019)  Social Connections: Unknown (04/10/2019)  Stress: No Stress Concern Present (04/10/2019)  Tobacco Use: Medium Risk (04/14/2023)     Readmission Risk Interventions    12/13/2022   11:05 AM 05/23/2022   10:12 AM  Readmission Risk Prevention Plan  Transportation Screening Complete Complete  PCP or Specialist Appt within 5-7 Days Complete   PCP or Specialist Appt within 3-5 Days  Complete  Home Care Screening Complete   Medication Review (RN CM) Complete   HRI or Home Care Consult  Complete  Social Work Consult for Recovery Care Planning/Counseling  Complete  Palliative Care Screening  Not Applicable  Medication Review [employer])  Complete

## 2023-04-17 NOTE — Progress Notes (Signed)
   04/17/23 1100  Spiritual Encounters  Type of Visit Initial  Care provided to: Patient  Referral source Patient request  Reason for visit Advance directives  OnCall Visit No   Chaplain responded to request for AD. There was no family present at bedside. Chaplain assisted patient with AD education. Patient said she will take the form home and she will mail it back once she has completed the form. No follow-up needed at this time.

## 2023-04-17 NOTE — Progress Notes (Signed)
Mobility Specialist Progress Note;   04/17/23 0900  Mobility  Activity Ambulated with assistance in hallway  Level of Assistance Standby assist, set-up cues, supervision of patient - no hands on  Assistive Device None  Distance Ambulated (ft) 200 ft  Activity Response Tolerated well  Mobility Referral Yes  $Mobility charge 1 Mobility  Mobility Specialist Start Time (ACUTE ONLY) 0900  Mobility Specialist Stop Time (ACUTE ONLY) 0915  Mobility Specialist Time Calculation (min) (ACUTE ONLY) 15 min    During-mobility: HR 111; SPO2 92-95% 4LO2 Post-mobility: HR 93; SPO2 97% 5LO2  Pt agreeable to mobility. Required no physical assistance during ambulation, SV. Ambulated on 4LO2, VSS throughout. C/o some fatigue at EOS, otherwise asx. Pt returned back to EOB with all needs met.   Caesar Bookman Mobility Specialist Please contact via SecureChat or Rehab Office [phone]

## 2023-04-17 NOTE — Discharge Summary (Signed)
[name] WUJ:[id number] DOB: 08/21/1960 DOA: 04/14/2023  PCP: Jackie Plum, MD  Admit date: 04/14/2023  Discharge date: 04/17/2023  Admitted From: Home   Disposition:  Home   Recommendations for Outpatient Follow-up:   Follow up with PCP in 1-2 weeks  PCP Please obtain BMP/CBC, 2 view CXR in 1week,  (see Discharge instructions)   PCP Please follow up on the following pending results:    Home Health: None   Equipment/Devices: None  Consultations: None  Discharge Condition: Stable    CODE STATUS: Full    Diet Recommendation: Heart Healthy     Chief Complaint  Patient presents with   Respiratory Distress     Brief history of present illness from the day of admission and additional interim summary    62 y.o. woman with history of COPD and asthma overlap followed in pulmonary clinic on chronic 5 L nasal cannula and chronic prednisone 10 mg daily presents with febrile illness and respiratory failure consistent with COPD exacerbation.  He was admitted with acute on chronic hypoxic respiratory failure to ICU initially placed on BiPAP stabilized and then transferred to my service on 04/16/2023.                                                                  Hospital Course   Acute on chronic hypoxemic and hypercarbic respiratory failure due to COPD exacerbation: Febrile illness.  Chest x-ray looks more bronchitic in nature.  Pneumonia possible.  Likely respiratory illness, infection, etiology of decompensation.     COVID and flu negative.  Responded well to steroids and empiric antibiotics, much improved and better than baseline, using 4 L nasal cannula oxygen now without any wheezing at baseline she uses 5 L, will place her on prednisone taper over the next week of note she is on 10 mg of prednisone  daily on a chronic basis, she is eager to go home will be discharged home today on 3 more days of oral antibiotics, 1 week of steroid taper thereafter 10 mg of oral prednisone and her home nebulizer treatments and oxygen.  Follow-up with PCP and primary pulmonologist within a week of discharge.   GERD:  continue PPI   Hyperlipidemia:  Received monoclonal antibody injection 11/22, to be dosed every 14 days    Discharge diagnosis     Principal Problem:   COPD with acute exacerbation [facility])    Discharge instructions    Discharge Instructions     Diet - low sodium heart healthy   Complete by: As directed    Discharge instructions   Complete by: As directed    Follow with Primary MD Jackie Plum, MD in 7 days   Get CBC, CMP, 2 view Chest X ray -  checked next visit with your primary MD  Activity: As tolerated with Full fall precautions use walker/cane & assistance as needed  Disposition Home    Diet: Heart Healthy    Special Instructions: If you have smoked or chewed Tobacco  in the last 2 yrs please stop smoking, stop any regular Alcohol  and or any Recreational drug use.  On your next visit with your primary care physician please Get Medicines reviewed and adjusted.  Please request your Prim.MD to go over all Hospital Tests and Procedure/Radiological results at the follow up, please get all Hospital records sent to your Prim MD by signing hospital release before you go home.  If you experience worsening of your admission symptoms, develop shortness of breath, life threatening emergency, suicidal or homicidal thoughts you must seek medical attention immediately by calling 911 or calling your MD immediately  if symptoms less severe.  You Must read complete instructions/literature along with all the possible adverse reactions/side effects for all the Medicines you take and that have been prescribed to you. Take any new Medicines after you have completely understood and accpet  all the possible adverse reactions/side effects.   Do not drive when taking Pain medications.  Do not take more than prescribed Pain, Sleep and Anxiety Medications   Increase activity slowly   Complete by: As directed        Discharge Medications   Allergies as of 04/17/2023       Reactions   Amoxicillin Shortness Of Breath   Statins Other (See Comments)   Worsened the patient's COPD   Tylenol [acetaminophen] Shortness Of Breath   Zetia [ezetimibe] Other (See Comments)   Worsened the patient's COPD        Medication List     TAKE these medications    albuterol 108 (90 Base) MCG/ACT inhaler Commonly known as: VENTOLIN HFA INHALE 2 PUFFS INTO THE LUNGS EVERY 4 HOURS AS NEEDED FOR WHEEZING OR SHORTNESS OF BREATH.   albuterol (2.5 MG/3ML) 0.083% nebulizer solution Commonly known as: PROVENTIL Take 3 mLs (2.5 mg total) by nebulization every 6 (six) hours as needed for wheezing or shortness of breath.   azithromycin 500 MG tablet Commonly known as: ZITHROMAX Take 1 tablet (500 mg total) by mouth daily.   cephALEXin 500 MG capsule Commonly known as: KEFLEX Take 1 capsule (500 mg total) by mouth 3 (three) times daily for 3 days.   IBU-200 PO Take 200-400 mg by mouth every 6 (six) hours as needed (for pain or headaches).   ipratropium-albuterol 0.5-2.5 (3) MG/3ML Soln Commonly known as: DUONEB Take 3 mLs by nebulization every 6 (six) hours as needed.   meclizine 12.5 MG tablet Commonly known as: ANTIVERT Take 12.5-25 mg by mouth 3 (three) times daily as needed.   omeprazole 20 MG capsule Commonly known as: PRILOSEC TAKE 1 CAPSULE BY MOUTH EVERY DAY   OXYGEN Inhale 5 L/min into the lungs See admin instructions. 5 L/min at bedtime and as needed for shortness of breath throughout the daytime   Praluent 150 MG/ML Soaj Generic drug: Alirocumab Inject 1 mL (150 mg total) into the skin every 14 (fourteen) days. What changed: additional instructions   predniSONE 10  MG tablet Commonly known as: DELTASONE Label  & dispense according to the schedule below. 3 Pills PO for 3 days then, 2 Pills PO for 3 days, then continue 1 pill 10 mg daily as before . What changed:  how much to take how to take this when to take this additional instructions   Trelegy Ellipta  200-62.5-25 MCG/ACT Aepb Generic drug: Fluticasone-Umeclidin-Vilant Inhale 1 puff into the lungs daily.         Follow-up Information     Osei-Bonsu, Greggory Stallion, MD. Schedule an appointment as soon as possible for a visit in 1 week(s).   Specialty: Internal Medicine Why: And your pulmonologist within a week of discharge Contact information: [address] [facility] [address] [phone]                 Major procedures and Radiology Reports - PLEASE review detailed and final reports thoroughly  -      DG Chest Port 1 View  Result Date: 04/14/2023 CLINICAL DATA:  Shortness of breath EXAM: PORTABLE CHEST 1 VIEW COMPARISON:  12/12/2022 FINDINGS: Mild cardiomegaly. Increased bibasilar interstitial markings. No lobar consolidation. No pleural effusion or pneumothorax. IMPRESSION: Increased bibasilar interstitial markings, which may reflect bronchitic lung changes or atelectasis. Electronically Signed   By: Duanne Guess D.O.   On: 04/14/2023 12:04   CT Chest Wo Contrast  Result Date: 04/14/2023 CLINICAL DATA:  Follow-up for lung nodules. EXAM: CT CHEST WITHOUT CONTRAST TECHNIQUE: Multidetector CT imaging of the chest was performed following the standard protocol without IV contrast. RADIATION DOSE REDUCTION: This exam was performed according to the departmental dose-optimization program which includes automated exposure control, adjustment of the mA and/or kV according to patient size and/or use of iterative reconstruction technique. COMPARISON:  Portable chest 12/12/2022, portable chest 09/29/2022, partial CTA chest for coronary angiography 09/28/2022, CTA chest 09/09/2022 and  older prior studies back to 2017. FINDINGS: Cardiovascular: There are mild scattered three-vessel coronary artery calcifications. Mild cardiomegaly. No pericardial effusion is seen. There is a prominent pulmonary trunk 3.2 cm indicating arterial hypertension, previously 2.9 cm. The pulmonary veins are normal caliber. There is mild atherosclerosis in the aorta and great vessels, mild aortic tortuosity without aneurysm. Mediastinum/Nodes: No enlarged mediastinal or axillary lymph nodes. Thyroid gland, trachea, and esophagus demonstrate no significant findings. Lungs/Pleura: There is mild-to-moderate emphysema with centrilobular changes predominating in mild paraseptal changes in the upper apices. Diffuse bronchial thickening is again noted and chronic. Medially in the right middle lobe base there is slight bronchiectasis, subsegmental bronchial plugging at least involving 1 subsegmental bronchus, and a small consolidation present is similar to 09/28/2022 and less nodular in appearance than on 09/09/2022. This could be a small fibrotic consolidation or could be an ongoing low-grade active infectious process, but it is unchanged in appearance since 09/28/2022. Elsewhere, there are scattered linear scar-like opacities in both lungs, with no other consolidation. There is a calcified granuloma the posterior left upper lobe. There is no pleural effusion. Upper Abdomen: No acute abnormality. Abdominal aortic atherosclerosis. Musculoskeletal: Degenerative change thoracic spine. No acute or significant osseous findings. The visualized chest wall is unremarkable. IMPRESSION: 1. Small consolidation in the medial right middle lobe base with slight bronchiectasis and subsegmental bronchial plugging. This could be a small fibrotic consolidation or could be an ongoing low-grade active infectious process, but it is unchanged in appearance since 09/28/2022. 2. Follow-up CT in 6 months recommended. For lung cancer screening, adhere to  Lung-RADS guidelines. 3. Emphysema and chronic bronchitis. 4. Aortic and coronary artery atherosclerosis. 5. Prominent pulmonary trunk 3.2 cm indicating arterial hypertension. Aortic Atherosclerosis (ICD10-I70.0) and Emphysema (ICD10-J43.9). Electronically Signed   By: Almira Bar M.D.   On: 04/14/2023 04:12    Micro Results    Recent Results (from the past 240 hour(s))  Blood culture (routine x 2)     Status: None (  Preliminary result)   Collection Time: 04/14/23 11:40 AM   Specimen: BLOOD  Result Value Ref Range Status   Specimen Description BLOOD RIGHT ANTECUBITAL  Final   Special Requests   Final    BOTTLES DRAWN AEROBIC AND ANAEROBIC Blood Culture results may not be optimal due to an inadequate volume of blood received in culture bottles   Culture   Final    NO GROWTH 3 DAYS Performed at [facility], [address], [place], [place] [address]    Report Status PENDING  Incomplete  Resp panel by RT-PCR (RSV, Flu A&B, Covid) Anterior Nasal Swab     Status: None   Collection Time: 04/14/23 11:43 AM   Specimen: Anterior Nasal Swab  Result Value Ref Range Status   SARS Coronavirus 2 by RT PCR NEGATIVE NEGATIVE Final   Influenza A by PCR NEGATIVE NEGATIVE Final   Influenza B by PCR NEGATIVE NEGATIVE Final    Comment: (NOTE) The Xpert Xpress SARS-CoV-2/FLU/RSV plus assay is intended as an aid in the diagnosis of influenza from Nasopharyngeal swab specimens and should not be used as a sole basis for treatment. Nasal washings and aspirates are unacceptable for Xpert Xpress SARS-CoV-2/FLU/RSV testing.  Fact Sheet for Patients: BloggerCourse.com  Fact Sheet for Healthcare Providers: SeriousBroker.it  This test is not yet approved or cleared by the [place] FDA and has been authorized for detection and/or diagnosis of SARS-CoV-2 by FDA under an Emergency Use Authorization (EUA). This EUA will remain in effect (meaning  this test can be used) for the duration of the COVID-19 declaration under Section 564(b)(1) of the Act, 21 U.S.C. section 360bbb-3(b)(1), unless the authorization is terminated or revoked.     Resp Syncytial Virus by PCR NEGATIVE NEGATIVE Final    Comment: (NOTE) Fact Sheet for Patients: BloggerCourse.com  Fact Sheet for Healthcare Providers: SeriousBroker.it  This test is not yet approved or cleared by the [place] FDA and has been authorized for detection and/or diagnosis of SARS-CoV-2 by FDA under an Emergency Use Authorization (EUA). This EUA will remain in effect (meaning this test can be used) for the duration of the COVID-19 declaration under Section 564(b)(1) of the Act, 21 U.S.C. section 360bbb-3(b)(1), unless the authorization is terminated or revoked.  Performed at [facility], [address], [place], [place] [address]   Blood culture (routine x 2)     Status: None (Preliminary result)   Collection Time: 04/14/23 11:50 AM   Specimen: BLOOD  Result Value Ref Range Status   Specimen Description BLOOD BLOOD LEFT FOREARM  Final   Special Requests   Final    AEROBIC BOTTLE ONLY Blood Culture results may not be optimal due to an inadequate volume of blood received in culture bottles   Culture   Final    NO GROWTH 3 DAYS Performed at [facility], [address], [place], [place] [address]    Report Status PENDING  Incomplete  MRSA Next Gen by PCR, Nasal     Status: None   Collection Time: 04/14/23  2:56 PM   Specimen: Nasal Mucosa; Nasal Swab  Result Value Ref Range Status   MRSA by PCR Next Gen NOT DETECTED NOT DETECTED Final    Comment: (NOTE) The GeneXpert MRSA Assay (FDA approved for NASAL specimens only), is one component of a comprehensive MRSA colonization surveillance program. It is not intended to diagnose MRSA infection nor to guide or monitor treatment for MRSA infections. Test  performance is  not FDA approved in patients less than 62 years old. Performed at [facility], [address], [place], [place] [address]     Today   Subjective    Sylvanna Mizerak today has no headache,no chest abdominal pain,no new weakness tingling or numbness, feels much better wants to go home today.    Objective   Blood pressure 129/83, pulse 67, temperature 97.7 F (36.5 C), temperature source Oral, resp. rate 19, weight 73.8 kg, SpO2 99%.  No intake or output data in the 24 hours ending 04/17/23 0829  Exam  Awake Alert, No new F.N deficits,    [place].AT,PERRAL Supple Neck,   Symmetrical Chest wall movement, Good air movement bilaterally, CTAB RRR,No Gallops,   +ve B.Sounds, Abd Soft, Non tender,  No Cyanosis, Clubbing or edema    Data Review   Recent Labs  Lab 04/14/23 1150 04/14/23 1213 04/15/23 0152 04/17/23 0503  WBC 10.6*  --  10.2 9.1  HGB 12.6 13.6 11.6* 12.5  HCT 41.1 40.0 36.2 41.2  PLT 243  --  238 261  MCV 94.1  --  93.3 92.6  MCH 28.8  --  29.9 28.1  MCHC 30.7  --  32.0 30.3  RDW 13.6  --  13.7 13.4  LYMPHSABS  --   --  0.7 1.7  MONOABS  --   --  0.5 0.8  EOSABS  --   --  0.0 0.1  BASOSABS  --   --  0.0 0.0    Recent Labs  Lab 04/14/23 1150 04/14/23 1213 04/14/23 1346 04/14/23 1541 04/15/23 0152 04/16/23 1011 04/17/23 0503  NA 142 142  --   --  139  --  142  K 3.6 3.5  --   --  4.2  --  3.9  CL 104  --   --   --  104  --  99  CO2 29  --   --   --  29  --  35*  ANIONGAP 9  --   --   --  6  --  8  GLUCOSE 80  --   --   --  101*  --  76  BUN 12  --   --   --  9  --  13  CREATININE 0.83  --   --   --  0.59  --  0.58  AST 19  --   --   --  15  --   --   ALT 18  --   --   --  16  --   --   ALKPHOS 54  --   --   --  37*  --   --   BILITOT 0.7  --   --   --  0.4  --   --   ALBUMIN 3.9  --   --   --  3.1*  --   --   LATICACIDVEN  --  1.0 1.6  --   --   --   --   HGBA1C  --   --   --  6.3*  --   --   --   BNP 62.6  --   --   --    --  69.6 40.7  MG  --   --   --   --  2.5*  --  2.3  CALCIUM 9.2  --   --   --  8.5*  --  8.7*  Total Time in preparing paper work, data evaluation and todays exam - 35 minutes  Signature  -    Susa Raring M.D on 04/17/2023 at 8:29 AM   -  To page go to [email or web address]

## 2023-04-17 NOTE — Discharge Instructions (Signed)
  Follow with Primary MD Jackie Plum, MD in 7 days   Get CBC, CMP, 2 view Chest X ray -  checked next visit with your primary MD   Activity: As tolerated with Full fall precautions use walker/cane & assistance as needed  Disposition Home    Diet: Heart Healthy    Special Instructions: If you have smoked or chewed Tobacco  in the last 2 yrs please stop smoking, stop any regular Alcohol  and or any Recreational drug use.  On your next visit with your primary care physician please Get Medicines reviewed and adjusted.  Please request your Prim.MD to go over all Hospital Tests and Procedure/Radiological results at the follow up, please get all Hospital records sent to your Prim MD by signing hospital release before you go home.  If you experience worsening of your admission symptoms, develop shortness of breath, life threatening emergency, suicidal or homicidal thoughts you must seek medical attention immediately by calling 911 or calling your MD immediately  if symptoms less severe.  You Must read complete instructions/literature along with all the possible adverse reactions/side effects for all the Medicines you take and that have been prescribed to you. Take any new Medicines after you have completely understood and accpet all the possible adverse reactions/side effects.   Do not drive when taking Pain medications.  Do not take more than prescribed Pain, Sleep and Anxiety Medications

## 2023-04-19 LAB — CULTURE, BLOOD (ROUTINE X 2)
Culture: NO GROWTH
Culture: NO GROWTH

## 2023-04-19 NOTE — Telephone Encounter (Signed)
Ideally seen sooner for hosp f/u

## 2023-04-19 NOTE — Progress Notes (Signed)
Called and spoke with patient, advised of results/recommendations per Rubye Oaks NP.  She has a f/u with Dr. Isaiah Serge on 06/12/23 and will have a f/u CT HR.  She verbalized understanding.  Nothing further needed.

## 2023-04-25 ENCOUNTER — Encounter: Payer: Self-pay | Admitting: Pulmonary Disease

## 2023-04-25 ENCOUNTER — Ambulatory Visit: Payer: BC Managed Care – PPO | Admitting: Pulmonary Disease

## 2023-04-25 VITALS — BP 118/74 | HR 86 | Temp 98.1°F | Ht 63.0 in | Wt 160.4 lb

## 2023-04-25 DIAGNOSIS — J441 Chronic obstructive pulmonary disease with (acute) exacerbation: Secondary | ICD-10-CM | POA: Diagnosis not present

## 2023-04-25 DIAGNOSIS — R911 Solitary pulmonary nodule: Secondary | ICD-10-CM | POA: Diagnosis not present

## 2023-04-25 MED ORDER — METHYLPREDNISOLONE ACETATE 80 MG/ML IJ SUSP
120.0000 mg | Freq: Once | INTRAMUSCULAR | Status: AC
  Administered 2023-04-25: 120 mg via INTRAMUSCULAR

## 2023-04-25 NOTE — Progress Notes (Signed)
[name]    [id number]    [birth year]  Primary Care Physician:Osei-Bonsu, Greggory Stallion, MD  Referring Physician: Jackie Plum, MD [address] [place],  [place] [address]  Chief complaint:  Follow-up for  Severe COPD  HPI: 62 year old smoker with very severe COPD, chronic hypoxic respiratory failure.  She is previously followed by Dr. Jamison Neighbor.  Pulmicort and Perforomist were ordered last year but insurance would not cover She was then placed on Symbicort in 2018 but had worsening symptoms of dyspnea, wheeze. Inhalers were switched to Trelegy at office visit in March 2019.    Continues to have chronic cough, dyspnea on exertion, intermittent hemoptysis with mucus Plavix has been held by Dr. Dulce Sellar, cardiology  She was treated for COPD exacerbation around late November 2022.  CT at that time showed some inflammatory changes at the base of the lung Hospitalized for COPD exacerbation in mid February 2023 which was treated with steroids and bronchodilators Got Z-Pak and prednisone in May 2023 for exacerbation She was seen again in clinic in early August 2023 for exacerbation and given another round of Z-Pak and prednisone  Admitted on 12/12/2022 with mild COPD exacerbation.  She was on BiPAP briefly.  Chest x-ray showed clear lungs.  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 Exposures: No mold, hot tub, Jacuzzi. Smoking history: 35-pack-year smoker.  Quit in 2011 Travel history: No significant travel history Relevant family history: No significant family history of lung disease.  Interim history: Discussed the use of AI scribe software for clinical note transcription with the patient, who gave  verbal consent to proceed.  Prescribed Daliresp at last visit but could not tolerated due to insomnia and severe anxiety.  The patient, with a history of COPD, presents following a recent hospitalization in end of Nov 2024 for a COPD exacerbation. She describes persistent breathing difficulties, despite some improvement since her hospital stay. She reports that her breathing issues persist even after using her nebulizer. She was previously prescribed Daliresp, but discontinued it due to side effects including anxiety and insomnia. She is currently taking Trelegy and prednisone daily. She also mentions a spot on her lung that has been monitored, but does not provide further details.  The patient also discusses her inability to attend pulmonary rehab due to scheduling conflicts. She expresses a willingness to participate in group sessions, but the offered times conflict with her work schedule.   Outpatient Encounter Medications as of 04/25/2023  Medication Sig   albuterol (PROVENTIL) (2.5 MG/3ML) 0.083% nebulizer solution Take 3 mLs (2.5 mg total) by nebulization every 6 (six) hours as needed for wheezing or shortness of breath.   albuterol (VENTOLIN HFA) 108 (90 Base) MCG/ACT inhaler INHALE 2 PUFFS INTO THE LUNGS EVERY 4 HOURS AS NEEDED FOR WHEEZING OR SHORTNESS OF BREATH.   Alirocumab (PRALUENT) 150 MG/ML SOAJ Inject 1 mL (150 mg total) into the skin every 14 (fourteen) days. (Patient taking differently: Inject 150 mg into the skin every 14 (fourteen) days. Last dose was  04/07/2023.)   Fluticasone-Umeclidin-Vilant (TRELEGY ELLIPTA) 200-62.5-25 MCG/ACT AEPB Inhale 1 puff into the lungs daily.   Ibuprofen (IBU-200 PO) Take 200-400 mg by mouth every 6 (six) hours as needed (for  pain or headaches).   ipratropium-albuterol (DUONEB) 0.5-2.5 (3) MG/3ML SOLN Take 3 mLs by nebulization every 6 (six) hours as needed.   meclizine (ANTIVERT) 12.5 MG tablet Take 12.5-25 mg by mouth 3 (three) times daily as  needed.   omeprazole (PRILOSEC) 20 MG capsule TAKE 1 CAPSULE BY MOUTH EVERY DAY   OXYGEN Inhale 5 L/min into the lungs See admin instructions. 5 L/min at bedtime and as needed for shortness of breath throughout the daytime   [DISCONTINUED] azithromycin (ZITHROMAX) 500 MG tablet Take 1 tablet (500 mg total) by mouth daily.   [DISCONTINUED] predniSONE (DELTASONE) 10 MG tablet Take 3 tablets by mouth for 3 days then, 2 tabs by mouth for 3 days, then continue 1 tab daily as before .   No facility-administered encounter medications on file as of 04/25/2023.   Physical Exam: Blood pressure 118/74, pulse 86, temperature 98.1 F (36.7 C), temperature source Oral, height 5\' 3"  (1.6 m), weight 160 lb 6.4 oz (72.8 kg), SpO2 96%. Gen:      No acute distress HEENT:  EOMI, sclera anicteric Neck:     No masses; no thyromegaly Lungs:    Mild exp wheeze CV:         Regular rate and rhythm; no murmurs Abd:      + bowel sounds; soft, non-tender; no palpable masses, no distension Ext:    No edema; adequate peripheral perfusion Skin:      Warm and dry; no rash Neuro: alert and oriented x 3 Psych: normal mood and affect   Data Reviewed: Imaging: See Low-dose screening CT 04/19/2021-new areas of bilateral subpleural groundglass consolidation in the lower lobes, coronary artery calcification, enlarged PA, emphysema.  Screening CT 07/15/21 - new large consolidation right middle and right lower lobe  CT 11/01/2021-irregular focus of consolidation in the right middle lobe  CT chest 05/26/2022-small consolidation in the medial right middle lobe unchanged since 09/28/2022, emphysema, chronic bronchitis  Chest x-ray 02/12/2023-increasing interstitial markings, bronchitic changes. I have reviewed the images personally.  PFT 06/02/17 FVC 1.50 [60%], FEV1 0.79 [40%], F/F 53, TLC 96, RV/TLC 167%, DLCO 32% Severe obstruction with bronchodilator response, air-trapping.  Severe diffusion impairment.  11/10/15:   Walked 360 meters / Baseline Sat 96% on RA / Nadir Sat 95% on RA  MICROBIOLOGY Sputum Ctx (11/13/15):  Paecilomyces species / Oral Flora / AFB negative    LABS 03/01/15 HIV:  Negative   09/29/14 ANA:  Negative   05/16/11 Alpha-1 antitrypsin: MM (122)  Cardiac work-up by Dr. Jacinto Halim Nuclear stress test 11/14/16-very small inferoseptal ischemia, preserved LVEF with mild hypokinesis the same region Echocardiogram shows mild pulmonary hypertension with normal LV systolic function.  Assessment:  Severe COPD with acute exacerbation, frequent exacerbations of COPD Chronic Obstructive Pulmonary Disease (COPD) Exacerbation Recent hospitalization for acute on chronic hypoxic and hypercarbic respiratory failure due to COPD exacerbation. Currently on Trelegy and Prednisone 10mg  daily. Still experiencing some wheezing and dyspnea despite nebulizer use. Daliresp was not tolerated due to anxiety and insomnia.  -Increase Prednisone to 20mg  daily for 5 days, then return to 10mg  daily. -Start Ohtuvayre inhaler, pending insurance approval.  Consider chronic azithromycin if this does not work as well. -Consider pulmonary rehabilitation, although scheduling conflicts have been a barrier.  Abnormal CT Continues to have waxing waning nodular opacities most recently seen on Nov 2024 Order follow-up CT in 6 months  Health maintenance Influenza vaccine-states that she got her flu vaccine this year at [facility]. 09/03/2011-Pneumovax  Plan/Recommendations: Continue Trelegy  Increase prednisone Ohtuvayre inhaler, Follow-up CT  Chilton Greathouse MD [facility] 04/25/2023, 10:16 AM  CC: Jackie Plum, MD

## 2023-04-25 NOTE — Progress Notes (Signed)
[name]    [id number]    1960/07/13  Primary Care Physician:Osei-Bonsu, Greggory Stallion, MD  Referring Physician: Jackie Plum, MD [address] [place],  [place] [address]  Chief complaint:  Follow-up for  Severe COPD  HPI: 62 year old smoker with very severe COPD, chronic hypoxic respiratory failure.  She is previously followed by Dr. Jamison Neighbor.  Pulmicort and Perforomist were ordered last year but insurance would not cover She was then placed on Symbicort in 2018 but had worsening symptoms of dyspnea, wheeze. Inhalers were switched to Trelegy at office visit in March 2019.    Continues to have chronic cough, dyspnea on exertion, intermittent hemoptysis with mucus Plavix has been held by Dr. Dulce Sellar, cardiology  She was treated for COPD exacerbation around late November 2022.  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 Exposures: No mold, hot tub, Jacuzzi. Smoking history: 35-pack-year smoker.  Quit in 2011 Travel history: No significant travel history Relevant family history: No significant family history of lung disease.  Interim history: Admitted on 12/12/2022 with mild COPD exacerbation.  She was on BiPAP briefly.  Chest x-ray showed clear lungs.  Treated with IV Solu-Medrol, bronchodilators, Z-Pak with improvement and she is discharged in 2 days Post discharge she is doing well but has occasional wheeze.  She does not like Breztri and is hardly  using it.   Outpatient Encounter Medications as of 04/25/2023  Medication Sig   albuterol (PROVENTIL) (2.5 MG/3ML) 0.083% nebulizer solution Take 3 mLs (2.5 mg total) by nebulization every 6 (six) hours as needed for wheezing or shortness of breath.   albuterol (VENTOLIN HFA) 108 (90 Base) MCG/ACT inhaler INHALE 2 PUFFS INTO THE LUNGS EVERY 4 HOURS AS NEEDED FOR WHEEZING OR SHORTNESS OF BREATH.   Alirocumab (PRALUENT) 150 MG/ML SOAJ Inject 1 mL (150 mg total) into the skin every 14 (fourteen) days. (Patient taking differently: Inject 150 mg into the skin every 14 (fourteen) days. Last dose was  04/07/2023.)   Fluticasone-Umeclidin-Vilant (TRELEGY ELLIPTA) 200-62.5-25 MCG/ACT AEPB Inhale 1 puff into the lungs daily.   Ibuprofen (IBU-200 PO) Take 200-400 mg by mouth every 6 (six) hours as needed (for pain or headaches).   ipratropium-albuterol (DUONEB) 0.5-2.5 (3) MG/3ML SOLN Take 3 mLs by nebulization every 6 (six) hours as needed.   meclizine (ANTIVERT) 12.5 MG tablet Take 12.5-25 mg by mouth 3 (three) times daily as needed.   omeprazole (PRILOSEC) 20 MG capsule TAKE 1 CAPSULE BY MOUTH EVERY DAY   OXYGEN Inhale 5 L/min into the lungs See admin instructions. 5 L/min at bedtime and as needed for shortness of breath throughout the daytime   [DISCONTINUED] azithromycin (ZITHROMAX) 500 MG tablet Take 1 tablet (500 mg total) by mouth daily.   [DISCONTINUED] predniSONE (DELTASONE) 10 MG tablet Take 3 tablets by mouth for 3 days then, 2 tabs by mouth for 3 days, then continue 1 tab daily as before .   No facility-administered encounter medications on file  as of 04/25/2023.   Physical Exam: Blood pressure 112/66, pulse 99, temperature 98 F (36.7 C), temperature source Oral, height 5\' 3"  (1.6 m), weight 149 lb 6.4 oz (67.8 kg), SpO2 92%. Gen:      No acute distress HEENT:  EOMI, sclera anicteric Neck:     No masses; no thyromegaly Lungs:    Bilateral expiratory wheeze CV:         Regular rate and  rhythm; no murmurs Abd:      + bowel sounds; soft, non-tender; no palpable masses, no distension Ext:    No edema; adequate peripheral perfusion Skin:      Warm and dry; no rash Neuro: alert and oriented x 3 Psych: normal mood and affect   Data Reviewed: Imaging: See Low-dose screening CT 04/19/2021-new areas of bilateral subpleural groundglass consolidation in the lower lobes, coronary artery calcification, enlarged PA, emphysema.  Screening CT 07/15/21 - new large consolidation right middle and right lower lobe  CT 11/01/2021-irregular focus of consolidation in the right middle lobe I have reviewed the images personally.  PFT 06/02/17 FVC 1.50 [60%], FEV1 0.79 [40%], F/F 53, TLC 96, RV/TLC 167%, DLCO 32% Severe obstruction with bronchodilator response, air-trapping.  Severe diffusion impairment.  11/10/15:  Walked 360 meters / Baseline Sat 96% on RA / Nadir Sat 95% on RA  MICROBIOLOGY Sputum Ctx (11/13/15):  Paecilomyces species / Oral Flora / AFB negative    LABS 03/01/15 HIV:  Negative   09/29/14 ANA:  Negative   05/16/11 Alpha-1 antitrypsin: MM (122)  Cardiac work-up by Dr. Jacinto Halim Nuclear stress test 11/14/16-very small inferoseptal ischemia, preserved LVEF with mild hypokinesis the same region Echocardiogram shows mild pulmonary hypertension with normal LV systolic function.  Assessment:  Severe COPD with acute exacerbation, frequent exacerbations of COPD She continues to be symptomatic with wheeze and dyspnea Continue breztri, nebs as needed, continue supplemental oxygen Give Depo injection today and prednisone 40 mg a day for 5 days.  At the end of 5 days start on prednisone at 10 mg/day Start Daliresp to reduce frequency of exacerbations Once insurance approves Daliresp and she is stable on that we can try weaning her off the prednisone completely  Consider chronic azithromycin if she continues to have exacerbations  Referral to pulmonary rehab  Abnormal  CT Continues to have waxing waning nodular opacities most recently seen on April 2024 Order follow-up CT in 6 months  Health maintenance Influenza vaccine-states that she got her flu vaccine this year at [facility]. 09/03/2011-Pneumovax  Plan/Recommendations: - Continue breztri - Continue supplemental oxygen - Daliresp, chronic prednisone - Pulmonary rehab - Follow-up CT chest  Chilton Greathouse MD [facility] 04/25/2023, 10:19 AM  CC: Jackie Plum, MD

## 2023-04-25 NOTE — Patient Instructions (Signed)
VISIT SUMMARY:  You were seen today for follow-up after a recent hospitalization due to a COPD exacerbation. You have been experiencing persistent breathing difficulties despite some improvement since your hospital stay. We discussed your current medications and the side effects you experienced with Daliresp. We also talked about your lung nodule and the challenges you face with scheduling pulmonary rehabilitation.  YOUR PLAN:  -CHRONIC OBSTRUCTIVE PULMONARY DISEASE (COPD) EXACERBATION: COPD is a chronic lung disease that makes it hard to breathe. You recently had a worsening of your symptoms, which led to a hospital stay. We will increase your Prednisone to 20mg  daily for 5 days and then reduce it back to 10mg  daily. We will also start you on an Ohtuvayre inhaler, pending insurance approval. Pulmonary rehabilitation is recommended, but we understand scheduling has been difficult for you.  -LUNG NODULE: A lung nodule is a small growth in the lung that needs to be monitored over time. Your lung nodule has been stable on previous imaging. We will schedule a repeat CT scan in May 2025 to continue monitoring it.  INSTRUCTIONS:  Please follow the new Prednisone dosage instructions and start using the [place] inhaler once it is approved by your insurance. We will schedule a repeat CT scan for your lung nodule in May 2025. Consider discussing alternative scheduling options for pulmonary rehabilitation with your provider.

## 2023-04-25 NOTE — Addendum Note (Signed)
Addended by: Christen Butter on: 04/25/2023 11:10 AM   Modules accepted: Orders

## 2023-05-02 ENCOUNTER — Telehealth: Payer: Self-pay | Admitting: Pharmacist

## 2023-05-02 NOTE — Telephone Encounter (Signed)
Received [facility] referral form. Faxed to [employer] with clinicals and insurance card copy  Phone#: [phone] Fax#: [phone]  Chesley Mires, PharmD, MPH, BCPS, CPP Clinical Pharmacist (Rheumatology and Pulmonology)

## 2023-05-16 ENCOUNTER — Ambulatory Visit: Payer: BC Managed Care – PPO | Admitting: Physician Assistant

## 2023-05-22 ENCOUNTER — Emergency Department (HOSPITAL_COMMUNITY): Payer: BC Managed Care – PPO

## 2023-05-22 ENCOUNTER — Emergency Department (HOSPITAL_COMMUNITY)
Admission: EM | Admit: 2023-05-22 | Discharge: 2023-05-22 | Disposition: A | Discharge status: Home / Self Care | Payer: BC Managed Care – PPO | Attending: Emergency Medicine | Admitting: Emergency Medicine

## 2023-05-22 ENCOUNTER — Other Ambulatory Visit: Payer: Self-pay

## 2023-05-22 DIAGNOSIS — E876 Hypokalemia: Secondary | ICD-10-CM | POA: Insufficient documentation

## 2023-05-22 DIAGNOSIS — J441 Chronic obstructive pulmonary disease with (acute) exacerbation: Secondary | ICD-10-CM | POA: Insufficient documentation

## 2023-05-22 DIAGNOSIS — R0602 Shortness of breath: Secondary | ICD-10-CM | POA: Diagnosis present

## 2023-05-22 DIAGNOSIS — Z20822 Contact with and (suspected) exposure to covid-19: Secondary | ICD-10-CM | POA: Diagnosis not present

## 2023-05-22 DIAGNOSIS — D72829 Elevated white blood cell count, unspecified: Secondary | ICD-10-CM | POA: Diagnosis not present

## 2023-05-22 LAB — COMPREHENSIVE METABOLIC PANEL
ALT: 22 U/L (ref 0–44)
AST: 18 U/L (ref 15–41)
Albumin: 3.9 g/dL (ref 3.5–5.0)
Alkaline Phosphatase: 45 U/L (ref 38–126)
Anion gap: 10 (ref 5–15)
BUN: 14 mg/dL (ref 8–23)
CO2: 32 mmol/L (ref 22–32)
Calcium: 8.9 mg/dL (ref 8.9–10.3)
Chloride: 100 mmol/L (ref 98–111)
Creatinine, Ser: 0.64 mg/dL (ref 0.44–1.00)
GFR, Estimated: >60 mL/min (ref >60)
Glucose, Bld: 73 mg/dL (ref 70–99)
Potassium: 3 mmol/L — ABNORMAL LOW (ref 3.5–5.1)
Sodium: 142 mmol/L (ref 135–145)
Total Bilirubin: 0.4 mg/dL (ref 0.0–1.2)
Total Protein: 6.8 g/dL (ref 6.5–8.1)

## 2023-05-22 LAB — RESP PANEL BY RT-PCR (RSV, FLU A&B, COVID)  RVPGX2
Influenza A by PCR: NEGATIVE
Influenza B by PCR: NEGATIVE
Resp Syncytial Virus by PCR: NEGATIVE
SARS Coronavirus 2 by RT PCR: NEGATIVE

## 2023-05-22 LAB — LACTIC ACID, PLASMA: Lactic Acid, Venous: 1.3 mmol/L (ref 0.5–1.9)

## 2023-05-22 LAB — CBC
HCT: 40.6 % (ref 36.0–46.0)
Hemoglobin: 12.4 g/dL (ref 12.0–15.0)
MCH: 28.8 pg (ref 26.0–34.0)
MCHC: 30.5 g/dL (ref 30.0–36.0)
MCV: 94.4 fL (ref 80.0–100.0)
Platelets: 241 10*3/uL (ref 150–400)
RBC: 4.3 MIL/uL (ref 3.87–5.11)
RDW: 14.2 % (ref 11.5–15.5)
WBC: 10.9 10*3/uL — ABNORMAL HIGH (ref 4.0–10.5)
nRBC: 0 % (ref 0.0–0.2)

## 2023-05-22 LAB — TROPONIN I (HIGH SENSITIVITY): Troponin I (High Sensitivity): 9 ng/L (ref <18)

## 2023-05-22 LAB — BRAIN NATRIURETIC PEPTIDE: B Natriuretic Peptide: 94.1 pg/mL (ref 0.0–100.0)

## 2023-05-22 MED ORDER — IPRATROPIUM-ALBUTEROL 0.5-2.5 (3) MG/3ML IN SOLN
3.0000 mL | Freq: Once | RESPIRATORY_TRACT | Status: AC
  Administered 2023-05-22: 3 mL via RESPIRATORY_TRACT
  Filled 2023-05-22: qty 3

## 2023-05-22 MED ORDER — METHYLPREDNISOLONE SODIUM SUCC 125 MG IJ SOLR
125.0000 mg | Freq: Once | INTRAMUSCULAR | Status: AC
Start: 2023-05-22 — End: 2023-05-22
  Administered 2023-05-22: 125 mg via INTRAVENOUS
  Filled 2023-05-22: qty 2

## 2023-05-22 MED ORDER — POTASSIUM CHLORIDE CRYS ER 20 MEQ PO TBCR: 20.0000 meq | EXTENDED_RELEASE_TABLET | Freq: Every day | ORAL | 0 refills | Status: DC

## 2023-05-22 MED ORDER — ALBUTEROL SULFATE HFA 108 (90 BASE) MCG/ACT IN AERS: 2.0000 | INHALATION_SPRAY | RESPIRATORY_TRACT | Status: DC | PRN

## 2023-05-22 MED ORDER — AZITHROMYCIN 250 MG PO TABS: 250.0000 mg | ORAL_TABLET | Freq: Every day | ORAL | 0 refills | Status: DC

## 2023-05-22 MED ORDER — PREDNISONE 20 MG PO TABS: ORAL_TABLET | ORAL | 0 refills | Status: DC

## 2023-05-22 MED ORDER — ALBUTEROL SULFATE HFA 108 (90 BASE) MCG/ACT IN AERS: 2.0000 | INHALATION_SPRAY | RESPIRATORY_TRACT | 0 refills | Status: DC | PRN

## 2023-05-22 MED ORDER — POTASSIUM CHLORIDE CRYS ER 20 MEQ PO TBCR
40.0000 meq | EXTENDED_RELEASE_TABLET | Freq: Once | ORAL | Status: AC
  Administered 2023-05-22: 40 meq via ORAL
  Filled 2023-05-22: qty 2

## 2023-05-22 NOTE — Discharge Instructions (Signed)
 Use your albuterol  every 4 hours, start the antibiotic (azithromycin ) today when you pick it up.  Start the steroid prescription (prednisone ) tomorrow as you were given a dose of steroids in the emergency department.  If you need your albuterol  significantly more than every 4 hours, you develop new or worsening shortness of breath, coughing up blood, fever, chest pain, or any other new/concerning symptoms then return to the ER or call 911.

## 2023-05-22 NOTE — ED Provider Triage Note (Signed)
 Emergency Medicine Provider Triage Evaluation Note  [name] , a 63 y.o. female  was evaluated in triage.  Pt complains of SOB.  Review of Systems  Positive:  Negative:   Physical Exam  BP (!) 145/90 (BP Location: Left Arm)   Pulse 91   Temp 97.6 F (36.4 C) (Oral)   Resp (!) 27   Ht 5' 3 (1.6 m)   Wt 72.6 kg   SpO2 100%   BMI 28.34 kg/m  Gen:   Awake, no distress   Resp:  Normal effort  MSK:   Moves extremities without difficulty  Other:    Medical Decision Making  Medically screening exam initiated at 11:34 AM.  Appropriate orders placed.  [name] was informed that the remainder of the evaluation will be completed by another provider, this initial triage assessment does not replace that evaluation, and the importance of remaining in the ED until their evaluation is complete.  COPD and baseline 4L [place]. Started feeling her COPD worsen last night, tried albuterol  inhaler and nebulizer without relief. Symptoms became severe around 10:30AM this morning. Patient does appear to be in respiratory distress but is able to speak full sentences. Currently on 10L [place] and O2 100%.   [name], [place] 05/22/23 1137

## 2023-05-22 NOTE — Progress Notes (Signed)
 Per MD Criss Alvine, ABG for 1131 has been discontinued. RT unable to discontinue order in EPIC at this time. RN aware order has been discontinued.

## 2023-05-22 NOTE — ED Provider Notes (Signed)
 [place] EMERGENCY DEPARTMENT AT [facility] Provider Note   CSN: [id number] Arrival date & time: 05/22/23  1056     History  Chief Complaint  Patient presents with   Shortness of Breath    [name] is a 63 y.o. female.  HPI 63 year old female presents with shortness of breath. She has a history of COPD and at times wears 5L of oxygen . Since last night she has had a cough and felt short of breath. She's having some yellow sputum and wheezing. No fevers, chest pain. She was given a couple duonebs prior to me seeing her and now feels almost back to baseline. No leg swelling. She is currently wearing her O2, but wasn't when she arrived and is feeling better. 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  Home Medications Prior to Admission medications   Medication Sig Start Date End Date Taking? Authorizing Provider  albuterol  (VENTOLIN  HFA) 108 (90 Base) MCG/ACT inhaler Inhale 2 puffs into the lungs every 4 (four) hours as needed for wheezing or shortness of breath. 05/22/23  Yes Freddi Hamilton, MD  azithromycin  (ZITHROMAX ) 250 MG tablet Take 1 tablet (250 mg total) by mouth daily. Take first 2 tablets together, then 1 every day until finished. 05/22/23  Yes Freddi Hamilton, MD  predniSONE  (DELTASONE ) 20 MG tablet 2 tabs po daily x 4 days 05/23/23  Yes Freddi Hamilton, MD  albuterol  (PROVENTIL ) (2.5 MG/3ML) 0.083% nebulizer solution Take 3 mLs (2.5 mg total) by nebulization every 6 (six) hours as needed for wheezing or shortness of breath. 04/05/23   Mannam, Praveen, MD  albuterol  (VENTOLIN  HFA) 108 (90 Base) MCG/ACT inhaler INHALE 2 PUFFS INTO THE LUNGS EVERY 4 HOURS AS NEEDED FOR WHEEZING OR SHORTNESS OF BREATH. 03/29/23   Mannam, Praveen, MD  Alirocumab  (PRALUENT ) 150 MG/ML SOAJ Inject 1 mL (150 mg total) into the skin every 14 (fourteen) days. Patient taking  differently: Inject 150 mg into the skin every 14 (fourteen) days. Last dose was  04/07/2023. 12/05/22   Revankar, Rajan R, MD  Fluticasone -Umeclidin-Vilant (TRELEGY ELLIPTA ) 200-62.5-25 MCG/ACT AEPB Inhale 1 puff into the lungs daily. 03/07/23   Parrett, Madelin RAMAN, NP  Ibuprofen  (IBU-200 PO) Take 200-400 mg by mouth every 6 (six) hours as needed (for pain or headaches).    [provider]  ipratropium-albuterol  (DUONEB) 0.5-2.5 (3) MG/3ML SOLN Take 3 mLs by nebulization every 6 (six) hours as needed. 03/24/23   Geronimo Amel, MD  meclizine (ANTIVERT) 12.5 MG tablet Take 12.5-25 mg by mouth 3 (three) times daily as needed. 03/29/23   [provider]  omeprazole  (PRILOSEC) 20 MG capsule TAKE 1 CAPSULE BY MOUTH EVERY DAY 03/24/23   Hope Almarie ORN, NP  OXYGEN  Inhale 5 L/min into the lungs See admin instructions. 5 L/min at bedtime and as needed for shortness of breath throughout the daytime    [provider]      Allergies    Amoxicillin , Statins, Tylenol  [acetaminophen ], and Zetia  [ezetimibe ]    Review of Systems   Review of Systems  Constitutional:  Negative for fever.  HENT:  Negative for sore throat.   Respiratory:  Positive for cough, shortness of breath and wheezing.   Cardiovascular:  Negative for chest pain and leg swelling.    Physical Exam Updated Vital Signs BP (!) 145/90 (BP Location: Left Arm)  Pulse 91   Temp 97.6 F (36.4 C) (Oral)   Resp (!) 27   Ht 5' 3 (1.6 m)   Wt 72.6 kg   SpO2 100%   BMI 28.34 kg/m  Physical Exam Vitals and nursing note reviewed.  Constitutional:      Appearance: She is well-developed.  HENT:     Head: Normocephalic and atraumatic.  Cardiovascular:     Rate and Rhythm: Normal rate and regular rhythm.     Heart sounds: Normal heart sounds.  Pulmonary:     Effort: Pulmonary effort is normal. No tachypnea or accessory muscle usage.     Breath sounds: Wheezing (mild, expiratory, diffuse) present.  Abdominal:      Palpations: Abdomen is soft.     Tenderness: There is no abdominal tenderness.  Musculoskeletal:     Right lower leg: No edema.     Left lower leg: No edema.  Skin:    General: Skin is warm and dry.  Neurological:     Mental Status: She is alert.     ED Results / Procedures / Treatments   Labs (all labs ordered are listed, but only abnormal results are displayed) Labs Reviewed  CBC - Abnormal; Notable for the following components:      Result Value   WBC 10.9 (*)    All other components within normal limits  COMPREHENSIVE METABOLIC PANEL - Abnormal; Notable for the following components:   Potassium 3.0 (*)    All other components within normal limits  RESP PANEL BY RT-PCR (RSV, FLU A&B, COVID)  RVPGX2  CULTURE, BLOOD (ROUTINE X 2)  CULTURE, BLOOD (ROUTINE X 2)  BRAIN NATRIURETIC PEPTIDE  LACTIC ACID, PLASMA  TROPONIN I (HIGH SENSITIVITY)    EKG EKG Interpretation Date/Time:  Monday May 22 2023 13:22:57 EST Ventricular Rate:  70 PR Interval:  135 QRS Duration:  85 QT Interval:  413 QTC Calculation: 446 R Axis:   86  Text Interpretation: Sinus rhythm Borderline right axis deviation no acute ST/T changes similar to Nov 2024 Confirmed by Freddi Hamilton [phone]) on 05/22/2023 1:43:48 PM  Radiology DG Chest Port 1 View Result Date: 05/22/2023 CLINICAL DATA:  Shortness of breath. COPD exacerbation that began yesterday. EXAM: PORTABLE CHEST 1 VIEW COMPARISON:  Chest radiographs 04/14/2023, 12/12/2022, 09/29/2022; CT chest 03/27/2023 FINDINGS: Cardiac silhouette is again mildly enlarged. Mediastinal contours are within normal limits. There is again increased lucency within the upper lungs and attenuation of the pulmonary vascularity is seen with chronic emphysematous changes. There is bilateral lower lung horizontal linear chronic scarring, unchanged from prior radiographs and CT. No new acute airspace opacity is seen. No pleural effusion pneumothorax. Mild-to-moderate  multilevel degenerative disc changes of the thoracic spine. IMPRESSION: 1. No acute cardiopulmonary process. 2. Chronic emphysematous changes and bilateral lower lung chronic scarring. Electronically Signed   By: Tanda Lyons M.D.   On: 05/22/2023 13:45    Procedures Procedures    Medications Ordered in ED Medications  albuterol  (VENTOLIN  HFA) 108 (90 Base) MCG/ACT inhaler 2 puff (has no administration in time range)  ipratropium-albuterol  (DUONEB) 0.5-2.5 (3) MG/3ML nebulizer solution 3 mL (3 mLs Nebulization Given 05/22/23 1138)  methylPREDNISolone  sodium succinate (SOLU-MEDROL ) 125 mg/2 mL injection 125 mg (125 mg Intravenous Given 05/22/23 1221)  ipratropium-albuterol  (DUONEB) 0.5-2.5 (3) MG/3ML nebulizer solution 3 mL (3 mLs Nebulization Given 05/22/23 1221)  ipratropium-albuterol  (DUONEB) 0.5-2.5 (3) MG/3ML nebulizer solution 3 mL (3 mLs Nebulization Given 05/22/23 1221)  potassium chloride  SA (KLOR-CON  M) CR tablet 40  mEq (40 mEq Oral Given 05/22/23 1440)    ED Course/ Medical Decision Making/ A&P                                 Medical Decision Making Amount and/or Complexity of Data Reviewed Labs: ordered.    Details: Mild leukocytosis.  Hypokalemia Radiology: ordered and independent interpretation performed.    Details: No pneumonia ECG/medicine tests: ordered and independent interpretation performed.    Details: No ischemia  Risk Prescription drug management.   Patient presents with a COPD exacerbation.  When she was put on her home oxygen  at 5 L as well as given DuoNebs and Solu-Medrol  she has started to feel dramatically better.  She feels like she is essentially breathing at baseline.  She has some mild wheezing on my repeat exam though her work of breathing has significantly improved.  She wants to go home.  I think this is reasonable and think she should go home with a steroid burst.  Given the COPD exacerbation with sputum changes I think is also reasonable to give some  azithromycin .  Otherwise, she feels well enough for discharge and will be discharged home with return precautions.  Will replete her potassium and she is requesting a refill of her inhaler.        Final Clinical Impression(s) / ED Diagnoses Final diagnoses:  COPD exacerbation (HCC)    Rx / DC Orders ED Discharge Orders          Ordered    predniSONE  (DELTASONE ) 20 MG tablet        05/22/23 1455    albuterol  (VENTOLIN  HFA) 108 (90 Base) MCG/ACT inhaler  Every 4 hours PRN        05/22/23 1455    azithromycin  (ZITHROMAX ) 250 MG tablet  Daily        05/22/23 1455              Freddi Hamilton, MD 05/22/23 1518

## 2023-05-22 NOTE — ED Triage Notes (Signed)
 Pt arrived via POV. C/o COPD exacerabtion that began yesterday. Home breathing tx did not help. Pt has grunting expirations, satting at 78 on RA.  Placed on 10 L NRB.  AOx4

## 2023-05-22 NOTE — Progress Notes (Signed)
 PT in room 23- from Triage. Currently on 5 LPM 02 (5 LPM Dep) with Sp02 100, RR 22, HR 77, BBS clear-diminished. PT states that she is breathing much better than on arrival, no respiratory distress noted at this time. PT states that 1-10 (10 being hardest to breath) she is currently a 5. RN is at bedside and agrees with assessment. RT will discuss with MD concerning order BiPAP and ABG. RN aware.

## 2023-05-23 ENCOUNTER — Other Ambulatory Visit: Payer: Self-pay | Admitting: Primary Care

## 2023-05-23 ENCOUNTER — Telehealth: Payer: Self-pay | Admitting: Pharmacist

## 2023-05-23 NOTE — Telephone Encounter (Signed)
 Please call PT about PA [phone]. Needs her medication. TY.

## 2023-05-23 NOTE — Telephone Encounter (Signed)
 Patient states that pharmacy has reached out numerous times regarding PA. Advised we haven't received anything.  Called [facility] for update on where her rx was triaged  Rx was tri[facility] to [facility]. Collected [facility] and renewed key as it looks like clinical questions expires  Submitted a Prior Authorization request to [facility] for OHTUVAYRE  via CoverMyMeds. Will update once we receive a response.  Old CMM Key:  [id number] New key: [id number]

## 2023-05-23 NOTE — Telephone Encounter (Signed)
 ATC patient regarding what medication this is about. Patient states this is in regards to Ohtuvayre  and pharmacy has reached out numerous times. Advised we haven't received anything.  Called [facility] for update on where her rx was triaged  Rx was tri[facility] to [facility]. Collected PA key from [facility] and renewed key as it looks like clinical questions expires  Submitted a Prior Authorization request to [facility] for OHTUVAYRE  via CoverMyMeds. Will update once we receive a response.  Old CMM Key:  [id number] New key: [id number]

## 2023-05-24 NOTE — Telephone Encounter (Signed)
 Received notification from [facility] regarding a prior authorization for OHTUVAYRE . Authorization has been APPROVED from 05/24/2023 to 05/23/2024. Approval letter sent to scan center.  Authorization # [id number]  Sherry Pennant, PharmD, MPH, BCPS, CPP Clinical Pharmacist (Rheumatology and Pulmonology)

## 2023-05-25 ENCOUNTER — Telehealth: Payer: Self-pay | Admitting: Pulmonary Disease

## 2023-05-25 NOTE — Telephone Encounter (Signed)
 Pt is requesting refills. Lov does not state to continue Rx. Please advise.

## 2023-05-25 NOTE — Telephone Encounter (Signed)
 Please see last note - PA is approved for [facility]. Called patient to advise

## 2023-05-25 NOTE — Telephone Encounter (Signed)
 Please see last signed encounter. PT calling to check status of PA. Her# is [phone]

## 2023-05-27 LAB — CULTURE, BLOOD (ROUTINE X 2)
Culture: NO GROWTH
Culture: NO GROWTH
Special Requests: ADEQUATE

## 2023-05-29 NOTE — Telephone Encounter (Signed)
 Patient needs prescription of Ohtuvayre to be sent to [facility] [address]

## 2023-05-30 NOTE — Telephone Encounter (Signed)
 Rx was sent to [facility]. Called pharmacy for Ohtuvayre  rx status. Per rep at [facility],they reprocessed claim with copay that rep was unable to provide (only disclosed to patient0. They will reach out to patient to schedule shipment  Sherry Pennant, PharmD, MPH, BCPS, CPP Clinical Pharmacist (Rheumatology and Pulmonology)

## 2023-05-31 ENCOUNTER — Other Ambulatory Visit: Payer: BC Managed Care – PPO

## 2023-06-12 ENCOUNTER — Other Ambulatory Visit (HOSPITAL_COMMUNITY): Payer: Self-pay

## 2023-06-12 ENCOUNTER — Telehealth: Payer: Self-pay | Admitting: Pulmonary Disease

## 2023-06-12 ENCOUNTER — Telehealth: Payer: Self-pay

## 2023-06-12 ENCOUNTER — Ambulatory Visit: Payer: BC Managed Care – PPO | Admitting: Pulmonary Disease

## 2023-06-12 NOTE — Telephone Encounter (Signed)
*  Pulm  Pharmacy Patient Advocate Encounter   Received notification from CoverMyMeds that prior authorization for Albuterol Sulfate HFA 108 (90 Base)MCG/ACT aerosol  is required/requested.   Insurance verification completed.   The patient is insured through [facility] .   Per test claim:  8.5 gram Albuterol Sulfate HFA is preferred by the insurance.  If suggested medication is appropriate, Please send in a new RX and discontinue this one. If not, please advise as to why it's not appropriate so that we may request a Prior Authorization. Please note, some preferred medications may still require a PA.  If the suggested medications have not been trialed and there are no contraindications to their use, the PA will not be submitted, as it will not be approved.

## 2023-06-12 NOTE — Telephone Encounter (Signed)
Patients insurance covers the generic Pro-Air or Albuterol 8.5 HFA inahler at this time

## 2023-06-12 NOTE — Telephone Encounter (Signed)
Patient also wants to know if she can take all her other medications while also taking Ohtuvayre . Please call and advise [phone]

## 2023-06-12 NOTE — Telephone Encounter (Signed)
Patient needs rescue inhaler ventolin or pro-air. Which ever one that may be good for her to use and will be approved by her insurance. She is without a rescue inhaler.    [facility] [address]

## 2023-06-12 NOTE — Telephone Encounter (Addendum)
This message regarding Ventolin/Proair needs to be sent as a separate phone encounter. This encounter is for [facility]. Teams message sent to Early Chars, PharmD, MPH, BCPS, CPP Clinical Pharmacist (Rheumatology and Pulmonology)

## 2023-06-12 NOTE — Telephone Encounter (Signed)
Prior Auth team says the following:  Patients insurance covers the generic Pro-Air or Albuterol 8.5 HFA inahler at this time     [facility] [address] She also wonders if she should continue on Trelegy. Pls call PT @ [phone]

## 2023-06-12 NOTE — Telephone Encounter (Signed)
PT calling now about Ventolin. States her pharm says she needs PA for this or Proaire as a substitute. Her # is [phone]  Ilda Basset is unknown because PT disconnected.

## 2023-06-12 NOTE — Telephone Encounter (Signed)
Called the pt and there was no answer and her VM not set up  Will have to call her back

## 2023-06-13 ENCOUNTER — Encounter: Payer: Self-pay | Admitting: Pulmonary Disease

## 2023-06-13 NOTE — Telephone Encounter (Signed)
I called and spoke with the pt. Pt states she is taking Ohtuvayre and would like to know if she should continue taking Trelegy, omeprozole, albuterol, duoneb or if her pulmonologist wants her to stop taking certain ones. Please advise.

## 2023-06-14 MED ORDER — ALBUTEROL SULFATE HFA 108 (90 BASE) MCG/ACT IN AERS: 1.0000 | INHALATION_SPRAY | RESPIRATORY_TRACT | 2 refills | Status: DC | PRN

## 2023-06-14 MED ORDER — TRELEGY ELLIPTA 200-62.5-25 MCG/ACT IN AEPB: 1.0000 | INHALATION_SPRAY | Freq: Every day | RESPIRATORY_TRACT | 11 refills | Status: DC

## 2023-06-14 NOTE — Telephone Encounter (Signed)
Patient needs Albuterol 8.5 HFA inahler [facility] [address]. She is without a rescue inhaler.

## 2023-06-14 NOTE — Telephone Encounter (Signed)
I have sent rx for al[facility]erol HFA to [facility] [address]  Pt should continue Trelegy per LOV- I have refilled this as well   Tried calling the pt and there was no answer and no VM picked up  Will call back

## 2023-06-14 NOTE — Telephone Encounter (Signed)
Pt notified. NFN

## 2023-06-14 NOTE — Telephone Encounter (Signed)
I did send the patient a message to continue taking ohtuvayre along with other medications for COPD, it is an add-on medication to inhalers

## 2023-06-16 ENCOUNTER — Telehealth: Payer: Self-pay | Admitting: Pharmacy Technician

## 2023-06-16 ENCOUNTER — Other Ambulatory Visit (HOSPITAL_COMMUNITY): Payer: Self-pay

## 2023-06-16 NOTE — Telephone Encounter (Signed)
Pharmacy Patient Advocate Encounter   Received notification from Fax that prior authorization for Praluent 150MG /ML auto-injectors is required/requested.   Insurance verification completed.   The patient is insured through [facility] .   Per test claim: PA required; PA submitted to above mentioned insurance via CoverMyMeds Key/confirmation #/EOC [facility] Status is pending   NEXT RFL [id number] DAYS TO RFL 16 LAST FILL [id number]

## 2023-06-16 NOTE — Telephone Encounter (Signed)
 NFN

## 2023-06-19 NOTE — Telephone Encounter (Signed)
Pharmacy Patient Advocate Encounter  Received notification from [facility] that Prior Authorization for  Praluent 150MG /ML auto-injectors  has been APPROVED from 06/02/23 to further notice. Unable to obtain price due to refill too soon rejection, last fill date 06/12/23 next available fill date2/16/25   PA #/Case ID/Reference #:  [id number]

## 2023-06-22 ENCOUNTER — Ambulatory Visit
Admission: RE | Admit: 2023-06-22 | Discharge: 2023-06-22 | Disposition: A | Discharge status: Home / Self Care | Payer: BC Managed Care – PPO | Source: Ambulatory Visit | Attending: Pulmonary Disease | Admitting: Pulmonary Disease

## 2023-06-22 DIAGNOSIS — J449 Chronic obstructive pulmonary disease, unspecified: Secondary | ICD-10-CM

## 2023-07-03 ENCOUNTER — Encounter: Payer: Self-pay | Admitting: Cardiology

## 2023-07-05 ENCOUNTER — Ambulatory Visit: Payer: BC Managed Care – PPO | Admitting: Cardiology

## 2023-07-11 ENCOUNTER — Telehealth: Payer: Self-pay | Admitting: Pulmonary Disease

## 2023-07-11 DIAGNOSIS — R911 Solitary pulmonary nodule: Secondary | ICD-10-CM

## 2023-07-11 DIAGNOSIS — J181 Lobar pneumonia, unspecified organism: Secondary | ICD-10-CM

## 2023-07-11 NOTE — Telephone Encounter (Signed)
 Called patient and reviewed CT scan results which are finalized on 07/10/2023 showing new consolidative changes in the right lower lobe suggestive of infection, pneumonia She had not been feeling well for few days and was seen at urgent care yesterday and told she had a pneumonia.  She was given prednisone and levofloxacin.  She has not filled the levofloxacin prescription as she is worried about side effects.  Advised her to start taking the medication and given CT findings and she will get on it today  She is also on Trelegy, recently started [facility] nebulizer and she says that this new medication is helping her symptoms overall.  Follow-up CT ordered in 3 months to ensure resolution

## 2023-07-14 ENCOUNTER — Other Ambulatory Visit: Payer: Self-pay | Admitting: Physician Assistant

## 2023-07-14 DIAGNOSIS — Z7952 Long term (current) use of systemic steroids: Secondary | ICD-10-CM

## 2023-07-19 ENCOUNTER — Other Ambulatory Visit: Payer: Self-pay

## 2023-07-20 ENCOUNTER — Ambulatory Visit: Payer: BC Managed Care – PPO | Attending: Cardiology | Admitting: Cardiology

## 2023-07-20 ENCOUNTER — Encounter: Payer: Self-pay | Admitting: Cardiology

## 2023-07-20 VITALS — BP 108/70 | HR 84 | Ht 63.0 in | Wt 153.0 lb

## 2023-07-20 DIAGNOSIS — E782 Mixed hyperlipidemia: Secondary | ICD-10-CM

## 2023-07-20 DIAGNOSIS — J449 Chronic obstructive pulmonary disease, unspecified: Secondary | ICD-10-CM | POA: Diagnosis not present

## 2023-07-20 DIAGNOSIS — I2510 Atherosclerotic heart disease of native coronary artery without angina pectoris: Secondary | ICD-10-CM | POA: Diagnosis not present

## 2023-07-20 DIAGNOSIS — Z87891 Personal history of nicotine dependence: Secondary | ICD-10-CM

## 2023-07-20 DIAGNOSIS — I700 Atherosclerosis of aorta: Secondary | ICD-10-CM
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
## 2023-07-20 NOTE — Patient Instructions (Signed)
 Medication Instructions:  Your physician recommends that you continue on your current medications as directed. Please refer to the Current Medication list given to you today.  *If you need a refill on your cardiac medications before your next appointment, please call your pharmacy*   Lab Work: None Ordered If you have labs (blood work) drawn today and your tests are completely normal, you will receive your results only by: MyChart Message (if you have MyChart) OR A paper copy in the mail If you have any lab test that is abnormal or we need to change your treatment, we will call you to review the results.   Testing/Procedures: None Ordered   Follow-Up: At [facility], you and your health needs are our priority.  As part of our continuing mission to provide you with exceptional heart care, we have created designated Provider Care Teams.  These Care Teams include your primary Cardiologist (physician) and Advanced Practice Providers (APPs -  Physician Assistants and Nurse Practitioners) who all work together to provide you with the care you need, when you need it.  We recommend signing up for the patient portal called "MyChart".  Sign up information is provided on this After Visit Summary.  MyChart is used to connect with patients for Virtual Visits (Telemedicine).  Patients are able to view lab/test results, encounter notes, upcoming appointments, etc.  Non-urgent messages can be sent to your provider as well.   To learn more about what you can do with MyChart, go to [email or web address].    Your next appointment: 1 year follow up

## 2023-07-21 ENCOUNTER — Telehealth: Payer: Self-pay | Admitting: Cardiology

## 2023-07-21 DIAGNOSIS — I2510 Atherosclerotic heart disease of native coronary artery without angina pectoris: Secondary | ICD-10-CM

## 2023-07-21 MED ORDER — NITROGLYCERIN 0.4 MG SL SUBL: 0.4000 mg | SUBLINGUAL_TABLET | SUBLINGUAL | 6 refills | Status: DC | PRN

## 2023-07-21 NOTE — Telephone Encounter (Signed)
 RX has been sent.

## 2023-07-21 NOTE — Telephone Encounter (Signed)
 Pt called in asking if she can have an RX for Nitroglycerin sent to [facility]. Her previous prescription expired.

## 2023-07-23 ENCOUNTER — Other Ambulatory Visit: Payer: Self-pay | Admitting: Primary Care

## 2023-07-24 ENCOUNTER — Ambulatory Visit: Payer: BC Managed Care – PPO | Admitting: Podiatry

## 2023-07-24 NOTE — Telephone Encounter (Signed)
 Request of Rx not in last office note

## 2023-07-31 ENCOUNTER — Ambulatory Visit: Admitting: Podiatry

## 2023-08-08 ENCOUNTER — Telehealth: Payer: Self-pay | Admitting: Podiatry

## 2023-08-08 NOTE — Telephone Encounter (Signed)
 Patient called to cancel her appt today. I saw she cancelled 2 other visits as a New Pt. I explained to patient that we don't schedule after 3 cancellations.

## 2023-08-14 ENCOUNTER — Ambulatory Visit (INDEPENDENT_AMBULATORY_CARE_PROVIDER_SITE_OTHER): Admitting: Podiatry

## 2023-08-14 ENCOUNTER — Ambulatory Visit: Admitting: Podiatry

## 2023-08-14 ENCOUNTER — Telehealth: Payer: Self-pay | Admitting: Pulmonary Disease

## 2023-08-14 DIAGNOSIS — L84 Corns and callosities: Secondary | ICD-10-CM | POA: Diagnosis not present

## 2023-08-14 DIAGNOSIS — M21611 Bunion of right foot: Secondary | ICD-10-CM | POA: Diagnosis not present

## 2023-08-14 DIAGNOSIS — D1723 Benign lipomatous neoplasm of skin and subcutaneous tissue of right leg: Secondary | ICD-10-CM

## 2023-08-14 DIAGNOSIS — M67471 Ganglion, right ankle and foot: Secondary | ICD-10-CM
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
## 2023-08-14 NOTE — Telephone Encounter (Signed)
 Received forms from [employer], placed in Dr. Shirlee More box

## 2023-08-14 NOTE — Telephone Encounter (Signed)
 Forms have been placed in Dr. Isaiah Serge sign folder.

## 2023-08-16 NOTE — Telephone Encounter (Signed)
 Forms have been given to Dr. Isaiah Serge.

## 2023-08-21 ENCOUNTER — Ambulatory Visit (INDEPENDENT_AMBULATORY_CARE_PROVIDER_SITE_OTHER): Admitting: Ophthalmology

## 2023-08-21 ENCOUNTER — Encounter (INDEPENDENT_AMBULATORY_CARE_PROVIDER_SITE_OTHER): Payer: Self-pay | Admitting: Ophthalmology

## 2023-08-21 DIAGNOSIS — Z7984 Long term (current) use of oral hypoglycemic drugs: Secondary | ICD-10-CM

## 2023-08-21 DIAGNOSIS — H25813 Combined forms of age-related cataract, bilateral: Secondary | ICD-10-CM | POA: Diagnosis not present

## 2023-08-21 DIAGNOSIS — E119 Type 2 diabetes mellitus without complications: Secondary | ICD-10-CM

## 2023-08-21 NOTE — Telephone Encounter (Signed)
 Dr. Isaiah Serge, please advise if forms have been returned to [facility]?
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
## 2023-08-22 NOTE — Telephone Encounter (Signed)
 FMLA forms have been placed back in 'FLMA box'

## 2023-08-23 NOTE — Telephone Encounter (Signed)
 Fax'd FMLA forme to [phone]. Rec'd confirmation. Made 2 copies. One to PT one to accordian file

## 2023-08-28 NOTE — Telephone Encounter (Signed)
 Copied from CRM [id number]. Topic: General - Other >> Aug 28, 2023  2:26 PM Isabell A wrote: Reason for CRM: Patient is calling in regard to her FMLA paperwork - states it says 4 times a year for 6 hours, which is incorrect.   Callback number: [phone]   Patient states FMLA paperwork should say 3-4 times a month, if she stays out one day she can be out for the next three days. Please make documentation the same as her previous FMLA paperwork from 12/29/23(located in Media).

## 2023-08-30 NOTE — Telephone Encounter (Signed)
 Called and spoke to patient. She stated that the dates on her FMLA paperwork is incorrect. She stated that it states 4 times per a year Per pt, it should state 3-4x per month lasting 4 hours.  Pt will have [employer] fax over a new form to be completed.I have provided pt with my direct fax number.  This form needs to be completed and returned by 4/21.

## 2023-08-30 NOTE — Telephone Encounter (Signed)
 Copied from CRM [id number]. Topic: General - Other >> Aug 30, 2023 10:53 AM Crist Dominion wrote: Reason for CRM:  Patient is requesting a phone call back from Janas Cozine, CMA  as she didn't write down Brisha Mccabe's direct fax number correctly.  I spoke to patient and provided her with fax number.

## 2023-08-31 ENCOUNTER — Telehealth: Payer: Self-pay | Admitting: *Deleted

## 2023-08-31 ENCOUNTER — Other Ambulatory Visit: Payer: Self-pay | Admitting: Pulmonary Disease

## 2023-08-31 ENCOUNTER — Telehealth: Payer: Self-pay | Admitting: Cardiology

## 2023-08-31 DIAGNOSIS — I700 Atherosclerosis of aorta: Secondary | ICD-10-CM

## 2023-08-31 DIAGNOSIS — E7800 Pure hypercholesterolemia, unspecified: Secondary | ICD-10-CM

## 2023-08-31 DIAGNOSIS — E782 Mixed hyperlipidemia: Secondary | ICD-10-CM

## 2023-08-31 DIAGNOSIS — I2510 Atherosclerotic heart disease of native coronary artery without angina pectoris: Secondary | ICD-10-CM

## 2023-08-31 DIAGNOSIS — Z789 Other specified health status: Secondary | ICD-10-CM

## 2023-08-31 DIAGNOSIS — G720 Drug-induced myopathy: Secondary | ICD-10-CM

## 2023-08-31 NOTE — Telephone Encounter (Signed)
 Copied from CRM [id number]. Topic: General - Other >> Aug 31, 2023  3:13 PM Alverda Joe S wrote: Most Recent Primary Care Visit:   Medication: albuterol (VENTOLIN HFA) 108 (90 Base) MCG/ACT inhaler  Has the patient contacted their pharmacy? No (Agent: If no, request that the patient contact the pharmacy for the refill. If patient does not wish to contact the pharmacy document the reason why and proceed with request.) (Agent: If yes, when and what did the pharmacy advise?)  Is this the correct pharmacy for this prescription? Yes If no, delete pharmacy and type the correct one.  This is the patient's preferred pharmacy:  [facility] [address] [place] [facility] [address] [place] [address] Phone: [phone] Fax: [phone]    Has the prescription been filled recently? Yes  Is the patient out of the medication? Yes  Has the patient been seen for an appointment in the last year OR does the patient have an upcoming appointment? Yes  Can we respond through MyChart? No  Agent: Please be advised that Rx refills may take up to 3 business days. We ask that you follow-up with your pharmacy.

## 2023-08-31 NOTE — Telephone Encounter (Signed)
*  STAT* If patient is at the pharmacy, call can be transferred to refill team.   1. Which medications need to be refilled? (please list name of each medication and dose if known)   Alirocumab (PRALUENT) 150 MG/ML SOAJ     4. Which pharmacy/location (including street and city if local pharmacy) is medication to be sent to?  [id number] [place], [place] - [address]     5. Do they need a 30 day or 90 day supply? 90

## 2023-08-31 NOTE — Telephone Encounter (Signed)
 Copied from CRM [id number]. Topic: General - Other >> Aug 31, 2023  3:11 PM Alverda Joe S wrote: Reason for CRM: patient requesting sample of albuterol inhaler. Please call patient if available.  I called and spoke with the pt and notified her that we do not carry albuterol inhaler samples  She verbalized understanding  She has already received rx for her pharmacy for this  Nothing further needed

## 2023-09-01 ENCOUNTER — Telehealth: Payer: Self-pay | Admitting: Cardiology

## 2023-09-01 ENCOUNTER — Other Ambulatory Visit: Payer: Self-pay | Admitting: Cardiology

## 2023-09-01 DIAGNOSIS — G720 Drug-induced myopathy: Secondary | ICD-10-CM

## 2023-09-01 DIAGNOSIS — I700 Atherosclerosis of aorta: Secondary | ICD-10-CM

## 2023-09-01 DIAGNOSIS — E7800 Pure hypercholesterolemia, unspecified: Secondary | ICD-10-CM

## 2023-09-01 DIAGNOSIS — I2510 Atherosclerotic heart disease of native coronary artery without angina pectoris: Secondary | ICD-10-CM

## 2023-09-01 DIAGNOSIS — Z789 Other specified health status: Secondary | ICD-10-CM

## 2023-09-01 DIAGNOSIS — E782 Mixed hyperlipidemia: Secondary | ICD-10-CM

## 2023-09-01 MED ORDER — PRALUENT 150 MG/ML ~~LOC~~ SOAJ: 150.0000 mg | SUBCUTANEOUS | 2 refills | Status: DC

## 2023-09-01 NOTE — Telephone Encounter (Signed)
*  STAT* If patient is at the pharmacy, call can be transferred to refill team.   1. Which medications need to be refilled? (please list name of each medication and dose if known) need a new prescription for Repatha    2. Would you like to learn more about the convenience, safety, & potential cost savings by using the [facility]?    3. Are you open to using the [facility] ([facility].   4. Which pharmacy/location (including street and city if local pharmacy) is medication to be sent to?[facility] [address] [place],   5. Do they need a 30 day or 90 day supply?

## 2023-09-04 ENCOUNTER — Telehealth: Payer: Self-pay

## 2023-09-04 ENCOUNTER — Other Ambulatory Visit (HOSPITAL_COMMUNITY): Payer: Self-pay

## 2023-09-04 MED ORDER — REPATHA SURECLICK 140 MG/ML ~~LOC~~ SOAJ: 140.0000 mg | SUBCUTANEOUS | 3 refills | Status: DC

## 2023-09-04 NOTE — Telephone Encounter (Signed)
 Insurance switched preferred medication to Repatha .  Patient aware.  Please do PA for Repatha  140 mg

## 2023-09-04 NOTE — Telephone Encounter (Signed)
 Pharmacy Patient Advocate Encounter   Received notification from Physician's Office that prior authorization for REPATHA  is required/requested.   Insurance verification completed.   The patient is insured through [facility] .   Per test claim: The current 28 day co-pay is, $24.99.  No PA needed at this time. This test claim was processed through [facility]- copay amounts may vary at other pharmacies due to pharmacy/plan contracts, or as the patient moves through the different stages of their insurance plan.

## 2023-09-04 NOTE — Addendum Note (Signed)
 Addended by: Lesha Jager L on: 09/04/2023 04:48 PM   Modules accepted: Orders

## 2023-09-04 NOTE — Telephone Encounter (Signed)
 Per test claim patient has two plans and appears as though the [employer] has to be ran as primary. Per test claim on [employer] Repatha  is covered. The evoucher paid $113.77 towards the copay. [facility]24.99 at [facility] for 1 month.

## 2023-09-04 NOTE — Telephone Encounter (Signed)
 FMLA forms have been received and given to Dr. Waylan Haggard

## 2023-09-05 NOTE — Telephone Encounter (Signed)
 Pt called in stating she spoke to [facility] [employer] last night and they told her it would be $138. She is willing to go to [facility] pharmacy in [place] if the cost will be cheaper, she said she is able to do $24.99. please advise

## 2023-09-05 NOTE — Telephone Encounter (Signed)
 Forms have been completed and placed up front in the FMLA box.

## 2023-09-05 NOTE — Telephone Encounter (Signed)
 Hi, can you send her prescription to our pharmacy [facility] [place]) instead? (Based on her note below)

## 2023-09-06 ENCOUNTER — Other Ambulatory Visit (HOSPITAL_COMMUNITY): Payer: Self-pay

## 2023-09-06 ENCOUNTER — Other Ambulatory Visit (HOSPITAL_BASED_OUTPATIENT_CLINIC_OR_DEPARTMENT_OTHER): Payer: Self-pay

## 2023-09-06 ENCOUNTER — Telehealth: Payer: Self-pay | Admitting: Pulmonary Disease

## 2023-09-06 ENCOUNTER — Other Ambulatory Visit: Payer: Self-pay | Admitting: Pharmacist Clinician (PhC)/ Clinical Pharmacy Specialist

## 2023-09-06 MED ORDER — REPATHA SURECLICK 140 MG/ML ~~LOC~~ SOAJ
140.0000 mg | SUBCUTANEOUS | 3 refills | Status: DC
  Filled 2023-09-06: qty 2, 28d supply, fill #0
  Filled 2023-09-06: qty 6, 84d supply, fill #0
  Filled 2023-09-27: qty 2, 28d supply, fill #1
  Filled 2023-10-27: qty 2, 28d supply, fill #2
  Filled 2023-11-24: qty 2, 28d supply, fill #3
  Filled 2023-12-29: qty 2, 28d supply, fill #4
  Filled 2024-01-23: qty 2, 28d supply, fill #5

## 2023-09-06 NOTE — Telephone Encounter (Signed)
 Rc'd signed FMLA forms from Dr. Waylan Haggard. Fax'd to [employer]. Rec'd confirmation, sent Pt copy. NFN

## 2023-09-06 NOTE — Telephone Encounter (Signed)
 Rx sent to [facility]

## 2023-09-12 ENCOUNTER — Ambulatory Visit (INDEPENDENT_AMBULATORY_CARE_PROVIDER_SITE_OTHER): Payer: BC Managed Care – PPO | Admitting: Obstetrics and Gynecology

## 2023-09-12 ENCOUNTER — Encounter: Payer: Self-pay | Admitting: Obstetrics and Gynecology

## 2023-09-12 VITALS — BP 100/62 | HR 89

## 2023-09-12 DIAGNOSIS — N3281 Overactive bladder: Secondary | ICD-10-CM | POA: Diagnosis not present

## 2023-09-12 DIAGNOSIS — N812 Incomplete uterovaginal prolapse: Secondary | ICD-10-CM | POA: Diagnosis not present

## 2023-09-12 DIAGNOSIS — N8110 Cystocele, unspecified: Secondary | ICD-10-CM

## 2023-09-12 DIAGNOSIS — R351 Nocturia: Secondary | ICD-10-CM

## 2023-09-12 MED ORDER — MIRABEGRON ER 25 MG PO TB24: 25.0000 mg | ORAL_TABLET | Freq: Every day | ORAL | 0 refills | Status: DC
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
## 2023-09-13 ENCOUNTER — Ambulatory Visit: Payer: BC Managed Care – PPO | Admitting: Obstetrics and Gynecology

## 2023-09-20 ENCOUNTER — Other Ambulatory Visit: Payer: Self-pay | Admitting: Pulmonary Disease

## 2023-09-21 ENCOUNTER — Other Ambulatory Visit: Payer: Self-pay

## 2023-09-21 ENCOUNTER — Emergency Department (HOSPITAL_COMMUNITY)

## 2023-09-21 ENCOUNTER — Encounter (HOSPITAL_COMMUNITY): Payer: Self-pay | Admitting: Internal Medicine

## 2023-09-21 ENCOUNTER — Observation Stay (HOSPITAL_COMMUNITY)
Admission: EM | Admit: 2023-09-21 | Discharge: 2023-09-23 | Disposition: A | Discharge status: Home / Self Care | Attending: Internal Medicine | Admitting: Internal Medicine

## 2023-09-21 DIAGNOSIS — I10 Essential (primary) hypertension: Secondary | ICD-10-CM | POA: Diagnosis present

## 2023-09-21 DIAGNOSIS — J9601 Acute respiratory failure with hypoxia: Secondary | ICD-10-CM | POA: Diagnosis not present

## 2023-09-21 DIAGNOSIS — K219 Gastro-esophageal reflux disease without esophagitis: Secondary | ICD-10-CM | POA: Insufficient documentation

## 2023-09-21 DIAGNOSIS — Z79899 Other long term (current) drug therapy: Secondary | ICD-10-CM | POA: Diagnosis not present

## 2023-09-21 DIAGNOSIS — E782 Mixed hyperlipidemia: Secondary | ICD-10-CM | POA: Diagnosis not present

## 2023-09-21 DIAGNOSIS — Z87891 Personal history of nicotine dependence: Secondary | ICD-10-CM | POA: Insufficient documentation

## 2023-09-21 DIAGNOSIS — R0602 Shortness of breath: Secondary | ICD-10-CM | POA: Diagnosis present

## 2023-09-21 DIAGNOSIS — I2510 Atherosclerotic heart disease of native coronary artery without angina pectoris: Secondary | ICD-10-CM | POA: Insufficient documentation

## 2023-09-21 DIAGNOSIS — J9621 Acute and chronic respiratory failure with hypoxia: Secondary | ICD-10-CM | POA: Diagnosis present

## 2023-09-21 DIAGNOSIS — J9622 Acute and chronic respiratory failure with hypercapnia: Secondary | ICD-10-CM | POA: Insufficient documentation

## 2023-09-21 DIAGNOSIS — Z1152 Encounter for screening for COVID-19: Secondary | ICD-10-CM | POA: Diagnosis not present

## 2023-09-21 DIAGNOSIS — K449 Diaphragmatic hernia without obstruction or gangrene: Secondary | ICD-10-CM | POA: Diagnosis not present

## 2023-09-21 DIAGNOSIS — Z8616 Personal history of COVID-19: Secondary | ICD-10-CM | POA: Diagnosis not present

## 2023-09-21 DIAGNOSIS — J441 Chronic obstructive pulmonary disease with (acute) exacerbation: Principal | ICD-10-CM | POA: Diagnosis present

## 2023-09-21 DIAGNOSIS — J449 Chronic obstructive pulmonary disease, unspecified: Secondary | ICD-10-CM

## 2023-09-21 DIAGNOSIS — J9602 Acute respiratory failure with hypercapnia: Secondary | ICD-10-CM | POA: Diagnosis not present

## 2023-09-21 LAB — CBC WITH DIFFERENTIAL/PLATELET
Abs Immature Granulocytes: 0.04 10*3/uL (ref 0.00–0.07)
Basophils Absolute: 0 10*3/uL (ref 0.0–0.1)
Basophils Relative: 0 %
Eosinophils Absolute: 0 10*3/uL (ref 0.0–0.5)
Eosinophils Relative: 0 %
HCT: 39.1 % (ref 36.0–46.0)
Hemoglobin: 11.9 g/dL — ABNORMAL LOW (ref 12.0–15.0)
Immature Granulocytes: 0 %
Lymphocytes Relative: 8 %
Lymphs Abs: 1 10*3/uL (ref 0.7–4.0)
MCH: 28.2 pg (ref 26.0–34.0)
MCHC: 30.4 g/dL (ref 30.0–36.0)
MCV: 92.7 fL (ref 80.0–100.0)
Monocytes Absolute: 0.5 10*3/uL (ref 0.1–1.0)
Monocytes Relative: 4 %
Neutro Abs: 10.6 10*3/uL — ABNORMAL HIGH (ref 1.7–7.7)
Neutrophils Relative %: 88 %
Platelets: 313 10*3/uL (ref 150–400)
RBC: 4.22 MIL/uL (ref 3.87–5.11)
RDW: 14.6 % (ref 11.5–15.5)
WBC: 12.2 10*3/uL — ABNORMAL HIGH (ref 4.0–10.5)
nRBC: 0 % (ref 0.0–0.2)

## 2023-09-21 LAB — BASIC METABOLIC PANEL WITH GFR
Anion gap: 12 (ref 5–15)
BUN: 17 mg/dL (ref 8–23)
CO2: 27 mmol/L (ref 22–32)
Calcium: 9.1 mg/dL (ref 8.9–10.3)
Chloride: 100 mmol/L (ref 98–111)
Creatinine, Ser: 0.58 mg/dL (ref 0.44–1.00)
GFR, Estimated: >60 mL/min (ref >60)
Glucose, Bld: 123 mg/dL — ABNORMAL HIGH (ref 70–99)
Potassium: 3.7 mmol/L (ref 3.5–5.1)
Sodium: 139 mmol/L (ref 135–145)

## 2023-09-21 LAB — BLOOD GAS, VENOUS
Acid-Base Excess: 6.1 mmol/L — ABNORMAL HIGH (ref 0.0–2.0)
Bicarbonate: 34.5 mmol/L — ABNORMAL HIGH (ref 20.0–28.0)
O2 Saturation: 63.8 %
Patient temperature: 37
pCO2, Ven: 67 mmHg — ABNORMAL HIGH (ref 44–60)
pH, Ven: 7.32 (ref 7.25–7.43)
pO2, Ven: 39 mmHg (ref 32–45)

## 2023-09-21 LAB — PROCALCITONIN: Procalcitonin: <0.1 ng/mL

## 2023-09-21 LAB — RESP PANEL BY RT-PCR (RSV, FLU A&B, COVID)  RVPGX2
Influenza A by PCR: NEGATIVE
Influenza B by PCR: NEGATIVE
Resp Syncytial Virus by PCR: NEGATIVE
SARS Coronavirus 2 by RT PCR: NEGATIVE

## 2023-09-21 LAB — TROPONIN I (HIGH SENSITIVITY): Troponin I (High Sensitivity): 5 ng/L (ref <18)

## 2023-09-21 LAB — BRAIN NATRIURETIC PEPTIDE: B Natriuretic Peptide: 46.6 pg/mL (ref 0.0–100.0)

## 2023-09-21 MED ORDER — DOXYCYCLINE HYCLATE 100 MG PO TABS: 100.0000 mg | ORAL_TABLET | Freq: Two times a day (BID) | ORAL | Status: DC

## 2023-09-21 MED ORDER — HYDRALAZINE HCL 20 MG/ML IJ SOLN: 10.0000 mg | Freq: Four times a day (QID) | INTRAMUSCULAR | Status: DC | PRN

## 2023-09-21 MED ORDER — IBUPROFEN 200 MG PO TABS: 400.0000 mg | ORAL_TABLET | Freq: Two times a day (BID) | ORAL | Status: DC | PRN

## 2023-09-21 MED ORDER — MAGNESIUM SULFATE 2 GM/50ML IV SOLN: 2.0000 g | Freq: Once | INTRAVENOUS | Status: DC

## 2023-09-21 MED ORDER — METHYLPREDNISOLONE SODIUM SUCC 125 MG IJ SOLR: 125.0000 mg | Freq: Once | INTRAMUSCULAR | Status: DC

## 2023-09-21 MED ORDER — IPRATROPIUM-ALBUTEROL 0.5-2.5 (3) MG/3ML IN SOLN: 3.0000 mL | RESPIRATORY_TRACT | Status: DC

## 2023-09-21 MED ORDER — DOXYCYCLINE HYCLATE 100 MG PO TABS
100.0000 mg | ORAL_TABLET | Freq: Two times a day (BID) | ORAL | Status: DC
  Administered 2023-09-22 – 2023-09-23 (×3): 100 mg via ORAL
  Filled 2023-09-21 (×3): qty 1

## 2023-09-21 MED ORDER — GUAIFENESIN ER 600 MG PO TB12
600.0000 mg | ORAL_TABLET | Freq: Two times a day (BID) | ORAL | Status: DC
  Administered 2023-09-21 – 2023-09-23 (×4): 600 mg via ORAL
  Filled 2023-09-21 (×4): qty 1

## 2023-09-21 MED ORDER — POLYETHYLENE GLYCOL 3350 17 G PO PACK: 17.0000 g | PACK | Freq: Every day | ORAL | Status: DC | PRN

## 2023-09-21 MED ORDER — OXYCODONE HCL 5 MG PO TABS: 5.0000 mg | ORAL_TABLET | ORAL | Status: DC | PRN

## 2023-09-21 MED ORDER — ENOXAPARIN SODIUM 40 MG/0.4ML IJ SOSY
40.0000 mg | PREFILLED_SYRINGE | Freq: Every day | INTRAMUSCULAR | Status: DC
  Administered 2023-09-21 – 2023-09-22 (×2): 40 mg via SUBCUTANEOUS
  Filled 2023-09-21 (×2): qty 0.4

## 2023-09-21 MED ORDER — METHYLPREDNISOLONE SODIUM SUCC 40 MG IJ SOLR
40.0000 mg | Freq: Every day | INTRAMUSCULAR | Status: DC
  Administered 2023-09-22: 40 mg via INTRAVENOUS
  Filled 2023-09-21: qty 1

## 2023-09-21 MED ORDER — SODIUM CHLORIDE 0.9 % IV SOLN
100.0000 mg | Freq: Once | INTRAVENOUS | Status: AC
  Administered 2023-09-21: 100 mg via INTRAVENOUS
  Filled 2023-09-21: qty 100

## 2023-09-21 MED ORDER — PANTOPRAZOLE SODIUM 40 MG PO TBEC
40.0000 mg | DELAYED_RELEASE_TABLET | Freq: Every day | ORAL | Status: DC
  Administered 2023-09-21 – 2023-09-23 (×3): 40 mg via ORAL
  Filled 2023-09-21 (×3): qty 1

## 2023-09-21 MED ORDER — ALBUTEROL SULFATE (2.5 MG/3ML) 0.083% IN NEBU
2.5000 mg | INHALATION_SOLUTION | Freq: Four times a day (QID) | RESPIRATORY_TRACT | Status: DC | PRN
  Administered 2023-09-21 – 2023-09-22 (×3): 2.5 mg via RESPIRATORY_TRACT
  Filled 2023-09-21 (×3): qty 3

## 2023-09-21 MED ORDER — BISACODYL 5 MG PO TBEC: 5.0000 mg | DELAYED_RELEASE_TABLET | Freq: Every day | ORAL | Status: DC | PRN

## 2023-09-21 MED ORDER — MAGNESIUM SULFATE 2 GM/50ML IV SOLN
2.0000 g | Freq: Once | INTRAVENOUS | Status: AC
  Administered 2023-09-21: 2 g via INTRAVENOUS
  Filled 2023-09-21: qty 50

## 2023-09-21 MED ORDER — ALBUTEROL SULFATE (2.5 MG/3ML) 0.083% IN NEBU
10.0000 mg | INHALATION_SOLUTION | RESPIRATORY_TRACT | Status: DC
  Administered 2023-09-21: 10 mg via RESPIRATORY_TRACT
  Filled 2023-09-21: qty 12

## 2023-09-21 MED ORDER — ENSIFENTRINE 3 MG/2.5ML IN SUSP: 3.0000 mg | Freq: Every day | RESPIRATORY_TRACT | Status: DC

## 2023-09-21 NOTE — Progress Notes (Signed)
 Assisted with transporting PT from [facility] ED to [facility] 1402- uneventful. RN is at bedside, PT is on 5 LPM nasal cannula tolerating well at this time. PT states she is breathing "fine." BiPAP remains in room and order is now for PRN. Discussed with PT signs and symptoms for BiPAP usage.

## 2023-09-21 NOTE — ED Notes (Signed)
 Patient asked me to cancel her doctor appointment with [facility], that was at 4:30pm.  Cancelled as she requested.

## 2023-09-21 NOTE — ED Triage Notes (Signed)
 Pt bib [facility] from urgent care, given albuterol  x 3, atrovent , IM epi 0.3, and 125mg  solumedrol. Tightness at first then wheezing after a couple of treatments per ems.  80% initial sats with ems
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
## 2023-09-21 NOTE — Progress Notes (Signed)
   09/21/23 2157 09/21/23 2159 09/21/23 2202  Vitals  Pulse Rate (!) 113 (!) 129 (!) 106  Pulse Rate Source Monitor Monitor Monitor  Resp (!) 28 (!) 28 (!) 24  MEWS COLOR  MEWS Score Color Red Red Yellow  Oxygen  Therapy  SpO2 100 % (Pt is ambulating in hallway.) 91 % (Pt is ambulating in hallway) 99 % (Pt is resting in room after ambulating in hallway)  O2 Device Nasal Cannula Room Air Nasal Cannula  O2 Flow Rate (L/min) 6 L/min  --  5 L/min   Capone Schwinn, RN

## 2023-09-21 NOTE — ED Notes (Signed)
 Called RT to take up patient.

## 2023-09-21 NOTE — Telephone Encounter (Signed)
 Dr. Isaiah Serge, please advise if okay to order.
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
## 2023-09-21 NOTE — Progress Notes (Signed)
 Discussed with RN that PT door should remain open while on BiPAP.

## 2023-09-22 DIAGNOSIS — J441 Chronic obstructive pulmonary disease with (acute) exacerbation: Secondary | ICD-10-CM | POA: Diagnosis not present

## 2023-09-22 LAB — BASIC METABOLIC PANEL WITH GFR
Anion gap: 6 (ref 5–15)
BUN: 15 mg/dL (ref 8–23)
CO2: 31 mmol/L (ref 22–32)
Calcium: 8.6 mg/dL — ABNORMAL LOW (ref 8.9–10.3)
Chloride: 100 mmol/L (ref 98–111)
Creatinine, Ser: 0.56 mg/dL (ref 0.44–1.00)
GFR, Estimated: >60 mL/min (ref >60)
Glucose, Bld: 92 mg/dL (ref 70–99)
Potassium: 4.3 mmol/L (ref 3.5–5.1)
Sodium: 137 mmol/L (ref 135–145)

## 2023-09-22 LAB — GLUCOSE, CAPILLARY: Glucose-Capillary: 118 mg/dL — ABNORMAL HIGH (ref 70–99)

## 2023-09-22 LAB — CBC
HCT: 36.8 % (ref 36.0–46.0)
Hemoglobin: 11.2 g/dL — ABNORMAL LOW (ref 12.0–15.0)
MCH: 28.1 pg (ref 26.0–34.0)
MCHC: 30.4 g/dL (ref 30.0–36.0)
MCV: 92.2 fL (ref 80.0–100.0)
Platelets: 274 10*3/uL (ref 150–400)
RBC: 3.99 MIL/uL (ref 3.87–5.11)
RDW: 14.6 % (ref 11.5–15.5)
WBC: 10.5 10*3/uL (ref 4.0–10.5)
nRBC: 0 % (ref 0.0–0.2)

## 2023-09-22 MED ORDER — IPRATROPIUM-ALBUTEROL 0.5-2.5 (3) MG/3ML IN SOLN
3.0000 mL | RESPIRATORY_TRACT | Status: DC
  Administered 2023-09-22 – 2023-09-23 (×7): 3 mL via RESPIRATORY_TRACT
  Filled 2023-09-22 (×7): qty 3

## 2023-09-22 MED ORDER — METHYLPREDNISOLONE SODIUM SUCC 125 MG IJ SOLR
125.0000 mg | Freq: Every day | INTRAMUSCULAR | Status: DC
  Administered 2023-09-22 – 2023-09-23 (×2): 125 mg via INTRAVENOUS
  Filled 2023-09-22 (×2): qty 2

## 2023-09-22 MED ORDER — ALBUTEROL SULFATE (2.5 MG/3ML) 0.083% IN NEBU: 2.5000 mg | INHALATION_SOLUTION | RESPIRATORY_TRACT | Status: DC | PRN

## 2023-09-22 NOTE — Progress Notes (Signed)
   09/22/23 2225  BiPAP/CPAP/SIPAP  Reason BIPAP/CPAP not in use Other(comment) (BIPAP IS PRN. PATIENT IS DOING WELL AT THIS TIME AND DOES NOT WISH TO WEAR.)

## 2023-09-22 NOTE — Hospital Course (Signed)
 63yo with h/o preDM, HTN, HLD, OSA, CAD, and COPD on home O2 who presented on 5/8 with SOB.  She wears up to 5L home O2 but does not wear her O2 at work.  O2 80%.  Given doxy + steroids.

## 2023-09-22 NOTE — Progress Notes (Signed)
   09/21/23 1905  Mobility  HOB Elevated/Bed Position Self regulated  Activity Ambulated with assistance in hallway;Ambulated with assistance in room;Ambulated with assistance to bathroom. Monitoring SPO2 to compare Pt walked with and without O2 supplement.   Range of Motion/Exercises Active;All extremities  Level of Assistance Standby assist, set-up cues, supervision of patient - no hands on  Assistive Device None,   Distance Ambulated (ft) 500 ft  Activity Response Tolerated well  Transport method Ambulatory   Darrel Elm, RN

## 2023-09-22 NOTE — Progress Notes (Signed)
 Progress Note   Patient: [name] [id number] DOB: 24-Mar-1961 DOA: 09/21/2023     0 DOS: the patient was seen and examined on 09/22/2023   Brief hospital course: 63yo with h/o preDM, HTN, HLD, OSA, CAD, and COPD on home O2 who presented on 5/8 with SOB.  She wears up to 5L home O2 but does not wear her O2 at work.  O2 80%.  Given doxy + steroids.  Assessment and Plan:   Acute on chronic respiratory failure associated with a COPD exacerbation Patient's shortness of breath and productive cough are most likely caused by acute COPD exacerbation Required BIPAP on admission, now back on home O2  She has a h/o O2-dependent COPD but wears 5L at home and yet does not wear O2 at work (40 hours/week) Discussed with Dr. Diania Fortes, who agrees that this is discordant Completed ambulatory pulse ox to determine how much oxygen  patient actually needs, turned down to 2L Patient will need to consider changing jobs or retiring/seeking disability given her need for O2 She does not have fever or leukocytosis Chest x-ray is not consistent with pneumonia Will observe for now and attempt to wean [place] O2 if possible Nebulizers: scheduled Duoneb and prn albuterol  Solu-Medrol  60 mg IV BID -> Prednisone  40 mg PO daily; 5 days of treatment is thought to be sufficient for treatment of acute COPD exacerbations PO Doxycycline  Appears to have last been seen by pulm in 04/2023 Reported to be on Breztri  but this is not on her medication list Was also reportedly on Trelegy, Daliresp  in the past - also not listed Resume Breztri  at time of dc Will need close outpatient f/u   H/o cardiomyopathy, HTN Not on antihypertensives at home   H/o CAD, HLD On Repatha  every 2 weeks   hiatal hernia, GERD Continue PPI         Consultants: None   Procedures: None   Antibiotics: Doxycycline  5/8-     Subjective: Continues to feel SOB. No significant cough currently.   Objective: Vitals:   09/22/23 1408 09/22/23  1544  BP: 111/67   Pulse: 100   Resp: (!) 21   Temp: 98.3 F (36.8 C)   SpO2: 100% 94%    Intake/Output Summary (Last 24 hours) at 09/22/2023 1715 Last data filed at 09/22/2023 0900 Gross per 24 hour  Intake 1090 ml  Output --  Net 1090 ml   Filed Weights   09/21/23 1917  Weight: 67.6 kg    Exam:  General:  Appears calm and comfortable and is in NAD, on 5L [place] O2 Eyes:  EOMI, normal lids, iris ENT:  grossly normal hearing, lips & tongue, mmm Cardiovascular:  RRR. No LE edema.  Respiratory:   Scattered expiratory wheezing. Mildly increased respiratory effort. Abdomen:  soft, NT, ND Skin:  no rash or induration seen on limited exam Musculoskeletal:  grossly normal tone BUE/BLE, good ROM, no bony abnormality Psychiatric:  grossly normal mood and affect, speech fluent and appropriate, AOx3 Neurologic:  CN 2-12 grossly intact, moves all extremities in coordinated fashion  Data Reviewed: I have reviewed the patient's lab results since admission.  Pertinent labs for today include:   Unremarkable BMP WBC 10.5 Hgb 11.2 COVID/flu/RSV negative     Family Communication: None present  Disposition: Status is: Observation The patient remains OBS appropriate and will d/c before 2 midnights.     Time spent: 50 minutes  Unresulted Labs (From admission, onward)     Start     Ordered  Unscheduled  CBC with Differential/Platelet  Tomorrow morning,   R       Question:  Specimen collection method  Answer:  Lab=Lab collect   09/22/23 1715   Unscheduled  Basic metabolic panel  Tomorrow morning,   R       Question:  Specimen collection method  Answer:  Lab=Lab collect   09/22/23 1715             Author: Lorita Rosa, MD 09/22/2023 5:15 PM  For on call review [email or web address].

## 2023-09-22 NOTE — Plan of Care (Signed)
  Problem: Activity: Goal: Risk for activity intolerance will decrease Outcome: Progressing   Problem: Elimination: Goal: Will not experience complications related to bowel motility Outcome: Progressing Goal: Will not experience complications related to urinary retention Outcome: Progressing   Problem: Pain Managment: Goal: General experience of comfort will improve and/or be controlled Outcome: Progressing   Problem: Safety: Goal: Ability to remain free from injury will improve Outcome: Progressing

## 2023-09-22 NOTE — Progress Notes (Signed)
 Spoke with Pharmacy concerning PT ordered medication,  Ensifentrine . Pharmacist states she will reach out to the patient to determine if she is currently taking this medication outside of the hospital.

## 2023-09-23 ENCOUNTER — Other Ambulatory Visit: Payer: Self-pay | Admitting: Pulmonary Disease

## 2023-09-23 DIAGNOSIS — J441 Chronic obstructive pulmonary disease with (acute) exacerbation: Secondary | ICD-10-CM | POA: Diagnosis not present

## 2023-09-23 LAB — CBC WITH DIFFERENTIAL/PLATELET
Abs Immature Granulocytes: 0.04 10*3/uL (ref 0.00–0.07)
Basophils Absolute: 0 10*3/uL (ref 0.0–0.1)
Basophils Relative: 0 %
Eosinophils Absolute: 0 10*3/uL (ref 0.0–0.5)
Eosinophils Relative: 0 %
HCT: 37.4 % (ref 36.0–46.0)
Hemoglobin: 11.6 g/dL — ABNORMAL LOW (ref 12.0–15.0)
Immature Granulocytes: 0 %
Lymphocytes Relative: 9 %
Lymphs Abs: 0.9 10*3/uL (ref 0.7–4.0)
MCH: 28.6 pg (ref 26.0–34.0)
MCHC: 31 g/dL (ref 30.0–36.0)
MCV: 92.1 fL (ref 80.0–100.0)
Monocytes Absolute: 0.6 10*3/uL (ref 0.1–1.0)
Monocytes Relative: 7 %
Neutro Abs: 7.9 10*3/uL — ABNORMAL HIGH (ref 1.7–7.7)
Neutrophils Relative %: 84 %
Platelets: 234 10*3/uL (ref 150–400)
RBC: 4.06 MIL/uL (ref 3.87–5.11)
RDW: 14.4 % (ref 11.5–15.5)
WBC: 9.5 10*3/uL (ref 4.0–10.5)
nRBC: 0 % (ref 0.0–0.2)

## 2023-09-23 LAB — BASIC METABOLIC PANEL WITH GFR
Anion gap: 9 (ref 5–15)
BUN: 14 mg/dL (ref 8–23)
CO2: 30 mmol/L (ref 22–32)
Calcium: 8.7 mg/dL — ABNORMAL LOW (ref 8.9–10.3)
Chloride: 101 mmol/L (ref 98–111)
Creatinine, Ser: 0.49 mg/dL (ref 0.44–1.00)
GFR, Estimated: >60 mL/min (ref >60)
Glucose, Bld: 81 mg/dL (ref 70–99)
Potassium: 3.8 mmol/L (ref 3.5–5.1)
Sodium: 140 mmol/L (ref 135–145)

## 2023-09-23 MED ORDER — IPRATROPIUM-ALBUTEROL 0.5-2.5 (3) MG/3ML IN SOLN: 3.0000 mL | Freq: Four times a day (QID) | RESPIRATORY_TRACT | 3 refills | Status: DC

## 2023-09-23 MED ORDER — BREZTRI AEROSPHERE 160-9-4.8 MCG/ACT IN AERO: 2.0000 | INHALATION_SPRAY | Freq: Two times a day (BID) | RESPIRATORY_TRACT | 0 refills | Status: DC

## 2023-09-23 MED ORDER — PREDNISONE 10 MG PO TABS
ORAL_TABLET | ORAL | 0 refills | Status: DC
Start: 2023-09-23 — End: 2023-10-01

## 2023-09-23 MED ORDER — GUAIFENESIN ER 600 MG PO TB12: 600.0000 mg | ORAL_TABLET | Freq: Two times a day (BID) | ORAL | 0 refills | Status: DC | PRN

## 2023-09-23 MED ORDER — DOXYCYCLINE HYCLATE 100 MG PO TABS
100.0000 mg | ORAL_TABLET | Freq: Two times a day (BID) | ORAL | 0 refills | Status: AC
Start: 2023-09-23 — End: 2023-09-28

## 2023-09-23 NOTE — TOC Transition Note (Signed)
 Transition of Care [facility]) - Discharge Note   Patient Details  Name: [name] MRN: [id number] Date of Birth: Dec 02, 1960  [facility] CM/SW Contact:  Levie Ream, RN Phone Number: 09/23/2023, 1:29 PM   Clinical Narrative:    [facility] consult for home oxygen ; notified by Sandy Crumb, RN pt's relative bought travel tank, and she has d/c'd home; no orders or ambulatory sat note received; no TOC needs.   Final next level of care: Home/Self Care Barriers to Discharge: No Barriers Identified   Patient Goals and CMS Choice            Discharge Placement                       Discharge Plan and Services Additional resources added to the After Visit Summary for                  DME Arranged: N/A DME Agency: NA                  Social Drivers of Health (SDOH) Interventions SDOH Screenings   Food Insecurity: No Food Insecurity (09/21/2023)  Housing: Low Risk  (09/21/2023)  Transportation Needs: No Transportation Needs (09/21/2023)  Utilities: Not At Risk (09/21/2023)  Financial Resource Strain: Low Risk  (04/10/2019)  Physical Activity: Inactive (04/10/2019)  Social Connections: Moderately Integrated (09/21/2023)  Stress: No Stress Concern Present (04/10/2019)  Tobacco Use: Medium Risk (09/21/2023)     Readmission Risk Interventions    12/13/2022   11:05 AM 05/23/2022   10:12 AM  Readmission Risk Prevention Plan  Transportation Screening Complete Complete  PCP or Specialist Appt within 5-7 Days Complete   PCP or Specialist Appt within 3-5 Days  Complete  Home Care Screening Complete   Medication Review (RN CM) Complete   HRI or Home Care Consult  Complete  Social Work Consult for Recovery Care Planning/Counseling  Complete  Palliative Care Screening  Not Applicable  Medication Review Oceanographer)  Complete

## 2023-09-23 NOTE — Discharge Summary (Signed)
 Physician Discharge Summary   Patient: [name] MRN: [id number] DOB: 30-May-1960  Admit date:     09/21/2023  Discharge date: 09/23/23  Discharge Physician: Lorita Rosa   PCP: Tretha Fu, MD   Recommendations at discharge:   Complete antibiotics (Doxycycline  twice daily for 7 total days) Continue prednisone  taper as ordered Consider whether it is appropriate to continue working given the fact that you cannot wear oxygen  at your workplace Resume Breztri  Follow up with pulmonology, call for appointment Follow up with Dr. Sherlene Diss in 1-2 weeks, call for appointment You are being referred for lung cancer screening and should be contacted about this  Discharge Diagnoses: Principal Problem:   COPD with acute exacerbation [facility]) Active Problems:   Hiatal hernia   Essential hypertension   Mixed dyslipidemia   Acute on chronic respiratory failure with hypoxia and hypercapnia [facility])   Hospital Course: 63yo with h/o preDM, HTN, HLD, OSA, CAD, and COPD on home O2 who presented on 5/8 with SOB.  She wears up to 5L home O2 but does not wear her O2 at work.  O2 80%.  Given doxy + steroids.  Assessment and Plan:  Acute on chronic respiratory failure associated with a COPD exacerbation Patient's shortness of breath and productive cough are most likely caused by acute COPD exacerbation Required BIPAP on admission, now back on home O2  She has a h/o O2-dependent COPD but wears 5L at home and yet does not wear O2 at work (40 hours/week) Discussed with Dr. Diania Fortes, who agrees that this is discordant Completed ambulatory pulse ox to determine how much oxygen  patient actually needs, turned down to 2L without difficulty Patient will need to consider changing jobs or retiring/seeking disability given her need for O2 Nebulizers: scheduled Duoneb and prn albuterol  Solu-Medrol  60 mg IV BID -> Prednisone  40 mg PO daily with prednisone  taper PO Doxycycline  Appears to have last been seen  by pulm in 04/2023, needs outpatient f/u Reported to be on Breztri  but this is not on her medication list Was also reportedly on Trelegy, Daliresp  in the past - also not listed Resume Breztri  at time of dc Will need close outpatient f/u   H/o cardiomyopathy, HTN Not on antihypertensives at home   H/o CAD, HLD On Repatha  every 2 weeks   hiatal hernia, GERD Continue PPI         Consultants: None   Procedures: None   Antibiotics: Doxycycline  5/8-    Pain control - [place]  Controlled Substance Reporting System database was reviewed. and patient was instructed, not to drive, operate heavy machinery, perform activities at heights, swimming or participation in water  activities or provide baby-sitting services while on Pain, Sleep and Anxiety Medications; until their outpatient Physician has advised to do so again. Also recommended to not to take more than prescribed Pain, Sleep and Anxiety Medications.    Disposition: Home Diet recommendation:  Regular diet DISCHARGE MEDICATION: Allergies as of 09/23/2023       Reactions   Amoxicillin  Shortness Of Breath   Statins Other (See Comments)   Worsened the patient's COPD   Tylenol  [acetaminophen ] Shortness Of Breath   Zetia  [ezetimibe ] Other (See Comments)   Worsened the patient's COPD        Medication List     PAUSE taking these medications    predniSONE  10 MG tablet Wait to take this until your doctor or other care provider tells you to start again. Commonly known as: DELTASONE  Take 10 mg by mouth daily. You also  have another medication with the same name that you may need to continue taking.       STOP taking these medications    mirabegron  ER 25 MG Tb24 tablet Commonly known as: Myrbetriq        TAKE these medications    albuterol  (2.5 MG/3ML) 0.083% nebulizer solution Commonly known as: PROVENTIL  Take 3 mLs (2.5 mg total) by nebulization every 6 (six) hours as needed for wheezing or shortness of  breath. What changed: Another medication with the same name was removed. Continue taking this medication, and follow the directions you see here.   Breztri  Aerosphere 160-9-4.8 MCG/ACT Aero inhaler Generic drug: budeson-glycopyrrolate -formoterol  Inhale 2 puffs into the lungs in the morning and at bedtime.   doxycycline  100 MG tablet Commonly known as: VIBRA -TABS Take 1 tablet (100 mg total) by mouth every 12 (twelve) hours for 5 days.   guaiFENesin  600 MG 12 hr tablet Commonly known as: MUCINEX  Take 1 tablet (600 mg total) by mouth 2 (two) times daily as needed for cough.   ipratropium-albuterol  0.5-2.5 (3) MG/3ML Soln Commonly known as: DUONEB Take 3 mLs by nebulization every 6 (six) hours. What changed:  when to take this reasons to take this   nitroGLYCERIN  0.4 MG SL tablet Commonly known as: NITROSTAT  Place 1 tablet (0.4 mg total) under the tongue every 5 (five) minutes as needed.   omeprazole  20 MG capsule Commonly known as: PRILOSEC TAKE 1 CAPSULE BY MOUTH EVERY DAY   OXYGEN  Inhale 4 L/min into the lungs See admin instructions. 5 L/min at bedtime and as needed for shortness of breath throughout the daytime   predniSONE  10 MG tablet Commonly known as: DELTASONE  Take 4 tablets (40 mg total) by mouth daily for 5 days, THEN 3 tablets (30 mg total) daily for 5 days, THEN 2 tablets (20 mg total) daily for 5 days, THEN 1 tablet (10 mg total) daily for 5 days. Start taking on: Sep 23, 2023 What changed: Another medication with the same name was paused. Ask your nurse or doctor if you should take this medication.   Repatha  SureClick 140 MG/ML Soaj Generic drug: Evolocumab  Inject 140 mg into the skin every 14 (fourteen) days.        Discharge Exam:   Subjective: Feeling better,  Ambulating in the room without difficulty on [place] O2.  Feels ready for dc.   Objective: Vitals:   09/23/23 0604 09/23/23 1121  BP:    Pulse:    Resp:    Temp:    SpO2: 96% 96%     Intake/Output Summary (Last 24 hours) at 09/23/2023 1200 Last data filed at 09/23/2023 0900 Gross per 24 hour  Intake 720 ml  Output --  Net 720 ml   Filed Weights   09/21/23 1917  Weight: 67.6 kg    Exam:  General:  Appears calm and comfortable and is in NAD, on 2L [place] O2 Eyes:  EOMI, normal lids, iris ENT:  grossly normal hearing, lips & tongue, mmm Cardiovascular:  RRR. No LE edema.  Respiratory:   Scattered expiratory wheezing. Mildly increased respiratory effort. Abdomen:  soft, NT, ND Skin:  no rash or induration seen on limited exam Musculoskeletal:  grossly normal tone BUE/BLE, good ROM, no bony abnormality Psychiatric:  grossly normal mood and affect, speech fluent and appropriate, AOx3 Neurologic:  CN 2-12 grossly intact, moves all extremities in coordinated fashion  Data Reviewed: I have reviewed the patient's lab results since admission.  Pertinent labs for today include:  None today    Condition at discharge: improving  The results of significant diagnostics from this hospitalization (including imaging, microbiology, ancillary and laboratory) are listed below for reference.   Imaging Studies: DG Chest Port 1 View Result Date: 09/21/2023 CLINICAL DATA:  Shortness of breath. EXAM: PORTABLE CHEST 1 VIEW COMPARISON:  05/22/2023. FINDINGS: Stable mild cardiomegaly. Chronic emphysematous changes. Mild bibasilar linear scarring. No focal consolidation, sizeable pleural effusion, or pneumothorax. No acute osseous abnormality. IMPRESSION: 1. No acute cardiopulmonary findings. 2. Chronic emphysematous changes. Electronically Signed   By: Mannie Seek M.D.   On: 09/21/2023 13:23    Microbiology: Results for orders placed or performed during the hospital encounter of 09/21/23  Resp panel by RT-PCR (RSV, Flu A&B, Covid) Anterior Nasal Swab     Status: None   Collection Time: 09/21/23 12:55 PM   Specimen: Anterior Nasal Swab  Result Value Ref Range Status   SARS  Coronavirus 2 by RT PCR NEGATIVE NEGATIVE Final    Comment: (NOTE) SARS-CoV-2 target nucleic acids are NOT DETECTED.  The SARS-CoV-2 RNA is generally detectable in upper respiratory specimens during the acute phase of infection. The lowest concentration of SARS-CoV-2 viral copies this assay can detect is 138 copies/mL. A negative result does not preclude SARS-Cov-2 infection and should not be used as the sole basis for treatment or other patient management decisions. A negative result may occur with  improper specimen collection/handling, submission of specimen other than nasopharyngeal swab, presence of viral mutation(s) within the areas targeted by this assay, and inadequate number of viral copies(<138 copies/mL). A negative result must be combined with clinical observations, patient history, and epidemiological information. The expected result is Negative.  Fact Sheet for Patients:  BloggerCourse.com  Fact Sheet for Healthcare Providers:  SeriousBroker.it  This test is no t yet approved or cleared by the [place]  FDA and  has been authorized for detection and/or diagnosis of SARS-CoV-2 by FDA under an Emergency Use Authorization (EUA). This EUA will remain  in effect (meaning this test can be used) for the duration of the COVID-19 declaration under Section 564(b)(1) of the Act, 21 U.S.C.section 360bbb-3(b)(1), unless the authorization is terminated  or revoked sooner.       Influenza A by PCR NEGATIVE NEGATIVE Final   Influenza B by PCR NEGATIVE NEGATIVE Final    Comment: (NOTE) The Xpert Xpress SARS-CoV-2/FLU/RSV plus assay is intended as an aid in the diagnosis of influenza from Nasopharyngeal swab specimens and should not be used as a sole basis for treatment. Nasal washings and aspirates are unacceptable for Xpert Xpress SARS-CoV-2/FLU/RSV testing.  Fact Sheet for  Patients: BloggerCourse.com  Fact Sheet for Healthcare Providers: SeriousBroker.it  This test is not yet approved or cleared by the [place]  FDA and has been authorized for detection and/or diagnosis of SARS-CoV-2 by FDA under an Emergency Use Authorization (EUA). This EUA will remain in effect (meaning this test can be used) for the duration of the COVID-19 declaration under Section 564(b)(1) of the Act, 21 U.S.C. section 360bbb-3(b)(1), unless the authorization is terminated or revoked.     Resp Syncytial Virus by PCR NEGATIVE NEGATIVE Final    Comment: (NOTE) Fact Sheet for Patients: BloggerCourse.com  Fact Sheet for Healthcare Providers: SeriousBroker.it  This test is not yet approved or cleared by the [place]  FDA and has been authorized for detection and/or diagnosis of SARS-CoV-2 by FDA under an Emergency Use Authorization (EUA). This EUA will remain in effect (meaning this test can be used) for  the duration of the COVID-19 declaration under Section 564(b)(1) of the Act, 21 U.S.C. section 360bbb-3(b)(1), unless the authorization is terminated or revoked.  Performed at [facility], [address] [place], [place] [address]     Labs: CBC: Recent Labs  Lab 09/21/23 1305 09/22/23 0509 09/23/23 0725  WBC 12.2* 10.5 9.5  NEUTROABS 10.6*  --  7.9*  HGB 11.9* 11.2* 11.6*  HCT 39.1 36.8 37.4  MCV 92.7 92.2 92.1  PLT 313 274 234   Basic Metabolic Panel: Recent Labs  Lab 09/21/23 1305 09/22/23 0509 09/23/23 0725  NA 139 137 140  K 3.7 4.3 3.8  CL 100 100 101  CO2 27 31 30   GLUCOSE 123* 92 81  BUN 17 15 14   CREATININE 0.58 0.56 0.49  CALCIUM 9.1 8.6* 8.7*   Liver Function Tests: No results for input(s): "AST", "ALT", "ALKPHOS", "BILITOT", "PROT", "ALBUMIN" in the last 168 hours. CBG: Recent Labs  Lab 09/22/23 0818  GLUCAP  118*    Discharge time spent: greater than 30 minutes.  Signed: Lorita Rosa, MD [facility] 09/23/2023

## 2023-09-25 NOTE — Telephone Encounter (Signed)
 Dr. Waylan Haggard, please advise on refill. Thanks

## 2023-09-27 ENCOUNTER — Other Ambulatory Visit (HOSPITAL_BASED_OUTPATIENT_CLINIC_OR_DEPARTMENT_OTHER): Payer: Self-pay

## 2023-09-29 ENCOUNTER — Emergency Department (HOSPITAL_COMMUNITY)

## 2023-09-29 ENCOUNTER — Other Ambulatory Visit: Payer: Self-pay

## 2023-09-29 ENCOUNTER — Telehealth: Payer: Self-pay | Admitting: Cardiology

## 2023-09-29 ENCOUNTER — Inpatient Hospital Stay (HOSPITAL_COMMUNITY)
Admission: EM | Admit: 2023-09-29 | Discharge: 2023-10-01 | DRG: 191 | Disposition: A | Discharge status: Home / Self Care | Attending: Family Medicine | Admitting: Family Medicine

## 2023-09-29 ENCOUNTER — Encounter (HOSPITAL_COMMUNITY): Payer: Self-pay | Admitting: Emergency Medicine

## 2023-09-29 DIAGNOSIS — Z886 Allergy status to analgesic agent status: Secondary | ICD-10-CM

## 2023-09-29 DIAGNOSIS — Z8249 Family history of ischemic heart disease and other diseases of the circulatory system: Secondary | ICD-10-CM

## 2023-09-29 DIAGNOSIS — Z6828 Body mass index (BMI) 28.0-28.9, adult: Secondary | ICD-10-CM

## 2023-09-29 DIAGNOSIS — E785 Hyperlipidemia, unspecified: Secondary | ICD-10-CM | POA: Diagnosis present

## 2023-09-29 DIAGNOSIS — Z825 Family history of asthma and other chronic lower respiratory diseases: Secondary | ICD-10-CM

## 2023-09-29 DIAGNOSIS — J441 Chronic obstructive pulmonary disease with (acute) exacerbation: Secondary | ICD-10-CM | POA: Diagnosis not present

## 2023-09-29 DIAGNOSIS — I429 Cardiomyopathy, unspecified: Secondary | ICD-10-CM | POA: Diagnosis present

## 2023-09-29 DIAGNOSIS — K219 Gastro-esophageal reflux disease without esophagitis: Secondary | ICD-10-CM | POA: Diagnosis present

## 2023-09-29 DIAGNOSIS — Z79899 Other long term (current) drug therapy: Secondary | ICD-10-CM

## 2023-09-29 DIAGNOSIS — Z9981 Dependence on supplemental oxygen: Secondary | ICD-10-CM

## 2023-09-29 DIAGNOSIS — J9621 Acute and chronic respiratory failure with hypoxia: Secondary | ICD-10-CM | POA: Diagnosis not present

## 2023-09-29 DIAGNOSIS — J9611 Chronic respiratory failure with hypoxia: Secondary | ICD-10-CM | POA: Diagnosis present

## 2023-09-29 DIAGNOSIS — I5042 Chronic combined systolic (congestive) and diastolic (congestive) heart failure: Secondary | ICD-10-CM | POA: Diagnosis present

## 2023-09-29 DIAGNOSIS — M7989 Other specified soft tissue disorders: Secondary | ICD-10-CM

## 2023-09-29 DIAGNOSIS — I2510 Atherosclerotic heart disease of native coronary artery without angina pectoris: Secondary | ICD-10-CM | POA: Diagnosis present

## 2023-09-29 DIAGNOSIS — Z1152 Encounter for screening for COVID-19: Secondary | ICD-10-CM

## 2023-09-29 DIAGNOSIS — R600 Localized edema: Secondary | ICD-10-CM | POA: Diagnosis not present

## 2023-09-29 DIAGNOSIS — Z8701 Personal history of pneumonia (recurrent): Secondary | ICD-10-CM

## 2023-09-29 DIAGNOSIS — Z888 Allergy status to other drugs, medicaments and biological substances status: Secondary | ICD-10-CM

## 2023-09-29 DIAGNOSIS — J439 Emphysema, unspecified: Secondary | ICD-10-CM | POA: Diagnosis present

## 2023-09-29 DIAGNOSIS — E669 Obesity, unspecified: Secondary | ICD-10-CM | POA: Diagnosis present

## 2023-09-29 DIAGNOSIS — B9789 Other viral agents as the cause of diseases classified elsewhere: Secondary | ICD-10-CM | POA: Diagnosis present

## 2023-09-29 DIAGNOSIS — Z880 Allergy status to penicillin: Secondary | ICD-10-CM

## 2023-09-29 DIAGNOSIS — Z7952 Long term (current) use of systemic steroids: Secondary | ICD-10-CM

## 2023-09-29 DIAGNOSIS — G4733 Obstructive sleep apnea (adult) (pediatric): Secondary | ICD-10-CM | POA: Diagnosis present

## 2023-09-29 DIAGNOSIS — I110 Hypertensive heart disease with heart failure: Secondary | ICD-10-CM | POA: Diagnosis present

## 2023-09-29 DIAGNOSIS — Z823 Family history of stroke: Secondary | ICD-10-CM

## 2023-09-29 DIAGNOSIS — Z87891 Personal history of nicotine dependence: Secondary | ICD-10-CM

## 2023-09-29 DIAGNOSIS — Z8616 Personal history of COVID-19: Secondary | ICD-10-CM

## 2023-09-29 DIAGNOSIS — B9729 Other coronavirus as the cause of diseases classified elsewhere: Secondary | ICD-10-CM | POA: Diagnosis present

## 2023-09-29 LAB — BASIC METABOLIC PANEL WITH GFR
Anion gap: 9 (ref 5–15)
BUN: 18 mg/dL (ref 8–23)
CO2: 29 mmol/L (ref 22–32)
Calcium: 8.5 mg/dL — ABNORMAL LOW (ref 8.9–10.3)
Chloride: 97 mmol/L — ABNORMAL LOW (ref 98–111)
Creatinine, Ser: 0.63 mg/dL (ref 0.44–1.00)
GFR, Estimated: >60 mL/min (ref >60)
Glucose, Bld: 108 mg/dL — ABNORMAL HIGH (ref 70–99)
Potassium: 4.2 mmol/L (ref 3.5–5.1)
Sodium: 135 mmol/L (ref 135–145)

## 2023-09-29 LAB — CBC WITH DIFFERENTIAL/PLATELET
Abs Immature Granulocytes: 0.05 10*3/uL (ref 0.00–0.07)
Basophils Absolute: 0 10*3/uL (ref 0.0–0.1)
Basophils Relative: 0 %
Eosinophils Absolute: 0 10*3/uL (ref 0.0–0.5)
Eosinophils Relative: 0 %
HCT: 37.8 % (ref 36.0–46.0)
Hemoglobin: 11.6 g/dL — ABNORMAL LOW (ref 12.0–15.0)
Immature Granulocytes: 1 %
Lymphocytes Relative: 5 %
Lymphs Abs: 0.5 10*3/uL — ABNORMAL LOW (ref 0.7–4.0)
MCH: 28.5 pg (ref 26.0–34.0)
MCHC: 30.7 g/dL (ref 30.0–36.0)
MCV: 92.9 fL (ref 80.0–100.0)
Monocytes Absolute: 0.5 10*3/uL (ref 0.1–1.0)
Monocytes Relative: 6 %
Neutro Abs: 8.2 10*3/uL — ABNORMAL HIGH (ref 1.7–7.7)
Neutrophils Relative %: 88 %
Platelets: 258 10*3/uL (ref 150–400)
RBC: 4.07 MIL/uL (ref 3.87–5.11)
RDW: 15.3 % (ref 11.5–15.5)
WBC: 9.3 10*3/uL (ref 4.0–10.5)
nRBC: 0 % (ref 0.0–0.2)

## 2023-09-29 LAB — RESP PANEL BY RT-PCR (RSV, FLU A&B, COVID)  RVPGX2
Influenza A by PCR: NEGATIVE
Influenza B by PCR: NEGATIVE
Resp Syncytial Virus by PCR: NEGATIVE
SARS Coronavirus 2 by RT PCR: NEGATIVE

## 2023-09-29 LAB — BLOOD GAS, VENOUS
Acid-Base Excess: 12 mmol/L — ABNORMAL HIGH (ref 0.0–2.0)
Bicarbonate: 39.3 mmol/L — ABNORMAL HIGH (ref 20.0–28.0)
O2 Saturation: 86.5 %
Patient temperature: 37
pCO2, Ven: 62 mmHg — ABNORMAL HIGH (ref 44–60)
pH, Ven: 7.41 (ref 7.25–7.43)
pO2, Ven: 55 mmHg — ABNORMAL HIGH (ref 32–45)

## 2023-09-29 LAB — BRAIN NATRIURETIC PEPTIDE: B Natriuretic Peptide: 58.2 pg/mL (ref 0.0–100.0)

## 2023-09-29 MED ORDER — METHYLPREDNISOLONE SODIUM SUCC 125 MG IJ SOLR: 125.0000 mg | Freq: Once | INTRAMUSCULAR | Status: AC

## 2023-09-29 MED ORDER — IPRATROPIUM-ALBUTEROL 0.5-2.5 (3) MG/3ML IN SOLN
3.0000 mL | Freq: Four times a day (QID) | RESPIRATORY_TRACT | Status: DC
  Administered 2023-09-30 – 2023-10-01 (×7): 3 mL via RESPIRATORY_TRACT
  Filled 2023-09-29 (×7): qty 3

## 2023-09-29 MED ORDER — SENNOSIDES-DOCUSATE SODIUM 8.6-50 MG PO TABS: 1.0000 | ORAL_TABLET | Freq: Every evening | ORAL | Status: DC | PRN

## 2023-09-29 MED ORDER — GUAIFENESIN ER 600 MG PO TB12: 600.0000 mg | ORAL_TABLET | Freq: Two times a day (BID) | ORAL | Status: DC | PRN

## 2023-09-29 MED ORDER — PANTOPRAZOLE SODIUM 40 MG PO TBEC
40.0000 mg | DELAYED_RELEASE_TABLET | Freq: Every day | ORAL | Status: DC
  Administered 2023-09-30 – 2023-10-01 (×2): 40 mg via ORAL
  Filled 2023-09-29 (×3): qty 1

## 2023-09-29 MED ORDER — ROCURONIUM BROMIDE 10 MG/ML (PF) SYRINGE
80.0000 mg | PREFILLED_SYRINGE | Freq: Once | INTRAVENOUS | Status: DC
  Filled 2023-09-29: qty 10

## 2023-09-29 MED ORDER — IPRATROPIUM-ALBUTEROL 0.5-2.5 (3) MG/3ML IN SOLN: 3.0000 mL | Freq: Four times a day (QID) | RESPIRATORY_TRACT | Status: DC

## 2023-09-29 MED ORDER — ONDANSETRON HCL 4 MG PO TABS: 4.0000 mg | ORAL_TABLET | Freq: Four times a day (QID) | ORAL | Status: DC | PRN

## 2023-09-29 MED ORDER — FUROSEMIDE 10 MG/ML IJ SOLN
20.0000 mg | Freq: Once | INTRAMUSCULAR | Status: AC
  Administered 2023-09-29: 20 mg via INTRAVENOUS
  Filled 2023-09-29: qty 2

## 2023-09-29 MED ORDER — ENOXAPARIN SODIUM 40 MG/0.4ML IJ SOSY
40.0000 mg | PREFILLED_SYRINGE | INTRAMUSCULAR | Status: DC
  Administered 2023-09-29 – 2023-09-30 (×2): 40 mg via SUBCUTANEOUS
  Filled 2023-09-29 (×2): qty 0.4

## 2023-09-29 MED ORDER — IPRATROPIUM-ALBUTEROL 0.5-2.5 (3) MG/3ML IN SOLN
RESPIRATORY_TRACT | Status: AC
  Administered 2023-09-29: 3 mL
  Filled 2023-09-29: qty 9

## 2023-09-29 MED ORDER — PREDNISONE 20 MG PO TABS
60.0000 mg | ORAL_TABLET | Freq: Every day | ORAL | Status: DC
  Administered 2023-09-30 – 2023-10-01 (×2): 60 mg via ORAL
  Filled 2023-09-29 (×2): qty 3

## 2023-09-29 MED ORDER — AZITHROMYCIN 500 MG PO TABS: 500.0000 mg | ORAL_TABLET | Freq: Every day | ORAL | Status: DC

## 2023-09-29 MED ORDER — ONDANSETRON HCL 4 MG/2ML IJ SOLN
4.0000 mg | Freq: Four times a day (QID) | INTRAMUSCULAR | Status: DC | PRN
Start: 2023-09-29 — End: 2023-10-01

## 2023-09-29 MED ORDER — METHYLPREDNISOLONE SODIUM SUCC 125 MG IJ SOLR
INTRAMUSCULAR | Status: AC
  Administered 2023-09-29: 125 mg via INTRAVENOUS
  Filled 2023-09-29: qty 2

## 2023-09-29 MED ORDER — SODIUM CHLORIDE 0.9 % IV SOLN
500.0000 mg | INTRAVENOUS | Status: AC
  Administered 2023-09-29: 500 mg via INTRAVENOUS
  Filled 2023-09-29: qty 5

## 2023-09-29 MED ORDER — BUDESON-GLYCOPYRROL-FORMOTEROL 160-9-4.8 MCG/ACT IN AERO
2.0000 | INHALATION_SPRAY | Freq: Two times a day (BID) | RESPIRATORY_TRACT | Status: DC
  Administered 2023-09-30 – 2023-10-01 (×3): 2 via RESPIRATORY_TRACT
  Filled 2023-09-29: qty 5.9

## 2023-09-29 MED ORDER — MAGNESIUM SULFATE 2 GM/50ML IV SOLN
2.0000 g | Freq: Once | INTRAVENOUS | Status: AC
  Administered 2023-09-29: 2 g via INTRAVENOUS
  Filled 2023-09-29: qty 50

## 2023-09-29 MED ORDER — ALBUTEROL SULFATE (2.5 MG/3ML) 0.083% IN NEBU
10.0000 mg | INHALATION_SOLUTION | RESPIRATORY_TRACT | Status: AC
  Administered 2023-09-29: 10 mg via RESPIRATORY_TRACT
  Filled 2023-09-29: qty 12

## 2023-09-29 MED ORDER — ETOMIDATE 2 MG/ML IV SOLN
0.3000 mg/kg | Freq: Once | INTRAVENOUS | Status: DC
  Filled 2023-09-29: qty 20

## 2023-09-29 NOTE — Telephone Encounter (Signed)
 Called the patient and she reported that her legs, feet and ankles were swollen and she is currently not taking any diuretics. She is SOB only with exertion. She also states that she was recently in the hospital for exacerbation of COPD and she is on O2 @ 4 liters. She was sent home from the hospital on prednisone  and antibiotics. She does not have the ability to check her blood pressure. Patient is asking what the next steps are?

## 2023-09-29 NOTE — Telephone Encounter (Signed)
 Pt requesting a c/b in regards to previous phone note. Please advise

## 2023-09-29 NOTE — ED Provider Notes (Signed)
 [place] EMERGENCY DEPARTMENT AT [facility] Provider Note   CSN: [id number] Arrival date & time: 09/29/23  1922     History  Chief Complaint  Patient presents with   Shortness of Breath    [name] is a 63 y.o. female with a history of COPD presenting to the ED with acute onset worsening shortness of breath.  The patient wears 5 L nasal cannula at home.  She reports that her breathing got significantly worse suddenly this afternoon.  She drove herself to the ED.  Per my review of external records the patient just been discharged from the hospital 6 days ago after admission for acute on chronic respiratory failure with COPD exacerbation requiring BiPAP on admission.  She was discharged on oral doxycycline  as well as a prednisone  taper.  HPI     Home Medications Prior to Admission medications   Medication Sig Start Date End Date Taking? Authorizing Provider  albuterol  (PROVENTIL ) (2.5 MG/3ML) 0.083% nebulizer solution Take 3 mLs (2.5 mg total) by nebulization every 6 (six) hours as needed for wheezing or shortness of breath. 04/05/23   Mannam, Praveen, MD  budeson-glycopyrrolate -formoterol  (BREZTRI  AEROSPHERE) 160-9-4.8 MCG/ACT AERO inhaler Inhale 2 puffs into the lungs in the morning and at bedtime. 09/23/23   Lorita Rosa, MD  Evolocumab  (REPATHA  SURECLICK) 140 MG/ML SOAJ Inject 140 mg into the skin every 14 (fourteen) days. 09/06/23   Revankar, Rajan R, MD  guaiFENesin  (MUCINEX ) 600 MG 12 hr tablet Take 1 tablet (600 mg total) by mouth 2 (two) times daily as needed for cough. 09/23/23   Lorita Rosa, MD  ipratropium-albuterol  (DUONEB) 0.5-2.5 (3) MG/3ML SOLN Take 3 mLs by nebulization every 6 (six) hours. 09/23/23   Lorita Rosa, MD  nitroGLYCERIN  (NITROSTAT ) 0.4 MG SL tablet Place 1 tablet (0.4 mg total) under the tongue every 5 (five) minutes as needed. 07/21/23 10/19/23  Revankar, Micael Adas, MD  omeprazole  (PRILOSEC) 20 MG capsule TAKE 1 CAPSULE BY MOUTH EVERY  DAY 07/24/23   Mannam, Praveen, MD  OXYGEN  Inhale 4 L/min into the lungs See admin instructions. 5 L/min at bedtime and as needed for shortness of breath throughout the daytime    [provider]  predniSONE  (DELTASONE ) 10 MG tablet Take 10 mg by mouth daily. 09/21/23   [provider]  predniSONE  (DELTASONE ) 10 MG tablet Take 4 tablets (40 mg total) by mouth daily for 5 days, THEN 3 tablets (30 mg total) daily for 5 days, THEN 2 tablets (20 mg total) daily for 5 days, THEN 1 tablet (10 mg total) daily for 5 days. 09/23/23 10/13/23  Lorita Rosa, MD      Allergies    Amoxicillin , Statins, Tylenol  [acetaminophen ], and Zetia  [ezetimibe ]    Review of Systems   Review of Systems  Physical Exam Updated Vital Signs BP 127/81   Pulse 73   Temp 97.8 F (36.6 C) (Oral)   Resp 20   Ht 5\' 1"  (1.549 m)   Wt 67.6 kg   SpO2 99%   BMI 28.16 kg/m  Physical Exam Constitutional:      General: She is not in acute distress. HENT:     Head: Normocephalic and atraumatic.  Eyes:     Conjunctiva/sclera: Conjunctivae normal.     Pupils: Pupils are equal, round, and reactive to light.  Cardiovascular:     Rate and Rhythm: Regular rhythm. Tachycardia present.  Pulmonary:     Comments: Tripoding, speaking in short sentences, faint breath sounds bilaterally, audible wheezing,  RR 35-40 bpm Abdominal:     General: There is no distension.     Tenderness: There is no abdominal tenderness.  Musculoskeletal:     Right lower leg: Edema present.     Left lower leg: Edema present.  Skin:    General: Skin is warm and dry.  Neurological:     General: No focal deficit present.     Mental Status: She is alert. Mental status is at baseline.  Psychiatric:        Mood and Affect: Mood normal.        Behavior: Behavior normal.     ED Results / Procedures / Treatments   Labs (all labs ordered are listed, but only abnormal results are displayed) Labs Reviewed  BASIC METABOLIC PANEL WITH GFR  - Abnormal; Notable for the following components:      Result Value   Chloride 97 (*)    Glucose, Bld 108 (*)    Calcium 8.5 (*)    All other components within normal limits  CBC WITH DIFFERENTIAL/PLATELET - Abnormal; Notable for the following components:   Hemoglobin 11.6 (*)    Neutro Abs 8.2 (*)    Lymphs Abs 0.5 (*)    All other components within normal limits  BLOOD GAS, VENOUS - Abnormal; Notable for the following components:   pCO2, Ven 62 (*)    pO2, Ven 55 (*)    Bicarbonate 39.3 (*)    Acid-Base Excess 12.0 (*)    All other components within normal limits  RESP PANEL BY RT-PCR (RSV, FLU A&B, COVID)  [id number]  BRAIN NATRIURETIC PEPTIDE    EKG EKG Interpretation Date/Time:  Friday Sep 29 2023 19:30:10 EDT Ventricular Rate:  105 PR Interval:  172 QRS Duration:  114 QT Interval:  343 QTC Calculation: 454 R Axis:   83  Text Interpretation: Sinus tachycardia Borderline intraventricular conduction delay Artifact in lead(s) I II III aVR aVL aVF V1 V2 V3 V4 V5 V6 Confirmed by Jerald Molly [phone]) on 09/29/2023 7:43:20 PM  Radiology DG Chest Port 1 View Result Date: 09/29/2023 CLINICAL DATA:  Shortness of breath for 2 hours EXAM: PORTABLE CHEST 1 VIEW COMPARISON:  09/21/2023 FINDINGS: Cardiac shadow is stable. Lungs are hyperinflated bilaterally. No focal infiltrate or effusion is seen. No bony abnormality is noted. IMPRESSION: Emphysematous changes without acute abnormality. Electronically Signed   By: Violeta Grey M.D.   On: 09/29/2023 20:12    Procedures .Critical Care  Performed by: Arvilla Birmingham, MD Authorized by: Arvilla Birmingham, MD   Critical care provider statement:    Critical care time (minutes):  40   Critical care time was exclusive of:  Separately billable procedures and treating other patients   Critical care was necessary to treat or prevent imminent or life-threatening deterioration of the following conditions:  Respiratory failure   Critical care  was time spent personally by me on the following activities:  Ordering and performing treatments and interventions, ordering and review of laboratory studies, ordering and review of radiographic studies, pulse oximetry, review of old charts, examination of patient and evaluation of patient's response to treatment   Care discussed with: admitting provider   Comments:     COPD exacerbation requiring bipap     Medications Ordered in ED Medications  albuterol  (PROVENTIL ) (2.5 MG/3ML) 0.083% nebulizer solution 10 mg (10 mg Nebulization New Bag/Given 09/29/23 2001)  methylPREDNISolone  sodium succinate (SOLU-MEDROL ) 125 mg/2 mL injection 125 mg (125 mg Intravenous Given 09/29/23 1943)  magnesium   sulfate IVPB 2 g 50 mL (0 g Intravenous Stopped 09/29/23 2031)  ipratropium-albuterol  (DUONEB) 0.5-2.5 (3) MG/3ML nebulizer solution (3 mLs  Given 09/29/23 1943)    ED Course/ Medical Decision Making/ A&P Clinical Course as of 09/29/23 2048  Fri Sep 29, 2023  1933 RT at bedside placing pt on bipap, nebulizer running [MT]  1946 Pt already appearing more comfortable on bipap, awake and able to speak to me, WOB and resp rate has improved.  [MT]  2011 Reassessed - improved effort, RR back to normal, BP normalized [MT]  2013 VBG with bicarb 39, pCO2 62 [MT]  2027 Stable now for admission on bipap [MT]  2038 Admitted to hospitalist [MT]    Clinical Course User Index [MT] Allahna Husband, Janalyn Me, MD                                 Medical Decision Making Amount and/or Complexity of Data Reviewed Labs: ordered. Radiology: ordered.  Risk Prescription drug management. Decision regarding hospitalization.   This patient presents to the ED with concern for shortness of breath. This involves an extensive number of treatment options, and is a complaint that carries with it a high risk of complications and morbidity.  The differential diagnosis includes recurring COPD exacerbation versus pneumonia versus pleural  effusion versus pneumothorax versus other  Co-morbidities that complicate the patient evaluation: History of brittle longstanding COPD, recurring exacerbations   External records from outside source obtained and reviewed including hospital discharge summary from 6 days ago for similar presentation  I ordered and personally interpreted labs.  The pertinent results include: No emergent findings.  BNP negative.  I ordered imaging studies including x-ray of the chest I independently visualized and interpreted imaging which showed no emergent findings.  Emphysematous changes I agree with the radiologist interpretation  The patient was maintained on a cardiac monitor.  I personally viewed and interpreted the cardiac monitored which showed an underlying rhythm of: Sinus tachycardia  Per my interpretation the patient's ECG shows sinus tachycardia with breathing motion artifact  I ordered medication including bipap, IV steroids, nebulizers, magnesium  for suspected COPD exacerbation  I have reviewed the patients home medicines and have made adjustments as needed  Test Considered: Lower suspicion for acute PE in this clinical presentation   After the interventions noted above, I reevaluated the patient and found that they have: improved   Disposition:  After consideration of the diagnostic results and the patients response to treatment, I feel that the patient would benefit from medical admission.         Final Clinical Impression(s) / ED Diagnoses Final diagnoses:  COPD exacerbation [facility])    Rx / DC Orders ED Discharge Orders     None         Arvilla Birmingham, MD 09/29/23 2049

## 2023-09-29 NOTE — Telephone Encounter (Signed)
 Pt c/o swelling/edema: STAT if pt has developed SOB within 24 hours  If swelling, where is the swelling located?   Feet, legs and ankles  How much weight have you gained and in what time span?   Not sure  Have you gained 2 pounds in a day or 5 pounds in a week?     Not sure  Do you have a log of your daily weights (if so, list)?   No  Are you currently taking a fluid pill?   No  Are you currently SOB?   Yes  Have you traveled recently in a car or plane for an extended period of time?   No  Patient is concerned she has been having swelling in her legs, feet and ankles and wants to know next steps.

## 2023-09-29 NOTE — ED Triage Notes (Signed)
 Patient c/o SOB x 2 hours. Patient report worsening SOB tonight. Patient report taking inhaler without relief. Patient report bilateral LE edema. Hx COPD.
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
## 2023-09-30 ENCOUNTER — Observation Stay (HOSPITAL_COMMUNITY)

## 2023-09-30 DIAGNOSIS — J9621 Acute and chronic respiratory failure with hypoxia: Secondary | ICD-10-CM | POA: Diagnosis not present

## 2023-09-30 DIAGNOSIS — G4733 Obstructive sleep apnea (adult) (pediatric): Secondary | ICD-10-CM | POA: Diagnosis present

## 2023-09-30 DIAGNOSIS — M7989 Other specified soft tissue disorders: Secondary | ICD-10-CM | POA: Diagnosis not present

## 2023-09-30 DIAGNOSIS — I5042 Chronic combined systolic (congestive) and diastolic (congestive) heart failure: Secondary | ICD-10-CM | POA: Diagnosis present

## 2023-09-30 DIAGNOSIS — R600 Localized edema: Secondary | ICD-10-CM | POA: Diagnosis not present

## 2023-09-30 DIAGNOSIS — Z880 Allergy status to penicillin: Secondary | ICD-10-CM | POA: Diagnosis not present

## 2023-09-30 DIAGNOSIS — Z888 Allergy status to other drugs, medicaments and biological substances status: Secondary | ICD-10-CM | POA: Diagnosis not present

## 2023-09-30 DIAGNOSIS — B9729 Other coronavirus as the cause of diseases classified elsewhere: Secondary | ICD-10-CM | POA: Diagnosis present

## 2023-09-30 DIAGNOSIS — K219 Gastro-esophageal reflux disease without esophagitis: Secondary | ICD-10-CM | POA: Diagnosis present

## 2023-09-30 DIAGNOSIS — J439 Emphysema, unspecified: Secondary | ICD-10-CM | POA: Diagnosis present

## 2023-09-30 DIAGNOSIS — I110 Hypertensive heart disease with heart failure: Secondary | ICD-10-CM | POA: Diagnosis present

## 2023-09-30 DIAGNOSIS — Z79899 Other long term (current) drug therapy: Secondary | ICD-10-CM | POA: Diagnosis not present

## 2023-09-30 DIAGNOSIS — Z8616 Personal history of COVID-19: Secondary | ICD-10-CM | POA: Diagnosis not present

## 2023-09-30 DIAGNOSIS — Z1152 Encounter for screening for COVID-19: Secondary | ICD-10-CM | POA: Diagnosis not present

## 2023-09-30 DIAGNOSIS — Z7952 Long term (current) use of systemic steroids: Secondary | ICD-10-CM | POA: Diagnosis not present

## 2023-09-30 DIAGNOSIS — I429 Cardiomyopathy, unspecified: Secondary | ICD-10-CM | POA: Diagnosis present

## 2023-09-30 DIAGNOSIS — Z87891 Personal history of nicotine dependence: Secondary | ICD-10-CM | POA: Diagnosis not present

## 2023-09-30 DIAGNOSIS — B9789 Other viral agents as the cause of diseases classified elsewhere: Secondary | ICD-10-CM | POA: Diagnosis present

## 2023-09-30 DIAGNOSIS — I2510 Atherosclerotic heart disease of native coronary artery without angina pectoris: Secondary | ICD-10-CM | POA: Diagnosis present

## 2023-09-30 DIAGNOSIS — J441 Chronic obstructive pulmonary disease with (acute) exacerbation: Secondary | ICD-10-CM | POA: Diagnosis present

## 2023-09-30 DIAGNOSIS — Z8249 Family history of ischemic heart disease and other diseases of the circulatory system: Secondary | ICD-10-CM | POA: Diagnosis not present

## 2023-09-30 DIAGNOSIS — E669 Obesity, unspecified: Secondary | ICD-10-CM | POA: Diagnosis present

## 2023-09-30 DIAGNOSIS — Z6828 Body mass index (BMI) 28.0-28.9, adult: Secondary | ICD-10-CM | POA: Diagnosis not present

## 2023-09-30 DIAGNOSIS — E785 Hyperlipidemia, unspecified: Secondary | ICD-10-CM | POA: Diagnosis present

## 2023-09-30 DIAGNOSIS — Z9981 Dependence on supplemental oxygen: Secondary | ICD-10-CM | POA: Diagnosis not present

## 2023-09-30 DIAGNOSIS — J9611 Chronic respiratory failure with hypoxia: Secondary | ICD-10-CM | POA: Diagnosis present

## 2023-09-30 DIAGNOSIS — Z886 Allergy status to analgesic agent status: Secondary | ICD-10-CM | POA: Diagnosis not present

## 2023-09-30 LAB — RESPIRATORY PANEL BY PCR
Adenovirus: NOT DETECTED
Bordetella Parapertussis: NOT DETECTED
Bordetella pertussis: NOT DETECTED
Chlamydophila pneumoniae: NOT DETECTED
Coronavirus 229E: DETECTED — AB
Coronavirus HKU1: NOT DETECTED
Coronavirus NL63: NOT DETECTED
Coronavirus OC43: NOT DETECTED
Influenza A: NOT DETECTED
Influenza B: NOT DETECTED
Metapneumovirus: NOT DETECTED
Mycoplasma pneumoniae: NOT DETECTED
Parainfluenza Virus 1: NOT DETECTED
Parainfluenza Virus 2: NOT DETECTED
Parainfluenza Virus 3: NOT DETECTED
Parainfluenza Virus 4: NOT DETECTED
Respiratory Syncytial Virus: NOT DETECTED
Rhinovirus / Enterovirus: DETECTED — AB

## 2023-09-30 LAB — ECHOCARDIOGRAM COMPLETE
AR max vel: 2.5 cm2
AV Area VTI: 2.68 cm2
AV Area mean vel: 2.58 cm2
AV Mean grad: 8 mmHg
AV Peak grad: 14.9 mmHg
Ao pk vel: 1.93 m/s
Area-P 1/2: 2.69 cm2
Height: 61 in
S' Lateral: 3.7 cm
Weight: 2384.5 [oz_av]

## 2023-09-30 MED ORDER — POTASSIUM CHLORIDE CRYS ER 20 MEQ PO TBCR
40.0000 meq | EXTENDED_RELEASE_TABLET | Freq: Once | ORAL | Status: AC
  Administered 2023-09-30: 40 meq via ORAL
  Filled 2023-09-30: qty 2

## 2023-09-30 MED ORDER — METHOCARBAMOL 500 MG PO TABS
500.0000 mg | ORAL_TABLET | Freq: Three times a day (TID) | ORAL | Status: AC | PRN
  Administered 2023-09-30 (×2): 500 mg via ORAL
  Filled 2023-09-30 (×2): qty 1

## 2023-09-30 MED ORDER — FUROSEMIDE 10 MG/ML IJ SOLN
40.0000 mg | Freq: Two times a day (BID) | INTRAMUSCULAR | Status: DC
  Administered 2023-09-30: 40 mg via INTRAVENOUS
  Filled 2023-09-30 (×2): qty 4

## 2023-09-30 NOTE — Progress Notes (Signed)
  Progress Note   Patient: [name] UJW:[id number] DOB: 1961/01/04 DOA: 09/29/2023     0 DOS: the patient was seen and examined on 09/30/2023        Brief hospital course: 63 y.o. female with medical history significant for prediabetes, HTN, HLD, obesity, OSA, CAD, cardiomyopathy, COPD on 5 L [place], hiatal hernia, and GERD who presented to the ED for evaluation of shortness of breath. Patient reports she was discharged on 5/2:10 day hospitalization for COPD exacerbation. She was able to go back to work and has been doing well until last night when she started having shortness of breath. Her shortness of breath worsened while at work today with associated wheezing so she presented to the ED for further evaluation.   In the ER, found to have leg swelling bilaterally.  Also positive for Rhinovirus and coronavirus 229E     Assessment and Plan: COPD exacerbation due to dual respiratory viruses Chronic respiratory failure Obesity Sleep apnea - Continue steroids - Stop antibiotics given multiple recent courses, viral panel, no new sputum - Continue inhalers   Possible acute on chronic systolic and diastolic CHF Recent EF improved from preivous.  Repeat here normal, no other new findings except increased RA pressure. - Continue IV Lasix  - Daily BMP  Coronary artery disease Hyperlipidemia On Repatha  as an outpatient Blood pressure controlled - Continue furosemide        Subjective: Patient is still quite short of breath and weak.  No fever, no confusion.  Leg swelling is improving.     Physical Exam: BP 107/67 (BP Location: Right Arm)   Pulse 78   Temp 98.2 F (36.8 C) (Oral)   Resp 18   Ht 5\' 1"  (1.549 m)   Wt 67.6 kg   SpO2 (!) 87%   BMI 28.16 kg/m   Elderly adult female, sitting up in bed, interactive and appropriate RRR, no murmurs, no peripheral edema Respiratory rate normal, wheezing bilaterally, appears dyspneic with ambulation Abdomen soft, no tenderness  palpation Attention normal, affect normal, judgment and insight appear normal    Data Reviewed: CBC unremarkable Basic metabolic panel unremarkable   Family Communication: None    Disposition: Status is: Inpatient The patient requires ongoing Lasix          Author: Ephriam Hashimoto, MD 09/30/2023 7:25 PM  For on call review [email or web address].

## 2023-09-30 NOTE — Progress Notes (Signed)
   09/30/23 2215  BiPAP/CPAP/SIPAP  Reason BIPAP/CPAP not in use Other(comment) (PATIENT DOES NOT WEAR, NOT IN ROOM)  BiPAP/CPAP /SiPAP Vitals  Resp 16  MEWS Score/Color  MEWS Score 0  MEWS Score Color Marrie Sizer

## 2023-09-30 NOTE — Progress Notes (Signed)
 Echocardiogram 2D Echocardiogram has been performed.  Emmaline Haring Jodie Leiner RDCS 09/30/2023, 9:43 AM
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
## 2023-09-30 NOTE — Plan of Care (Signed)

## 2023-10-01 ENCOUNTER — Inpatient Hospital Stay (HOSPITAL_COMMUNITY)

## 2023-10-01 DIAGNOSIS — M7989 Other specified soft tissue disorders: Secondary | ICD-10-CM | POA: Diagnosis not present

## 2023-10-01 DIAGNOSIS — J441 Chronic obstructive pulmonary disease with (acute) exacerbation: Secondary | ICD-10-CM | POA: Diagnosis not present

## 2023-10-01 LAB — COMPREHENSIVE METABOLIC PANEL WITH GFR
ALT: 18 U/L (ref 0–44)
AST: 14 U/L — ABNORMAL LOW (ref 15–41)
Albumin: 3.1 g/dL — ABNORMAL LOW (ref 3.5–5.0)
Alkaline Phosphatase: 38 U/L (ref 38–126)
Anion gap: 9 (ref 5–15)
BUN: 16 mg/dL (ref 8–23)
CO2: 28 mmol/L (ref 22–32)
Calcium: 8.2 mg/dL — ABNORMAL LOW (ref 8.9–10.3)
Chloride: 101 mmol/L (ref 98–111)
Creatinine, Ser: 0.53 mg/dL (ref 0.44–1.00)
GFR, Estimated: >60 mL/min (ref >60)
Glucose, Bld: 76 mg/dL (ref 70–99)
Potassium: 4 mmol/L (ref 3.5–5.1)
Sodium: 138 mmol/L (ref 135–145)
Total Bilirubin: 0.9 mg/dL (ref 0.0–1.2)
Total Protein: 5.7 g/dL — ABNORMAL LOW (ref 6.5–8.1)

## 2023-10-01 LAB — CBC
HCT: 37.6 % (ref 36.0–46.0)
Hemoglobin: 11.5 g/dL — ABNORMAL LOW (ref 12.0–15.0)
MCH: 28.9 pg (ref 26.0–34.0)
MCHC: 30.6 g/dL (ref 30.0–36.0)
MCV: 94.5 fL (ref 80.0–100.0)
Platelets: 220 10*3/uL (ref 150–400)
RBC: 3.98 MIL/uL (ref 3.87–5.11)
RDW: 14.9 % (ref 11.5–15.5)
WBC: 9.3 10*3/uL (ref 4.0–10.5)
nRBC: 0 % (ref 0.0–0.2)

## 2023-10-01 MED ORDER — PREDNISONE 10 MG PO TABS: ORAL_TABLET | ORAL | 0 refills | Status: DC

## 2023-10-01 NOTE — Progress Notes (Deleted)
 f  10/01/23 0100  Psychosocial  Needs Expressed Physical

## 2023-10-01 NOTE — Plan of Care (Signed)

## 2023-10-01 NOTE — Progress Notes (Signed)
 BLE venous duplex has been completed.   Results can be found under chart review under CV PROC. 10/01/2023 1:57 PM Jerrion Tabbert RVT, RDMS

## 2023-10-01 NOTE — Progress Notes (Signed)
 Patient's 02 sat is 88 to 89 % with 4 litre 02 [place] when walking in the hall. 02 sat is 98 % with 02 4 litre [place] when resting in the bed.

## 2023-10-01 NOTE — Discharge Summary (Signed)
 Physician Discharge Summary   Patient: [name] MRN: [id number] DOB: 01/20/1961  Admit date:     09/29/2023  Discharge date: 10/01/23  Discharge Physician: [name]   PCP: Tami Fu, MD     Recommendations at discharge:  Follow up with PCP [name] in 1 week for COPD flare, viral URI and mild CHF [name]: Please check BMP in 1 week If leg still swollen, consider daily Lasix      Discharge Diagnoses: Principal discharge diagnosis:   COPD exacerbation Active Problems:   Chronic respiratory failure   Obesity   Sleep apnea   Chronic systolic and diastolic congestive heart failure   Coronary artery disease   Hyperlipidemia   Hospital Course: 63 y.o. F with COPD on 5 L at baseline, HTN, HLD, OSA, CAD, OSA and hiatal hernia who presented with shortness of breath and wheezing.  Found to have rhinovirus, coronavirus 229E, and bilateral leg swelling.      COPD exacerbation due to dual respiratory viruses The patient was admitted and found to have rhinovirus and coronavirus 229E infection.  Steroids, bronchodilators were resumed.  Over 48 hours, she gradually improved, was weaned to her home oxygen , unable to ambulate without significant dyspnea, and discharged with a prednisone  taper and PCP follow up.   Chronic systolic and diastolic congestive heart failure flare Acute CHF ruled out Noted to have new DOE and diastolic dysfunction on echo and leg swelling.    Bilateral LE US  showed no DVT.  She was treated with IV Lasix  and symptoms improved.    Chronic respiratory failure Acute respiratory failure ruled out.           The [place]  Controlled Substances Registry was reviewed for this patient prior to discharge.    Disposition: Home Diet recommendation:  Discharge Diet Orders (From admission, onward)     Start     Ordered   10/01/23 0000  Diet - low sodium heart healthy        10/01/23 1327              DISCHARGE MEDICATION: Allergies as of 10/01/2023       Reactions   Amoxicillin  Shortness Of Breath   Statins Other (See Comments)   Worsened the patient's COPD   Tylenol  [acetaminophen ] Shortness Of Breath   Zetia  [ezetimibe ] Other (See Comments)   Worsened the patient's COPD        Medication List     TAKE these medications    albuterol  108 (90 Base) MCG/ACT inhaler Commonly known as: VENTOLIN  HFA Inhale into the lungs every 6 (six) hours as needed for wheezing or shortness of breath.   albuterol  (2.5 MG/3ML) 0.083% nebulizer solution Commonly known as: PROVENTIL  Take 3 mLs (2.5 mg total) by nebulization every 6 (six) hours as needed for wheezing or shortness of breath.   Breztri  Aerosphere 160-9-4.8 MCG/ACT Aero inhaler Generic drug: budesonide -glycopyrrolate -formoterol  Inhale 2 puffs into the lungs in the morning and at bedtime.   guaiFENesin  600 MG 12 hr tablet Commonly known as: MUCINEX  Take 1 tablet (600 mg total) by mouth 2 (two) times daily as needed for cough.   ipratropium-albuterol  0.5-2.5 (3) MG/3ML Soln Commonly known as: DUONEB Take 3 mLs by nebulization every 6 (six) hours.   nitroGLYCERIN  0.4 MG SL tablet Commonly known as: NITROSTAT  Place 1 tablet (0.4 mg total) under the tongue every 5 (five) minutes as needed.   omeprazole  20 MG capsule Commonly known as: PRILOSEC TAKE 1 CAPSULE BY MOUTH EVERY  DAY   OXYGEN  Inhale 4 L/min into the lungs See admin instructions. 5 L/min at bedtime and as needed for shortness of breath throughout the daytime   predniSONE  10 MG tablet Commonly known as: DELTASONE  Take prednisone  40 mg (4 tabs) daily for 2 days then take prednisone  30 mg (3 tabs) daily for 1 day then take take prednisone  20 mg (1 tabs) daily for 1 day then take prednisone  10 mg (1 tab) daily for 1 day then stop Start taking on: Oct 02, 2023   Repatha  SureClick 140 MG/ML Soaj Generic drug: Evolocumab  Inject 140 mg into the skin every 14  (fourteen) days.        Follow-up Information     Osei-Bonsu, Caretha Chapel, MD. Schedule an appointment as soon as possible for a visit in 1 week(s).   Specialty: Internal Medicine Contact information: [address] [place] [address] [phone]                 Discharge Instructions     Diet - low sodium heart healthy   Complete by: As directed    Discharge instructions   Complete by: As directed    **IMPORTANT DISCHARGE INSTRUCTIONS**   From Dr. Darlyn Eke: You were admitted for a flare of COPD and probably a small flare of congestive heart fialure  Here, we treated you with steroids, albuterol  to open airways and Lasix  (to remove fluid)  Continue the steroids with a 5 day taper: Take prednisone  40 mg (4 tabs) on Mon and Tues Take prednisone  30 mg (3 tabs) on Weds Take prednisone  20 mg (2 tabs) on Thu  Take prednisone  10 mg (1 tab) on Fri  Go see your PCP [name] this week  Have him check your labs/blood work  Also have him check your legs and let him know if your legs start to swell again  For the next week, use your home albuterol  three times daily, in addition to your Breztri   After a week, you can reduce to as needed use   Increase activity slowly   Complete by: As directed        Discharge Exam: Filed Weights   09/29/23 1935  Weight: 67.6 kg    General: Pt is alert, awake, not in acute distress Cardiovascular: RRR, nl S1-S2, no murmurs appreciated.   No LE edema.   Respiratory: Normal respiratory rate and rhythm.  CTAB without rales.  Poor air movement and scant expiratory wheezes noted. Abdominal: Abdomen soft and non-tender.  No distension or HSM.   Neuro/Psych: Strength symmetric in upper and lower extremities.  Judgment and insight appear normal.  This was not a preventable readmission as the patient had a new viral infection, and new congestive heart failure.  Condition at discharge: fair  The results of significant  diagnostics from this hospitalization (including imaging, microbiology, ancillary and laboratory) are listed below for reference.   Imaging Studies: VAS US  LOWER EXTREMITY VENOUS (DVT) Result Date: 10/01/2023  Lower Venous DVT Study Patient Name:  [name]  Date of Exam:   10/01/2023 Medical Rec #: [id number]        Accession #:    [id number] Date of Birth: 12-29-1960        Patient Gender: F Patient Age:   64 years Exam Location:  [facility] Procedure:      VAS US  LOWER EXTREMITY VENOUS (DVT) Referring Phys: PROSPER AMPONSAH --------------------------------------------------------------------------------  Indications: Edema.  Comparison Study: No previous exams Performing Technologist: Jody Hill RVT,  RDMS  Examination Guidelines: A complete evaluation includes B-mode imaging, spectral Doppler, color Doppler, and power Doppler as needed of all accessible portions of each vessel. Bilateral testing is considered an integral part of a complete examination. Limited examinations for reoccurring indications may be performed as noted. The reflux portion of the exam is performed with the patient in reverse Trendelenburg.  +---------+---------------+---------+-----------+----------+--------------+ RIGHT    CompressibilityPhasicitySpontaneityPropertiesThrombus Aging +---------+---------------+---------+-----------+----------+--------------+ CFV      Full           Yes      Yes                                 +---------+---------------+---------+-----------+----------+--------------+ SFJ      Full                                                        +---------+---------------+---------+-----------+----------+--------------+ FV Prox  Full           Yes      Yes                                 +---------+---------------+---------+-----------+----------+--------------+ FV Mid   Full           Yes      Yes                                  +---------+---------------+---------+-----------+----------+--------------+ FV DistalFull           Yes      Yes                                 +---------+---------------+---------+-----------+----------+--------------+ PFV      Full                                                        +---------+---------------+---------+-----------+----------+--------------+ POP      Full           Yes      Yes                                 +---------+---------------+---------+-----------+----------+--------------+ PTV      Full                                                        +---------+---------------+---------+-----------+----------+--------------+ PERO     Full                                                        +---------+---------------+---------+-----------+----------+--------------+   +---------+---------------+---------+-----------+----------+--------------+  LEFT     CompressibilityPhasicitySpontaneityPropertiesThrombus Aging +---------+---------------+---------+-----------+----------+--------------+ CFV      Full           Yes      Yes                                 +---------+---------------+---------+-----------+----------+--------------+ SFJ      Full                                                        +---------+---------------+---------+-----------+----------+--------------+ FV Prox  Full           Yes      Yes                                 +---------+---------------+---------+-----------+----------+--------------+ FV Mid   Full           Yes      Yes                                 +---------+---------------+---------+-----------+----------+--------------+ FV DistalFull           Yes      Yes                                 +---------+---------------+---------+-----------+----------+--------------+ PFV      Full                                                         +---------+---------------+---------+-----------+----------+--------------+ POP      Full           Yes      Yes                                 +---------+---------------+---------+-----------+----------+--------------+ PTV      Full                                                        +---------+---------------+---------+-----------+----------+--------------+ PERO     Full                                                        +---------+---------------+---------+-----------+----------+--------------+     Summary: BILATERAL: - No evidence of deep vein thrombosis seen in the lower extremities, bilaterally. - RIGHT: - A cystic structure is found in the popliteal fossa ( 3.47 x 0.83 x 1.98 cm).  LEFT: - No cystic structure found in the popliteal fossa.  *See table(s) above for measurements and observations.    Preliminary    ECHOCARDIOGRAM  COMPLETE Result Date: 09/30/2023    ECHOCARDIOGRAM REPORT   Patient Name:   NYESHA CLIFF Date of Exam: 09/30/2023 Medical Rec #:  [id number]       Height:       61.0 in Accession #:    [id number]      Weight:       149.0 lb Date of Birth:  Nov 22, 1960       BSA:          1.667 m Patient Age:    63 years        BP:           131/86 mmHg Patient Gender: F               HR:           74 bpm. Exam Location:  Inpatient Procedure: 2D Echo, Color Doppler and Cardiac Doppler (Both Spectral and Color            Flow Doppler were utilized during procedure). Indications:     R60.0 Lower extremity edema  History:         Patient has prior history of Echocardiogram examinations, most                  recent 02/13/2023. CAD, COPD; Risk Factors:Hypertension and                  Dyslipidemia.  Sonographer:     Sherline Distel Senior RDCS Referring Phys:  [id number] PROSPER M AMPONSAH Diagnosing Phys: Landy Pinna Priya Mallipeddi IMPRESSIONS  1. Left ventricular ejection fraction, by estimation, is 55 to 60%. Left ventricular ejection fraction by PLAX is 55 %. The left ventricle has normal  function. The left ventricle has no regional wall motion abnormalities. Left ventricular diastolic parameters were normal.  2. Right ventricular systolic function is normal. The right ventricular size is mildly enlarged. Tricuspid regurgitation signal is inadequate for assessing PA pressure.  3. Right atrial size was mild to moderately dilated.  4. The mitral valve is normal in structure. Trivial mitral valve regurgitation.  5. The aortic valve was not well visualized. Aortic valve regurgitation is mild, eccentric. Aortic valve sclerosis is present, with no evidence of aortic valve stenosis.  6. The inferior vena cava is dilated in size with <50% respiratory variability, suggesting right atrial pressure of 15 mmHg. Comparison(s): Changes from prior study are noted. LVEF improved from 40-45% with RWMA to 55-60% now. FINDINGS  Left Ventricle: Left ventricular ejection fraction, by estimation, is 55 to 60%. Left ventricular ejection fraction by PLAX is 55 %. The left ventricle has normal function. The left ventricle has no regional wall motion abnormalities. Strain was performed and the global longitudinal strain is indeterminate. The left ventricular internal cavity size was normal in size. There is no left ventricular hypertrophy. Left ventricular diastolic parameters were normal. Right Ventricle: The right ventricular size is mildly enlarged. No increase in right ventricular wall thickness. Right ventricular systolic function is normal. Tricuspid regurgitation signal is inadequate for assessing PA pressure. Left Atrium: Left atrial size was normal in size. Right Atrium: Right atrial size was mild to moderately dilated. Pericardium: There is no evidence of pericardial effusion. Presence of epicardial fat layer. Mitral Valve: The mitral valve is normal in structure. Trivial mitral valve regurgitation. Tricuspid Valve: The tricuspid valve is grossly normal. Tricuspid valve regurgitation is trivial. Aortic Valve: The  aortic valve was not well visualized. Aortic valve regurgitation is mild. Aortic valve sclerosis is present, with no  evidence of aortic valve stenosis. Aortic valve mean gradient measures 8.0 mmHg. Aortic valve peak gradient measures 14.9 mmHg. Aortic valve area, by VTI measures 2.68 cm. Pulmonic Valve: The pulmonic valve was grossly normal. Pulmonic valve regurgitation is trivial. Aorta: The aortic root and ascending aorta are structurally normal, with no evidence of dilitation. Venous: The inferior vena cava is dilated in size with less than 50% respiratory variability, suggesting right atrial pressure of 15 mmHg. IAS/Shunts: No atrial level shunt detected by color flow Doppler. Additional Comments: 3D was performed not requiring image post processing on an independent workstation and was indeterminate.  LEFT VENTRICLE PLAX 2D LV EF:         Left            Diastology                ventricular     LV e' medial:    9.90 cm/s                ejection        LV E/e' medial:  7.5                fraction by     LV e' lateral:   13.10 cm/s                PLAX is 55      LV E/e' lateral: 5.7                %. LVIDd:         5.20 cm LVIDs:         3.70 cm LV PW:         1.00 cm LV IVS:        0.90 cm LVOT diam:     2.20 cm LV SV:         93 LV SV Index:   56 LVOT Area:     3.80 cm  RIGHT VENTRICLE RV S prime:     12.50 cm/s TAPSE (M-mode): 2.2 cm LEFT ATRIUM             Index        RIGHT ATRIUM           Index LA diam:        3.60 cm 2.16 cm/m   RA Area:     20.20 cm LA Vol (A2C):   51.1 ml 30.66 ml/m  RA Volume:   62.90 ml  37.74 ml/m LA Vol (A4C):   26.4 ml 15.84 ml/m LA Biplane Vol: 37.6 ml 22.56 ml/m  AORTIC VALVE AV Area (Vmax):    2.50 cm AV Area (Vmean):   2.58 cm AV Area (VTI):     2.68 cm AV Vmax:           193.00 cm/s AV Vmean:          131.500 cm/s AV VTI:            0.347 m AV Peak Grad:      14.9 mmHg AV Mean Grad:      8.0 mmHg LVOT Vmax:         127.00 cm/s LVOT Vmean:        89.300 cm/s LVOT  VTI:          0.245 m LVOT/AV VTI ratio: 0.71  AORTA Ao Root diam: 3.00 cm Ao Asc diam:  3.50 cm MITRAL VALVE MV Area (PHT): 2.69 cm    SHUNTS  MV Decel Time: 282 msec    Systemic VTI:  0.24 m MV E velocity: 74.70 cm/s  Systemic Diam: 2.20 cm MV A velocity: 60.60 cm/s MV E/A ratio:  1.23 Vishnu Priya Mallipeddi Electronically signed by Lucetta Russel Mallipeddi Signature Date/Time: 09/30/2023/12:25:08 PM    Final (Updated)    DG Chest Port 1 View Result Date: 09/29/2023 CLINICAL DATA:  Shortness of breath for 2 hours EXAM: PORTABLE CHEST 1 VIEW COMPARISON:  09/21/2023 FINDINGS: Cardiac shadow is stable. Lungs are hyperinflated bilaterally. No focal infiltrate or effusion is seen. No bony abnormality is noted. IMPRESSION: Emphysematous changes without acute abnormality. Electronically Signed   By: Violeta Grey M.D.   On: 09/29/2023 20:12   DG Chest Port 1 View Result Date: 09/21/2023 CLINICAL DATA:  Shortness of breath. EXAM: PORTABLE CHEST 1 VIEW COMPARISON:  05/22/2023. FINDINGS: Stable mild cardiomegaly. Chronic emphysematous changes. Mild bibasilar linear scarring. No focal consolidation, sizeable pleural effusion, or pneumothorax. No acute osseous abnormality. IMPRESSION: 1. No acute cardiopulmonary findings. 2. Chronic emphysematous changes. Electronically Signed   By: Mannie Seek M.D.   On: 09/21/2023 13:23    Microbiology: Results for orders placed or performed during the hospital encounter of 09/29/23  Resp panel by RT-PCR (RSV, Flu A&B, Covid) Anterior Nasal Swab     Status: None   Collection Time: 09/29/23  8:48 PM   Specimen: Anterior Nasal Swab  Result Value Ref Range Status   SARS Coronavirus 2 by RT PCR NEGATIVE NEGATIVE Final    Comment: (NOTE) SARS-CoV-2 target nucleic acids are NOT DETECTED.  The SARS-CoV-2 RNA is generally detectable in upper respiratory specimens during the acute phase of infection. The lowest concentration of SARS-CoV-2 viral copies this assay can detect  is 138 copies/mL. A negative result does not preclude SARS-Cov-2 infection and should not be used as the sole basis for treatment or other patient management decisions. A negative result may occur with  improper specimen collection/handling, submission of specimen other than nasopharyngeal swab, presence of viral mutation(s) within the areas targeted by this assay, and inadequate number of viral copies(<138 copies/mL). A negative result must be combined with clinical observations, patient history, and epidemiological information. The expected result is Negative.  Fact Sheet for Patients:  BloggerCourse.com  Fact Sheet for Healthcare Providers:  SeriousBroker.it  This test is no t yet approved or cleared by the [place]  FDA and  has been authorized for detection and/or diagnosis of SARS-CoV-2 by FDA under an Emergency Use Authorization (EUA). This EUA will remain  in effect (meaning this test can be used) for the duration of the COVID-19 declaration under Section 564(b)(1) of the Act, 21 U.S.C.section 360bbb-3(b)(1), unless the authorization is terminated  or revoked sooner.       Influenza A by PCR NEGATIVE NEGATIVE Final   Influenza B by PCR NEGATIVE NEGATIVE Final    Comment: (NOTE) The Xpert Xpress SARS-CoV-2/FLU/RSV plus assay is intended as an aid in the diagnosis of influenza from Nasopharyngeal swab specimens and should not be used as a sole basis for treatment. Nasal washings and aspirates are unacceptable for Xpert Xpress SARS-CoV-2/FLU/RSV testing.  Fact Sheet for Patients: BloggerCourse.com  Fact Sheet for Healthcare Providers: SeriousBroker.it  This test is not yet approved or cleared by the [place]  FDA and has been authorized for detection and/or diagnosis of SARS-CoV-2 by FDA under an Emergency Use Authorization (EUA). This EUA will remain in effect  (meaning this test can be used) for the duration of the COVID-19 declaration under  Section 564(b)(1) of the Act, 21 U.S.C. section 360bbb-3(b)(1), unless the authorization is terminated or revoked.     Resp Syncytial Virus by PCR NEGATIVE NEGATIVE Final    Comment: (NOTE) Fact Sheet for Patients: BloggerCourse.com  Fact Sheet for Healthcare Providers: SeriousBroker.it  This test is not yet approved or cleared by the [place]  FDA and has been authorized for detection and/or diagnosis of SARS-CoV-2 by FDA under an Emergency Use Authorization (EUA). This EUA will remain in effect (meaning this test can be used) for the duration of the COVID-19 declaration under Section 564(b)(1) of the Act, 21 U.S.C. section 360bbb-3(b)(1), unless the authorization is terminated or revoked.  Performed at [facility], [address], [place], [place] [address]   Respiratory (~20 pathogens) panel by PCR     Status: Abnormal   Collection Time: 09/29/23 11:53 PM   Specimen: Nasopharyngeal Swab; Respiratory  Result Value Ref Range Status   Adenovirus NOT DETECTED NOT DETECTED Final   Coronavirus 229E DETECTED (A) NOT DETECTED Final    Comment: (NOTE) The Coronavirus on the Respiratory Panel, DOES NOT test for the novel  Coronavirus (2019 nCoV)    Coronavirus HKU1 NOT DETECTED NOT DETECTED Final   Coronavirus NL63 NOT DETECTED NOT DETECTED Final   Coronavirus OC43 NOT DETECTED NOT DETECTED Final   Metapneumovirus NOT DETECTED NOT DETECTED Final   Rhinovirus / Enterovirus DETECTED (A) NOT DETECTED Final   Influenza A NOT DETECTED NOT DETECTED Final   Influenza B NOT DETECTED NOT DETECTED Final   Parainfluenza Virus 1 NOT DETECTED NOT DETECTED Final   Parainfluenza Virus 2 NOT DETECTED NOT DETECTED Final   Parainfluenza Virus 3 NOT DETECTED NOT DETECTED Final   Parainfluenza Virus 4 NOT DETECTED NOT DETECTED Final    Respiratory Syncytial Virus NOT DETECTED NOT DETECTED Final   Bordetella pertussis NOT DETECTED NOT DETECTED Final   Bordetella Parapertussis NOT DETECTED NOT DETECTED Final   Chlamydophila pneumoniae NOT DETECTED NOT DETECTED Final   Mycoplasma pneumoniae NOT DETECTED NOT DETECTED Final    Comment: Performed at [facility], [address], [place], [place] [address]    Labs: CBC: Recent Labs  Lab 09/29/23 1933 10/01/23 0603  WBC 9.3 9.3  NEUTROABS 8.2*  --   HGB 11.6* 11.5*  HCT 37.8 37.6  MCV 92.9 94.5  PLT 258 220   Basic Metabolic Panel: Recent Labs  Lab 09/29/23 1933 10/01/23 0603  NA 135 138  K 4.2 4.0  CL 97* 101  CO2 29 28  GLUCOSE 108* 76  BUN 18 16  CREATININE 0.63 0.53  CALCIUM 8.5* 8.2*   Liver Function Tests: Recent Labs  Lab 10/01/23 0603  AST 14*  ALT 18  ALKPHOS 38  BILITOT 0.9  PROT 5.7*  ALBUMIN 3.1*   CBG: No results for input(s): "GLUCAP" in the last 168 hours.  Discharge time spent: approximately 45 minutes spent on discharge counseling, evaluation of patient on day of discharge, and coordination of discharge planning with nursing, social work, pharmacy and case management  Signed: Ephriam Hashimoto, MD [facility] 10/01/2023

## 2023-10-02 ENCOUNTER — Telehealth: Payer: Self-pay | Admitting: Pulmonary Disease

## 2023-10-02 MED ORDER — AZITHROMYCIN 250 MG PO TABS: 250.0000 mg | ORAL_TABLET | Freq: Every day | ORAL | 0 refills | Status: DC

## 2023-10-02 MED ORDER — PREDNISONE 10 MG PO TABS: 20.0000 mg | ORAL_TABLET | Freq: Every day | ORAL | 0 refills | Status: DC

## 2023-10-02 NOTE — Telephone Encounter (Signed)
 Pt had an echo done 2 days ago. Results are in EPIC.  Recommendations reviewed with pt as per Dr. Madireddy's note.  Pt verbalized understanding and had no additional questions.

## 2023-10-02 NOTE — Telephone Encounter (Signed)
 Patient has had multiple admissions for COPD exacerbation recently Due to see me on June 9 She is improving after her last admission and is on prednisone  taper I have instructed her to continue the taper until she reaches 20 mg of prednisone  and remain at that dose until follow-up in clinic Start azithromycin  to 250 mg daily to frequent exacerbations. Will initiate ohtuvayre  during office visit  She needs a new order for concentrator.  Please send DME order to Dempsey Finely MD [facility] See Amion for pager  If no response to pager , please call [phone] until 7pm After 7:00 pm call Elink  [phone] 10/02/2023, 3:54 PM

## 2023-10-03 NOTE — Telephone Encounter (Signed)
 ATC pt to verify need. Unable to leave vm due to mailbox not being setup.  Is her current concentrator broken?

## 2023-10-03 NOTE — Telephone Encounter (Signed)
Full VM 

## 2023-10-05 NOTE — Telephone Encounter (Signed)
 Spoke to patient. She stated that [employer] serviced her machine yesterday and it is now working.  Nothing further needed.

## 2023-10-06 NOTE — Telephone Encounter (Signed)
 Recommendations reviewed with pt as per Dr. Madireddy's note.  Pt verbalized understanding and had no additional questions.

## 2023-10-06 NOTE — Telephone Encounter (Signed)
Pt returning nurse call. Please advise.

## 2023-10-06 NOTE — Telephone Encounter (Signed)
Full VM 

## 2023-10-06 NOTE — Telephone Encounter (Signed)
 Patient returned RN's call.

## 2023-10-06 NOTE — Telephone Encounter (Signed)
 Left vm 5/23

## 2023-10-08 IMAGING — DX DG CHEST 2V
2 series · 2 of 2 positions shown · non-contrast
Comparison: 01/08/2020, chest CT 07/01/2020

CLINICAL DATA: 60-year-old female with dyspnea and COPD

EXAM:
CHEST - 2 VIEW

[chest pa]
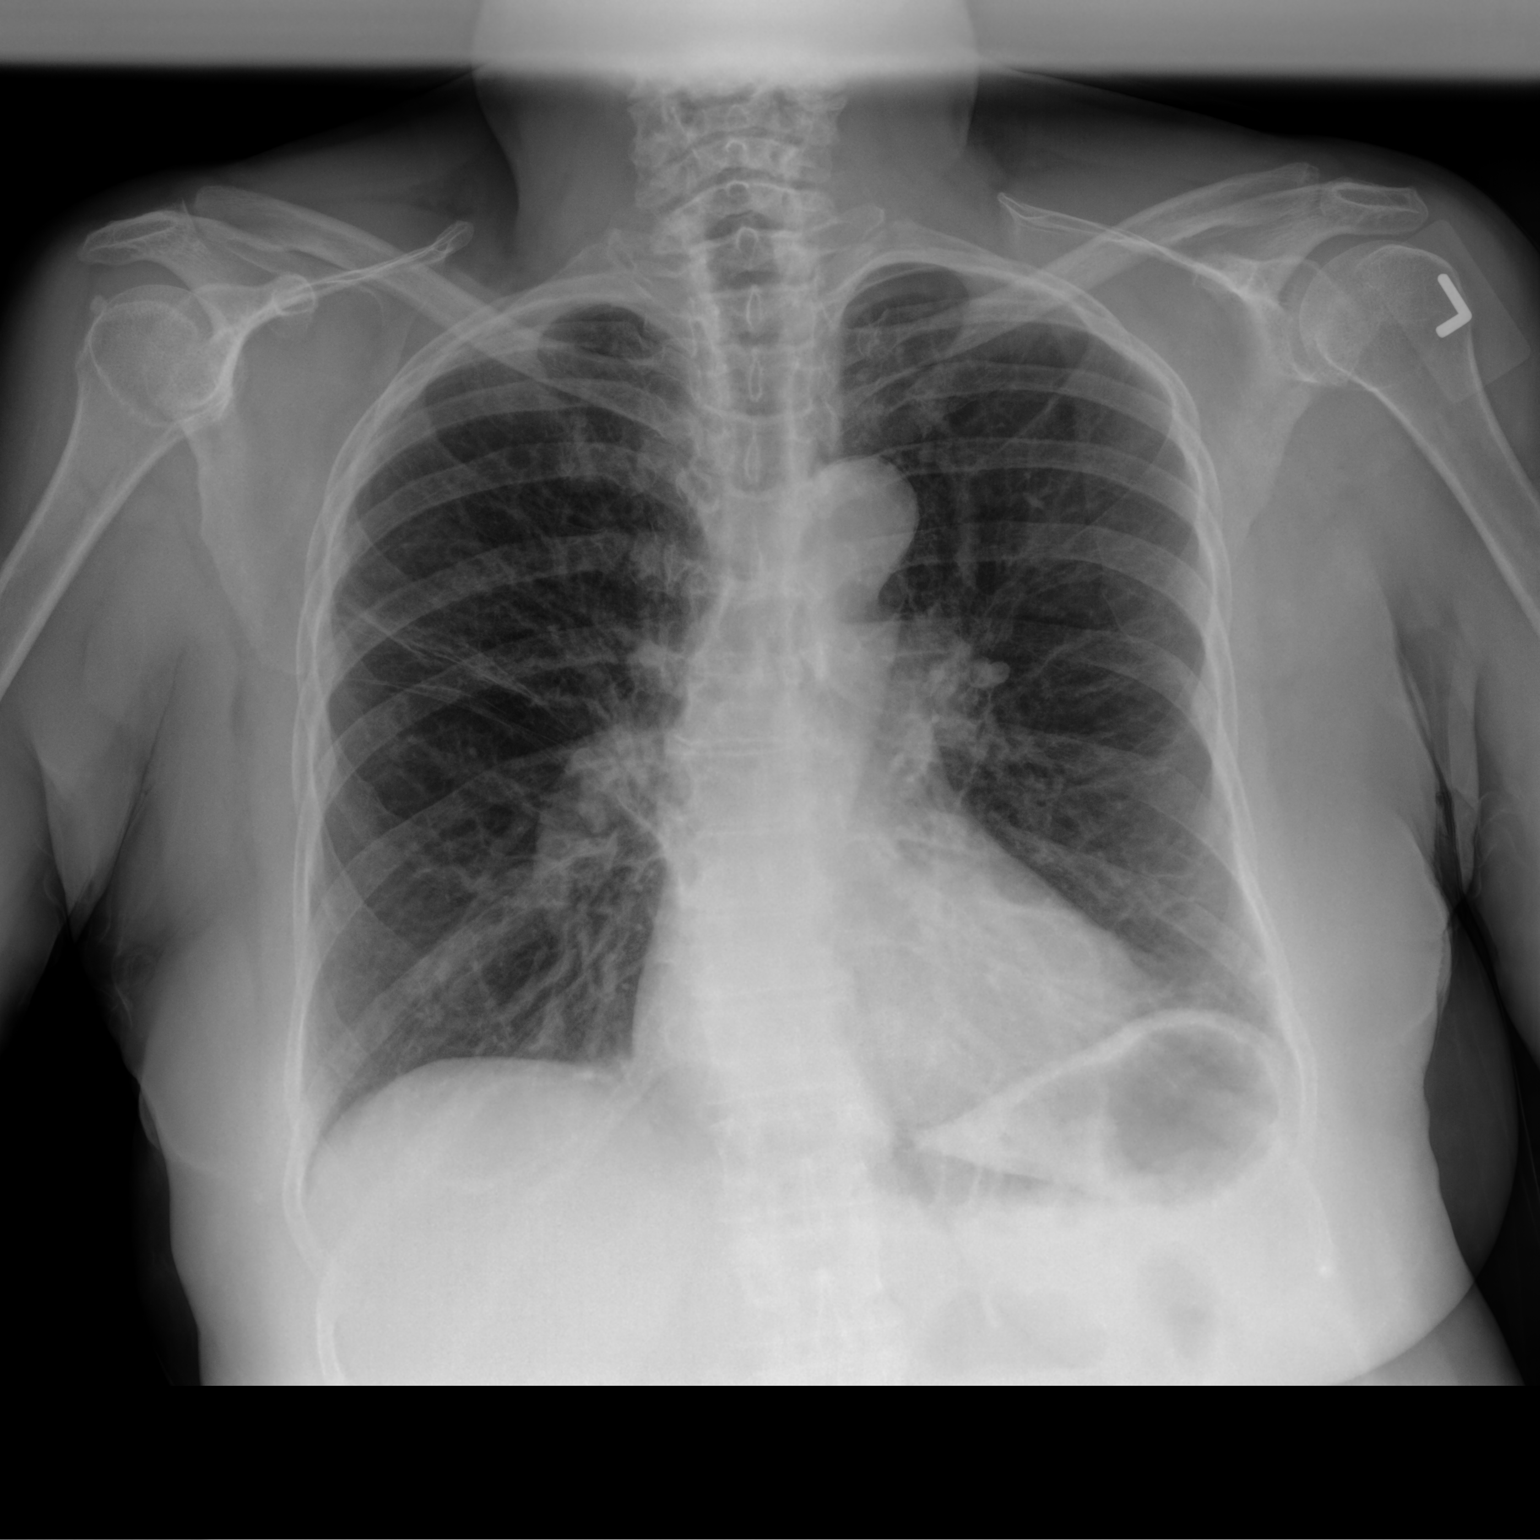

[chest lat]
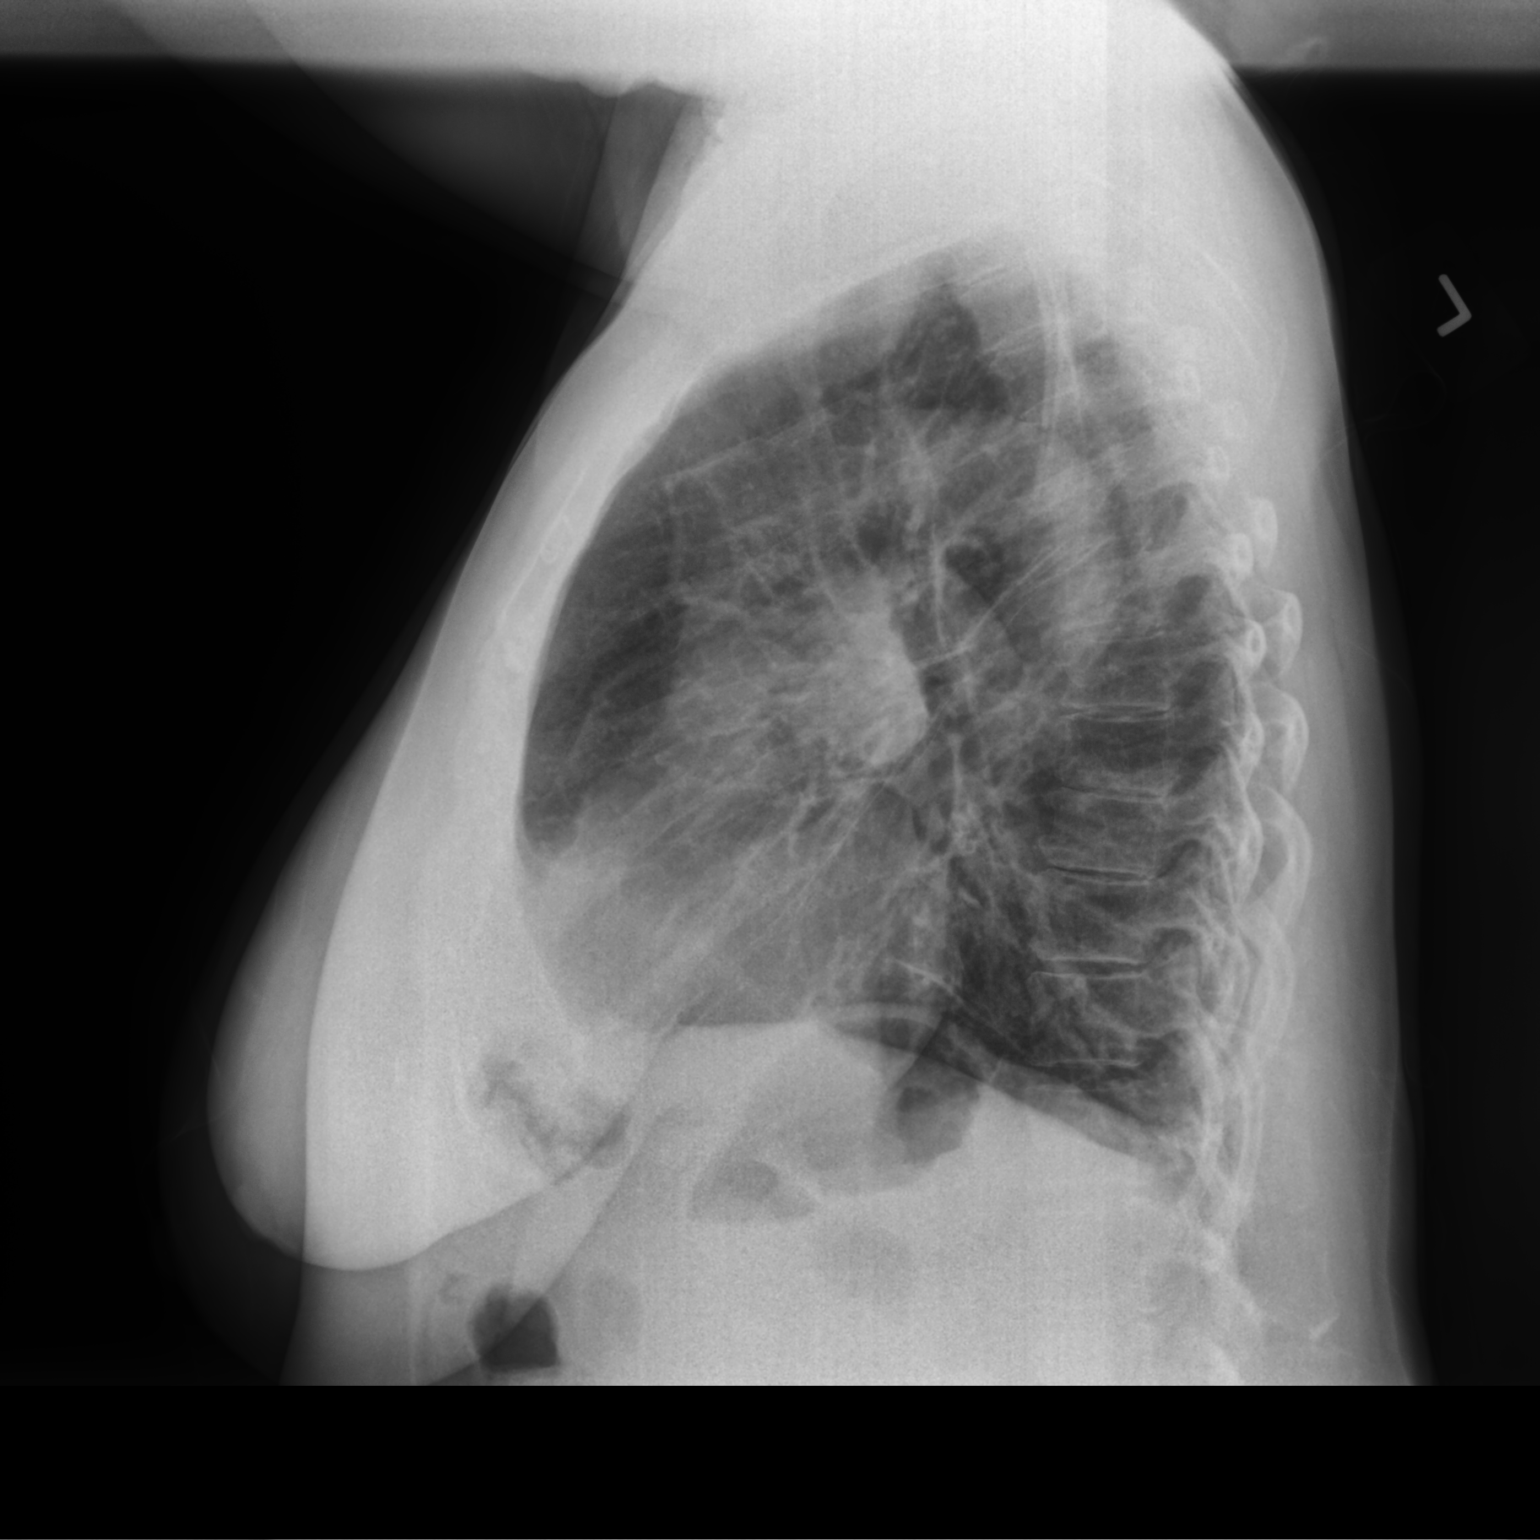

[2 of 2 positions shown; findings below may reference images not displayed]

FINDINGS: Cardiomediastinal silhouette unchanged in size and contour. No
evidence of central vascular congestion. No interlobular septal
thickening.

Unchanged appearance of linear opacities in the right mid lung.
Coarsened interstitial markings persist with no new confluent
airspace disease.

Stigmata of emphysema, with increased retrosternal airspace,
flattened hemidiaphragms, increased AP diameter, and hyperinflation
on the AP view.

No pneumothorax or pleural effusion.

No acute displaced fracture. Degenerative changes of the spine.
IMPRESSION: Chronic changes/emphysematous changes without evidence of acute
cardiopulmonary disease

## 2023-10-10 ENCOUNTER — Telehealth: Admitting: Obstetrics and Gynecology

## 2023-10-11 ENCOUNTER — Encounter (HOSPITAL_COMMUNITY): Payer: Self-pay

## 2023-10-11 ENCOUNTER — Emergency Department (HOSPITAL_COMMUNITY)

## 2023-10-11 ENCOUNTER — Emergency Department (HOSPITAL_COMMUNITY)
Admission: EM | Admit: 2023-10-11 | Discharge: 2023-10-11 | Disposition: A | Discharge status: Home / Self Care | Attending: Emergency Medicine | Admitting: Emergency Medicine

## 2023-10-11 ENCOUNTER — Other Ambulatory Visit: Payer: Self-pay

## 2023-10-11 DIAGNOSIS — I2510 Atherosclerotic heart disease of native coronary artery without angina pectoris: Secondary | ICD-10-CM | POA: Insufficient documentation

## 2023-10-11 DIAGNOSIS — Z8616 Personal history of COVID-19: Secondary | ICD-10-CM | POA: Insufficient documentation

## 2023-10-11 DIAGNOSIS — R0602 Shortness of breath: Secondary | ICD-10-CM | POA: Diagnosis present

## 2023-10-11 DIAGNOSIS — J441 Chronic obstructive pulmonary disease with (acute) exacerbation: Secondary | ICD-10-CM | POA: Diagnosis not present

## 2023-10-11 DIAGNOSIS — M7989 Other specified soft tissue disorders: Secondary | ICD-10-CM | POA: Diagnosis not present

## 2023-10-11 DIAGNOSIS — I5030 Unspecified diastolic (congestive) heart failure: Secondary | ICD-10-CM | POA: Insufficient documentation

## 2023-10-11 LAB — CBC
HCT: 38.8 % (ref 36.0–46.0)
Hemoglobin: 11.9 g/dL — ABNORMAL LOW (ref 12.0–15.0)
MCH: 27.9 pg (ref 26.0–34.0)
MCHC: 30.7 g/dL (ref 30.0–36.0)
MCV: 90.9 fL (ref 80.0–100.0)
Platelets: 228 10*3/uL (ref 150–400)
RBC: 4.27 MIL/uL (ref 3.87–5.11)
RDW: 14.6 % (ref 11.5–15.5)
WBC: 7.4 10*3/uL (ref 4.0–10.5)
nRBC: 0 % (ref 0.0–0.2)

## 2023-10-11 LAB — BLOOD GAS, VENOUS
Acid-base deficit: 12.3 mmol/L — ABNORMAL HIGH (ref 0.0–2.0)
Bicarbonate: 10.7 mmol/L — ABNORMAL LOW (ref 20.0–28.0)
O2 Saturation: 62.6 %
Patient temperature: 37
pCO2, Ven: 19 mmHg — CL (ref 44–60)
pH, Ven: 7.36 (ref 7.25–7.43)
pO2, Ven: 34 mmHg (ref 32–45)

## 2023-10-11 LAB — BASIC METABOLIC PANEL WITH GFR
Anion gap: 9 (ref 5–15)
BUN: 12 mg/dL (ref 8–23)
CO2: 29 mmol/L (ref 22–32)
Calcium: 8.9 mg/dL (ref 8.9–10.3)
Chloride: 91 mmol/L — ABNORMAL LOW (ref 98–111)
Creatinine, Ser: 0.64 mg/dL (ref 0.44–1.00)
GFR, Estimated: >60 mL/min (ref >60)
Glucose, Bld: 95 mg/dL (ref 70–99)
Potassium: 3.8 mmol/L (ref 3.5–5.1)
Sodium: 129 mmol/L — ABNORMAL LOW (ref 135–145)

## 2023-10-11 LAB — I-STAT CHEM 8, ED
BUN: 12 mg/dL (ref 8–23)
Calcium, Ion: 1.17 mmol/L (ref 1.15–1.40)
Chloride: 90 mmol/L — ABNORMAL LOW (ref 98–111)
Creatinine, Ser: 0.8 mg/dL (ref 0.44–1.00)
Glucose, Bld: 93 mg/dL (ref 70–99)
HCT: 38 % (ref 36.0–46.0)
Hemoglobin: 12.9 g/dL (ref 12.0–15.0)
Potassium: 3.8 mmol/L (ref 3.5–5.1)
Sodium: 129 mmol/L — ABNORMAL LOW (ref 135–145)
TCO2: 27 mmol/L (ref 22–32)

## 2023-10-11 LAB — TROPONIN I (HIGH SENSITIVITY)
Troponin I (High Sensitivity): 5 ng/L (ref <18)
Troponin I (High Sensitivity): 5 ng/L (ref <18)

## 2023-10-11 LAB — BRAIN NATRIURETIC PEPTIDE: B Natriuretic Peptide: 24.1 pg/mL (ref 0.0–100.0)

## 2023-10-11 MED ORDER — ALBUTEROL SULFATE (2.5 MG/3ML) 0.083% IN NEBU
15.0000 mg | INHALATION_SOLUTION | Freq: Once | RESPIRATORY_TRACT | Status: AC
  Administered 2023-10-11: 15 mg via RESPIRATORY_TRACT
  Filled 2023-10-11 (×2): qty 18

## 2023-10-11 MED ORDER — PREDNISONE 10 MG PO TABS: ORAL_TABLET | ORAL | 0 refills | Status: DC

## 2023-10-11 MED ORDER — METHYLPREDNISOLONE SODIUM SUCC 125 MG IJ SOLR
125.0000 mg | Freq: Once | INTRAMUSCULAR | Status: AC
  Administered 2023-10-11: 125 mg via INTRAVENOUS
  Filled 2023-10-11: qty 2

## 2023-10-11 MED ORDER — IPRATROPIUM BROMIDE 0.02 % IN SOLN
1.5000 mg | Freq: Once | RESPIRATORY_TRACT | Status: AC
  Administered 2023-10-11: 1.5 mg via RESPIRATORY_TRACT
  Filled 2023-10-11: qty 7.5

## 2023-10-11 MED ORDER — MAGNESIUM SULFATE 2 GM/50ML IV SOLN
2.0000 g | Freq: Once | INTRAVENOUS | Status: AC
  Administered 2023-10-11: 2 g via INTRAVENOUS
  Filled 2023-10-11: qty 50

## 2023-10-11 NOTE — Discharge Instructions (Addendum)
 I have increased your steroids and please take it as prescribed.  You have likely have COPD exacerbation.  You need to follow-up with your pulmonologist and discuss about steroid taper  Please continue to use your oxygen  and also use albuterol  every 4 hours as needed  Return to ER if you have worse chest pain or shortness of breath or trouble breathing or fever

## 2023-10-11 NOTE — ED Provider Notes (Signed)
 [place] EMERGENCY DEPARTMENT AT [facility] Provider Note   CSN: [id number] Arrival date & time: 10/11/23  1745     History  Chief Complaint  Patient presents with   Chest Pain    [name] is a 63 y.o. female history of COPD on 5 L nasal cannula, diastolic heart failure, CAD here presenting with chest pain and shortness of breath.  Patient was recently admitted for COPD exacerbation and also had rhinovirus and coronavirus.  Patient was put on a steroid taper.  Patient states that she is still on 20 mg of prednisone  right now.  She states that since this morning she noticed worsening shortness of breath.  She states that she took 1 nitro with minimal relief.  Denies any fevers or cough.  She also noticed some bilateral leg swelling   The history is provided by the patient.       Home Medications Prior to Admission medications   Medication Sig Start Date End Date Taking? Authorizing Provider  albuterol  (PROVENTIL ) (2.5 MG/3ML) 0.083% nebulizer solution INHALE 3 ML BY NEBULIZATION EVERY 6 HOURS AS NEEDED FOR WHEEZING OR SHORTNESS OF BREATH 10/02/23   Mannam, Praveen, MD  albuterol  (VENTOLIN  HFA) 108 (90 Base) MCG/ACT inhaler Inhale into the lungs every 6 (six) hours as needed for wheezing or shortness of breath.    [provider]  azithromycin  (ZITHROMAX ) 250 MG tablet Take 1 tablet (250 mg total) by mouth daily. 10/02/23 12/31/23  Mannam, Praveen, MD  budeson-glycopyrrolate -formoterol  (BREZTRI  AEROSPHERE) 160-9-4.8 MCG/ACT AERO inhaler Inhale 2 puffs into the lungs in the morning and at bedtime. 09/23/23   Lorita Rosa, MD  Evolocumab  (REPATHA  SURECLICK) 140 MG/ML SOAJ Inject 140 mg into the skin every 14 (fourteen) days. 09/06/23   Revankar, Micael Adas, MD  guaiFENesin  (MUCINEX ) 600 MG 12 hr tablet Take 1 tablet (600 mg total) by mouth 2 (two) times daily as needed for cough. Patient not taking: Reported on 09/29/2023 09/23/23   Lorita Rosa, MD   ipratropium-albuterol  (DUONEB) 0.5-2.5 (3) MG/3ML SOLN Take 3 mLs by nebulization every 6 (six) hours. 09/23/23   Lorita Rosa, MD  nitroGLYCERIN  (NITROSTAT ) 0.4 MG SL tablet Place 1 tablet (0.4 mg total) under the tongue every 5 (five) minutes as needed. 07/21/23 10/19/23  Revankar, Micael Adas, MD  omeprazole  (PRILOSEC) 20 MG capsule TAKE 1 CAPSULE BY MOUTH EVERY DAY 10/02/23   Mannam, Praveen, MD  OXYGEN  Inhale 4 L/min into the lungs See admin instructions. 5 L/min at bedtime and as needed for shortness of breath throughout the daytime    [provider]  predniSONE  (DELTASONE ) 10 MG tablet Take prednisone  40 mg (4 tabs) daily for 2 days then take prednisone  30 mg (3 tabs) daily for 1 day then take take prednisone  20 mg (1 tabs) daily for 1 day then take prednisone  10 mg (1 tab) daily for 1 day then stop 10/02/23   Danford, Willis Harter, MD  predniSONE  (DELTASONE ) 10 MG tablet Take 2 tablets (20 mg total) by mouth daily with breakfast. 10/02/23 12/31/23  Mannam, Praveen, MD      Allergies    Amoxicillin , Statins, Tylenol  [acetaminophen ], and Zetia  [ezetimibe ]    Review of Systems   Review of Systems  Respiratory:  Positive for shortness of breath.   Cardiovascular:  Positive for chest pain.  All other systems reviewed and are negative.   Physical Exam Updated Vital Signs BP (!) 149/102   Pulse 98   Resp (!) 38   SpO2 100%  Physical Exam Vitals and nursing note reviewed.  Constitutional:      Comments: Chronically ill and tachypneic  HENT:     Head: Normocephalic.  Eyes:     Extraocular Movements: Extraocular movements intact.     Pupils: Pupils are equal, round, and reactive to light.  Cardiovascular:     Rate and Rhythm: Normal rate and regular rhythm.     Heart sounds: Normal heart sounds.  Pulmonary:     Comments: Tachypneic and mild diffuse wheezing Abdominal:     General: Bowel sounds are normal.     Palpations: Abdomen is soft.  Musculoskeletal:     Cervical back:  Normal range of motion and neck supple.     Comments: 1+ edema  Skin:    General: Skin is warm.  Neurological:     General: No focal deficit present.     Mental Status: She is alert and oriented to person, place, and time.  Psychiatric:        Mood and Affect: Mood normal.        Behavior: Behavior normal.     ED Results / Procedures / Treatments   Labs (all labs ordered are listed, but only abnormal results are displayed) Labs Reviewed  I-STAT CHEM 8, ED - Abnormal; Notable for the following components:      Result Value   Sodium 129 (*)    Chloride 90 (*)    All other components within normal limits  BASIC METABOLIC PANEL WITH GFR  CBC  BRAIN NATRIURETIC PEPTIDE  BLOOD GAS, VENOUS  TROPONIN I (HIGH SENSITIVITY)    EKG EKG Interpretation Date/Time:  Wednesday Oct 11 2023 18:03:52 EDT Ventricular Rate:  91 PR Interval:  130 QRS Duration:  90 QT Interval:  369 QTC Calculation: 454 R Axis:   93  Text Interpretation: Sinus rhythm Right axis deviation ST elevation, consider inferior injury No significant change since last tracing Confirmed by Florette Hurry [phone]) on 10/11/2023 6:14:30 PM  Radiology No results found.  Procedures Procedures    CRITICAL CARE Performed by: Florette Hurry   Total critical care time: 45 minutes  Critical care time was exclusive of separately billable procedures and treating other patients.  Critical care was necessary to treat or prevent imminent or life-threatening deterioration.  Critical care was time spent personally by me on the following activities: development of treatment plan with patient and/or surrogate as well as nursing, discussions with consultants, evaluation of patient's response to treatment, examination of patient, obtaining history from patient or surrogate, ordering and performing treatments and interventions, ordering and review of laboratory studies, ordering and review of radiographic studies, pulse oximetry and  re-evaluation of patient's condition.   Medications Ordered in ED Medications  albuterol  (PROVENTIL ) (2.5 MG/3ML) 0.083% nebulizer solution 15 mg (has no administration in time range)  ipratropium (ATROVENT ) nebulizer solution 1.5 mg (has no administration in time range)  methylPREDNISolone  sodium succinate (SOLU-MEDROL ) 125 mg/2 mL injection 125 mg (has no administration in time range)  magnesium  sulfate IVPB 2 g 50 mL (has no administration in time range)    ED Course/ Medical Decision Making/ A&P                                 Medical Decision Making Zeriah Baysinger is a 63 y.o. female who presented with shortness of breath.  Patient has COPD and recent diagnosis of rhinovirus and coronavirus.  Concern for  possible COPD exacerbation versus heart failure.  Plan to get CBC and CMP and BNP and troponin and chest x-ray.  Will give continuous neb and magnesium  and Solu-Medrol  and reassess     9:43 PM I reviewed patient's labs and pH is normal at 7.36.  CO2 is low which is unusual for her.  Patient's sodium is 129.  Troponin negative x 2 and BNP is normal.  Chest x-ray showed COPD.  Patient has no wheezing after continuous neb and Solu-Medrol .  Will give another course of steroids.  Have her follow-up with her pulmonologist.  Problems Addressed: Chronic obstructive pulmonary disease with acute exacerbation (HCC): acute illness or injury  Amount and/or Complexity of Data Reviewed Labs: ordered. Decision-making details documented in ED Course. Radiology: ordered and independent interpretation performed. Decision-making details documented in ED Course.  Risk Prescription drug management.    Final Clinical Impression(s) / ED Diagnoses Final diagnoses:  None    Rx / DC Orders ED Discharge Orders     None         Dalene Duck, MD 10/11/23 2144

## 2023-10-11 NOTE — ED Triage Notes (Signed)
 Pt came in for chest pain for a couple hours that feels like its pressure built up. Pt stated she took her nitroglycerin  pill but nothing's changed. Pt presents with heavy work of breathing.

## 2023-10-11 NOTE — ED Notes (Signed)
 Urine sent to the lab.

## 2023-10-20 ENCOUNTER — Ambulatory Visit: Attending: Cardiology | Admitting: Cardiology

## 2023-10-20 ENCOUNTER — Encounter: Payer: Self-pay | Admitting: Cardiology

## 2023-10-20 VITALS — BP 112/66 | HR 106 | Ht 61.0 in | Wt 151.4 lb

## 2023-10-20 DIAGNOSIS — I700 Atherosclerosis of aorta: Secondary | ICD-10-CM | POA: Diagnosis not present

## 2023-10-20 DIAGNOSIS — I2510 Atherosclerotic heart disease of native coronary artery without angina pectoris: Secondary | ICD-10-CM

## 2023-10-20 DIAGNOSIS — E782 Mixed hyperlipidemia: Secondary | ICD-10-CM

## 2023-10-20 DIAGNOSIS — Z01812 Encounter for preprocedural laboratory examination: Secondary | ICD-10-CM

## 2023-10-20 DIAGNOSIS — I25119 Atherosclerotic heart disease of native coronary artery with unspecified angina pectoris: Secondary | ICD-10-CM

## 2023-10-20 DIAGNOSIS — I10 Essential (primary) hypertension: Secondary | ICD-10-CM

## 2023-10-20 DIAGNOSIS — I209 Angina pectoris, unspecified: Secondary | ICD-10-CM | POA: Insufficient documentation

## 2023-10-20 MED ORDER — METOPROLOL TARTRATE 100 MG PO TABS
ORAL_TABLET | ORAL | 0 refills | Status: DC
Start: 2023-10-20 — End: 2023-12-01
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
## 2023-10-20 NOTE — Patient Instructions (Addendum)
 Medication Instructions:  Your physician recommends that you continue on your current medications as directed. Please refer to the Current Medication list given to you today.  *If you need a refill on your cardiac medications before your next appointment, please call your pharmacy*  Lab Work: 3-7 days before CT scan: BMET If you have labs (blood work) drawn today and your tests are completely normal, you will receive your results only by: MyChart Message (if you have MyChart) OR A paper copy in the mail If you have any lab test that is abnormal or we need to change your treatment, we will call you to review the results.  Testing/Procedures:   Your cardiac CT will be scheduled at one of the below locations:   [facility] [address] [place], [place] [address] [phone]  OR   [facility] [address] [place], [place] [address] [phone]  OR   [facility] and [facility] Tower [address]  [place], [place] [address] Opening September 11, 2023  If scheduled at [facility], please arrive at the [facility] and Children's Entrance (Entrance C2) of [facility] 30 minutes prior to test start time. You can use the FREE valet parking offered at entrance C (encouraged to control the heart rate for the test)  Proceed to the [facility] Radiology Department (first floor) to check-in and test prep.   All radiology patients and guests should use entrance C2 at [facility], accessed from [facility], even though the hospital's physical address listed is [address]    If scheduled at the Heart and Vascular Tower at [address], please enter the parking lot using the [address] entrance and use the FREE valet service at the patient drop-off area. Enter the buidling and check-in with registration on the main floor.   If scheduled at [facility], please arrive 30 minutes early for  check-in and test prep.  Please follow these instructions carefully (unless otherwise directed):  An IV will be required for this test and Nitroglycerin  will be given.   On the Night Before the Test: Be sure to Drink plenty of water . Do not consume any caffeinated/decaffeinated beverages or chocolate 12 hours prior to your test. Do not take any antihistamines 12 hours prior to your test.  On the Day of the Test: Drink plenty of water  until 1 hour prior to the test. Do not eat any food 1 hour prior to test. You may take your regular medications prior to the test.  Take metoprolol  (Lopressor ) two hours prior to test. If you take Furosemide /Hydrochlorothiazide/Spironolactone/Chlorthalidone, please HOLD on the morning of the test. Patients who wear a continuous glucose monitor MUST remove the device prior to scanning. FEMALES- please wear underwire-free bra if available, avoid dresses & tight clothing       After the Test: Drink plenty of water . After receiving IV contrast, you may experience a mild flushed feeling. This is normal. On occasion, you may experience a mild rash up to 24 hours after the test. This is not dangerous. If this occurs, you can take Benadryl  25 mg, Zyrtec, Claritin , or Allegra and increase your fluid intake. (Patients taking Tikosyn should avoid Benadryl , and may take Zyrtec, Claritin , or Allegra) If you experience trouble breathing, this can be serious. If it is severe call 911 IMMEDIATELY. If it is mild, please call our office.  We will call to schedule your test 2-4 weeks out  understanding that some insurance companies will need an authorization prior to the service being performed.   For more information and frequently asked questions, please visit our website : [email or web address]  For non-scheduling related questions, please contact the cardiac imaging nurse navigator should you have any questions/concerns: Cardiac Imaging Nurse  Navigators Direct Office Dial: [phone]   For scheduling needs, including cancellations and rescheduling, please call [place], [phone].   Follow-Up: At [facility], you and your health needs are our priority.  As part of our continuing mission to provide you with exceptional heart care, our providers are all part of one team.  This team includes your primary Cardiologist (physician) and Advanced Practice Providers or APPs (Physician Assistants and Nurse Practitioners) who all work together to provide you with the care you need, when you need it.  Your next appointment:   9 month(s)  Provider:   Hillis Lu, MD

## 2023-10-23 ENCOUNTER — Ambulatory Visit: Admitting: Pulmonary Disease

## 2023-10-23 ENCOUNTER — Encounter: Payer: Self-pay | Admitting: Pulmonary Disease

## 2023-10-23 VITALS — BP 121/72 | HR 88 | Ht 63.0 in | Wt 153.0 lb

## 2023-10-23 DIAGNOSIS — F17210 Nicotine dependence, cigarettes, uncomplicated: Secondary | ICD-10-CM | POA: Diagnosis not present

## 2023-10-23 DIAGNOSIS — J449 Chronic obstructive pulmonary disease, unspecified: Secondary | ICD-10-CM

## 2023-10-23 DIAGNOSIS — J441 Chronic obstructive pulmonary disease with (acute) exacerbation: Secondary | ICD-10-CM | POA: Diagnosis not present

## 2023-10-23 DIAGNOSIS — F17200 Nicotine dependence, unspecified, uncomplicated: Secondary | ICD-10-CM

## 2023-10-23 MED ORDER — TRELEGY ELLIPTA 200-62.5-25 MCG/ACT IN AEPB: 1.0000 | INHALATION_SPRAY | Freq: Every day | RESPIRATORY_TRACT | 3 refills | Status: DC

## 2023-10-23 NOTE — Progress Notes (Unsigned)
 [name]    [id number]    Jul 19, 1960  Primary Care Physician:Osei-Bonsu, Caretha Chapel, MD  Referring Physician: Tretha Fu, MD [address],  [place] [address]  Chief complaint:  Follow-up for  Severe COPD  HPI: 63 year old smoker with very severe COPD, chronic hypoxic respiratory failure.  She is previously followed by Dr. Lyndal Sandy.  Pulmicort  and Perforomist  were ordered last year but insurance would not cover She was then placed on Symbicort  in 2018 but had worsening symptoms of dyspnea, wheeze. Inhalers were switched to Trelegy at office visit in March 2019.    Continues to have chronic cough, dyspnea on exertion, intermittent hemoptysis with mucus Plavix  has been held by Dr. Sandee Crook, cardiology  She was treated for COPD exacerbation around late November 2022.  CT at that time showed some inflammatory changes at the base of the lung Hospitalized for COPD exacerbation in mid February 2023 which was treated with steroids and bronchodilators Got Z-Pak and prednisone  in May 2023 for exacerbation She was seen again in clinic in early August 2023 for exacerbation and given another round of Z-Pak and prednisone   Admitted on 12/12/2022 with mild COPD exacerbation.  She was on BiPAP briefly.  Chest x-ray showed clear lungs.  Treated with IV Solu-Medrol , bronchodilators, Z-Pak with improvement and she is discharged in 2 days  Prescribed Daliresp  at last visit but could not tolerated due to insomnia and severe anxiety.  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 Exposures: No mold, hot tub, Jacuzzi. Smoking history: 35-pack-year smoker.  Quit in 2011 Travel history: No significant travel history Relevant family history: No significant family history of lung disease.  Interim  history: Discussed the use of AI scribe software for clinical note transcription with the patient, who gave verbal consent to proceed.  History of Present Illness [name] is a 63 year old female with COPD who presents for follow-up after a recent hospitalization for COPD exacerbation.  She was hospitalized for three days in April due to a COPD exacerbation associated with rhinovirus and coronavirus. During her hospital stay, she was treated with antibiotics and prednisone . Since discharge, she has been on 20 mg of prednisone  daily. She is not currently using any inhalers regularly, although she has tried Breztri  in the past but discontinued it due to discomfort. She has previously used Trelegy and is open to resuming it.  Her blood sugar levels have been elevated today, and she notes that she is prediabetic. She is currently using a nebulizer, oxygen , and takes omeprazole  and Repatha . She has experienced side effects from azithromycin , including stomach and side pain, and has stopped taking it. She also recalls experiencing anxiety and insomnia with Daliresp  in the past.  She is planning to retire from her job as she cannot manage without her oxygen  for extended periods. She expresses concern about the cost of certain medications, such as Advair and Tyvaso, which are not fully covered by her insurance.    Discussed the use of AI scribe software for clinical note transcription with the patient, who gave verbal consent to proceed.    The patient, with a history of COPD, presents following a recent hospitalization in end of Nov 2024 for a COPD exacerbation. She describes persistent breathing difficulties, despite some improvement since her hospital stay. She  reports that her breathing issues persist even after using her nebulizer. She was previously prescribed Daliresp , but discontinued it due to side effects including anxiety and insomnia. She is currently taking Trelegy and prednisone  daily.  She also mentions a spot on her lung that has been monitored, but does not provide further details.  The patient also discusses her inability to attend pulmonary rehab due to scheduling conflicts. She expresses a willingness to participate in group sessions, but the offered times conflict with her work schedule.   Outpatient Encounter Medications as of 10/23/2023  Medication Sig   albuterol  (PROVENTIL ) (2.5 MG/3ML) 0.083% nebulizer solution INHALE 3 ML BY NEBULIZATION EVERY 6 HOURS AS NEEDED FOR WHEEZING OR SHORTNESS OF BREATH   albuterol  (VENTOLIN  HFA) 108 (90 Base) MCG/ACT inhaler Inhale into the lungs every 6 (six) hours as needed for wheezing or shortness of breath.   azithromycin  (ZITHROMAX ) 250 MG tablet Take 1 tablet (250 mg total) by mouth daily.   budeson-glycopyrrolate -formoterol  (BREZTRI  AEROSPHERE) 160-9-4.8 MCG/ACT AERO inhaler Inhale 2 puffs into the lungs in the morning and at bedtime.   Evolocumab  (REPATHA  SURECLICK) 140 MG/ML SOAJ Inject 140 mg into the skin every 14 (fourteen) days.   guaiFENesin  (MUCINEX ) 600 MG 12 hr tablet Take 1 tablet (600 mg total) by mouth 2 (two) times daily as needed for cough.   ipratropium-albuterol  (DUONEB) 0.5-2.5 (3) MG/3ML SOLN Take 3 mLs by nebulization every 6 (six) hours.   metoprolol  tartrate (LOPRESSOR ) 100 MG tablet Take 2 hours prior to CT   omeprazole  (PRILOSEC) 20 MG capsule TAKE 1 CAPSULE BY MOUTH EVERY DAY   OXYGEN  Inhale 4 L/min into the lungs See admin instructions. 5 L/min at bedtime and as needed for shortness of breath throughout the daytime   predniSONE  (DELTASONE ) 10 MG tablet Take 2 tablets (20 mg total) by mouth daily with breakfast.   predniSONE  (DELTASONE ) 10 MG tablet Take prednisone  40 mg (4 tabs) daily for 2 days then take prednisone  30 mg (3 tabs) daily for 1 day then take take prednisone  20 mg (1 tabs) daily for 1 day then take prednisone  10 mg (1 tab) daily for 1 day then stop   nitroGLYCERIN  (NITROSTAT ) 0.4 MG SL tablet  Place 1 tablet (0.4 mg total) under the tongue every 5 (five) minutes as needed.   No facility-administered encounter medications on file as of 10/23/2023.   Physical Exam: Blood pressure 118/74, pulse 86, temperature 98.1 F (36.7 C), temperature source Oral, height 5\' 3"  (1.6 m), weight 160 lb 6.4 oz (72.8 kg), SpO2 96%. Gen:      No acute distress HEENT:  EOMI, sclera anicteric Neck:     No masses; no thyromegaly Lungs:    Mild exp wheeze CV:         Regular rate and rhythm; no murmurs Abd:      + bowel sounds; soft, non-tender; no palpable masses, no distension Ext:    No edema; adequate peripheral perfusion Skin:      Warm and dry; no rash Neuro: alert and oriented x 3 Psych: normal mood and affect   Data Reviewed: Imaging: See Low-dose screening CT 04/19/2021-new areas of bilateral subpleural groundglass consolidation in the lower lobes, coronary artery calcification, enlarged PA, emphysema.  Screening CT 07/15/21 - new large consolidation right middle and right lower lobe  CT 11/01/2021-irregular focus of consolidation in the right middle lobe  CT chest 05/26/2022-small consolidation in the medial right middle lobe unchanged since 09/28/2022, emphysema, chronic bronchitis  Chest x-ray  02/12/2023-increasing interstitial markings, bronchitic changes. I have reviewed the images personally.  PFT 06/02/17 FVC 1.50 [60%], FEV1 0.79 [40%], F/F 53, TLC 96, RV/TLC 167%, DLCO 32% Severe obstruction with bronchodilator response, air-trapping.  Severe diffusion impairment.  11/10/15:  Walked 360 meters / Baseline Sat 96% on RA / Nadir Sat 95% on RA  MICROBIOLOGY Sputum Ctx (11/13/15):  Paecilomyces species / Oral Flora / AFB negative    LABS 03/01/15 HIV:  Negative   09/29/14 ANA:  Negative   05/16/11 Alpha-1 antitrypsin: MM (122)  Cardiac work-up by Dr. Berry Bristol Nuclear stress test 11/14/16-very small inferoseptal ischemia, preserved LVEF with mild hypokinesis the same  region Echocardiogram shows mild pulmonary hypertension with normal LV systolic function.  Assessment:  Severe COPD with acute exacerbation, frequent exacerbations of COPD Chronic Obstructive Pulmonary Disease (COPD) Exacerbation Recent hospitalization for acute on chronic hypoxic and hypercarbic respiratory failure due to COPD exacerbation. Currently on Trelegy and Prednisone  10mg  daily. Still experiencing some wheezing and dyspnea despite nebulizer use. Daliresp  was not tolerated due to anxiety and insomnia.  -Increase Prednisone  to 20mg  daily for 5 days, then return to 10mg  daily. -Start Ohtuvayre  inhaler, pending insurance approval.  Consider chronic azithromycin  if this does not work as well. -Consider pulmonary rehabilitation, although scheduling conflicts have been a barrier.  Abnormal CT Continues to have waxing waning nodular opacities most recently seen on Nov 2024 Order follow-up CT in 6 months  Health maintenance Influenza vaccine-states that she [facility] flu [facility]. 09/03/2011-Pneumovax  Plan/Recommendations: Continue Trelegy Increase prednisone  Ohtuvayre  inhaler, Follow-up CT  Phyllis Breeze MD [facility] 10/23/2023, 3:32 PM  CC: Tretha Fu, MD

## 2023-10-23 NOTE — Progress Notes (Deleted)
 [name]    [id number]    07/15/1960  Primary Care Physician:Osei-Bonsu, Caretha Chapel, MD  Referring Physician: Tretha Fu, MD [address] [place],  [place] [address]  Chief complaint:  Follow-up for  Severe COPD  HPI: 63 year old smoker with very severe COPD, chronic hypoxic respiratory failure.  She is previously followed by Dr. Lyndal Sandy.  Pulmicort  and Perforomist  were ordered last year but insurance would not cover She was then placed on Symbicort  in 2018 but had worsening symptoms of dyspnea, wheeze. Inhalers were switched to Trelegy at office visit in March 2019.    Continues to have chronic cough, dyspnea on exertion, intermittent hemoptysis with mucus Plavix  has been held by Dr. Sandee Crook, cardiology  She was treated for COPD exacerbation around late November 2022.  CT at that time showed some inflammatory changes at the base of the lung Hospitalized for COPD exacerbation in mid February 2023 which was treated with steroids and bronchodilators Got Z-Pak and prednisone  in May 2023 for exacerbation She was seen again in clinic in early August 2023 for exacerbation and given another round of Z-Pak and prednisone   Admitted on 12/12/2022 with mild COPD exacerbation.  She was on BiPAP briefly.  Chest x-ray showed clear lungs.  Treated with IV Solu-Medrol , bronchodilators, Z-Pak with improvement and she is discharged in 2 days  Prescribed Daliresp  in 2024 but could not tolerated due to insomnia and severe anxiety.  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 Exposures: No mold, hot tub, Jacuzzi. Smoking history: 35-pack-year smoker.  Quit in 2011 Travel history: No significant travel history Relevant family history: No significant family history of lung disease.  Interim  history: Discussed the use of AI scribe software for clinical note transcription with the patient, who gave verbal consent to proceed.    The patient, with a history of COPD, presents following a recent hospitalization in end of Nov 2024 for a COPD exacerbation. She describes persistent breathing difficulties, despite some improvement since her hospital stay. She reports that her breathing issues persist even after using her nebulizer. She was previously prescribed Daliresp , but discontinued it due to side effects including anxiety and insomnia. She is currently taking Trelegy and prednisone  daily. She also mentions a spot on her lung that has been monitored, but does not provide further details.  The patient also discusses her inability to attend pulmonary rehab due to scheduling conflicts. She expresses a willingness to participate in group sessions, but the offered times conflict with her work schedule.   Outpatient Encounter Medications as of 10/23/2023  Medication Sig   albuterol  (PROVENTIL ) (2.5 MG/3ML) 0.083% nebulizer solution INHALE 3 ML BY NEBULIZATION EVERY 6 HOURS AS NEEDED FOR WHEEZING OR SHORTNESS OF BREATH   albuterol  (VENTOLIN  HFA) 108 (90 Base) MCG/ACT inhaler Inhale into the lungs every 6 (six) hours as needed for wheezing or shortness of breath.   azithromycin  (ZITHROMAX ) 250 MG tablet Take 1 tablet (250 mg total) by mouth daily.   budeson-glycopyrrolate -formoterol  (BREZTRI  AEROSPHERE) 160-9-4.8 MCG/ACT AERO inhaler Inhale 2 puffs into the lungs in the morning and at bedtime.   Evolocumab  (REPATHA  SURECLICK) 140 MG/ML SOAJ Inject 140 mg into the skin every 14 (fourteen) days.   guaiFENesin  (MUCINEX ) 600 MG 12 hr tablet Take 1 tablet (600 mg total) by mouth 2 (two)  times daily as needed for cough.   ipratropium-albuterol  (DUONEB) 0.5-2.5 (3) MG/3ML SOLN Take 3 mLs by nebulization every 6 (six) hours.   metoprolol  tartrate (LOPRESSOR ) 100 MG tablet Take 2 hours prior to CT    omeprazole  (PRILOSEC) 20 MG capsule TAKE 1 CAPSULE BY MOUTH EVERY DAY   OXYGEN  Inhale 4 L/min into the lungs See admin instructions. 5 L/min at bedtime and as needed for shortness of breath throughout the daytime   predniSONE  (DELTASONE ) 10 MG tablet Take 2 tablets (20 mg total) by mouth daily with breakfast.   predniSONE  (DELTASONE ) 10 MG tablet Take prednisone  40 mg (4 tabs) daily for 2 days then take prednisone  30 mg (3 tabs) daily for 1 day then take take prednisone  20 mg (1 tabs) daily for 1 day then take prednisone  10 mg (1 tab) daily for 1 day then stop   nitroGLYCERIN  (NITROSTAT ) 0.4 MG SL tablet Place 1 tablet (0.4 mg total) under the tongue every 5 (five) minutes as needed.   No facility-administered encounter medications on file as of 10/23/2023.   Physical Exam: Blood pressure 118/74, pulse 86, temperature 98.1 F (36.7 C), temperature source Oral, height 5\' 3"  (1.6 m), weight 160 lb 6.4 oz (72.8 kg), SpO2 96%. Gen:      No acute distress HEENT:  EOMI, sclera anicteric Neck:     No masses; no thyromegaly Lungs:    Mild exp wheeze CV:         Regular rate and rhythm; no murmurs Abd:      + bowel sounds; soft, non-tender; no palpable masses, no distension Ext:    No edema; adequate peripheral perfusion Skin:      Warm and dry; no rash Neuro: alert and oriented x 3 Psych: normal mood and affect   Data Reviewed: Imaging: See Low-dose screening CT 04/19/2021-new areas of bilateral subpleural groundglass consolidation in the lower lobes, coronary artery calcification, enlarged PA, emphysema.  Screening CT 07/15/21 - new large consolidation right middle and right lower lobe  CT 11/01/2021-irregular focus of consolidation in the right middle lobe  CT chest 05/26/2022-small consolidation in the medial right middle lobe unchanged since 09/28/2022, emphysema, chronic bronchitis  Chest x-ray 02/12/2023-increasing interstitial markings, bronchitic changes. I have reviewed the images  personally.  PFT 06/02/17 FVC 1.50 [60%], FEV1 0.79 [40%], F/F 53, TLC 96, RV/TLC 167%, DLCO 32% Severe obstruction with bronchodilator response, air-trapping.  Severe diffusion impairment.  11/10/15:  Walked 360 meters / Baseline Sat 96% on RA / Nadir Sat 95% on RA  MICROBIOLOGY Sputum Ctx (11/13/15):  Paecilomyces species / Oral Flora / AFB negative    LABS 03/01/15 HIV:  Negative   09/29/14 ANA:  Negative   05/16/11 Alpha-1 antitrypsin: MM (122)  Cardiac work-up by Dr. Berry Bristol Nuclear stress test 11/14/16-very small inferoseptal ischemia, preserved LVEF with mild hypokinesis the same region Echocardiogram shows mild pulmonary hypertension with normal LV systolic function.  Assessment:  Severe COPD with acute exacerbation, frequent exacerbations of COPD Chronic Obstructive Pulmonary Disease (COPD) Exacerbation Recent hospitalization for acute on chronic hypoxic and hypercarbic respiratory failure due to COPD exacerbation. Currently on Trelegy and Prednisone  10mg  daily. Still experiencing some wheezing and dyspnea despite nebulizer use. Daliresp  was not tolerated due to anxiety and insomnia.  -Increase Prednisone  to 20mg  daily for 5 days, then return to 10mg  daily. -Start Ohtuvayre  inhaler, pending insurance approval.  Consider chronic azithromycin  if this does not work as well. -Consider pulmonary rehabilitation, although scheduling conflicts have been a barrier.  Abnormal CT Continues to have waxing waning nodular opacities most recently seen on Nov 2024 Order follow-up CT in 6 months  Health maintenance Influenza vaccine-states that she [facility] flu [facility]. 09/03/2011-Pneumovax  Plan/Recommendations: Continue Trelegy Increase prednisone  Ohtuvayre  inhaler, Follow-up CT  Phyllis Breeze MD [facility] 10/23/2023, 3:34 PM  CC: Tretha Fu, MD

## 2023-10-23 NOTE — Patient Instructions (Signed)
 VISIT SUMMARY:  Today, we discussed your recent hospitalization for a COPD exacerbation and reviewed your current treatment plan. We addressed your concerns about medication side effects and costs, and we made adjustments to your treatment to help manage your COPD more effectively.  YOUR PLAN:  -COPD EXACERBATION: COPD exacerbation means a worsening of your chronic obstructive pulmonary disease symptoms. You were recently hospitalized for this condition. We will continue your prednisone  at 20 mg daily and prescribe Trelegy inhaler, which you have tolerated well in the past. We are also initiating pulmonary rehab to help prevent future exacerbations and submitting paperwork for Dupixent  injections, pending insurance approval.  INSTRUCTIONS:  Please continue taking prednisone  20 mg daily and start using the Trelegy inhaler as prescribed. Attend pulmonary rehab sessions as recommended. We will notify you once the Dupixent  injection is approved by your insurance.

## 2023-10-25 ENCOUNTER — Telehealth: Payer: Self-pay

## 2023-10-25 ENCOUNTER — Other Ambulatory Visit (HOSPITAL_COMMUNITY): Payer: Self-pay

## 2023-10-25 NOTE — Telephone Encounter (Signed)
 Received New start paperwork for DUPIXENT . Will update as we work through the benefits process.  Submitted a Prior Authorization request to [facility] for DUPIXENT  via CoverMyMeds. Will update once we receive a response.  Key: [id number]

## 2023-10-26 ENCOUNTER — Telehealth (HOSPITAL_COMMUNITY): Payer: Self-pay

## 2023-10-26 NOTE — Telephone Encounter (Signed)
 Called patient in regards to interest in pulmonary rehab, went over program. Patient stated she is unable to attend due to her work schedule as she gets off at 2:30pm. Informed patient if she changes her mind she can call us  back to reopen.  Closing referral.

## 2023-10-27 ENCOUNTER — Ambulatory Visit: Admitting: Pulmonary Disease

## 2023-10-27 ENCOUNTER — Other Ambulatory Visit (HOSPITAL_BASED_OUTPATIENT_CLINIC_OR_DEPARTMENT_OTHER): Payer: Self-pay

## 2023-10-28 LAB — BASIC METABOLIC PANEL WITH GFR
BUN/Creatinine Ratio: 12 (ref 12–28)
BUN: 8 mg/dL (ref 8–27)
CO2: 25 mmol/L (ref 20–29)
Calcium: 9.3 mg/dL (ref 8.7–10.3)
Chloride: 101 mmol/L (ref 96–106)
Creatinine, Ser: 0.65 mg/dL (ref 0.57–1.00)
Glucose: 79 mg/dL (ref 70–99)
Potassium: 4.4 mmol/L (ref 3.5–5.2)
Sodium: 142 mmol/L (ref 134–144)
eGFR: 98 mL/min/{1.73_m2} (ref >59)

## 2023-10-30 ENCOUNTER — Encounter (HOSPITAL_COMMUNITY): Payer: Self-pay

## 2023-10-31 ENCOUNTER — Ambulatory Visit: Payer: Self-pay | Admitting: Cardiology

## 2023-10-31 ENCOUNTER — Telehealth (HOSPITAL_COMMUNITY): Payer: Self-pay | Admitting: Emergency Medicine

## 2023-10-31 DIAGNOSIS — E782 Mixed hyperlipidemia: Secondary | ICD-10-CM

## 2023-10-31 DIAGNOSIS — I2510 Atherosclerotic heart disease of native coronary artery without angina pectoris: Secondary | ICD-10-CM

## 2023-10-31 NOTE — Telephone Encounter (Signed)
 Reaching out to patient to offer assistance regarding upcoming cardiac imaging study; pt verbalizes understanding of appt date/time, parking situation and where to check in, pre-test NPO status and medications ordered, and verified current allergies; name and call back number provided for further questions should they arise Rockwell Alexandria RN Navigator Cardiac Imaging [facility] [phone] office [phone] cell

## 2023-11-01 ENCOUNTER — Ambulatory Visit (HOSPITAL_COMMUNITY)
Admission: RE | Admit: 2023-11-01 | Discharge: 2023-11-01 | Disposition: A | Discharge status: Home / Self Care | Source: Ambulatory Visit | Attending: Cardiology | Admitting: Cardiology

## 2023-11-01 ENCOUNTER — Other Ambulatory Visit (HOSPITAL_BASED_OUTPATIENT_CLINIC_OR_DEPARTMENT_OTHER): Payer: Self-pay

## 2023-11-01 DIAGNOSIS — I517 Cardiomegaly: Secondary | ICD-10-CM | POA: Insufficient documentation

## 2023-11-01 DIAGNOSIS — I25119 Atherosclerotic heart disease of native coronary artery with unspecified angina pectoris: Secondary | ICD-10-CM | POA: Diagnosis not present

## 2023-11-01 DIAGNOSIS — I209 Angina pectoris, unspecified: Secondary | ICD-10-CM

## 2023-11-01 MED ORDER — NITROGLYCERIN 0.4 MG SL SUBL
0.8000 mg | SUBLINGUAL_TABLET | Freq: Once | SUBLINGUAL | Status: AC
  Administered 2023-11-01: 0.8 mg via SUBLINGUAL

## 2023-11-01 MED ORDER — IOHEXOL 350 MG/ML SOLN
100.0000 mL | Freq: Once | INTRAVENOUS | Status: AC | PRN
  Administered 2023-11-01: 100 mL via INTRAVENOUS

## 2023-11-01 NOTE — Telephone Encounter (Signed)
Viewed in MyChart Routed to PCP  

## 2023-11-03 ENCOUNTER — Other Ambulatory Visit

## 2023-11-03 ENCOUNTER — Telehealth: Payer: Self-pay | Admitting: Cardiology

## 2023-11-03 MED ORDER — FUROSEMIDE 20 MG PO TABS: 20.0000 mg | ORAL_TABLET | Freq: Every day | ORAL | 0 refills | Status: DC

## 2023-11-03 NOTE — Telephone Encounter (Addendum)
 Patient identification verified by 2 forms. [name], RN     Called and spoke to patient  Patient states:  - swelling in right ankle since yesterday.  - She tried to elevate the leg lastnight but this did not help - she weighs herself and has not has weight gain of 3lbs overnight or 5lbs in week.    Patient denies:   - chest pain, dizziness, SOB, headache, cough             Interventions/Plan:  - Reviewed encounter with DOD [name]). Per DOD prescription for 20 mg once daily for three days sent to pharmacy.  - Patient notified of recommendations.     Patient agrees with plan, no questions at this time

## 2023-11-03 NOTE — Telephone Encounter (Signed)
 Pt c/o swelling/edema: STAT if pt has developed SOB within 24 hours  If swelling, where is the swelling located? Right leg   How much weight have you gained and in what time span? No   Have you gained 2 pounds in a day or 5 pounds in a week? Nothing gained   Do you have a log of your daily weights (if so, list)? No   Are you currently taking a fluid pill? No   Are you currently SOB? No   Have you traveled recently in a car or plane for an extended period of time? No   She asked if Dr. Lafayette Pierre can call her in [facility]  to [facility] on [address].

## 2023-11-08 NOTE — Addendum Note (Signed)
 Addended by: ONEITA BERLINER on: 11/08/2023 09:09 AM   Modules accepted: Orders

## 2023-11-08 NOTE — Telephone Encounter (Signed)
-----   Message from Jennifer SAUNDERS Revankar sent at 11/02/2023  2:08 PM EDT ----- Please get patient in for Chem-7 liver lipid check.  Calcium score is elevated.  Copy primary care. Jennifer SAUNDERS Crape, MD 11/02/2023 2:06 PM  ----- Message ----- From: Interface, Rad Results In Sent: 11/01/2023   5:17 PM EDT To: Jennifer SAUNDERS Crape, MD

## 2023-11-09 NOTE — Telephone Encounter (Signed)
Viewed in MyChart Routed to PCP  

## 2023-11-10 ENCOUNTER — Ambulatory Visit
Admission: RE | Admit: 2023-11-10 | Discharge: 2023-11-10 | Disposition: A | Discharge status: Home / Self Care | Source: Ambulatory Visit | Attending: Pulmonary Disease | Admitting: Pulmonary Disease

## 2023-11-10 DIAGNOSIS — J181 Lobar pneumonia, unspecified organism: Secondary | ICD-10-CM

## 2023-11-10 DIAGNOSIS — R911 Solitary pulmonary nodule: Secondary | ICD-10-CM

## 2023-11-10 NOTE — Telephone Encounter (Addendum)
 Submitted a Prior Authorization request to [facility] for DUPIXENT  via CoverMyMeds. Will update once we receive a response.  Key: [id number]   Submitted a Prior Authorization request to [facility] for DUPIXENT  via CoverMyMeds. Will update once we receive a response.  Key: [facility]

## 2023-11-13 ENCOUNTER — Telehealth: Payer: Self-pay | Admitting: Cardiology

## 2023-11-13 NOTE — Telephone Encounter (Signed)
 Patient called to follow-up on test results and stated she was being referred for further testing and wants a call back to clarify next steps.

## 2023-11-13 NOTE — Telephone Encounter (Signed)
Results reviewed with pt as per Dr. Revankar's note.  Pt verbalized understanding and had no additional questions. Routed to PCP.  

## 2023-11-15 ENCOUNTER — Ambulatory Visit (HOSPITAL_BASED_OUTPATIENT_CLINIC_OR_DEPARTMENT_OTHER)
Admission: RE | Admit: 2023-11-15 | Discharge: 2023-11-15 | Disposition: A | Discharge status: Home / Self Care | Source: Ambulatory Visit | Attending: Physician Assistant | Admitting: Physician Assistant

## 2023-11-15 DIAGNOSIS — M8588 Other specified disorders of bone density and structure, other site: Secondary | ICD-10-CM | POA: Insufficient documentation

## 2023-11-15 DIAGNOSIS — Z7952 Long term (current) use of systemic steroids: Secondary | ICD-10-CM | POA: Insufficient documentation

## 2023-11-15 DIAGNOSIS — Z13820 Encounter for screening for osteoporosis: Secondary | ICD-10-CM | POA: Diagnosis not present

## 2023-11-16 LAB — HEPATIC FUNCTION PANEL
ALT: 14 IU/L (ref 0–32)
AST: 14 IU/L (ref 0–40)
Albumin: 4.2 g/dL (ref 3.9–4.9)
Alkaline Phosphatase: 57 IU/L (ref 44–121)
Bilirubin Total: 0.3 mg/dL (ref 0.0–1.2)
Bilirubin, Direct: 0.13 mg/dL (ref 0.00–0.40)
Total Protein: 6.1 g/dL (ref 6.0–8.5)

## 2023-11-16 LAB — LIPID PANEL
Chol/HDL Ratio: 1.6 ratio (ref 0.0–4.4)
Cholesterol, Total: 172 mg/dL (ref 100–199)
HDL: 107 mg/dL (ref >39)
LDL Chol Calc (NIH): 53 mg/dL (ref 0–99)
Triglycerides: 58 mg/dL (ref 0–149)
VLDL Cholesterol Cal: 12 mg/dL (ref 5–40)

## 2023-11-21 ENCOUNTER — Other Ambulatory Visit (HOSPITAL_COMMUNITY): Payer: Self-pay

## 2023-11-21 NOTE — Telephone Encounter (Signed)
 Copied from CRM [id number]. Topic: Clinical - Lab/Test Results >> Nov 21, 2023  9:02 AM Celestine FALCON wrote: Reason for CRM: Pt is requesting the results from her recent CT chest wo contrast ordered by Dr. Theophilus completed on 11/10/2023. Pt is requesting a call back at [phone] text no voicemail.  CT scan results can take up to 2 weeks before we receive results. However, the radiology report is in. Dr Theophilus, please advise CT results.

## 2023-11-21 NOTE — Telephone Encounter (Signed)
 CT shows emphysema and mild chronic changes of scarring in the lung that is not very concerning.

## 2023-11-21 NOTE — Telephone Encounter (Signed)
 Received notification from [facility] regarding a prior authorization for DUPIXENT . Authorization has been APPROVED from 10/27/2023 to 05/08/2024. Approval letter sent to scan center.  Authorization # [id number]    Submitted a Prior Authorization request to [facility] for DUPIXENT  via CoverMyMeds. Will update once we receive a response.  Key: [id number]  Sherry Pennant, PharmD, MPH, BCPS, CPP Clinical Pharmacist (Rheumatology and Pulmonology)

## 2023-11-22 NOTE — Telephone Encounter (Signed)
 I called and spoke with the pt and notified of results per Dr. Theophilus. Pt verbalized understanding. Nothing further needed.

## 2023-11-24 ENCOUNTER — Other Ambulatory Visit (HOSPITAL_BASED_OUTPATIENT_CLINIC_OR_DEPARTMENT_OTHER): Payer: Self-pay

## 2023-11-29 ENCOUNTER — Emergency Department (HOSPITAL_COMMUNITY)

## 2023-11-29 ENCOUNTER — Other Ambulatory Visit: Payer: Self-pay

## 2023-11-29 ENCOUNTER — Inpatient Hospital Stay (HOSPITAL_COMMUNITY)
Admission: EM | Admit: 2023-11-29 | Discharge: 2023-12-01 | DRG: 189 | Disposition: A | Discharge status: Home / Self Care | Attending: Internal Medicine | Admitting: Internal Medicine

## 2023-11-29 ENCOUNTER — Other Ambulatory Visit: Payer: Self-pay | Admitting: Pulmonary Disease

## 2023-11-29 ENCOUNTER — Encounter (HOSPITAL_COMMUNITY): Payer: Self-pay | Admitting: Family Medicine

## 2023-11-29 DIAGNOSIS — E871 Hypo-osmolality and hyponatremia: Secondary | ICD-10-CM | POA: Diagnosis present

## 2023-11-29 DIAGNOSIS — J439 Emphysema, unspecified: Secondary | ICD-10-CM | POA: Diagnosis present

## 2023-11-29 DIAGNOSIS — Z1152 Encounter for screening for COVID-19: Secondary | ICD-10-CM

## 2023-11-29 DIAGNOSIS — I2510 Atherosclerotic heart disease of native coronary artery without angina pectoris: Secondary | ICD-10-CM | POA: Diagnosis present

## 2023-11-29 DIAGNOSIS — J441 Chronic obstructive pulmonary disease with (acute) exacerbation: Secondary | ICD-10-CM | POA: Diagnosis present

## 2023-11-29 DIAGNOSIS — J440 Chronic obstructive pulmonary disease with acute lower respiratory infection: Secondary | ICD-10-CM | POA: Diagnosis present

## 2023-11-29 DIAGNOSIS — E785 Hyperlipidemia, unspecified: Secondary | ICD-10-CM | POA: Diagnosis present

## 2023-11-29 DIAGNOSIS — Z825 Family history of asthma and other chronic lower respiratory diseases: Secondary | ICD-10-CM

## 2023-11-29 DIAGNOSIS — I10 Essential (primary) hypertension: Secondary | ICD-10-CM | POA: Diagnosis present

## 2023-11-29 DIAGNOSIS — J9621 Acute and chronic respiratory failure with hypoxia: Principal | ICD-10-CM | POA: Diagnosis present

## 2023-11-29 DIAGNOSIS — D638 Anemia in other chronic diseases classified elsewhere: Secondary | ICD-10-CM | POA: Diagnosis present

## 2023-11-29 DIAGNOSIS — Z886 Allergy status to analgesic agent status: Secondary | ICD-10-CM | POA: Diagnosis not present

## 2023-11-29 DIAGNOSIS — Z87891 Personal history of nicotine dependence: Secondary | ICD-10-CM | POA: Diagnosis not present

## 2023-11-29 DIAGNOSIS — K219 Gastro-esophageal reflux disease without esophagitis: Secondary | ICD-10-CM | POA: Diagnosis present

## 2023-11-29 DIAGNOSIS — Z8249 Family history of ischemic heart disease and other diseases of the circulatory system: Secondary | ICD-10-CM

## 2023-11-29 DIAGNOSIS — K5900 Constipation, unspecified: Secondary | ICD-10-CM | POA: Diagnosis present

## 2023-11-29 DIAGNOSIS — I429 Cardiomyopathy, unspecified: Secondary | ICD-10-CM | POA: Diagnosis present

## 2023-11-29 DIAGNOSIS — Z79899 Other long term (current) drug therapy: Secondary | ICD-10-CM

## 2023-11-29 DIAGNOSIS — Z880 Allergy status to penicillin: Secondary | ICD-10-CM

## 2023-11-29 DIAGNOSIS — Z7984 Long term (current) use of oral hypoglycemic drugs: Secondary | ICD-10-CM | POA: Diagnosis not present

## 2023-11-29 DIAGNOSIS — Z8616 Personal history of COVID-19: Secondary | ICD-10-CM | POA: Diagnosis not present

## 2023-11-29 DIAGNOSIS — Z9981 Dependence on supplemental oxygen: Secondary | ICD-10-CM

## 2023-11-29 DIAGNOSIS — Z888 Allergy status to other drugs, medicaments and biological substances status: Secondary | ICD-10-CM

## 2023-11-29 DIAGNOSIS — Z823 Family history of stroke: Secondary | ICD-10-CM

## 2023-11-29 DIAGNOSIS — J9611 Chronic respiratory failure with hypoxia: Secondary | ICD-10-CM | POA: Diagnosis present

## 2023-11-29 DIAGNOSIS — J209 Acute bronchitis, unspecified: Secondary | ICD-10-CM | POA: Diagnosis present

## 2023-11-29 DIAGNOSIS — R7303 Prediabetes: Secondary | ICD-10-CM | POA: Diagnosis present

## 2023-11-29 DIAGNOSIS — Z7951 Long term (current) use of inhaled steroids: Secondary | ICD-10-CM

## 2023-11-29 DIAGNOSIS — H9190 Unspecified hearing loss, unspecified ear: Secondary | ICD-10-CM | POA: Diagnosis present

## 2023-11-29 LAB — BLOOD GAS, VENOUS
Acid-Base Excess: 4.2 mmol/L — ABNORMAL HIGH (ref 0.0–2.0)
Bicarbonate: 30.3 mmol/L — ABNORMAL HIGH (ref 20.0–28.0)
O2 Saturation: 87.3 %
Patient temperature: 37
pCO2, Ven: 50 mmHg (ref 44–60)
pH, Ven: 7.39 (ref 7.25–7.43)
pO2, Ven: 53 mmHg — ABNORMAL HIGH (ref 32–45)

## 2023-11-29 LAB — CBC WITH DIFFERENTIAL/PLATELET
Abs Immature Granulocytes: 0.04 10*3/uL (ref 0.00–0.07)
Basophils Absolute: 0 10*3/uL (ref 0.0–0.1)
Basophils Relative: 0 %
Eosinophils Absolute: 0.2 10*3/uL (ref 0.0–0.5)
Eosinophils Relative: 2 %
HCT: 34.6 % — ABNORMAL LOW (ref 36.0–46.0)
Hemoglobin: 10.7 g/dL — ABNORMAL LOW (ref 12.0–15.0)
Immature Granulocytes: 0 %
Lymphocytes Relative: 23 %
Lymphs Abs: 2.2 10*3/uL (ref 0.7–4.0)
MCH: 27.4 pg (ref 26.0–34.0)
MCHC: 30.9 g/dL (ref 30.0–36.0)
MCV: 88.7 fL (ref 80.0–100.0)
Monocytes Absolute: 0.7 10*3/uL (ref 0.1–1.0)
Monocytes Relative: 7 %
Neutro Abs: 6.6 10*3/uL (ref 1.7–7.7)
Neutrophils Relative %: 68 %
Platelets: 259 10*3/uL (ref 150–400)
RBC: 3.9 MIL/uL (ref 3.87–5.11)
RDW: 14.6 % (ref 11.5–15.5)
WBC: 9.7 10*3/uL (ref 4.0–10.5)
nRBC: 0 % (ref 0.0–0.2)

## 2023-11-29 LAB — COMPREHENSIVE METABOLIC PANEL WITH GFR
ALT: 17 U/L (ref 0–44)
AST: 18 U/L (ref 15–41)
Albumin: 3.9 g/dL (ref 3.5–5.0)
Alkaline Phosphatase: 49 U/L (ref 38–126)
Anion gap: 12 (ref 5–15)
BUN: 11 mg/dL (ref 8–23)
CO2: 25 mmol/L (ref 22–32)
Calcium: 8.6 mg/dL — ABNORMAL LOW (ref 8.9–10.3)
Chloride: 92 mmol/L — ABNORMAL LOW (ref 98–111)
Creatinine, Ser: 0.54 mg/dL (ref 0.44–1.00)
GFR, Estimated: >60 mL/min
Glucose, Bld: 121 mg/dL — ABNORMAL HIGH (ref 70–99)
Potassium: 4 mmol/L (ref 3.5–5.1)
Sodium: 129 mmol/L — ABNORMAL LOW (ref 135–145)
Total Bilirubin: 0.8 mg/dL (ref 0.0–1.2)
Total Protein: 6.8 g/dL (ref 6.5–8.1)

## 2023-11-29 LAB — BASIC METABOLIC PANEL WITH GFR
Anion gap: 10 (ref 5–15)
Anion gap: 9 (ref 5–15)
BUN: 10 mg/dL (ref 8–23)
BUN: 14 mg/dL (ref 8–23)
CO2: 25 mmol/L (ref 22–32)
CO2: 26 mmol/L (ref 22–32)
Calcium: 8.2 mg/dL — ABNORMAL LOW (ref 8.9–10.3)
Calcium: 9.1 mg/dL (ref 8.9–10.3)
Chloride: 98 mmol/L (ref 98–111)
Chloride: 103 mmol/L (ref 98–111)
Creatinine, Ser: 0.66 mg/dL (ref 0.44–1.00)
Creatinine, Ser: 0.76 mg/dL (ref 0.44–1.00)
GFR, Estimated: >60 mL/min (ref >60)
GFR, Estimated: >60 mL/min (ref >60)
Glucose, Bld: 132 mg/dL — ABNORMAL HIGH (ref 70–99)
Glucose, Bld: 130 mg/dL — ABNORMAL HIGH (ref 70–99)
Potassium: 4 mmol/L (ref 3.5–5.1)
Potassium: 4.2 mmol/L (ref 3.5–5.1)
Sodium: 133 mmol/L — ABNORMAL LOW (ref 135–145)
Sodium: 138 mmol/L (ref 135–145)

## 2023-11-29 LAB — RESP PANEL BY RT-PCR (RSV, FLU A&B, COVID)  RVPGX2
Influenza A by PCR: NEGATIVE
Influenza B by PCR: NEGATIVE
Resp Syncytial Virus by PCR: NEGATIVE
SARS Coronavirus 2 by RT PCR: NEGATIVE

## 2023-11-29 LAB — MRSA NEXT GEN BY PCR, NASAL: MRSA by PCR Next Gen: NOT DETECTED

## 2023-11-29 MED ORDER — DOXYCYCLINE HYCLATE 100 MG PO TABS
100.0000 mg | ORAL_TABLET | Freq: Two times a day (BID) | ORAL | Status: DC
  Administered 2023-11-29 – 2023-12-01 (×4): 100 mg via ORAL
  Filled 2023-11-29 (×4): qty 1

## 2023-11-29 MED ORDER — SENNOSIDES-DOCUSATE SODIUM 8.6-50 MG PO TABS
1.0000 | ORAL_TABLET | Freq: Every evening | ORAL | Status: DC | PRN
Start: 2023-11-29 — End: 2023-11-30

## 2023-11-29 MED ORDER — ONDANSETRON HCL 4 MG/2ML IJ SOLN: 4.0000 mg | Freq: Four times a day (QID) | INTRAMUSCULAR | Status: DC | PRN

## 2023-11-29 MED ORDER — CHLORHEXIDINE GLUCONATE CLOTH 2 % EX PADS: 6.0000 | MEDICATED_PAD | Freq: Every day | CUTANEOUS | Status: DC

## 2023-11-29 MED ORDER — ONDANSETRON HCL 4 MG PO TABS: 4.0000 mg | ORAL_TABLET | Freq: Four times a day (QID) | ORAL | Status: DC | PRN

## 2023-11-29 MED ORDER — PANTOPRAZOLE SODIUM 40 MG PO TBEC
40.0000 mg | DELAYED_RELEASE_TABLET | Freq: Every day | ORAL | Status: DC
  Administered 2023-11-29 – 2023-12-01 (×3): 40 mg via ORAL
  Filled 2023-11-29 (×3): qty 1

## 2023-11-29 MED ORDER — ENOXAPARIN SODIUM 40 MG/0.4ML IJ SOSY
40.0000 mg | PREFILLED_SYRINGE | INTRAMUSCULAR | Status: DC
  Administered 2023-11-29 – 2023-12-01 (×3): 40 mg via SUBCUTANEOUS
  Filled 2023-11-29 (×3): qty 0.4

## 2023-11-29 MED ORDER — IBUPROFEN 200 MG PO TABS: 400.0000 mg | ORAL_TABLET | Freq: Four times a day (QID) | ORAL | Status: DC | PRN

## 2023-11-29 MED ORDER — IPRATROPIUM-ALBUTEROL 0.5-2.5 (3) MG/3ML IN SOLN
3.0000 mL | RESPIRATORY_TRACT | Status: DC | PRN
  Administered 2023-11-30 (×2): 3 mL via RESPIRATORY_TRACT
  Filled 2023-11-29 (×2): qty 3

## 2023-11-29 MED ORDER — LEVALBUTEROL HCL 1.25 MG/0.5ML IN NEBU
1.2500 mg | INHALATION_SOLUTION | Freq: Once | RESPIRATORY_TRACT | Status: AC
  Administered 2023-11-29: 1.25 mg via RESPIRATORY_TRACT
  Filled 2023-11-29: qty 0.5

## 2023-11-29 MED ORDER — MELATONIN 5 MG PO TABS
5.0000 mg | ORAL_TABLET | Freq: Every evening | ORAL | Status: DC | PRN
  Administered 2023-11-29: 5 mg via ORAL
  Filled 2023-11-29: qty 1

## 2023-11-29 MED ORDER — IPRATROPIUM-ALBUTEROL 0.5-2.5 (3) MG/3ML IN SOLN
3.0000 mL | Freq: Three times a day (TID) | RESPIRATORY_TRACT | Status: DC
  Administered 2023-11-29 – 2023-12-01 (×6): 3 mL via RESPIRATORY_TRACT
  Filled 2023-11-29 (×6): qty 3

## 2023-11-29 MED ORDER — SODIUM CHLORIDE 0.9 % IV BOLUS
1000.0000 mL | Freq: Once | INTRAVENOUS | Status: AC
  Administered 2023-11-29: 1000 mL via INTRAVENOUS

## 2023-11-29 MED ORDER — SODIUM CHLORIDE 0.9 % IV SOLN
100.0000 mg | Freq: Once | INTRAVENOUS | Status: AC
  Administered 2023-11-29: 100 mg via INTRAVENOUS
  Filled 2023-11-29: qty 100

## 2023-11-29 MED ORDER — SODIUM CHLORIDE 0.9% FLUSH
3.0000 mL | Freq: Two times a day (BID) | INTRAVENOUS | Status: DC
  Administered 2023-11-29 – 2023-12-01 (×5): 3 mL via INTRAVENOUS

## 2023-11-29 NOTE — Progress Notes (Signed)
 [facility]                                                                               Genene Kilman, is a 63 y.o. female, DOB - 07-27-1960, FMW:[id number] Admit date - 11/29/2023    Outpatient Primary MD for the patient is Osei-Bonsu, Zachary, MD  LOS - 0  days    Brief summary    [name] is a 63 y.o. female with medical history significant for severe COPD with chronic hypoxic respiratory failure and mild CAD on recent coronary CTA and now presents with worsening shortness of breath.    Labs are most notable for sodium 129, normal creatinine, normal WBC, and negative COVID, influenza, and RSV PCR.  Chest x-ray is negative for acute cardiopulmonary disease.  she was placed on BIPAP Overnight and transitioned to [place] oxygen  this am.   Assessment & Plan    Assessment and Plan:  Acute respiratory failure with hypoxia secondary to acute copd exacerbation Continue with IV steroids, bronchodilators and bipap prn.  Continue with azithromycin .    Hyponatremia Improved to 133.    Mild anemia of chronic disease.   Estimated body mass index is 27.91 kg/m as calculated from the following:   Height as of this encounter: 5' 1 (1.549 m).   Weight as of this encounter: 67 kg.  Code Status: full code.  DVT Prophylaxis:  enoxaparin  (LOVENOX ) injection 40 mg Start: 11/29/23 1000   Level of Care: Level of care: Stepdown Family Communication: none at bedside  Disposition Plan:     Remains inpatient appropriate:  pendng clinical improvement.   Procedures:  None.   Consultants:   None.   Antimicrobials:   Anti-infectives (From admission, onward)    Start     Dose/Rate Route Frequency Ordered Stop   11/29/23 1800  doxycycline  (VIBRA -TABS) tablet 100 mg        100 mg Oral Every 12 hours 11/29/23 0411 12/04/23 0559   11/29/23 0415  doxycycline  (VIBRAMYCIN ) 100 mg in sodium chloride  0.9 % 250 mL IVPB        100 mg 125 mL/hr over 120 Minutes Intravenous   Once 11/29/23 0411 11/29/23 0933        Medications  Scheduled Meds:  Chlorhexidine  Gluconate Cloth  6 each Topical Daily   doxycycline   100 mg Oral Q12H   enoxaparin  (LOVENOX ) injection  40 mg Subcutaneous Q24H   ipratropium-albuterol   3 mL Nebulization TID   pantoprazole   40 mg Oral Daily   sodium chloride  flush  3 mL Intravenous Q12H   Continuous Infusions: PRN Meds:.ibuprofen , ipratropium-albuterol , ondansetron  **OR** ondansetron  (ZOFRAN ) IV, senna-docusate    Subjective:   [name] was seen and examined today. Sob,   Objective:   Vitals:   11/29/23 1000 11/29/23 1100 11/29/23 1200 11/29/23 1344  BP: 138/67 102/61 (!) 98/59   Pulse: 94 76 72   Resp: 20 (!) 23 20   Temp:   98.2 F (36.8 C)   TempSrc:   Oral   SpO2: 99% 100% 99% 97%  Weight:      Height:        Intake/Output Summary (Last 24 hours) at 11/29/2023  1436 Last data filed at 11/29/2023 0923 Gross per 24 hour  Intake 295.04 ml  Output 650 ml  Net -354.96 ml   Filed Weights   11/29/23 0117 11/29/23 0530  Weight: 68 kg 67 kg     Exam General: Alert and oriented x 3, NAD Cardiovascular: S1 S2 auscultated, no murmurs, RRR Respiratory: bilateral exp wheezing heard. Pt is on 3 lit of [place] oxygen .  Gastrointestinal: Soft, nontender, nondistended, + bowel sounds Ext: no pedal edema bilaterally Neuro: AAOx3,  Skin: No rashes Psych: anxious.    Data Reviewed:  I have personally reviewed following labs and imaging studies   CBC Lab Results  Component Value Date   WBC 9.7 11/29/2023   RBC 3.90 11/29/2023   HGB 10.7 (L) 11/29/2023   HCT 34.6 (L) 11/29/2023   MCV 88.7 11/29/2023   MCH 27.4 11/29/2023   PLT 259 11/29/2023   MCHC 30.9 11/29/2023   RDW 14.6 11/29/2023   LYMPHSABS 2.2 11/29/2023   MONOABS 0.7 11/29/2023   EOSABS 0.2 11/29/2023   BASOSABS 0.0 11/29/2023     Last metabolic panel Lab Results  Component Value Date   NA 133 (L) 11/29/2023   K 4.0 11/29/2023   CL 98  11/29/2023   CO2 25 11/29/2023   BUN 10 11/29/2023   CREATININE 0.66 11/29/2023   GLUCOSE 132 (H) 11/29/2023   GFRNONAA >60 11/29/2023   GFRAA 106 06/25/2020   CALCIUM 8.2 (L) 11/29/2023   PHOS 3.6 08/11/2022   PROT 6.8 11/29/2023   ALBUMIN 3.9 11/29/2023   LABGLOB 2.2 09/19/2022   AGRATIO 1.9 09/19/2022   BILITOT 0.8 11/29/2023   ALKPHOS 49 11/29/2023   AST 18 11/29/2023   ALT 17 11/29/2023   ANIONGAP 10 11/29/2023    CBG (last 3)  No results for input(s): GLUCAP in the last 72 hours.    Coagulation Profile: No results for input(s): INR, PROTIME in the last 168 hours.   Radiology Studies: DG Chest Port 1 View Result Date: 11/29/2023 CLINICAL DATA:  Shortness of breath EXAM: PORTABLE CHEST 1 VIEW COMPARISON:  10/11/2023, 11/10/2023 FINDINGS: Cardiac shadow is within normal limits. The lungs are well aerated bilaterally. No focal infiltrate or effusion is seen. No bony abnormality is noted. IMPRESSION: No acute abnormality noted. Electronically Signed   By: Oneil Devonshire M.D.   On: 11/29/2023 01:34       Elgie Butter M.D. [facility] 11/29/2023, 2:36 PM  Available via Epic secure chat 7am-7pm After 7 pm, please refer to night coverage provider listed on amion.

## 2023-11-29 NOTE — ED Notes (Signed)
 This RN updated son Paxton Kanaan

## 2023-11-29 NOTE — ED Triage Notes (Addendum)
 Pt BIBA from home after calling medics and unable to get out of  her car w/ respiratory distress. Baseline 5L O2. Pt states that sx gradually increased throughout day. Denies CP,vomiting, cough,fever, or chills. Endorses lightheadedness and nausea.  Nebs x2 Solu-Medrol : 125mg  Mag 2g Epi(IM) 0.4 18G-LFA  110-28-140/98-100%5L

## 2023-11-29 NOTE — Plan of Care (Signed)
  Problem: Education: Goal: Knowledge of disease or condition will improve Outcome: Progressing   Problem: Activity: Goal: Ability to tolerate increased activity will improve Outcome: Progressing   Problem: Respiratory: Goal: Ability to maintain a clear airway will improve Outcome: Progressing Goal: Levels of oxygenation will improve Outcome: Progressing   Problem: Clinical Measurements: Goal: Ability to maintain clinical measurements within normal limits will improve Outcome: Progressing
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
## 2023-11-29 NOTE — Telephone Encounter (Signed)
 Dr. Theophilus, please advise on refill. Pt currently admitted and was given Protonix  today.
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
## 2023-11-30 DIAGNOSIS — E871 Hypo-osmolality and hyponatremia: Secondary | ICD-10-CM | POA: Diagnosis not present

## 2023-11-30 DIAGNOSIS — K5900 Constipation, unspecified: Secondary | ICD-10-CM

## 2023-11-30 DIAGNOSIS — J441 Chronic obstructive pulmonary disease with (acute) exacerbation: Secondary | ICD-10-CM | POA: Diagnosis not present

## 2023-11-30 DIAGNOSIS — J9611 Chronic respiratory failure with hypoxia: Secondary | ICD-10-CM | POA: Diagnosis not present

## 2023-11-30 LAB — BASIC METABOLIC PANEL WITH GFR
Anion gap: 8 (ref 5–15)
Anion gap: 8 (ref 5–15)
BUN: 16 mg/dL (ref 8–23)
BUN: 18 mg/dL (ref 8–23)
CO2: 28 mmol/L (ref 22–32)
CO2: 28 mmol/L (ref 22–32)
Calcium: 9.1 mg/dL (ref 8.9–10.3)
Calcium: 9.1 mg/dL (ref 8.9–10.3)
Chloride: 103 mmol/L (ref 98–111)
Chloride: 100 mmol/L (ref 98–111)
Creatinine, Ser: 0.59 mg/dL (ref 0.44–1.00)
Creatinine, Ser: 0.54 mg/dL (ref 0.44–1.00)
GFR, Estimated: >60 mL/min
GFR, Estimated: >60 mL/min (ref >60)
Glucose, Bld: 84 mg/dL (ref 70–99)
Glucose, Bld: 179 mg/dL — ABNORMAL HIGH (ref 70–99)
Potassium: 4.3 mmol/L (ref 3.5–5.1)
Potassium: 4.5 mmol/L (ref 3.5–5.1)
Sodium: 139 mmol/L (ref 135–145)
Sodium: 136 mmol/L (ref 135–145)

## 2023-11-30 LAB — CBC
HCT: 35.7 % — ABNORMAL LOW (ref 36.0–46.0)
Hemoglobin: 11 g/dL — ABNORMAL LOW (ref 12.0–15.0)
MCH: 28.1 pg (ref 26.0–34.0)
MCHC: 30.8 g/dL (ref 30.0–36.0)
MCV: 91.3 fL (ref 80.0–100.0)
Platelets: 245 K/uL (ref 150–400)
RBC: 3.91 MIL/uL (ref 3.87–5.11)
RDW: 14.9 % (ref 11.5–15.5)
WBC: 10.6 K/uL — ABNORMAL HIGH (ref 4.0–10.5)
nRBC: 0 % (ref 0.0–0.2)

## 2023-11-30 MED ORDER — SENNOSIDES-DOCUSATE SODIUM 8.6-50 MG PO TABS
2.0000 | ORAL_TABLET | Freq: Two times a day (BID) | ORAL | Status: DC
  Administered 2023-11-30 – 2023-12-01 (×3): 2 via ORAL
  Filled 2023-11-30 (×3): qty 2

## 2023-11-30 MED ORDER — METHYLPREDNISOLONE SODIUM SUCC 40 MG IJ SOLR
40.0000 mg | Freq: Two times a day (BID) | INTRAMUSCULAR | Status: DC
  Administered 2023-11-30 – 2023-12-01 (×3): 40 mg via INTRAVENOUS
  Filled 2023-11-30 (×3): qty 1

## 2023-11-30 MED ORDER — PREDNISONE 20 MG PO TABS: 60.0000 mg | ORAL_TABLET | Freq: Every day | ORAL | Status: DC

## 2023-11-30 MED ORDER — POLYETHYLENE GLYCOL 3350 17 G PO PACK
17.0000 g | PACK | Freq: Every day | ORAL | Status: DC
  Administered 2023-11-30 – 2023-12-01 (×2): 17 g via ORAL
  Filled 2023-11-30 (×2): qty 1

## 2023-11-30 NOTE — Plan of Care (Signed)
   Problem: Education: Goal: Knowledge of disease or condition will improve Outcome: Progressing Goal: Knowledge of the prescribed therapeutic regimen will improve Outcome: Progressing Goal: Individualized Educational Video(s) Outcome: Progressing   Problem: Activity: Goal: Ability to tolerate increased activity will improve Outcome: Progressing Goal: Will verbalize the importance of balancing activity with adequate rest periods Outcome: Progressing   Problem: Respiratory: Goal: Ability to maintain a clear airway will improve Outcome: Progressing Goal: Levels of oxygenation will improve Outcome: Progressing Goal: Ability to maintain adequate ventilation will improve Outcome: Progressing   Problem: Education: Goal: Knowledge of General Education information will improve Description: Including pain rating scale, medication(s)/side effects and non-pharmacologic comfort measures Outcome: Progressing   Problem: Health Behavior/Discharge Planning: Goal: Ability to manage health-related needs will improve Outcome: Progressing   Problem: Clinical Measurements: Goal: Ability to maintain clinical measurements within normal limits will improve Outcome: Progressing Goal: Will remain free from infection Outcome: Progressing Goal: Diagnostic test results will improve Outcome: Progressing Goal: Respiratory complications will improve Outcome: Progressing Goal: Cardiovascular complication will be avoided Outcome: Progressing   Problem: Activity: Goal: Risk for activity intolerance will decrease Outcome: Progressing   Problem: Nutrition: Goal: Adequate nutrition will be maintained Outcome: Progressing   Problem: Coping: Goal: Level of anxiety will decrease Outcome: Progressing   Problem: Elimination: Goal: Will not experience complications related to bowel motility Outcome: Progressing Goal: Will not experience complications related to urinary retention Outcome: Progressing    Problem: Pain Managment: Goal: General experience of comfort will improve and/or be controlled Outcome: Progressing   Problem: Safety: Goal: Ability to remain free from injury will improve Outcome: Progressing   Problem: Skin Integrity: Goal: Risk for impaired skin integrity will decrease Outcome: Progressing
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
## 2023-11-30 NOTE — Plan of Care (Signed)
  Problem: Education: Goal: Knowledge of disease or condition will improve Outcome: Progressing   Problem: Respiratory: Goal: Ability to maintain a clear airway will improve Outcome: Progressing Goal: Levels of oxygenation will improve Outcome: Progressing   Problem: Coping: Goal: Level of anxiety will decrease Outcome: Progressing   Problem: Elimination: Goal: Will not experience complications related to urinary retention Outcome: Progressing   Problem: Pain Managment: Goal: General experience of comfort will improve and/or be controlled Outcome: Progressing   Problem: Safety: Goal: Ability to remain free from injury will improve Outcome: Progressing   Problem: Skin Integrity: Goal: Risk for impaired skin integrity will decrease Outcome: Progressing

## 2023-11-30 NOTE — Progress Notes (Signed)
 [facility]                                                                               [name], is a 63 y.o. female, DOB - [birth year], FMW:[id number] Admit date - 11/29/2023    Outpatient Primary MD for the patient is Osei-Bonsu, Zachary, MD  LOS - 1  days    Brief summary    [name] is a 63 y.o. female with medical history significant for severe COPD with chronic hypoxic respiratory failure and mild CAD on recent coronary CTA and now presents with worsening shortness of breath.    Labs are most notable for sodium 129, normal creatinine, normal WBC, and negative COVID, influenza, and RSV PCR.  Chest x-ray is negative for acute cardiopulmonary disease.  she was placed on BIPAP Overnight and transitioned to [place] oxygen  this am.   Assessment & Plan    Assessment and Plan:  Acute respiratory failure with hypoxia secondary to acute copd exacerbation Continue with IV steroids, bronchodilators and bipap prn.  Continue with azithromycin  for acute bronchitis.  Patient with persistent wheezing on exam today, recommend to continue with IV solumedrol for another 24 hours and monitor.  Therapy evaluations and check ambulating oxygen  levels.     Constipation; Continue with miralax  and senna/ colace.    Hyponatremia Resolved.     Mild anemia of chronic disease.  Hemoglobin stable.     Estimated body mass index is 27.91 kg/m as calculated from the following:   Height as of this encounter: 5' 1 (1.549 m).   Weight as of this encounter: 67 kg.  Code Status: full code.  DVT Prophylaxis:  enoxaparin  (LOVENOX ) injection 40 mg Start: 11/29/23 1000   Level of Care: Level of care: Med-Surg Family Communication: none at bedside  Disposition Plan:     Remains inpatient appropriate:  pendng clinical improvement.   Procedures:  None.   Consultants:   None.   Antimicrobials:   Anti-infectives (From admission, onward)    Start     Dose/Rate  Route Frequency Ordered Stop   11/29/23 1800  doxycycline  (VIBRA -TABS) tablet 100 mg        100 mg Oral Every 12 hours 11/29/23 0411 12/04/23 0559   11/29/23 0415  doxycycline  (VIBRAMYCIN ) 100 mg in sodium chloride  0.9 % 250 mL IVPB        100 mg 125 mL/hr over 120 Minutes Intravenous  Once 11/29/23 0411 11/29/23 0933        Medications  Scheduled Meds:  Chlorhexidine  Gluconate Cloth  6 each Topical Daily   doxycycline   100 mg Oral Q12H   enoxaparin  (LOVENOX ) injection  40 mg Subcutaneous Q24H   ipratropium-albuterol   3 mL Nebulization TID   methylPREDNISolone  (SOLU-MEDROL ) injection  40 mg Intravenous Q12H   pantoprazole   40 mg Oral Daily   polyethylene glycol  17 g Oral Daily   senna-docusate  2 tablet Oral BID   sodium chloride  flush  3 mL Intravenous Q12H   Continuous Infusions: PRN Meds:.ibuprofen , ipratropium-albuterol , melatonin, ondansetron  **OR** ondansetron  (ZOFRAN ) IV    Subjective:   [name] was seen and examined today. Constipated, still sob and diffusely wheezing.  Objective:   Vitals:   11/30/23 0625 11/30/23 0700 11/30/23 0800 11/30/23 0806  BP:      Pulse: 94 75 80   Resp: 20 (!) 22 16   Temp:   98.1 F (36.7 C)   TempSrc:   Oral   SpO2: 100% 99% 100% 93%  Weight:      Height:        Intake/Output Summary (Last 24 hours) at 11/30/2023 1027 Last data filed at 11/30/2023 0925 Gross per 24 hour  Intake 363 ml  Output 2150 ml  Net -1787 ml   Filed Weights   11/29/23 0117 11/29/23 0530  Weight: 68 kg 67 kg     Exam General exam: Appears calm and comfortable  Respiratory system: bilateral exp wheezing heard. On 4 lit of [place] oxygen .  Cardiovascular system: S1 & S2 heard, RRR.  Gastrointestinal system: Abdomen is nondistended, soft and nontender.  Central nervous system: Alert and oriented. No focal neurological deficits. Extremities: Symmetric 5 x 5 power. Skin: No rashes, Psychiatry: Mood & affect appropriate.     Data  Reviewed:  I have personally reviewed following labs and imaging studies   CBC Lab Results  Component Value Date   WBC 10.6 (H) 11/30/2023   RBC 3.91 11/30/2023   HGB 11.0 (L) 11/30/2023   HCT 35.7 (L) 11/30/2023   MCV 91.3 11/30/2023   MCH 28.1 11/30/2023   PLT 245 11/30/2023   MCHC 30.8 11/30/2023   RDW 14.9 11/30/2023   LYMPHSABS 2.2 11/29/2023   MONOABS 0.7 11/29/2023   EOSABS 0.2 11/29/2023   BASOSABS 0.0 11/29/2023     Last metabolic panel Lab Results  Component Value Date   NA 139 11/30/2023   K 4.3 11/30/2023   CL 103 11/30/2023   CO2 28 11/30/2023   BUN 16 11/30/2023   CREATININE 0.59 11/30/2023   GLUCOSE 84 11/30/2023   GFRNONAA >60 11/30/2023   GFRAA 106 06/25/2020   CALCIUM 9.1 11/30/2023   PHOS 3.6 08/11/2022   PROT 6.8 11/29/2023   ALBUMIN 3.9 11/29/2023   LABGLOB 2.2 09/19/2022   AGRATIO 1.9 09/19/2022   BILITOT 0.8 11/29/2023   ALKPHOS 49 11/29/2023   AST 18 11/29/2023   ALT 17 11/29/2023   ANIONGAP 8 11/30/2023    CBG (last 3)  No results for input(s): GLUCAP in the last 72 hours.    Coagulation Profile: No results for input(s): INR, PROTIME in the last 168 hours.   Radiology Studies: DG Chest Port 1 View Result Date: 11/29/2023 CLINICAL DATA:  Shortness of breath EXAM: PORTABLE CHEST 1 VIEW COMPARISON:  10/11/2023, 11/10/2023 FINDINGS: Cardiac shadow is within normal limits. The lungs are well aerated bilaterally. No focal infiltrate or effusion is seen. No bony abnormality is noted. IMPRESSION: No acute abnormality noted. Electronically Signed   By: Oneil Devonshire M.D.   On: 11/29/2023 01:34       Elgie Butter M.D. [facility] 11/30/2023, 10:27 AM  Available via Epic secure chat 7am-7pm After 7 pm, please refer to night coverage provider listed on amion.

## 2023-11-30 NOTE — Plan of Care (Signed)
 Problem: Education: Goal: Knowledge of disease or condition will improve 11/30/2023 1852 by Melvyn Melvyn NOVAK, RN Outcome: Progressing 11/30/2023 1720 by Melvyn Melvyn NOVAK, RN Outcome: Progressing Goal: Knowledge of the prescribed therapeutic regimen will improve 11/30/2023 1852 by Melvyn Melvyn NOVAK, RN Outcome: Progressing 11/30/2023 1720 by Melvyn Melvyn NOVAK, RN Outcome: Progressing Goal: Individualized Educational Video(s) 11/30/2023 1852 by Melvyn Melvyn NOVAK, RN Outcome: Progressing 11/30/2023 1720 by Melvyn Melvyn NOVAK, RN Outcome: Progressing   Problem: Activity: Goal: Ability to tolerate increased activity will improve 11/30/2023 1852 by Melvyn Melvyn NOVAK, RN Outcome: Progressing 11/30/2023 1720 by Melvyn Melvyn NOVAK, RN Outcome: Progressing Goal: Will verbalize the importance of balancing activity with adequate rest periods 11/30/2023 1852 by Melvyn Melvyn NOVAK, RN Outcome: Progressing 11/30/2023 1720 by Melvyn Melvyn NOVAK, RN Outcome: Progressing   Problem: Respiratory: Goal: Ability to maintain a clear airway will improve 11/30/2023 1852 by Melvyn Melvyn NOVAK, RN Outcome: Progressing 11/30/2023 1720 by Melvyn Melvyn NOVAK, RN Outcome: Progressing Goal: Levels of oxygenation will improve 11/30/2023 1852 by Melvyn Melvyn NOVAK, RN Outcome: Progressing 11/30/2023 1720 by Melvyn Melvyn NOVAK, RN Outcome: Progressing Goal: Ability to maintain adequate ventilation will improve 11/30/2023 1852 by Melvyn Melvyn NOVAK, RN Outcome: Progressing 11/30/2023 1720 by Melvyn Melvyn NOVAK, RN Outcome: Progressing   Problem: Education: Goal: Knowledge of General Education information will improve Description: Including pain rating scale, medication(s)/side effects and non-pharmacologic comfort measures 11/30/2023 1852 by Melvyn Melvyn NOVAK, RN Outcome: Progressing 11/30/2023 1720 by Melvyn Melvyn NOVAK, RN Outcome: Progressing   Problem: Health Behavior/Discharge Planning: Goal: Ability to manage health-related needs  will improve 11/30/2023 1852 by Melvyn Melvyn NOVAK, RN Outcome: Progressing 11/30/2023 1720 by Melvyn Melvyn NOVAK, RN Outcome: Progressing   Problem: Clinical Measurements: Goal: Ability to maintain clinical measurements within normal limits will improve 11/30/2023 1852 by Melvyn Melvyn NOVAK, RN Outcome: Progressing 11/30/2023 1720 by Melvyn Melvyn NOVAK, RN Outcome: Progressing Goal: Will remain free from infection 11/30/2023 1852 by Melvyn Melvyn NOVAK, RN Outcome: Progressing 11/30/2023 1720 by Melvyn Melvyn NOVAK, RN Outcome: Progressing Goal: Diagnostic test results will improve 11/30/2023 1852 by Melvyn Melvyn NOVAK, RN Outcome: Progressing 11/30/2023 1720 by Melvyn Melvyn NOVAK, RN Outcome: Progressing Goal: Respiratory complications will improve 11/30/2023 1852 by Melvyn Melvyn NOVAK, RN Outcome: Progressing 11/30/2023 1720 by Melvyn Melvyn NOVAK, RN Outcome: Progressing Goal: Cardiovascular complication will be avoided 11/30/2023 1852 by Melvyn Melvyn NOVAK, RN Outcome: Progressing 11/30/2023 1720 by Melvyn Melvyn NOVAK, RN Outcome: Progressing   Problem: Activity: Goal: Risk for activity intolerance will decrease 11/30/2023 1852 by Melvyn Melvyn NOVAK, RN Outcome: Progressing 11/30/2023 1720 by Melvyn Melvyn NOVAK, RN Outcome: Progressing   Problem: Nutrition: Goal: Adequate nutrition will be maintained 11/30/2023 1852 by Melvyn Melvyn NOVAK, RN Outcome: Progressing 11/30/2023 1720 by Melvyn Melvyn NOVAK, RN Outcome: Progressing   Problem: Coping: Goal: Level of anxiety will decrease 11/30/2023 1852 by Melvyn Melvyn NOVAK, RN Outcome: Progressing 11/30/2023 1720 by Melvyn Melvyn NOVAK, RN Outcome: Progressing   Problem: Elimination: Goal: Will not experience complications related to bowel motility 11/30/2023 1852 by Melvyn Melvyn NOVAK, RN Outcome: Progressing 11/30/2023 1720 by Melvyn Melvyn NOVAK, RN Outcome: Progressing Goal: Will not experience complications related to urinary retention 11/30/2023 1852 by Melvyn Melvyn NOVAK, RN Outcome: Progressing 11/30/2023 1720 by Melvyn Melvyn NOVAK, RN Outcome: Progressing   Problem: Pain Managment: Goal: General experience of comfort will improve and/or be controlled 11/30/2023 1852 by Melvyn Melvyn NOVAK, RN Outcome: Progressing 11/30/2023 1720 by Melvyn Melvyn NOVAK, RN Outcome: Progressing  Problem: Safety: Goal: Ability to remain free from injury will improve 11/30/2023 1852 by Melvyn Melvyn NOVAK, RN Outcome: Progressing 11/30/2023 1720 by Melvyn Melvyn NOVAK, RN Outcome: Progressing   Problem: Skin Integrity: Goal: Risk for impaired skin integrity will decrease 11/30/2023 1852 by Melvyn Melvyn NOVAK, RN Outcome: Progressing 11/30/2023 1720 by Melvyn Melvyn NOVAK, RN Outcome: Progressing

## 2023-12-01 DIAGNOSIS — J9611 Chronic respiratory failure with hypoxia: Secondary | ICD-10-CM | POA: Diagnosis not present

## 2023-12-01 DIAGNOSIS — E871 Hypo-osmolality and hyponatremia: Secondary | ICD-10-CM | POA: Diagnosis not present

## 2023-12-01 DIAGNOSIS — J441 Chronic obstructive pulmonary disease with (acute) exacerbation: Secondary | ICD-10-CM | POA: Diagnosis not present

## 2023-12-01 LAB — BASIC METABOLIC PANEL WITH GFR
Anion gap: 7 (ref 5–15)
BUN: 18 mg/dL (ref 8–23)
CO2: 30 mmol/L (ref 22–32)
Calcium: 9.3 mg/dL (ref 8.9–10.3)
Chloride: 98 mmol/L (ref 98–111)
Creatinine, Ser: 0.5 mg/dL (ref 0.44–1.00)
GFR, Estimated: >60 mL/min (ref >60)
Glucose, Bld: 131 mg/dL — ABNORMAL HIGH (ref 70–99)
Potassium: 4.5 mmol/L (ref 3.5–5.1)
Sodium: 135 mmol/L (ref 135–145)

## 2023-12-01 LAB — CBC
HCT: 35.1 % — ABNORMAL LOW (ref 36.0–46.0)
Hemoglobin: 11 g/dL — ABNORMAL LOW (ref 12.0–15.0)
MCH: 28.2 pg (ref 26.0–34.0)
MCHC: 31.3 g/dL (ref 30.0–36.0)
MCV: 90 fL (ref 80.0–100.0)
Platelets: 250 K/uL (ref 150–400)
RBC: 3.9 MIL/uL (ref 3.87–5.11)
RDW: 14.6 % (ref 11.5–15.5)
WBC: 10.3 K/uL (ref 4.0–10.5)
nRBC: 0 % (ref 0.0–0.2)

## 2023-12-01 MED ORDER — PREDNISONE 10 MG PO TABS
20.0000 mg | ORAL_TABLET | Freq: Every day | ORAL | Status: AC
Start: 2023-12-04 — End: 2024-01-01

## 2023-12-01 MED ORDER — PREDNISONE 20 MG PO TABS
40.0000 mg | ORAL_TABLET | Freq: Every day | ORAL | 0 refills | Status: AC
Start: 2023-12-01 — End: 2023-12-04

## 2023-12-01 MED ORDER — DOXYCYCLINE HYCLATE 100 MG PO TABS: 100.0000 mg | ORAL_TABLET | Freq: Two times a day (BID) | ORAL | 0 refills | Status: AC

## 2023-12-01 NOTE — TOC Transition Note (Signed)
 Transition of Care [facility]) - Discharge Note   Patient Details  Name: [name] MRN: [id number] Date of Birth: March 20, 1961  [facility] of [facility]) CM/SW Contact:  Doneta Glenys DASEN, RN Phone Number: 12/01/2023, 12:11 PM   Clinical Narrative:    Patient being discharge with travel tank that was delivered to patients room. Patient continue to state that she doesn't have transportation. [facility] was arranged for discharge to [address] [name], [place] [address]. TOC signing off    Final next level of care: Home/Self Care Barriers to Discharge: Barriers Resolved   Patient Goals and CMS Choice Patient states their goals for this hospitalization and ongoing recovery are:: Home with granddaughter CMS Medicare.gov Compare Post Acute Care list provided to::  (NA) Choice offered to / list presented to : NA [place] ownership interest in [facility].provided to:: Parent NA    Discharge Placement                    Patient and family notified of of transfer: 12/01/23  Discharge Plan and Services Additional resources added to the After Visit Summary for   In-house Referral: NA Discharge Planning Services: CM Consult            DME Arranged: Oxygen  (Required a transport tank) DME Agency: [facility] Date DME Agency Contacted: 12/01/23 Time DME Agency Contacted: [id number]   HH Arranged: NA HH Agency: NA        Social Drivers of Health (SDOH) Interventions SDOH Screenings   Food Insecurity: No Food Insecurity (11/29/2023)  Housing: Low Risk  (11/29/2023)  Transportation Needs: No Transportation Needs (11/29/2023)  Utilities: Not At Risk (11/29/2023)  Financial Resource Strain: Low Risk  (04/10/2019)  Physical Activity: Inactive (04/10/2019)  Social Connections: Moderately Integrated (11/29/2023)  Stress: No Stress Concern Present (04/10/2019)  Tobacco Use: Medium Risk (11/29/2023)     Readmission Risk Interventions    12/13/2022   11:05 AM  05/23/2022   10:12 AM  Readmission Risk Prevention Plan  Transportation Screening Complete Complete  PCP or Specialist Appt within 5-7 Days Complete   PCP or Specialist Appt within 3-5 Days  Complete  Home Care Screening Complete   Medication Review (RN CM) Complete   HRI or Home Care Consult  Complete  Social Work Consult for Recovery Care Planning/Counseling  Complete  Palliative Care Screening  Not Applicable  Medication Review Oceanographer)  Complete

## 2023-12-01 NOTE — TOC Progression Note (Signed)
 Transition of Care [facility]) - Progression Note    Patient Details  Name: [name] MRN: [id number] Date of Birth: 09/07/1960  Transition of [facility]) CM/SW Contact  Gabrielle Wakeland, Glenys DASEN, RN Phone Number:   Clinical Narrative:    [facility] called to order travel tank for discharge. 10:38 AM Safe transport will be called a discharge. CM presented the [employer] and Release of Liability form to patient. Patient was allowed to read and ask question before signing. The form is on the patients chart.    Expected Discharge Plan: Home/Self Care Barriers to Discharge: Continued Medical Work up  Expected Discharge Plan and Services In-house Referral: NA Discharge Planning Services: CM Consult   Living arrangements for the past 2 months: Apartment Expected Discharge Date: 12/01/23               DME Arranged: N/A DME Agency: NA       HH Arranged: NA HH Agency: NA         Social Determinants of Health (SDOH) Interventions SDOH Screenings   Food Insecurity: No Food Insecurity (11/29/2023)  Housing: Low Risk  (11/29/2023)  Transportation Needs: No Transportation Needs (11/29/2023)  Utilities: Not At Risk (11/29/2023)  Financial Resource Strain: Low Risk  (04/10/2019)  Physical Activity: Inactive (04/10/2019)  Social Connections: Moderately Integrated (11/29/2023)  Stress: No Stress Concern Present (04/10/2019)  Tobacco Use: Medium Risk (11/29/2023)    Readmission Risk Interventions    12/13/2022   11:05 AM 05/23/2022   10:12 AM  Readmission Risk Prevention Plan  Transportation Screening Complete Complete  PCP or Specialist Appt within 5-7 Days Complete   PCP or Specialist Appt within 3-5 Days  Complete  Home Care Screening Complete   Medication Review (RN CM) Complete   HRI or Home Care Consult  Complete  Social Work Consult for Recovery Care Planning/Counseling  Complete  Palliative Care Screening  Not Applicable  Medication Review Oceanographer)  Complete

## 2023-12-01 NOTE — Plan of Care (Signed)
   Problem: Education: Goal: Knowledge of disease or condition will improve Outcome: Progressing Goal: Knowledge of the prescribed therapeutic regimen will improve Outcome: Progressing Goal: Individualized Educational Video(s) Outcome: Progressing   Problem: Activity: Goal: Ability to tolerate increased activity will improve Outcome: Progressing Goal: Will verbalize the importance of balancing activity with adequate rest periods Outcome: Progressing   Problem: Respiratory: Goal: Ability to maintain a clear airway will improve Outcome: Progressing Goal: Levels of oxygenation will improve Outcome: Progressing Goal: Ability to maintain adequate ventilation will improve Outcome: Progressing   Problem: Education: Goal: Knowledge of General Education information will improve Description: Including pain rating scale, medication(s)/side effects and non-pharmacologic comfort measures Outcome: Progressing   Problem: Health Behavior/Discharge Planning: Goal: Ability to manage health-related needs will improve Outcome: Progressing   Problem: Clinical Measurements: Goal: Ability to maintain clinical measurements within normal limits will improve Outcome: Progressing Goal: Will remain free from infection Outcome: Progressing Goal: Diagnostic test results will improve Outcome: Progressing Goal: Respiratory complications will improve Outcome: Progressing Goal: Cardiovascular complication will be avoided Outcome: Progressing   Problem: Activity: Goal: Risk for activity intolerance will decrease Outcome: Progressing   Problem: Nutrition: Goal: Adequate nutrition will be maintained Outcome: Progressing   Problem: Coping: Goal: Level of anxiety will decrease Outcome: Progressing   Problem: Elimination: Goal: Will not experience complications related to bowel motility Outcome: Progressing Goal: Will not experience complications related to urinary retention Outcome: Progressing    Problem: Pain Managment: Goal: General experience of comfort will improve and/or be controlled Outcome: Progressing   Problem: Safety: Goal: Ability to remain free from injury will improve Outcome: Progressing   Problem: Skin Integrity: Goal: Risk for impaired skin integrity will decrease Outcome: Progressing

## 2023-12-01 NOTE — Discharge Summary (Addendum)
 Physician Discharge Summary   Patient: [name] MRN: [id number] DOB: [birth year]  Admit date:     11/29/2023  Discharge date: 12/01/23  Discharge Physician: Elgie Butter   PCP: Catalina Bare, MD   Recommendations at discharge:  Please follow up with Pulmonology in 1 to 2 weeks.  Please follow up with PCP in one week.   Discharge Diagnoses: Principal Problem:   COPD with acute exacerbation (HCC) Active Problems:   Chronic respiratory failure with hypoxia [facility])   Hyponatremia   Hospital Course: Ica Daye is a 63 y.o. female with medical history significant for severe COPD with chronic hypoxic respiratory failure and mild CAD on recent coronary CTA and now presents with worsening shortness of breath.     Labs are most notable for sodium 129, normal creatinine, normal WBC, and negative COVID, influenza, and RSV PCR.  Chest x-ray is negative for acute cardiopulmonary disease.  she was placed on BIPAP on admission and transitioned to [place] oxygen  in the morning.  Assessment and Plan:     Acute respiratory failure with hypoxia secondary to acute copd exacerbation Started  on IV steroids, bronchodilators and bipap prn.  Continue with doxycycline  for acute bronchitis.  She remains on 5 lit of [place] oxygen  ( baseline oxygen  use at home).  She was transitioned to oral steroids and recommended to follow up with pulmonology in 1 to 2 weeks.        Constipation; Resolved.      Hyponatremia Resolved.        Mild anemia of chronic disease.  Hemoglobin stable.     Consultants:  Procedures performed:  Disposition: Home Diet recommendation:  Regular diet DISCHARGE MEDICATION: Allergies as of 12/01/2023       Reactions   Amoxicillin  Shortness Of Breath   Statins Other (See Comments)   Worsened the patient's COPD   Tylenol  [acetaminophen ] Shortness Of Breath   Zetia  [ezetimibe ] Other (See Comments)   Worsened the patient's COPD        Medication List      STOP taking these medications    azithromycin  250 MG tablet Commonly known as: ZITHROMAX    furosemide  20 MG tablet Commonly known as: Lasix    metoprolol  tartrate 100 MG tablet Commonly known as: LOPRESSOR        TAKE these medications    albuterol  108 (90 Base) MCG/ACT inhaler Commonly known as: VENTOLIN  HFA Inhale 2 puffs into the lungs every 6 (six) hours as needed for wheezing or shortness of breath.   albuterol  (2.5 MG/3ML) 0.083% nebulizer solution Commonly known as: PROVENTIL  INHALE 3 ML BY NEBULIZATION EVERY 6 HOURS AS NEEDED FOR WHEEZING OR SHORTNESS OF BREATH   doxycycline  100 MG tablet Commonly known as: VIBRA -TABS Take 1 tablet (100 mg total) by mouth every 12 (twelve) hours for 3 days.   ipratropium-albuterol  0.5-2.5 (3) MG/3ML Soln Commonly known as: DUONEB Take 3 mLs by nebulization every 6 (six) hours. What changed:  when to take this reasons to take this   metFORMIN 500 MG tablet Commonly known as: GLUCOPHAGE Take 500 mg by mouth daily as needed (only taking when her BS is high).   nitroGLYCERIN  0.4 MG SL tablet Commonly known as: NITROSTAT  Place 1 tablet (0.4 mg total) under the tongue every 5 (five) minutes as needed. What changed: reasons to take this   omeprazole  20 MG capsule Commonly known as: PRILOSEC TAKE 1 CAPSULE BY MOUTH EVERY DAY   OXYGEN  Inhale 5 L/min into the lungs daily as needed (sob).  predniSONE  20 MG tablet Commonly known as: DELTASONE  Take 2 tablets (40 mg total) by mouth daily with breakfast for 3 days. What changed: You were already taking a medication with the same name, and this prescription was added. Make sure you understand how and when to take each.   predniSONE  10 MG tablet Commonly known as: DELTASONE  Take 2 tablets (20 mg total) by mouth daily with breakfast for 28 days. Start taking on: December 04, 2023 What changed: These instructions start on December 04, 2023. If you are unsure what to do until then, ask your  doctor or other care provider.   Repatha  SureClick 140 MG/ML Soaj Generic drug: Evolocumab  Inject 140 mg into the skin every 14 (fourteen) days.   Trelegy Ellipta  200-62.5-25 MCG/ACT Aepb Generic drug: Fluticasone -Umeclidin-Vilant Inhale 1 puff into the lungs daily. What changed:  when to take this reasons to take this        Discharge Exam: Filed Weights   11/29/23 0117 11/29/23 0530  Weight: 68 kg 67 kg   General exam: Appears calm and comfortable  Respiratory system:  air entry fair. On 5 lit of [place] oxygen .  Cardiovascular system: S1 & S2 heard, RRR. No JVD,  Gastrointestinal system: Abdomen is nondistended, soft and nontender.  Central nervous system: Alert and oriented. No focal neurological deficits. Extremities: Symmetric 5 x 5 power. Skin: No rashes Psychiatry: Mood & affect appropriate.    Condition at discharge: fair  The results of significant diagnostics from this hospitalization (including imaging, microbiology, ancillary and laboratory) are listed below for reference.   Imaging Studies: DG Chest Port 1 View Result Date: 11/29/2023 CLINICAL DATA:  Shortness of breath EXAM: PORTABLE CHEST 1 VIEW COMPARISON:  10/11/2023, 11/10/2023 FINDINGS: Cardiac shadow is within normal limits. The lungs are well aerated bilaterally. No focal infiltrate or effusion is seen. No bony abnormality is noted. IMPRESSION: No acute abnormality noted. Electronically Signed   By: Oneil Devonshire M.D.   On: 11/29/2023 01:34   CT Chest Wo Contrast Result Date: 11/18/2023 CLINICAL DATA:  Pneumonia, complication suspected, xray done EXAM: CT CHEST WITHOUT CONTRAST TECHNIQUE: Multidetector CT imaging of the chest was performed following the standard protocol without IV contrast. RADIATION DOSE REDUCTION: This exam was performed according to the departmental dose-optimization program which includes automated exposure control, adjustment of the mA and/or kV according to patient size and/or use of  iterative reconstruction technique. COMPARISON:  11/01/2023 and previous FINDINGS: Cardiovascular: Heart size normal. No pericardial effusion. Dilated central pulmonary arteries suggesting pulmonary hypertension. Scattered coronary calcifications. Aortic Atherosclerosis (ICD10-170.0). Mediastinum/Nodes: No mass or adenopathy. Lungs/Pleura: No pleural effusion. No pneumothorax. Pulmonary emphysema. Chronic linear scarring/atelectasis posteriorly at the right lung base. Chronic bronchiectasis with some patchy peripheral scarring in the inferior right middle lobe. Linear scarring/atelectasis in the posterior left upper lobe stable. No new infiltrate or nodule. Upper Abdomen: No acute findings. Musculoskeletal: Mild multilevel cervicothoracic spondylitic change. IMPRESSION: 1. No acute findings. 2. Chronic bronchiectasis and scarring in the right middle lobe. 3. Aortic Atherosclerosis (ICD10-I70.0) and Emphysema (ICD10-J43.9). Electronically Signed   By: JONETTA Faes M.D.   On: 11/18/2023 09:29   DG BONE DENSITY (DXA) Result Date: 11/16/2023 EXAM: DUAL X-RAY ABSORPTIOMETRY (DXA) FOR BONE MINERAL DENSITY 11/15/2023 4:57 pm CLINICAL DATA:  63 year old Female Postmenopausal. Screening for osteoporosis Patient is or has been on glucocorticoid therapy. TECHNIQUE: An axial (e.g., hips, spine) and/or appendicular (e.g., radius) exam was performed, as appropriate, using GE Secretary/administrator at [facility]. Images are obtained for bone mineral density measurement and are not obtained for diagnostic purposes. [id number] Exclusions: Lumbar spine COMPARISON:  None. FINDINGS: Scan quality: Good. LEFT FEMORAL NECK: BMD (in g/cm2): 0.736 T-score: -2.2 Z-score: -0.7 LEFT TOTAL HIP: BMD (in g/cm2): 0.798 T-score: -1.7 Z-score: -0.5 RIGHT FEMORAL NECK: BMD (in g/cm2): 0.778 T-score: -1.9 Z-score: -0.4 RIGHT TOTAL HIP: BMD (in g/cm2): 0.817 T-score: -1.5 Z-score: -0.3 LEFT FOREARM (RADIUS 33%): BMD (in  g/cm2): 0.846 T-score: -0.3 Z-score: 1.0 FRAX 10-YEAR PROBABILITY OF FRACTURE: 10-year fracture risk is performed using the [facility] FRAX calculator based on patient-reported risk factors. Major osteoporotic fracture: 28.8% Hip fracture: 6.0% Other situations known to alter the reliability of the FRAX score should be considered when making treatment decisions, including chronic glucocorticoid use and past treatments. Further guidance on treatment can be found at the [facility]osis Foundation's website https://www.patton.com/. IMPRESSION: Osteopenia based on BMD. Fracture risk is increased. Increased risk is based on low BMD, FRAX calculation. RECOMMENDATIONS: 1. All patients should optimize calcium and vitamin D intake. 2. Consider FDA-approved medical therapies in postmenopausal women and men aged 75 years and older, based on the following: - A hip or vertebral (clinical or morphometric) fracture - T-score less than or equal to -2.5 and secondary causes have been excluded. - Low bone mass (T-score between -1.0 and -2.5) and a 10-year probability of a hip fracture greater than or equal to 3% or a 10-year probability of a major osteoporosis-related fracture greater than or equal to 20% based on the [place] -adapted WHO algorithm. - Clinician judgment and/or patient preferences may indicate treatment for people with 10-year fracture probabilities above or below these levels 3. Patients with diagnosis of osteoporosis or at high risk for fracture should have regular bone mineral density tests. For patients eligible for Medicare, routine testing is allowed once every 2 years. The testing frequency can be increased to one year for patients who have rapidly progressing disease, those who are receiving or discontinuing medical therapy to restore bone mass, or have additional risk factors. Electronically Signed   By: Reyes Phi M.D.   On: 11/16/2023 07:49   CT CORONARY MORPH W/CTA COR W/SCORE W/CA W/CM &/OR  WO/CM Addendum Date: 11/12/2023 ADDENDUM REPORT: 11/12/2023 15:32 EXAM: OVER-READ INTERPRETATION  CT CHEST The following report is an over-read performed by radiologist Dr. Fonda Mom [facility] on 11/12/2023. This over-read does not include interpretation of cardiac or coronary anatomy or pathology. The coronary CTA interpretation by the cardiologist is attached. COMPARISON:  06/22/2023. FINDINGS: Cardiovascular:  See findings discussed in the body of the report. Mediastinum/Nodes: No suspicious adenopathy identified. Imaged mediastinal structures are unremarkable. Lungs/Pleura: Stable right base alveolar process consistent with scarring versus volume loss or consolidation. No pleural effusion or pneumothorax. Upper Abdomen: No acute abnormality. Musculoskeletal: No chest wall abnormality. No acute osseous findings. IMPRESSION: No acute noncardiac abnormality identified. Electronically Signed   By: Fonda Field M.D.   On: 11/12/2023 15:32   Result Date: 11/12/2023 HISTORY: Chest pain/anginal equiv, intermediate CAD risk, treadmill candidate EXAM: Cardiac/Coronary  CT TECHNIQUE: The patient was scanned on a [employer]. PROTOCOL: A 120 kV prospective scan was triggered in the descending thoracic aorta at 111 HU's. Axial non-contrast 3 mm slices were carried out through the heart. The data set was analyzed on a dedicated work station and scored using the Agatston method. Gantry rotation speed was 250 msecs and collimation was .6 mm. Beta blockade and 0.8 mg of sl NTG was  given. The 3D data set was reconstructed in 5% intervals of the 35-75 % of the R-R cycle. Systolic and diastolic phases were analyzed on a dedicated work station using MPR, MIP and VRT modes. The patient received contrast: 100mL OMNIPAQUE  IOHEXOL  350 MG/ML SOLN. FINDINGS: Image quality: Good Noise artifact is: Limited Coronary calcium score is 235, which places the patient in the 92nd percentile for age and sex  matched control. Coronary arteries: Normal coronary origins.  Right dominance. Right Coronary Artery: Angulation of the ostial RCA without stenosis. Minimal plaque proximal and distal RCA, <25% stenosis. Left Main Coronary Artery: Minimal plaque proximal and distal LM, <25% stenosis. Left Anterior Descending Coronary Artery: Mild mixed plaque ostial LAD, with low attenuation plaque, 25-49% stenosis. Otherwise, minimal plaque proximal and mid LAD, <25% stenosis. Ramus intermedius: Minimal plaque proximal RI, <25% stenosis. Left Circumflex Artery: Minimal plaque proximal LCx, <25% stenosis. Aorta: Normal size, 35 mm at the mid ascending aorta (level of the PA bifurcation) measured double oblique. Aortic Valve: No calcifications. Other findings: Normal pulmonary vein drainage into the left atrium. Normal left atrial appendage without thrombus. Mild dilation of main pulmonary artery, 33 mm. Please see separate report from [facility] for non-cardiac findings. IMPRESSION: 1. Mild CAD in ostial LAD with low attenuation plaque (high risk plaque feature), 25-49% stenosis, CADRADS 2. 2. Total plaque volume 293 mm3 which is 72nd percentile for age- and sex-matched controls (calcified plaque 52 mm3; non-calcified plaque 241 mm3). TPV is severe. 3. Coronary calcium score is 235, which places the patient in the 92nd percentile for age and sex matched control. 4. Normal coronary origins with right dominance. 5. Mild dilation of main pulmonary artery, 33 mm, may indicate elevated pulmonary pressures. RECOMMENDATIONS: CAD-RADS 2. Mild non-obstructive CAD (25-49%). Consider non-atherosclerotic causes of chest pain. Consider preventive therapy and risk factor modification. Electronically Signed: By: Soyla Merck M.D. On: 11/01/2023 17:14    Microbiology: Results for orders placed or performed during the hospital encounter of 11/29/23  Resp panel by RT-PCR (RSV, Flu A&B, Covid) Anterior Nasal Swab     Status: None    Collection Time: 11/29/23  1:18 AM   Specimen: Anterior Nasal Swab  Result Value Ref Range Status   SARS Coronavirus 2 by RT PCR NEGATIVE NEGATIVE Final    Comment: (NOTE) SARS-CoV-2 target nucleic acids are NOT DETECTED.  The SARS-CoV-2 RNA is generally detectable in upper respiratory specimens during the acute phase of infection. The lowest concentration of SARS-CoV-2 viral copies this assay can detect is 138 copies/mL. A negative result does not preclude SARS-Cov-2 infection and should not be used as the sole basis for treatment or other patient management decisions. A negative result may occur with  improper specimen collection/handling, submission of specimen other than nasopharyngeal swab, presence of viral mutation(s) within the areas targeted by this assay, and inadequate number of viral copies(<138 copies/mL). A negative result must be combined with clinical observations, patient history, and epidemiological information. The expected result is Negative.  Fact Sheet for Patients:  BloggerCourse.com  Fact Sheet for Healthcare Providers:  SeriousBroker.it  This test is no t yet approved or cleared by the [place]  FDA and  has been authorized for detection and/or diagnosis of SARS-CoV-2 by FDA under an Emergency Use Authorization (EUA). This EUA will remain  in effect (meaning this test can be used) for the duration of the COVID-19 declaration under Section 564(b)(1) of the Act, 21 U.S.C.section 360bbb-3(b)(1), unless the authorization is terminated  or revoked sooner.  Influenza A by PCR NEGATIVE NEGATIVE Final   Influenza B by PCR NEGATIVE NEGATIVE Final    Comment: (NOTE) The Xpert Xpress SARS-CoV-2/FLU/RSV plus assay is intended as an aid in the diagnosis of influenza from Nasopharyngeal swab specimens and should not be used as a sole basis for treatment. Nasal washings and aspirates are unacceptable for  Xpert Xpress SARS-CoV-2/FLU/RSV testing.  Fact Sheet for Patients: BloggerCourse.com  Fact Sheet for Healthcare Providers: SeriousBroker.it  This test is not yet approved or cleared by the [place]  FDA and has been authorized for detection and/or diagnosis of SARS-CoV-2 by FDA under an Emergency Use Authorization (EUA). This EUA will remain in effect (meaning this test can be used) for the duration of the COVID-19 declaration under Section 564(b)(1) of the Act, 21 U.S.C. section 360bbb-3(b)(1), unless the authorization is terminated or revoked.     Resp Syncytial Virus by PCR NEGATIVE NEGATIVE Final    Comment: (NOTE) Fact Sheet for Patients: BloggerCourse.com  Fact Sheet for Healthcare Providers: SeriousBroker.it  This test is not yet approved or cleared by the [place]  FDA and has been authorized for detection and/or diagnosis of SARS-CoV-2 by FDA under an Emergency Use Authorization (EUA). This EUA will remain in effect (meaning this test can be used) for the duration of the COVID-19 declaration under Section 564(b)(1) of the Act, 21 U.S.C. section 360bbb-3(b)(1), unless the authorization is terminated or revoked.  Performed at [facility], [address] [place], [place] [address]   MRSA Next Gen by PCR, Nasal     Status: None   Collection Time: 11/29/23  3:50 AM   Specimen: Nasal Mucosa; Nasal Swab  Result Value Ref Range Status   MRSA by PCR Next Gen NOT DETECTED NOT DETECTED Final    Comment: (NOTE) The GeneXpert MRSA Assay (FDA approved for NASAL specimens only), is one component of a comprehensive MRSA colonization surveillance program. It is not intended to diagnose MRSA infection nor to guide or monitor treatment for MRSA infections. Test performance is not FDA approved in patients less than 76 years old. Performed at [facility], [address] [place], [place] [address]     Labs: CBC: Recent Labs  Lab 11/29/23 0118 11/30/23 0304 12/01/23 0410  WBC 9.7 10.6* 10.3  NEUTROABS 6.6  --   --   HGB 10.7* 11.0* 11.0*  HCT 34.6* 35.7* 35.1*  MCV 88.7 91.3 90.0  PLT 259 245 250   Basic Metabolic Panel: Recent Labs  Lab 11/29/23 0546 11/29/23 1752 11/30/23 0304 11/30/23 1937 12/01/23 0410  NA 133* 138 139 136 135  K 4.0 4.2 4.3 4.5 4.5  CL 98 103 103 100 98  CO2 25 26 28 28 30   GLUCOSE 132* 130* 84 179* 131*  BUN 10 14 16 18 18   CREATININE 0.66 0.76 0.59 0.54 0.50  CALCIUM 8.2* 9.1 9.1 9.1 9.3   Liver Function Tests: Recent Labs  Lab 11/29/23 0118  AST 18  ALT 17  ALKPHOS 49  BILITOT 0.8  PROT 6.8  ALBUMIN 3.9   CBG: No results for input(s): GLUCAP in the last 168 hours.  Discharge time spent: 38 minutes.  Signed: Elgie Butter, MD [facility] 12/01/2023

## 2023-12-01 NOTE — Progress Notes (Signed)
 Mobility Specialist - Progress Note   12/01/23 0951  Oxygen  Therapy  SpO2 (!) 84 %  O2 Device Room Air  Patient Activity (if Appropriate) Ambulating  Mobility  Activity Ambulated independently to bathroom  Level of Assistance Independent after set-up  Assistive Device None  Distance Ambulated (ft) 20 ft  Activity Response Tolerated well  Mobility Referral Yes  Mobility visit 1 Mobility  Mobility Specialist Start Time (ACUTE ONLY) [id number]  Mobility Specialist Stop Time (ACUTE ONLY) 0950  Mobility Specialist Time Calculation (min) (ACUTE ONLY) 12 min   Nurse requested Mobility Specialist to perform oxygen  saturation test with pt which includes removing pt from oxygen  both at rest and while ambulating.  Below are the results from that testing.     Patient Saturations on Room Air while Ambulating = sp02 85% .   Patient Saturations on 5 Liters of oxygen  while Ambulating = sp02 94%  At end of testing pt left in room on 5  Liters of oxygen .  Reported results to nurse. Pt received in bed requesting assistance to the bathroom.  and agreeable to mobility. Upon return, pt desat to 84%. Placed on 5L allowing SpO2 to come back up to 94%. No complaints during session. Pt to bed after session with all needs met.   [facility]

## 2023-12-01 NOTE — Progress Notes (Signed)
 SATURATION QUALIFICATIONS: (This note is used to comply with regulatory documentation for home oxygen )  Patient Saturations on Room Air at Rest = 95%  Patient Saturations on Room Air while Ambulating = 85%  Patient Saturations on 5 Liters of oxygen  while Ambulating = 94%

## 2023-12-20 ENCOUNTER — Ambulatory Visit: Payer: Self-pay | Admitting: Pulmonary Disease

## 2023-12-20 NOTE — Telephone Encounter (Signed)
 Patient was seen at PCP office this morning.  Nothing further needed.

## 2023-12-20 NOTE — Telephone Encounter (Signed)
 ATC patient x1.  Left detailed message for patient to see care at ER as her symptoms require evaluation in the ER.  Advised to call back with any further questions.

## 2023-12-20 NOTE — Telephone Encounter (Signed)
 FYI Only or Action Required?: Action required by provider: refusing hospital though advised strongly, pt requesting appt, needs call back ASAP.  Patient is followed in Pulmonology for COPD, emphysema, chronic respiratory failure, last seen on 10/23/2023 by Mannam, Praveen, MD.  Called Nurse Triage reporting Shortness of Breath, chest congestion, severe wheezing, Cough, no relief from inhalers/nebulizers, and low oxygen  levels.  Symptoms began yesterday.  Interventions attempted: Rescue inhaler, Nebulizer treatments, and Home oxygen  use.  Symptoms are: rapidly worsening.  Triage Disposition: Go to ED Now (Notify PCP) - Advised 911 if struggling to breathe or any worsening at all  Patient/caregiver understands and will follow disposition?: No, refuses disposition     Copied from CRM #[id number]. Topic: Clinical - Red Word Triage >> Dec 20, 2023  8:37 AM Joesph PARAS wrote: Red Word that prompted transfer to Nurse Triage: Sudden onset wheezing and mucus coming up, SOB Reason for Disposition  [1] MODERATE difficulty breathing (e.g., speaks in phrases, SOB even at rest, pulse 100-120) AND [2] NEW-onset or WORSE than normal  Answer Assessment - Initial Assessment Questions E2C2 Pulmonary Triage - Initial Assessment Questions Chief Complaint (e.g., cough, sob, wheezing, fever, chills, sweat or additional symptoms) *Go to specific symptom protocol after initial questions. Much more SOB than usual Wheezing with every breath with inhale and exhale Sounds tight and labored Cough No chest pain, dizziness, fever, weakness more than usual  How long have symptoms been present? yesterday  Have you used your inhalers/maintenance medication? Yes If yes, What medications? Nebulizer and inhaler, used everything, no relief, last used 8 am this morning  If inhaler, ask How many puffs and how often? Note: Review instructions on medication in the chart. 3x/day albuterol  inhaler, nebulizer every 5  hours  OXYGEN : Do you wear supplemental oxygen ? Yes If yes, How many liters are you supposed to use? Use oxygen  at night only  Do you monitor your oxygen  levels? Yes If yes, What is your reading (oxygen  level) today? 87%  What is your usual oxygen  saturation reading?  (Note: Pulmonary O2 sats should be 90% or greater) 90%   6. CARDIAC HISTORY: Do you have any history of heart disease? (e.g., heart attack, angina, bypass surgery, angioplasty)      significant  7. LUNG HISTORY: Do you have any history of lung disease?  (e.g., pulmonary embolus, asthma, emphysema)      Significant   Strongly advised pt go to ED with severity of symptoms, pt still refusing, wants pulm appt. Sending message to pulm for call back with further recommendations. Advised call 911 if any worsening at all.  Protocols used: Breathing Difficulty-A-AH

## 2023-12-22 IMAGING — DX DG CHEST 2V
2 series · 2 of 2 positions shown · non-contrast
Comparison: Chest radiograph 03/18/2021, CT chest 04/19/2021

CLINICAL DATA: Wheezing

EXAM:
CHEST - 2 VIEW

[chest pa]
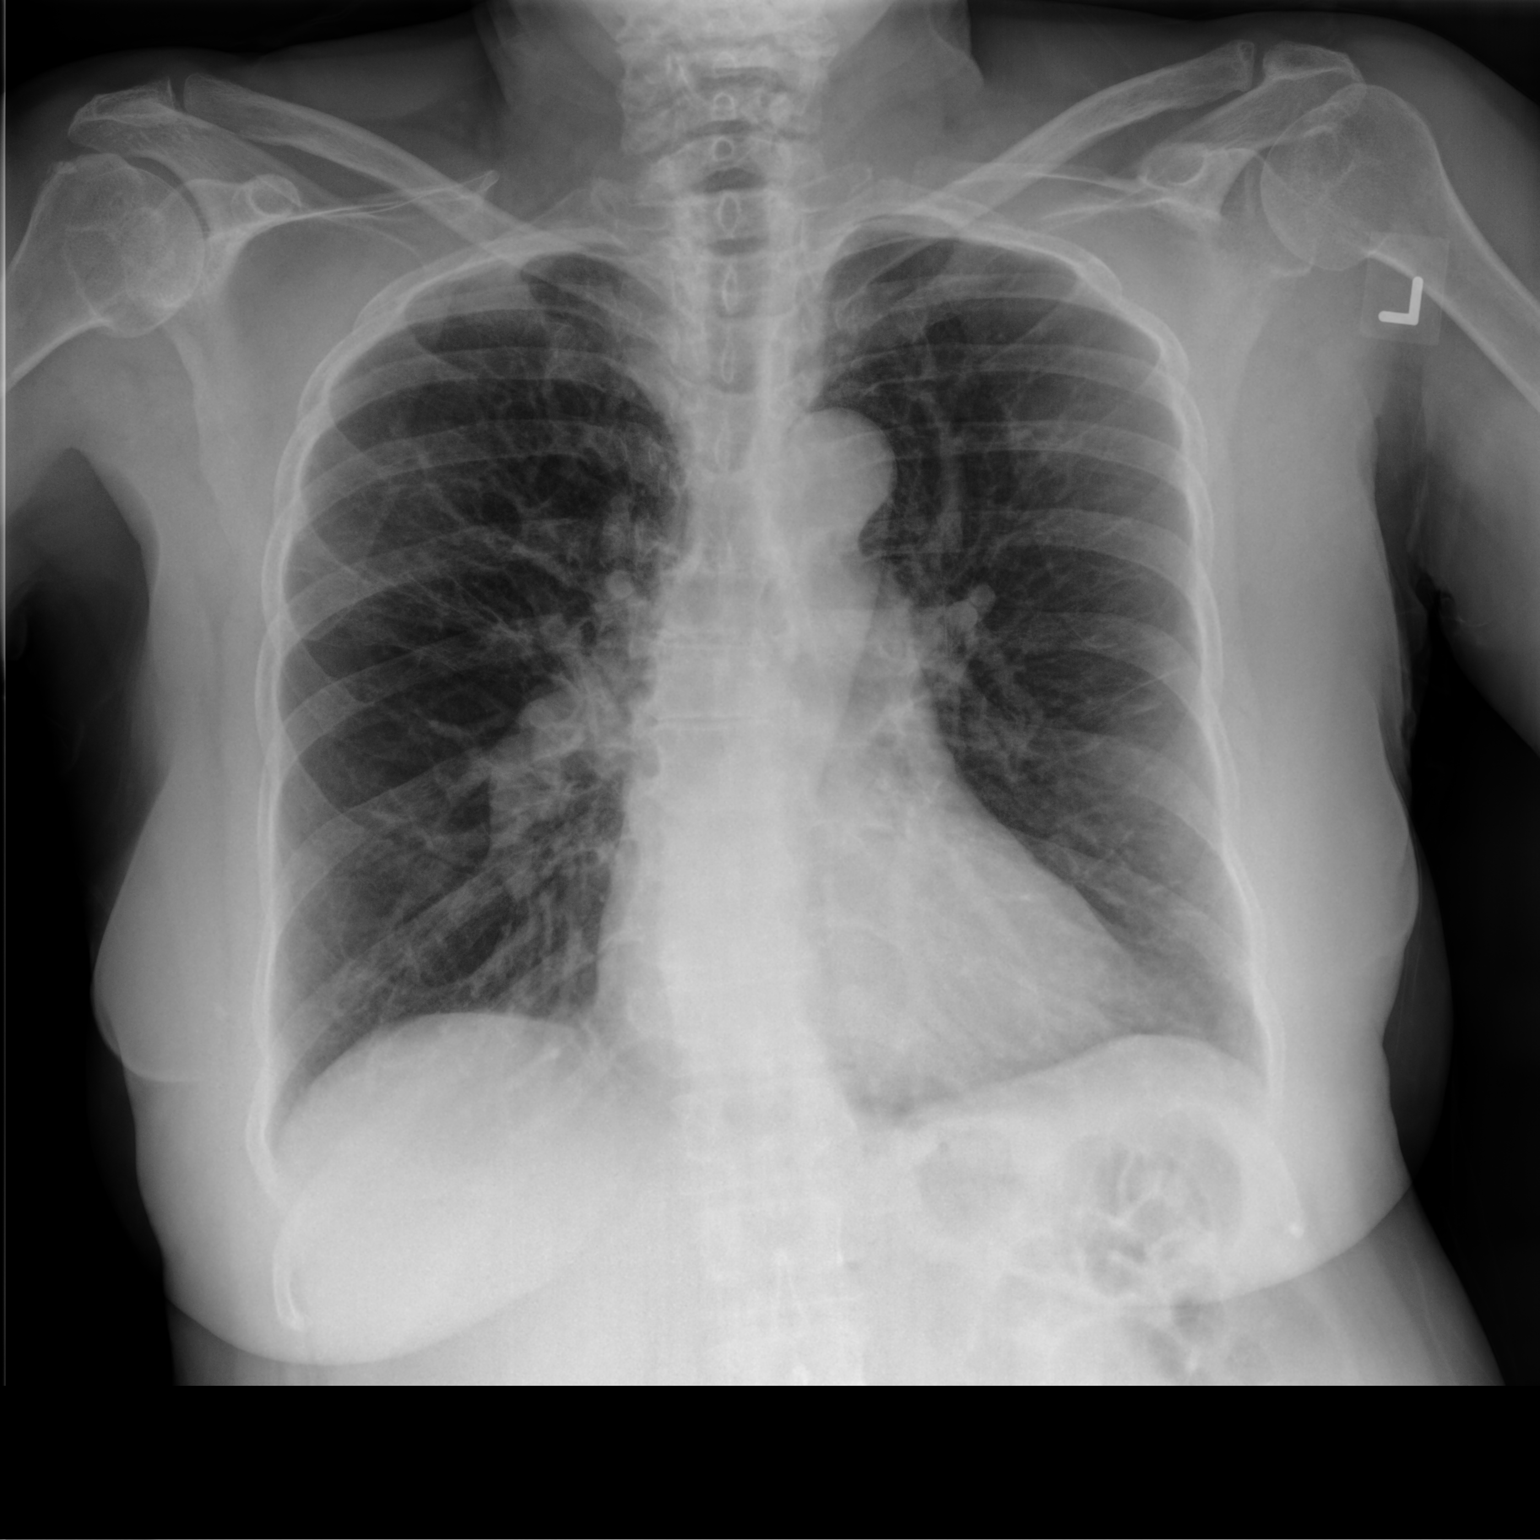

[chest lat]
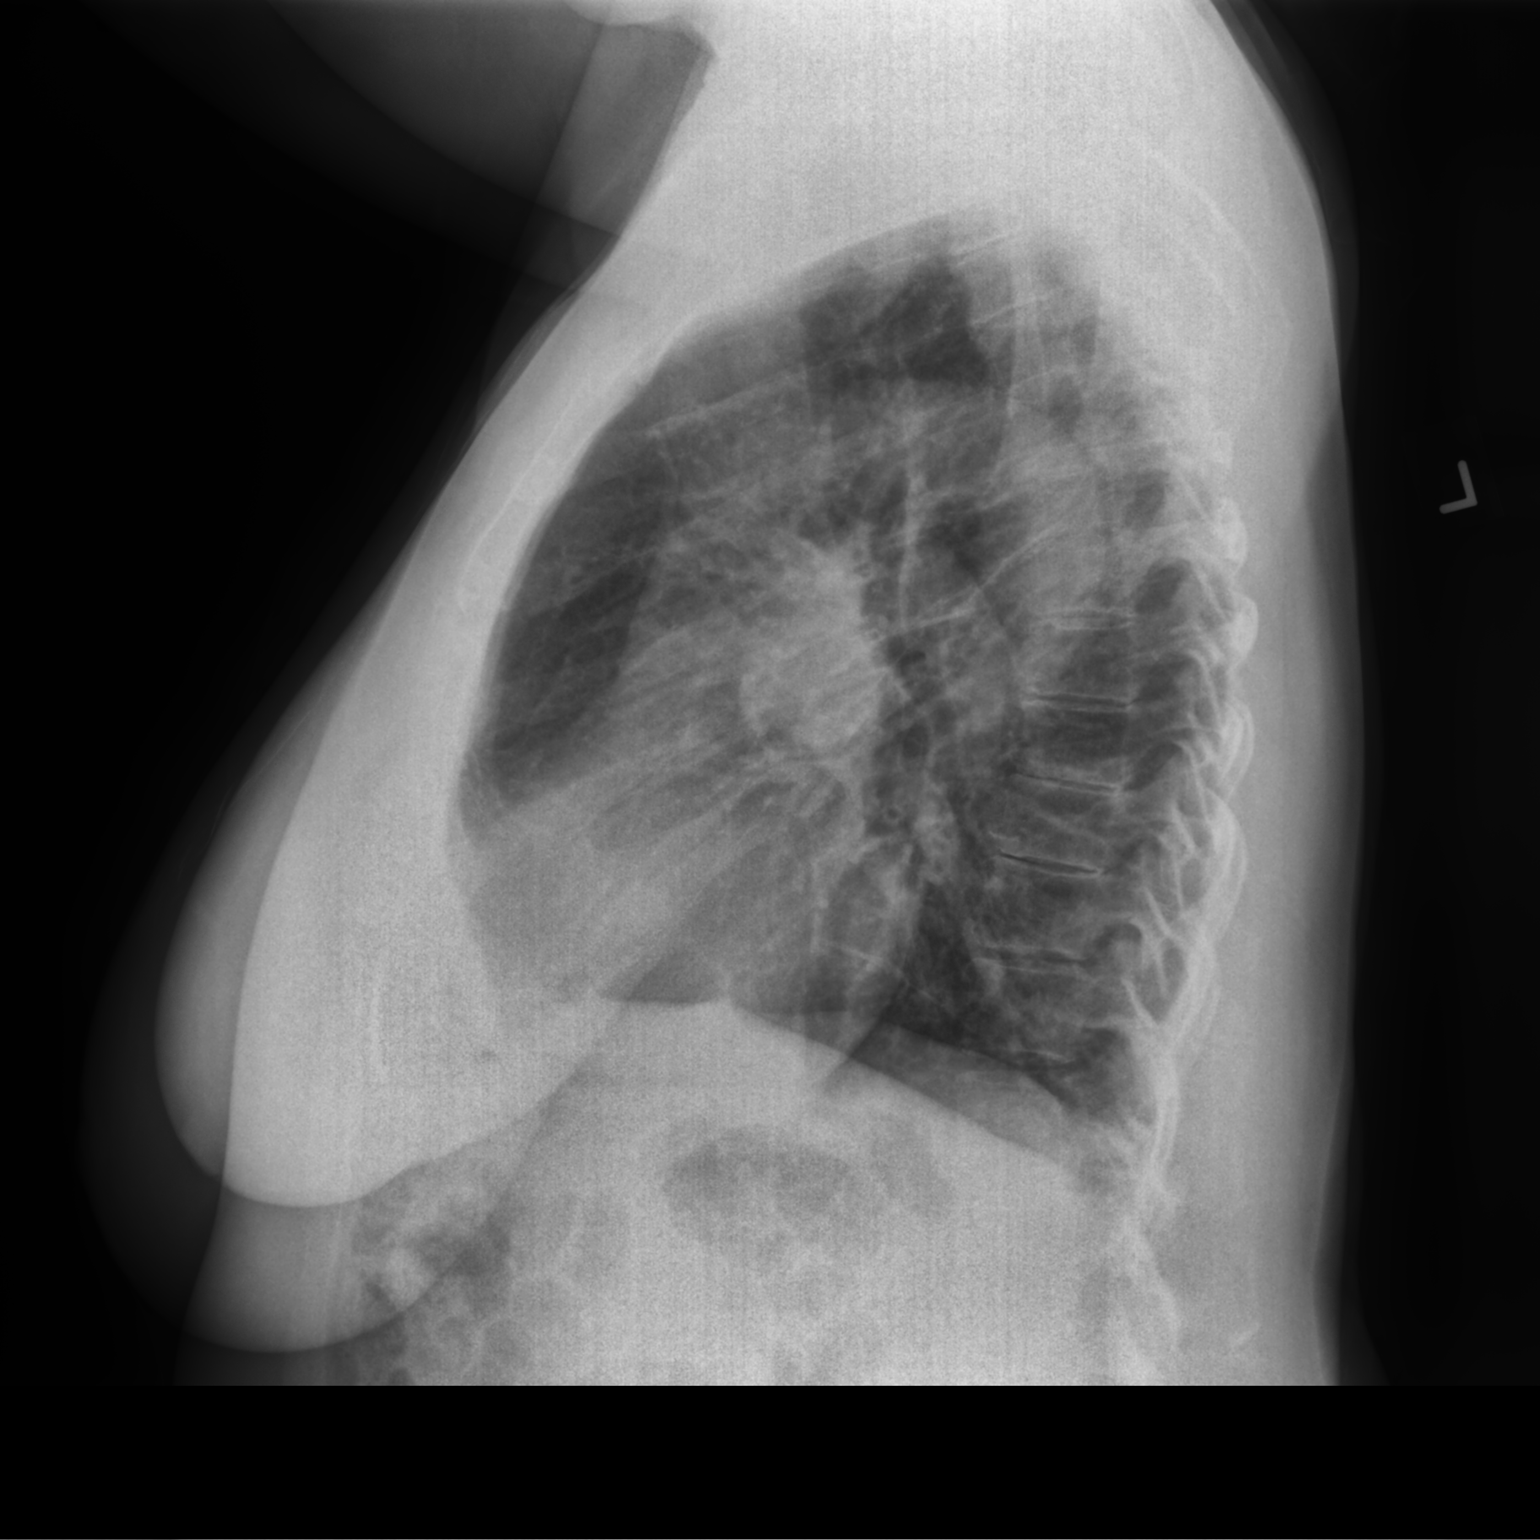

[2 of 2 positions shown; findings below may reference images not displayed]

FINDINGS: The cardiomediastinal silhouette is normal.

There is no focal consolidation or pulmonary edema. Coarse
interstitial markings primarily in the right lung are not
significantly changed likely reflecting scar. The lungs are
hyperinflated consistent with underlying emphysema. There is no
pleural effusion or pneumothorax.

There is no acute osseous abnormality.
IMPRESSION: Stable chest with no radiographic evidence of acute cardiopulmonary
process.

## 2023-12-29 ENCOUNTER — Other Ambulatory Visit (HOSPITAL_BASED_OUTPATIENT_CLINIC_OR_DEPARTMENT_OTHER): Payer: Self-pay

## 2024-01-04 ENCOUNTER — Telehealth: Payer: Self-pay

## 2024-01-04 ENCOUNTER — Other Ambulatory Visit (HOSPITAL_COMMUNITY): Payer: Self-pay

## 2024-01-04 DIAGNOSIS — J449 Chronic obstructive pulmonary disease, unspecified: Secondary | ICD-10-CM

## 2024-01-04 NOTE — Telephone Encounter (Signed)
 Most recent abs eos 200. Nucala for eosinophilic COPD has greater likelihood of being approved.  Dupixent  clinical trial BOREAS for eosinophilic COPD did not include patients with eos< 300

## 2024-01-04 NOTE — Telephone Encounter (Signed)
 Pt was to be started on Dupixent , however pt would not qualify through her primary insurance due to eosinophil levels being less than the minimum of 300. Decision was made to switch to Nucala as minimum Eos is only 150.  Submitted a Prior Authorization request to [facility] for NUCALA via CoverMyMeds. Will update once we receive a response.  Key: [id number]  Submitted a Prior Authorization request to [facility] for NUCALA via CoverMyMeds. Will update once we receive a response.  Key: [id number]

## 2024-01-05 NOTE — Telephone Encounter (Signed)
 Submitted Patient Assistance Application to [facility] for [place] along with provider portion, patient portion, PA denial letter, medication list, and insurance card copy. This was at the request of Adina Lesches ([facility] drug rep) in order to get their cases into the system so that they can work with [employer].   Phone #: [phone] Fax #: [phone]

## 2024-01-05 NOTE — Telephone Encounter (Signed)
 Received a fax regarding Prior Authorization from [facility] for NUCALA. Authorization has been DENIED because COPD is apparently not an approved indication in their system (despite having attached official literature stating otherwise in my PA submission).   I have reached out to Adina Lesches with [employer] and brought this to his attention, he advised that we completed and submit the enrollment application to [facility] [place] along with the insurance denial letter. He also states that [facility]) will be getting in contact with us  on Monday to further assist navigating this oversight on the insurance's side.

## 2024-01-08 NOTE — Telephone Encounter (Signed)
 Received fax from [facility] confirming receipt of enrollment form

## 2024-01-10 NOTE — Telephone Encounter (Signed)
 Received a fax regarding Prior Authorization from [facility] for NUCALA. Authorization has been DENIED because patient has not tried and failed the formulary alternatives, however the alternatives listed are Fasenra and Xolair, neither of which are approved for COPD.  Appeal Phone# [phone]

## 2024-01-11 NOTE — Telephone Encounter (Signed)
 Appeal letter drafted and submitted to [facility] along with [facility]'s own Specialty Guideline Management document (which outlines the coverage criteria for Nucala as treatment for COPD), as well as supporting chart notes and lab results. Will await determination.  Phone#: [phone] Fax#: [phone]

## 2024-01-11 NOTE — Telephone Encounter (Signed)
 Contacted [employer] and scheduled a peer-to-peer for 8/29 between 10am and 12pm EST to discuss reconsideration of initial determination on the grounds that Xolair and Fasenra are NOT approved by the FDA for COPD.  Ticket# [id number]

## 2024-01-14 ENCOUNTER — Other Ambulatory Visit: Payer: Self-pay | Admitting: Pulmonary Disease

## 2024-01-17 NOTE — Telephone Encounter (Addendum)
 Despite being scheduled for a Peer-to-Peer review, I never received any call to discuss. Reached back out to [facility] and learned that the case had actually been marked closed as of 8/28 and the initial denial had been overturned. Approval letter received and sent to scan center.  Approved from 01/05/24 until further notice Authorization# [id number]  We have not received any update from [facility] at this time.

## 2024-01-23 ENCOUNTER — Telehealth: Payer: Self-pay | Admitting: Cardiology

## 2024-01-23 ENCOUNTER — Other Ambulatory Visit (HOSPITAL_BASED_OUTPATIENT_CLINIC_OR_DEPARTMENT_OTHER): Payer: Self-pay

## 2024-01-23 DIAGNOSIS — I700 Atherosclerosis of aorta: Secondary | ICD-10-CM

## 2024-01-23 DIAGNOSIS — I2510 Atherosclerotic heart disease of native coronary artery without angina pectoris: Secondary | ICD-10-CM

## 2024-01-23 DIAGNOSIS — E782 Mixed hyperlipidemia: Secondary | ICD-10-CM

## 2024-01-23 MED ORDER — REPATHA SURECLICK 140 MG/ML ~~LOC~~ SOAJ: 140.0000 mg | SUBCUTANEOUS | 0 refills | Status: AC

## 2024-01-23 NOTE — Telephone Encounter (Signed)
*  STAT* If patient is at the pharmacy, call can be transferred to refill team.   1. Which medications need to be refilled? (please list name of each medication and dose if known)   Evolocumab  (REPATHA  SURECLICK) 140 MG/ML SOAJ    New pharmacy and pt states her insurance will only cover 90 day supply     4. Which pharmacy/location (including street and city if local pharmacy) is medication to be sent to?  [id number] [place] - [address] Phone: [phone]  Fax: [phone]       5. Do they need a 30 day or 90 day supply? 90

## 2024-01-24 NOTE — Telephone Encounter (Signed)
 Copay card registered and sent to pt.

## 2024-01-24 NOTE — Telephone Encounter (Signed)
 Pt c/o medication issue:  1. Name of Medication:   Evolocumab  (REPATHA  SURECLICK) 140 MG/ML SOAJ   2. How are you currently taking this medication (dosage and times per day)?   3. Are you having a reaction (difficulty breathing--STAT)?   4. What is your medication issue?   Patient stated she cannot afford this medication and wants to get a GoodRX coupon or assistance getting this medication.

## 2024-01-30 ENCOUNTER — Other Ambulatory Visit (HOSPITAL_COMMUNITY): Payer: Self-pay

## 2024-01-30 MED ORDER — NUCALA 100 MG/ML ~~LOC~~ SOAJ
SUBCUTANEOUS | 0 refills | Status: DC
  Filled 2024-02-09: qty 1, 28d supply, fill #0

## 2024-01-30 NOTE — Telephone Encounter (Signed)
 Received notification from [facility] regarding an appeal for NUCALA . Authorization has been APPROVED from 01/29/24 to 01/28/25. Approval letter sent to scan center.  Per test claim, copay for 28 days supply is $0 when ran through both insurances with [facility] as primary.  Patient can fill through [facility]: [phone]   Phone # [phone]

## 2024-01-30 NOTE — Telephone Encounter (Signed)
 Scheduled for Nucala  new start on 02/15/24, patient prefers to come the say day as next visit with Dr. Theophilus.   She is aware she will receive onboarding call from [place] or [place].   Rx triaged to [facility].  Aleck Puls, PharmD, BCPS Clinical Pharmacist  [facility]

## 2024-02-02 ENCOUNTER — Observation Stay (HOSPITAL_COMMUNITY)
Admission: EM | Admit: 2024-02-02 | Discharge: 2024-02-03 | Disposition: A | Discharge status: Home / Self Care | Attending: Internal Medicine | Admitting: Internal Medicine

## 2024-02-02 ENCOUNTER — Telehealth: Payer: Self-pay | Admitting: Pulmonary Disease

## 2024-02-02 ENCOUNTER — Emergency Department (HOSPITAL_COMMUNITY)

## 2024-02-02 ENCOUNTER — Encounter (HOSPITAL_COMMUNITY): Payer: Self-pay | Admitting: Internal Medicine

## 2024-02-02 ENCOUNTER — Other Ambulatory Visit: Payer: Self-pay

## 2024-02-02 DIAGNOSIS — E785 Hyperlipidemia, unspecified: Secondary | ICD-10-CM | POA: Diagnosis not present

## 2024-02-02 DIAGNOSIS — D649 Anemia, unspecified: Secondary | ICD-10-CM | POA: Insufficient documentation

## 2024-02-02 DIAGNOSIS — Z0289 Encounter for other administrative examinations: Secondary | ICD-10-CM

## 2024-02-02 DIAGNOSIS — K219 Gastro-esophageal reflux disease without esophagitis: Secondary | ICD-10-CM | POA: Diagnosis not present

## 2024-02-02 DIAGNOSIS — I10 Essential (primary) hypertension: Secondary | ICD-10-CM | POA: Diagnosis present

## 2024-02-02 DIAGNOSIS — J441 Chronic obstructive pulmonary disease with (acute) exacerbation: Secondary | ICD-10-CM | POA: Diagnosis not present

## 2024-02-02 DIAGNOSIS — J9611 Chronic respiratory failure with hypoxia: Secondary | ICD-10-CM | POA: Diagnosis not present

## 2024-02-02 DIAGNOSIS — R0603 Acute respiratory distress: Secondary | ICD-10-CM | POA: Diagnosis present

## 2024-02-02 LAB — IRON AND TIBC
Iron: 22 ug/dL — ABNORMAL LOW (ref 28–170)
Saturation Ratios: 5 % — ABNORMAL LOW (ref 10.4–31.8)
TIBC: 442 ug/dL (ref 250–450)
UIBC: 420 ug/dL

## 2024-02-02 LAB — VITAMIN B12: Vitamin B-12: 339 pg/mL (ref 180–914)

## 2024-02-02 LAB — RETICULOCYTES
Immature Retic Fract: 24.1 % — ABNORMAL HIGH (ref 2.3–15.9)
RBC.: 3.45 MIL/uL — ABNORMAL LOW (ref 3.87–5.11)
Retic Count, Absolute: 43.8 K/uL (ref 19.0–186.0)
Retic Ct Pct: 1.3 % (ref 0.4–3.1)

## 2024-02-02 LAB — RESPIRATORY PANEL BY PCR

## 2024-02-02 LAB — CBC WITH DIFFERENTIAL/PLATELET
Abs Immature Granulocytes: 0.04 K/uL (ref 0.00–0.07)
Basophils Absolute: 0 K/uL (ref 0.0–0.1)
Basophils Relative: 0 %
Eosinophils Absolute: 0 K/uL (ref 0.0–0.5)
Eosinophils Relative: 1 %
HCT: 31.6 % — ABNORMAL LOW (ref 36.0–46.0)
Hemoglobin: 9.2 g/dL — ABNORMAL LOW (ref 12.0–15.0)
Immature Granulocytes: 1 %
Lymphocytes Relative: 21 %
Lymphs Abs: 1.8 K/uL (ref 0.7–4.0)
MCH: 26.5 pg (ref 26.0–34.0)
MCHC: 29.1 g/dL — ABNORMAL LOW (ref 30.0–36.0)
MCV: 91.1 fL (ref 80.0–100.0)
Monocytes Absolute: 0.9 K/uL (ref 0.1–1.0)
Monocytes Relative: 10 %
Neutro Abs: 5.7 K/uL (ref 1.7–7.7)
Neutrophils Relative %: 67 %
Platelets: 280 K/uL (ref 150–400)
RBC: 3.47 MIL/uL — ABNORMAL LOW (ref 3.87–5.11)
RDW: 14.6 % (ref 11.5–15.5)
WBC: 8.4 K/uL (ref 4.0–10.5)
nRBC: 0 % (ref 0.0–0.2)

## 2024-02-02 LAB — EXPECTORATED SPUTUM ASSESSMENT W GRAM STAIN, RFLX TO RESP C

## 2024-02-02 LAB — BASIC METABOLIC PANEL WITH GFR
Anion gap: 14 (ref 5–15)
BUN: 9 mg/dL (ref 8–23)
CO2: 30 mmol/L (ref 22–32)
Calcium: 8.8 mg/dL — ABNORMAL LOW (ref 8.9–10.3)
Chloride: 101 mmol/L (ref 98–111)
Creatinine, Ser: 0.51 mg/dL (ref 0.44–1.00)
GFR, Estimated: >60 mL/min (ref >60)
Glucose, Bld: 92 mg/dL (ref 70–99)
Potassium: 3.5 mmol/L (ref 3.5–5.1)
Sodium: 144 mmol/L (ref 135–145)

## 2024-02-02 LAB — RESP PANEL BY RT-PCR (RSV, FLU A&B, COVID)  RVPGX2
Influenza A by PCR: NEGATIVE
Influenza B by PCR: NEGATIVE
Resp Syncytial Virus by PCR: NEGATIVE
SARS Coronavirus 2 by RT PCR: NEGATIVE

## 2024-02-02 LAB — PROCALCITONIN: Procalcitonin: <0.1 ng/mL

## 2024-02-02 LAB — POC OCCULT BLOOD, ED: Fecal Occult Bld: NEGATIVE

## 2024-02-02 LAB — PRO BRAIN NATRIURETIC PEPTIDE: Pro Brain Natriuretic Peptide: <50 pg/mL (ref <300.0)

## 2024-02-02 LAB — FERRITIN: Ferritin: 10 ng/mL — ABNORMAL LOW (ref 11–307)

## 2024-02-02 LAB — FOLATE: Folate: >20 ng/mL (ref >5.9)

## 2024-02-02 MED ORDER — ONDANSETRON HCL 4 MG PO TABS: 4.0000 mg | ORAL_TABLET | Freq: Four times a day (QID) | ORAL | Status: DC | PRN

## 2024-02-02 MED ORDER — METHYLPREDNISOLONE SODIUM SUCC 125 MG IJ SOLR
125.0000 mg | Freq: Once | INTRAMUSCULAR | Status: AC
  Administered 2024-02-02: 125 mg via INTRAVENOUS
  Filled 2024-02-02: qty 2

## 2024-02-02 MED ORDER — IPRATROPIUM-ALBUTEROL 0.5-2.5 (3) MG/3ML IN SOLN: 3.0000 mL | Freq: Once | RESPIRATORY_TRACT | Status: AC

## 2024-02-02 MED ORDER — PANTOPRAZOLE SODIUM 40 MG PO TBEC
40.0000 mg | DELAYED_RELEASE_TABLET | Freq: Every day | ORAL | Status: DC
  Administered 2024-02-02 – 2024-02-03 (×2): 40 mg via ORAL
  Filled 2024-02-02 (×2): qty 1

## 2024-02-02 MED ORDER — PREDNISONE 20 MG PO TABS
40.0000 mg | ORAL_TABLET | Freq: Every day | ORAL | Status: DC
  Administered 2024-02-03: 40 mg via ORAL
  Filled 2024-02-02: qty 2

## 2024-02-02 MED ORDER — ALBUTEROL SULFATE (2.5 MG/3ML) 0.083% IN NEBU
5.0000 mg | INHALATION_SOLUTION | Freq: Once | RESPIRATORY_TRACT | Status: AC
  Administered 2024-02-02: 5 mg via RESPIRATORY_TRACT
  Filled 2024-02-02: qty 6

## 2024-02-02 MED ORDER — ONDANSETRON HCL 4 MG/2ML IJ SOLN: 4.0000 mg | Freq: Four times a day (QID) | INTRAMUSCULAR | Status: DC | PRN

## 2024-02-02 MED ORDER — ARFORMOTEROL TARTRATE 15 MCG/2ML IN NEBU
15.0000 ug | INHALATION_SOLUTION | Freq: Two times a day (BID) | RESPIRATORY_TRACT | Status: DC
  Administered 2024-02-02 – 2024-02-03 (×2): 15 ug via RESPIRATORY_TRACT
  Filled 2024-02-02 (×2): qty 2

## 2024-02-02 MED ORDER — IPRATROPIUM-ALBUTEROL 0.5-2.5 (3) MG/3ML IN SOLN
RESPIRATORY_TRACT | Status: AC
  Filled 2024-02-02: qty 3

## 2024-02-02 MED ORDER — NITROGLYCERIN 0.4 MG SL SUBL: 0.4000 mg | SUBLINGUAL_TABLET | SUBLINGUAL | Status: DC | PRN

## 2024-02-02 MED ORDER — FLUTICASONE PROPIONATE 50 MCG/ACT NA SUSP
2.0000 | Freq: Two times a day (BID) | NASAL | Status: DC
  Administered 2024-02-02 – 2024-02-03 (×2): 2 via NASAL
  Filled 2024-02-02: qty 16

## 2024-02-02 MED ORDER — ENOXAPARIN SODIUM 40 MG/0.4ML IJ SOSY
40.0000 mg | PREFILLED_SYRINGE | INTRAMUSCULAR | Status: DC
  Administered 2024-02-02: 40 mg via SUBCUTANEOUS
  Filled 2024-02-02: qty 0.4

## 2024-02-02 MED ORDER — BUDESONIDE 0.25 MG/2ML IN SUSP
0.2500 mg | Freq: Two times a day (BID) | RESPIRATORY_TRACT | Status: DC
  Administered 2024-02-02 – 2024-02-03 (×2): 0.25 mg via RESPIRATORY_TRACT
  Filled 2024-02-02 (×2): qty 2

## 2024-02-02 MED ORDER — MAGNESIUM SULFATE 2 GM/50ML IV SOLN
2.0000 g | Freq: Once | INTRAVENOUS | Status: AC
  Administered 2024-02-02: 2 g via INTRAVENOUS
  Filled 2024-02-02: qty 50

## 2024-02-02 MED ORDER — POTASSIUM CHLORIDE CRYS ER 20 MEQ PO TBCR
40.0000 meq | EXTENDED_RELEASE_TABLET | Freq: Once | ORAL | Status: AC
  Administered 2024-02-02: 40 meq via ORAL
  Filled 2024-02-02: qty 2

## 2024-02-02 MED ORDER — IPRATROPIUM-ALBUTEROL 0.5-2.5 (3) MG/3ML IN SOLN
3.0000 mL | Freq: Four times a day (QID) | RESPIRATORY_TRACT | Status: DC
  Administered 2024-02-02 (×2): 3 mL via RESPIRATORY_TRACT
  Filled 2024-02-02: qty 3

## 2024-02-02 MED ORDER — REVEFENACIN 175 MCG/3ML IN SOLN
175.0000 ug | Freq: Every day | RESPIRATORY_TRACT | Status: DC
  Administered 2024-02-03: 175 ug via RESPIRATORY_TRACT
  Filled 2024-02-02: qty 3

## 2024-02-02 MED ORDER — DOXYCYCLINE HYCLATE 100 MG PO TABS
100.0000 mg | ORAL_TABLET | Freq: Two times a day (BID) | ORAL | Status: DC
  Administered 2024-02-02 – 2024-02-03 (×2): 100 mg via ORAL
  Filled 2024-02-02 (×2): qty 1

## 2024-02-02 MED ORDER — IPRATROPIUM-ALBUTEROL 0.5-2.5 (3) MG/3ML IN SOLN
RESPIRATORY_TRACT | Status: AC
  Administered 2024-02-02: 3 mL via RESPIRATORY_TRACT
  Filled 2024-02-02: qty 3

## 2024-02-02 MED ORDER — ALBUTEROL SULFATE (2.5 MG/3ML) 0.083% IN NEBU: 2.5000 mg | INHALATION_SOLUTION | RESPIRATORY_TRACT | Status: DC | PRN

## 2024-02-02 MED ORDER — IPRATROPIUM-ALBUTEROL 0.5-2.5 (3) MG/3ML IN SOLN
3.0000 mL | RESPIRATORY_TRACT | Status: DC | PRN
  Administered 2024-02-03: 3 mL via RESPIRATORY_TRACT
  Filled 2024-02-02: qty 3
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
## 2024-02-02 NOTE — Telephone Encounter (Signed)
 Received FMLA via fax today. Called patient and left voicemail to notify her that it was received and there is a $29  processing fee. Also notified patient that it could take about 10-14 busniess days for completion.

## 2024-02-02 NOTE — ED Notes (Signed)
 Pt o2 sats 100% with normal RR of 18- respiratory consulted, approved trial removal of BIPAP.  Pt placed on 5L [place] and maintaining at or above 94% saturation
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
## 2024-02-02 NOTE — ED Provider Notes (Signed)
 [place] EMERGENCY DEPARTMENT AT [facility] Provider Note   CSN: [id number] Arrival date & time: 02/02/24  9168     Patient presents with: Respiratory Distress   [name] is a 63 y.o. female.   Pt is a 63 yo female with pmhx significant for COPD (2L chronically), GERD, obesity, CAD, HLD, HTN, hx SBO, CHF, and DM.  Pt has been sob for the past several days.  She went to [facility] yesterday for SOB and requested a steroid shot.  She was given 10 mg decadron  IM.  Pt is very SOB, so hx is limited, but she said it did not help.       Prior to Admission medications   Medication Sig Start Date End Date Taking? Authorizing Provider  albuterol  (PROVENTIL ) (2.5 MG/3ML) 0.083% nebulizer solution INHALE 3 ML BY NEBULIZATION EVERY 6 HOURS AS NEEDED FOR WHEEZING OR SHORTNESS OF BREATH 10/02/23   Mannam, Praveen, MD  albuterol  (VENTOLIN  HFA) 108 (90 Base) MCG/ACT inhaler Inhale 2 puffs into the lungs every 6 (six) hours as needed for wheezing or shortness of breath.    [provider]  Evolocumab  (REPATHA  SURECLICK) 140 MG/ML SOAJ Inject 140 mg into the skin every 14 (fourteen) days. 01/23/24   Revankar, Jennifer SAUNDERS, MD  Fluticasone -Umeclidin-Vilant (TRELEGY ELLIPTA ) 200-62.5-25 MCG/ACT AEPB Inhale 1 puff into the lungs daily. Patient taking differently: Inhale 1 puff into the lungs daily as needed (wheezing/sob). 10/23/23 10/22/24  Mannam, Praveen, MD  ipratropium-albuterol  (DUONEB) 0.5-2.5 (3) MG/3ML SOLN Take 3 mLs by nebulization every 6 (six) hours. Patient taking differently: Take 3 mLs by nebulization daily as needed (wheezing/sob). 09/23/23   Barbarann Nest, MD  Mepolizumab  (NUCALA ) 100 MG/ML SOAJ Inject contents of one pen (100mg ) in the skin on day 0 in clinic and every 28 days thereafter. Courier to pulm: [address], [place] [address]. Appt on 02/15/24. 01/30/24   Mannam, Praveen, MD  metFORMIN (GLUCOPHAGE) 500 MG tablet Take 500 mg by mouth daily as needed  (only taking when her BS is high).    [provider]  nitroGLYCERIN  (NITROSTAT ) 0.4 MG SL tablet Place 1 tablet (0.4 mg total) under the tongue every 5 (five) minutes as needed. Patient taking differently: Place 0.4 mg under the tongue every 5 (five) minutes as needed for chest pain. 07/21/23 11/29/23  Revankar, Jennifer SAUNDERS, MD  omeprazole  (PRILOSEC) 20 MG capsule TAKE 1 CAPSULE BY MOUTH EVERY DAY 11/29/23   Mannam, Praveen, MD  OXYGEN  Inhale 5 L/min into the lungs daily as needed (sob).    [provider]  predniSONE  (DELTASONE ) 10 MG tablet TAKE 2 TABLETS BY MOUTH DAILY WITH BREAKFAST. 01/14/24   Mannam, Praveen, MD    Allergies: Amoxicillin , Statins, Tylenol  [acetaminophen ], and Zetia  [ezetimibe ]    Review of Systems  Respiratory:  Positive for shortness of breath.   All other systems reviewed and are negative.   Updated Vital Signs BP (!) 156/108 (BP Location: Right Arm)   Pulse 78   Temp 97.6 F (36.4 C)   Resp 19   Ht 5' 1 (1.549 m)   Wt 67 kg   SpO2 99%   BMI 27.91 kg/m   Physical Exam Vitals and nursing note reviewed.  Constitutional:      General: She is in acute distress.     Appearance: Normal appearance.  HENT:     Head: Normocephalic and atraumatic.     Right Ear: External ear normal.     Left Ear:  External ear normal.     Nose: Nose normal.     Mouth/Throat:     Mouth: Mucous membranes are moist.     Pharynx: Oropharynx is clear.  Eyes:     Extraocular Movements: Extraocular movements intact.     Conjunctiva/sclera: Conjunctivae normal.     Pupils: Pupils are equal, round, and reactive to light.  Cardiovascular:     Rate and Rhythm: Regular rhythm. Tachycardia present.     Pulses: Normal pulses.     Heart sounds: Normal heart sounds.  Pulmonary:     Effort: Tachypnea and respiratory distress present.     Breath sounds: Wheezing present.  Abdominal:     General: Abdomen is flat. Bowel sounds are normal.     Palpations: Abdomen is soft.   Genitourinary:    Rectum: Guaiac result negative.  Musculoskeletal:        General: Normal range of motion.     Cervical back: Normal range of motion and neck supple.  Skin:    General: Skin is warm.     Capillary Refill: Capillary refill takes less than 2 seconds.  Neurological:     General: No focal deficit present.     Mental Status: She is alert and oriented to person, place, and time.  Psychiatric:        Mood and Affect: Mood normal.        Behavior: Behavior normal.     (all labs ordered are listed, but only abnormal results are displayed) Labs Reviewed  BASIC METABOLIC PANEL WITH GFR - Abnormal; Notable for the following components:      Result Value   Calcium 8.8 (*)    All other components within normal limits  CBC WITH DIFFERENTIAL/PLATELET - Abnormal; Notable for the following components:   RBC 3.47 (*)    Hemoglobin 9.2 (*)    HCT 31.6 (*)    MCHC 29.1 (*)    All other components within normal limits  RETICULOCYTES - Abnormal; Notable for the following components:   RBC. 3.45 (*)    Immature Retic Fract 24.1 (*)    All other components within normal limits  RESP PANEL BY RT-PCR (RSV, FLU A&B, COVID)  RVPGX2  PRO BRAIN NATRIURETIC PEPTIDE  VITAMIN B12  FOLATE  IRON AND TIBC  FERRITIN  POC OCCULT BLOOD, ED    EKG: EKG Interpretation Date/Time:  Friday February 02 2024 08:57:18 EDT Ventricular Rate:  81 PR Interval:  138 QRS Duration:  102 QT Interval:  372 QTC Calculation: 435 R Axis:   79  Text Interpretation: Sinus rhythm Nonspecific T abnormalities, lateral leads No significant change since last tracing Confirmed by Dean Clarity ([id number]) on 02/02/2024 9:13:58 AM  Radiology: [facility] Chest Port 1 View Result Date: 02/02/2024 EXAM: 1 VIEW XRAY OF THE CHEST 02/02/2024 09:24:00 AM COMPARISON: 11/29/2023 CLINICAL HISTORY: SOB. Respiratory distress; shortness of breath started last night. FINDINGS: LUNGS AND PLEURA: Stable streaky bibasilar opacities.  Central pulmonary vascular congestion without overt pulmonary edema. No pleural effusion. No pneumothorax. HEART AND MEDIASTINUM: No acute abnormality of the cardiac and mediastinal silhouettes. BONES AND SOFT TISSUES: No acute osseous abnormality. IMPRESSION: 1. Stable streaky bibasilar opacities, likely atelectasis versus scarring. 2. Central pulmonary vascular congestion without overt pulmonary edema. Electronically signed by: Donnice Mania MD 02/02/2024 09:51 AM EDT RP Workstation: [id number]     Procedures   Medications Ordered in the ED  ipratropium-albuterol  (DUONEB) 0.5-2.5 (3) MG/3ML nebulizer solution 3 mL (3 mLs Nebulization Given 02/02/24 0840)  methylPREDNISolone  sodium succinate (SOLU-MEDROL ) 125 mg/2 mL injection 125 mg (125 mg Intravenous Given 02/02/24 0842)  albuterol  (PROVENTIL ) (2.5 MG/3ML) 0.083% nebulizer solution 5 mg (5 mg Nebulization Given 02/02/24 0915)                                    Medical Decision Making Amount and/or Complexity of Data Reviewed Labs: ordered. Radiology: ordered.  Risk Prescription drug management. Decision regarding hospitalization.   This patient presents to the ED for concern of sob, this involves an extensive number of treatment options, and is a complaint that carries with it a high risk of complications and morbidity.  The differential diagnosis includes copd exac, chf, pna, covid/flu/rsv, anemia   Co morbidities that complicate the patient evaluation  COPD (2L chronically), GERD, obesity, CAD, HLD, HTN, hx SBO, CHF, and DM   Additional history obtained:  Additional history obtained from epic chart review  Lab Tests:  I Ordered, and personally interpreted labs.  The pertinent results include:  cbc with hgb low at 9.2 (11.0 on 7/18); covid/flu/rsv neg; bmp nl; bnp neg   Imaging Studies ordered:  I ordered imaging studies including cxr  I independently visualized and interpreted imaging which showed   Stable streaky  bibasilar opacities, likely atelectasis versus scarring.  2. Central pulmonary vascular congestion without overt pulmonary edema.   I agree with the radiologist interpretation   Cardiac Monitoring:  The patient was maintained on a cardiac monitor.  I personally viewed and interpreted the cardiac monitored which showed an underlying rhythm of: st   Medicines ordered and prescription drug management:  I ordered medication including nebs/solumedrol  for sx  Reevaluation of the patient after these medicines showed that the patient improved I have reviewed the patients home medicines and have made adjustments as needed   Test Considered:  ct   Critical Interventions:  bipap   Consultations Obtained:  I requested consultation with the hospitalist (Dr. Celinda),  and discussed lab and imaging findings as well as pertinent plan - he will admit  Problem List / ED Course:  Copd exac:  pt placed on bipap shortly after arrival which has significantly helped sob.  Bipap has been weaned off by RT.  Pt is on 5L (normally on 2) Anemia:  hgb 11 in July.  No hx black stools.  FOBT neg.  Anemia panel pending   Reevaluation:  After the interventions noted above, I reevaluated the patient and found that they have :improved   Social Determinants of Health:  Lives at home   Dispostion:  After consideration of the diagnostic results and the patients response to treatment, I feel that the patent would benefit from admission.  CRITICAL CARE Performed by: Mliss Boyers   Total critical care time: 30 minutes  Critical care time was exclusive of separately billable procedures and treating other patients.  Critical care was necessary to treat or prevent imminent or life-threatening deterioration.  Critical care was time spent personally by me on the following activities: development of treatment plan with patient and/or surrogate as well as nursing, discussions with consultants,  evaluation of patient's response to treatment, examination of patient, obtaining history from patient or surrogate, ordering and performing treatments and interventions, ordering and review of laboratory studies, ordering and review of radiographic studies, pulse oximetry and re-evaluation of patient's condition.        Final diagnoses:  COPD exacerbation (HCC)  Anemia,  unspecified type    ED Discharge Orders     None          Dean Clarity, MD 02/02/24 1248

## 2024-02-03 DIAGNOSIS — J9611 Chronic respiratory failure with hypoxia: Secondary | ICD-10-CM | POA: Diagnosis not present

## 2024-02-03 DIAGNOSIS — J441 Chronic obstructive pulmonary disease with (acute) exacerbation: Secondary | ICD-10-CM | POA: Diagnosis not present

## 2024-02-03 LAB — COMPREHENSIVE METABOLIC PANEL WITH GFR
ALT: 18 U/L (ref 0–44)
AST: 17 U/L (ref 15–41)
Albumin: 3.7 g/dL (ref 3.5–5.0)
Alkaline Phosphatase: 46 U/L (ref 38–126)
Anion gap: 8 (ref 5–15)
BUN: 11 mg/dL (ref 8–23)
CO2: 33 mmol/L — ABNORMAL HIGH (ref 22–32)
Calcium: 8.8 mg/dL — ABNORMAL LOW (ref 8.9–10.3)
Chloride: 102 mmol/L (ref 98–111)
Creatinine, Ser: 0.67 mg/dL (ref 0.44–1.00)
GFR, Estimated: >60 mL/min (ref >60)
Glucose, Bld: 82 mg/dL (ref 70–99)
Potassium: 4.5 mmol/L (ref 3.5–5.1)
Sodium: 143 mmol/L (ref 135–145)
Total Bilirubin: 0.4 mg/dL (ref 0.0–1.2)
Total Protein: 5.8 g/dL — ABNORMAL LOW (ref 6.5–8.1)

## 2024-02-03 LAB — CBC
HCT: 32.1 % — ABNORMAL LOW (ref 36.0–46.0)
Hemoglobin: 9.3 g/dL — ABNORMAL LOW (ref 12.0–15.0)
MCH: 26.5 pg (ref 26.0–34.0)
MCHC: 29 g/dL — ABNORMAL LOW (ref 30.0–36.0)
MCV: 91.5 fL (ref 80.0–100.0)
Platelets: 276 K/uL (ref 150–400)
RBC: 3.51 MIL/uL — ABNORMAL LOW (ref 3.87–5.11)
RDW: 14.5 % (ref 11.5–15.5)
WBC: 7.8 K/uL (ref 4.0–10.5)
nRBC: 0 % (ref 0.0–0.2)

## 2024-02-03 MED ORDER — DOXYCYCLINE HYCLATE 100 MG PO TABS: 100.0000 mg | ORAL_TABLET | Freq: Two times a day (BID) | ORAL | 0 refills | Status: AC

## 2024-02-03 MED ORDER — PREDNISONE 10 MG PO TABS: 40.0000 mg | ORAL_TABLET | Freq: Every day | ORAL | 0 refills | Status: AC

## 2024-02-03 MED ORDER — OXYCODONE HCL 5 MG PO TABS
5.0000 mg | ORAL_TABLET | Freq: Four times a day (QID) | ORAL | Status: DC | PRN
  Administered 2024-02-03: 5 mg via ORAL
  Filled 2024-02-03: qty 1

## 2024-02-03 MED ORDER — PREDNISONE 10 MG PO TABS: 20.0000 mg | ORAL_TABLET | Freq: Every day | ORAL | Status: DC

## 2024-02-03 NOTE — Discharge Summary (Signed)
 Physician Discharge Summary  [name] FMW:[id number] DOB: Mar 20, 1961 DOA: 02/02/2024  PCP: Rosalea Rosina [name], PA  Admit date: 02/02/2024 Discharge date: 02/03/2024  Admitted From: Home  Discharge disposition: Home   Recommendations for Outpatient Follow-Up:   Follow up with your primary care provider in one week.  Check CBC, BMP, magnesium  in the next visit Follow-up with pulmonary physician as scheduled by the clinic.  Discharge Diagnosis:   Principal Problem:   COPD with acute exacerbation (HCC) Active Problems:   GERD (gastroesophageal reflux disease)   Hyperlipidemia   Essential hypertension  Discharge Condition: Improved.  Diet recommendation: Low sodium, heart healthy.  Wound care: None.  Code status: Full.   History of Present Illness:    [name] is a 63 y.o. female with medical history significant of COPD, chronic respiratory failure with hypoxia, history of tobacco use, aortic atherosclerosis, CAD, chronic headache, hypertension, GERD, hep C, hyperlipidemia, obesity presented to the hospital with productive cough wheezing fatigue.  In the ED, patient was tachycardic tachypneic and was saturating 99% on supplemental oxygen .  Labs were notable for no leukocytosis.  Fecal occult blood was negative.  COVID influenza and RSV was negative.  proBNP was less than 50.  Chest x-ray showed stable streaky bibasilar opacities likely atelectasis versus scaring. Due to increased work of breathing, patient was put on BiPAP and patient was considered for admission to the hospital for further evaluation and treatment.     Hospital Course:   Following conditions were addressed during hospitalization as listed below,  COPD with acute exacerbation  Received Solu-Medrol .  Transition to prednisone .  Continue doxycycline  p.o. twice daily.  Continue bronchodilators .  Seen by pulmonary during hospitalization.  Currently on 5 L oxygen  by nasal cannula which is her  baseline.  Feels at her baseline.  Continue prednisone  40 mg for next 3 days followed by 20 mg daily at home.  Patient was encouraged compliance with bronchodilators at home.    GERD (gastroesophageal reflux disease) Continue PPI     Hyperlipidemia On Repatha  at home     Essential hypertension   Latest blood pressure of 134/88.  Not on medications at home.  Continue to monitor as outpatient.  Disposition.  At this time, patient is stable for disposition home with outpatient PCP and pulmonary follow-up  Medical Consultants:   Pulmonary  Procedures:    None Subjective:   Today, patient was seen and examined at bedside.  Patient feels better with breathing today.  Denies any nausea, vomiting, abdominal pain shortness of breath dyspnea or chest pain.  She is on 5 L of oxygen  and has ambulated and feels at her baseline.  Discharge Exam:   Vitals:   02/03/24 0533 02/03/24 0912  BP: 134/88   Pulse: 64   Resp: 16   Temp: 98.2 F (36.8 C)   SpO2: 100% 100%   Vitals:   02/02/24 2004 02/03/24 0034 02/03/24 0533 02/03/24 0912  BP: 110/66 129/81 134/88   Pulse: 71 66 64   Resp: 16 16 16    Temp: 98.3 F (36.8 C) 98.6 F (37 C) 98.2 F (36.8 C)   TempSrc: Oral  Oral   SpO2: 100% 100% 100% 100%  Weight:      Height:      Body mass index is 27.91 kg/m.   General: Alert awake, not in obvious distress, on 5 L of oxygen  by nasal cannula, HENT: pupils equally reacting to light,  No scleral pallor or icterus noted. Oral mucosa  is moist.  Chest: Diminished breath sounds bilaterally.  No overt wheezes noted CVS: S1 &S2 heard. No murmur.  Regular rate and rhythm. Abdomen: Soft, nontender, nondistended.  Bowel sounds are heard.   Extremities: No cyanosis, clubbing trace peripheral edema.  Peripheral pulses are palpable. Psych: Alert, awake and oriented, normal mood CNS:  No cranial nerve deficits.  Power equal in all extremities.   Skin: Warm and dry.  No rashes noted.  The  results of significant diagnostics from this hospitalization (including imaging, microbiology, ancillary and laboratory) are listed below for reference.     Diagnostic Studies:   DG Chest Port 1 View Result Date: 02/02/2024 EXAM: 1 VIEW XRAY OF THE CHEST 02/02/2024 09:24:00 AM COMPARISON: 11/29/2023 CLINICAL HISTORY: SOB. Respiratory distress; shortness of breath started last night. FINDINGS: LUNGS AND PLEURA: Stable streaky bibasilar opacities. Central pulmonary vascular congestion without overt pulmonary edema. No pleural effusion. No pneumothorax. HEART AND MEDIASTINUM: No acute abnormality of the cardiac and mediastinal silhouettes. BONES AND SOFT TISSUES: No acute osseous abnormality. IMPRESSION: 1. Stable streaky bibasilar opacities, likely atelectasis versus scarring. 2. Central pulmonary vascular congestion without overt pulmonary edema. Electronically signed by: Donnice Mania MD 02/02/2024 09:51 AM EDT RP Workstation: [id number]     Labs:   Basic Metabolic Panel: Recent Labs  Lab 02/02/24 0838 02/03/24 0530  NA 144 143  K 3.5 4.5  CL 101 102  CO2 30 33*  GLUCOSE 92 82  BUN 9 11  CREATININE 0.51 0.67  CALCIUM 8.8* 8.8*   GFR Estimated Creatinine Clearance: 61.4 mL/min (by C-G formula based on SCr of 0.67 mg/dL). Liver Function Tests: Recent Labs  Lab 02/03/24 0530  AST 17  ALT 18  ALKPHOS 46  BILITOT 0.4  PROT 5.8*  ALBUMIN 3.7   No results for input(s): LIPASE, AMYLASE in the last 168 hours. No results for input(s): AMMONIA in the last 168 hours. Coagulation profile No results for input(s): INR, PROTIME in the last 168 hours.  CBC: Recent Labs  Lab 02/02/24 0838 02/03/24 0530  WBC 8.4 7.8  NEUTROABS 5.7  --   HGB 9.2* 9.3*  HCT 31.6* 32.1*  MCV 91.1 91.5  PLT 280 276   Cardiac Enzymes: No results for input(s): CKTOTAL, CKMB, CKMBINDEX, TROPONINI in the last 168 hours. BNP: Invalid input(s): POCBNP CBG: No results for input(s):  GLUCAP in the last 168 hours. D-Dimer No results for input(s): DDIMER in the last 72 hours. Hgb A1c No results for input(s): HGBA1C in the last 72 hours. Lipid Profile No results for input(s): CHOL, HDL, LDLCALC, TRIG, CHOLHDL, LDLDIRECT in the last 72 hours. Thyroid  function studies No results for input(s): TSH, T4TOTAL, T3FREE, THYROIDAB in the last 72 hours.  Invalid input(s): FREET3 Anemia work up [employer]    02/02/24 0838 02/02/24 1100  VITAMINB12  --  339  FOLATE  --  >20.0  FERRITIN  --  10*  TIBC  --  442  IRON  --  22*  RETICCTPCT 1.3  --    Microbiology Recent Results (from the past 240 hours)  Resp panel by RT-PCR (RSV, Flu A&B, Covid) Anterior Nasal Swab     Status: None   Collection Time: 02/02/24  8:38 AM   Specimen: Anterior Nasal Swab  Result Value Ref Range Status   SARS Coronavirus 2 by RT PCR NEGATIVE NEGATIVE Final    Comment: (NOTE) SARS-CoV-2 target nucleic acids are NOT DETECTED.  The SARS-CoV-2 RNA is generally detectable in upper respiratory specimens during the  acute phase of infection. The lowest concentration of SARS-CoV-2 viral copies this assay can detect is 138 copies/mL. A negative result does not preclude SARS-Cov-2 infection and should not be used as the sole basis for treatment or other patient management decisions. A negative result may occur with  improper specimen collection/handling, submission of specimen other than nasopharyngeal swab, presence of viral mutation(s) within the areas targeted by this assay, and inadequate number of viral copies(<138 copies/mL). A negative result must be combined with clinical observations, patient history, and epidemiological information. The expected result is Negative.  Fact Sheet for Patients:  BloggerCourse.com  Fact Sheet for Healthcare Providers:  SeriousBroker.it  This test is no t yet approved or cleared by  the [place]  FDA and  has been authorized for detection and/or diagnosis of SARS-CoV-2 by FDA under an Emergency Use Authorization (EUA). This EUA will remain  in effect (meaning this test can be used) for the duration of the COVID-19 declaration under Section 564(b)(1) of the Act, 21 U.S.C.section 360bbb-3(b)(1), unless the authorization is terminated  or revoked sooner.       Influenza A by PCR NEGATIVE NEGATIVE Final   Influenza B by PCR NEGATIVE NEGATIVE Final    Comment: (NOTE) The Xpert Xpress SARS-CoV-2/FLU/RSV plus assay is intended as an aid in the diagnosis of influenza from Nasopharyngeal swab specimens and should not be used as a sole basis for treatment. Nasal washings and aspirates are unacceptable for Xpert Xpress SARS-CoV-2/FLU/RSV testing.  Fact Sheet for Patients: BloggerCourse.com  Fact Sheet for Healthcare Providers: SeriousBroker.it  This test is not yet approved or cleared by the [place]  FDA and has been authorized for detection and/or diagnosis of SARS-CoV-2 by FDA under an Emergency Use Authorization (EUA). This EUA will remain in effect (meaning this test can be used) for the duration of the COVID-19 declaration under Section 564(b)(1) of the Act, 21 U.S.C. section 360bbb-3(b)(1), unless the authorization is terminated or revoked.     Resp Syncytial Virus by PCR NEGATIVE NEGATIVE Final    Comment: (NOTE) Fact Sheet for Patients: BloggerCourse.com  Fact Sheet for Healthcare Providers: SeriousBroker.it  This test is not yet approved or cleared by the [place]  FDA and has been authorized for detection and/or diagnosis of SARS-CoV-2 by FDA under an Emergency Use Authorization (EUA). This EUA will remain in effect (meaning this test can be used) for the duration of the COVID-19 declaration under Section 564(b)(1) of the Act, 21  U.S.C. section 360bbb-3(b)(1), unless the authorization is terminated or revoked.  Performed at [facility], [address], [place], [place] [address]   Respiratory (~20 pathogens) panel by PCR     Status: None   Collection Time: 02/02/24  7:00 PM   Specimen: Nasopharyngeal Swab; Respiratory  Result Value Ref Range Status   Adenovirus NOT DETECTED NOT DETECTED Final   Coronavirus 229E NOT DETECTED NOT DETECTED Final    Comment: (NOTE) The Coronavirus on the Respiratory Panel, DOES NOT test for the novel  Coronavirus (2019 nCoV)    Coronavirus HKU1 NOT DETECTED NOT DETECTED Final   Coronavirus NL63 NOT DETECTED NOT DETECTED Final   Coronavirus OC43 NOT DETECTED NOT DETECTED Final   Metapneumovirus NOT DETECTED NOT DETECTED Final   Rhinovirus / Enterovirus NOT DETECTED NOT DETECTED Final   Influenza A NOT DETECTED NOT DETECTED Final   Influenza B NOT DETECTED NOT DETECTED Final   Parainfluenza Virus 1 NOT DETECTED NOT DETECTED Final   Parainfluenza Virus 2 NOT DETECTED NOT  DETECTED Final   Parainfluenza Virus 3 NOT DETECTED NOT DETECTED Final   Parainfluenza Virus 4 NOT DETECTED NOT DETECTED Final   Respiratory Syncytial Virus NOT DETECTED NOT DETECTED Final   Bordetella pertussis NOT DETECTED NOT DETECTED Final   Bordetella Parapertussis NOT DETECTED NOT DETECTED Final   Chlamydophila pneumoniae NOT DETECTED NOT DETECTED Final   Mycoplasma pneumoniae NOT DETECTED NOT DETECTED Final    Comment: Performed at [facility], [address], [place], [place] [address]  Expectorated Sputum Assessment w Gram Stain, Rflx to Resp Cult     Status: None   Collection Time: 02/02/24  9:21 PM   Specimen: Expectorated Sputum  Result Value Ref Range Status   Specimen Description EXPECTORATED SPUTUM  Final   Special Requests NONE  Final   Sputum evaluation   Final    Sputum specimen not acceptable for testing.  Please recollect.   Notified Lorelee LABOR RN @ [phone] 02/02/24  CAL Performed at [facility], [address] [place], [place] [address]    Report Status 02/02/2024 FINAL  Final     Discharge Instructions:   Discharge Instructions     Diet - low sodium heart healthy   Complete by: As directed    Discharge instructions   Complete by: As directed    Follow-up with your primary care provider in 1 week.  Check blood work at that time.  Continue to use inhalers and oxygen  at home.  Seek medical attention for worsening symptoms.  Follow-up with pulmonary as outpatient as scheduled by the clinic.   Increase activity slowly   Complete by: As directed       Allergies as of 02/03/2024       Reactions   Acetaminophen  Shortness Of Breath   Amoxicillin  Shortness Of Breath   Ibuprofen  Shortness Of Breath   Statins Other (See Comments)   Worsened the patient's COPD   Zetia  [ezetimibe ] Other (See Comments)   Worsened the patient's COPD        Medication List     TAKE these medications    albuterol  108 (90 Base) MCG/ACT inhaler Commonly known as: VENTOLIN  HFA Inhale 2 puffs into the lungs every 6 (six) hours as needed for wheezing or shortness of breath. What changed: Another medication with the same name was changed. Make sure you understand how and when to take each.   albuterol  (2.5 MG/3ML) 0.083% nebulizer solution Commonly known as: PROVENTIL  INHALE 3 ML BY NEBULIZATION EVERY 6 HOURS AS NEEDED FOR WHEEZING OR SHORTNESS OF BREATH What changed: See the new instructions.   doxycycline  100 MG tablet Commonly known as: VIBRA -TABS Take 1 tablet (100 mg total) by mouth every 12 (twelve) hours for 3 days.   ipratropium-albuterol  0.5-2.5 (3) MG/3ML Soln Commonly known as: DUONEB Take 3 mLs by nebulization every 6 (six) hours. What changed:  when to take this additional instructions   nitroGLYCERIN  0.4 MG SL tablet Commonly known as: NITROSTAT  Place 1 tablet (0.4 mg total) under the tongue every 5 (five) minutes as  needed. What changed: reasons to take this   Nucala  100 MG/ML Soaj Generic drug: Mepolizumab  Inject contents of one pen (100mg ) in the skin on day 0 in clinic and every 28 days thereafter. Courier to pulm: [address], [address], [place] [address]. Appt on 02/15/24.   omeprazole  20 MG capsule Commonly known as: PRILOSEC TAKE 1 CAPSULE BY MOUTH EVERY DAY What changed:  how much to take when to  take this   OXYGEN  Inhale 5 L/min into the lungs daily as needed (for shortness of breath).   predniSONE  10 MG tablet Commonly known as: DELTASONE  Take 4 tablets (40 mg total) by mouth daily with breakfast for 3 days. What changed: You were already taking a medication with the same name, and this prescription was added. Make sure you understand how and when to take each.   predniSONE  10 MG tablet Commonly known as: DELTASONE  Take 2 tablets (20 mg total) by mouth daily with breakfast. Start taking on: February 07, 2024 What changed: These instructions start on February 07, 2024. If you are unsure what to do until then, ask your doctor or other care provider.   Repatha  SureClick 140 MG/ML Soaj Generic drug: Evolocumab  Inject 140 mg into the skin every 14 (fourteen) days.   Trelegy Ellipta  200-62.5-25 MCG/ACT Aepb Generic drug: Fluticasone -Umeclidin-Vilant Inhale 1 puff into the lungs daily.        Follow-up Information     Rosalea Rosina [name], PA Follow up in 1 week(s).   Specialty: Physician Assistant Contact information: [address] [place] [address] [phone]                  Time coordinating discharge: 39 minutes  Signed:  Dontavia Brand  [facility] 02/03/2024, 10:39 AM
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
## 2024-02-03 NOTE — Progress Notes (Signed)
   02/03/24 1101  TOC Brief Assessment  Insurance and Status Reviewed  Patient has primary care physician Yes  Home environment has been reviewed Apartment  Prior level of function: Independent  Prior/Current Home Services Current home services (Home O2)  Social Drivers of Health Review SDOH reviewed no interventions necessary  Readmission risk has been reviewed Yes  Transition of care needs no transition of care needs at this time    Signed: Heather Saltness, MSW, LCSW Clinical Social Worker Inpatient Care Management 02/03/2024 11:01 AM

## 2024-02-09 ENCOUNTER — Other Ambulatory Visit: Payer: Self-pay

## 2024-02-09 NOTE — Progress Notes (Signed)
 Specialty Pharmacy Initial Fill Coordination Note  Luv Mish is a 63 y.o. female contacted today regarding initial fill of specialty medication(s) Mepolizumab  (Nucala )   Patient requested Courier to Provider Office   Delivery date: 02/13/24   Verified address: [address], [place], [place] [address]   Medication will be filled on 9/29.   Patient is aware of $0 copayment.    **Pt has 2 insurances**

## 2024-02-12 ENCOUNTER — Other Ambulatory Visit: Payer: Self-pay

## 2024-02-14 ENCOUNTER — Telehealth: Payer: Self-pay

## 2024-02-14 NOTE — Telephone Encounter (Addendum)
 Received CMN, signed by Dr Windle Cowboy, received fax confirmation. NFN

## 2024-02-15 ENCOUNTER — Ambulatory Visit: Admitting: Pulmonary Disease

## 2024-02-15 ENCOUNTER — Ambulatory Visit (INDEPENDENT_AMBULATORY_CARE_PROVIDER_SITE_OTHER)

## 2024-02-15 ENCOUNTER — Other Ambulatory Visit: Payer: Self-pay

## 2024-02-15 ENCOUNTER — Other Ambulatory Visit (HOSPITAL_COMMUNITY): Payer: Self-pay

## 2024-02-15 ENCOUNTER — Encounter: Payer: Self-pay | Admitting: Pulmonary Disease

## 2024-02-15 VITALS — BP 134/80 | HR 64 | Temp 98.2°F | Ht 61.0 in | Wt 158.0 lb

## 2024-02-15 DIAGNOSIS — J441 Chronic obstructive pulmonary disease with (acute) exacerbation: Secondary | ICD-10-CM | POA: Diagnosis not present

## 2024-02-15 DIAGNOSIS — J449 Chronic obstructive pulmonary disease, unspecified: Secondary | ICD-10-CM

## 2024-02-15 DIAGNOSIS — Z7189 Other specified counseling: Secondary | ICD-10-CM

## 2024-02-15 MED ORDER — NUCALA 100 MG/ML ~~LOC~~ SOAJ
100.0000 mg | SUBCUTANEOUS | 3 refills | Status: DC
  Filled 2024-02-15 – 2024-03-06 (×2): qty 1, 28d supply, fill #0
  Filled 2024-04-01 – 2024-05-03 (×2): qty 1, 28d supply, fill #1

## 2024-02-15 MED ORDER — SULFAMETHOXAZOLE-TRIMETHOPRIM 800-160 MG PO TABS: 1.0000 | ORAL_TABLET | ORAL | 11 refills | Status: DC

## 2024-02-15 NOTE — Progress Notes (Signed)
 [name] started Nucala  in clinic on 02/15/24. Able to self-administer. Tolerated well.   PLAN Continue Nucala  100mg  every 4 weeks. Next dose is due 03/14/24 and every 4 weeks thereafter. Rx sent to: [facility]: [phone] . Patient provided with pharmacy phone number.  Continue other respiratory medications as prescribed by pulmonary team.  Follow-up with Dr. Theophilus as scheduled in December 2025.    All questions encouraged and answered.  Instructed patient to reach out with any further questions or concerns.  Aleck Puls, PharmD, BCPS, CPP Clinical Pharmacist  [facility]

## 2024-02-15 NOTE — Patient Instructions (Signed)
 Your next Nucala  dose is due on 03/14/24, 04/11/24, and every 28 days thereafter.   If you miss a dose of Nucala , take it as soon as you remember it.   CONTINUE all other respiratory medications as prescribed by Dr. Theophilus. Nucala  does NOT replace other medications.  Your prescription will be shipped from [facility]. Their phone number is [phone]. Someone will call to schedule shipment and confirm address. They will mail your medication to your home.  You will need to be seen by your provider in 3 to 4 months to assess how Nucala  is working for you. Please ensure you have a follow-up appointment scheduled in January. Call our clinic if you need to make this appointment. [phone].  Stay up to date on all routine vaccines: influenza, pneumonia, COVID19, Shingles  How to manage an injection site reaction: Remember the 5 C's: COUNTER - leave on the counter at least 30 minutes but up to overnight to bring medication to room temperature. This may help prevent stinging COLD - place something cold (like an ice gel pack or cold water  bottle) on the injection site just before cleansing with alcohol. This may help reduce pain CLARITIN  - use Claritin  (generic name is loratadine ) for the first two weeks of treatment or the day of, the day before, and the day after injecting. This will help to minimize injection site reactions CORTISONE CREAM - apply if injection site is irritated and itching CALL ME - if injection site reaction is bigger than the size of your fist, looks infected, blisters, or if you develop hives

## 2024-02-15 NOTE — Patient Instructions (Signed)
  VISIT SUMMARY: During your visit, we discussed your recent COPD exacerbation and hospitalization, ongoing breathing difficulties, and potential sleep apnea. We have made adjustments to your treatment plan to help manage your symptoms and improve your overall health.  YOUR PLAN: CHRONIC OBSTRUCTIVE PULMONARY DISEASE (COPD): You have been experiencing persistent breathing difficulties and were recently hospitalized due to a COPD exacerbation. -Continue taking prednisone  20 mg daily. -We will start you on Nucala  injections to help reduce lung inflammation and prevent future exacerbations. You will receive training on how to administer these injections at home. -You are referred to pulmonary rehabilitation to help improve your lung function and overall health.  PREVENTION OF PNEUMOCYSTIS PNEUMONIA: Long-term use of steroids increases your risk of developing Pneumocystis pneumonia. -Start taking Bactrim  three times a week to prevent Pneumocystis pneumonia.  SUSPECTED SLEEP APNEA: You have been experiencing significant breathing difficulties at night, which may be due to sleep apnea. -We will schedule an in-lab sleep study after your cataract surgeries to evaluate for sleep apnea and determine if you need CPAP or BiPAP therapy.

## 2024-02-15 NOTE — Progress Notes (Addendum)
 HPI Patient presents today to [facility] to see pharmacy team for Nucala  new start.  Past medical history includes severe COPD. Seen by Dr. Theophilus today after recent hospitalization for exacerbation.   Respiratory Medications Current regimen:  - Trelegy Ellipta  200-62.5-25 mcg/act (Inhale 1 puff into the lungs daily) - prednisone  20mg  daily - Bactrim  DS (Take 1 tablet by mouth 3 (three) times a week) - DuoNeb 0.5-2.5 mg/4mL soln (Take 3 mLs by nebulization every 6 (six) hours) - Proventil  (2.5mg /24mL) 0.083% neb soln (Inhale 3 mL by nebulization every 6 hours as needed for wheezing or shortness of breath) - Ventolin  HFA 108 mcg/act (Inhale 2 puffs into the lungs every 6 (six) hours as needed for wheezing or shortness of breath)  OBJECTIVE Allergies  Allergen Reactions   Acetaminophen  Shortness Of Breath   Amoxicillin  Shortness Of Breath   Ibuprofen  Shortness Of Breath   Statins Other (See Comments)    Worsened the patient's COPD   Zetia  [Ezetimibe ] Other (See Comments)    Worsened the patient's COPD    Outpatient Encounter Medications as of 02/15/2024  Medication Sig   albuterol  (PROVENTIL ) (2.5 MG/3ML) 0.083% nebulizer solution INHALE 3 ML BY NEBULIZATION EVERY 6 HOURS AS NEEDED FOR WHEEZING OR SHORTNESS OF BREATH   albuterol  (VENTOLIN  HFA) 108 (90 Base) MCG/ACT inhaler Inhale 2 puffs into the lungs every 6 (six) hours as needed for wheezing or shortness of breath.   Evolocumab  (REPATHA  SURECLICK) 140 MG/ML SOAJ Inject 140 mg into the skin every 14 (fourteen) days.   Fluticasone -Umeclidin-Vilant (TRELEGY ELLIPTA ) 200-62.5-25 MCG/ACT AEPB Inhale 1 puff into the lungs daily.   ipratropium-albuterol  (DUONEB) 0.5-2.5 (3) MG/3ML SOLN Take 3 mLs by nebulization every 6 (six) hours.   Mepolizumab  (NUCALA ) 100 MG/ML SOAJ Inject contents of one pen (100mg ) in the skin on day 0 in clinic and every 28 days thereafter. Courier to pulm: [address], [place] [address].  Appt on 02/15/24. (Patient not taking: Reported on 02/02/2024)   nitroGLYCERIN  (NITROSTAT ) 0.4 MG SL tablet Place 1 tablet (0.4 mg total) under the tongue every 5 (five) minutes as needed.   omeprazole  (PRILOSEC) 20 MG capsule TAKE 1 CAPSULE BY MOUTH EVERY DAY   OXYGEN  Inhale 5 L/min into the lungs daily as needed (for shortness of breath).   predniSONE  (DELTASONE ) 10 MG tablet Take 2 tablets (20 mg total) by mouth daily with breakfast.   [START ON 02/16/2024] sulfamethoxazole -trimethoprim  (BACTRIM  DS) 800-160 MG tablet Take 1 tablet by mouth 3 (three) times a week.   No facility-administered encounter medications on file as of 02/15/2024.     Immunization History  Administered Date(s) Administered   Influenza Inj Mdck Quad Pf 01/24/2017   Influenza Split 02/09/2012, 02/14/2015   Influenza Whole 12/24/2017   Influenza,inj,Quad PF,6+ Mos 01/14/2017   Influenza-Unspecified 05/16/2012, 12/31/2018   PFIZER(Purple Top)SARS-COV-2 Vaccination 07/21/2019, 08/19/2019, 09/13/2019, 05/11/2020   Pneumococcal Polysaccharide-23 09/11/2011, 04/15/2012     PFTs    Latest Ref Rng & Units 06/02/2017    2:58 PM 02/16/2016    2:57 PM 11/10/2015    9:01 AM  PFT Results  FVC-Pre L 1.27  1.25  1.49   FVC-Predicted Pre % 51  49  58   FVC-Post L 1.50  1.57  1.62   FVC-Predicted Post % 60  61  63   Pre FEV1/FVC % % 51  52  53   Post FEV1/FCV % % 53  55  53   FEV1-Pre L 0.65  0.65  0.79   FEV1-Predicted Pre % 33  32  39   FEV1-Post L 0.79  0.86  0.85   DLCO uncorrected ml/min/mmHg 7.12   9.94   DLCO UNC% % 32   45   DLCO corrected ml/min/mmHg 7.02   10.61   DLCO COR %Predicted % 32   48   DLVA Predicted % 50   70   TLC L 4.62     TLC % Predicted % 96     RV % Predicted % 160        Eosinophils Blood eosinophil count was 200 cells/microL taken on 11/30/23.   IgE: 27 on 06/14/2022   Assessment   Biologics training for mepolizumab  (Nucala )  Goals of therapy: Mechanism of Action: Not fully  understood. It does act an interleukin-5 (IL-5) antagonist monoclonal antibody that reduces the production and survival of eosinophils by blocking the binding of IL-5 to the alpha chain of the receptor complex on the eosinophil cell surface. Reviewed that Nucala  is add-on medication and patient must continue maintenance inhaler regimen. Response to therapy: may take 3 months to 6 months to determine efficacy. Discussed that patients generally feel improvement sooner than 3 months.  Side effects: headache (19%), injection site reaction (7-15%), antibody development (6%), backache (5%), fatigue (5%)  Dose: 100 mg subcutaneously every 4 weeks  Administration/Storage:  Reviewed administration sites of thigh or abdomen (at least 2-3 inches away from abdomen). Reviewed the upper arm is only appropriate if caregiver is administering injection  Do not shake the reconstituted solution as this could lead to product foaming or precipitation. Solution should be clear to opalescent and colorless to pale yellow or pale brown, essentially particle free. Small air bubbles, however, are expected and acceptable. If particulate matter remains in the solution or if the solution appears cloudy or milky, discard the solution.  Reviewed storage of medication in refrigerator. Reviewed that Nucala  can be stored at room temperature in unopened carton for up to 7 days.  Access: Approval of Nucala  through: insurance  Patient self-administered Nucala  100mg /mL in right upper thigh using [facility]-supplied medication.  Nucala  100mg /mL autoinjector pen NDC: [phone] Lot: [id number] Expiration: 2028-JAN  Patient monitored for 30 minutes for adverse reaction.  Patient tolerated well. Patient denies itchiness and irritation at injection.  PLAN Continue Nucala  100mg  every 4 weeks. Next dose is due 03/14/24 and every 4 weeks thereafter. Rx sent to: [facility]: [phone] . Patient provided with pharmacy phone  number.  Continue other respiratory medications as prescribed by pulmonary team. Nucala  does NOT replace other respiratory medications. Patient verbalizes understanding. Follow-up with Dr. Theophilus as scheduled in December 2025.   All questions encouraged and answered.  Instructed patient to reach out with any further questions or concerns.  Thank you for allowing pharmacy to participate in this patient's care.  This appointment required 45 minutes of patient care (this includes precharting, chart review, review of results, face-to-face care, etc.).

## 2024-02-15 NOTE — Progress Notes (Signed)
 [name]    [id number]    January 29, 1961  Primary Care Physician:Vanstory, Rosina [name], PA  Referring Physician: Catalina Bare, MD [id number] [place],  [place] [address]  Chief complaint:  Follow-up for  Severe COPD with recurrent hospitalization  HPI: 63 y.o. smoker with very severe COPD, chronic hypoxic respiratory failure.  Continues to have chronic cough, dyspnea on exertion, intermittent hemoptysis with mucus Has recurrent hospitalizations for acute on chronic respiratory failure secondary to COPD exacerbations Discontinued Breztri  due to adverse effects and cost. We had tried multiple controller medications in the past and is currently on Trelegy.  Also on 20 mg of prednisone .  Attempts to reduce the prednisone  dose have been unsuccessful.  Chronic azithromycin  caused gastrointestinal discomfort, limiting its use. Daliresp  induced anxiety and insomnia.  Ohtuvayre  was prescribed but she could not continue due to the cost associated with it  Interim history: Discussed the use of AI scribe software for clinical note transcription with the patient, who gave verbal consent to proceed.  History of Present Illness [name] is a 63 year old female with COPD who presents for follow-up after a recent hospitalization.  Dyspnea and copd exacerbation - Hospitalized in September 2025 for a COPD exacerbation - Persistent dyspnea, particularly with attempts to taper prednisone  below 20 mg daily - Increased dyspnea when prednisone  is reduced to 10 mg or discontinued, requiring return to 20 mg daily - Previous treatments include chronic azithromycin , Daliresp , and Ohtuvayre ; Ohtuvayre  discontinued due to cost despite partial insurance coverage - Insurance denied Dupixent  but she has been approved for Nucala   Nocturnal dyspnea and sleep-related breathing disorders - Significant nocturnal dyspnea impacting ability to breathe comfortably at night - No prior  sleep study performed - No use of CPAP or BiPAP devices  Pulmonary rehabilitation - Previously referred to pulmonary rehabilitation but unable to attend due to work commitments - Now retired and open to participating in pulmonary rehabilitation  Relevant pulmonary history 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 Exposures: No mold, hot tub, Jacuzzi. Smoking history: 35-pack-year smoker.  Quit in 2011 Travel history: No significant travel history Relevant family history: No significant family history of lung disease.  Outpatient Encounter Medications as of 02/15/2024  Medication Sig   albuterol  (PROVENTIL ) (2.5 MG/3ML) 0.083% nebulizer solution INHALE 3 ML BY NEBULIZATION EVERY 6 HOURS AS NEEDED FOR WHEEZING OR SHORTNESS OF BREATH   albuterol  (VENTOLIN  HFA) 108 (90 Base) MCG/ACT inhaler Inhale 2 puffs into the lungs every 6 (six) hours as needed for wheezing or shortness of breath.   Evolocumab  (REPATHA  SURECLICK) 140 MG/ML SOAJ Inject 140 mg into the skin every 14 (fourteen) days.   Fluticasone -Umeclidin-Vilant (TRELEGY ELLIPTA ) 200-62.5-25 MCG/ACT AEPB Inhale 1 puff into the lungs daily.   ipratropium-albuterol  (DUONEB) 0.5-2.5 (3) MG/3ML SOLN Take 3 mLs by nebulization every 6 (six) hours.   nitroGLYCERIN  (NITROSTAT ) 0.4 MG SL tablet Place 1 tablet (0.4 mg total) under the tongue every 5 (five) minutes as needed.   omeprazole  (PRILOSEC) 20 MG capsule TAKE 1 CAPSULE BY MOUTH EVERY DAY   OXYGEN  Inhale 5 L/min into the lungs daily as needed (for shortness of breath).   predniSONE  (DELTASONE ) 10 MG tablet Take 2 tablets (20 mg total) by mouth daily with breakfast.   Mepolizumab  (NUCALA ) 100 MG/ML SOAJ  Inject contents of one pen (100mg ) in the skin on day 0 in clinic and every 28 days thereafter. Courier to pulm: [address]  [address], [place] [address]. Appt on 02/15/24. (Patient not taking: Reported on 02/02/2024)   No facility-administered encounter medications on file as of 02/15/2024.   Vitals:   02/15/24 0904  BP: 134/80  Pulse: 64  Temp: 98.2 F (36.8 C)  Height: 5' 1 (1.549 m)  Weight: 158 lb (71.7 kg)  SpO2: 100% Comment: 5L TANK  TempSrc: Oral  BMI (Calculated): 29.87     Physical Exam GEN: No acute distress CV: Regular rate and rhythm no murmurs LUNGS: Clear to auscultation bilaterally normal respiratory effort SKIN JOINTS: Warm and dry no rash   Data Reviewed: Imaging: Low-dose screening CT 04/19/2021-new areas of bilateral subpleural groundglass consolidation in the lower lobes, coronary artery calcification, enlarged PA, emphysema.  Screening CT 07/15/21 - new large consolidation right middle and right lower lobe  CT 11/01/2021-irregular focus of consolidation in the right middle lobe  CT chest 05/26/2022-small consolidation in the medial right middle lobe unchanged since 09/28/2022, emphysema, chronic bronchitis  CT high-resolution 06/22/2023-shifting consolidation with scattered mucoid impaction, emphysema  CT chest 11/18/2023-no acute findings.  Chronic bronchiectasis and scarring in the right middle lobe, emphysema.  Chest x-ray 02/02/2024-streaky bibasal opacities, central vascular congestion I have reviewed the images personally.  PFT 06/02/17 FVC 1.50 [60%], FEV1 0.79 [40%], F/F 53, TLC 96, RV/TLC 167%, DLCO 32% Severe obstruction with bronchodilator response, air-trapping.  Severe diffusion impairment.  11/10/15:  Walked 360 meters / Baseline Sat 96% on RA / Nadir Sat 95% on RA  MICROBIOLOGY Sputum Ctx (11/13/15):  Paecilomyces species / Oral Flora / AFB negative    LABS 03/01/15 HIV:  Negative   09/29/14 ANA:  Negative   05/16/11 Alpha-1 antitrypsin: MM (122)  Cardiac work-up by Dr. Ladona Nuclear stress test 11/14/16-very small inferoseptal ischemia, preserved  LVEF with mild hypokinesis the same region Echocardiogram shows mild pulmonary hypertension with normal LV systolic function.  Echocardiogram 09/30/2023-LVEF 55-60%, RV size mildly enlarged with mild systolic function.  Tricuspid regurgitation is inadequate for assessing pulmonary hypertension.  TAPSE 2.2 cm Assessment & Plan Chronic obstructive pulmonary disease (COPD) on chronic systemic steroids COPD exacerbations required hospitalization in September 2025. She is currently on 20 mg of prednisone  daily. Attempts to taper prednisone  resulted in increased dyspnea. Previous trials of azithromycin , Daliresp , and Ohtuvayre  were either ineffective or unaffordable. Nucala  injection approved as an alternative to Dupixent  to reduce lung inflammation and prevent exacerbations. - Administer Nucala  injection and provide training for home administration - Refer to pulmonary rehabilitation.  She has previously unable to attend due to work commitment. - Continue prednisone  20 mg daily - Continue Trelegy inhaler  Prevention of Pneumocystis pneumonia due to immunosuppression Chronic use of systemic steroids increases risk for Pneumocystis pneumonia. Prophylactic antibiotic therapy is indicated to prevent infection. - Initiate Bactrim  three times a week for Pneumocystis pneumonia prophylaxis as we are unable to taper off prednisone   Suspected sleep apnea Reports of nocturnal dyspnea suggestive of sleep apnea. No prior sleep study conducted. Sleep study required to confirm diagnosis and determine need for CPAP or BiPAP. In-lab sleep study preferred to potentially add oxygen  if needed. - Schedule in-lab sleep study after cataract surgeries to evaluate for sleep apnea and potential need for CPAP or BiPAP.  Use of NIV may also prevent recurrent hospitalization.   Health maintenance Will need referral to lung cancer screening next  year 09/03/2011- Pneumovax Up-to-date with RSV and COVID.  Does not want to get flu  vaccine due to previous side effects  Plan/Recommendations: Continue Trelegy inhaler and prednisone  at 20 mg/day Start Bactrim  for prophylaxis Pulmonary rehab Nucala  start In lab sleep study  I spent a total of 120 minutes in the care of the patient today including preparing to see the patient, getting/reviewing separately obtained history, performing a medically appropriate exam/evaluation, counseling and educating, placing orders, documenting clinical information in the EHR, independently interpreting results, coordinating care, and supervising pharmacist training for nucala  innjections.   Lonna Coder MD  Pulmonary and Critical Care 02/15/2024, 9:11 AM  CC: Catalina Bare, MD

## 2024-02-17 ENCOUNTER — Other Ambulatory Visit: Payer: Self-pay | Admitting: Pulmonary Disease

## 2024-02-19 ENCOUNTER — Telehealth (HOSPITAL_COMMUNITY): Payer: Self-pay

## 2024-02-19 ENCOUNTER — Other Ambulatory Visit: Payer: Self-pay | Admitting: Pulmonary Disease

## 2024-02-19 NOTE — Telephone Encounter (Signed)
 Copied from CRM [id number]. Topic: Clinical - Medication Refill >> Feb 19, 2024  2:55 PM Rozanna G wrote: Medication: albuterol  (VENTOLIN  HFA) 108 (90 Base) MCG/ACT inhaler  Has the patient contacted their pharmacy? Yes (Agent: If no, request that the patient contact the pharmacy for the refill. If patient does not wish to contact the pharmacy document the reason why and proceed with request.) (Agent: If yes, when and what did the pharmacy advise?)  This is the patient's preferred pharmacy:  [id number] [place], [place] - [address] [place] [address] Phone: [phone] Fax: [phone]    Is this the correct pharmacy for this prescription? Yes If no, delete pharmacy and type the correct one.   Has the prescription been filled recently? Yes  Is the patient out of the medication? Yes  Has the patient been seen for an appointment in the last year OR does the patient have an upcoming appointment? Yes  Can we respond through MyChart? No  Agent: Please be advised that Rx refills may take up to 3 business days. We ask that you follow-up with your pharmacy.

## 2024-02-19 NOTE — Telephone Encounter (Signed)
 Spoke to pt about scheduling for [employer]. Pt stated she is getting cataract surgery done on both eyes and would like to hold off until November. Will pass on to support staff.

## 2024-03-01 ENCOUNTER — Telehealth: Payer: Self-pay | Admitting: *Deleted

## 2024-03-01 MED ORDER — ALBUTEROL SULFATE HFA 108 (90 BASE) MCG/ACT IN AERS: 2.0000 | INHALATION_SPRAY | Freq: Four times a day (QID) | RESPIRATORY_TRACT | 2 refills | Status: DC | PRN

## 2024-03-01 NOTE — Telephone Encounter (Signed)
 Copied from CRM [id number]. Topic: Clinical - Prescription Issue >> Mar 01, 2024  9:21 AM Isabell A wrote: Reason for CRM: Patient requested refills for her rescue inhaler on 10/6 & still has not received it - states she has severe COPD.   albuterol  (VENTOLIN  HFA) 108 (90 Base) MCG/ACT inhaler  I called pt and there was no answer and her VM was full  I refilled her albuterol  inhaler Sent mychart msg letting her know

## 2024-03-02 ENCOUNTER — Other Ambulatory Visit: Payer: Self-pay

## 2024-03-02 ENCOUNTER — Observation Stay (HOSPITAL_COMMUNITY)
Admission: EM | Admit: 2024-03-02 | Discharge: 2024-03-04 | Disposition: A | Discharge status: Home / Self Care | Attending: Student | Admitting: Student

## 2024-03-02 ENCOUNTER — Encounter (HOSPITAL_COMMUNITY): Payer: Self-pay | Admitting: Internal Medicine

## 2024-03-02 ENCOUNTER — Emergency Department (HOSPITAL_COMMUNITY)

## 2024-03-02 DIAGNOSIS — J441 Chronic obstructive pulmonary disease with (acute) exacerbation: Secondary | ICD-10-CM | POA: Diagnosis not present

## 2024-03-02 DIAGNOSIS — R0602 Shortness of breath: Secondary | ICD-10-CM | POA: Diagnosis present

## 2024-03-02 DIAGNOSIS — I10 Essential (primary) hypertension: Secondary | ICD-10-CM | POA: Insufficient documentation

## 2024-03-02 DIAGNOSIS — I700 Atherosclerosis of aorta: Secondary | ICD-10-CM | POA: Diagnosis not present

## 2024-03-02 DIAGNOSIS — Z8616 Personal history of COVID-19: Secondary | ICD-10-CM | POA: Insufficient documentation

## 2024-03-02 DIAGNOSIS — E785 Hyperlipidemia, unspecified: Secondary | ICD-10-CM | POA: Diagnosis not present

## 2024-03-02 DIAGNOSIS — J9621 Acute and chronic respiratory failure with hypoxia: Secondary | ICD-10-CM | POA: Diagnosis not present

## 2024-03-02 DIAGNOSIS — I2510 Atherosclerotic heart disease of native coronary artery without angina pectoris: Secondary | ICD-10-CM | POA: Insufficient documentation

## 2024-03-02 DIAGNOSIS — D509 Iron deficiency anemia, unspecified: Secondary | ICD-10-CM | POA: Insufficient documentation

## 2024-03-02 DIAGNOSIS — J9622 Acute and chronic respiratory failure with hypercapnia: Secondary | ICD-10-CM | POA: Insufficient documentation

## 2024-03-02 DIAGNOSIS — K219 Gastro-esophageal reflux disease without esophagitis: Secondary | ICD-10-CM | POA: Insufficient documentation

## 2024-03-02 DIAGNOSIS — Z87891 Personal history of nicotine dependence: Secondary | ICD-10-CM | POA: Insufficient documentation

## 2024-03-02 LAB — BASIC METABOLIC PANEL WITH GFR
Anion gap: 9 (ref 5–15)
BUN: 11 mg/dL (ref 8–23)
CO2: 35 mmol/L — ABNORMAL HIGH (ref 22–32)
Calcium: 9.6 mg/dL (ref 8.9–10.3)
Chloride: 98 mmol/L (ref 98–111)
Creatinine, Ser: 0.7 mg/dL (ref 0.44–1.00)
GFR, Estimated: >60 mL/min (ref >60)
Glucose, Bld: 87 mg/dL (ref 70–99)
Potassium: 3.8 mmol/L (ref 3.5–5.1)
Sodium: 143 mmol/L (ref 135–145)

## 2024-03-02 LAB — CBC WITH DIFFERENTIAL/PLATELET
Abs Immature Granulocytes: 0.04 K/uL (ref 0.00–0.07)
Basophils Absolute: 0 K/uL (ref 0.0–0.1)
Basophils Relative: 0 %
Eosinophils Absolute: 0 K/uL (ref 0.0–0.5)
Eosinophils Relative: 0 %
HCT: 36.4 % (ref 36.0–46.0)
Hemoglobin: 10.5 g/dL — ABNORMAL LOW (ref 12.0–15.0)
Immature Granulocytes: 0 %
Lymphocytes Relative: 25 %
Lymphs Abs: 2.4 K/uL (ref 0.7–4.0)
MCH: 25.3 pg — ABNORMAL LOW (ref 26.0–34.0)
MCHC: 28.8 g/dL — ABNORMAL LOW (ref 30.0–36.0)
MCV: 87.7 fL (ref 80.0–100.0)
Monocytes Absolute: 0.9 K/uL (ref 0.1–1.0)
Monocytes Relative: 10 %
Neutro Abs: 6.1 K/uL (ref 1.7–7.7)
Neutrophils Relative %: 65 %
Platelets: 360 K/uL (ref 150–400)
RBC: 4.15 MIL/uL (ref 3.87–5.11)
RDW: 14.7 % (ref 11.5–15.5)
WBC: 9.5 K/uL (ref 4.0–10.5)
nRBC: 0 % (ref 0.0–0.2)

## 2024-03-02 LAB — BLOOD GAS, VENOUS
Acid-Base Excess: 11.6 mmol/L — ABNORMAL HIGH (ref 0.0–2.0)
Bicarbonate: 39.3 mmol/L — ABNORMAL HIGH (ref 20.0–28.0)
O2 Saturation: 74.7 %
Patient temperature: 36.1
pCO2, Ven: 65 mmHg — ABNORMAL HIGH (ref 44–60)
pH, Ven: 7.38 (ref 7.25–7.43)
pO2, Ven: 42 mmHg (ref 32–45)

## 2024-03-02 LAB — TROPONIN T, HIGH SENSITIVITY
Troponin T High Sensitivity: 18 ng/L (ref 0–19)
Troponin T High Sensitivity: <15 ng/L (ref 0–19)

## 2024-03-02 LAB — RESP PANEL BY RT-PCR (RSV, FLU A&B, COVID)  RVPGX2
Influenza A by PCR: NEGATIVE
Influenza B by PCR: NEGATIVE
Resp Syncytial Virus by PCR: NEGATIVE
SARS Coronavirus 2 by RT PCR: NEGATIVE

## 2024-03-02 LAB — PHOSPHORUS: Phosphorus: 2.7 mg/dL (ref 2.5–4.6)

## 2024-03-02 LAB — MAGNESIUM: Magnesium: 2.6 mg/dL — ABNORMAL HIGH (ref 1.7–2.4)

## 2024-03-02 MED ORDER — IPRATROPIUM-ALBUTEROL 0.5-2.5 (3) MG/3ML IN SOLN: 3.0000 mL | Freq: Once | RESPIRATORY_TRACT | Status: AC

## 2024-03-02 MED ORDER — ALBUTEROL SULFATE (2.5 MG/3ML) 0.083% IN NEBU: 2.5000 mg | INHALATION_SOLUTION | RESPIRATORY_TRACT | Status: DC | PRN

## 2024-03-02 MED ORDER — ALBUTEROL SULFATE (2.5 MG/3ML) 0.083% IN NEBU
INHALATION_SOLUTION | RESPIRATORY_TRACT | Status: AC
  Administered 2024-03-02: 10 mg
  Filled 2024-03-02: qty 12

## 2024-03-02 MED ORDER — ONDANSETRON HCL 4 MG PO TABS: 4.0000 mg | ORAL_TABLET | Freq: Four times a day (QID) | ORAL | Status: DC | PRN

## 2024-03-02 MED ORDER — MAGNESIUM SULFATE 2 GM/50ML IV SOLN
2.0000 g | Freq: Once | INTRAVENOUS | Status: AC
  Administered 2024-03-02: 2 g via INTRAVENOUS
  Filled 2024-03-02: qty 50

## 2024-03-02 MED ORDER — PANTOPRAZOLE SODIUM 40 MG PO TBEC
40.0000 mg | DELAYED_RELEASE_TABLET | Freq: Every day | ORAL | Status: DC
  Administered 2024-03-02 – 2024-03-04 (×3): 40 mg via ORAL
  Filled 2024-03-02 (×3): qty 1

## 2024-03-02 MED ORDER — IPRATROPIUM-ALBUTEROL 0.5-2.5 (3) MG/3ML IN SOLN
3.0000 mL | Freq: Four times a day (QID) | RESPIRATORY_TRACT | Status: DC
  Administered 2024-03-02 (×2): 3 mL via RESPIRATORY_TRACT
  Filled 2024-03-02 (×2): qty 3

## 2024-03-02 MED ORDER — METHYLPREDNISOLONE SODIUM SUCC 125 MG IJ SOLR
125.0000 mg | Freq: Once | INTRAMUSCULAR | Status: AC
  Administered 2024-03-02: 125 mg via INTRAVENOUS
  Filled 2024-03-02: qty 2

## 2024-03-02 MED ORDER — IPRATROPIUM-ALBUTEROL 0.5-2.5 (3) MG/3ML IN SOLN
3.0000 mL | RESPIRATORY_TRACT | Status: DC
  Administered 2024-03-03 – 2024-03-04 (×10): 3 mL via RESPIRATORY_TRACT
  Filled 2024-03-02 (×10): qty 3

## 2024-03-02 MED ORDER — ENOXAPARIN SODIUM 40 MG/0.4ML IJ SOSY
40.0000 mg | PREFILLED_SYRINGE | INTRAMUSCULAR | Status: DC
  Administered 2024-03-02 – 2024-03-03 (×2): 40 mg via SUBCUTANEOUS
  Filled 2024-03-02 (×2): qty 0.4

## 2024-03-02 MED ORDER — IPRATROPIUM-ALBUTEROL 0.5-2.5 (3) MG/3ML IN SOLN
RESPIRATORY_TRACT | Status: AC
  Administered 2024-03-02: 3 mL via RESPIRATORY_TRACT
  Filled 2024-03-02: qty 3

## 2024-03-02 MED ORDER — PREDNISONE 20 MG PO TABS
40.0000 mg | ORAL_TABLET | Freq: Every day | ORAL | Status: DC
  Administered 2024-03-03 – 2024-03-04 (×2): 40 mg via ORAL
  Filled 2024-03-02 (×2): qty 2

## 2024-03-02 MED ORDER — ONDANSETRON HCL 4 MG/2ML IJ SOLN: 4.0000 mg | Freq: Four times a day (QID) | INTRAMUSCULAR | Status: DC | PRN

## 2024-03-02 MED ADMIN — Potassium Chloride Microencapsulated Crys ER Tab 20 mEq: 40 meq | ORAL | NDC 00245531989

## 2024-03-02 MED FILL — Albuterol Sulfate Soln Nebu 0.083% (2.5 MG/3ML): 10.0000 mg | RESPIRATORY_TRACT | Qty: 12 | Status: AC

## 2024-03-02 MED FILL — Potassium Chloride Microencapsulated Crys ER Tab 20 mEq: 40.0000 meq | ORAL | Qty: 2 | Status: AC

## 2024-03-02 NOTE — ED Notes (Signed)
 Respiratory called

## 2024-03-02 NOTE — ED Provider Notes (Signed)
 [place] EMERGENCY DEPARTMENT AT [facility] Provider Note   CSN: [id number] Arrival date & time: 03/02/24  1042     Patient presents with: COPD   [name] is a 63 y.o. female.   The history is provided by the patient and medical records. No language interpreter was used.  COPD     63 year old female with multiple comorbidity which includes COPD who is currently on 5 L of oxygen  chronically at home, CAD, polysubstance use, GERD, hypertension who drove herself here from home for evaluation of shortness of breath.  Patient endorsed progressive worsening shortness of breath since last night not improved with her home oxygen .  States symptoms felt similar to prior COPD exacerbation.  History is limited as patient is having difficulty breathing.  Level 5 caveat applies.  She denies having fever or chills denies any other environmental changes.  Denies having chest pain.  Prior to Admission medications   Medication Sig Start Date End Date Taking? Authorizing Provider  albuterol  (PROVENTIL ) (2.5 MG/3ML) 0.083% nebulizer solution INHALE 3 ML BY NEBULIZATION EVERY 6 HOURS AS NEEDED FOR WHEEZING OR SHORTNESS OF BREATH 10/02/23   Mannam, Praveen, MD  albuterol  (VENTOLIN  HFA) 108 (90 Base) MCG/ACT inhaler Inhale 2 puffs into the lungs every 6 (six) hours as needed for wheezing or shortness of breath. 03/01/24   Mannam, Praveen, MD  Evolocumab  (REPATHA  SURECLICK) 140 MG/ML SOAJ Inject 140 mg into the skin every 14 (fourteen) days. 01/23/24   Revankar, Jennifer SAUNDERS, MD  Fluticasone -Umeclidin-Vilant (TRELEGY ELLIPTA ) 200-62.5-25 MCG/ACT AEPB Inhale 1 puff into the lungs daily. 10/23/23 10/22/24  Mannam, Praveen, MD  ipratropium-albuterol  (DUONEB) 0.5-2.5 (3) MG/3ML SOLN Take 3 mLs by nebulization every 6 (six) hours. 09/23/23   Barbarann Nest, MD  Mepolizumab  (NUCALA ) 100 MG/ML SOAJ Inject 1 mL (100 mg total) into the skin every 28 (twenty-eight) days. 02/15/24   Mannam, Praveen, MD   nitroGLYCERIN  (NITROSTAT ) 0.4 MG SL tablet Place 1 tablet (0.4 mg total) under the tongue every 5 (five) minutes as needed. 07/21/23 02/15/24  Revankar, Jennifer SAUNDERS, MD  omeprazole  (PRILOSEC) 20 MG capsule TAKE 1 CAPSULE BY MOUTH EVERY DAY 11/29/23   Mannam, Praveen, MD  OXYGEN  Inhale 5 L/min into the lungs daily as needed (for shortness of breath).    [provider]  predniSONE  (DELTASONE ) 10 MG tablet Take 2 tablets (20 mg total) by mouth daily with breakfast. 02/07/24   Pokhrel, Vernal, MD  sulfamethoxazole -trimethoprim  (BACTRIM  DS) 800-160 MG tablet Take 1 tablet by mouth 3 (three) times a week. 02/16/24   Mannam, Praveen, MD    Allergies: Acetaminophen , Amoxicillin , Ibuprofen , Statins, and Zetia  [ezetimibe ]    Review of Systems  Unable to perform ROS: Severe respiratory distress    Updated Vital Signs BP (!) 147/99   Pulse (!) 106   Resp (!) 26   Ht 5' 1 (1.549 m)   Wt 72 kg   SpO2 100%   BMI 29.99 kg/m   Physical Exam Vitals and nursing note reviewed.  Constitutional:      General: She is in acute distress.     Appearance: She is well-developed. She is ill-appearing.     Comments: Patient is stridorous, tachypneic, appears to be in moderate respiratory discomfort.  HENT:     Head: Atraumatic.  Eyes:     Conjunctiva/sclera: Conjunctivae normal.  Cardiovascular:     Rate and Rhythm: Tachycardia present.     Pulses: Normal pulses.     Heart sounds: Normal heart sounds.  Pulmonary:     Effort: Pulmonary effort is normal.     Breath sounds: Wheezing and rhonchi present.  Abdominal:     Palpations: Abdomen is soft.  Musculoskeletal:     Cervical back: Neck supple.  Skin:    Findings: No rash.  Neurological:     Mental Status: She is alert. She is disoriented.  Psychiatric:        Mood and Affect: Mood normal.     (all labs ordered are listed, but only abnormal results are displayed) Labs Reviewed  BASIC METABOLIC PANEL WITH GFR - Abnormal; Notable for the  following components:      Result Value   CO2 35 (*)    All other components within normal limits  CBC WITH DIFFERENTIAL/PLATELET - Abnormal; Notable for the following components:   Hemoglobin 10.5 (*)    MCH 25.3 (*)    MCHC 28.8 (*)    All other components within normal limits  BLOOD GAS, VENOUS - Abnormal; Notable for the following components:   pCO2, Ven 65 (*)    Bicarbonate 39.3 (*)    Acid-Base Excess 11.6 (*)    All other components within normal limits  RESP PANEL BY RT-PCR (RSV, FLU A&B, COVID)  RVPGX2  MAGNESIUM   PHOSPHORUS  TROPONIN T, HIGH SENSITIVITY  TROPONIN T, HIGH SENSITIVITY    EKG: None  Date: 03/02/2024  Rate: 88  Rhythm: normal sinus rhythm  QRS Axis: normal  Intervals: normal  ST/T Wave abnormalities: normal  Conduction Disutrbances: none  Narrative Interpretation:   Old EKG Reviewed: No significant changes noted    Radiology: DG Chest Port 1 View Result Date: 03/02/2024 CLINICAL DATA:  Shortness of breath. EXAM: PORTABLE CHEST 1 VIEW COMPARISON:  02/02/2024 FINDINGS: The lungs are clear without focal pneumonia, edema, pneumothorax or pleural effusion. Similar bibasilar atelectasis. Cardiopericardial silhouette is at upper limits of normal for size. No acute bony abnormality. Telemetry leads overlie the chest. IMPRESSION: Bibasilar atelectasis without other acute cardiopulmonary findings. Electronically Signed   By: Camellia Candle M.D.   On: 03/02/2024 11:20     .Critical Care  Performed by: Nivia Colon, PA-C Authorized by: Nivia Colon, PA-C   Critical care provider statement:    Critical care time (minutes):  30   Critical care was time spent personally by me on the following activities:  Development of treatment plan with patient or surrogate, discussions with consultants, evaluation of patient's response to treatment, examination of patient, ordering and review of laboratory studies, ordering and review of radiographic studies, ordering and  performing treatments and interventions, pulse oximetry, re-evaluation of patient's condition and review of old charts    Medications Ordered in the ED  albuterol  (PROVENTIL ) (2.5 MG/3ML) 0.083% nebulizer solution 10 mg ( Nebulization Canceled Entry 03/02/24 1110)  potassium chloride  SA (KLOR-CON  M) CR tablet 40 mEq (has no administration in time range)  ipratropium-albuterol  (DUONEB) 0.5-2.5 (3) MG/3ML nebulizer solution 3 mL (3 mLs Nebulization Given 03/02/24 1101)  methylPREDNISolone  sodium succinate (SOLU-MEDROL ) 125 mg/2 mL injection 125 mg (125 mg Intravenous Given 03/02/24 1120)  magnesium  sulfate IVPB 2 g 50 mL (0 g Intravenous Stopped 03/02/24 1215)  albuterol  (PROVENTIL ) (2.5 MG/3ML) 0.083% nebulizer solution (10 mg  Given 03/02/24 1109)                                    Medical Decision Making Amount and/or Complexity of Data Reviewed Labs: ordered.  Radiology: ordered.  Risk Prescription drug management. Decision regarding hospitalization.   BP (!) 147/99   Pulse (!) 106   Temp 98.1 F (36.7 C) (Oral)   Resp (!) 26   Ht 5' 1 (1.549 m)   Wt 72 kg   SpO2 100%   BMI 29.99 kg/m   75:56 AM  63 year old female with multiple comorbidity which includes COPD who is currently on 5 L of oxygen  chronically at home, CAD, polysubstance use, GERD, hypertension who drove herself here from home for evaluation of shortness of breath.  Patient endorsed progressive worsening shortness of breath since last night not improved with her home oxygen .  States symptoms felt similar to prior COPD exacerbation.  History is limited as patient is having difficulty breathing.  Level 5 caveat applies.  She denies having fever or chills denies any other environmental changes.  Denies having chest pain.  Exam notable for patient in moderate respiratory discomfort, stridorous, actively wheezing, expiratory is tachypneic and tachycardic.  Respiratory therapist is in the room, will start DuoNeb,  magnesium , Solu-Medrol , place patient on cardiac monitor, workup initiated.  -Labs ordered, independently viewed and interpreted by me.  Labs remarkable for normal troponin, normal pH, electrolyte panels are reassuring, -The patient was maintained on a cardiac monitor.  I personally viewed and interpreted the cardiac monitored which showed an underlying rhythm of: Sinus tachycardia -Imaging independently viewed and interpreted by me and I agree with radiologist's interpretation.  Result remarkable for chest x-ray shows bibasilar atelectasis without acute finding -This patient presents to the ED for concern of shortness of breath, this involves an extensive number of treatment options, and is a complaint that carries with it a high risk of complications and morbidity.  The differential diagnosis includes COPD exacerbation, CHF, asthma, PE, MI, pneumonia -Co morbidities that complicate the patient evaluation includes COPD -Treatment includes Solu-Medrol , continuous nebulizer, magnesium  supplemental oxygen , nonrebreather mask -Reevaluation of the patient after these medicines showed that the patient improved -PCP office notes or outside notes reviewed -Discussion with specialist [facility] Dr. Cloteal who agrees to admit pt for obs due to COPD exacerbation -Escalation to admission/observation considered: patient is agreeable with admission.      Final diagnoses:  COPD exacerbation [facility])    ED Discharge Orders     None          Nivia Colon, PA-C 03/02/24 1302    Patsey Lot, MD 03/03/24 [phone]
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
## 2024-03-03 ENCOUNTER — Encounter (HOSPITAL_COMMUNITY): Payer: Self-pay | Admitting: Internal Medicine

## 2024-03-03 DIAGNOSIS — J441 Chronic obstructive pulmonary disease with (acute) exacerbation: Secondary | ICD-10-CM | POA: Diagnosis not present

## 2024-03-03 LAB — RESPIRATORY PANEL BY PCR

## 2024-03-03 MED ORDER — CARMEX CLASSIC LIP BALM EX OINT
TOPICAL_OINTMENT | CUTANEOUS | Status: DC | PRN
Start: 2024-03-03 — End: 2024-03-04
  Filled 2024-03-03: qty 10

## 2024-03-03 MED ORDER — FERROUS SULFATE 325 (65 FE) MG PO TABS
325.0000 mg | ORAL_TABLET | Freq: Every day | ORAL | Status: DC
  Administered 2024-03-04: 325 mg via ORAL
  Filled 2024-03-03: qty 1

## 2024-03-03 NOTE — Progress Notes (Signed)
 Progress Note   Patient: [name] FMW:[id number] DOB: 01-Nov-1960 DOA: 03/02/2024     0 DOS: the patient was seen and examined on 03/03/2024   Brief hospital course: H&P HPI HPI: Velecia Ovitt is a 63 y.o. female with medical history significant of COPD, emphysema, history of pneumonia, history of acute respiratory failure, history of chronic respiratory failure with hypoxia, history of tobacco use, aortic atherosclerosis, CAD, chronic headache, hypertension, GERD, hep C, hyperlipidemia, hypokalemia, cerumen impaction, influenza A, history of obesity who was brought to the emergency department via EMS due to progressively worse dyspnea associated with nonproductive cough, wheezing and fatigue for the past 2 days. He denied fever, chills, rhinorrhea, sore throat or hemoptysis.  No chest pain, palpitations, diaphoresis, PND, orthopnea or pitting edema of the lower extremities.  No abdominal pain, nausea, emesis, diarrhea, constipation, melena or hematochezia.  No flank pain, dysuria, frequency or hematuria.  No polyuria, polydipsia, polyphagia or blurred vision.   Assessment and Plan: Acute COPD exacerbation Chronic hypoxic RF  Increased sputum production and worsening dyspnea.  She reamins on her home O2 of 5L [place].  Continue steroids and antibiotics F/u RVP   GERD  Continue PPI   CAD  HLD Not on antiplt or lipid lowering medication  Normocytic anemia  Iron deficiency  Continue to monitor  Start PO supplementation   HTN Continue to monitor BP   H/o tobacco use      Subjective: Patient was real short of breath. Used inhalers and nebulizers still short of breath. Started Thursday night progressively worse. Friday morning.   Granddaughter was sick, coughing and sneezing and sore throat. Not tested for any virus. Got allergy  medicnes.    Coughing up more phlegm than usual prior to presenting to the hospital.  Feels shortness of breath is improved currently.   Physical  Exam: Vitals:   03/02/24 1618 03/02/24 2029 03/03/24 0016 03/03/24 0407  BP: (!) 143/92 131/85 116/81 129/85  Pulse: 87 79 77 84  Resp: (!) 22 20 (!) 23 18  Temp: 98.4 F (36.9 C) 97.6 F (36.4 C) 98.1 F (36.7 C) 97.9 F (36.6 C)  TempSrc: Oral Oral Oral Oral  SpO2: 94% 100% 100% 100%  Weight:      Height:       Physical Exam  Constitutional: In no distress.  Cardiovascular: Normal rate, regular rhythm. No lower extremity edema  Pulmonary: Non labored breathing on [place] no wheezing or rales.   Abdominal: Soft. Non distended and non tender Musculoskeletal: Normal range of motion.     Neurological: Alert and oriented to person, place, and time. Non focal  Skin: Skin is warm and dry.   Data Reviewed:     Latest Ref Rng & Units 03/02/2024   11:05 AM 02/03/2024    5:30 AM 02/02/2024    8:38 AM  CBC  WBC 4.0 - 10.5 K/uL 9.5  7.8  8.4   Hemoglobin 12.0 - 15.0 g/dL 89.4  9.3  9.2   Hematocrit 36.0 - 46.0 % 36.4  32.1  31.6   Platelets 150 - 400 K/uL 360  276  280       Latest Ref Rng & Units 03/02/2024   11:05 AM 02/03/2024    5:30 AM 02/02/2024    8:38 AM  BMP  Glucose 70 - 99 mg/dL 87  82  92   BUN 8 - 23 mg/dL 11  11  9    Creatinine 0.44 - 1.00 mg/dL 9.29  9.32  9.48  Sodium 135 - 145 mmol/L 143  143  144   Potassium 3.5 - 5.1 mmol/L 3.8  4.5  3.5   Chloride 98 - 111 mmol/L 98  102  101   CO2 22 - 32 mmol/L 35  33  30   Calcium 8.9 - 10.3 mg/dL 9.6  8.8  8.8      Family Communication: None  Disposition: Status is: Observation The patient remains OBS appropriate and will d/c before 2 midnights.  Planned Discharge Destination: Home    Time spent: 35 minutes  Author: Alban Pepper, MD 03/03/2024 10:00 AM  For on call review [email or web address].

## 2024-03-04 ENCOUNTER — Other Ambulatory Visit: Payer: Self-pay

## 2024-03-04 DIAGNOSIS — J441 Chronic obstructive pulmonary disease with (acute) exacerbation: Secondary | ICD-10-CM | POA: Diagnosis not present

## 2024-03-04 MED ORDER — POLYETHYLENE GLYCOL 3350 17 G PO PACK: 17.0000 g | PACK | Freq: Every day | ORAL | Status: DC

## 2024-03-04 MED ORDER — PREDNISONE 20 MG PO TABS: 40.0000 mg | ORAL_TABLET | Freq: Every day | ORAL | 0 refills | Status: DC

## 2024-03-04 MED ORDER — CEFDINIR 300 MG PO CAPS: 600.0000 mg | ORAL_CAPSULE | Freq: Every day | ORAL | 0 refills | Status: DC

## 2024-03-04 MED ORDER — DOXYCYCLINE HYCLATE 100 MG PO TABS: 100.0000 mg | ORAL_TABLET | Freq: Two times a day (BID) | ORAL | 0 refills | Status: AC

## 2024-03-04 MED ORDER — FERROUS SULFATE 325 (65 FE) MG PO TABS: 325.0000 mg | ORAL_TABLET | Freq: Every day | ORAL | 3 refills | Status: AC

## 2024-03-04 MED ORDER — PREDNISONE 20 MG PO TABS: ORAL_TABLET | ORAL | 0 refills | Status: DC

## 2024-03-04 MED ORDER — NITROGLYCERIN 0.4 MG SL SUBL: 0.4000 mg | SUBLINGUAL_TABLET | SUBLINGUAL | 6 refills | Status: AC | PRN

## 2024-03-04 NOTE — Discharge Instructions (Addendum)
 You have been started on an antibiotic called doxycycline  which you will take for 3 days.    Your prednisone  was increased to 40mg  for 5 days, then resume your 20mg  dose.    Please continue to take your medicine called Bactrim  as prescribed by your lung doctors to prevent an infection while you are on prednisone .    Please follow up with your PCP or lung doctor in 1-2 weeks.

## 2024-03-04 NOTE — Plan of Care (Signed)
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
## 2024-03-04 NOTE — Progress Notes (Signed)
 AVS and discharge instructions reviewed w/ patient.

## 2024-03-06 ENCOUNTER — Other Ambulatory Visit (HOSPITAL_COMMUNITY): Payer: Self-pay

## 2024-03-06 ENCOUNTER — Other Ambulatory Visit: Payer: Self-pay

## 2024-03-06 NOTE — Progress Notes (Signed)
 Specialty Pharmacy Refill Coordination Note  [name] is a 63 y.o. female contacted today regarding refills of specialty medication(s) Mepolizumab  (Nucala )   Patient requested Delivery   Delivery date: 03/12/24   Verified address: [address] [place] [address]   Medication will be filled on 10.27.25.

## 2024-03-08 ENCOUNTER — Telehealth (HOSPITAL_COMMUNITY): Payer: Self-pay

## 2024-03-08 NOTE — Telephone Encounter (Signed)
 Pt insurance is active and benefits verified through [facility]. Co-pay $0, DED $200/$200 met, out of pocket $2,500/$2,500 met, co-insurance 20%. YES pre-authorization required. 03/08/2024 @ 9:56am, spoke with Lorette HERO., REF# Lorette HERO. 03/08/2024.  2ndary insurance is active and benefits verified through [employer]. Co-pay $0, DED $257/unknown met, out of pocket $0/$0 met, co-insurance 20%. No pre-authorization required.  Called Dr. Jiles office to have them submit prior auth request to [email or web address].

## 2024-03-08 NOTE — Discharge Summary (Signed)
 Physician Discharge Summary   Patient: [name] MRN: [id number] DOB: 30-Jul-1960  Admit date:     03/02/2024  Discharge date: 03/04/2024  Discharge Physician: Alban Pepper   PCP: Rosalea Rosina [name], [place]   Recommendations at discharge:    Discuss if antiplatelet should be started.   Discharge Diagnoses: Principal Problem:   COPD with acute exacerbation (HCC) Active Problems:   GERD (gastroesophageal reflux disease)   CAD (coronary artery disease)   Hyperlipidemia   Essential hypertension   History of tobacco use   Acute on chronic respiratory failure with hypoxia and hypercapnia (HCC)  Resolved Problems:   * No resolved hospital problems. *  Hospital Course: H&P HPI HPI: [name] is a 63 y.o. female with medical history significant of COPD, emphysema, history of pneumonia, history of acute respiratory failure, history of chronic respiratory failure with hypoxia, history of tobacco use, aortic atherosclerosis, CAD, chronic headache, hypertension, GERD, hep C, hyperlipidemia, hypokalemia, cerumen impaction, influenza A, history of obesity who was brought to the emergency department via EMS due to progressively worse dyspnea associated with nonproductive cough, wheezing and fatigue for the past 2 days. He denied fever, chills, rhinorrhea, sore throat or hemoptysis.  No chest pain, palpitations, diaphoresis, PND, orthopnea or pitting edema of the lower extremities.  No abdominal pain, nausea, emesis, diarrhea, constipation, melena or hematochezia.  No flank pain, dysuria, frequency or hematuria.  No polyuria, polydipsia, polyphagia or blurred vision.   Assessment and Plan:   Acute COPD exacerbation Chronic hypoxic RF  Presented with Increased sputum production and worsening dyspnea.  Unclear what precipitated this, Her RPP was negative. She remained on her home oxygen  requirement of 5L . She is also on prednisone  20mg  daily for her copd as well as bactrim  for PJP  prophylaxis. Her symptoms improved with increased steroids. She discharged with instructions to take prednisone  40mg  for 5 days then to decrease to her home 20mg  dose thereafter. She will take a course of doxycycline  and continue bactrim  for pjp prophylaxis. She will also continue her home inhalers.    GERD  Continued on homePPI    CAD  HLD Not on antiplt, on repatha . Will need to follow up with her PCP regarding this.    Normocytic anemia  Iron deficiency     Latest Ref Rng & Units 03/02/2024   11:05 AM 02/03/2024    5:30 AM 02/02/2024    8:38 AM  CBC  WBC 4.0 - 10.5 K/uL 9.5  7.8  8.4   Hemoglobin 12.0 - 15.0 g/dL 89.4  9.3  9.2   Hematocrit 36.0 - 46.0 % 36.4  32.1  31.6   Platelets 150 - 400 K/uL 360  276  280   Iron/TIBC/Ferritin/ %Sat    Component Value Date/Time   IRON 22 (L) 02/02/2024 1100   TIBC 442 02/02/2024 1100   FERRITIN 10 (L) 02/02/2024 1100   IRONPCTSAT 5 (L) 02/02/2024 1100   -She was started on oral iron.    HTN Continue to monitor BP    H/o tobacco use         Consultants: None Procedures performed: None  Disposition: Home Diet recommendation:  Regular diet DISCHARGE MEDICATION: Allergies as of 03/04/2024       Reactions   Acetaminophen  Shortness Of Breath   Amoxicillin  Shortness Of Breath   Ibuprofen  Shortness Of Breath   Statins Other (See Comments)   Worsened the patient's COPD   Zetia  [ezetimibe ] Other (See Comments)   Worsened the patient's COPD  Medication List     STOP taking these medications    ofloxacin 0.3 % ophthalmic solution Commonly known as: OCUFLOX       TAKE these medications    albuterol  (2.5 MG/3ML) 0.083% nebulizer solution Commonly known as: PROVENTIL  INHALE 3 ML BY NEBULIZATION EVERY 6 HOURS AS NEEDED FOR WHEEZING OR SHORTNESS OF BREATH   albuterol  108 (90 Base) MCG/ACT inhaler Commonly known as: VENTOLIN  HFA Inhale 2 puffs into the lungs every 6 (six) hours as needed for wheezing or  shortness of breath.   ferrous sulfate 325 (65 FE) MG tablet Take 1 tablet (325 mg total) by mouth daily with breakfast.   ipratropium-albuterol  0.5-2.5 (3) MG/3ML Soln Commonly known as: DUONEB Take 3 mLs by nebulization every 6 (six) hours. What changed: when to take this   nitroGLYCERIN  0.4 MG SL tablet Commonly known as: NITROSTAT  Place 1 tablet (0.4 mg total) under the tongue every 5 (five) minutes as needed.   Nucala  100 MG/ML Soaj Generic drug: Mepolizumab  Inject 1 mL (100 mg total) into the skin every 28 (twenty-eight) days.   omeprazole  20 MG capsule Commonly known as: PRILOSEC TAKE 1 CAPSULE BY MOUTH EVERY DAY   OXYGEN  Inhale 5 L/min into the lungs daily as needed (for shortness of breath).   polyethylene glycol 17 g packet Commonly known as: MiraLax  Take 17 g by mouth daily.   prednisoLONE acetate 1 % ophthalmic suspension Commonly known as: PRED FORTE Place 1 drop into the left eye 3 (three) times daily.   predniSONE  20 MG tablet Commonly known as: DELTASONE  Take 2 tablets (40 mg total) by mouth daily with breakfast for 5 days, THEN 1 tablet (20 mg total) daily with breakfast. Start taking on: March 05, 2024 What changed:  medication strength See the new instructions.   Repatha  SureClick 140 MG/ML Soaj Generic drug: Evolocumab  Inject 140 mg into the skin every 14 (fourteen) days.   sulfamethoxazole -trimethoprim  800-160 MG tablet Commonly known as: BACTRIM  DS Take 1 tablet by mouth 3 (three) times a week.   Trelegy Ellipta  200-62.5-25 MCG/ACT Aepb Generic drug: Fluticasone -Umeclidin-Vilant Inhale 1 puff into the lungs daily.       ASK your doctor about these medications    doxycycline  100 MG tablet Commonly known as: VIBRA -TABS Take 1 tablet (100 mg total) by mouth 2 (two) times daily for 3 days. Ask about: Should I take this medication?        Follow-up Information     Rosalea Rosina [name], [place]. Schedule an appointment as soon as possible  for a visit.   Specialty: Physician Assistant Contact information: [address] [place] [address] [phone]                Discharge Exam: Filed Weights   03/02/24 1056  Weight: 72 kg   Physical Exam  Constitutional: In no distress.  Cardiovascular: Normal rate, regular rhythm. No lower extremity edema  Pulmonary: Non labored breathing on [place], no wheezing or rales.   Abdominal: Soft. Non distended and non tender Musculoskeletal: Normal range of motion.     Neurological: Alert and oriented to person, place, and time. Non focal  Skin: Skin is warm and dry.    Condition at discharge: stable  The results of significant diagnostics from this hospitalization (including imaging, microbiology, ancillary and laboratory) are listed below for reference.   Imaging Studies: DG Chest Port 1 View Result Date: 03/02/2024 CLINICAL DATA:  Shortness of breath. EXAM: PORTABLE CHEST 1 VIEW COMPARISON:  02/02/2024  FINDINGS: The lungs are clear without focal pneumonia, edema, pneumothorax or pleural effusion. Similar bibasilar atelectasis. Cardiopericardial silhouette is at upper limits of normal for size. No acute bony abnormality. Telemetry leads overlie the chest. IMPRESSION: Bibasilar atelectasis without other acute cardiopulmonary findings. Electronically Signed   By: Camellia Candle M.D.   On: 03/02/2024 11:20    Microbiology: Results for orders placed or performed during the hospital encounter of 03/02/24  Resp panel by RT-PCR (RSV, Flu A&B, Covid) Anterior Nasal Swab     Status: None   Collection Time: 03/02/24 12:43 PM   Specimen: Anterior Nasal Swab  Result Value Ref Range Status   SARS Coronavirus 2 by RT PCR NEGATIVE NEGATIVE Final    Comment: (NOTE) SARS-CoV-2 target nucleic acids are NOT DETECTED.  The SARS-CoV-2 RNA is generally detectable in upper respiratory specimens during the acute phase of infection. The lowest concentration of SARS-CoV-2 viral copies this  assay can detect is 138 copies/mL. A negative result does not preclude SARS-Cov-2 infection and should not be used as the sole basis for treatment or other patient management decisions. A negative result may occur with  improper specimen collection/handling, submission of specimen other than nasopharyngeal swab, presence of viral mutation(s) within the areas targeted by this assay, and inadequate number of viral copies(<138 copies/mL). A negative result must be combined with clinical observations, patient history, and epidemiological information. The expected result is Negative.  Fact Sheet for Patients:  BloggerCourse.com  Fact Sheet for Healthcare Providers:  SeriousBroker.it  This test is no t yet approved or cleared by the [place]  FDA and  has been authorized for detection and/or diagnosis of SARS-CoV-2 by FDA under an Emergency Use Authorization (EUA). This EUA will remain  in effect (meaning this test can be used) for the duration of the COVID-19 declaration under Section 564(b)(1) of the Act, 21 U.S.C.section 360bbb-3(b)(1), unless the authorization is terminated  or revoked sooner.       Influenza A by PCR NEGATIVE NEGATIVE Final   Influenza B by PCR NEGATIVE NEGATIVE Final    Comment: (NOTE) The Xpert Xpress SARS-CoV-2/FLU/RSV plus assay is intended as an aid in the diagnosis of influenza from Nasopharyngeal swab specimens and should not be used as a sole basis for treatment. Nasal washings and aspirates are unacceptable for Xpert Xpress SARS-CoV-2/FLU/RSV testing.  Fact Sheet for Patients: BloggerCourse.com  Fact Sheet for Healthcare Providers: SeriousBroker.it  This test is not yet approved or cleared by the [place]  FDA and has been authorized for detection and/or diagnosis of SARS-CoV-2 by FDA under an Emergency Use Authorization (EUA). This EUA will  remain in effect (meaning this test can be used) for the duration of the COVID-19 declaration under Section 564(b)(1) of the Act, 21 U.S.C. section 360bbb-3(b)(1), unless the authorization is terminated or revoked.     Resp Syncytial Virus by PCR NEGATIVE NEGATIVE Final    Comment: (NOTE) Fact Sheet for Patients: BloggerCourse.com  Fact Sheet for Healthcare Providers: SeriousBroker.it  This test is not yet approved or cleared by the [place]  FDA and has been authorized for detection and/or diagnosis of SARS-CoV-2 by FDA under an Emergency Use Authorization (EUA). This EUA will remain in effect (meaning this test can be used) for the duration of the COVID-19 declaration under Section 564(b)(1) of the Act, 21 U.S.C. section 360bbb-3(b)(1), unless the authorization is terminated or revoked.  Performed at [facility], [address], [place], [place] [address]   Respiratory (~20 pathogens) panel by PCR  Status: None   Collection Time: 03/03/24 11:02 AM   Specimen: Nasopharyngeal Swab; Respiratory  Result Value Ref Range Status   Adenovirus NOT DETECTED NOT DETECTED Final   Coronavirus 229E NOT DETECTED NOT DETECTED Final    Comment: (NOTE) The Coronavirus on the Respiratory Panel, DOES NOT test for the novel  Coronavirus (2019 nCoV)    Coronavirus HKU1 NOT DETECTED NOT DETECTED Final   Coronavirus NL63 NOT DETECTED NOT DETECTED Final   Coronavirus OC43 NOT DETECTED NOT DETECTED Final   Metapneumovirus NOT DETECTED NOT DETECTED Final   Rhinovirus / Enterovirus NOT DETECTED NOT DETECTED Final   Influenza A NOT DETECTED NOT DETECTED Final   Influenza B NOT DETECTED NOT DETECTED Final   Parainfluenza Virus 1 NOT DETECTED NOT DETECTED Final   Parainfluenza Virus 2 NOT DETECTED NOT DETECTED Final   Parainfluenza Virus 3 NOT DETECTED NOT DETECTED Final   Parainfluenza Virus 4 NOT DETECTED NOT DETECTED  Final   Respiratory Syncytial Virus NOT DETECTED NOT DETECTED Final   Bordetella pertussis NOT DETECTED NOT DETECTED Final   Bordetella Parapertussis NOT DETECTED NOT DETECTED Final   Chlamydophila pneumoniae NOT DETECTED NOT DETECTED Final   Mycoplasma pneumoniae NOT DETECTED NOT DETECTED Final    Comment: Performed at [facility], [address], [place], [place] [address]    Labs: CBC: Recent Labs  Lab 03/02/24 1105  WBC 9.5  NEUTROABS 6.1  HGB 10.5*  HCT 36.4  MCV 87.7  PLT 360   Basic Metabolic Panel: Recent Labs  Lab 03/02/24 1105 03/02/24 1336  NA 143  --   K 3.8  --   CL 98  --   CO2 35*  --   GLUCOSE 87  --   BUN 11  --   CREATININE 0.70  --   CALCIUM 9.6  --   MG  --  2.6*  PHOS  --  2.7   Liver Function Tests: No results for input(s): AST, ALT, ALKPHOS, BILITOT, PROT, ALBUMIN in the last 168 hours. CBG: No results for input(s): GLUCAP in the last 168 hours.  Discharge time spent: greater than 30 minutes.  Signed: Alban Pepper, MD [facility] 03/06/2024

## 2024-03-11 ENCOUNTER — Telehealth: Payer: Self-pay | Admitting: *Deleted

## 2024-03-11 ENCOUNTER — Other Ambulatory Visit: Payer: Self-pay

## 2024-03-11 NOTE — Telephone Encounter (Signed)
 Copied from CRM [id number]. Topic: Clinical - Medication Prior Auth >> Mar 08, 2024 10:02 AM Nathanel DEL wrote: Reason for CRM: Sid w/ [facility] states they received the referral for pulm rehab., but cannot proceed.   Sid is calling b/c pt's insurance requires a PA in order for her to attend pulm rehab.   C/b  for Hayley  [phone] for any questions/concerns   Message sent to [facility] to see if this is some thing they handle.

## 2024-03-12 ENCOUNTER — Telehealth (HOSPITAL_COMMUNITY): Payer: Self-pay

## 2024-03-12 ENCOUNTER — Other Ambulatory Visit

## 2024-03-12 NOTE — Telephone Encounter (Signed)
 Attempted to call patient to schedule pulmonary rehab- phone was hung up after a single ring. Sent MyChart message.

## 2024-03-25 ENCOUNTER — Inpatient Hospital Stay (HOSPITAL_COMMUNITY)
Admission: EM | Admit: 2024-03-25 | Discharge: 2024-03-29 | DRG: 189 | Disposition: A | Discharge status: Home / Self Care | Attending: Internal Medicine | Admitting: Internal Medicine

## 2024-03-25 ENCOUNTER — Encounter (HOSPITAL_COMMUNITY): Payer: Self-pay | Admitting: Student

## 2024-03-25 ENCOUNTER — Emergency Department (HOSPITAL_COMMUNITY)

## 2024-03-25 ENCOUNTER — Other Ambulatory Visit: Payer: Self-pay

## 2024-03-25 DIAGNOSIS — Z7952 Long term (current) use of systemic steroids: Secondary | ICD-10-CM | POA: Diagnosis not present

## 2024-03-25 DIAGNOSIS — E782 Mixed hyperlipidemia: Secondary | ICD-10-CM | POA: Diagnosis not present

## 2024-03-25 DIAGNOSIS — Z825 Family history of asthma and other chronic lower respiratory diseases: Secondary | ICD-10-CM | POA: Diagnosis not present

## 2024-03-25 DIAGNOSIS — I10 Essential (primary) hypertension: Secondary | ICD-10-CM | POA: Diagnosis present

## 2024-03-25 DIAGNOSIS — E785 Hyperlipidemia, unspecified: Secondary | ICD-10-CM | POA: Diagnosis present

## 2024-03-25 DIAGNOSIS — G4734 Idiopathic sleep related nonobstructive alveolar hypoventilation: Secondary | ICD-10-CM

## 2024-03-25 DIAGNOSIS — Z6829 Body mass index (BMI) 29.0-29.9, adult: Secondary | ICD-10-CM | POA: Diagnosis not present

## 2024-03-25 DIAGNOSIS — E669 Obesity, unspecified: Secondary | ICD-10-CM | POA: Diagnosis present

## 2024-03-25 DIAGNOSIS — Z1152 Encounter for screening for COVID-19: Secondary | ICD-10-CM

## 2024-03-25 DIAGNOSIS — J9621 Acute and chronic respiratory failure with hypoxia: Principal | ICD-10-CM | POA: Diagnosis present

## 2024-03-25 DIAGNOSIS — Z9981 Dependence on supplemental oxygen: Secondary | ICD-10-CM

## 2024-03-25 DIAGNOSIS — F419 Anxiety disorder, unspecified: Secondary | ICD-10-CM | POA: Diagnosis present

## 2024-03-25 DIAGNOSIS — Z8249 Family history of ischemic heart disease and other diseases of the circulatory system: Secondary | ICD-10-CM

## 2024-03-25 DIAGNOSIS — I2510 Atherosclerotic heart disease of native coronary artery without angina pectoris: Secondary | ICD-10-CM | POA: Diagnosis present

## 2024-03-25 DIAGNOSIS — Z8616 Personal history of COVID-19: Secondary | ICD-10-CM

## 2024-03-25 DIAGNOSIS — J439 Emphysema, unspecified: Secondary | ICD-10-CM | POA: Diagnosis present

## 2024-03-25 DIAGNOSIS — Z823 Family history of stroke: Secondary | ICD-10-CM | POA: Diagnosis not present

## 2024-03-25 DIAGNOSIS — J441 Chronic obstructive pulmonary disease with (acute) exacerbation: Secondary | ICD-10-CM | POA: Diagnosis present

## 2024-03-25 DIAGNOSIS — Z792 Long term (current) use of antibiotics: Secondary | ICD-10-CM

## 2024-03-25 DIAGNOSIS — Z87891 Personal history of nicotine dependence: Secondary | ICD-10-CM

## 2024-03-25 DIAGNOSIS — K219 Gastro-esophageal reflux disease without esophagitis: Secondary | ICD-10-CM | POA: Diagnosis present

## 2024-03-25 LAB — CBC WITH DIFFERENTIAL/PLATELET
Abs Immature Granulocytes: 0.02 K/uL (ref 0.00–0.07)
Basophils Absolute: 0 K/uL (ref 0.0–0.1)
Basophils Relative: 1 %
Eosinophils Absolute: 0 K/uL (ref 0.0–0.5)
Eosinophils Relative: 0 %
HCT: 36.4 % (ref 36.0–46.0)
Hemoglobin: 10.7 g/dL — ABNORMAL LOW (ref 12.0–15.0)
Immature Granulocytes: 0 %
Lymphocytes Relative: 26 %
Lymphs Abs: 1.9 K/uL (ref 0.7–4.0)
MCH: 26.4 pg (ref 26.0–34.0)
MCHC: 29.4 g/dL — ABNORMAL LOW (ref 30.0–36.0)
MCV: 89.7 fL (ref 80.0–100.0)
Monocytes Absolute: 0.9 K/uL (ref 0.1–1.0)
Monocytes Relative: 12 %
Neutro Abs: 4.5 K/uL (ref 1.7–7.7)
Neutrophils Relative %: 61 %
Platelets: 284 K/uL (ref 150–400)
RBC: 4.06 MIL/uL (ref 3.87–5.11)
RDW: 15.2 % (ref 11.5–15.5)
WBC: 7.4 K/uL (ref 4.0–10.5)
nRBC: 0 % (ref 0.0–0.2)

## 2024-03-25 LAB — BASIC METABOLIC PANEL WITH GFR
Anion gap: 6 (ref 5–15)
BUN: 9 mg/dL (ref 8–23)
CO2: 36 mmol/L — ABNORMAL HIGH (ref 22–32)
Calcium: 8.9 mg/dL (ref 8.9–10.3)
Chloride: 93 mmol/L — ABNORMAL LOW (ref 98–111)
Creatinine, Ser: 0.56 mg/dL (ref 0.44–1.00)
GFR, Estimated: >60 mL/min (ref >60)
Glucose, Bld: 84 mg/dL (ref 70–99)
Potassium: 4.1 mmol/L (ref 3.5–5.1)
Sodium: 135 mmol/L (ref 135–145)

## 2024-03-25 LAB — BLOOD GAS, VENOUS
Acid-Base Excess: 9.2 mmol/L — ABNORMAL HIGH (ref 0.0–2.0)
Bicarbonate: 37.8 mmol/L — ABNORMAL HIGH (ref 20.0–28.0)
O2 Saturation: 57.6 %
Patient temperature: 37
pCO2, Ven: 70 mmHg — ABNORMAL HIGH (ref 44–60)
pH, Ven: 7.34 (ref 7.25–7.43)
pO2, Ven: 35 mmHg (ref 32–45)

## 2024-03-25 LAB — RESP PANEL BY RT-PCR (RSV, FLU A&B, COVID)  RVPGX2
Influenza A by PCR: NEGATIVE
Influenza B by PCR: NEGATIVE
Resp Syncytial Virus by PCR: NEGATIVE
SARS Coronavirus 2 by RT PCR: NEGATIVE

## 2024-03-25 LAB — PRO BRAIN NATRIURETIC PEPTIDE: Pro Brain Natriuretic Peptide: <50 pg/mL (ref <300.0)

## 2024-03-25 LAB — TROPONIN T, HIGH SENSITIVITY: Troponin T High Sensitivity: <15 ng/L (ref 0–19)

## 2024-03-25 MED ORDER — PANTOPRAZOLE SODIUM 40 MG PO TBEC
40.0000 mg | DELAYED_RELEASE_TABLET | Freq: Every day | ORAL | Status: DC
  Administered 2024-03-25 – 2024-03-29 (×5): 40 mg via ORAL
  Filled 2024-03-25 (×5): qty 1

## 2024-03-25 MED ORDER — SODIUM CHLORIDE 0.9 % IV SOLN
100.0000 mg | Freq: Once | INTRAVENOUS | Status: AC
  Administered 2024-03-25: 100 mg via INTRAVENOUS
  Filled 2024-03-25: qty 100

## 2024-03-25 MED ORDER — METHYLPREDNISOLONE SODIUM SUCC 125 MG IJ SOLR
125.0000 mg | Freq: Once | INTRAMUSCULAR | Status: AC
  Administered 2024-03-25: 125 mg via INTRAVENOUS
  Filled 2024-03-25: qty 2

## 2024-03-25 MED ORDER — BUDESON-GLYCOPYRROL-FORMOTEROL 160-9-4.8 MCG/ACT IN AERO
2.0000 | INHALATION_SPRAY | Freq: Two times a day (BID) | RESPIRATORY_TRACT | Status: DC
  Filled 2024-03-25: qty 5.9

## 2024-03-25 MED ORDER — SENNOSIDES-DOCUSATE SODIUM 8.6-50 MG PO TABS: 1.0000 | ORAL_TABLET | Freq: Every evening | ORAL | Status: DC | PRN

## 2024-03-25 MED ORDER — SULFAMETHOXAZOLE-TRIMETHOPRIM 800-160 MG PO TABS
1.0000 | ORAL_TABLET | ORAL | Status: DC
  Administered 2024-03-25 – 2024-03-27 (×2): 1 via ORAL
  Filled 2024-03-25 (×3): qty 1

## 2024-03-25 MED ORDER — MAGNESIUM SULFATE 2 GM/50ML IV SOLN
2.0000 g | Freq: Once | INTRAVENOUS | Status: AC
  Administered 2024-03-25: 2 g via INTRAVENOUS
  Filled 2024-03-25: qty 50

## 2024-03-25 MED ORDER — ENOXAPARIN SODIUM 40 MG/0.4ML IJ SOSY
40.0000 mg | PREFILLED_SYRINGE | INTRAMUSCULAR | Status: DC
  Administered 2024-03-25 – 2024-03-28 (×4): 40 mg via SUBCUTANEOUS
  Filled 2024-03-25 (×4): qty 0.4

## 2024-03-25 MED ORDER — ONDANSETRON HCL 4 MG PO TABS: 4.0000 mg | ORAL_TABLET | Freq: Four times a day (QID) | ORAL | Status: DC | PRN

## 2024-03-25 MED ORDER — AZITHROMYCIN 250 MG PO TABS
500.0000 mg | ORAL_TABLET | Freq: Every day | ORAL | Status: AC
  Administered 2024-03-26 – 2024-03-28 (×3): 500 mg via ORAL
  Filled 2024-03-25 (×3): qty 2

## 2024-03-25 MED ORDER — KETAMINE HCL 50 MG/5ML IJ SOSY
15.0000 mg | PREFILLED_SYRINGE | Freq: Once | INTRAMUSCULAR | Status: AC
  Administered 2024-03-25: 15 mg via INTRAVENOUS
  Filled 2024-03-25: qty 5

## 2024-03-25 MED ORDER — FERROUS SULFATE 325 (65 FE) MG PO TABS
325.0000 mg | ORAL_TABLET | Freq: Every day | ORAL | Status: DC
  Administered 2024-03-26 – 2024-03-29 (×4): 325 mg via ORAL
  Filled 2024-03-25 (×4): qty 1

## 2024-03-25 MED ORDER — ALBUTEROL SULFATE (2.5 MG/3ML) 0.083% IN NEBU
10.0000 mg/h | INHALATION_SOLUTION | Freq: Once | RESPIRATORY_TRACT | Status: AC
  Administered 2024-03-25: 10 mg/h via RESPIRATORY_TRACT
  Filled 2024-03-25: qty 12

## 2024-03-25 MED ORDER — ONDANSETRON HCL 4 MG/2ML IJ SOLN
4.0000 mg | Freq: Once | INTRAMUSCULAR | Status: AC
  Administered 2024-03-25: 4 mg via INTRAVENOUS

## 2024-03-25 MED ORDER — IPRATROPIUM-ALBUTEROL 0.5-2.5 (3) MG/3ML IN SOLN
3.0000 mL | Freq: Four times a day (QID) | RESPIRATORY_TRACT | Status: DC
  Administered 2024-03-25 – 2024-03-26 (×4): 3 mL via RESPIRATORY_TRACT
  Filled 2024-03-25 (×5): qty 3

## 2024-03-25 MED ORDER — ALBUTEROL SULFATE (2.5 MG/3ML) 0.083% IN NEBU
2.5000 mg | INHALATION_SOLUTION | Freq: Four times a day (QID) | RESPIRATORY_TRACT | Status: DC | PRN
  Administered 2024-03-26: 2.5 mg via RESPIRATORY_TRACT
  Filled 2024-03-25: qty 3

## 2024-03-25 MED ORDER — IPRATROPIUM BROMIDE 0.02 % IN SOLN
RESPIRATORY_TRACT | Status: AC
  Filled 2024-03-25: qty 2.5

## 2024-03-25 MED ORDER — ONDANSETRON HCL 4 MG/2ML IJ SOLN
INTRAMUSCULAR | Status: AC
  Filled 2024-03-25: qty 2

## 2024-03-25 MED ORDER — ONDANSETRON HCL 4 MG/2ML IJ SOLN: 4.0000 mg | Freq: Four times a day (QID) | INTRAMUSCULAR | Status: DC | PRN

## 2024-03-25 MED ORDER — IPRATROPIUM BROMIDE 0.02 % IN SOLN
0.5000 mg | Freq: Once | RESPIRATORY_TRACT | Status: AC
  Administered 2024-03-25: 0.5 mg via RESPIRATORY_TRACT

## 2024-03-25 MED ORDER — BISACODYL 5 MG PO TBEC: 5.0000 mg | DELAYED_RELEASE_TABLET | Freq: Every day | ORAL | Status: DC | PRN

## 2024-03-25 NOTE — Progress Notes (Signed)
 RT transported pt from ED to room 1438 without event.

## 2024-03-25 NOTE — Progress Notes (Signed)
   03/25/24 1720  BiPAP/CPAP/SIPAP  $ Non-Invasive Ventilator  Non-Invasive Vent Initial  $ Face Mask Small Yes  BiPAP/CPAP/SIPAP Pt Type Adult  BiPAP/CPAP/SIPAP SERVO  Mask Type Full face mask  Dentures removed? Yes - Given to RN  Mask Size Small  Set Rate 15 breaths/min  Respiratory Rate 22 breaths/min  IPAP 12 cmH20  EPAP 6 cmH2O  Pressure Support 6 cmH20 (PC)  PEEP 6 cmH20  FiO2 (%) 50 %  Minute Ventilation 12.9  Leak 35  Peak Inspiratory Pressure (PIP) 17  Tidal Volume (Vt) 569  Patient Home Machine No  Patient Home Mask No  Patient Home Tubing No  Auto Titrate No  Press High Alarm 25 cmH2O  Device Plugged into RED Power Outlet Yes  BiPAP/CPAP /SiPAP Vitals  Pulse Rate (!) 129  Resp (!) 22  SpO2 100 %  Bilateral Breath Sounds Diminished;Other (Comment) (coarse)  MEWS Score/Color  MEWS Score 3  MEWS Score Color Yellow   Placed on BiPAP at this time, per MD order.

## 2024-03-25 NOTE — Progress Notes (Signed)
 Patient nauseous at this time, Removed from BiPAP. Placed on 6 lpm [place]. MD notified.
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
## 2024-03-25 NOTE — ED Triage Notes (Signed)
 Patient arrived via POV c/o respiratory distress. Family report SOB started this morning and got worse tonight. Hx COPD on 5LNC at home.

## 2024-03-25 NOTE — Progress Notes (Signed)
 Placed patient on HHFNC 50L, 50% FiO2 per MD order. Will continue to monitor.

## 2024-03-25 NOTE — ED Notes (Signed)
 Respiratory panel swab unable to be completed at this time due to breathing treatment being done at this time.

## 2024-03-25 NOTE — ED Provider Notes (Signed)
 [place] EMERGENCY DEPARTMENT AT [facility] Provider Note   CSN: [id number] Arrival date & time: 03/25/24  1710     Patient presents with: Respiratory Distress   [name] is a 63 y.o. female.   63 yo F with a chief complaints of difficulty breathing.  She says this started this morning but got significantly worse throughout the evening.  She has been coughing a little bit.  No fevers.  History is somewhat limited due to respiratory distress.        Prior to Admission medications   Medication Sig Start Date End Date Taking? Authorizing Provider  OXYGEN  Inhale 5 L/min into the lungs daily as needed (for shortness of breath).   Yes [provider]  polyethylene glycol (MIRALAX ) 17 g packet Take 17 g by mouth daily. Patient taking differently: Take 17 g by mouth daily as needed for mild constipation. 03/04/24  Yes Franchot Novel, MD  predniSONE  (DELTASONE ) 20 MG tablet Take 2 tablets (40 mg total) by mouth daily with breakfast for 5 days, THEN 1 tablet (20 mg total) daily with breakfast. 03/05/24 04/09/24 Yes Franchot Novel, MD  albuterol  (PROVENTIL ) (2.5 MG/3ML) 0.083% nebulizer solution INHALE 3 ML BY NEBULIZATION EVERY 6 HOURS AS NEEDED FOR WHEEZING OR SHORTNESS OF BREATH 10/02/23   Mannam, Praveen, MD  albuterol  (VENTOLIN  HFA) 108 (90 Base) MCG/ACT inhaler Inhale 2 puffs into the lungs every 6 (six) hours as needed for wheezing or shortness of breath. 03/01/24   Mannam, Praveen, MD  Evolocumab  (REPATHA  SURECLICK) 140 MG/ML SOAJ Inject 140 mg into the skin every 14 (fourteen) days. 01/23/24   Revankar, Rajan R, MD  ferrous sulfate 325 (65 FE) MG tablet Take 1 tablet (325 mg total) by mouth daily with breakfast. 03/05/24   Franchot Novel, MD  Fluticasone -Umeclidin-Vilant (TRELEGY ELLIPTA ) 200-62.5-25 MCG/ACT AEPB Inhale 1 puff into the lungs daily. Patient not taking: Reported on 03/02/2024 10/23/23 10/22/24  Mannam, Praveen, MD  ipratropium-albuterol   (DUONEB) 0.5-2.5 (3) MG/3ML SOLN Take 3 mLs by nebulization every 6 (six) hours. Patient taking differently: Take 3 mLs by nebulization every 4 (four) hours. 09/23/23   Barbarann Nest, MD  Mepolizumab  (NUCALA ) 100 MG/ML SOAJ Inject 1 mL (100 mg total) into the skin every 28 (twenty-eight) days. 02/15/24   Mannam, Praveen, MD  nitroGLYCERIN  (NITROSTAT ) 0.4 MG SL tablet Place 1 tablet (0.4 mg total) under the tongue every 5 (five) minutes as needed. 03/04/24 06/02/24  Franchot Novel, MD  omeprazole  (PRILOSEC) 20 MG capsule TAKE 1 CAPSULE BY MOUTH EVERY DAY 11/29/23   Mannam, Praveen, MD  prednisoLONE acetate (PRED FORTE) 1 % ophthalmic suspension Place 1 drop into the left eye 3 (three) times daily. 02/07/24   [provider]  sulfamethoxazole -trimethoprim  (BACTRIM  DS) 800-160 MG tablet Take 1 tablet by mouth 3 (three) times a week. 02/16/24   Mannam, Praveen, MD    Allergies: Acetaminophen , Amoxicillin , Ezetimibe , Ibuprofen , and Statins    Review of Systems  Updated Vital Signs BP 128/84   Pulse 76   Temp 97.6 F (36.4 C) (Oral)   Resp 20   SpO2 98%   Physical Exam Vitals and nursing note reviewed.  Constitutional:      General: She is not in acute distress.    Appearance: She is well-developed. She is not diaphoretic.  HENT:     Head: Normocephalic and atraumatic.  Eyes:     Pupils: Pupils are equal, round, and reactive to light.  Cardiovascular:     Rate and Rhythm:  Normal rate and regular rhythm.     Heart sounds: No murmur heard.    No friction rub. No gallop.  Pulmonary:     Breath sounds: No wheezing or rales.     Comments: May feel increased work of breathing without any obvious adventitious lung sounds. Abdominal:     General: There is no distension.     Palpations: Abdomen is soft.     Tenderness: There is no abdominal tenderness.  Musculoskeletal:        General: No tenderness.     Cervical back: Normal range of motion and neck supple.  Skin:    General:  Skin is warm and dry.  Neurological:     Mental Status: She is alert and oriented to person, place, and time.  Psychiatric:        Behavior: Behavior normal.     (all labs ordered are listed, but only abnormal results are displayed) Labs Reviewed  CBC WITH DIFFERENTIAL/PLATELET - Abnormal; Notable for the following components:      Result Value   Hemoglobin 10.7 (*)    MCHC 29.4 (*)    All other components within normal limits  BASIC METABOLIC PANEL WITH GFR - Abnormal; Notable for the following components:   Chloride 93 (*)    CO2 36 (*)    All other components within normal limits  RESP PANEL BY RT-PCR (RSV, FLU A&B, COVID)  RVPGX2  RESPIRATORY PANEL BY PCR  EXPECTORATED SPUTUM ASSESSMENT W GRAM STAIN, RFLX TO RESP C  PRO BRAIN NATRIURETIC PEPTIDE  BASIC METABOLIC PANEL WITH GFR  CBC  BLOOD GAS, VENOUS  TROPONIN T, HIGH SENSITIVITY    EKG: None  Radiology: [facility] Chest Port 1 View Result Date: 03/25/2024 CLINICAL DATA:  Shortness of breath. EXAM: PORTABLE CHEST 1 VIEW COMPARISON:  03/02/2024 and CT chest 11/10/2023. FINDINGS: Trachea is midline. Heart is enlarged. Enlarged pulmonary arteries. Mild bibasilar scarring. Minimal streaky density in the right lower lobe. No pleural fluid. IMPRESSION: 1. Minimal right basilar atelectasis. 2. Enlarged pulmonary arteries, indicative of pulmonary arterial hypertension. Electronically Signed   By: Newell Eke M.D.   On: 03/25/2024 18:05     .Critical Care  Performed by: Emil Share, DO Authorized by: Emil Share, DO   Critical care provider statement:    Critical care time (minutes):  80   Critical care time was exclusive of:  Separately billable procedures and treating other patients   Critical care was time spent personally by me on the following activities:  Development of treatment plan with patient or surrogate, discussions with consultants, evaluation of patient's response to treatment, examination of patient, ordering and  review of laboratory studies, ordering and review of radiographic studies, ordering and performing treatments and interventions, pulse oximetry, re-evaluation of patient's condition and review of old charts   Care discussed with: admitting provider      Medications Ordered in the ED  ondansetron  (ZOFRAN ) 4 MG/2ML injection (  Not Given 03/25/24 1940)  doxycycline  (VIBRAMYCIN ) 100 mg in sodium chloride  0.9 % 250 mL IVPB (has no administration in time range)  senna-docusate (Senokot-S) tablet 1 tablet (has no administration in time range)  bisacodyl  (DULCOLAX) EC tablet 5 mg (has no administration in time range)  ondansetron  (ZOFRAN ) tablet 4 mg (has no administration in time range)    Or  ondansetron  (ZOFRAN ) injection 4 mg (has no administration in time range)  enoxaparin  (LOVENOX ) injection 40 mg (has no administration in time range)  ipratropium-albuterol  (DUONEB) 0.5-2.5 (3)  MG/3ML nebulizer solution 3 mL (3 mLs Nebulization Given 03/25/24 1928)  albuterol  (PROVENTIL ) (2.5 MG/3ML) 0.083% nebulizer solution (10 mg/hr Nebulization Given 03/25/24 1732)  methylPREDNISolone  sodium succinate (SOLU-MEDROL ) 125 mg/2 mL injection 125 mg (125 mg Intravenous Given 03/25/24 1741)  magnesium  sulfate IVPB 2 g 50 mL (0 g Intravenous Stopped 03/25/24 1812)  ketamine 50 mg in normal saline 5 mL (10 mg/mL) syringe (15 mg Intravenous Given 03/25/24 1737)  ondansetron  (ZOFRAN ) injection 4 mg (4 mg Intravenous Given 03/25/24 1755)  ipratropium (ATROVENT ) nebulizer solution 0.5 mg (0.5 mg Nebulization Not Given 03/25/24 1852)                                    Medical Decision Making Amount and/or Complexity of Data Reviewed Labs: ordered. Radiology: ordered.  Risk Prescription drug management. Decision regarding hospitalization.   63 yo F with a chief complaint of difficulty breathing.  She tells me this started this morning and got rapidly worse throughout the day.  On record review patient has  had multiple admissions for COPD.  Will trial breathing treatments.  Start on BiPAP.  Chest x-ray blood work reassess.  Patient appears been for comfortable on BiPAP on repeat assessment.  I was notified that the patient had an episode of emesis.  No reported aspiration.  Placed on a continuous nebulizer therapy.  Continues to have increased work of breathing though objectively seems improved.  Patient also endorses some improvement.  Will place on high flow nasal cannula.  Patient reassessed and doing much better.  Will continue neb therapy.  Discussed with hospitalist for admission.  The patients results and plan were reviewed and discussed.   Any x-rays performed were independently reviewed by myself.   Differential diagnosis were considered with the presenting HPI.  Medications  ondansetron  (ZOFRAN ) 4 MG/2ML injection (  Not Given 03/25/24 1940)  doxycycline  (VIBRAMYCIN ) 100 mg in sodium chloride  0.9 % 250 mL IVPB (has no administration in time range)  senna-docusate (Senokot-S) tablet 1 tablet (has no administration in time range)  bisacodyl  (DULCOLAX) EC tablet 5 mg (has no administration in time range)  ondansetron  (ZOFRAN ) tablet 4 mg (has no administration in time range)    Or  ondansetron  (ZOFRAN ) injection 4 mg (has no administration in time range)  enoxaparin  (LOVENOX ) injection 40 mg (has no administration in time range)  ipratropium-albuterol  (DUONEB) 0.5-2.5 (3) MG/3ML nebulizer solution 3 mL (3 mLs Nebulization Given 03/25/24 1928)  albuterol  (PROVENTIL ) (2.5 MG/3ML) 0.083% nebulizer solution (10 mg/hr Nebulization Given 03/25/24 1732)  methylPREDNISolone  sodium succinate (SOLU-MEDROL ) 125 mg/2 mL injection 125 mg (125 mg Intravenous Given 03/25/24 1741)  magnesium  sulfate IVPB 2 g 50 mL (0 g Intravenous Stopped 03/25/24 1812)  ketamine 50 mg in normal saline 5 mL (10 mg/mL) syringe (15 mg Intravenous Given 03/25/24 1737)  ondansetron  (ZOFRAN ) injection 4 mg (4 mg  Intravenous Given 03/25/24 1755)  ipratropium (ATROVENT ) nebulizer solution 0.5 mg (0.5 mg Nebulization Not Given 03/25/24 1852)    Vitals:   03/25/24 1844 03/25/24 1915 03/25/24 1923 03/25/24 1929  BP:  128/84    Pulse:  76    Resp:  20    Temp:    97.6 F (36.4 C)  TempSrc:    Oral  SpO2: 100% 100% 98%     Final diagnoses:  COPD exacerbation (HCC)    Admission/ observation were discussed with the admitting physician, patient and/or family and they  are comfortable with the plan.       Final diagnoses:  COPD exacerbation [facility])    ED Discharge Orders     None          Emil Share, DO 03/25/24 1944

## 2024-03-25 NOTE — Progress Notes (Signed)
   03/25/24 2105  BiPAP/CPAP/SIPAP  BiPAP/CPAP/SIPAP Pt Type Adult  Reason BIPAP/CPAP not in use Nausea/Vomiting

## 2024-03-26 DIAGNOSIS — J9621 Acute and chronic respiratory failure with hypoxia: Secondary | ICD-10-CM | POA: Diagnosis not present

## 2024-03-26 LAB — RESPIRATORY PANEL BY PCR

## 2024-03-26 LAB — BASIC METABOLIC PANEL WITH GFR
Anion gap: 7 (ref 5–15)
BUN: 8 mg/dL (ref 8–23)
CO2: 33 mmol/L — ABNORMAL HIGH (ref 22–32)
Calcium: 8.9 mg/dL (ref 8.9–10.3)
Chloride: 97 mmol/L — ABNORMAL LOW (ref 98–111)
Creatinine, Ser: 0.7 mg/dL (ref 0.44–1.00)
GFR, Estimated: >60 mL/min (ref >60)
Glucose, Bld: 147 mg/dL — ABNORMAL HIGH (ref 70–99)
Potassium: 4.9 mmol/L (ref 3.5–5.1)
Sodium: 138 mmol/L (ref 135–145)

## 2024-03-26 LAB — CBC
HCT: 33.6 % — ABNORMAL LOW (ref 36.0–46.0)
Hemoglobin: 10 g/dL — ABNORMAL LOW (ref 12.0–15.0)
MCH: 26 pg (ref 26.0–34.0)
MCHC: 29.8 g/dL — ABNORMAL LOW (ref 30.0–36.0)
MCV: 87.5 fL (ref 80.0–100.0)
Platelets: 281 K/uL (ref 150–400)
RBC: 3.84 MIL/uL — ABNORMAL LOW (ref 3.87–5.11)
RDW: 14.8 % (ref 11.5–15.5)
WBC: 6.9 K/uL (ref 4.0–10.5)
nRBC: 0 % (ref 0.0–0.2)

## 2024-03-26 MED ORDER — IPRATROPIUM-ALBUTEROL 0.5-2.5 (3) MG/3ML IN SOLN
3.0000 mL | RESPIRATORY_TRACT | Status: DC
  Administered 2024-03-27 – 2024-03-29 (×15): 3 mL via RESPIRATORY_TRACT
  Filled 2024-03-26 (×16): qty 3

## 2024-03-26 MED ORDER — ALPRAZOLAM 0.25 MG PO TABS
0.2500 mg | ORAL_TABLET | Freq: Every day | ORAL | Status: DC | PRN
  Filled 2024-03-26: qty 1

## 2024-03-26 MED ORDER — OFLOXACIN 0.3 % OP SOLN
1.0000 [drp] | Freq: Four times a day (QID) | OPHTHALMIC | Status: DC
Start: 2024-03-26 — End: 2024-03-30
  Administered 2024-03-26: 1 [drp] via OPHTHALMIC
  Filled 2024-03-26: qty 5

## 2024-03-26 MED ORDER — IPRATROPIUM-ALBUTEROL 0.5-2.5 (3) MG/3ML IN SOLN
3.0000 mL | RESPIRATORY_TRACT | Status: DC | PRN
  Administered 2024-03-26 (×3): 3 mL via RESPIRATORY_TRACT
  Filled 2024-03-26 (×4): qty 3

## 2024-03-26 MED ORDER — ALBUTEROL SULFATE (2.5 MG/3ML) 0.083% IN NEBU: 2.5000 mg | INHALATION_SOLUTION | RESPIRATORY_TRACT | Status: DC | PRN

## 2024-03-26 MED ORDER — PREDNISONE 20 MG PO TABS
40.0000 mg | ORAL_TABLET | Freq: Every day | ORAL | Status: DC
  Administered 2024-03-26 – 2024-03-29 (×4): 40 mg via ORAL
  Filled 2024-03-26 (×4): qty 2

## 2024-03-26 MED ORDER — PREDNISOLONE ACETATE 1 % OP SUSP
1.0000 [drp] | Freq: Four times a day (QID) | OPHTHALMIC | Status: DC
  Administered 2024-03-26: 1 [drp] via OPHTHALMIC
  Filled 2024-03-26: qty 5

## 2024-03-26 MED ORDER — BUDESONIDE 0.5 MG/2ML IN SUSP
0.5000 mg | Freq: Two times a day (BID) | RESPIRATORY_TRACT | Status: DC
  Administered 2024-03-26 – 2024-03-29 (×7): 0.5 mg via RESPIRATORY_TRACT
  Filled 2024-03-26 (×7): qty 2

## 2024-03-26 NOTE — Progress Notes (Signed)
 PROGRESS NOTE    [name]  FMW:[id number] DOB: 10-30-1960 DOA: 03/25/2024 PCP: Rosalea Rosina [name], PA   Brief Narrative:  [name] is a 63 y.o. female with medical history significant for prediabetes, HTN, HLD, obesity, OSA, CAD, cardiomyopathy, severe COPD, chronic hypoxic respiratory failure on 5 L [place], hiatal hernia, and GERD who presented to the ED for evaluation of shortness of breath starting abruptly within 24h of admission -admitted for acute on chronic hypoxic respiratory failure from baseline with presumed COPD exacerbation  Assessment & Plan:   Principal Problem:   Acute on chronic respiratory failure with hypoxia (HCC)  Acute on chronic hypoxic respiratory failure Acute COPD exacerbation, POA - Presented with progressive shortness of breath, cough and wheezing - Found to be in respiratory distress initially requiring BiPAP therapy, weaned down to [facility] -baseline requirements 5 L nasal cannula - Increase DuoNeb frequency scheduled and as needed, add budesonide  twice daily nebs on baseline inhaler, add prednisone  40 mg daily, low threshold to transition back to IV steroids if no improvement in the next 24 to 48 hours - This is patient's 6 hospitalization for COPD exacerbation over the last 11 months. On review of pulmonology notes from last month, patient has failed multiple therapies but was recently approved for Nucala  injections - will attempt to have these sent to pharmacy for ongoing administration - She will benefit from a referral to pulmonary rehab at discharge   HTN - Not currently on home regimen - follow while steroids/acute exacerbation subside   PJP prophylaxis - Patient with history of chronic systemic steroid use recently started on Bactrim  for pneumocystis pneumonia prophylaxis - Continue Bactrim  Monday, Wednesday and Friday   CAD/HLD - Continue Repatha  every 2 weeks in the outpatient   Nocturnal dyspnea - OSA suspected, currently pending  in-lab sleep study in the outpatient - Trial of CPAP at bedtime   GERD - Continue PPI  DVT prophylaxis: enoxaparin  (LOVENOX ) injection 40 mg Start: 03/25/24 2200 Code Status:   Code Status: Full Code Family Communication: At bedside(Son)  Status is: Inpatient  Dispo: The patient is from: Home              Anticipated d/c is to: Home              Anticipated d/c date is: 72+ hours              Patient currently not medically stable for discharge given profound hypoxia from baseline  Consultants:  None  Procedures:  None  Antimicrobials:  Bactrim  3 times weekly  Subjective: No acute issues or events overnight respiratory status improving but not yet back to baseline.  Otherwise denies nausea vomiting diarrhea constipation headache fevers chills or chest pain  Objective: Vitals:   03/26/24 0038 03/26/24 0058 03/26/24 0206 03/26/24 0528  BP: 117/79   106/74  Pulse: (!) 46 80 85 78  Resp: 16   17  Temp: 98 F (36.7 C)   98 F (36.7 C)  TempSrc:    Oral  SpO2: 96%  93% 97%  Height:        Intake/Output Summary (Last 24 hours) at 03/26/2024 0752 Last data filed at 03/26/2024 9471 Gross per 24 hour  Intake 374.49 ml  Output 2150 ml  Net -1775.51 ml   There were no vitals filed for this visit.  Examination:  General:  Pleasantly resting in bed, No acute distress. HEENT:  Normocephalic atraumatic.  Sclerae nonicteric, noninjected.  Extraocular movements intact bilaterally. Neck:  Without mass or deformity.  Trachea is midline. Lungs: Diminished bilaterally with overt end expiratory wheeze, no notable rales or rhonchi Heart:  Regular rate and rhythm.  Without murmurs, rubs, or gallops. Abdomen:  Soft, nontender, nondistended.  Without guarding or rebound. Extremities: Without cyanosis, clubbing, edema, or obvious deformity. Skin:  Warm and dry, no erythema.   Data Reviewed: I have personally reviewed following labs and imaging studies  CBC: Recent Labs  Lab  03/25/24 1721 03/26/24 0447  WBC 7.4 6.9  NEUTROABS 4.5  --   HGB 10.7* 10.0*  HCT 36.4 33.6*  MCV 89.7 87.5  PLT 284 281   Basic Metabolic Panel: Recent Labs  Lab 03/25/24 1721 03/26/24 0447  NA 135 138  K 4.1 4.9  CL 93* 97*  CO2 36* 33*  GLUCOSE 84 147*  BUN 9 8  CREATININE 0.56 0.70  CALCIUM 8.9 8.9   GFR: CrCl cannot be calculated (Unknown ideal weight.). Liver Function Tests: No results for input(s): AST, ALT, ALKPHOS, BILITOT, PROT, ALBUMIN in the last 168 hours. No results for input(s): LIPASE, AMYLASE in the last 168 hours. No results for input(s): AMMONIA in the last 168 hours. Coagulation Profile: No results for input(s): INR, PROTIME in the last 168 hours. Cardiac Enzymes: No results for input(s): CKTOTAL, CKMB, CKMBINDEX, TROPONINI in the last 168 hours. BNP (last 3 results) Recent Labs    02/02/24 0838 03/25/24 1721  PROBNP <50.0 <50.0   HbA1C: No results for input(s): HGBA1C in the last 72 hours. CBG: No results for input(s): GLUCAP in the last 168 hours. Lipid Profile: No results for input(s): CHOL, HDL, LDLCALC, TRIG, CHOLHDL, LDLDIRECT in the last 72 hours. Thyroid  Function Tests: No results for input(s): TSH, T4TOTAL, FREET4, T3FREE, THYROIDAB in the last 72 hours. Anemia Panel: No results for input(s): VITAMINB12, FOLATE, FERRITIN, TIBC, IRON, RETICCTPCT in the last 72 hours. Sepsis Labs: No results for input(s): PROCALCITON, LATICACIDVEN in the last 168 hours.  Recent Results (from the past 240 hours)  Resp panel by RT-PCR (RSV, Flu A&B, Covid) Anterior Nasal Swab     Status: None   Collection Time: 03/25/24  7:38 PM   Specimen: Anterior Nasal Swab  Result Value Ref Range Status   SARS Coronavirus 2 by RT PCR NEGATIVE NEGATIVE Final    Comment: (NOTE) SARS-CoV-2 target nucleic acids are NOT DETECTED.  The SARS-CoV-2 RNA is generally detectable in upper  respiratory specimens during the acute phase of infection. The lowest concentration of SARS-CoV-2 viral copies this assay can detect is 138 copies/mL. A negative result does not preclude SARS-Cov-2 infection and should not be used as the sole basis for treatment or other patient management decisions. A negative result may occur with  improper specimen collection/handling, submission of specimen other than nasopharyngeal swab, presence of viral mutation(s) within the areas targeted by this assay, and inadequate number of viral copies(<138 copies/mL). A negative result must be combined with clinical observations, patient history, and epidemiological information. The expected result is Negative.  Fact Sheet for Patients:  bloggercourse.com  Fact Sheet for Healthcare Providers:  seriousbroker.it  This test is no t yet approved or cleared by the [place]  FDA and  has been authorized for detection and/or diagnosis of SARS-CoV-2 by FDA under an Emergency Use Authorization (EUA). This EUA will remain  in effect (meaning this test can be used) for the duration of the COVID-19 declaration under Section 564(b)(1) of the Act, 21 U.S.C.section 360bbb-3(b)(1), unless the authorization is terminated  or revoked  sooner.       Influenza A by PCR NEGATIVE NEGATIVE Final   Influenza B by PCR NEGATIVE NEGATIVE Final    Comment: (NOTE) The Xpert Xpress SARS-CoV-2/FLU/RSV plus assay is intended as an aid in the diagnosis of influenza from Nasopharyngeal swab specimens and should not be used as a sole basis for treatment. Nasal washings and aspirates are unacceptable for Xpert Xpress SARS-CoV-2/FLU/RSV testing.  Fact Sheet for Patients: bloggercourse.com  Fact Sheet for Healthcare Providers: seriousbroker.it  This test is not yet approved or cleared by the [place]  FDA and has been  authorized for detection and/or diagnosis of SARS-CoV-2 by FDA under an Emergency Use Authorization (EUA). This EUA will remain in effect (meaning this test can be used) for the duration of the COVID-19 declaration under Section 564(b)(1) of the Act, 21 U.S.C. section 360bbb-3(b)(1), unless the authorization is terminated or revoked.     Resp Syncytial Virus by PCR NEGATIVE NEGATIVE Final    Comment: (NOTE) Fact Sheet for Patients: bloggercourse.com  Fact Sheet for Healthcare Providers: seriousbroker.it  This test is not yet approved or cleared by the [place]  FDA and has been authorized for detection and/or diagnosis of SARS-CoV-2 by FDA under an Emergency Use Authorization (EUA). This EUA will remain in effect (meaning this test can be used) for the duration of the COVID-19 declaration under Section 564(b)(1) of the Act, 21 U.S.C. section 360bbb-3(b)(1), unless the authorization is terminated or revoked.  Performed at [facility], [address], [place], [place] [address]   Respiratory (~20 pathogens) panel by PCR     Status: None   Collection Time: 03/25/24  7:38 PM   Specimen: Anterior Nasal Swab; Respiratory  Result Value Ref Range Status   Adenovirus NOT DETECTED NOT DETECTED Final   Coronavirus 229E NOT DETECTED NOT DETECTED Final    Comment: (NOTE) The Coronavirus on the Respiratory Panel, DOES NOT test for the novel  Coronavirus (2019 nCoV)    Coronavirus HKU1 NOT DETECTED NOT DETECTED Final   Coronavirus NL63 NOT DETECTED NOT DETECTED Final   Coronavirus OC43 NOT DETECTED NOT DETECTED Final   Metapneumovirus NOT DETECTED NOT DETECTED Final   Rhinovirus / Enterovirus NOT DETECTED NOT DETECTED Final   Influenza A NOT DETECTED NOT DETECTED Final   Influenza B NOT DETECTED NOT DETECTED Final   Parainfluenza Virus 1 NOT DETECTED NOT DETECTED Final   Parainfluenza Virus 2 NOT DETECTED NOT  DETECTED Final   Parainfluenza Virus 3 NOT DETECTED NOT DETECTED Final   Parainfluenza Virus 4 NOT DETECTED NOT DETECTED Final   Respiratory Syncytial Virus NOT DETECTED NOT DETECTED Final   Bordetella pertussis NOT DETECTED NOT DETECTED Final   Bordetella Parapertussis NOT DETECTED NOT DETECTED Final   Chlamydophila pneumoniae NOT DETECTED NOT DETECTED Final   Mycoplasma pneumoniae NOT DETECTED NOT DETECTED Final    Comment: Performed at [facility], [address], [place], [place] [address]         Radiology Studies: DG Chest Port 1 View Result Date: 03/25/2024 CLINICAL DATA:  Shortness of breath. EXAM: PORTABLE CHEST 1 VIEW COMPARISON:  03/02/2024 and CT chest 11/10/2023. FINDINGS: Trachea is midline. Heart is enlarged. Enlarged pulmonary arteries. Mild bibasilar scarring. Minimal streaky density in the right lower lobe. No pleural fluid. IMPRESSION: 1. Minimal right basilar atelectasis. 2. Enlarged pulmonary arteries, indicative of pulmonary arterial hypertension. Electronically Signed   By: Newell Eke M.D.   On: 03/25/2024 18:05  Scheduled Meds:  azithromycin   500 mg Oral Daily   budesonide -glycopyrrolate -formoterol   2 puff Inhalation BID   enoxaparin  (LOVENOX ) injection  40 mg Subcutaneous Q24H   ferrous sulfate  325 mg Oral Q breakfast   ipratropium-albuterol   3 mL Nebulization QID   pantoprazole   40 mg Oral Daily   sulfamethoxazole -trimethoprim   1 tablet Oral Q M,W,F   Continuous Infusions:   LOS: 1 day   Time spent:  Elsie JAYSON Mont[place], DO [facility]  If 7PM-7AM, please contact night-coverage [email or web address]  03/26/2024, 7:52 AM

## 2024-03-26 NOTE — Plan of Care (Signed)

## 2024-03-27 DIAGNOSIS — J9621 Acute and chronic respiratory failure with hypoxia: Secondary | ICD-10-CM | POA: Diagnosis not present

## 2024-03-27 NOTE — Progress Notes (Signed)
   03/27/24 1629  TOC Brief Assessment  Insurance and Status Reviewed  Patient has primary care physician Yes  Home environment has been reviewed home with self  Prior level of function: mod independent  Prior/Current Home Services No current home services  Social Drivers of Health Review SDOH reviewed no interventions necessary  Readmission risk has been reviewed Yes  Transition of care needs no transition of care needs at this time

## 2024-03-27 NOTE — Plan of Care (Signed)

## 2024-03-27 NOTE — Progress Notes (Signed)
 PROGRESS NOTE    [name]  FMW:[id number] DOB: [birth year] DOA: 03/25/2024 PCP: Rosalea Rosina [name], PA   Brief Narrative:  [name] is a 63 y.o. female with medical history significant for prediabetes, HTN, HLD, obesity, OSA, CAD, cardiomyopathy, severe COPD, chronic hypoxic respiratory failure on 5 L [place], hiatal hernia, and GERD who presented to the ED for evaluation of shortness of breath starting abruptly within 24h of admission -admitted for acute on chronic hypoxic respiratory failure from baseline with presumed COPD exacerbation  Assessment & Plan:   Principal Problem:   Acute on chronic respiratory failure with hypoxia (HCC)  Acute on chronic hypoxic respiratory failure Acute COPD exacerbation, POA - Presented with progressive shortness of breath, cough and wheezing - Found to be in respiratory distress initially requiring BiPAP therapy, weaned down to [facility]  - Baseline requirements prior to admission: 5 L nasal cannula - Increase DuoNeb frequency scheduled and as needed, add budesonide  twice daily nebs on baseline inhaler, add prednisone  40 mg daily, low threshold to transition back to IV steroids if no improvement in the next 24 to 48 hours - This is patient's 6 hospitalization for COPD exacerbation over the last 11 months. On review of pulmonology notes from last month, patient has failed multiple therapies but was recently approved for Nucala /Mepolizumab  injections - will attempt to have these sent to pharmacy for ongoing administration - She will benefit from a referral to pulmonary rehab at discharge   HTN - Not currently on home regimen - follow while steroids/acute exacerbation subside   PJP prophylaxis - Patient with history of chronic systemic steroid use recently started on Bactrim  for pneumocystis pneumonia prophylaxis - Continue Bactrim  Monday, Wednesday and Friday   CAD/HLD - Continue Repatha  every 2 weeks in the outpatient   Nocturnal dyspnea -  OSA suspected, currently pending in-lab sleep study in the outpatient - Trial of CPAP at bedtime   GERD - Continue PPI  DVT prophylaxis: enoxaparin  (LOVENOX ) injection 40 mg Start: 03/25/24 2200 Code Status:   Code Status: Full Code Family Communication: At bedside(Son)  Status is: Inpatient  Dispo: The patient is from: Home              Anticipated d/c is to: Home              Anticipated d/c date is: 72+ hours              Patient currently not medically stable for discharge given profound hypoxia from baseline  Consultants:  None  Procedures:  None  Antimicrobials:  Bactrim  3 times weekly  Subjective: No acute issues or events overnight respiratory status improving but not yet back to baseline.  Otherwise denies nausea vomiting diarrhea constipation headache fevers chills or chest pain  Objective: Vitals:   03/26/24 2011 03/26/24 2011 03/27/24 0415 03/27/24 0738  BP:  97/65 121/78   Pulse:  81 79   Resp:  18 20   Temp:  98.4 F (36.9 C) 98 F (36.7 C)   TempSrc:  Oral Oral   SpO2: 96% 98% 100% 92%  Weight:      Height:        Intake/Output Summary (Last 24 hours) at 03/27/2024 0750 Last data filed at 03/27/2024 0418 Gross per 24 hour  Intake 240 ml  Output 1800 ml  Net -1560 ml   Filed Weights   03/26/24 1127  Weight: 72 kg    Examination:  General:  Pleasantly resting in bed, No acute distress. HEENT:  Normocephalic atraumatic.  Sclerae nonicteric, noninjected.  Extraocular movements intact bilaterally. Neck:  Without mass or deformity.  Trachea is midline. Lungs: Diminished bilaterally with overt end expiratory wheeze, no notable rales or rhonchi Heart:  Regular rate and rhythm.  Without murmurs, rubs, or gallops. Abdomen:  Soft, nontender, nondistended.  Without guarding or rebound. Extremities: Without cyanosis, clubbing, edema, or obvious deformity. Skin:  Warm and dry, no erythema.   Data Reviewed: I have personally reviewed following labs  and imaging studies  CBC: Recent Labs  Lab 03/25/24 1721 03/26/24 0447  WBC 7.4 6.9  NEUTROABS 4.5  --   HGB 10.7* 10.0*  HCT 36.4 33.6*  MCV 89.7 87.5  PLT 284 281   Basic Metabolic Panel: Recent Labs  Lab 03/25/24 1721 03/26/24 0447  NA 135 138  K 4.1 4.9  CL 93* 97*  CO2 36* 33*  GLUCOSE 84 147*  BUN 9 8  CREATININE 0.56 0.70  CALCIUM 8.9 8.9   GFR: Estimated Creatinine Clearance: 63.6 mL/min (by C-G formula based on SCr of 0.7 mg/dL). Liver Function Tests: No results for input(s): AST, ALT, ALKPHOS, BILITOT, PROT, ALBUMIN in the last 168 hours. No results for input(s): LIPASE, AMYLASE in the last 168 hours. No results for input(s): AMMONIA in the last 168 hours. Coagulation Profile: No results for input(s): INR, PROTIME in the last 168 hours. Cardiac Enzymes: No results for input(s): CKTOTAL, CKMB, CKMBINDEX, TROPONINI in the last 168 hours. BNP (last 3 results) Recent Labs    02/02/24 0838 03/25/24 1721  PROBNP <50.0 <50.0   HbA1C: No results for input(s): HGBA1C in the last 72 hours. CBG: No results for input(s): GLUCAP in the last 168 hours. Lipid Profile: No results for input(s): CHOL, HDL, LDLCALC, TRIG, CHOLHDL, LDLDIRECT in the last 72 hours. Thyroid  Function Tests: No results for input(s): TSH, T4TOTAL, FREET4, T3FREE, THYROIDAB in the last 72 hours. Anemia Panel: No results for input(s): VITAMINB12, FOLATE, FERRITIN, TIBC, IRON, RETICCTPCT in the last 72 hours. Sepsis Labs: No results for input(s): PROCALCITON, LATICACIDVEN in the last 168 hours.  Recent Results (from the past 240 hours)  Resp panel by RT-PCR (RSV, Flu A&B, Covid) Anterior Nasal Swab     Status: None   Collection Time: 03/25/24  7:38 PM   Specimen: Anterior Nasal Swab  Result Value Ref Range Status   SARS Coronavirus 2 by RT PCR NEGATIVE NEGATIVE Final    Comment: (NOTE) SARS-CoV-2 target nucleic acids  are NOT DETECTED.  The SARS-CoV-2 RNA is generally detectable in upper respiratory specimens during the acute phase of infection. The lowest concentration of SARS-CoV-2 viral copies this assay can detect is 138 copies/mL. A negative result does not preclude SARS-Cov-2 infection and should not be used as the sole basis for treatment or other patient management decisions. A negative result may occur with  improper specimen collection/handling, submission of specimen other than nasopharyngeal swab, presence of viral mutation(s) within the areas targeted by this assay, and inadequate number of viral copies(<138 copies/mL). A negative result must be combined with clinical observations, patient history, and epidemiological information. The expected result is Negative.  Fact Sheet for Patients:  bloggercourse.com  Fact Sheet for Healthcare Providers:  seriousbroker.it  This test is no t yet approved or cleared by the [place]  FDA and  has been authorized for detection and/or diagnosis of SARS-CoV-2 by FDA under an Emergency Use Authorization (EUA). This EUA will remain  in effect (meaning this test can be used) for the duration of  the COVID-19 declaration under Section 564(b)(1) of the Act, 21 U.S.C.section 360bbb-3(b)(1), unless the authorization is terminated  or revoked sooner.       Influenza A by PCR NEGATIVE NEGATIVE Final   Influenza B by PCR NEGATIVE NEGATIVE Final    Comment: (NOTE) The Xpert Xpress SARS-CoV-2/FLU/RSV plus assay is intended as an aid in the diagnosis of influenza from Nasopharyngeal swab specimens and should not be used as a sole basis for treatment. Nasal washings and aspirates are unacceptable for Xpert Xpress SARS-CoV-2/FLU/RSV testing.  Fact Sheet for Patients: bloggercourse.com  Fact Sheet for Healthcare Providers: seriousbroker.it  This test is  not yet approved or cleared by the [place]  FDA and has been authorized for detection and/or diagnosis of SARS-CoV-2 by FDA under an Emergency Use Authorization (EUA). This EUA will remain in effect (meaning this test can be used) for the duration of the COVID-19 declaration under Section 564(b)(1) of the Act, 21 U.S.C. section 360bbb-3(b)(1), unless the authorization is terminated or revoked.     Resp Syncytial Virus by PCR NEGATIVE NEGATIVE Final    Comment: (NOTE) Fact Sheet for Patients: bloggercourse.com  Fact Sheet for Healthcare Providers: seriousbroker.it  This test is not yet approved or cleared by the [place]  FDA and has been authorized for detection and/or diagnosis of SARS-CoV-2 by FDA under an Emergency Use Authorization (EUA). This EUA will remain in effect (meaning this test can be used) for the duration of the COVID-19 declaration under Section 564(b)(1) of the Act, 21 U.S.C. section 360bbb-3(b)(1), unless the authorization is terminated or revoked.  Performed at [facility], [address] [place], [place] [address]   Respiratory (~20 pathogens) panel by PCR     Status: None   Collection Time: 03/25/24  7:38 PM   Specimen: Anterior Nasal Swab; Respiratory  Result Value Ref Range Status   Adenovirus NOT DETECTED NOT DETECTED Final   Coronavirus 229E NOT DETECTED NOT DETECTED Final    Comment: (NOTE) The Coronavirus on the Respiratory Panel, DOES NOT test for the novel  Coronavirus (2019 nCoV)    Coronavirus HKU1 NOT DETECTED NOT DETECTED Final   Coronavirus NL63 NOT DETECTED NOT DETECTED Final   Coronavirus OC43 NOT DETECTED NOT DETECTED Final   Metapneumovirus NOT DETECTED NOT DETECTED Final   Rhinovirus / Enterovirus NOT DETECTED NOT DETECTED Final   Influenza A NOT DETECTED NOT DETECTED Final   Influenza B NOT DETECTED NOT DETECTED Final   Parainfluenza Virus 1 NOT DETECTED  NOT DETECTED Final   Parainfluenza Virus 2 NOT DETECTED NOT DETECTED Final   Parainfluenza Virus 3 NOT DETECTED NOT DETECTED Final   Parainfluenza Virus 4 NOT DETECTED NOT DETECTED Final   Respiratory Syncytial Virus NOT DETECTED NOT DETECTED Final   Bordetella pertussis NOT DETECTED NOT DETECTED Final   Bordetella Parapertussis NOT DETECTED NOT DETECTED Final   Chlamydophila pneumoniae NOT DETECTED NOT DETECTED Final   Mycoplasma pneumoniae NOT DETECTED NOT DETECTED Final    Comment: Performed at [facility], [address] [place], [place] [address]         Radiology Studies: DG Chest Port 1 View Result Date: 03/25/2024 CLINICAL DATA:  Shortness of breath. EXAM: PORTABLE CHEST 1 VIEW COMPARISON:  03/02/2024 and CT chest 11/10/2023. FINDINGS: Trachea is midline. Heart is enlarged. Enlarged pulmonary arteries. Mild bibasilar scarring. Minimal streaky density in the right lower lobe. No pleural fluid. IMPRESSION: 1. Minimal right basilar atelectasis. 2. Enlarged pulmonary arteries, indicative of pulmonary arterial hypertension. Electronically  Signed   By: Newell Eke M.D.   On: 03/25/2024 18:05        Scheduled Meds:  azithromycin   500 mg Oral Daily   budesonide  (PULMICORT ) nebulizer solution  0.5 mg Nebulization BID   enoxaparin  (LOVENOX ) injection  40 mg Subcutaneous Q24H   ferrous sulfate  325 mg Oral Q breakfast   ipratropium-albuterol   3 mL Nebulization Q4H   ofloxacin  1 drop Left Eye QID   pantoprazole   40 mg Oral Daily   prednisoLONE acetate  1 drop Left Eye QID   predniSONE   40 mg Oral Q breakfast   sulfamethoxazole -trimethoprim   1 tablet Oral Q M,W,F   Continuous Infusions:   LOS: 2 days   Time spent:  Elsie JAYSON Montclair, DO [facility]  If 7PM-7AM, please contact night-coverage [email or web address]  03/27/2024, 7:50 AM

## 2024-03-28 ENCOUNTER — Telehealth (HOSPITAL_COMMUNITY): Payer: Self-pay

## 2024-03-28 DIAGNOSIS — J9621 Acute and chronic respiratory failure with hypoxia: Secondary | ICD-10-CM | POA: Diagnosis not present

## 2024-03-28 LAB — BASIC METABOLIC PANEL WITH GFR
Anion gap: 13 (ref 5–15)
BUN: 11 mg/dL (ref 8–23)
CO2: 26 mmol/L (ref 22–32)
Calcium: 9.3 mg/dL (ref 8.9–10.3)
Chloride: 99 mmol/L (ref 98–111)
Creatinine, Ser: 0.63 mg/dL (ref 0.44–1.00)
GFR, Estimated: >60 mL/min (ref >60)
Glucose, Bld: 73 mg/dL (ref 70–99)
Potassium: 4.2 mmol/L (ref 3.5–5.1)
Sodium: 138 mmol/L (ref 135–145)

## 2024-03-28 MED ORDER — TRAZODONE HCL 50 MG PO TABS
25.0000 mg | ORAL_TABLET | Freq: Every day | ORAL | Status: DC
  Filled 2024-03-28: qty 1

## 2024-03-28 NOTE — Progress Notes (Signed)
 Pt ambulated 137ft in the hall with 5L [place]. Pt dropped to 88% upon returning to room but went back to 97% with in 30 seconds of sitting and deep breathing.

## 2024-03-28 NOTE — Progress Notes (Addendum)
 PROGRESS NOTE    [name]  FMW:[id number] DOB: Sep 21, 1960 DOA: 03/25/2024 PCP: Rosalea Rosina [name], PA   Brief Narrative:  [name] is a 63 y.o. female with medical history significant for prediabetes, HTN, HLD, obesity, OSA, CAD, cardiomyopathy, severe COPD, chronic hypoxic respiratory failure on 5 L [place], hiatal hernia, and GERD who presented to the ED for evaluation of shortness of breath starting abruptly within 24h of admission -admitted for acute on chronic hypoxic respiratory failure from baseline with presumed COPD exacerbation  Assessment & Plan:   Principal Problem:   Acute on chronic respiratory failure with hypoxia (HCC)  Acute on chronic hypoxic respiratory failure Acute COPD exacerbation, POA - Presented with progressive shortness of breath, cough and wheezing - Found to be in respiratory distress initially requiring BiPAP therapy, weaned down to [facility]  - Baseline requirements prior to admission: 5 L nasal cannula - Increase DuoNeb frequency scheduled and as needed, add budesonide  twice daily nebs on baseline inhaler, add prednisone  40 mg daily, low threshold to transition back to IV steroids if no improvement in the next 24 to 48 hours - This is patient's 6 hospitalization for COPD exacerbation over the last 11 months. On review of pulmonology notes from last month, patient has failed multiple therapies but was recently approved for Nucala /Mepolizumab  injections - will attempt to have these sent to pharmacy for ongoing administration - She will benefit from a referral to pulmonary rehab at discharge   HTN - Not currently on home regimen given BP is well controlled - follow while steroids/acute exacerbation subside   PJP prophylaxis - Patient with history of chronic systemic steroid use recently started on Bactrim  for pneumocystis pneumonia prophylaxis - Continue Bactrim  Monday, Wednesday and Friday   CAD/HLD - Continue Repatha  every 2 weeks in the  outpatient   Anxiety Patient notes she is no longer on benzo - now on trazodone  - will adjust med rec here  Nocturnal dyspnea - OSA suspected, currently pending in-lab sleep study in the outpatient - Trial of CPAP at bedtime   GERD - Continue PPI  DVT prophylaxis: enoxaparin  (LOVENOX ) injection 40 mg Start: 03/25/24 2200 Code Status:   Code Status: Full Code Family Communication: At bedside(Son)  Status is: Inpatient  Dispo: The patient is from: Home              Anticipated d/c is to: Home              Anticipated d/c date is: 24-48 hours              Patient currently not medically stable for discharge given profound hypoxia from baseline  Consultants:  None  Procedures:  None  Antimicrobials:  Bactrim  3 times weekly  Subjective: No acute issues or events overnight respiratory status improving but not yet back to baseline.  Otherwise denies nausea vomiting diarrhea constipation headache fevers chills or chest pain  Objective: Vitals:   03/27/24 2145 03/28/24 0418 03/28/24 0444 03/28/24 0800  BP: 118/76  119/79   Pulse: 71  63 80  Resp: 19  17 20   Temp: 98.2 F (36.8 C)  98.2 F (36.8 C)   TempSrc: Oral  Oral   SpO2: 100% 97% 100% 97%  Weight:      Height:        Intake/Output Summary (Last 24 hours) at 03/28/2024 0815 Last data filed at 03/28/2024 0445 Gross per 24 hour  Intake 240 ml  Output 2400 ml  Net -2160 ml  Filed Weights   03/26/24 1127  Weight: 72 kg    Examination:  General:  Pleasantly resting in bed, No acute distress. HEENT:  Normocephalic atraumatic.  Sclerae nonicteric, noninjected.  Extraocular movements intact bilaterally. Neck:  Without mass or deformity.  Trachea is midline. Lungs: Diminished bilaterally with overt end expiratory wheeze, no notable rales or rhonchi Heart:  Regular rate and rhythm.  Without murmurs, rubs, or gallops. Abdomen:  Soft, nontender, nondistended.  Without guarding or rebound. Extremities: Without  cyanosis, clubbing, edema, or obvious deformity. Skin:  Warm and dry, no erythema.   Data Reviewed: I have personally reviewed following labs and imaging studies  CBC: Recent Labs  Lab 03/25/24 1721 03/26/24 0447  WBC 7.4 6.9  NEUTROABS 4.5  --   HGB 10.7* 10.0*  HCT 36.4 33.6*  MCV 89.7 87.5  PLT 284 281   Basic Metabolic Panel: Recent Labs  Lab 03/25/24 1721 03/26/24 0447  NA 135 138  K 4.1 4.9  CL 93* 97*  CO2 36* 33*  GLUCOSE 84 147*  BUN 9 8  CREATININE 0.56 0.70  CALCIUM 8.9 8.9   GFR: Estimated Creatinine Clearance: 63.6 mL/min (by C-G formula based on SCr of 0.7 mg/dL).  BNP (last 3 results) Recent Labs    02/02/24 0838 03/25/24 1721  PROBNP <50.0 <50.0   Recent Results (from the past 240 hours)  Resp panel by RT-PCR (RSV, Flu A&B, Covid) Anterior Nasal Swab     Status: None   Collection Time: 03/25/24  7:38 PM   Specimen: Anterior Nasal Swab  Result Value Ref Range Status   SARS Coronavirus 2 by RT PCR NEGATIVE NEGATIVE Final    Comment: (NOTE) SARS-CoV-2 target nucleic acids are NOT DETECTED.  The SARS-CoV-2 RNA is generally detectable in upper respiratory specimens during the acute phase of infection. The lowest concentration of SARS-CoV-2 viral copies this assay can detect is 138 copies/mL. A negative result does not preclude SARS-Cov-2 infection and should not be used as the sole basis for treatment or other patient management decisions. A negative result may occur with  improper specimen collection/handling, submission of specimen other than nasopharyngeal swab, presence of viral mutation(s) within the areas targeted by this assay, and inadequate number of viral copies(<138 copies/mL). A negative result must be combined with clinical observations, patient history, and epidemiological information. The expected result is Negative.  Fact Sheet for Patients:  bloggercourse.com  Fact Sheet for Healthcare Providers:   seriousbroker.it  This test is no t yet approved or cleared by the [place]  FDA and  has been authorized for detection and/or diagnosis of SARS-CoV-2 by FDA under an Emergency Use Authorization (EUA). This EUA will remain  in effect (meaning this test can be used) for the duration of the COVID-19 declaration under Section 564(b)(1) of the Act, 21 U.S.C.section 360bbb-3(b)(1), unless the authorization is terminated  or revoked sooner.       Influenza A by PCR NEGATIVE NEGATIVE Final   Influenza B by PCR NEGATIVE NEGATIVE Final    Comment: (NOTE) The Xpert Xpress SARS-CoV-2/FLU/RSV plus assay is intended as an aid in the diagnosis of influenza from Nasopharyngeal swab specimens and should not be used as a sole basis for treatment. Nasal washings and aspirates are unacceptable for Xpert Xpress SARS-CoV-2/FLU/RSV testing.  Fact Sheet for Patients: bloggercourse.com  Fact Sheet for Healthcare Providers: seriousbroker.it  This test is not yet approved or cleared by the [place]  FDA and has been authorized for detection and/or diagnosis of SARS-CoV-2  by FDA under an Emergency Use Authorization (EUA). This EUA will remain in effect (meaning this test can be used) for the duration of the COVID-19 declaration under Section 564(b)(1) of the Act, 21 U.S.C. section 360bbb-3(b)(1), unless the authorization is terminated or revoked.     Resp Syncytial Virus by PCR NEGATIVE NEGATIVE Final    Comment: (NOTE) Fact Sheet for Patients: bloggercourse.com  Fact Sheet for Healthcare Providers: seriousbroker.it  This test is not yet approved or cleared by the [place]  FDA and has been authorized for detection and/or diagnosis of SARS-CoV-2 by FDA under an Emergency Use Authorization (EUA). This EUA will remain in effect (meaning this test can be used) for  the duration of the COVID-19 declaration under Section 564(b)(1) of the Act, 21 U.S.C. section 360bbb-3(b)(1), unless the authorization is terminated or revoked.  Performed at [facility], [address], [place], [place] [address]   Respiratory (~20 pathogens) panel by PCR     Status: None   Collection Time: 03/25/24  7:38 PM   Specimen: Anterior Nasal Swab; Respiratory  Result Value Ref Range Status   Adenovirus NOT DETECTED NOT DETECTED Final   Coronavirus 229E NOT DETECTED NOT DETECTED Final    Comment: (NOTE) The Coronavirus on the Respiratory Panel, DOES NOT test for the novel  Coronavirus (2019 nCoV)    Coronavirus HKU1 NOT DETECTED NOT DETECTED Final   Coronavirus NL63 NOT DETECTED NOT DETECTED Final   Coronavirus OC43 NOT DETECTED NOT DETECTED Final   Metapneumovirus NOT DETECTED NOT DETECTED Final   Rhinovirus / Enterovirus NOT DETECTED NOT DETECTED Final   Influenza A NOT DETECTED NOT DETECTED Final   Influenza B NOT DETECTED NOT DETECTED Final   Parainfluenza Virus 1 NOT DETECTED NOT DETECTED Final   Parainfluenza Virus 2 NOT DETECTED NOT DETECTED Final   Parainfluenza Virus 3 NOT DETECTED NOT DETECTED Final   Parainfluenza Virus 4 NOT DETECTED NOT DETECTED Final   Respiratory Syncytial Virus NOT DETECTED NOT DETECTED Final   Bordetella pertussis NOT DETECTED NOT DETECTED Final   Bordetella Parapertussis NOT DETECTED NOT DETECTED Final   Chlamydophila pneumoniae NOT DETECTED NOT DETECTED Final   Mycoplasma pneumoniae NOT DETECTED NOT DETECTED Final    Comment: Performed at [facility], [address] [place], [place] [address]    Radiology Studies: No results found.  Scheduled Meds:  azithromycin   500 mg Oral Daily   budesonide  (PULMICORT ) nebulizer solution  0.5 mg Nebulization BID   enoxaparin  (LOVENOX ) injection  40 mg Subcutaneous Q24H   ferrous sulfate  325 mg Oral Q breakfast   ipratropium-albuterol   3 mL Nebulization Q4H    ofloxacin  1 drop Left Eye QID   pantoprazole   40 mg Oral Daily   prednisoLONE acetate  1 drop Left Eye QID   predniSONE   40 mg Oral Q breakfast   sulfamethoxazole -trimethoprim   1 tablet Oral Q M,W,F   Continuous Infusions:   LOS: 3 days   Time spent:  Elsie JAYSON Mont[place], DO [facility]  If 7PM-7AM, please contact night-coverage [email or web address]  03/28/2024, 8:15 AM

## 2024-03-28 NOTE — Progress Notes (Signed)
   03/28/24 2225  BiPAP/CPAP/SIPAP  BiPAP/CPAP/SIPAP Pt Type Adult  Reason BIPAP/CPAP not in use Non-compliant (Patient refused CPAP and BiPAP qhs. Patient wants to wear her nasal cannula while sleeping tonight.)  BiPAP/CPAP /SiPAP Vitals  Resp (!) 21  MEWS Score/Color  MEWS Score 1  MEWS Score Color [place]

## 2024-03-28 NOTE — Telephone Encounter (Signed)
No response from pt in regards to Pulmonary Rehab. Closed referral    

## 2024-03-28 NOTE — Plan of Care (Signed)

## 2024-03-29 DIAGNOSIS — J9621 Acute and chronic respiratory failure with hypoxia: Secondary | ICD-10-CM | POA: Diagnosis not present

## 2024-03-29 MED ORDER — PREDNISONE 10 MG PO TABS: ORAL_TABLET | ORAL | 0 refills | Status: DC

## 2024-03-29 MED ORDER — TRELEGY ELLIPTA 200-62.5-25 MCG/ACT IN AEPB: 1.0000 | INHALATION_SPRAY | Freq: Every day | RESPIRATORY_TRACT | 3 refills | Status: DC

## 2024-03-29 MED ORDER — BUDESONIDE 0.5 MG/2ML IN SUSP: 0.5000 mg | Freq: Two times a day (BID) | RESPIRATORY_TRACT | 0 refills | Status: DC

## 2024-03-29 NOTE — Progress Notes (Signed)
 SATURATION QUALIFICATIONS: (This note is used to comply with regulatory documentation for home oxygen )  Patient Saturations on Room Air at Rest = 85% Patient Saturations on Room Air while Ambulating = (N/A unsafe to do since pt desats at rest on RA)  Patient Saturations on 5 Liters of oxygen  while Ambulating = 95%  Please briefly explain why patient needs home oxygen : to adequately oxygenate

## 2024-03-29 NOTE — Discharge Summary (Signed)
 Physician Discharge Summary  [name] FMW:[id number] DOB: [birth year] DOA: 03/25/2024  PCP: Tami Rosina [name], PA  Admit date: 03/25/2024 Discharge date: 03/29/2024  Admitted From: Home Disposition: Home  Recommendations for Outpatient Follow-up:  Follow up with PCP in 1-2 weeks Follow-up for sleep study in early December as discussed, follow-up with pulmonologist (Dr. Jude) as soon as possible:  Home Health: None Equipment/Devices: None, continue oxygen  at 5 L per previous baseline  Discharge Condition: Stable CODE STATUS: Full Diet recommendation: Low-salt low-fat low-carb diet  Brief/Interim Summary: [name] is a 63 y.o. female with medical history significant for prediabetes, HTN, HLD, obesity, OSA, CAD, cardiomyopathy, severe COPD, chronic hypoxic respiratory failure on 5 L [place], hiatal hernia, and GERD who presented to the ED for evaluation of shortness of breath starting abruptly within 24h of admission -admitted for acute on chronic hypoxic respiratory failure from baseline with presumed COPD exacerbation.  Patient admitted as above with worsening respiratory failure from baseline, requiring BiPAP/heated high flow initially to maintain sats even at rest.  Over the last 72 hours patient's respiratory status has improved somewhat now approaching baseline.  Patient continues to be somewhat short of breath with exertion but maintains without hypoxia on her home 5 L nasal cannula.  Patient will be discharged on prior routine inhaled medications as well as prolonged steroid taper.  Lengthy discussion with patient in regards to possible exposures at home or outside that may be contributing to her recurrent exacerbations.  Hopefully she will be able to follow-up with her pulmonologist in the next few weeks for further maintenance management and discussion in regards to her respiratory status.  She is otherwise at this time stable and agreeable for discharge home given her return  to baseline.  Discharge Diagnoses:  Principal Problem:   Acute on chronic respiratory failure with hypoxia (HCC)  Acute on chronic hypoxic respiratory failure, resolved Acute COPD exacerbation, POA, resolved - Presented with progressive shortness of breath, cough and wheezing - Found to be in respiratory distress initially requiring BiPAP therapy, weaned down to [facility]  - Baseline requirements prior to admission: 5 L nasal cannula - Improving on inhaled budesonide  prednisone  taper and triple therapy Breztri  - This is patient's 6 hospitalization for COPD exacerbation over the last 11 months. On review of pulmonology notes from last month, patient has failed multiple therapies but was recently approved for Nucala /Mepolizumab  injections    HTN - Not currently on any medication and well-controlled, follow-up with PCP for further management   PJP prophylaxis - Patient with history of chronic systemic steroid use recently started on Bactrim  for pneumocystis pneumonia prophylaxis - Continue Bactrim  Monday, Wednesday and Friday**patient reports today that this medicine makes her sick -she appears to have tolerated 2 doses well while here.  Would recommend close follow-up with primary team   CAD/HLD - Continue Repatha  every 2 weeks in the outpatient   Anxiety No longer on benzodiazepines, continue home trazodone    Nocturnal dyspnea - OSA suspected, currently pending in-lab sleep study in the outpatient (early December)   GERD - Continue PPI  Discharge Instructions  Discharge Instructions     Call MD for:  difficulty breathing, headache or visual disturbances   Complete by: As directed    Call MD for:  extreme fatigue   Complete by: As directed    Call MD for:  hives   Complete by: As directed    Call MD for:  persistant dizziness or light-headedness   Complete by: As directed  Call MD for:  persistant nausea and vomiting   Complete by: As directed    Call MD for:  severe  uncontrolled pain   Complete by: As directed    Call MD for:  temperature >100.4   Complete by: As directed    Diet - low sodium heart healthy   Complete by: As directed    Increase activity slowly   Complete by: As directed       Allergies as of 03/29/2024       Reactions   Acetaminophen  Shortness Of Breath   Amoxicillin  Shortness Of Breath   Ezetimibe  Other (See Comments)   Worsened the patient's COPD   Ibuprofen  Shortness Of Breath   Statins Other (See Comments)   Worsened the patient's COPD        Medication List     STOP taking these medications    ALPRAZolam  0.25 MG tablet Commonly known as: XANAX        TAKE these medications    albuterol  (2.5 MG/3ML) 0.083% nebulizer solution Commonly known as: PROVENTIL  INHALE 3 ML BY NEBULIZATION EVERY 6 HOURS AS NEEDED FOR WHEEZING OR SHORTNESS OF BREATH What changed: See the new instructions.   albuterol  108 (90 Base) MCG/ACT inhaler Commonly known as: VENTOLIN  HFA Inhale 2 puffs into the lungs every 6 (six) hours as needed for wheezing or shortness of breath. What changed: Another medication with the same name was changed. Make sure you understand how and when to take each.   budesonide  0.5 MG/2ML nebulizer solution Commonly known as: PULMICORT  Take 2 mLs (0.5 mg total) by nebulization 2 (two) times daily.   ferrous sulfate 325 (65 FE) MG tablet Take 1 tablet (325 mg total) by mouth daily with breakfast.   ipratropium-albuterol  0.5-2.5 (3) MG/3ML Soln Commonly known as: DUONEB Take 3 mLs by nebulization every 6 (six) hours. What changed:  when to take this reasons to take this   nitroGLYCERIN  0.4 MG SL tablet Commonly known as: NITROSTAT  Place 1 tablet (0.4 mg total) under the tongue every 5 (five) minutes as needed. What changed: reasons to take this   Nucala  100 MG/ML Soaj Generic drug: Mepolizumab  Inject 1 mL (100 mg total) into the skin every 28 (twenty-eight) days.   ofloxacin 0.3 % ophthalmic  solution Commonly known as: OCUFLOX Place 1 drop into the left eye 4 (four) times daily.   omeprazole  20 MG capsule Commonly known as: PRILOSEC TAKE 1 CAPSULE BY MOUTH EVERY DAY What changed:  how much to take when to take this   OXYGEN  Inhale 5 L/min into the lungs daily as needed (for shortness of breath).   polyethylene glycol 17 g packet Commonly known as: MiraLax  Take 17 g by mouth daily. What changed:  when to take this reasons to take this   prednisoLONE acetate 1 % ophthalmic suspension Commonly known as: PRED FORTE Place 1 drop into the left eye 4 (four) times daily.   predniSONE  10 MG tablet Commonly known as: DELTASONE  Take 3 tablets (30 mg total) by mouth daily for 5 days, THEN 2 tablets (20 mg total) daily for 5 days, THEN 1 tablet (10 mg total) daily for 5 days. Start taking on: March 29, 2024 What changed:  medication strength See the new instructions.   Repatha  SureClick 140 MG/ML Soaj Generic drug: Evolocumab  Inject 140 mg into the skin every 14 (fourteen) days. What changed:  when to take this additional instructions   sulfamethoxazole -trimethoprim  800-160 MG tablet Commonly known as: BACTRIM  DS Take  1 tablet by mouth 3 (three) times a week. What changed: when to take this   Trelegy Ellipta  200-62.5-25 MCG/ACT Aepb Generic drug: Fluticasone -Umeclidin-Vilant Inhale 1 puff into the lungs daily.               Durable Medical Equipment  (From admission, onward)           Start     Ordered   03/29/24 1513  DME Oxygen   Once       Question Answer Comment  Length of Need Lifetime   Mode or (Route) Nasal cannula   Liters per Minute 5   Frequency Continuous (stationary and portable oxygen  unit needed)   Oxygen  delivery system: Gas      03/29/24 1512            Allergies  Allergen Reactions   Acetaminophen  Shortness Of Breath   Amoxicillin  Shortness Of Breath   Ezetimibe  Other (See Comments)    Worsened the patient's COPD    Ibuprofen  Shortness Of Breath   Statins Other (See Comments)    Worsened the patient's COPD    Consultations: None  Procedures/Studies: DG Chest Port 1 View Result Date: 03/25/2024 CLINICAL DATA:  Shortness of breath. EXAM: PORTABLE CHEST 1 VIEW COMPARISON:  03/02/2024 and CT chest 11/10/2023. FINDINGS: Trachea is midline. Heart is enlarged. Enlarged pulmonary arteries. Mild bibasilar scarring. Minimal streaky density in the right lower lobe. No pleural fluid. IMPRESSION: 1. Minimal right basilar atelectasis. 2. Enlarged pulmonary arteries, indicative of pulmonary arterial hypertension. Electronically Signed   By: Newell Eke M.D.   On: 03/25/2024 18:05   DG Chest Port 1 View Result Date: 03/02/2024 CLINICAL DATA:  Shortness of breath. EXAM: PORTABLE CHEST 1 VIEW COMPARISON:  02/02/2024 FINDINGS: The lungs are clear without focal pneumonia, edema, pneumothorax or pleural effusion. Similar bibasilar atelectasis. Cardiopericardial silhouette is at upper limits of normal for size. No acute bony abnormality. Telemetry leads overlie the chest. IMPRESSION: Bibasilar atelectasis without other acute cardiopulmonary findings. Electronically Signed   By: Camellia Candle M.D.   On: 03/02/2024 11:20     Subjective: No acute issues or events overnight denies nausea vomiting diarrhea constipation headache fevers chills or chest pain   Discharge Exam: Vitals:   03/29/24 1336 03/29/24 1522  BP: 109/80   Pulse: 88   Resp: 18   Temp: 98.6 F (37 C)   SpO2: 100% 100%   Vitals:   03/29/24 0811 03/29/24 1149 03/29/24 1336 03/29/24 1522  BP:   109/80   Pulse:   88   Resp:   18   Temp:   98.6 F (37 C)   TempSrc:   Oral   SpO2: 95% 95% 100% 100%  Weight:      Height:        General: Pt is alert, awake, not in acute distress Cardiovascular: RRR, S1/S2 +, no rubs, no gallops Respiratory: CTA bilaterally, no wheezing, no rhonchi Abdominal: Soft, NT, ND, bowel sounds + Extremities: no  edema, no cyanosis    The results of significant diagnostics from this hospitalization (including imaging, microbiology, ancillary and laboratory) are listed below for reference.     Microbiology: Recent Results (from the past 240 hours)  Resp panel by RT-PCR (RSV, Flu A&B, Covid) Anterior Nasal Swab     Status: None   Collection Time: 03/25/24  7:38 PM   Specimen: Anterior Nasal Swab  Result Value Ref Range Status   SARS Coronavirus 2 by RT PCR NEGATIVE NEGATIVE Final  Comment: (NOTE) SARS-CoV-2 target nucleic acids are NOT DETECTED.  The SARS-CoV-2 RNA is generally detectable in upper respiratory specimens during the acute phase of infection. The lowest concentration of SARS-CoV-2 viral copies this assay can detect is 138 copies/mL. A negative result does not preclude SARS-Cov-2 infection and should not be used as the sole basis for treatment or other patient management decisions. A negative result may occur with  improper specimen collection/handling, submission of specimen other than nasopharyngeal swab, presence of viral mutation(s) within the areas targeted by this assay, and inadequate number of viral copies(<138 copies/mL). A negative result must be combined with clinical observations, patient history, and epidemiological information. The expected result is Negative.  Fact Sheet for Patients:  bloggercourse.com  Fact Sheet for Healthcare Providers:  seriousbroker.it  This test is no t yet approved or cleared by the [place]  FDA and  has been authorized for detection and/or diagnosis of SARS-CoV-2 by FDA under an Emergency Use Authorization (EUA). This EUA will remain  in effect (meaning this test can be used) for the duration of the COVID-19 declaration under Section 564(b)(1) of the Act, 21 U.S.C.section 360bbb-3(b)(1), unless the authorization is terminated  or revoked sooner.       Influenza A by PCR  NEGATIVE NEGATIVE Final   Influenza B by PCR NEGATIVE NEGATIVE Final    Comment: (NOTE) The Xpert Xpress SARS-CoV-2/FLU/RSV plus assay is intended as an aid in the diagnosis of influenza from Nasopharyngeal swab specimens and should not be used as a sole basis for treatment. Nasal washings and aspirates are unacceptable for Xpert Xpress SARS-CoV-2/FLU/RSV testing.  Fact Sheet for Patients: bloggercourse.com  Fact Sheet for Healthcare Providers: seriousbroker.it  This test is not yet approved or cleared by the [place]  FDA and has been authorized for detection and/or diagnosis of SARS-CoV-2 by FDA under an Emergency Use Authorization (EUA). This EUA will remain in effect (meaning this test can be used) for the duration of the COVID-19 declaration under Section 564(b)(1) of the Act, 21 U.S.C. section 360bbb-3(b)(1), unless the authorization is terminated or revoked.     Resp Syncytial Virus by PCR NEGATIVE NEGATIVE Final    Comment: (NOTE) Fact Sheet for Patients: bloggercourse.com  Fact Sheet for Healthcare Providers: seriousbroker.it  This test is not yet approved or cleared by the [place]  FDA and has been authorized for detection and/or diagnosis of SARS-CoV-2 by FDA under an Emergency Use Authorization (EUA). This EUA will remain in effect (meaning this test can be used) for the duration of the COVID-19 declaration under Section 564(b)(1) of the Act, 21 U.S.C. section 360bbb-3(b)(1), unless the authorization is terminated or revoked.  Performed at [facility], [address] [place], [place] [address]   Respiratory (~20 pathogens) panel by PCR     Status: None   Collection Time: 03/25/24  7:38 PM   Specimen: Anterior Nasal Swab; Respiratory  Result Value Ref Range Status   Adenovirus NOT DETECTED NOT DETECTED Final   Coronavirus 229E NOT  DETECTED NOT DETECTED Final    Comment: (NOTE) The Coronavirus on the Respiratory Panel, DOES NOT test for the novel  Coronavirus (2019 nCoV)    Coronavirus HKU1 NOT DETECTED NOT DETECTED Final   Coronavirus NL63 NOT DETECTED NOT DETECTED Final   Coronavirus OC43 NOT DETECTED NOT DETECTED Final   Metapneumovirus NOT DETECTED NOT DETECTED Final   Rhinovirus / Enterovirus NOT DETECTED NOT DETECTED Final   Influenza A NOT DETECTED NOT DETECTED Final   Influenza B  NOT DETECTED NOT DETECTED Final   Parainfluenza Virus 1 NOT DETECTED NOT DETECTED Final   Parainfluenza Virus 2 NOT DETECTED NOT DETECTED Final   Parainfluenza Virus 3 NOT DETECTED NOT DETECTED Final   Parainfluenza Virus 4 NOT DETECTED NOT DETECTED Final   Respiratory Syncytial Virus NOT DETECTED NOT DETECTED Final   Bordetella pertussis NOT DETECTED NOT DETECTED Final   Bordetella Parapertussis NOT DETECTED NOT DETECTED Final   Chlamydophila pneumoniae NOT DETECTED NOT DETECTED Final   Mycoplasma pneumoniae NOT DETECTED NOT DETECTED Final    Comment: Performed at [facility], [address] [place], [place] [address]     Labs: BNP (last 3 results) Recent Labs    09/21/23 1305 09/29/23 1933 10/11/23 1801  BNP 46.6 58.2 24.1   Basic Metabolic Panel: Recent Labs  Lab 03/25/24 1721 03/26/24 0447 03/28/24 1043  NA 135 138 138  K 4.1 4.9 4.2  CL 93* 97* 99  CO2 36* 33* 26  GLUCOSE 84 147* 73  BUN 9 8 11   CREATININE 0.56 0.70 0.63  CALCIUM 8.9 8.9 9.3   Liver Function Tests: No results for input(s): AST, ALT, ALKPHOS, BILITOT, PROT, ALBUMIN in the last 168 hours. No results for input(s): LIPASE, AMYLASE in the last 168 hours. No results for input(s): AMMONIA in the last 168 hours. CBC: Recent Labs  Lab 03/25/24 1721 03/26/24 0447  WBC 7.4 6.9  NEUTROABS 4.5  --   HGB 10.7* 10.0*  HCT 36.4 33.6*  MCV 89.7 87.5  PLT 284 281   Cardiac Enzymes: No results for input(s): CKTOTAL,  CKMB, CKMBINDEX, TROPONINI in the last 168 hours. BNP: Invalid input(s): POCBNP CBG: No results for input(s): GLUCAP in the last 168 hours. D-Dimer No results for input(s): DDIMER in the last 72 hours. Hgb A1c No results for input(s): HGBA1C in the last 72 hours. Lipid Profile No results for input(s): CHOL, HDL, LDLCALC, TRIG, CHOLHDL, LDLDIRECT in the last 72 hours. Thyroid  function studies No results for input(s): TSH, T4TOTAL, T3FREE, THYROIDAB in the last 72 hours.  Invalid input(s): FREET3 Anemia work up No results for input(s): VITAMINB12, FOLATE, FERRITIN, TIBC, IRON, RETICCTPCT in the last 72 hours. Urinalysis    Component Value Date/Time   COLORURINE BROWN (A) 08/10/2022 0952   APPEARANCEUR CLOUDY (A) 08/10/2022 0952   LABSPEC 1.026 08/10/2022 0952   PHURINE 5.0 08/10/2022 0952   GLUCOSEU NEGATIVE 08/10/2022 0952   HGBUR LARGE (A) 08/10/2022 0952   BILIRUBINUR NEGATIVE 08/10/2022 0952   BILIRUBINUR neg 04/24/2020 1520   KETONESUR NEGATIVE 08/10/2022 0952   PROTEINUR 100 (A) 08/10/2022 0952   UROBILINOGEN negative (A) 04/24/2020 1520   UROBILINOGEN 0.2 03/07/2015 1830   NITRITE NEGATIVE 08/10/2022 0952   LEUKOCYTESUR NEGATIVE 08/10/2022 0952   Sepsis Labs Recent Labs  Lab 03/25/24 1721 03/26/24 0447  WBC 7.4 6.9   Microbiology Recent Results (from the past 240 hours)  Resp panel by RT-PCR (RSV, Flu A&B, Covid) Anterior Nasal Swab     Status: None   Collection Time: 03/25/24  7:38 PM   Specimen: Anterior Nasal Swab  Result Value Ref Range Status   SARS Coronavirus 2 by RT PCR NEGATIVE NEGATIVE Final    Comment: (NOTE) SARS-CoV-2 target nucleic acids are NOT DETECTED.  The SARS-CoV-2 RNA is generally detectable in upper respiratory specimens during the acute phase of infection. The lowest concentration of SARS-CoV-2 viral copies this assay can detect is 138 copies/mL. A negative result does not preclude  SARS-Cov-2 infection and should  not be used as the sole basis for treatment or other patient management decisions. A negative result may occur with  improper specimen collection/handling, submission of specimen other than nasopharyngeal swab, presence of viral mutation(s) within the areas targeted by this assay, and inadequate number of viral copies(<138 copies/mL). A negative result must be combined with clinical observations, patient history, and epidemiological information. The expected result is Negative.  Fact Sheet for Patients:  bloggercourse.com  Fact Sheet for Healthcare Providers:  seriousbroker.it  This test is no t yet approved or cleared by the [place]  FDA and  has been authorized for detection and/or diagnosis of SARS-CoV-2 by FDA under an Emergency Use Authorization (EUA). This EUA will remain  in effect (meaning this test can be used) for the duration of the COVID-19 declaration under Section 564(b)(1) of the Act, 21 U.S.C.section 360bbb-3(b)(1), unless the authorization is terminated  or revoked sooner.       Influenza A by PCR NEGATIVE NEGATIVE Final   Influenza B by PCR NEGATIVE NEGATIVE Final    Comment: (NOTE) The Xpert Xpress SARS-CoV-2/FLU/RSV plus assay is intended as an aid in the diagnosis of influenza from Nasopharyngeal swab specimens and should not be used as a sole basis for treatment. Nasal washings and aspirates are unacceptable for Xpert Xpress SARS-CoV-2/FLU/RSV testing.  Fact Sheet for Patients: bloggercourse.com  Fact Sheet for Healthcare Providers: seriousbroker.it  This test is not yet approved or cleared by the [place]  FDA and has been authorized for detection and/or diagnosis of SARS-CoV-2 by FDA under an Emergency Use Authorization (EUA). This EUA will remain in effect (meaning this test can be used) for the duration of  the COVID-19 declaration under Section 564(b)(1) of the Act, 21 U.S.C. section 360bbb-3(b)(1), unless the authorization is terminated or revoked.     Resp Syncytial Virus by PCR NEGATIVE NEGATIVE Final    Comment: (NOTE) Fact Sheet for Patients: bloggercourse.com  Fact Sheet for Healthcare Providers: seriousbroker.it  This test is not yet approved or cleared by the [place]  FDA and has been authorized for detection and/or diagnosis of SARS-CoV-2 by FDA under an Emergency Use Authorization (EUA). This EUA will remain in effect (meaning this test can be used) for the duration of the COVID-19 declaration under Section 564(b)(1) of the Act, 21 U.S.C. section 360bbb-3(b)(1), unless the authorization is terminated or revoked.  Performed at [facility], [address] [place], [place] [address]   Respiratory (~20 pathogens) panel by PCR     Status: None   Collection Time: 03/25/24  7:38 PM   Specimen: Anterior Nasal Swab; Respiratory  Result Value Ref Range Status   Adenovirus NOT DETECTED NOT DETECTED Final   Coronavirus 229E NOT DETECTED NOT DETECTED Final    Comment: (NOTE) The Coronavirus on the Respiratory Panel, DOES NOT test for the novel  Coronavirus (2019 nCoV)    Coronavirus HKU1 NOT DETECTED NOT DETECTED Final   Coronavirus NL63 NOT DETECTED NOT DETECTED Final   Coronavirus OC43 NOT DETECTED NOT DETECTED Final   Metapneumovirus NOT DETECTED NOT DETECTED Final   Rhinovirus / Enterovirus NOT DETECTED NOT DETECTED Final   Influenza A NOT DETECTED NOT DETECTED Final   Influenza B NOT DETECTED NOT DETECTED Final   Parainfluenza Virus 1 NOT DETECTED NOT DETECTED Final   Parainfluenza Virus 2 NOT DETECTED NOT DETECTED Final   Parainfluenza Virus 3 NOT DETECTED NOT DETECTED Final   Parainfluenza Virus 4 NOT DETECTED NOT DETECTED Final   Respiratory Syncytial Virus NOT  DETECTED NOT DETECTED Final    Bordetella pertussis NOT DETECTED NOT DETECTED Final   Bordetella Parapertussis NOT DETECTED NOT DETECTED Final   Chlamydophila pneumoniae NOT DETECTED NOT DETECTED Final   Mycoplasma pneumoniae NOT DETECTED NOT DETECTED Final    Comment: Performed at [facility], [address] [place], [place] [address]     Time coordinating discharge: Over 30 minutes  SIGNED:   Elsie JAYSON Montclair, DO [facility] 03/29/2024, 6:12 PM Pager   If 7PM-7AM, please contact night-coverage [email or web address]

## 2024-04-01 ENCOUNTER — Other Ambulatory Visit (HOSPITAL_COMMUNITY): Payer: Self-pay

## 2024-04-03 ENCOUNTER — Other Ambulatory Visit (HOSPITAL_COMMUNITY): Payer: Self-pay

## 2024-04-05 ENCOUNTER — Other Ambulatory Visit: Payer: Self-pay

## 2024-04-08 ENCOUNTER — Inpatient Hospital Stay (HOSPITAL_COMMUNITY)
Admission: EM | Admit: 2024-04-08 | Discharge: 2024-04-09 | DRG: 191 | Disposition: A | Discharge status: Home / Self Care | Attending: Internal Medicine | Admitting: Internal Medicine

## 2024-04-08 ENCOUNTER — Other Ambulatory Visit: Payer: Self-pay

## 2024-04-08 ENCOUNTER — Encounter (HOSPITAL_COMMUNITY): Payer: Self-pay | Admitting: Internal Medicine

## 2024-04-08 ENCOUNTER — Emergency Department (HOSPITAL_COMMUNITY)

## 2024-04-08 DIAGNOSIS — J441 Chronic obstructive pulmonary disease with (acute) exacerbation: Secondary | ICD-10-CM | POA: Diagnosis present

## 2024-04-08 DIAGNOSIS — Z880 Allergy status to penicillin: Secondary | ICD-10-CM

## 2024-04-08 DIAGNOSIS — Z886 Allergy status to analgesic agent status: Secondary | ICD-10-CM

## 2024-04-08 DIAGNOSIS — Z888 Allergy status to other drugs, medicaments and biological substances status: Secondary | ICD-10-CM

## 2024-04-08 DIAGNOSIS — I2510 Atherosclerotic heart disease of native coronary artery without angina pectoris: Secondary | ICD-10-CM | POA: Diagnosis present

## 2024-04-08 DIAGNOSIS — Z8616 Personal history of COVID-19: Secondary | ICD-10-CM

## 2024-04-08 DIAGNOSIS — R7303 Prediabetes: Secondary | ICD-10-CM | POA: Diagnosis present

## 2024-04-08 DIAGNOSIS — Z825 Family history of asthma and other chronic lower respiratory diseases: Secondary | ICD-10-CM

## 2024-04-08 DIAGNOSIS — I700 Atherosclerosis of aorta: Secondary | ICD-10-CM | POA: Diagnosis present

## 2024-04-08 DIAGNOSIS — Z860100 Personal history of colon polyps, unspecified: Secondary | ICD-10-CM

## 2024-04-08 DIAGNOSIS — R1031 Right lower quadrant pain: Secondary | ICD-10-CM

## 2024-04-08 DIAGNOSIS — K219 Gastro-esophageal reflux disease without esophagitis: Secondary | ICD-10-CM | POA: Diagnosis present

## 2024-04-08 DIAGNOSIS — Z79899 Other long term (current) drug therapy: Secondary | ICD-10-CM

## 2024-04-08 DIAGNOSIS — I10 Essential (primary) hypertension: Secondary | ICD-10-CM | POA: Diagnosis present

## 2024-04-08 DIAGNOSIS — I429 Cardiomyopathy, unspecified: Secondary | ICD-10-CM | POA: Diagnosis present

## 2024-04-08 DIAGNOSIS — D509 Iron deficiency anemia, unspecified: Secondary | ICD-10-CM | POA: Diagnosis present

## 2024-04-08 DIAGNOSIS — Z823 Family history of stroke: Secondary | ICD-10-CM

## 2024-04-08 DIAGNOSIS — Z87891 Personal history of nicotine dependence: Secondary | ICD-10-CM

## 2024-04-08 DIAGNOSIS — Z7951 Long term (current) use of inhaled steroids: Secondary | ICD-10-CM

## 2024-04-08 DIAGNOSIS — Z7952 Long term (current) use of systemic steroids: Secondary | ICD-10-CM

## 2024-04-08 DIAGNOSIS — Z8249 Family history of ischemic heart disease and other diseases of the circulatory system: Secondary | ICD-10-CM

## 2024-04-08 DIAGNOSIS — J9611 Chronic respiratory failure with hypoxia: Secondary | ICD-10-CM | POA: Diagnosis present

## 2024-04-08 DIAGNOSIS — J439 Emphysema, unspecified: Secondary | ICD-10-CM | POA: Diagnosis present

## 2024-04-08 DIAGNOSIS — E785 Hyperlipidemia, unspecified: Secondary | ICD-10-CM | POA: Diagnosis present

## 2024-04-08 DIAGNOSIS — Z9981 Dependence on supplemental oxygen: Secondary | ICD-10-CM

## 2024-04-08 LAB — COMPREHENSIVE METABOLIC PANEL WITH GFR
ALT: 15 U/L (ref 0–44)
AST: 23 U/L (ref 15–41)
Albumin: 4.1 g/dL (ref 3.5–5.0)
Alkaline Phosphatase: 63 U/L (ref 38–126)
Anion gap: 8 (ref 5–15)
BUN: 12 mg/dL (ref 8–23)
CO2: 33 mmol/L — ABNORMAL HIGH (ref 22–32)
Calcium: 9.4 mg/dL (ref 8.9–10.3)
Chloride: 94 mmol/L — ABNORMAL LOW (ref 98–111)
Creatinine, Ser: 0.6 mg/dL (ref 0.44–1.00)
GFR, Estimated: >60 mL/min (ref >60)
Glucose, Bld: 99 mg/dL (ref 70–99)
Potassium: 5 mmol/L (ref 3.5–5.1)
Sodium: 136 mmol/L (ref 135–145)
Total Bilirubin: 0.3 mg/dL (ref 0.0–1.2)
Total Protein: 6.7 g/dL (ref 6.5–8.1)

## 2024-04-08 LAB — CBC WITH DIFFERENTIAL/PLATELET
Abs Immature Granulocytes: 0.04 K/uL (ref 0.00–0.07)
Basophils Absolute: 0 K/uL (ref 0.0–0.1)
Basophils Relative: 0 %
Eosinophils Absolute: 0 K/uL (ref 0.0–0.5)
Eosinophils Relative: 0 %
HCT: 34.6 % — ABNORMAL LOW (ref 36.0–46.0)
Hemoglobin: 10.4 g/dL — ABNORMAL LOW (ref 12.0–15.0)
Immature Granulocytes: 1 %
Lymphocytes Relative: 7 %
Lymphs Abs: 0.5 K/uL — ABNORMAL LOW (ref 0.7–4.0)
MCH: 26.7 pg (ref 26.0–34.0)
MCHC: 30.1 g/dL (ref 30.0–36.0)
MCV: 88.9 fL (ref 80.0–100.0)
Monocytes Absolute: 0.3 K/uL (ref 0.1–1.0)
Monocytes Relative: 4 %
Neutro Abs: 7.1 K/uL (ref 1.7–7.7)
Neutrophils Relative %: 88 %
Platelets: 251 K/uL (ref 150–400)
RBC: 3.89 MIL/uL (ref 3.87–5.11)
RDW: 16 % — ABNORMAL HIGH (ref 11.5–15.5)
WBC: 8 K/uL (ref 4.0–10.5)
nRBC: 0 % (ref 0.0–0.2)

## 2024-04-08 LAB — URINALYSIS, W/ REFLEX TO CULTURE (INFECTION SUSPECTED)
Bilirubin Urine: NEGATIVE
Glucose, UA: NEGATIVE mg/dL
Ketones, ur: NEGATIVE mg/dL
Leukocytes,Ua: NEGATIVE
Nitrite: NEGATIVE
Protein, ur: NEGATIVE mg/dL
Specific Gravity, Urine: 1.006 (ref 1.005–1.030)
pH: 7 (ref 5.0–8.0)

## 2024-04-08 LAB — TROPONIN T, HIGH SENSITIVITY
Troponin T High Sensitivity: <15 ng/L (ref 0–19)
Troponin T High Sensitivity: <15 ng/L (ref 0–19)

## 2024-04-08 LAB — PRO BRAIN NATRIURETIC PEPTIDE: Pro Brain Natriuretic Peptide: <50 pg/mL (ref <300.0)

## 2024-04-08 LAB — LIPASE, BLOOD: Lipase: 36 U/L (ref 11–51)

## 2024-04-08 MED ORDER — ARFORMOTEROL TARTRATE 15 MCG/2ML IN NEBU
15.0000 ug | INHALATION_SOLUTION | Freq: Two times a day (BID) | RESPIRATORY_TRACT | Status: DC
  Administered 2024-04-08 – 2024-04-09 (×2): 15 ug via RESPIRATORY_TRACT
  Filled 2024-04-08 (×2): qty 2

## 2024-04-08 MED ORDER — ALBUTEROL SULFATE (2.5 MG/3ML) 0.083% IN NEBU: 2.5000 mg | INHALATION_SOLUTION | RESPIRATORY_TRACT | Status: DC | PRN

## 2024-04-08 MED ORDER — AZITHROMYCIN 250 MG PO TABS
500.0000 mg | ORAL_TABLET | Freq: Every day | ORAL | Status: DC
  Administered 2024-04-09: 500 mg via ORAL
  Filled 2024-04-08: qty 2

## 2024-04-08 MED ORDER — ENOXAPARIN SODIUM 40 MG/0.4ML IJ SOSY
40.0000 mg | PREFILLED_SYRINGE | INTRAMUSCULAR | Status: DC
  Administered 2024-04-09: 40 mg via SUBCUTANEOUS
  Filled 2024-04-08: qty 0.4

## 2024-04-08 MED ORDER — IPRATROPIUM-ALBUTEROL 0.5-2.5 (3) MG/3ML IN SOLN
3.0000 mL | RESPIRATORY_TRACT | Status: AC
  Administered 2024-04-08 (×3): 3 mL via RESPIRATORY_TRACT
  Filled 2024-04-08 (×3): qty 3

## 2024-04-08 MED ORDER — SULFAMETHOXAZOLE-TRIMETHOPRIM 800-160 MG PO TABS: 1.0000 | ORAL_TABLET | ORAL | Status: DC

## 2024-04-08 MED ORDER — METHYLPREDNISOLONE SODIUM SUCC 125 MG IJ SOLR
125.0000 mg | Freq: Once | INTRAMUSCULAR | Status: AC
  Administered 2024-04-08: 125 mg via INTRAVENOUS
  Filled 2024-04-08: qty 2

## 2024-04-08 MED ORDER — IPRATROPIUM-ALBUTEROL 0.5-2.5 (3) MG/3ML IN SOLN
3.0000 mL | Freq: Four times a day (QID) | RESPIRATORY_TRACT | Status: DC
  Administered 2024-04-08 – 2024-04-09 (×2): 3 mL via RESPIRATORY_TRACT
  Filled 2024-04-08 (×2): qty 3

## 2024-04-08 MED ORDER — IOHEXOL 350 MG/ML SOLN
100.0000 mL | Freq: Once | INTRAVENOUS | Status: AC | PRN
  Administered 2024-04-08: 100 mL via INTRAVENOUS

## 2024-04-08 MED ORDER — ONDANSETRON HCL 4 MG PO TABS: 4.0000 mg | ORAL_TABLET | Freq: Four times a day (QID) | ORAL | Status: DC | PRN

## 2024-04-08 MED ORDER — PANTOPRAZOLE SODIUM 40 MG PO TBEC
40.0000 mg | DELAYED_RELEASE_TABLET | Freq: Every day | ORAL | Status: DC
  Administered 2024-04-09: 40 mg via ORAL
  Filled 2024-04-08: qty 1

## 2024-04-08 MED ORDER — FENTANYL CITRATE (PF) 50 MCG/ML IJ SOSY
50.0000 ug | PREFILLED_SYRINGE | Freq: Once | INTRAMUSCULAR | Status: AC
  Administered 2024-04-08: 50 ug via INTRAVENOUS
  Filled 2024-04-08: qty 1

## 2024-04-08 MED ORDER — PREDNISONE 50 MG PO TABS
50.0000 mg | ORAL_TABLET | Freq: Every day | ORAL | Status: DC
  Administered 2024-04-09: 50 mg via ORAL
  Filled 2024-04-08: qty 1

## 2024-04-08 MED ORDER — BUDESONIDE 0.5 MG/2ML IN SUSP
0.5000 mg | Freq: Two times a day (BID) | RESPIRATORY_TRACT | Status: DC
  Administered 2024-04-09: 0.5 mg via RESPIRATORY_TRACT
  Filled 2024-04-08 (×2): qty 2

## 2024-04-08 MED ORDER — MAGNESIUM SULFATE 2 GM/50ML IV SOLN
2.0000 g | Freq: Once | INTRAVENOUS | Status: AC
  Administered 2024-04-08: 2 g via INTRAVENOUS
  Filled 2024-04-08: qty 50

## 2024-04-08 MED ORDER — ALBUTEROL SULFATE (2.5 MG/3ML) 0.083% IN NEBU
10.0000 mg/h | INHALATION_SOLUTION | Freq: Once | RESPIRATORY_TRACT | Status: AC
  Administered 2024-04-08: 10 mg/h via RESPIRATORY_TRACT
  Filled 2024-04-08: qty 12

## 2024-04-08 MED ORDER — ONDANSETRON HCL 4 MG/2ML IJ SOLN: 4.0000 mg | Freq: Four times a day (QID) | INTRAMUSCULAR | Status: DC | PRN
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
## 2024-04-08 NOTE — ED Triage Notes (Signed)
 Pt complains of knot in abdomen in LLQ says the pain is causing her SOB. SOB starting within last hour. 5L [place] at baseline. Hx. COPD
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
## 2024-04-09 ENCOUNTER — Other Ambulatory Visit (HOSPITAL_COMMUNITY): Payer: Self-pay

## 2024-04-09 ENCOUNTER — Other Ambulatory Visit: Payer: Self-pay

## 2024-04-09 LAB — PROTIME-INR
INR: 1 (ref 0.8–1.2)
Prothrombin Time: 13.3 s (ref 11.4–15.2)

## 2024-04-09 LAB — CBC
HCT: 33.2 % — ABNORMAL LOW (ref 36.0–46.0)
Hemoglobin: 9.8 g/dL — ABNORMAL LOW (ref 12.0–15.0)
MCH: 26.3 pg (ref 26.0–34.0)
MCHC: 29.5 g/dL — ABNORMAL LOW (ref 30.0–36.0)
MCV: 89.2 fL (ref 80.0–100.0)
Platelets: 240 K/uL (ref 150–400)
RBC: 3.72 MIL/uL — ABNORMAL LOW (ref 3.87–5.11)
RDW: 16 % — ABNORMAL HIGH (ref 11.5–15.5)
WBC: 10.6 K/uL — ABNORMAL HIGH (ref 4.0–10.5)
nRBC: 0 % (ref 0.0–0.2)

## 2024-04-09 LAB — BASIC METABOLIC PANEL WITH GFR
Anion gap: 8 (ref 5–15)
BUN: 9 mg/dL (ref 8–23)
CO2: 31 mmol/L (ref 22–32)
Calcium: 9.3 mg/dL (ref 8.9–10.3)
Chloride: 99 mmol/L (ref 98–111)
Creatinine, Ser: 0.51 mg/dL (ref 0.44–1.00)
GFR, Estimated: >60 mL/min (ref >60)
Glucose, Bld: 121 mg/dL — ABNORMAL HIGH (ref 70–99)
Potassium: 4.5 mmol/L (ref 3.5–5.1)
Sodium: 138 mmol/L (ref 135–145)

## 2024-04-09 MED ORDER — AZITHROMYCIN 500 MG PO TABS
500.0000 mg | ORAL_TABLET | Freq: Every day | ORAL | 0 refills | Status: AC
  Filled 2024-04-09: qty 4, 4d supply, fill #0

## 2024-04-09 MED ORDER — IPRATROPIUM-ALBUTEROL 0.5-2.5 (3) MG/3ML IN SOLN
3.0000 mL | Freq: Four times a day (QID) | RESPIRATORY_TRACT | Status: DC
  Administered 2024-04-09: 3 mL via RESPIRATORY_TRACT
  Filled 2024-04-09: qty 3

## 2024-04-09 MED ORDER — GUAIFENESIN ER 600 MG PO TB12
600.0000 mg | ORAL_TABLET | Freq: Two times a day (BID) | ORAL | 0 refills | Status: AC
  Filled 2024-04-09: qty 14, 7d supply, fill #0

## 2024-04-09 MED ORDER — GUAIFENESIN ER 600 MG PO TB12
600.0000 mg | ORAL_TABLET | Freq: Two times a day (BID) | ORAL | Status: DC
  Filled 2024-04-09: qty 1

## 2024-04-09 MED ORDER — PREDNISONE 10 MG PO TABS
ORAL_TABLET | ORAL | 0 refills | Status: DC
  Filled 2024-04-09: qty 30, 12d supply, fill #0

## 2024-04-09 MED ORDER — ALBUTEROL SULFATE HFA 108 (90 BASE) MCG/ACT IN AERS
2.0000 | INHALATION_SPRAY | Freq: Four times a day (QID) | RESPIRATORY_TRACT | 2 refills | Status: DC | PRN
  Filled 2024-04-09 (×2): qty 6.7, 30d supply, fill #0

## 2024-04-09 NOTE — Discharge Summary (Signed)
 Physician Discharge Summary  [name] FMW:[id number] DOB: 1960/12/15 DOA: 04/08/2024  PCP: Rosalea Rosina [name], PA  Admit date: 04/08/2024 Discharge date: 04/09/2024  Admitted From: Home Disposition: Home  Recommendations for Outpatient Follow-up:  Follow up with PCP in 1-2 weeks Follow-up with pulmonology, Dr. Theophilus as needed Continue azithromycin  to complete 5-day course and prednisone  taper on discharge for COPD exacerbation Albuterol  MDI refilled Continue to encourage daily use of her Trelegy inhaler; likely contributing factor to her recurrent hospitalizations  Home Health: No Equipment/Devices: On 5 L nasal cannula which is baseline  Discharge Condition: Stable CODE STATUS: Full code Diet recommendation: Regular diet  History of present illness:  [name] is a 63 year old female with past medical history significant for HTN, HLD, IDA, COPD/chronic respiratory failure on 5 L of oxygen , GERD who presented to [facility] ED on 04/08/2024 with worsening shortness of breath.  Reports progressively worsening wheezing, productive cough.  Numerous recent hospitalizations for similar complaints, last 2 weeks ago.  Reports compliance with her home medications, although looks like she does not take her Trelegy inhaler daily and utilizes it as needed.  In the ED, temperature 97.0 F, HR 106, RR 28, BP 163/106, SpO2 100% on 6 L nasal cannula.  WBC 8.0, hemoglobin 10.4, platelet count 251.  Sodium 136, potassium 5.0, chloride 94, CO2 33, glucose 99, BUN 12, creatinine 0.60.  Lipase 36.  AST 23, ALT 15, total bili 0.3.  BNP less than 50.0.  High-sensitivity troponin less than 15 x 2.  Urinalysis unrevealing.  CT angiogram chest negative for pulm realism, noted airway thickening with mucous plugging and right basilar atelectasis, CAD/aortic atherosclerosis.  CT abdomen/pelvis with contrast with no acute findings in the abdomen/pelvis, uncomplicated colonic diverticulosis  without evidence of acute diverticulitis.  Patient was started on nebulizer and TRH consulted for admission for further evaluation and management of COPD exacerbation.  Hospital course:  COPD exacerbation Chronic hypoxic respiratory failure, on 5L [place] at baseline Patient presenting to ED with progressive shortness of breath, productive cough and wheezing.  Similar to previous episodes in the past from COPD exacerbation.  Patient on 5 L nasal cannula at baseline.  Follows with pulmonology, Dr. Theophilus outpatient.  Is prescribed Trelegy inhaler that she uses on a as needed basis and not daily as prescribed, suspect this is a contributing factor.  Patient was started on scheduled neb treatments, IV steroids and azithromycin  with improvement of symptoms and feels that she is back at her baseline.  Patient wishes to discharge home.  Patient oxygenating well on 5 L nasal cannula.  Discussed utilizing Trelegy inhaler on a daily basis.  Will continue azithromycin  to complete 5-day course, prednisone  taper, Mucinex  and patient requesting refill of her albuterol  MDI.  Continue Nucala  injection every 28 days.  Continue home budesonide  nebs, DuoNeb/albuterol  neb as needed.  Outpatient follow-up with PCP/pulmonology.  HTN Currently not on antihypertensives outpatient.  BP 126/79.  HLD Continue Repatha   Iron deficiency anemia Continue ferrous sulfate   GERD Continue omeprazole  20 mg p.o. daily  Discharge Diagnoses:  Principal Problem:   COPD with acute exacerbation [facility])    Discharge Instructions  Discharge Instructions     Call MD for:  difficulty breathing, headache or visual disturbances   Complete by: As directed    Call MD for:  extreme fatigue   Complete by: As directed    Call MD for:  persistant dizziness or light-headedness   Complete by: As directed    Call MD for:  persistant nausea and vomiting   Complete by: As directed    Call MD for:  severe uncontrolled pain   Complete by: As  directed    Call MD for:  temperature >100.4   Complete by: As directed    Diet - low sodium heart healthy   Complete by: As directed    Increase activity slowly   Complete by: As directed       Allergies as of 04/09/2024       Reactions   Acetaminophen  Shortness Of Breath   Amoxicillin  Shortness Of Breath   Ezetimibe  Other (See Comments)   Worsened the patient's COPD   Ibuprofen  Shortness Of Breath   Statins Other (See Comments)   Worsened the patient's COPD        Medication List     TAKE these medications    albuterol  (2.5 MG/3ML) 0.083% nebulizer solution Commonly known as: PROVENTIL  INHALE 3 ML BY NEBULIZATION EVERY 6 HOURS AS NEEDED FOR WHEEZING OR SHORTNESS OF BREATH What changed: See the new instructions.   albuterol  108 (90 Base) MCG/ACT inhaler Commonly known as: VENTOLIN  HFA Inhale 2 puffs into the lungs every 6 (six) hours as needed for wheezing or shortness of breath. What changed: Another medication with the same name was changed. Make sure you understand how and when to take each.   azithromycin  500 MG tablet Commonly known as: ZITHROMAX  Take 1 tablet (500 mg total) by mouth daily for 4 days. Start taking on: April 10, 2024   budesonide  0.5 MG/2ML nebulizer solution Commonly known as: PULMICORT  Take 2 mLs (0.5 mg total) by nebulization 2 (two) times daily.   ferrous sulfate  325 (65 FE) MG tablet Take 1 tablet (325 mg total) by mouth daily with breakfast.   guaiFENesin  600 MG 12 hr tablet Commonly known as: MUCINEX  Take 1 tablet (600 mg total) by mouth 2 (two) times daily for 7 days.   ipratropium-albuterol  0.5-2.5 (3) MG/3ML Soln Commonly known as: DUONEB Take 3 mLs by nebulization every 6 (six) hours. What changed:  when to take this reasons to take this   nitroGLYCERIN  0.4 MG SL tablet Commonly known as: NITROSTAT  Place 1 tablet (0.4 mg total) under the tongue every 5 (five) minutes as needed. What changed: reasons to take this    Nucala  100 MG/ML Soaj Generic drug: Mepolizumab  Inject 1 mL (100 mg total) into the skin every 28 (twenty-eight) days.   omeprazole  20 MG capsule Commonly known as: PRILOSEC TAKE 1 CAPSULE BY MOUTH EVERY DAY What changed:  how much to take when to take this   OXYGEN  Inhale 5 L/min into the lungs daily as needed (for shortness of breath).   polyethylene glycol 17 g packet Commonly known as: MiraLax  Take 17 g by mouth daily. What changed:  when to take this reasons to take this   predniSONE  10 MG tablet Commonly known as: DELTASONE  Take 4 tablets (40 mg total) by mouth daily for 3 days, THEN 3 tablets (30 mg total) daily for 3 days, THEN 2 tablets (20 mg total) daily for 3 days, THEN 1 tablet (10 mg total) daily for 3 days. Start taking on: April 10, 2024 What changed: See the new instructions.   Repatha  SureClick 140 MG/ML Soaj Generic drug: Evolocumab  Inject 140 mg into the skin every 14 (fourteen) days. What changed:  when to take this additional instructions   sulfamethoxazole -trimethoprim  800-160 MG tablet Commonly known as: BACTRIM  DS Take 1 tablet by mouth 3 (three) times a week.  Trelegy Ellipta  200-62.5-25 MCG/ACT Aepb Generic drug: Fluticasone -Umeclidin-Vilant Inhale 1 puff into the lungs daily. What changed:  when to take this reasons to take this        Follow-up Information     Rosalea Rosina [name], [place]. Schedule an appointment as soon as possible for a visit in 1 week(s).   Specialty: Physician Assistant Contact information: [address] [place] [address] [phone]         Mannam, Praveen, MD Follow up.   Specialty: Pulmonary Disease Why: As needed Contact information: [address] [place] [address] [phone]                Allergies  Allergen Reactions   Acetaminophen  Shortness Of Breath   Amoxicillin  Shortness Of Breath   Ezetimibe  Other (See Comments)    Worsened the patient's COPD    Ibuprofen  Shortness Of Breath   Statins Other (See Comments)    Worsened the patient's COPD    Consultations: None   Procedures/Studies: CT ABDOMEN PELVIS W CONTRAST Result Date: 04/08/2024 EXAM: CT ABDOMEN AND PELVIS WITH CONTRAST 04/08/2024 07:10:39 PM TECHNIQUE: CT of the abdomen and pelvis was performed with the administration of 100 mL iohexol  (OMNIPAQUE ) 350 MG/ML injection. Multiplanar reformatted images are provided for review. Automated exposure control, iterative reconstruction, and/or weight-based adjustment of the mA/kV was utilized to reduce the radiation dose to as low as reasonably achievable. COMPARISON: 05/20/2022 CLINICAL HISTORY: RLQ abdominal pain; pain and tenderness RLQ. FINDINGS: LOWER CHEST: See chest CT report today. LIVER: The liver is unremarkable. GALLBLADDER AND BILE DUCTS: Gallbladder is unremarkable. No biliary ductal dilatation. SPLEEN: No acute abnormality. PANCREAS: No acute abnormality. ADRENAL GLANDS: No acute abnormality. KIDNEYS, URETERS AND BLADDER: 11 mm cyst in the upper pole of the right kidney appears simple. Per consensus, no follow-up is needed for simple Bosniak type 1 and 2 renal cysts, unless the patient has a malignancy history or risk factors. No stones in the kidneys or ureters. No hydronephrosis. No perinephric or periureteral stranding. Urinary bladder is unremarkable. GI AND BOWEL: Stomach demonstrates no acute abnormality. Colonic diverticulosis, most pronounced in the left colon. There is no bowel obstruction. PERITONEUM AND RETROPERITONEUM: No ascites. No free air. VASCULATURE: Aorta is normal in caliber. Aortic atherosclerosis. LYMPH NODES: No lymphadenopathy. REPRODUCTIVE ORGANS: Pessary noted in the vagina. 4.4 cm fibroid within the uterus centrally. No adnexal mass. BONES AND SOFT TISSUES: No acute osseous abnormality. No focal soft tissue abnormality. IMPRESSION: 1. No acute findings in the abdomen or pelvis. 2. Uncomplicated colonic  diverticulosis without evidence of acute diverticulitis. 3. Aortic atherosclerosis. Electronically signed by: Franky Crease MD 04/08/2024 07:55 PM EST RP Workstation: [id number]   CT Angio Chest PE W and/or Wo Contrast Result Date: 04/08/2024 EXAM: CTA of the Chest with contrast for PE 04/08/2024 07:10:39 PM TECHNIQUE: CTA of the chest was performed without and with the administration of 100 mL of iohexol  (OMNIPAQUE ) 350 MG/ML injection. Multiplanar reformatted images are provided for review. MIP images are provided for review. Automated exposure control, iterative reconstruction, and/or weight based adjustment of the mA/kV was utilized to reduce the radiation dose to as low as reasonably achievable. COMPARISON: 11/10/2023 CLINICAL HISTORY: Pulmonary embolism (PE) suspected, high prob. FINDINGS: PULMONARY ARTERIES: Pulmonary arteries are adequately opacified for evaluation. No pulmonary embolism. Main pulmonary artery is normal in caliber. MEDIASTINUM: The heart and pericardium demonstrate no acute abnormality. Scattered coronary artery and aortic atherosclerosis. There is no acute abnormality of the thoracic aorta. LYMPH NODES:  No mediastinal, hilar or axillary lymphadenopathy. LUNGS AND PLEURA: Airway thickening in the lower lobes and right middle lobe with mucous plugging and associated right basilar opacities, favor atelectasis. No pleural effusion or pneumothorax. UPPER ABDOMEN: Limited images of the upper abdomen are unremarkable. SOFT TISSUES AND BONES: No acute bone or soft tissue abnormality. IMPRESSION: 1. No pulmonary embolism. 2. Airway thickening with mucous plugging and right basilar atelectasis. 3. Coronary artery disease, aortic atherosclerosis. Electronically signed by: Franky Crease MD 04/08/2024 07:50 PM EST RP Workstation: [id number]   DG Chest Port 1 View Result Date: 03/25/2024 CLINICAL DATA:  Shortness of breath. EXAM: PORTABLE CHEST 1 VIEW COMPARISON:  03/02/2024 and CT chest 11/10/2023.  FINDINGS: Trachea is midline. Heart is enlarged. Enlarged pulmonary arteries. Mild bibasilar scarring. Minimal streaky density in the right lower lobe. No pleural fluid. IMPRESSION: 1. Minimal right basilar atelectasis. 2. Enlarged pulmonary arteries, indicative of pulmonary arterial hypertension. Electronically Signed   By: Newell Eke M.D.   On: 03/25/2024 18:05     Subjective: Patient seen examined bedside, lying in bed.  Undergoing nebulizer treatment.  RT present at bedside.  Patient with no complaints.  Remains on 5 L nasal cannula with SpO2 100% at rest.  Patient inquiring on discharge home as she feels like she is at her baseline.  No other questions or concerns at this time.  Discussed with patient needs to comply with Trelegy Ellipta  inhaler prescription to take once a day not as needed is likely contributing factor to her recurrent hospitalizations.  No other questions or concerns at this time.  Denies headache, no visual changes, no chest pain, no palpitations, no shortness of breath more than her typical baseline, no fever/chills/night sweats, no nausea/vomiting/diarrhea, no abdominal pain, no focal weakness, no fatigue, no paresthesia.  No acute events overnight per nurse staff.  Discharge Exam: Vitals:   04/09/24 0802 04/09/24 0803  BP:    Pulse:    Resp:    Temp:    SpO2: 99% 100%   Vitals:   04/09/24 0304 04/09/24 0659 04/09/24 0802 04/09/24 0803  BP: 126/79 125/84    Pulse: 78 78    Resp: (!) 24 (!) 22    Temp: (!) 97.5 F (36.4 C) (!) 97.5 F (36.4 C)    TempSrc:      SpO2: 100% 100% 99% 100%  Weight:      Height:        Physical Exam: GEN: NAD, alert and oriented x 3, chronically ill in appearance HEENT: NCAT, PERRL, EOMI, sclera clear, MMM PULM: Sounds slight diminished bilateral bases, no wheezes/crackles, normal respiratory effort without accessory muscle use, on 5 L nasal cannula with SpO2 100% at rest CV: RRR w/o M/G/R GI: abd soft, NTND, + BS MSK: no  peripheral edema, moves all remedies independently NEURO: No focal neurological deficit PSYCH: normal mood/affect Integumentary: dry/intact, no rashes or wounds    The results of significant diagnostics from this hospitalization (including imaging, microbiology, ancillary and laboratory) are listed below for reference.     Microbiology: No results found for this or any previous visit (from the past 240 hours).   Labs: BNP (last 3 results) Recent Labs    09/21/23 1305 09/29/23 1933 10/11/23 1801  BNP 46.6 58.2 24.1   Basic Metabolic Panel: Recent Labs  Lab 04/08/24 1805 04/09/24 0529  NA 136 138  K 5.0 4.5  CL 94* 99  CO2 33* 31  GLUCOSE 99 121*  BUN 12 9  CREATININE 0.60 0.51  CALCIUM 9.4 9.3   Liver Function Tests: Recent Labs  Lab 04/08/24 1805  AST 23  ALT 15  ALKPHOS 63  BILITOT 0.3  PROT 6.7  ALBUMIN 4.1   Recent Labs  Lab 04/08/24 1805  LIPASE 36   No results for input(s): AMMONIA in the last 168 hours. CBC: Recent Labs  Lab 04/08/24 1805 04/09/24 0529  WBC 8.0 10.6*  NEUTROABS 7.1  --   HGB 10.4* 9.8*  HCT 34.6* 33.2*  MCV 88.9 89.2  PLT 251 240   Cardiac Enzymes: No results for input(s): CKTOTAL, CKMB, CKMBINDEX, TROPONINI in the last 168 hours. BNP: Invalid input(s): POCBNP CBG: No results for input(s): GLUCAP in the last 168 hours. D-Dimer No results for input(s): DDIMER in the last 72 hours. Hgb A1c No results for input(s): HGBA1C in the last 72 hours. Lipid Profile No results for input(s): CHOL, HDL, LDLCALC, TRIG, CHOLHDL, LDLDIRECT in the last 72 hours. Thyroid  function studies No results for input(s): TSH, T4TOTAL, T3FREE, THYROIDAB in the last 72 hours.  Invalid input(s): FREET3 Anemia work up No results for input(s): VITAMINB12, FOLATE, FERRITIN, TIBC, IRON, RETICCTPCT in the last 72 hours. Urinalysis    Component Value Date/Time   COLORURINE STRAW (A) 04/08/2024  2042   APPEARANCEUR CLEAR 04/08/2024 2042   LABSPEC 1.006 04/08/2024 2042   PHURINE 7.0 04/08/2024 2042   GLUCOSEU NEGATIVE 04/08/2024 2042   HGBUR SMALL (A) 04/08/2024 2042   BILIRUBINUR NEGATIVE 04/08/2024 2042   BILIRUBINUR neg 04/24/2020 1520   KETONESUR NEGATIVE 04/08/2024 2042   PROTEINUR NEGATIVE 04/08/2024 2042   UROBILINOGEN negative (A) 04/24/2020 1520   UROBILINOGEN 0.2 03/07/2015 1830   NITRITE NEGATIVE 04/08/2024 2042   LEUKOCYTESUR NEGATIVE 04/08/2024 2042   Sepsis Labs Recent Labs  Lab 04/08/24 1805 04/09/24 0529  WBC 8.0 10.6*   Microbiology No results found for this or any previous visit (from the past 240 hours).   Time coordinating discharge: Over 30 minutes  SIGNED:   Camellia PARAS Geniva Lohnes, DO  [facility] 04/09/2024, 9:51 AM

## 2024-04-09 NOTE — Progress Notes (Signed)
 Discharge meds in a secure bag delivered to patient in room by this RN- albuterol  not filled because it was too soon, patient explained she had lost med - pharmacy able to do a lost med refill, will deliver to room

## 2024-04-09 NOTE — Plan of Care (Signed)

## 2024-04-09 NOTE — Progress Notes (Signed)
   04/09/24 0954  TOC Brief Assessment  Insurance and Status Reviewed  Patient has primary care physician Yes  Home environment has been reviewed Apartment  Prior level of function: Modified Independent  Prior/Current Home Services Current home services (Home O2)  Social Drivers of Health Review SDOH reviewed no interventions necessary  Readmission risk has been reviewed Yes  Transition of care needs no transition of care needs at this time    Pt has multiple readmissions. Pt resides in an apartment. Pt currently on home O2, 5L at baseline. O2 provided through [employer]. No further TOC needs at this time.  Signed: Heather Saltness, MSW, LCSW Clinical Social Worker Inpatient Care Management 04/09/2024 9:56 AM

## 2024-04-13 ENCOUNTER — Other Ambulatory Visit: Payer: Self-pay | Admitting: Pulmonary Disease

## 2024-04-16 ENCOUNTER — Ambulatory Visit (HOSPITAL_BASED_OUTPATIENT_CLINIC_OR_DEPARTMENT_OTHER): Attending: Pulmonary Disease | Admitting: Pulmonary Disease

## 2024-04-16 VITALS — Ht 62.0 in | Wt 160.0 lb

## 2024-04-16 DIAGNOSIS — J449 Chronic obstructive pulmonary disease, unspecified: Secondary | ICD-10-CM

## 2024-04-16 DIAGNOSIS — J441 Chronic obstructive pulmonary disease with (acute) exacerbation: Secondary | ICD-10-CM

## 2024-04-20 ENCOUNTER — Emergency Department (HOSPITAL_COMMUNITY)

## 2024-04-20 ENCOUNTER — Encounter (HOSPITAL_COMMUNITY): Payer: Self-pay

## 2024-04-20 ENCOUNTER — Other Ambulatory Visit: Payer: Self-pay

## 2024-04-20 ENCOUNTER — Inpatient Hospital Stay (HOSPITAL_COMMUNITY)
Admission: EM | Admit: 2024-04-20 | Discharge: 2024-04-23 | DRG: 189 | Disposition: A | Attending: Internal Medicine | Admitting: Internal Medicine

## 2024-04-20 DIAGNOSIS — I10 Essential (primary) hypertension: Secondary | ICD-10-CM | POA: Diagnosis present

## 2024-04-20 DIAGNOSIS — J9621 Acute and chronic respiratory failure with hypoxia: Secondary | ICD-10-CM

## 2024-04-20 DIAGNOSIS — R0689 Other abnormalities of breathing: Secondary | ICD-10-CM

## 2024-04-20 DIAGNOSIS — E785 Hyperlipidemia, unspecified: Secondary | ICD-10-CM | POA: Diagnosis present

## 2024-04-20 DIAGNOSIS — J441 Chronic obstructive pulmonary disease with (acute) exacerbation: Principal | ICD-10-CM | POA: Diagnosis present

## 2024-04-20 DIAGNOSIS — J9611 Chronic respiratory failure with hypoxia: Secondary | ICD-10-CM | POA: Diagnosis present

## 2024-04-20 DIAGNOSIS — K219 Gastro-esophageal reflux disease without esophagitis: Secondary | ICD-10-CM | POA: Diagnosis present

## 2024-04-20 DIAGNOSIS — E871 Hypo-osmolality and hyponatremia: Secondary | ICD-10-CM | POA: Diagnosis present

## 2024-04-20 LAB — CBC WITH DIFFERENTIAL/PLATELET
Abs Immature Granulocytes: 0.03 K/uL (ref 0.00–0.07)
Basophils Absolute: 0 K/uL (ref 0.0–0.1)
Basophils Relative: 1 %
Eosinophils Absolute: 0 K/uL (ref 0.0–0.5)
Eosinophils Relative: 0 %
HCT: 32.4 % — ABNORMAL LOW (ref 36.0–46.0)
Hemoglobin: 9.9 g/dL — ABNORMAL LOW (ref 12.0–15.0)
Immature Granulocytes: 0 %
Lymphocytes Relative: 26 %
Lymphs Abs: 2.2 K/uL (ref 0.7–4.0)
MCH: 26.4 pg (ref 26.0–34.0)
MCHC: 30.6 g/dL (ref 30.0–36.0)
MCV: 86.4 fL (ref 80.0–100.0)
Monocytes Absolute: 0.9 K/uL (ref 0.1–1.0)
Monocytes Relative: 11 %
Neutro Abs: 5.4 K/uL (ref 1.7–7.7)
Neutrophils Relative %: 62 %
Platelets: 243 K/uL (ref 150–400)
RBC: 3.75 MIL/uL — ABNORMAL LOW (ref 3.87–5.11)
RDW: 15.4 % (ref 11.5–15.5)
WBC: 8.6 K/uL (ref 4.0–10.5)
nRBC: 0 % (ref 0.0–0.2)

## 2024-04-20 LAB — I-STAT VENOUS BLOOD GAS, ED
Acid-Base Excess: 6 mmol/L — ABNORMAL HIGH (ref 0.0–2.0)
Bicarbonate: 31.3 mmol/L — ABNORMAL HIGH (ref 20.0–28.0)
Calcium, Ion: 1.07 mmol/L — ABNORMAL LOW (ref 1.15–1.40)
HCT: 32 % — ABNORMAL LOW (ref 36.0–46.0)
Hemoglobin: 10.9 g/dL — ABNORMAL LOW (ref 12.0–15.0)
O2 Saturation: 80 %
Potassium: 3.8 mmol/L (ref 3.5–5.1)
Sodium: 128 mmol/L — ABNORMAL LOW (ref 135–145)
TCO2: 33 mmol/L — ABNORMAL HIGH (ref 22–32)
pCO2, Ven: 50.5 mmHg (ref 44–60)
pH, Ven: 7.401 (ref 7.25–7.43)
pO2, Ven: 45 mmHg (ref 32–45)

## 2024-04-20 LAB — URINALYSIS, ROUTINE W REFLEX MICROSCOPIC
Bilirubin Urine: NEGATIVE
Glucose, UA: NEGATIVE mg/dL
Hgb urine dipstick: NEGATIVE
Ketones, ur: NEGATIVE mg/dL
Leukocytes,Ua: NEGATIVE
Nitrite: NEGATIVE
Protein, ur: NEGATIVE mg/dL
Specific Gravity, Urine: 1.001 — ABNORMAL LOW (ref 1.005–1.030)
pH: 6 (ref 5.0–8.0)

## 2024-04-20 MED ORDER — ALBUTEROL SULFATE (2.5 MG/3ML) 0.083% IN NEBU
2.5000 mg | INHALATION_SOLUTION | RESPIRATORY_TRACT | Status: DC
  Administered 2024-04-20 (×2): 2.5 mg via RESPIRATORY_TRACT

## 2024-04-20 MED ORDER — ALBUTEROL SULFATE (2.5 MG/3ML) 0.083% IN NEBU
INHALATION_SOLUTION | RESPIRATORY_TRACT | Status: AC
  Administered 2024-04-20: 2.5 mg
  Filled 2024-04-20: qty 9

## 2024-04-20 MED ORDER — SODIUM CHLORIDE 0.9 % IV SOLN
500.0000 mg | Freq: Once | INTRAVENOUS | Status: AC
  Administered 2024-04-20: 500 mg via INTRAVENOUS
  Filled 2024-04-20: qty 5

## 2024-04-20 MED ORDER — MAGNESIUM SULFATE 2 GM/50ML IV SOLN: 2.0000 g | Freq: Once | INTRAVENOUS | Status: AC

## 2024-04-20 MED ORDER — SODIUM CHLORIDE 0.9 % IV SOLN
2.0000 g | Freq: Once | INTRAVENOUS | Status: AC
  Administered 2024-04-20: 2 g via INTRAVENOUS
  Filled 2024-04-20: qty 20

## 2024-04-20 MED ORDER — FENTANYL CITRATE (PF) 50 MCG/ML IJ SOSY
50.0000 ug | PREFILLED_SYRINGE | Freq: Once | INTRAMUSCULAR | Status: AC
  Administered 2024-04-21: 50 ug via INTRAVENOUS
  Filled 2024-04-20: qty 1

## 2024-04-20 MED ORDER — MAGNESIUM SULFATE 2 GM/50ML IV SOLN
INTRAVENOUS | Status: AC
  Administered 2024-04-20: 2 g via INTRAVENOUS
  Filled 2024-04-20: qty 50

## 2024-04-20 MED ORDER — IPRATROPIUM-ALBUTEROL 0.5-2.5 (3) MG/3ML IN SOLN
3.0000 mL | RESPIRATORY_TRACT | Status: DC
  Administered 2024-04-20: 3 mL via RESPIRATORY_TRACT

## 2024-04-20 NOTE — ED Provider Notes (Signed)
 [facility] AT [facility] Provider Note   CSN: [id number] Arrival date & time: 04/20/24  2245     Patient presents with: Respiratory Distress   [name] is a 63 y.o. female. Hx of HTN, HLD, IDA, COPD/chronic respiratory failure on 5 L of oxygen , GERD presenting with COPD exacerbation, respiratory distress.  History per EMS, history limited from patient secondary to significant dyspnea.  Per report, approximate 2 hours ago patient started developing worsening SOB.  Attempted to use her treatments at home, however overall ran out, and was only able to use a very small amount.  She then developed crushing chest pain, and worsening SOB.  Therefore, EMS was called.  Patient with significant work of breathing with EMS, given Solu-Medrol  125 mg, 2x DuoNeb, and placed on O2.  On arrival, significant work of breathing, only able to say 1 word at a time.  Placed on BiPAP, GCS 14.  On chart review, patient recently admitted 04/08/2024 through 04/09/2024 for similar.  CT angio was negative at that time.  Significant mucous plugging appreciated.  Admitted for COPD exacerbation, able to titrate down to 5 L nasal cannula back to patient baseline.  Was discharged home in stable condition.  Noted to have numerous admissions for similar, primarily admitting every 2 weeks.  {Add pertinent medical, surgical, social history, OB history to HPI:32947} HPI     Prior to Admission medications   Medication Sig Start Date End Date Taking? Authorizing Provider  albuterol  (PROVENTIL ) (2.5 MG/3ML) 0.083% nebulizer solution INHALE 3 ML BY NEBULIZATION EVERY 6 HOURS AS NEEDED FOR WHEEZING OR SHORTNESS OF BREATH Patient taking differently: Take 2.5 mg by nebulization every 6 (six) hours as needed for wheezing or shortness of breath. 10/02/23   Mannam, Praveen, MD  albuterol  (VENTOLIN  HFA) 108 (90 Base) MCG/ACT inhaler Inhale 2 puffs into the lungs every 6 (six) hours as needed for wheezing  or shortness of breath. 04/09/24   [place], Camellia PARAS, DO  budesonide  (PULMICORT ) 0.5 MG/2ML nebulizer solution Take 2 mLs (0.5 mg total) by nebulization 2 (two) times daily. 03/29/24   Lue Elsie BROCKS, MD  Evolocumab  (REPATHA  SURECLICK) 140 MG/ML SOAJ Inject 140 mg into the skin every 14 (fourteen) days. Patient taking differently: Inject 140 mg into the skin See admin instructions. Inject 140 mg into the skin EVERY OTHER Friday 01/23/24   Revankar, Jennifer SAUNDERS, MD  ferrous sulfate  325 (65 FE) MG tablet Take 1 tablet (325 mg total) by mouth daily with breakfast. 03/05/24   Franchot Novel, MD  Fluticasone -Umeclidin-Vilant (TRELEGY ELLIPTA ) 200-62.5-25 MCG/ACT AEPB Inhale 1 puff into the lungs daily. Patient taking differently: Inhale 1 puff into the lungs daily as needed. 03/29/24 03/29/25  Lue Elsie BROCKS, MD  ipratropium-albuterol  (DUONEB) 0.5-2.5 (3) MG/3ML SOLN Take 3 mLs by nebulization every 6 (six) hours. Patient taking differently: Take 3 mLs by nebulization every 4 (four) hours as needed (for shortness of breath or wheezing). 09/23/23   Barbarann Nest, MD  Mepolizumab  (NUCALA ) 100 MG/ML SOAJ Inject 1 mL (100 mg total) into the skin every 28 (twenty-eight) days. 02/15/24   Mannam, Praveen, MD  nitroGLYCERIN  (NITROSTAT ) 0.4 MG SL tablet Place 1 tablet (0.4 mg total) under the tongue every 5 (five) minutes as needed. Patient taking differently: Place 0.4 mg under the tongue every 5 (five) minutes as needed for chest pain. 03/04/24 06/02/24  Franchot Novel, MD  omeprazole  (PRILOSEC) 20 MG capsule TAKE 1 CAPSULE BY MOUTH EVERY DAY 04/17/24   Mannam, Praveen,  MD  OXYGEN  Inhale 5 L/min into the lungs daily as needed (for shortness of breath).    [provider]  polyethylene glycol (MIRALAX ) 17 g packet Take 17 g by mouth daily. Patient taking differently: Take 17 g by mouth daily as needed for mild constipation. 03/04/24   Franchot Novel, MD  predniSONE  (DELTASONE ) 10 MG tablet  Take 4 tablets (40 mg total) by mouth daily for 3 days, THEN 3 tablets (30 mg total) daily for 3 days, THEN 2 tablets (20 mg total) daily for 3 days, THEN 1 tablet (10 mg total) daily for 3 days. 04/10/24 04/22/24  [place], Eric J, DO  sulfamethoxazole -trimethoprim  (BACTRIM  DS) 800-160 MG tablet Take 1 tablet by mouth 3 (three) times a week. Patient not taking: Reported on 04/08/2024 02/16/24   Mannam, Praveen, MD    Allergies: Acetaminophen , Amoxicillin , Ezetimibe , Ibuprofen , and Statins    Review of Systems  Updated Vital Signs Pulse (!) 101   SpO2 100%   Physical Exam Vitals and nursing note reviewed.  Constitutional:      General: She is in acute distress.     Appearance: She is well-developed. She is ill-appearing.  HENT:     Head: Normocephalic and atraumatic.     Mouth/Throat:     Pharynx: Oropharynx is clear.  Eyes:     Conjunctiva/sclera: Conjunctivae normal.  Cardiovascular:     Rate and Rhythm: Regular rhythm. Tachycardia present.     Pulses: Normal pulses.     Heart sounds: Normal heart sounds. No murmur heard.    No gallop.  Pulmonary:     Effort: Respiratory distress present.     Breath sounds: Wheezing present.     Comments: Very poor air movement throughout all lung fields, very mild expiratory wheezing can be appreciated in the lower lung fields however this is secondary to very poor air movement.  Patient in significant respiratory distress, tachypneic, significant accessory muscle use, abdominal breathing, subcostal and supraclavicular retractions appreciated. Abdominal:     General: Abdomen is flat. There is no distension.     Palpations: Abdomen is soft.     Tenderness: There is no abdominal tenderness. There is no right CVA tenderness, left CVA tenderness or guarding.  Musculoskeletal:        General: No swelling.     Cervical back: Neck supple. No rigidity.     Right lower leg: Edema present.     Left lower leg: Edema present.     Comments: 1+ pitting  edema bilateral lower extremities  Skin:    General: Skin is warm and dry.     Capillary Refill: Capillary refill takes less than 2 seconds.  Neurological:     Mental Status: She is alert and oriented to person, place, and time.  Psychiatric:        Mood and Affect: Mood normal.     (all labs ordered are listed, but only abnormal results are displayed) Labs Reviewed - No data to display  EKG: None  Radiology: No results found.  {Document cardiac monitor, telemetry assessment procedure when appropriate:32947} Procedures   Medications Ordered in the ED  ipratropium-albuterol  (DUONEB) 0.5-2.5 (3) MG/3ML nebulizer solution 3 mL (3 mLs Nebulization Given 04/20/24 2253)    Clinical Course as of 04/20/24 2337  Sat Apr 20, 2024  2311 Patient appears significantly improved at this time.  Will hold on BiPAP at this time.  Significantly improved work of breathing. [BS]  2327 Will remain on CPAP for increased work of  breathing despite reassuring findings on VBG [BS]    Clinical Course User Index [BS] Arlee Katz, MD   {Click here for ABCD2, HEART and other calculators REFRESH Note before signing:1}                              Medical Decision Making Amount and/or Complexity of Data Reviewed Labs: ordered. Radiology: ordered.  Risk Prescription drug management.   ***  {Document critical care time when appropriate  Document review of labs and clinical decision tools ie CHADS2VASC2, etc  Document your independent review of radiology images and any outside records  Document your discussion with family members, caretakers and with consultants  Document social determinants of health affecting pt's care  Document your decision making why or why not admission, treatments were needed:32947:::1}   Final diagnoses:  None    ED Discharge Orders     None

## 2024-04-20 NOTE — Progress Notes (Signed)
 RT responded to level 1 trauma. PT placed on BIPAP.   04/20/24 2300  BiPAP/CPAP/SIPAP  $ Non-Invasive Ventilator  Non-Invasive Vent Initial  $ Face Mask Medium Yes  BiPAP/CPAP/SIPAP Pt Type Adult  BiPAP/CPAP/SIPAP SERVO  Mask Type Full face mask  Dentures removed? Not applicable  Mask Size Medium  Respiratory Rate 29 breaths/min  IPAP 10 cmH20  EPAP 5 cmH2O  Pressure Support 5 cmH20  PEEP 5 cmH20  FiO2 (%) 60 %  Minute Ventilation 9.9  Leak 20  Peak Inspiratory Pressure (PIP) 10  Tidal Volume (Vt) 501  Patient Home Machine No  Patient Home Mask No  Patient Home Tubing No  Auto Titrate No  Press High Alarm 25 cmH2O  Press Low Alarm 2 cmH2O  Nasal massage performed Yes  CPAP/SIPAP surface wiped down Yes  Device Plugged into RED Power Outlet Yes

## 2024-04-20 NOTE — ED Notes (Signed)
 X-ray at bedside.

## 2024-04-20 NOTE — ED Triage Notes (Addendum)
 Pt bib ems from home for resp distress x 2 hours. Pt tried home nebs without relief. 1 hour after her breathing problems started she developed left sided chest pressure.  Pt given  125 solumedrol 5 albuterol  1mg  atrovent  1 nitrogl Pt refused ASA  Pt arrived to ED on neb treatment, absent lung sounds. Pt wears 5L [place] at baseline

## 2024-04-20 NOTE — ED Notes (Signed)
 COPD ex, on 5Lbaseline On Bipap     Hayley Horn Eduardo, MD 04/20/24 2359

## 2024-04-21 ENCOUNTER — Encounter (HOSPITAL_COMMUNITY): Payer: Self-pay | Admitting: Internal Medicine

## 2024-04-21 ENCOUNTER — Emergency Department (HOSPITAL_COMMUNITY)

## 2024-04-21 DIAGNOSIS — J9621 Acute and chronic respiratory failure with hypoxia: Secondary | ICD-10-CM | POA: Diagnosis present

## 2024-04-21 DIAGNOSIS — Z7951 Long term (current) use of inhaled steroids: Secondary | ICD-10-CM | POA: Diagnosis not present

## 2024-04-21 DIAGNOSIS — R0689 Other abnormalities of breathing: Secondary | ICD-10-CM | POA: Diagnosis present

## 2024-04-21 DIAGNOSIS — Z881 Allergy status to other antibiotic agents status: Secondary | ICD-10-CM | POA: Diagnosis not present

## 2024-04-21 DIAGNOSIS — Z8616 Personal history of COVID-19: Secondary | ICD-10-CM | POA: Diagnosis not present

## 2024-04-21 DIAGNOSIS — J9611 Chronic respiratory failure with hypoxia: Secondary | ICD-10-CM

## 2024-04-21 DIAGNOSIS — Z87891 Personal history of nicotine dependence: Secondary | ICD-10-CM | POA: Diagnosis not present

## 2024-04-21 DIAGNOSIS — E669 Obesity, unspecified: Secondary | ICD-10-CM | POA: Diagnosis present

## 2024-04-21 DIAGNOSIS — Z825 Family history of asthma and other chronic lower respiratory diseases: Secondary | ICD-10-CM | POA: Diagnosis not present

## 2024-04-21 DIAGNOSIS — Z888 Allergy status to other drugs, medicaments and biological substances status: Secondary | ICD-10-CM | POA: Diagnosis not present

## 2024-04-21 DIAGNOSIS — J441 Chronic obstructive pulmonary disease with (acute) exacerbation: Secondary | ICD-10-CM | POA: Diagnosis present

## 2024-04-21 DIAGNOSIS — Z823 Family history of stroke: Secondary | ICD-10-CM | POA: Diagnosis not present

## 2024-04-21 DIAGNOSIS — M19011 Primary osteoarthritis, right shoulder: Secondary | ICD-10-CM | POA: Diagnosis present

## 2024-04-21 DIAGNOSIS — K219 Gastro-esophageal reflux disease without esophagitis: Secondary | ICD-10-CM

## 2024-04-21 DIAGNOSIS — E7849 Other hyperlipidemia: Secondary | ICD-10-CM

## 2024-04-21 DIAGNOSIS — Z8701 Personal history of pneumonia (recurrent): Secondary | ICD-10-CM | POA: Diagnosis not present

## 2024-04-21 DIAGNOSIS — J439 Emphysema, unspecified: Secondary | ICD-10-CM | POA: Diagnosis present

## 2024-04-21 DIAGNOSIS — I429 Cardiomyopathy, unspecified: Secondary | ICD-10-CM | POA: Diagnosis present

## 2024-04-21 DIAGNOSIS — E871 Hypo-osmolality and hyponatremia: Secondary | ICD-10-CM

## 2024-04-21 DIAGNOSIS — I2721 Secondary pulmonary arterial hypertension: Secondary | ICD-10-CM | POA: Diagnosis present

## 2024-04-21 DIAGNOSIS — E785 Hyperlipidemia, unspecified: Secondary | ICD-10-CM | POA: Diagnosis present

## 2024-04-21 DIAGNOSIS — I2510 Atherosclerotic heart disease of native coronary artery without angina pectoris: Secondary | ICD-10-CM | POA: Diagnosis present

## 2024-04-21 DIAGNOSIS — H9190 Unspecified hearing loss, unspecified ear: Secondary | ICD-10-CM | POA: Diagnosis present

## 2024-04-21 DIAGNOSIS — I270 Primary pulmonary hypertension: Secondary | ICD-10-CM | POA: Diagnosis not present

## 2024-04-21 DIAGNOSIS — Z886 Allergy status to analgesic agent status: Secondary | ICD-10-CM | POA: Diagnosis not present

## 2024-04-21 DIAGNOSIS — Z8249 Family history of ischemic heart disease and other diseases of the circulatory system: Secondary | ICD-10-CM | POA: Diagnosis not present

## 2024-04-21 DIAGNOSIS — Z9981 Dependence on supplemental oxygen: Secondary | ICD-10-CM | POA: Diagnosis not present

## 2024-04-21 DIAGNOSIS — I10 Essential (primary) hypertension: Secondary | ICD-10-CM

## 2024-04-21 LAB — RESPIRATORY PANEL BY PCR

## 2024-04-21 LAB — COMPREHENSIVE METABOLIC PANEL WITH GFR
ALT: 15 U/L (ref 0–44)
AST: 24 U/L (ref 15–41)
Albumin: 3.5 g/dL (ref 3.5–5.0)
Alkaline Phosphatase: 40 U/L (ref 38–126)
Anion gap: 6 (ref 5–15)
BUN: 7 mg/dL — ABNORMAL LOW (ref 8–23)
CO2: 30 mmol/L (ref 22–32)
Calcium: 7.8 mg/dL — ABNORMAL LOW (ref 8.9–10.3)
Chloride: 92 mmol/L — ABNORMAL LOW (ref 98–111)
Creatinine, Ser: 0.61 mg/dL (ref 0.44–1.00)
GFR, Estimated: >60 mL/min (ref >60)
Glucose, Bld: 99 mg/dL (ref 70–99)
Potassium: 3.8 mmol/L (ref 3.5–5.1)
Sodium: 128 mmol/L — ABNORMAL LOW (ref 135–145)
Total Bilirubin: 0.9 mg/dL (ref 0.0–1.2)
Total Protein: 6 g/dL — ABNORMAL LOW (ref 6.5–8.1)

## 2024-04-21 LAB — CBC
HCT: 32.7 % — ABNORMAL LOW (ref 36.0–46.0)
Hemoglobin: 10 g/dL — ABNORMAL LOW (ref 12.0–15.0)
MCH: 26.5 pg (ref 26.0–34.0)
MCHC: 30.6 g/dL (ref 30.0–36.0)
MCV: 86.5 fL (ref 80.0–100.0)
Platelets: 227 K/uL (ref 150–400)
RBC: 3.78 MIL/uL — ABNORMAL LOW (ref 3.87–5.11)
RDW: 15.7 % — ABNORMAL HIGH (ref 11.5–15.5)
WBC: 7 K/uL (ref 4.0–10.5)
nRBC: 0 % (ref 0.0–0.2)

## 2024-04-21 LAB — TROPONIN I (HIGH SENSITIVITY): Troponin I (High Sensitivity): 4 ng/L (ref <18)

## 2024-04-21 LAB — I-STAT CHEM 8, ED
BUN: 6 mg/dL — ABNORMAL LOW (ref 8–23)
Calcium, Ion: 1.08 mmol/L — ABNORMAL LOW (ref 1.15–1.40)
Chloride: 89 mmol/L — ABNORMAL LOW (ref 98–111)
Creatinine, Ser: 0.7 mg/dL (ref 0.44–1.00)
Glucose, Bld: 99 mg/dL (ref 70–99)
HCT: 33 % — ABNORMAL LOW (ref 36.0–46.0)
Hemoglobin: 11.2 g/dL — ABNORMAL LOW (ref 12.0–15.0)
Potassium: 3.8 mmol/L (ref 3.5–5.1)
Sodium: 129 mmol/L — ABNORMAL LOW (ref 135–145)
TCO2: 30 mmol/L (ref 22–32)

## 2024-04-21 LAB — BASIC METABOLIC PANEL WITH GFR
Anion gap: 7 (ref 5–15)
BUN: 11 mg/dL (ref 8–23)
CO2: 29 mmol/L (ref 22–32)
Calcium: 8.5 mg/dL — ABNORMAL LOW (ref 8.9–10.3)
Chloride: 100 mmol/L (ref 98–111)
Creatinine, Ser: 0.78 mg/dL (ref 0.44–1.00)
GFR, Estimated: >60 mL/min (ref >60)
Glucose, Bld: 112 mg/dL — ABNORMAL HIGH (ref 70–99)
Potassium: 4.8 mmol/L (ref 3.5–5.1)
Sodium: 136 mmol/L (ref 135–145)

## 2024-04-21 LAB — MRSA NEXT GEN BY PCR, NASAL: MRSA by PCR Next Gen: NOT DETECTED

## 2024-04-21 LAB — MAGNESIUM: Magnesium: 1.8 mg/dL (ref 1.7–2.4)

## 2024-04-21 LAB — D-DIMER, QUANTITATIVE: D-Dimer, Quant: 0.43 ug{FEU}/mL (ref 0.00–0.50)

## 2024-04-21 LAB — BRAIN NATRIURETIC PEPTIDE: B Natriuretic Peptide: 12.6 pg/mL (ref 0.0–100.0)

## 2024-04-21 MED ORDER — BENZONATATE 100 MG PO CAPS: 100.0000 mg | ORAL_CAPSULE | Freq: Three times a day (TID) | ORAL | Status: DC | PRN

## 2024-04-21 MED ORDER — PREDNISONE 20 MG PO TABS
60.0000 mg | ORAL_TABLET | Freq: Every day | ORAL | Status: DC
  Administered 2024-04-22 – 2024-04-23 (×2): 60 mg via ORAL
  Filled 2024-04-21 (×2): qty 3

## 2024-04-21 MED ORDER — ORAL CARE MOUTH RINSE: 15.0000 mL | OROMUCOSAL | Status: DC | PRN

## 2024-04-21 MED ORDER — SODIUM CHLORIDE 0.9% FLUSH: 3.0000 mL | INTRAVENOUS | Status: DC | PRN

## 2024-04-21 MED ORDER — ENOXAPARIN SODIUM 40 MG/0.4ML IJ SOSY
40.0000 mg | PREFILLED_SYRINGE | INTRAMUSCULAR | Status: DC
  Administered 2024-04-21 – 2024-04-23 (×3): 40 mg via SUBCUTANEOUS
  Filled 2024-04-21 (×3): qty 0.4

## 2024-04-21 MED ORDER — SODIUM CHLORIDE 0.9 % IV SOLN: INTRAVENOUS | Status: DC

## 2024-04-21 MED ORDER — BUDESON-GLYCOPYRROL-FORMOTEROL 160-9-4.8 MCG/ACT IN AERO
2.0000 | INHALATION_SPRAY | Freq: Two times a day (BID) | RESPIRATORY_TRACT | Status: DC
  Filled 2024-04-21: qty 5.9

## 2024-04-21 MED ORDER — IPRATROPIUM-ALBUTEROL 0.5-2.5 (3) MG/3ML IN SOLN
3.0000 mL | Freq: Four times a day (QID) | RESPIRATORY_TRACT | Status: DC
  Administered 2024-04-21 (×2): 3 mL via RESPIRATORY_TRACT
  Filled 2024-04-21 (×2): qty 3

## 2024-04-21 MED ORDER — ONDANSETRON HCL 4 MG PO TABS: 4.0000 mg | ORAL_TABLET | Freq: Four times a day (QID) | ORAL | Status: DC | PRN

## 2024-04-21 MED ORDER — ONDANSETRON HCL 4 MG/2ML IJ SOLN: 4.0000 mg | Freq: Four times a day (QID) | INTRAMUSCULAR | Status: DC | PRN

## 2024-04-21 MED ORDER — ACETAMINOPHEN 325 MG PO TABS: 650.0000 mg | ORAL_TABLET | Freq: Four times a day (QID) | ORAL | Status: DC | PRN

## 2024-04-21 MED ORDER — MORPHINE SULFATE (PF) 2 MG/ML IV SOLN
2.0000 mg | INTRAVENOUS | Status: DC | PRN
  Administered 2024-04-21 (×3): 2 mg via INTRAVENOUS
  Filled 2024-04-21 (×3): qty 1

## 2024-04-21 MED ORDER — METHYLPREDNISOLONE SODIUM SUCC 125 MG IJ SOLR
80.0000 mg | INTRAMUSCULAR | Status: DC
  Administered 2024-04-21: 80 mg via INTRAVENOUS
  Filled 2024-04-21: qty 2

## 2024-04-21 MED ORDER — SODIUM CHLORIDE 0.9 % IV SOLN: 250.0000 mL | INTRAVENOUS | Status: AC | PRN

## 2024-04-21 MED ORDER — MORPHINE SULFATE (PF) 2 MG/ML IV SOLN
1.0000 mg | Freq: Once | INTRAVENOUS | Status: AC
  Administered 2024-04-21: 1 mg via INTRAVENOUS
  Filled 2024-04-21: qty 1

## 2024-04-21 MED ORDER — IPRATROPIUM-ALBUTEROL 0.5-2.5 (3) MG/3ML IN SOLN
3.0000 mL | Freq: Four times a day (QID) | RESPIRATORY_TRACT | Status: DC | PRN
  Administered 2024-04-21 – 2024-04-23 (×7): 3 mL via RESPIRATORY_TRACT
  Filled 2024-04-21 (×7): qty 3

## 2024-04-21 MED ORDER — FERROUS SULFATE 325 (65 FE) MG PO TABS
325.0000 mg | ORAL_TABLET | Freq: Every day | ORAL | Status: DC
  Administered 2024-04-21 – 2024-04-23 (×3): 325 mg via ORAL
  Filled 2024-04-21 (×3): qty 1

## 2024-04-21 MED ORDER — POLYETHYLENE GLYCOL 3350 17 G PO PACK
17.0000 g | PACK | Freq: Every day | ORAL | Status: DC
  Filled 2024-04-21 (×2): qty 1

## 2024-04-21 MED ORDER — IPRATROPIUM-ALBUTEROL 0.5-2.5 (3) MG/3ML IN SOLN
3.0000 mL | Freq: Three times a day (TID) | RESPIRATORY_TRACT | Status: DC
  Administered 2024-04-22 – 2024-04-23 (×4): 3 mL via RESPIRATORY_TRACT
  Filled 2024-04-21 (×5): qty 3

## 2024-04-21 MED ORDER — BUDESON-GLYCOPYRROL-FORMOTEROL 160-9-4.8 MCG/ACT IN AERO
2.0000 | INHALATION_SPRAY | Freq: Two times a day (BID) | RESPIRATORY_TRACT | Status: DC
  Administered 2024-04-22 – 2024-04-23 (×2): 2 via RESPIRATORY_TRACT
  Filled 2024-04-21 (×2): qty 5.9

## 2024-04-21 MED ORDER — SODIUM CHLORIDE 0.9 % IV BOLUS
500.0000 mL | Freq: Once | INTRAVENOUS | Status: AC
  Administered 2024-04-21: 500 mL via INTRAVENOUS

## 2024-04-21 MED ORDER — SODIUM CHLORIDE 0.9% FLUSH
3.0000 mL | Freq: Two times a day (BID) | INTRAVENOUS | Status: DC
  Administered 2024-04-21 – 2024-04-23 (×6): 3 mL via INTRAVENOUS

## 2024-04-21 MED ORDER — SODIUM CHLORIDE 0.9 % IV SOLN
500.0000 mg | INTRAVENOUS | Status: DC
  Administered 2024-04-21 – 2024-04-22 (×2): 500 mg via INTRAVENOUS
  Filled 2024-04-21 (×2): qty 5

## 2024-04-21 MED ORDER — PANTOPRAZOLE SODIUM 40 MG PO TBEC: 40.0000 mg | DELAYED_RELEASE_TABLET | Freq: Every day | ORAL | Status: DC

## 2024-04-21 MED ORDER — IPRATROPIUM-ALBUTEROL 0.5-2.5 (3) MG/3ML IN SOLN
3.0000 mL | RESPIRATORY_TRACT | Status: DC
  Filled 2024-04-21: qty 3

## 2024-04-21 NOTE — ED Notes (Signed)
 Pt incontinent of urine. Pt and linens cleaned and changed and peri care provided.

## 2024-04-21 NOTE — Plan of Care (Signed)
  Problem: Clinical Measurements: Goal: Ability to maintain clinical measurements within normal limits will improve Outcome: Progressing Goal: Will remain free from infection Outcome: Progressing Goal: Diagnostic test results will improve Outcome: Progressing Goal: Respiratory complications will improve Outcome: Progressing Goal: Cardiovascular complication will be avoided Outcome: Progressing   Problem: Health Behavior/Discharge Planning: Goal: Ability to manage health-related needs will improve Outcome: Progressing   Problem: Education: Goal: Knowledge of General Education information will improve Description: Including pain rating scale, medication(s)/side effects and non-pharmacologic comfort measures Outcome: Progressing   Problem: Activity: Goal: Risk for activity intolerance will decrease Outcome: Progressing   Problem: Nutrition: Goal: Adequate nutrition will be maintained Outcome: Progressing   Problem: Coping: Goal: Level of anxiety will decrease Outcome: Progressing   Problem: Elimination: Goal: Will not experience complications related to bowel motility Outcome: Progressing Goal: Will not experience complications related to urinary retention Outcome: Progressing   Problem: Pain Managment: Goal: General experience of comfort will improve and/or be controlled Outcome: Progressing   Problem: Safety: Goal: Ability to remain free from injury will improve Outcome: Progressing   Problem: Skin Integrity: Goal: Risk for impaired skin integrity will decrease Outcome: Progressing   Problem: Education: Goal: Knowledge of disease or condition will improve Outcome: Progressing Goal: Knowledge of the prescribed therapeutic regimen will improve Outcome: Progressing Goal: Individualized Educational Video(s) Outcome: Progressing   Problem: Activity: Goal: Ability to tolerate increased activity will improve Outcome: Progressing Goal: Will verbalize the  importance of balancing activity with adequate rest periods Outcome: Progressing   Problem: Respiratory: Goal: Ability to maintain a clear airway will improve Outcome: Progressing Goal: Levels of oxygenation will improve Outcome: Progressing Goal: Ability to maintain adequate ventilation will improve Outcome: Progressing

## 2024-04-21 NOTE — ED Provider Notes (Signed)
 I assumed care of this patient from previous provider.  Please see their note for further details of history, exam, and MDM.   Briefly patient is a 63 y.o. female who presented respiratory distress thought to be COPD exacerbation initially.  Treated with nebulizers, Solu-Medrol , magnesium .  Currently on BiPAP.  Awaiting imaging and labs.  Chest x-ray without evidence of pneumonia, pneumothorax, pulmonary edema pleural effusion. EKG without acute ischemic changes.  Troponin negative.  BNP normal not concerning for CHF.  Dimer was negative -PE unlikely.  Patient is doing well.  Will attempt to wean off of BiPAP.  Calling medicine for admission Spoke with Dr. Sundil who admitted patient.      Tami Raynell Moder, MD 04/21/24 [phone]

## 2024-04-21 NOTE — Care Management (Signed)
 Consult for medication assistance. The patient has insurance therefore is ineligible for [facility] medication assistance. Recommend sending discharge medications to [facility] pharmacy

## 2024-04-21 NOTE — Plan of Care (Signed)
  Patient has been weaned down BiPAP to nasal cannula oxygen  5 L.  O2 sat in between 96 to 98%.  Patient also reported that she is feeling much better with breathing treatment as well.  De-escalating level of care progressive unit to telemetry bed.  Finley Dinkel, MD [facility] 04/21/2024, 2:32 AM

## 2024-04-21 NOTE — Plan of Care (Addendum)
 Patient requesting for pain medication.  She is allergic to both ibuprofen  and acetaminophen  verified with pharmacy. Giving morphine  1 mg just one-time dose.  Need to be cautious with narcotics given patient just came out of BiPAP and transitioned to [place] oxygen .  Michaela Shankel, MD [facility] 04/21/2024, 3:11 AM
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
## 2024-04-21 NOTE — Progress Notes (Signed)
 RT weaned PT off of BIPAP to [place] 5L. O2 sats 97-98%. BIPAP at bedside. PT states breathing is much better.

## 2024-04-21 NOTE — ED Notes (Signed)
 Phleb stuck twice  but was unable to get labs

## 2024-04-21 NOTE — Progress Notes (Signed)
 RT weaned O2 to 40% on BIPAP. Will see how PT tolerates within the next 20 minutes.

## 2024-04-21 NOTE — Progress Notes (Signed)
 Courtesy visit- No billing-  Patient is seen and examined today morning. She is admitted to [facility] service for COPD exacerbation. Patient is lying in bed feels better than when she came in.  Attributes shortness of breath due to taking Repatha  2 days ago and states symptoms usually coincides with Repatha  injection administration.  She is on 5L oxygen  saturating 100%, decreased to 3L.  She is off BiPAP, apparently uses 5L home o2, advised to go down on O2 with rest to 3L.  Plan to continue her on DuoNebs, IV azithromycin , taper to oral steroids, continue to monitor electrolytes.  Respiratory viral panel came back negative. She asked for pain meds, did mention right shoulder pain. Advised to talk to PCP regarding repatha  injection causing her dyspnea symptoms (Flu like symptoms is reported adverse effects). Home meds reviewed. Further management as per clinical course.

## 2024-04-22 ENCOUNTER — Inpatient Hospital Stay (HOSPITAL_COMMUNITY)

## 2024-04-22 LAB — BASIC METABOLIC PANEL WITH GFR
Anion gap: 9 (ref 5–15)
BUN: 9 mg/dL (ref 8–23)
CO2: 34 mmol/L — ABNORMAL HIGH (ref 22–32)
Calcium: 8.9 mg/dL (ref 8.9–10.3)
Chloride: 97 mmol/L — ABNORMAL LOW (ref 98–111)
Creatinine, Ser: 0.64 mg/dL (ref 0.44–1.00)
GFR, Estimated: >60 mL/min (ref >60)
Glucose, Bld: 84 mg/dL (ref 70–99)
Potassium: 4.3 mmol/L (ref 3.5–5.1)
Sodium: 140 mmol/L (ref 135–145)

## 2024-04-22 LAB — ECHOCARDIOGRAM LIMITED
Area-P 1/2: 4.83 cm2
Height: 62 in
S' Lateral: 3.3 cm
Weight: 2433.88 [oz_av]

## 2024-04-22 MED ORDER — OXYCODONE HCL 5 MG PO TABS
5.0000 mg | ORAL_TABLET | ORAL | Status: DC | PRN
  Filled 2024-04-22: qty 1

## 2024-04-22 MED ORDER — PANTOPRAZOLE SODIUM 40 MG PO TBEC
40.0000 mg | DELAYED_RELEASE_TABLET | Freq: Every day | ORAL | Status: DC
  Administered 2024-04-22 – 2024-04-23 (×2): 40 mg via ORAL
  Filled 2024-04-22 (×2): qty 1
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
## 2024-04-22 NOTE — Plan of Care (Signed)

## 2024-04-22 NOTE — Progress Notes (Signed)
 Patient request BiPap off and go back on Dean.  Requests breathing treatment at this time.  Wheezing noted.  Duo neb given.  O2 5L [place] on patient.  NAD.

## 2024-04-22 NOTE — Progress Notes (Signed)
 Consultation Progress Note   Patient: [name] FMW:[id number] DOB: February 21, 1961 DOA: 04/20/2024 DOS: the patient was seen and examined on 04/22/2024 Primary service: DibiaLandon BRAVO, MD  Brief hospital course:  63 y.o. female with medical history significant of chronic hypoxic respiratory failure 5 L oxygen  at baseline, COPD, hypertension, hyperlipidemia, iron deficiency anemia, and GERD history of recurrent hospital admission for COPD exacerbation presented to emergency department via EMS for evaluation for respiratory distress. EMS stated Rizzolo Medrol , albuterol , sublingual nitroglycerin  as patient was complaining about left-sided chest pressure.  Presentation to ED patient placed on BiPAP maintaining O2 sat 100%. Hemodynamically stable.  Afebrile.     Chest x-ray showing emphysema.  Dilated pulmonary arteries compatible with pulmonary arterial hypertension. No acute cardiopulmonary disease.    Hospitalist consulted for  management of COPD exacerbation with respiratory distress.                                                                                                                                                                            Assessment and Plan: COPD with acute exacerbation Chronic hypoxic respiratory failure  with hypoxia Respiratory distress requiring BiPAP - Patient presented with shortness of breath wearing BiPAP in the emergency department.  Recently she has been weaned down to 5 L of oxygen  which is her baseline. --CXR shows  Emphysema, Dilated central pulmonary arteries compatible with pulmonary arterial, hypertension.No acute cardiopulmonary disease. ---Obtain echo for further eval of her Pulmonary arteries, PAH? --Continue DuoNebs, LABA/ICS, azithromycin  --Clinically improved as of this morning. --Major complaint is right shoulder pain - Continue supportive care. -Educated and encourage patient to use Trellegy Ellipta on daily basis instead of as  needed.  Right shoulder pain -Right shoulder x-ray showed degenerative changes in the [facility] and glenohumeral joints with joint space narrowing and spurring.  No acute fracture, no malalignment. Management with oxycodone  5 mg Q4. Continued morphine  due to patient's complaint of worsening respiratory issues after receiving medication.     Hyponatremia-resolved Patient presented with sodium of 128, status post IV hydration with improvement to 140.   History of GERD Continue Protonix    Essential hypertension -Presently normotensive.  At home patient is not any blood pressure regimen.   DVT prophylaxis:  Lovenox  Code Status:  Full Code Diet: Heart healthy diet Family Communication:   Family was present at bedside, at the time of interview. Opportunity was given to ask question and all questions were answered satisfactorily.  Disposition Plan: Continue to monitor improvement of respiratory status and COPD consideration. Consults: Respiratory therapist for managing BiPAP Admission status:   Inpatient, progressive unit       TR[facility] will continue to follow the patient.  Subjective: Seen at bedside this morning, she  states her breathing has improved however her shoulder pain has been a major concern for her.  She tells me she has osteoarthritis of her right shoulder.  Early hours of this morning patient states that she was placed on the BiPAP due to difficulty with her breathing receiving morphine .  He also tells me she is allergic to Tylenol  and ibuprofen .  We made a shared decision to discontinue morphine  and give oxycodone  a trial.  Physical Exam: Vitals:   04/22/24 0416 04/22/24 0457 04/22/24 0700 04/22/24 0812  BP: 117/89  107/80   Pulse: 100   92  Resp: 20 20 14    Temp: 98.4 F (36.9 C)  98.4 F (36.9 C)   TempSrc: Oral  Oral   SpO2: 100%  97% 100%  Weight:      Height:      General  No acute Distress Eyes: PERRL, lids and conjunctivae normal ENMT: Mucous membranes are moist.    Neck: normal, supple, no masses, no thyromegaly Respiratory: Diminished breath sounds bilaterally, on 4.5 L of oxygen , no accessory muscle use.  Cardiovascular: Regular rate and rhythm, no murmurs / rubs / gallops Abdomen: Soft, nontender nondistended. Musculoskeletal: no clubbing / cyanosis. No joint deformity upper and lower extremities.  Skin: no rashes, lesions, ulcers. No induration Neurologic: Facial asymmetry, moving extremity spontaneously, speech fluent. Psychiatric: Normal judgment and insight. Alert and oriented x 3. Normal mood.    Data Reviewed:  Results are pending, will review when available. CBC    Component Value Date/Time   WBC 7.0 04/21/2024 0600   RBC 3.78 (L) 04/21/2024 0600   HGB 10.0 (L) 04/21/2024 0600   HCT 32.7 (L) 04/21/2024 0600   PLT 227 04/21/2024 0600   MCV 86.5 04/21/2024 0600   MCH 26.5 04/21/2024 0600   MCHC 30.6 04/21/2024 0600   RDW 15.7 (H) 04/21/2024 0600   LYMPHSABS 2.2 04/20/2024 2301   MONOABS 0.9 04/20/2024 2301   EOSABS 0.0 04/20/2024 2301   BASOSABS 0.0 04/20/2024 2301  CMP     Component Value Date/Time   NA 140 04/22/2024 0759   NA 142 10/27/2023 0823   K 4.3 04/22/2024 0759   CL 97 (L) 04/22/2024 0759   CO2 34 (H) 04/22/2024 0759   GLUCOSE 84 04/22/2024 0759   BUN 9 04/22/2024 0759   BUN 8 10/27/2023 0823   CREATININE 0.64 04/22/2024 0759   CALCIUM 8.9 04/22/2024 0759   PROT 6.0 (L) 04/20/2024 2301   PROT 6.1 11/15/2023 0941   ALBUMIN 3.5 04/20/2024 2301   ALBUMIN 4.2 11/15/2023 0941   AST 24 04/20/2024 2301   ALT 15 04/20/2024 2301   ALKPHOS 40 04/20/2024 2301   BILITOT 0.9 04/20/2024 2301   BILITOT 0.3 11/15/2023 0941   EGFR 98 10/27/2023 0823   GFRNONAA >60 04/22/2024 0759      Time spent: 35 minutes.  Author: Landon FORBES Baller, MD 04/22/2024 11:10 AM  For on call review [email or web address].

## 2024-04-22 NOTE — Discharge Instructions (Signed)
 [employer] assistance programs Crisis assistance programs  -[facility]eless[facility] Arts development officer. If you are experiencing homelessness in [place], [place] , your first point of contact should be Pensions consultant. You can reach Coordinated Entry by calling (336) [phone] or by emailing [email or web address] [email or web address].  Community access points: [employer] [phone] [address], [place]) every Tuesday from 9am-10am. [facility] ([address] [place]) every Wednesday from 8am-9am.   -[place] Coordinated Re-entry Jayson Michael: Dial 211 and request. Offers referrals to homeless shelters in the area.    -The [employer] 507-604-8943) offers several services to local families, as funding allows. The Emergency Assistance Program (EAP), which they administer, provides household goods, free food, clothing, and financial aid to people in need in the [place]  area. The EAP program does have some qualification, and counselors will interview clients for financial assistance by written referral only. Referrals need to be made by the Department of Social Services or by other EAP approved human services agencies or charities in the area.  -[facility] of [employer], which can be reached at [phone], offers emergency assistance programs for those in need of help, such as food, rent assistance, a soup kitchen, shelter, and clothing. They are based in [facility] [place]  but provide a number of services to those that qualify for assistance.   [facility] Department of Social Services may be able to offer temporary financial assistance and cash grants for paying rent and utilities, Help may be provided for local county residents who may be experiencing personal crisis when other resources, including government programs, are not available. Call [phone]  -High [employer] is a [employer] agency, The organization can offer emergency assistance for paying rent, Caremark Rx, utilities, food, household products and furniture. They offer extensive emergency and transitional housing for families, children and single women, and also run a Boy's and [employer]. Thrift Shops, Secondary school teacher, and other aid offered too. [address], [place], [place]  [address], [phone]  -[place] Low Income Energy Assistance Program -- This is offered for [facility] families. The federal government created [employer] Program provides a one-time cash grant payment to help eligible low-income families pay their electric and heating bills. [address], [place], [place]  [address], [phone]  -[place] Emergency Assistance -- A program offers emergency utility and rent funds for greater [employer] area residents. The program can also provide counseling and referrals to charities and government programs. Also provides food and a free meal program that serves lunch Mondays - Saturdays and dinner seven days per week to individuals in the community. [address] [place], [place]  [address], [phone]  -[employer] - Offers affordable apartment and housing communities across      [place] and [place]. The low income and seniors can access public housing, rental assistance to qualified applicants, and apply for the section 8 rent subsidy program. Other programs include Chiropractor and Engineer, maintenance. [address], [place],   [address], dial [phone].  -[facility] provides transitional housing to veterans and the disabled. Clients will also access other services too, including assistance in applying for Disability, life skills classes, case management, and assistance in finding permanent housing. [address], [place], [place]  [place] [address], call [phone]  -[facility] [place] Transitional Housing through [facility] is for people who were just  evicted or that are formerly homeless. The non-profit will also help then gain self-sufficiency, find a home or apartment to live in, and also provides information on rent assistance when needed. Phone [phone]  -The [place] [facility] [facility] helps low income, elderly, or disabled residents in seven counties in the [place] ([place], [place], [place], [place], [place], [place], [place], and [place]) save energy and reduce their utility bills by improving energy efficiency. Phone [phone].  -[employer] is located in the [place] [facility] in the [employer], [address], [address], [place], [place] [address]. Parking is in the rear of the building. Phone: [phone]   General Email: info@gsohc .org  GHC provides free housing counseling assistance in locating affordable rental housing or housing with support services for families and individuals in crisis and the chronically homeless. We provide potential resources for other housing needs like utilities. Our trained counselors also work with clients on budgeting and financial literacy in effort to empower them to take control of their financial situations. [employer] collaborates with homeless service providers and other stakeholders as part of the [employer] COC (Continuum of Care). The (COC) is a regional/local planning body that coordinates housing and services funding for homeless families and individuals. The role of [facility] in the COC is through housing counseling to work with people we serve on diversion strategies for those that are at imminent risk of becoming homeless. We also work with the Coordinated Assessment/Entry Specialist who attempts to find temporary solutions and/or connects the people  to Housing First, Rapid Re-housing or transitional housing programs. Our Homelessness Prevention Housing Counselors meet with clients on business days (Monday-Fridays, except scheduled holidays) from 8:30 am to 4:30 pm.  Legal assistance for evictions, foreclosure, and more -If you need free legal advice on civil issues, such as foreclosures, evictions, Electronics engineer, government programs, domestic issues and more, Landscape architect of [place]  [facility]) is a Associate Professor firm that provides free legal services and counsel to lower income people, seniors, disabled, and others, The goal is to ensure everyone has access to justice and fair representation. Call them at [phone].  [facility] for Housing and Community Studies can provide info about obtaining legal assistance with evictions. Phone [phone].  Data processing manager  The [employer], [employer]. offers job and Dispensing optician. Resources are focused on helping students obtain the skills and experiences that are necessary to compete in today's challenging and tight job market. The non-profit faith-based community action agency offers internship trainings as well as classroom instruction. Classes are tailored to meet the needs of people in the [facility] region. [place], [place] [address], [phone]  Foreclosure prevention/Debt Services Family Services of the [employer] Credit Counseling Service inludes debt and foreclosure prevention programs for local families. This includes money management, financial advice, budget review and development of a written action plan with a Pensions consultant to help solve specific individual financial problems. In addition, housing and mortgage counselors can also provide pre- and post-purchase homeownership counseling, default resolution counseling (to prevent foreclosure) and reverse mortgage counseling. A Debt Management Program allows  people and families with a high level of credit card or medical debt to consolidate and repay consumer debt and loans to creditors and rebuild positive credit ratings and scores. Contact (336) D7650557.  Community clinics in [place] -Health Department [facility]: [address] [place], [place], [address]. [phone].  -Health Department [facility]: [phone]  [address], [facility], [address]. [phone].  -[facility] offers medical care through a group of doctors, pharmacies and other healthcare related agencies that offer services for low income, uninsured adults in [place]. Also offers adult Dental care and assistance with applying for an [employer]. Call [phone].   [facility]. This center provides low-cost health care to those without health insurance. Services offered include an onsite pharmacy. Phone [phone]. [address], [address], [place].  -Medication Assistance Program serves as a link between pharmaceutical companies and patients to provide low cost or free prescription medications. This service is available for residents who meet certain income restrictions and have no insurance coverage. PLEASE CALL [phone] Jonette Nestle) OR [phone] ([place])  -One Step Further: Materials engineer, The [employer] Support & Nutrition Program, [employer]. Call [phone]/ [phone].  Food pantry and assistance -Urban Ministry-Food Bank: [address], [place] [address]. Phone [phone]  -[facility] Food Pantry: [address], [place], [place] [address]. [phone].  -Missionary Ministry: has the purpose of visiting the sick and shut-ins and provide for needs in the surrounding communities. Call ([phone]. Email: [email or web address] This program provides: Food box for seniors, Financial assistance, Food to meet basic  nutritional needs.  -Meals on Wheels with Senior Resources: [facility] residents age 37 and over who are homebound and unable to obtain and prepare a nutritious meal for themselves are eligible for this service. There may be a waiting list in certain parts of [facility] if the route in that area is full. If you are in [facility] and [place] call [phone] to register. For all other areas call [phone] to register.  -Greater Dietitian: https://findfood.BargainContractor.[email or web address]  TRANSPORTATION: -[employer] Department of Health: Call [facility] and [employer] at [phone] for details. AttractionGuides.es  -Access [place]: Access [place] is the [employer] Agency's shared-ride transportation service for eligible riders who have a disability that prevents them from riding the fixed route bus. Call [phone]. Access [place] riders must pay a fare of $1.50 per trip, or may purchase a 10-ride punch card for $14.00 ($1.40 per ride) or a 40-ride punch card for $48.00 ($1.20 per ride).  -The Shepherd's WHEELS rideshare transportation service is provided for senior citizens (60+) who live independently within [place] city limits and are unable to drive or have limited access to transportation. Call [phone] to schedule an appointment.  -[place] Transportation: For Medicare or Medicaid recipients call [phone]?[facility], wheelchair Carloyn Chi, and ambulatory quotes available.   FLEEING VIOLENCE: -Family Services of the [place]- 24/7 Crisis line [phone]) -[facility] Justice Centers: (336) [phone])  [place] 2-1-1 is another useful way to locate resources in the community. Visit ShedSizes.ch to find service information online. If you need additional assistance, 2-1-1 Referral Specialists are available 24 hours a day, every day by dialing  2-1-1 or [phone] from any phone. The call is free, confidential, and available in any language.  Affordable Housing Search http://www.nchousingsearch.[facility])   M-F 8a-3p [address] [place]  [place], [place] [address] [phone] Services include: laundry, barbering, support groups, case management, phone & computer access, showers, AA/NA mtgs, mental health/substance abuse nurse, job skills class, disability information, VA assistance, spiritual classes, etc. Winter Shelter available when temperatures are less than 32 degrees.   HOMELESS SHELTERS [facility] Night [facility] at [facility]- Call [phone] ext. [phone]  or ext. [phone]. Located at [address], [place], [place] [address]  [facility] Mens Shelter- Call [phone]. Located at [address] [place] [address].  [facility] [employer]. Call [phone]. Office located at [address], [place] [address].  Pathways Family Housing through [place] [phone].  [facility] Family Shelter- Call [phone]. Located at [address] [place], [place], [place] [address].  Room at the Inn-For Pregnant mothers. Call [phone]. Located at [address] [place], [address].  [facility]-For men in [place]. Call [phone]. [facility]-Shelter in [place]. Call [phone].  Home of [employer] for [employer] [phone]. Office located at [address], [place], [address].  [employer] be agreeable to help with chores. Call [phone] ext. [phone].  Men's: [address] [place], [place] [address]. Women's: [facility]  [address] [place], [place] [address]  Crisis Services Therapeutic Alternatives Mobile Crisis Management- [phone]  [facility] [address], [place], [place] [address]. Phone: [phone] Rent/Utility Assistance in  [facility]:  INNOVATIVE PATHWAYS [address] [place], [place] [address] [phone] Mon 8:00am - 6:00pm; Tue 8:00am - 6:00pm; Wed 8:00am - 6:00pm; Thu 8:00am - 6:00pm; Fri 8:00am - 6:00pm; Email: [email or web address] Eligibility: Residents of [place], [place], [place], [place], [place] and [place] that meet income limits. Call or text for eligibility screening.   [facility] MINISTRY [address] [place], [place], [place] [address] [phone] (Main: Rental Assistance) [phone] (Main: Utility Assistance) Mon 8:30am - 5:00pm; Tue 8:30am - 5:00pm; Wed 8:30am - 5:00pm; Thu 8:30am - 5:00pm; Fri 8:30am - 5:00pm; Website: http://www.greensborourbanministry.org/emergency-assistance-program Eligibility: People who have an unexpected crisis or emergency that can be verified. Must have some form of income and meet income limits. At the first of the month, only helps with rent/mortgage assistance for those who have court ordered eviction notices. Call for application information. Call for exact documents that will be needed. Examples of documents that may be needed: Photo ID, Social Security cards for everyone in the household, and proof of income for previous 2 months. Copy of eviction notice for rent assistance and copy of final notice for utility assistance. Statements or receipts of bills for previous 2 months.   [facility] - [place] [address], [place], [place] [address] [phone] (Main) [phone] (Alternate) Mon 9:00am - 5:00pm; Tue 9:00am - 5:00pm; Wed 9:00am - 5:00pm; Thu 9:00am - 5:00pm; Fri 9:00am - 5:00pm; Website: http://[email or web address].salvationarmy.org/[place]/emergency-financial-assistance Email: [email or web address] Eligibility: People experiencing a housing crisis with past-due rent and/or utilities and meet income limits. Must be willing to take part in 6 Call or visit website to download  application. Return complete application by mail or email only. Documents: Help with Utilities: Photo ID, proof of household income, copies of monthly bills or receipts, and a final disconnection/shut-off notice. Help with Rent or Mortgage: Photo ID, proof of income, copies of monthly bills or receipts, and eviction notice. Help with Household Goods: Photo ID, proof of household income, copies of monthly bills or receipts, and a fire or flood report.  [facility] [place] [address], [place], [place] [address] [phone] (Main) Mon 8:00am - 5:00pm; Tue 8:00am - 5:00pm; Wed 8:00am - 5:00pm; Thu 8:00am - 5:00pm; Fri 8:00am - 12:00pm; Website: http://southernusa.salvationarmy.org/[place]/emergency-financial-assistance Email: [email or web address] Call for eligibility information. Apply :Utilities Assistance: Visit office by 8:30am on 1st and 4th Monday of each  month to pick up application. Rent and Mortgage Assistance: Visit office by 8:30am on 2nd and 3rd Monday of each month to pick up application. NOTE: If Monday falls on a holiday applications can be picked up the following Tuesday. Documents required will be listed on application.  [facility] [place] [phone] (Main) Seen by appointment only. Call for more information. Eligibility: Meet income limits. Apply: Call for information on how to schedule an appointment. Each month there is a specific day to call to schedule an appointment. It is stated on the agency voicemail message. Appointments fill up quickly each month. Documents: Photo ID, copy of current utility bill.  [facility] [place] [address] [place], [place] [address] [phone] (Main) Tue 9:00am - 4:00pm; Wed 9:00am - 4:00pm; Thu 9:00am - 4:00pm; Website: http://www.[email or web address] Email: helpinghandsclientassistance@gmail .com Eligibility: Utility Assistance: Meet income limits and be a Haematologist. [employer] customers do not qualify. Must not have received utility assistance for another agency within the last 90 days. Rent Assistance: Residents of [place] who meet income limits. Must not have received rent assistance for another agency within the last 90 days. Apply: Call to schedule an appointment. Documents: Utility Assistance: Photo ID, City of [employer], copy of lease (if not paying a mortgage), proof of income, and monthly expenses. Rent Assistance: Photo ID, W-9 from the landlord, copy of the lease, proof of income, and a list of monthly expenses.  [facility] [place] [address] [place], [place] [address] [phone] (Main: Help With Rent) [phone] (Main: Help With Utilities) Mon 9:00am - 4:00pm; Tue 9:00am - 4:00pm; Wed 9:00am - 4:00pm; Thu 9:00am - 4:00pm; Fri 9:00am - 4:00pm; Website: MotivationalSites.no Email: [email or web address] Eligibility: People experiencing a financial crisis. Apply: Call to schedule an appointment Wednesday, 7:30am. Documents: Photo ID, Social Security card, proof of income, and proof of address. Other documents may be required, depending on service. Call for more information.  LOW INCOME ENERGY ASSISTANCE PROGRAM DEPARTMENT OF SOCIAL SERVICES - [facility] [address], [place], [place] [address] [phone] (Main) Mon 8:00am - 5:00pm; Tue 8:00am - 5:00pm; Wed 8:00am - 5:00pm; Thu 8:00am - 5:00pm; Fri 8:00am - 5:00pm; Website: [email or web address] Eligibility: Meet income limits and resource guidelines. Each household is only eligible once, even if multiple members apply. Apply: Call to see if funds are available. Visit to complete an application, call to have 1 mailed, or apply online at [email or web address] NOTE: Households with a person age 47  and over or a person with a documented disability can apply beginning December 1. Other households can apply beginning January 1. Documents: Photo ID, birth certificate, proof of household income, copy of utility bill, latest bank statement, the names and Social Security numbers for everyone in the household, and proof of disability if under age 103.  LOW INCOME ENERGY ASSISTANCE PROGRAM DEPARTMENT OF SOCIAL SERVICES - [facility] [address] [place], [place], [place] [address] [phone] (Main) Mon 8:00am - 5:00pm; Tue 8:00am - 5:00pm; Wed 8:00am - 5:00pm; Thu 8:00am - 5:00pm; Fri 8:00am - 5:00pm; Website: [email or web address] Eligibility: Meet income limits and resource guidelines. Each household is only eligible once, even if multiple members apply. Apply: Call to see if funds are available. Visit to complete an application, call to have 1 mailed, or apply online at [email or web address] NOTE: Households with a person age 47 and over or a person with a documented  disability can apply beginning December 1. Other households can apply beginning January 1. Documents: Photo ID, birth certificate, proof of household income, copy of utility bill, latest bank statement, the names and Social Security numbers for everyone in the household, and proof of disability if under age 11.

## 2024-04-22 NOTE — Progress Notes (Signed)
   04/22/24 2234  BiPAP/CPAP/SIPAP  $ Non-Invasive Ventilator  Non-Invasive Vent Subsequent  BiPAP/CPAP/SIPAP Pt Type Adult  BiPAP/CPAP/SIPAP SERVO  Mask Type Full face mask  Dentures removed? Yes - Placed in denture cup  Mask Size Small  Set Rate 14 breaths/min  Respiratory Rate 24 breaths/min  IPAP 10 cmH20  EPAP 5 cmH2O  Pressure Support 5 cmH20  PEEP 5 cmH20  FiO2 (%) 40 %  Minute Ventilation 14.3  Leak 12  Peak Inspiratory Pressure (PIP) 10  Tidal Volume (Vt) 484  Patient Home Machine No  Patient Home Mask No  Patient Home Tubing No  Auto Titrate No  Press High Alarm 25 cmH2O  Press Low Alarm 5 cmH2O  Device Plugged into RED Power Outlet Yes  Oxygen  Percent 40 %  BiPAP/CPAP /SiPAP Vitals  Pulse Rate (!) 101  Resp (!) 21  SpO2 100 %  MEWS Score/Color  MEWS Score 2  MEWS Score Color Yellow

## 2024-04-22 NOTE — Progress Notes (Signed)
 Pt c/o wheeze & SOB, requesting breathing treatment.  Duo neb given.  Patient states she did not get any relief.  Lungs coarse with wheeze.  O2 sats 99% on 5L [place].  RT called and requested to come to room and assess patient.  Patient NAD.

## 2024-04-22 NOTE — Progress Notes (Signed)
 Mobility Specialist Progress Note:   04/22/24 1000  Mobility  Activity Ambulated with assistance  Level of Assistance Contact guard assist, steadying assist  Assistive Device None  Distance Ambulated (ft) 300 ft  Activity Response Tolerated well  Mobility Referral Yes  Mobility visit 1 Mobility  Mobility Specialist Start Time (ACUTE ONLY) 0940  Mobility Specialist Stop Time (ACUTE ONLY) [id number]  Mobility Specialist Time Calculation (min) (ACUTE ONLY) 12 min   Pt received in bed agreeable to mobility, found on 4L/min. Ambulated on 3L/min VSS. No c/o throughout. Returned to room w/o  fault. Left in bed w/ call bell and personal belongings in reach. All needs met. Left on 4L/min.   Pre Mobility 4L/min SPO2 96% During Mobility 3L/min SpO2 93%-97% Post Mobility 4L/min Spo 2 98%  Thersia Minder Mobility Specialist  Please contact vis Secure Chat or  Rehab Office [phone]

## 2024-04-22 NOTE — Plan of Care (Signed)
   Problem: Activity: Goal: Risk for activity intolerance will decrease Outcome: Progressing   Problem: Nutrition: Goal: Adequate nutrition will be maintained Outcome: Progressing   Problem: Coping: Goal: Level of anxiety will decrease Outcome: Progressing   Problem: Elimination: Goal: Will not experience complications related to bowel motility Outcome: Progressing Goal: Will not experience complications related to urinary retention Outcome: Progressing

## 2024-04-23 MED ORDER — PREDNISONE 10 MG PO TABS: ORAL_TABLET | ORAL | 0 refills | Status: DC

## 2024-04-23 NOTE — Plan of Care (Signed)

## 2024-04-23 NOTE — Progress Notes (Signed)
 Mobility Specialist: Progress Note   04/23/24 1231  Mobility  Activity Ambulated with assistance  Level of Assistance Standby assist, set-up cues, supervision of patient - no hands on  Assistive Device None  Distance Ambulated (ft) 350 ft  Activity Response Tolerated well  Mobility Referral Yes  Mobility visit 1 Mobility  Mobility Specialist Start Time (ACUTE ONLY) 0910  Mobility Specialist Stop Time (ACUTE ONLY) 0920  Mobility Specialist Time Calculation (min) (ACUTE ONLY) 10 min    Pt received on EOB, agreeable to mobility session. SV throughout. SpO2 >90% on 4LO2. No complaints. Returned to room. Left on EOB with all needs met, call bell in reach.   Ileana Lute Mobility Specialist Please contact via SecureChat or Rehab office at [phone]

## 2024-04-23 NOTE — Discharge Summary (Signed)
 Physician Discharge Summary   Patient: [name] MRN: [id number] DOB: 08-10-1960  Admit date:     04/20/2024  Discharge date: 04/23/24  Discharge Physician: Landon BRAVO Le Faulcon   PCP: Rosalea Rosina [name], PA   Recommendations at discharge:    Follow up with PCP  Discharge Diagnoses: Principal Problem:   COPD with acute exacerbation (HCC) Active Problems:   Hyponatremia   GERD (gastroesophageal reflux disease)   Hyperlipidemia   Essential hypertension   Chronic hypoxic respiratory failure (HCC)  Resolved Problems:   * No resolved hospital problems. *  Hospital Course: 63 y.o. female with medical history significant of chronic hypoxic respiratory failure 5 L oxygen  at baseline, COPD, hypertension, hyperlipidemia, iron deficiency anemia, and GERD history of recurrent hospital admission for COPD exacerbation presented to emergency department via EMS for evaluation for respiratory distress. EMS stated Rizzolo Medrol , albuterol , sublingual nitroglycerin  as patient was complaining about left-sided chest pressure.  Presentation to ED patient placed on BiPAP maintaining O2 sat 100%. Hemodynamically stable.  Afebrile.     Chest x-ray showing emphysema.  Dilated pulmonary arteries compatible with pulmonary arterial hypertension. No acute cardiopulmonary disease.   Echo showed   Left ventricular ejection fraction, by estimation, is 60 to 65%. The left ventricle has normal function. The left ventricle has no regional wall motion abnormalities.  Right ventricular systolic function is normal. The right ventricular size is normal.  Patient was managed with bronchodilators, LABA/ICS, azithromycin .  She required BiPAP at nighttime mostly remained on 3 to 5 L of nasal cannula.  Did complain of right shoulder pain and imaging showed degenerative changes in the [facility] and glenohumeral joints with joint space narrowing and spurring. No acute fracture, no malalignment.  Overall she  has clinically improved,  shortness of breath has resolved.  She tells me that she would like to be discharged today.  She is currently on her baseline oxygen  requirement of 5 L.  She is medically stable for discharge    Assessment and Plan: COPD with acute exacerbation Chronic hypoxic respiratory failure  with hypoxia Respiratory distress requiring BiPAP - Patient presented with shortness of breath wearing BiPAP in the emergency department.  Recently she has been weaned down to 5 L of oxygen  which is her baseline. --CXR shows  Emphysema, Dilated central pulmonary arteries compatible with pulmonary arterial, hypertension.No acute cardiopulmonary disease. --Continue DuoNebs, LABA/ICS, prednisone  taper at home --Clinically improved as of this morning. - Continue supportive care. -Educated and encourage patient to use Trellegy Ellipta on daily basis instead of as needed. --Follow-up with primary care physician   Right shoulder pain -Right shoulder x-ray showed degenerative changes in the [facility] and glenohumeral joints with joint space narrowing and spurring.  No acute fracture, no malalignment. Heating pad as needed     Hyponatremia-resolved Patient presented with sodium of 128, status post IV hydration with improvement to 140.   History of GERD Continue Protonix    Essential hypertension -Presently normotensive.  At home patient is not any blood pressure regimen.   DVT prophylaxis:  Lovenox  Code Status:  Full Code Diet: Heart healthy diet        Consultants: None Procedures performed: None Disposition: Home Diet recommendation:  Cardiac diet DISCHARGE MEDICATION: Allergies as of 04/23/2024       Reactions   Acetaminophen  Shortness Of Breath   Amoxicillin  Shortness Of Breath   Ezetimibe  Other (See Comments)   Worsened the patient's COPD   Ibuprofen  Shortness Of Breath   Statins Other (See Comments)  Worsened the patient's COPD        Medication List     TAKE these medications     albuterol  (2.5 MG/3ML) 0.083% nebulizer solution Commonly known as: PROVENTIL  INHALE 3 ML BY NEBULIZATION EVERY 6 HOURS AS NEEDED FOR WHEEZING OR SHORTNESS OF BREATH What changed: See the new instructions.   albuterol  108 (90 Base) MCG/ACT inhaler Commonly known as: VENTOLIN  HFA Inhale 2 puffs into the lungs every 6 (six) hours as needed for wheezing or shortness of breath. What changed: Another medication with the same name was changed. Make sure you understand how and when to take each.   budesonide  0.5 MG/2ML nebulizer solution Commonly known as: PULMICORT  Take 2 mLs (0.5 mg total) by nebulization 2 (two) times daily.   ferrous sulfate  325 (65 FE) MG tablet Take 1 tablet (325 mg total) by mouth daily with breakfast.   ipratropium-albuterol  0.5-2.5 (3) MG/3ML Soln Commonly known as: DUONEB Take 3 mLs by nebulization every 6 (six) hours. What changed:  when to take this reasons to take this   nitroGLYCERIN  0.4 MG SL tablet Commonly known as: NITROSTAT  Place 1 tablet (0.4 mg total) under the tongue every 5 (five) minutes as needed. What changed: reasons to take this   Nucala  100 MG/ML Soaj Generic drug: Mepolizumab  Inject 1 mL (100 mg total) into the skin every 28 (twenty-eight) days.   omeprazole  20 MG capsule Commonly known as: PRILOSEC TAKE 1 CAPSULE BY MOUTH EVERY DAY   OXYGEN  Inhale 5 L/min into the lungs daily as needed (for shortness of breath).   polyethylene glycol 17 g packet Commonly known as: MiraLax  Take 17 g by mouth daily. What changed:  when to take this reasons to take this   predniSONE  10 MG tablet Commonly known as: DELTASONE  Take 4 tablets (40 mg total) by mouth daily for 3 days, THEN 3 tablets (30 mg total) daily for 3 days, THEN 2 tablets (20 mg total) daily for 3 days, THEN 1 tablet (10 mg total) daily for 3 days. Start taking on: April 23, 2024 What changed: See the new instructions.   Repatha  SureClick 140 MG/ML Soaj Generic drug:  Evolocumab  Inject 140 mg into the skin every 14 (fourteen) days. What changed:  when to take this additional instructions   traZODone  50 MG tablet Commonly known as: DESYREL  Take 25-50 mg by mouth at bedtime as needed for sleep.   Trelegy Ellipta  200-62.5-25 MCG/ACT Aepb Generic drug: Fluticasone -Umeclidin-Vilant Inhale 1 puff into the lungs daily. What changed:  when to take this reasons to take this        Follow-up Information     [facility] Follow up.   Contact information: [address] JEWELL DELENA ODESSIA KATHEE [place] [address] [phone]                Discharge Exam: Filed Weights   04/21/24 1629  Weight: 69 kg   General  No acute Distress Eyes: PERRL, lids and conjunctivae normal ENMT: Mucous membranes are moist.   Neck: normal, supple, no masses, no thyromegaly Respiratory: clear to auscultation bilaterally, no wheezing, no crackles. Normal respiratory effort. No accessory muscle use.  Cardiovascular: Regular rate and rhythm, no murmurs / rubs / gallops Abdomen: Soft, nontender nondistended. Musculoskeletal: no clubbing / cyanosis. No joint deformity upper and lower extremities.  Skin: no rashes, lesions, ulcers. No induration Neurologic: Facial asymmetry, moving extremity spontaneously, speech fluent. Psychiatric: Normal judgment and insight. Alert and oriented x 3. Normal mood.  Condition at discharge: good  The results of significant diagnostics from this hospitalization (including imaging, microbiology, ancillary and laboratory) are listed below for reference.   Imaging Studies: ECHOCARDIOGRAM LIMITED Result Date: 04/22/2024    ECHOCARDIOGRAM LIMITED REPORT   Patient Name:   AYDA TANCREDI Date of Exam: 04/22/2024 Medical Rec #:  [id number]       Height:       62.0 in Accession #:    [id number]      Weight:       152.1 lb Date of Birth:  1960-11-21       BSA:          1.702 m Patient Age:    63 years        BP:           109/78 mmHg Patient Gender:  F               HR:           78 bpm. Exam Location:  Inpatient Procedure: Limited Echo, Cardiac Doppler and Color Doppler (Both Spectral and            Color Flow Doppler were utilized during procedure). Indications:    Pulmonary hypertension  History:        Patient has prior history of Echocardiogram examinations, most                 recent 09/30/2023. CAD, COPD; Risk Factors:Hypertension and                 Dyslipidemia.  Sonographer:    Philomena Daring Referring Phys: Genieve Ramaswamy E Claudie Brickhouse IMPRESSIONS  1. Limited echo for pulmonary hypertension  2. Left ventricular ejection fraction, by estimation, is 60 to 65%. Left ventricular ejection fraction by PLAX is 64 %. The left ventricle has normal function. The left ventricle has no regional wall motion abnormalities.  3. Right ventricular systolic function is normal. The right ventricular size is normal. Tricuspid regurgitation signal is inadequate for assessing PA pressure.  4. Aortic valve regurgitation is trivial.  5. The inferior vena cava is normal in size with greater than 50% respiratory variability, suggesting right atrial pressure of 3 mmHg. Comparison(s): Changes from prior study are noted. 09/30/2023: LVEF 55-60%, indequate TR signal to assess pulmonary pressure. FINDINGS  Left Ventricle: Left ventricular ejection fraction, by estimation, is 60 to 65%. Left ventricular ejection fraction by PLAX is 64 %. The left ventricle has normal function. The left ventricle has no regional wall motion abnormalities. The left ventricular internal cavity size was normal in size. There is no left ventricular hypertrophy. Right Ventricle: The right ventricular size is normal. No increase in right ventricular wall thickness. Right ventricular systolic function is normal. Tricuspid regurgitation signal is inadequate for assessing PA pressure. Tricuspid Valve: The tricuspid valve is grossly normal. Tricuspid valve regurgitation is trivial. Aortic Valve: Aortic valve regurgitation is  trivial. Venous: The inferior vena cava is normal in size with greater than 50% respiratory variability, suggesting right atrial pressure of 3 mmHg. Additional Comments: Spectral Doppler performed. Color Doppler performed.  LEFT VENTRICLE PLAX 2D LV EF:         Left            Diastology                ventricular     LV e' medial:    8.05 cm/s  ejection        LV E/e' medial:  9.4                fraction by     LV e' lateral:   12.10 cm/s                PLAX is 64      LV E/e' lateral: 6.2                %. LVIDd:         5.10 cm LVIDs:         3.30 cm LV PW:         0.90 cm LV IVS:        1.10 cm LVOT diam:     2.00 cm LV SV:         74 LV SV Index:   43 LVOT Area:     3.14 cm  RIGHT VENTRICLE             IVC RV S prime:     12.60 cm/s  IVC diam: 1.80 cm TAPSE (M-mode): 2.0 cm LEFT ATRIUM         Index LA diam:    3.40 cm 2.00 cm/m  AORTIC VALVE LVOT Vmax:   122.00 cm/s LVOT Vmean:  82.300 cm/s LVOT VTI:    0.234 m  AORTA Ao Root diam: 2.70 cm MITRAL VALVE MV Area (PHT): 4.83 cm    SHUNTS MV Decel Time: 157 msec    Systemic VTI:  0.23 m MV E velocity: 75.50 cm/s  Systemic Diam: 2.00 cm MV A velocity: 80.70 cm/s MV E/A ratio:  0.94 Vinie Maxcy MD Electronically signed by Vinie Maxcy MD Signature Date/Time: 04/22/2024/2:06:00 PM    Final    DG Shoulder Right Result Date: 04/21/2024 EXAM: 1 VIEW(S) XRAY OF THE RIGHT SHOULDER 04/21/2024 12:15:57 AM COMPARISON: 12/13/2003. CLINICAL HISTORY: right shoulder pain FINDINGS: BONES AND JOINTS: Degenerative changes in the [facility] and glenohumeral joints with joint space narrowing and spurring. No acute fracture. No malalignment. SOFT TISSUES: No abnormal calcifications. Visualized lung is unremarkable. IMPRESSION: 1. No acute osseous abnormality identified. Electronically signed by: Franky Crease MD 04/21/2024 12:20 AM EST RP Workstation: [id number]   DG Chest Portable 1 View Result Date: 04/20/2024 EXAM: 1 AP VIEW XRAY OF THE CHEST 04/20/2024 11:09:00 PM  COMPARISON: 03/25/2024 CLINICAL HISTORY: sob sob FINDINGS: LUNGS AND PLEURA: Emphysema. Scarring in the right lung base. No pleural effusion. No pneumothorax. HEART AND MEDIASTINUM: Dilated central pulmonary arteries compatible with pulmonary arterial hypertension. BONES AND SOFT TISSUES: No acute osseous abnormality. IMPRESSION: 1. Emphysema. 2. Dilated central pulmonary arteries compatible with pulmonary arterial hypertension. 3. No acute cardiopulmonary disease. Electronically signed by: Franky Crease MD 04/20/2024 11:15 PM EST RP Workstation: [id number]   CT ABDOMEN PELVIS W CONTRAST Result Date: 04/08/2024 EXAM: CT ABDOMEN AND PELVIS WITH CONTRAST 04/08/2024 07:10:39 PM TECHNIQUE: CT of the abdomen and pelvis was performed with the administration of 100 mL iohexol  (OMNIPAQUE ) 350 MG/ML injection. Multiplanar reformatted images are provided for review. Automated exposure control, iterative reconstruction, and/or weight-based adjustment of the mA/kV was utilized to reduce the radiation dose to as low as reasonably achievable. COMPARISON: 05/20/2022 CLINICAL HISTORY: RLQ abdominal pain; pain and tenderness RLQ. FINDINGS: LOWER CHEST: See chest CT report today. LIVER: The liver is unremarkable. GALLBLADDER AND BILE DUCTS: Gallbladder is unremarkable. No biliary ductal dilatation. SPLEEN: No acute abnormality. PANCREAS: No acute abnormality. ADRENAL GLANDS: No acute abnormality. KIDNEYS, URETERS  AND BLADDER: 11 mm cyst in the upper pole of the right kidney appears simple. Per consensus, no follow-up is needed for simple Bosniak type 1 and 2 renal cysts, unless the patient has a malignancy history or risk factors. No stones in the kidneys or ureters. No hydronephrosis. No perinephric or periureteral stranding. Urinary bladder is unremarkable. GI AND BOWEL: Stomach demonstrates no acute abnormality. Colonic diverticulosis, most pronounced in the left colon. There is no bowel obstruction. PERITONEUM AND  RETROPERITONEUM: No ascites. No free air. VASCULATURE: Aorta is normal in caliber. Aortic atherosclerosis. LYMPH NODES: No lymphadenopathy. REPRODUCTIVE ORGANS: Pessary noted in the vagina. 4.4 cm fibroid within the uterus centrally. No adnexal mass. BONES AND SOFT TISSUES: No acute osseous abnormality. No focal soft tissue abnormality. IMPRESSION: 1. No acute findings in the abdomen or pelvis. 2. Uncomplicated colonic diverticulosis without evidence of acute diverticulitis. 3. Aortic atherosclerosis. Electronically signed by: Franky Crease MD 04/08/2024 07:55 PM EST RP Workstation: [id number]   CT Angio Chest PE W and/or Wo Contrast Result Date: 04/08/2024 EXAM: CTA of the Chest with contrast for PE 04/08/2024 07:10:39 PM TECHNIQUE: CTA of the chest was performed without and with the administration of 100 mL of iohexol  (OMNIPAQUE ) 350 MG/ML injection. Multiplanar reformatted images are provided for review. MIP images are provided for review. Automated exposure control, iterative reconstruction, and/or weight based adjustment of the mA/kV was utilized to reduce the radiation dose to as low as reasonably achievable. COMPARISON: 11/10/2023 CLINICAL HISTORY: Pulmonary embolism (PE) suspected, high prob. FINDINGS: PULMONARY ARTERIES: Pulmonary arteries are adequately opacified for evaluation. No pulmonary embolism. Main pulmonary artery is normal in caliber. MEDIASTINUM: The heart and pericardium demonstrate no acute abnormality. Scattered coronary artery and aortic atherosclerosis. There is no acute abnormality of the thoracic aorta. LYMPH NODES: No mediastinal, hilar or axillary lymphadenopathy. LUNGS AND PLEURA: Airway thickening in the lower lobes and right middle lobe with mucous plugging and associated right basilar opacities, favor atelectasis. No pleural effusion or pneumothorax. UPPER ABDOMEN: Limited images of the upper abdomen are unremarkable. SOFT TISSUES AND BONES: No acute bone or soft tissue  abnormality. IMPRESSION: 1. No pulmonary embolism. 2. Airway thickening with mucous plugging and right basilar atelectasis. 3. Coronary artery disease, aortic atherosclerosis. Electronically signed by: Franky Crease MD 04/08/2024 07:50 PM EST RP Workstation: [id number]   DG Chest Port 1 View Result Date: 03/25/2024 CLINICAL DATA:  Shortness of breath. EXAM: PORTABLE CHEST 1 VIEW COMPARISON:  03/02/2024 and CT chest 11/10/2023. FINDINGS: Trachea is midline. Heart is enlarged. Enlarged pulmonary arteries. Mild bibasilar scarring. Minimal streaky density in the right lower lobe. No pleural fluid. IMPRESSION: 1. Minimal right basilar atelectasis. 2. Enlarged pulmonary arteries, indicative of pulmonary arterial hypertension. Electronically Signed   By: Newell Eke M.D.   On: 03/25/2024 18:05    Microbiology: Results for orders placed or performed during the hospital encounter of 04/20/24  Respiratory (~20 pathogens) panel by PCR     Status: None   Collection Time: 04/21/24  3:31 AM   Specimen: Nasopharyngeal Swab; Respiratory  Result Value Ref Range Status   Adenovirus NOT DETECTED NOT DETECTED Final   Coronavirus 229E NOT DETECTED NOT DETECTED Final    Comment: (NOTE) The Coronavirus on the Respiratory Panel, DOES NOT test for the novel  Coronavirus (2019 nCoV)    Coronavirus HKU1 NOT DETECTED NOT DETECTED Final   Coronavirus NL63 NOT DETECTED NOT DETECTED Final   Coronavirus OC43 NOT DETECTED NOT DETECTED Final   Metapneumovirus NOT DETECTED NOT DETECTED Final  Rhinovirus / Enterovirus NOT DETECTED NOT DETECTED Final   Influenza A NOT DETECTED NOT DETECTED Final   Influenza B NOT DETECTED NOT DETECTED Final   Parainfluenza Virus 1 NOT DETECTED NOT DETECTED Final   Parainfluenza Virus 2 NOT DETECTED NOT DETECTED Final   Parainfluenza Virus 3 NOT DETECTED NOT DETECTED Final   Parainfluenza Virus 4 NOT DETECTED NOT DETECTED Final   Respiratory Syncytial Virus NOT DETECTED NOT DETECTED  Final   Bordetella pertussis NOT DETECTED NOT DETECTED Final   Bordetella Parapertussis NOT DETECTED NOT DETECTED Final   Chlamydophila pneumoniae NOT DETECTED NOT DETECTED Final   Mycoplasma pneumoniae NOT DETECTED NOT DETECTED Final    Comment: Performed at [facility], [address], [place], [place] [address]  MRSA Next Gen by PCR, Nasal     Status: None   Collection Time: 04/21/24  4:34 PM   Specimen: Nasal Mucosa; Nasal Swab  Result Value Ref Range Status   MRSA by PCR Next Gen NOT DETECTED NOT DETECTED Final    Comment: (NOTE) The GeneXpert MRSA Assay (FDA approved for NASAL specimens only), is one component of a comprehensive MRSA colonization surveillance program. It is not intended to diagnose MRSA infection nor to guide or monitor treatment for MRSA infections. Test performance is not FDA approved in patients less than 45 years old. Performed at [facility], [address], [place], [place] [address]     Labs: CBC: Recent Labs  Lab 04/20/24 2301 04/20/24 2307 04/21/24 0600  WBC 8.6  --  7.0  NEUTROABS 5.4  --   --   HGB 9.9* 11.2*  10.9* 10.0*  HCT 32.4* 33.0*  32.0* 32.7*  MCV 86.4  --  86.5  PLT 243  --  227   Basic Metabolic Panel: Recent Labs  Lab 04/20/24 2301 04/20/24 2307 04/21/24 2255 04/22/24 0759  NA 128* 129*  128* 136 140  K 3.8 3.8  3.8 4.8 4.3  CL 92* 89* 100 97*  CO2 30  --  29 34*  GLUCOSE 99 99 112* 84  BUN 7* 6* 11 9  CREATININE 0.61 0.70 0.78 0.64  CALCIUM 7.8*  --  8.5* 8.9  MG 1.8  --   --   --    Liver Function Tests: Recent Labs  Lab 04/20/24 2301  AST 24  ALT 15  ALKPHOS 40  BILITOT 0.9  PROT 6.0*  ALBUMIN 3.5   CBG: No results for input(s): GLUCAP in the last 168 hours.  Discharge time spent: greater than 30 minutes.  Signed: Landon FORBES Baller, MD [facility] 04/23/2024

## 2024-04-30 ENCOUNTER — Encounter: Payer: Self-pay | Admitting: Pulmonary Disease

## 2024-04-30 ENCOUNTER — Telehealth: Payer: Self-pay | Admitting: Cardiology

## 2024-04-30 ENCOUNTER — Ambulatory Visit (INDEPENDENT_AMBULATORY_CARE_PROVIDER_SITE_OTHER): Admitting: Pulmonary Disease

## 2024-04-30 ENCOUNTER — Ambulatory Visit: Admitting: Adult Health

## 2024-04-30 VITALS — BP 120/74 | HR 103 | Temp 98.3°F | Ht 62.0 in | Wt 151.0 lb

## 2024-04-30 DIAGNOSIS — J9611 Chronic respiratory failure with hypoxia: Secondary | ICD-10-CM

## 2024-04-30 DIAGNOSIS — G478 Other sleep disorders: Secondary | ICD-10-CM

## 2024-04-30 DIAGNOSIS — J449 Chronic obstructive pulmonary disease, unspecified: Secondary | ICD-10-CM

## 2024-04-30 DIAGNOSIS — J9690 Respiratory failure, unspecified, unspecified whether with hypoxia or hypercapnia: Secondary | ICD-10-CM | POA: Diagnosis not present

## 2024-04-30 DIAGNOSIS — Z87891 Personal history of nicotine dependence: Secondary | ICD-10-CM | POA: Diagnosis not present

## 2024-04-30 MED ORDER — METHYLPREDNISOLONE ACETATE 80 MG/ML IJ SUSP
120.0000 mg | Freq: Once | INTRAMUSCULAR | Status: AC
  Administered 2024-04-30: 17:00:00 120 mg via INTRAMUSCULAR

## 2024-04-30 MED ORDER — AZITHROMYCIN 250 MG PO TABS: 250.0000 mg | ORAL_TABLET | ORAL | 0 refills | Status: DC

## 2024-04-30 MED ORDER — PREDNISONE 10 MG PO TABS: 20.0000 mg | ORAL_TABLET | Freq: Every day | ORAL | 0 refills | Status: AC

## 2024-04-30 NOTE — Telephone Encounter (Signed)
 Pt requesting a c/b in regards to insurance forms she dropped off.

## 2024-04-30 NOTE — Telephone Encounter (Signed)
 Advised that Dr. Revankar has the forms to complete. Once completed pt advised if possible please fax.

## 2024-04-30 NOTE — Procedures (Signed)
 [facility] [address] [place], [place] [address] Tel: [phone]   Fax: [phone]  Polysomnography Interpretation  Patient Name:  [name], [name] Date:  04/16/2024 Referring Physician:  LONNA CODER [phone]) %%startinterp%% Indications for Polysomnography The patient is a 63 year old Female who is 5' 2 and weighs 160.0 lbs. Her BMI equals 29.4.  A full night polysomnogram was performed to evaluate for nocturnal dyspnea, snoring.  Bedtime Medications  Albuterol  Nebulizer  Budesonide  Nebulizer   Polysomnogram Data A full night polysomnogram recorded the standard physiologic parameters including EEG, EOG, EMG, EKG, nasal and oral airflow.  Respiratory parameters of chest and abdominal movements were recorded with Respiratory Inductance Plethysmography belts.  Oxygen  saturation was recorded by pulse oximetry.   Sleep Architecture The total recording time of the polysomnogram was 424.3 minutes.  The total sleep time was 326.5 minutes.  The patient spent 2.9% of total sleep time in Stage N1, 71.1% in Stage N2, 0.0% in Stages N3, and 26.0% in REM.  Sleep latency was 23.8 minutes.  REM latency was 61.5 minutes.  Sleep Efficiency was 76.9%.  Wake after Sleep Onset time was 74.0 minutes.  Respiratory Events The polysomnogram revealed a presence of 1 obstructive, - central, and - mixed apneas resulting in an Apnea index of 0.2 events per hour.  There were - hypopneas (>=3% desaturation and/or arousal) resulting in an Apnea\Hypopnea Index (AHI >=3% desaturation and/or arousal) of 0.2 events per hour.  There were - hypopneas (>=4% desaturation) resulting in an Apnea\Hypopnea Index (AHI >=4% desaturation) of 0.2 events per hour.  There were 45 Respiratory Effort Related Arousals resulting in a RERA index of 8.3 events per hour. The Respiratory Disturbance Index is 8.5 events per hour.  The snore index was - events per hour.  Mean oxygen  saturation  was 99.0%.  The lowest oxygen  saturation during sleep was 98.0%.  Time spent <=88% oxygen  saturation was 0.7 minutes (0.2%).  Limb Activity There were - total limb movements recorded, of this total, - were classified as PLMs.  PLM index was - per hour and PLM associated with Arousals index was - per hour.  Cardiac Summary The average pulse rate was 71.8 bpm.  The minimum pulse rate was 61.0 bpm while the maximum pulse rate was 103.0 bpm.  Cardiac rhythm was normal/abnormal.  Comments: Few RERAs but AHI was not significant . Study was performed on 5L [place] & no significant desaturations noted during sleep . Some movements noted right after REM sleep. Corelate clinically for symptoms s/o REM behavior disorder  Diagnosis : Upper airway resistance syndrome Chronic respiratory failure with hypoxia  Recommendations: Consider lowering oxygen  during sleep CPAP is not indicated for this degree of slepe disordered breathing   This study was personally reviewed and electronically signed by: Dr. Harden Staff Accredited Board Certified in Sleep Medicine

## 2024-04-30 NOTE — Patient Instructions (Addendum)
°  VISIT SUMMARY: Today, we addressed your severe COPD with recurrent exacerbations and chronic respiratory failure. We discussed your current medications, recent hospitalizations, and concerns about your prognosis.  YOUR PLAN: SEVERE COPD WITH RECURRENT EXACERBATIONS AND CHRONIC RESPIRATORY FAILURE: You have severe COPD with frequent flare-ups and ongoing breathing difficulties, especially at night. -You received a prednisone  injection today and will start taking oral prednisone  20 mg daily. -We checked with the pharmacy to ensure your Nucala  prescription is received. -You are prescribed azithromycin  250 mg to take three times a week (Monday, Wednesday, Friday). -You are referred to pulmonary rehabilitation to help manage your symptoms. -We are working on getting you a Trilogy vent machine to help with your breathing at night. -Continue using Breztri  as your preferred inhaler.

## 2024-04-30 NOTE — Procedures (Addendum)
°  Indications for Polysomnography The patient is a 63 year old Female who is 5' 2 and weighs 160.0 lbs. Her BMI equals 29.4.  A full night polysomnogram was performed to evaluate for -.  Bedtime MedicationsAlbuterol NebulizerBudesonide Nebulizer Polysomnogram Data A full night polysomnogram recorded the standard physiologic parameters including EEG, EOG, EMG, EKG, nasal and oral airflow.  Respiratory parameters of chest and abdominal movements were recorded with Respiratory Inductance Plethysmography belts.   Oxygen  saturation was recorded by pulse oximetry.  Sleep Architecture The total recording time of the polysomnogram was 424.3 minutes.  The total sleep time was 326.5 minutes.  The patient spent 2.9% of total sleep time in Stage N1, 71.1% in Stage N2, 0.0% in Stages N3, and 26.0% in REM.  Sleep latency was 23.8 minutes.   REM latency was 61.5 minutes.  Sleep Efficiency was 76.9%.  Wake after Sleep Onset time was 74.0 minutes.  Respiratory Events The polysomnogram revealed a presence of 1 obstructive, - central, and - mixed apneas resulting in an Apnea index of 0.2 events per hour.  There were - hypopneas (GreaterEqual to3% desaturation and/or arousal) resulting in an Apnea\Hypopnea Index (AHI  GreaterEqual to3% desaturation and/or arousal) of 0.2 events per hour.  There were - hypopneas (GreaterEqual to4% desaturation) resulting in an Apnea\Hypopnea Index (AHI GreaterEqual to4% desaturation) of 0.2 events per hour.  There were 45 Respiratory  Effort Related Arousals resulting in a RERA index of 8.3 events per hour. The Respiratory Disturbance Index is 8.5 events per hour.  The snore index was - events per hour.  Mean oxygen  saturation was 99.0%.  The lowest oxygen  saturation during sleep was 98.0%.  Time spent LessEqual to88% oxygen  saturation was  minutes ().  Limb Activity There were - total limb movements recorded, of this total, - were classified as PLMs.  PLM index was - per hour and  PLM associated with Arousals index was - per hour.  Cardiac Summary The average pulse rate was 71.8 bpm.  The minimum pulse rate was 61.0 bpm while the maximum pulse rate was 103.0 bpm.  Cardiac rhythm was normal/abnormal.  Comments:  Diagnosis:  Recommendations:   This study was personally reviewed and electronically signed by: Dr. Harden Staff Accredited Board Certified in Sleep Medicine Date/Time:

## 2024-04-30 NOTE — Progress Notes (Unsigned)
 Amber Williard    [id number]    Jul 23, 1960  Primary Care Physician:Vanstory, Rosina [name], PA  Referring Physician: Rosalea Rosina [name], PA [address] [place],  [place] [address]  Chief complaint:  Follow-up for  Severe COPD with recurrent hospitalization  HPI: 63 y.o. smoker with very severe COPD, chronic hypoxic respiratory failure.  Continues to have chronic cough, dyspnea on exertion, intermittent hemoptysis with mucus Has recurrent hospitalizations for acute on chronic respiratory failure secondary to COPD exacerbations Discontinued Breztri  due to adverse effects and cost. We had tried multiple controller medications in the past and is currently on Trelegy.  Also on 20 mg of prednisone .  Attempts to reduce the prednisone  dose have been unsuccessful.  Chronic azithromycin  caused gastrointestinal discomfort, limiting its use. Daliresp  induced anxiety and insomnia.  Ohtuvayre  was prescribed but she could not continue due to the cost associated with it  Interim history: Discussed the use of AI scribe software for clinical note transcription with the patient, who gave verbal consent to proceed.  History of Present Illness  Wheezing and copd exacerbations - Wheezing present - Three to four COPD exacerbation hospitalizations since October - Completed a prednisone  taper yesterday  Inhaler and biologic therapy - Started Nucala  in October, used for two months - Last Nucala  dose on November 5; missed December dose - No clear benefit from Nucala  so far - Switched from Trelegy to Breztri  during hospitalization - Prefers Breztri , uses it twice daily - Previously overused Breztri  as a rescue inhaler  Fatigue and functional status - Fatigue present - Stopped working due to symptoms - Able to attend pulmonary rehabilitation  Oxygen  therapy - On continuous oxygen  via concentrator at 5 L  Nocturnal hypoxemia and sleep disturbance - Home sleep study completed a few weeks  ago - Insufficient air at night, especially when sleeping on her side - Wakes up feeling unwel  Relevant pulmonary history Pets: No pets, birds, farm animals Occupation: 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  Currently works in a it sales professional pumps.  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 Exposures: No mold, hot tub, Jacuzzi. Smoking history: 35-pack-year smoker.  Quit in 2011 Travel history: No significant travel history Relevant family history: No significant family history of lung disease.  Outpatient Encounter Medications as of 04/30/2024  Medication Sig   albuterol  (VENTOLIN  HFA) 108 (90 Base) MCG/ACT inhaler Inhale 2 puffs into the lungs every 6 (six) hours as needed for wheezing or shortness of breath.   Evolocumab  (REPATHA  SURECLICK) 140 MG/ML SOAJ Inject 140 mg into the skin every 14 (fourteen) days.   ferrous sulfate  325 (65 FE) MG tablet Take 1 tablet (325 mg total) by mouth daily with breakfast.   Fluticasone -Umeclidin-Vilant (TRELEGY ELLIPTA ) 200-62.5-25 MCG/ACT AEPB Inhale 1 puff into the lungs daily.   ipratropium-albuterol  (DUONEB) 0.5-2.5 (3) MG/3ML SOLN Take 3 mLs by nebulization every 6 (six) hours.   nitroGLYCERIN  (NITROSTAT ) 0.4 MG SL tablet Place 1 tablet (0.4 mg total) under the tongue every 5 (five) minutes as needed.   omeprazole  (PRILOSEC) 20 MG capsule TAKE 1 CAPSULE BY MOUTH EVERY DAY   OXYGEN  Inhale 5 L/min into the lungs daily as needed (for shortness of breath).   albuterol  (PROVENTIL ) (2.5 MG/3ML) 0.083% nebulizer solution INHALE 3 ML BY NEBULIZATION EVERY 6 HOURS AS NEEDED FOR WHEEZING OR SHORTNESS OF BREATH (Patient not taking: Reported on 04/30/2024)   budesonide  (PULMICORT ) 0.5 MG/2ML nebulizer solution Take 2  mLs (0.5 mg total) by nebulization 2 (two) times daily. (Patient not taking: Reported on 04/30/2024)   Mepolizumab  (NUCALA ) 100 MG/ML SOAJ Inject 1 mL (100 mg total) into the skin every 28 (twenty-eight) days.  (Patient not taking: Reported on 04/30/2024)   polyethylene glycol (MIRALAX ) 17 g packet Take 17 g by mouth daily. (Patient not taking: Reported on 04/30/2024)   predniSONE  (DELTASONE ) 10 MG tablet Take 4 tablets (40 mg total) by mouth daily for 3 days, THEN 3 tablets (30 mg total) daily for 3 days, THEN 2 tablets (20 mg total) daily for 3 days, THEN 1 tablet (10 mg total) daily for 3 days. (Patient not taking: No sig reported)   traZODone  (DESYREL ) 50 MG tablet Take 25-50 mg by mouth at bedtime as needed for sleep. (Patient not taking: Reported on 04/30/2024)   No facility-administered encounter medications on file as of 04/30/2024.   Vitals:   04/30/24 1549  BP: 120/74  Pulse: (!) 103  Temp: 98.3 F (36.8 C)  Height: 5' 2 (1.575 m)  Weight: 151 lb (68.5 kg)  SpO2: 99% Comment: 6L Tank  TempSrc: Oral  BMI (Calculated): 27.61     Physical Exam VITALS: P- 103 GEN: No acute distress. CV: Regular rate and rhythm, no murmurs. LUNGS: Wheezing heard on auscultation. SKIN JOINTS: Warm and dry, no rash.   Data Reviewed: Imaging: Low-dose screening CT 04/19/2021-new areas of bilateral subpleural groundglass consolidation in the lower lobes, coronary artery calcification, enlarged PA, emphysema.  Screening CT 07/15/21 - new large consolidation right middle and right lower lobe  CT 11/01/2021-irregular focus of consolidation in the right middle lobe  CT chest 05/26/2022-small consolidation in the medial right middle lobe unchanged since 09/28/2022, emphysema, chronic bronchitis  CT high-resolution 06/22/2023-shifting consolidation with scattered mucoid impaction, emphysema  CT chest 11/18/2023-no acute findings.  Chronic bronchiectasis and scarring in the right middle lobe, emphysema.  Chest x-ray 02/02/2024-streaky bibasal opacities, central vascular congestion I have reviewed the images personally.  PFT 06/02/17 FVC 1.50 [60%], FEV1 0.79 [40%], F/F 53, TLC 96, RV/TLC 167%, DLCO 32% Severe  obstruction with bronchodilator response, air-trapping.  Severe diffusion impairment.  11/10/15:  Walked 360 meters / Baseline Sat 96% on RA / Nadir Sat 95% on RA  MICROBIOLOGY Sputum Ctx (11/13/15):  Paecilomyces species / Oral Flora / AFB negative    LABS 03/01/15 HIV:  Negative   09/29/14 ANA:  Negative   05/16/11 Alpha-1 antitrypsin: MM (122)  Cardiac work-up by Dr. Ladona Nuclear stress test 11/14/16-very small inferoseptal ischemia, preserved LVEF with mild hypokinesis the same region Echocardiogram shows mild pulmonary hypertension with normal LV systolic function.  Echocardiogram 09/30/2023-LVEF 55-60%, RV size mildly enlarged with mild systolic function.  Tricuspid regurgitation is inadequate for assessing pulmonary hypertension.  TAPSE 2.2 cm Assessment & Plan Severe chronic obstructive pulmonary disease with recurrent exacerbations and chronic respiratory failure Severe COPD with recurrent exacerbations and chronic respiratory failure. Multiple hospitalizations since October.Previous trials of azithromycin , Daliresp , and Ohtuvayre  were either ineffective or unaffordable. Nucala  injection approved as an alternative to Dupixent  to reduce lung inflammation and prevent exacerbations.   Currently experiencing wheezing and difficulty breathing, especially at night. Recent sleep study showed no sleep apnea, but recurrent hypercarbic respiratory failure noted. Heart rate elevated at 103 bpm, likely due to exacerbation. Currently on Breztri , which she prefers over Trelegy. Nucala  prescription not received, which may help prevent hospitalizations. Home vent considered for COPD management to prevent hospitalizations and improve nighttime breathing.  Patient continues to exhibit  signs of hypercapnia associated with chronic respiratory failure secondary to COPD.  Interruption or failure to provide NIV would quickly lead to exacerbation of the patient's condition, hospital admission, and  likely harm the patient.  Continued use is preferred.  The use of the NIV will treat patient's high PCO2 levels and can reduce risk of exacerbations and future hospitalizations when used at night and during the day.  BiLevel/RAD has been considered and ruled out as patient requires continuous alarms, backup rate, battery, and portability which are not possible with BiLevel/RAD devices.  Ventilation is required to decrease the work of breathing and improve pulmonary status. Interruption of ventilator support would lead to decline of health status.  Patient is able to protect their airways and clear secretions on their own.   - Administered prednisone  injection today and started oral prednisone  20 mg daily. - Checked with pharmacy to ensure Nucala  prescription is received. - Prescribed azithromycin  250 mg three times a week (Monday, Wednesday, Friday). - Referred to pulmonary rehabilitation. - Attempted to obtain Trilogy vent for COPD management. - Continue Breztri  as preferred inhaler.     Health maintenance Will need referral to lung cancer screening next year 09/03/2011- Pneumovax Up-to-date with RSV and COVID.  Does not want to get flu vaccine due to previous side effects  Plan/Recommendations: Continue Trelegy inhaler and prednisone  at 20 mg/day Start Bactrim  for prophylaxis Pulmonary rehab Nucala  start In lab sleep study  I spent a total of 120 minutes in the care of the patient today including preparing to see the patient, getting/reviewing separately obtained history, performing a medically appropriate exam/evaluation, counseling and educating, placing orders, documenting clinical information in the EHR, independently interpreting results, coordinating care, and supervising pharmacist training for nucala  innjections.   Oreoluwa Gilmer MD [facility] 04/30/2024, 3:56 PM  CC: Rosalea Rosina [name], [place]

## 2024-05-01 ENCOUNTER — Telehealth (HOSPITAL_COMMUNITY): Payer: Self-pay

## 2024-05-01 NOTE — Telephone Encounter (Signed)
 Called patient to see if she was interested in Pulmonary Rehab.  Patient stated that she was interested.

## 2024-05-02 ENCOUNTER — Telehealth: Payer: Self-pay | Admitting: Pulmonary Disease

## 2024-05-02 NOTE — Telephone Encounter (Signed)
 Dr. Theophilus, please see below message and advise.

## 2024-05-02 NOTE — Telephone Encounter (Signed)
 [name] with [employer] said; Hello,  order has been received, However, We need the order updated to show the numerical setting for the pap unit to process this request.  Thank you,  [name] Tami

## 2024-05-03 ENCOUNTER — Other Ambulatory Visit: Payer: Self-pay

## 2024-05-03 NOTE — Telephone Encounter (Signed)
 Called- [facility], he said a recall was placed 3 years ago for trilogy ventilators. He will fax a form over for Dr Theophilus to sign ( Awaiting fax).

## 2024-05-03 NOTE — Progress Notes (Unsigned)
 Specialty Pharmacy Refill Coordination Note  [name] is a 63 y.o. female contacted today regarding refills of specialty medication(s) Mepolizumab  (Nucala )   Patient requested Delivery   Delivery date: 05/07/24   Verified address: [address]  [place] [address]   Medication will be filled on: 05/06/24

## 2024-05-06 NOTE — Telephone Encounter (Signed)
 Emailed to patient to fill sign. CB

## 2024-05-10 ENCOUNTER — Ambulatory Visit: Admission: EM | Admit: 2024-05-10 | Discharge: 2024-05-10 | Disposition: A | Discharge status: Home / Self Care

## 2024-05-10 ENCOUNTER — Encounter: Payer: Self-pay | Admitting: Emergency Medicine

## 2024-05-10 DIAGNOSIS — J441 Chronic obstructive pulmonary disease with (acute) exacerbation: Secondary | ICD-10-CM

## 2024-05-10 MED ORDER — METHYLPREDNISOLONE SODIUM SUCC 125 MG IJ SOLR
60.0000 mg | Freq: Once | INTRAMUSCULAR | Status: AC
  Administered 2024-05-10: 60 mg via INTRAMUSCULAR

## 2024-05-10 MED ORDER — IPRATROPIUM-ALBUTEROL 0.5-2.5 (3) MG/3ML IN SOLN
3.0000 mL | Freq: Once | RESPIRATORY_TRACT | Status: AC
  Administered 2024-05-10: 3 mL via RESPIRATORY_TRACT

## 2024-05-10 MED ORDER — IPRATROPIUM-ALBUTEROL 0.5-2.5 (3) MG/3ML IN SOLN: 3.0000 mL | RESPIRATORY_TRACT | 0 refills | Status: AC | PRN

## 2024-05-10 MED ORDER — METHYLPREDNISOLONE 4 MG PO TBPK: ORAL_TABLET | ORAL | 0 refills | Status: DC

## 2024-05-10 NOTE — Discharge Instructions (Addendum)
 Take duoneb instead of albuterol , and Medrol  Dosepak instead of prednisone  10 until symptoms improve.

## 2024-05-10 NOTE — ED Triage Notes (Signed)
 Pt reports constant wheezing that began this morning. Hx of COPD and heart blockage per pt. Pt on 5L at baseline. Pulse ox in triage 97-99% (switched over to [facility] oxygen  to save personal supply). Expiratory wheezing noted in triage. Tachypnea. Used albuterol  neb treatments, breztri  inhaler, and albuterol  rescue inhaler with no relief.
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
## 2024-05-27 ENCOUNTER — Other Ambulatory Visit: Payer: Self-pay

## 2024-05-27 ENCOUNTER — Other Ambulatory Visit (HOSPITAL_COMMUNITY): Payer: Self-pay

## 2024-05-27 ENCOUNTER — Telehealth: Payer: Self-pay

## 2024-05-27 DIAGNOSIS — J449 Chronic obstructive pulmonary disease, unspecified: Secondary | ICD-10-CM

## 2024-05-27 MED ORDER — NUCALA 100 MG/ML ~~LOC~~ SOAJ
100.0000 mg | SUBCUTANEOUS | 5 refills | Status: AC
  Filled 2024-05-27 – 2024-05-28 (×3): qty 1, 28d supply, fill #0

## 2024-05-27 NOTE — Progress Notes (Signed)
 Nucala  re-ordered - erroneously discontinued off med list.  Per Dr. Jiles last note in Dec 2025, continue Nucala .

## 2024-05-27 NOTE — Telephone Encounter (Signed)
 Submitted a Prior Authorization request to [employer] for NUCALA  via CoverMyMeds. Authorization has been APPROVED from 05/27/24 to 11/24/24. Approval letter sent to scan center.   Patient can continue to fill through [facility]: [phone]   Authorization#: [id number]

## 2024-05-27 NOTE — Progress Notes (Signed)
 PA approved until 11/24/24

## 2024-05-28 ENCOUNTER — Other Ambulatory Visit: Payer: Self-pay

## 2024-05-28 ENCOUNTER — Telehealth (HOSPITAL_COMMUNITY): Payer: Self-pay

## 2024-05-28 NOTE — Telephone Encounter (Signed)
 Pt insurance is active and benefits verified through [facility]. Co-pay $15, DED $0/$0 met, out of pocket $4,200/$0 met, co-insurance 0%. No pre-authorization required. 05/28/24 @ 2:05pm, spoke with Olena, REF# [id number].  Unable to verify [place] benefits, have tried calling 2x.

## 2024-05-28 NOTE — Telephone Encounter (Signed)
 Attempted to call patient to schedule pulmonary rehab- no answer, left message. Sent MyChart message.

## 2024-05-30 ENCOUNTER — Other Ambulatory Visit: Payer: Self-pay

## 2024-05-30 ENCOUNTER — Other Ambulatory Visit (HOSPITAL_COMMUNITY): Payer: Self-pay

## 2024-05-30 ENCOUNTER — Telehealth: Payer: Self-pay

## 2024-05-30 NOTE — Telephone Encounter (Signed)
 Received message from our specialty pharmacy that patient has stopped Nucala  due to side effects.   Will reach out to patient to see if we can help resolve issue.

## 2024-06-04 ENCOUNTER — Other Ambulatory Visit (HOSPITAL_COMMUNITY): Payer: Self-pay

## 2024-06-04 ENCOUNTER — Other Ambulatory Visit: Payer: Self-pay

## 2024-06-05 ENCOUNTER — Other Ambulatory Visit (HOSPITAL_COMMUNITY): Payer: Self-pay

## 2024-06-07 ENCOUNTER — Other Ambulatory Visit: Payer: Self-pay | Admitting: Pulmonary Disease

## 2024-06-07 NOTE — Telephone Encounter (Signed)
 She reports having diarrhea for a few days after injection, along with headache, and feels it has thrown off her breathing. Feels it is ineffective for COPD since she has still been going to the hospital. Chart review shows several hospitalizations before starting Nucala  and pattern continued after starting Nucala .   She reports last shot was in November. It was mailed to her in December, but she could not use it due to intolerable side effects. She has not changed her other medications and continued to use her maintenance inhaler regimen.   She likely received 3 doses of Nucala  and was unable to continue treatment due to intolerable side effects. Next OV 07/01/24 with Landry Ferrari. Will route to Landry Ferrari, NP, and Dr. Theophilus for awareness.

## 2024-06-08 ENCOUNTER — Other Ambulatory Visit: Payer: Self-pay | Admitting: Pulmonary Disease

## 2024-06-08 ENCOUNTER — Other Ambulatory Visit: Payer: Self-pay

## 2024-06-08 ENCOUNTER — Emergency Department (HOSPITAL_COMMUNITY)

## 2024-06-08 ENCOUNTER — Encounter (HOSPITAL_COMMUNITY): Payer: Self-pay | Admitting: *Deleted

## 2024-06-08 ENCOUNTER — Inpatient Hospital Stay (HOSPITAL_COMMUNITY)
Admission: EM | Admit: 2024-06-08 | Discharge: 2024-06-11 | DRG: 189 | Disposition: A | Attending: Family Medicine | Admitting: Family Medicine

## 2024-06-08 DIAGNOSIS — G4700 Insomnia, unspecified: Secondary | ICD-10-CM | POA: Diagnosis present

## 2024-06-08 DIAGNOSIS — Z1152 Encounter for screening for COVID-19: Secondary | ICD-10-CM

## 2024-06-08 DIAGNOSIS — J439 Emphysema, unspecified: Secondary | ICD-10-CM | POA: Diagnosis present

## 2024-06-08 DIAGNOSIS — J9620 Acute and chronic respiratory failure, unspecified whether with hypoxia or hypercapnia: Principal | ICD-10-CM

## 2024-06-08 DIAGNOSIS — I10 Essential (primary) hypertension: Secondary | ICD-10-CM | POA: Diagnosis present

## 2024-06-08 DIAGNOSIS — J9622 Acute and chronic respiratory failure with hypercapnia: Secondary | ICD-10-CM | POA: Diagnosis present

## 2024-06-08 DIAGNOSIS — K219 Gastro-esophageal reflux disease without esophagitis: Secondary | ICD-10-CM | POA: Diagnosis present

## 2024-06-08 DIAGNOSIS — Z888 Allergy status to other drugs, medicaments and biological substances status: Secondary | ICD-10-CM

## 2024-06-08 DIAGNOSIS — H9190 Unspecified hearing loss, unspecified ear: Secondary | ICD-10-CM | POA: Diagnosis present

## 2024-06-08 DIAGNOSIS — R0600 Dyspnea, unspecified: Secondary | ICD-10-CM

## 2024-06-08 DIAGNOSIS — Z880 Allergy status to penicillin: Secondary | ICD-10-CM

## 2024-06-08 DIAGNOSIS — Z8249 Family history of ischemic heart disease and other diseases of the circulatory system: Secondary | ICD-10-CM

## 2024-06-08 DIAGNOSIS — E785 Hyperlipidemia, unspecified: Secondary | ICD-10-CM | POA: Diagnosis present

## 2024-06-08 DIAGNOSIS — Z7951 Long term (current) use of inhaled steroids: Secondary | ICD-10-CM

## 2024-06-08 DIAGNOSIS — E876 Hypokalemia: Secondary | ICD-10-CM | POA: Diagnosis present

## 2024-06-08 DIAGNOSIS — Z8719 Personal history of other diseases of the digestive system: Secondary | ICD-10-CM

## 2024-06-08 DIAGNOSIS — Z87891 Personal history of nicotine dependence: Secondary | ICD-10-CM

## 2024-06-08 DIAGNOSIS — Z825 Family history of asthma and other chronic lower respiratory diseases: Secondary | ICD-10-CM

## 2024-06-08 DIAGNOSIS — J9621 Acute and chronic respiratory failure with hypoxia: Principal | ICD-10-CM | POA: Diagnosis present

## 2024-06-08 DIAGNOSIS — Z8616 Personal history of COVID-19: Secondary | ICD-10-CM

## 2024-06-08 DIAGNOSIS — J441 Chronic obstructive pulmonary disease with (acute) exacerbation: Secondary | ICD-10-CM | POA: Diagnosis present

## 2024-06-08 DIAGNOSIS — I2510 Atherosclerotic heart disease of native coronary artery without angina pectoris: Secondary | ICD-10-CM | POA: Diagnosis present

## 2024-06-08 DIAGNOSIS — Z79899 Other long term (current) drug therapy: Secondary | ICD-10-CM

## 2024-06-08 LAB — BLOOD GAS, ARTERIAL
Acid-Base Excess: 3.9 mmol/L — ABNORMAL HIGH (ref 0.0–2.0)
Bicarbonate: 30.2 mmol/L — ABNORMAL HIGH (ref 20.0–28.0)
Drawn by: 331471
Expiratory PAP: 5 cmH2O
FIO2: 60 %
Inspiratory PAP: 10 cmH2O
Mode: POSITIVE
O2 Saturation: 100 %
Patient temperature: 37
pCO2 arterial: 51 mmHg — ABNORMAL HIGH (ref 32–48)
pH, Arterial: 7.38 (ref 7.35–7.45)
pO2, Arterial: 243 mmHg — ABNORMAL HIGH (ref 83–108)

## 2024-06-08 LAB — CBC WITH DIFFERENTIAL/PLATELET
Abs Immature Granulocytes: 0.02 10*3/uL (ref 0.00–0.07)
Basophils Absolute: 0 10*3/uL (ref 0.0–0.1)
Basophils Relative: 0 %
Eosinophils Absolute: 0 10*3/uL (ref 0.0–0.5)
Eosinophils Relative: 0 %
HCT: 34.2 % — ABNORMAL LOW (ref 36.0–46.0)
Hemoglobin: 10.5 g/dL — ABNORMAL LOW (ref 12.0–15.0)
Immature Granulocytes: 0 %
Lymphocytes Relative: 6 %
Lymphs Abs: 0.4 10*3/uL — ABNORMAL LOW (ref 0.7–4.0)
MCH: 27.6 pg (ref 26.0–34.0)
MCHC: 30.7 g/dL (ref 30.0–36.0)
MCV: 90 fL (ref 80.0–100.0)
Monocytes Absolute: 0.1 10*3/uL (ref 0.1–1.0)
Monocytes Relative: 2 %
Neutro Abs: 5.7 10*3/uL (ref 1.7–7.7)
Neutrophils Relative %: 92 %
Platelets: 245 10*3/uL (ref 150–400)
RBC: 3.8 MIL/uL — ABNORMAL LOW (ref 3.87–5.11)
RDW: 15.3 % (ref 11.5–15.5)
WBC: 6.2 10*3/uL (ref 4.0–10.5)
nRBC: 0 % (ref 0.0–0.2)

## 2024-06-08 LAB — COMPREHENSIVE METABOLIC PANEL WITH GFR
ALT: 13 U/L (ref 0–44)
AST: 18 U/L (ref 15–41)
Albumin: 3.3 g/dL — ABNORMAL LOW (ref 3.5–5.0)
Alkaline Phosphatase: 47 U/L (ref 38–126)
Anion gap: 9 (ref 5–15)
BUN: 6 mg/dL — ABNORMAL LOW (ref 8–23)
CO2: 23 mmol/L (ref 22–32)
Calcium: 6.9 mg/dL — ABNORMAL LOW (ref 8.9–10.3)
Chloride: 110 mmol/L (ref 98–111)
Creatinine, Ser: 0.36 mg/dL — ABNORMAL LOW (ref 0.44–1.00)
GFR, Estimated: >60 mL/min
Glucose, Bld: 126 mg/dL — ABNORMAL HIGH (ref 70–99)
Potassium: 3.4 mmol/L — ABNORMAL LOW (ref 3.5–5.1)
Sodium: 142 mmol/L (ref 135–145)
Total Bilirubin: <0.2 mg/dL (ref 0.0–1.2)
Total Protein: 5.3 g/dL — ABNORMAL LOW (ref 6.5–8.1)

## 2024-06-08 LAB — PRO BRAIN NATRIURETIC PEPTIDE: Pro Brain Natriuretic Peptide: <50 pg/mL

## 2024-06-08 LAB — RESP PANEL BY RT-PCR (RSV, FLU A&B, COVID)  RVPGX2
Influenza A by PCR: NEGATIVE
Influenza B by PCR: NEGATIVE
Resp Syncytial Virus by PCR: NEGATIVE
SARS Coronavirus 2 by RT PCR: NEGATIVE

## 2024-06-08 LAB — TROPONIN T, HIGH SENSITIVITY: Troponin T High Sensitivity: 7 ng/L (ref 0–19)

## 2024-06-08 MED ORDER — IPRATROPIUM-ALBUTEROL 0.5-2.5 (3) MG/3ML IN SOLN
3.0000 mL | RESPIRATORY_TRACT | Status: DC
  Administered 2024-06-09: 3 mL via RESPIRATORY_TRACT
  Filled 2024-06-08: qty 3

## 2024-06-08 MED ORDER — ALBUTEROL SULFATE (2.5 MG/3ML) 0.083% IN NEBU
10.0000 mg/h | INHALATION_SOLUTION | Freq: Once | RESPIRATORY_TRACT | Status: AC
  Administered 2024-06-08: 10 mg/h via RESPIRATORY_TRACT
  Filled 2024-06-08: qty 3

## 2024-06-08 MED ORDER — LIDOCAINE 5 % EX PTCH
1.0000 | MEDICATED_PATCH | CUTANEOUS | Status: DC
  Administered 2024-06-08 – 2024-06-10 (×3): 1 via TRANSDERMAL
  Filled 2024-06-08 (×3): qty 1

## 2024-06-08 MED ORDER — SENNOSIDES-DOCUSATE SODIUM 8.6-50 MG PO TABS: 1.0000 | ORAL_TABLET | Freq: Every evening | ORAL | Status: DC | PRN

## 2024-06-08 MED ORDER — HYDROXYZINE HCL 25 MG PO TABS
25.0000 mg | ORAL_TABLET | Freq: Three times a day (TID) | ORAL | Status: DC | PRN
  Administered 2024-06-08: 25 mg via ORAL
  Filled 2024-06-08: qty 1

## 2024-06-08 MED ORDER — POTASSIUM CHLORIDE CRYS ER 20 MEQ PO TBCR
40.0000 meq | EXTENDED_RELEASE_TABLET | Freq: Once | ORAL | Status: AC
  Administered 2024-06-08: 40 meq via ORAL
  Filled 2024-06-08: qty 2

## 2024-06-08 MED ORDER — ONDANSETRON HCL 4 MG PO TABS: 4.0000 mg | ORAL_TABLET | Freq: Four times a day (QID) | ORAL | Status: DC | PRN

## 2024-06-08 MED ORDER — ONDANSETRON HCL 4 MG/2ML IJ SOLN: 4.0000 mg | Freq: Four times a day (QID) | INTRAMUSCULAR | Status: DC | PRN

## 2024-06-08 MED ORDER — BISACODYL 5 MG PO TBEC: 5.0000 mg | DELAYED_RELEASE_TABLET | Freq: Every day | ORAL | Status: DC | PRN

## 2024-06-08 MED ORDER — IPRATROPIUM-ALBUTEROL 0.5-2.5 (3) MG/3ML IN SOLN
3.0000 mL | Freq: Once | RESPIRATORY_TRACT | Status: AC
  Administered 2024-06-08: 3 mL via RESPIRATORY_TRACT
  Filled 2024-06-08: qty 3

## 2024-06-08 MED ORDER — ALBUTEROL SULFATE (2.5 MG/3ML) 0.083% IN NEBU
2.5000 mg | INHALATION_SOLUTION | Freq: Once | RESPIRATORY_TRACT | Status: AC
  Administered 2024-06-08: 2.5 mg via RESPIRATORY_TRACT
  Filled 2024-06-08: qty 3

## 2024-06-08 MED ORDER — KETOROLAC TROMETHAMINE 15 MG/ML IJ SOLN
15.0000 mg | Freq: Once | INTRAMUSCULAR | Status: AC
  Administered 2024-06-08: 15 mg via INTRAVENOUS
  Filled 2024-06-08: qty 1

## 2024-06-08 MED ORDER — ALBUTEROL SULFATE (2.5 MG/3ML) 0.083% IN NEBU
INHALATION_SOLUTION | RESPIRATORY_TRACT | Status: AC
  Filled 2024-06-08: qty 9

## 2024-06-08 MED ORDER — ENOXAPARIN SODIUM 40 MG/0.4ML IJ SOSY
40.0000 mg | PREFILLED_SYRINGE | INTRAMUSCULAR | Status: DC
  Administered 2024-06-08 – 2024-06-10 (×3): 40 mg via SUBCUTANEOUS
  Filled 2024-06-08 (×4): qty 0.4

## 2024-06-08 MED ORDER — FENTANYL CITRATE (PF) 50 MCG/ML IJ SOSY
50.0000 ug | PREFILLED_SYRINGE | Freq: Once | INTRAMUSCULAR | Status: AC
  Administered 2024-06-08: 50 ug via INTRAVENOUS
  Filled 2024-06-08: qty 1

## 2024-06-08 NOTE — ED Provider Notes (Signed)
 " [facility] AT [facility] Provider Note   CSN: [id number] Arrival date & time: 06/08/24  1810     Patient presents with: Shortness of Breath   [name] is a 64 y.o. female.   HPI 64 year old female with a history of COPD presents for sudden shortness of breath.  History is from patient and also the nurse who spoke to EMS.  She has been having a cough for couple days and her family member has been sick but tested negative for illnesses such as the flu.  The patient has not had a fever or chest pain.  Had all of a sudden shortness of breath this evening and EMS was called.  They put her on BiPAP due to her degree of respiratory distress.  Gave 10 mg albuterol , 125 mg Solu-Medrol , 2 g IV magnesium , and placed her on CPAP.  She reports improvement but is still short of breath.  No leg swelling.  Prior to Admission medications  Medication Sig Start Date End Date Taking? Authorizing Provider  albuterol  (PROVENTIL ) (2.5 MG/3ML) 0.083% nebulizer solution INHALE 3 ML BY NEBULIZATION EVERY 6 HOURS AS NEEDED FOR WHEEZING OR SHORTNESS OF BREATH 10/02/23   Mannam, Praveen, MD  albuterol  (VENTOLIN  HFA) 108 (90 Base) MCG/ACT inhaler Inhale 2 puffs into the lungs every 6 (six) hours as needed for wheezing or shortness of breath. 04/09/24   [place], Camellia PARAS, DO  azithromycin  (ZITHROMAX ) 250 MG tablet Take 1 tablet (250 mg total) by mouth 3 (three) times a week. 05/01/24   Mannam, Praveen, MD  budesonide -glycopyrrolate -formoterol  (BREZTRI  AEROSPHERE) 160-9-4.8 MCG/ACT AERO inhaler Inhale 2 puffs into the lungs 2 (two) times daily.    [provider]  Evolocumab  (REPATHA  SURECLICK) 140 MG/ML SOAJ Inject 140 mg into the skin every 14 (fourteen) days. 01/23/24   Revankar, Jennifer SAUNDERS, MD  ferrous sulfate  325 (65 FE) MG tablet Take 1 tablet (325 mg total) by mouth daily with breakfast. 03/05/24   Franchot Novel, MD  ipratropium-albuterol  (DUONEB) 0.5-2.5 (3) MG/3ML SOLN  Take 3 mLs by nebulization every 4 (four) hours as needed. 05/10/24   Andra Corean BROCKS, PA-C  Mepolizumab  (NUCALA ) 100 MG/ML SOAJ Inject 1 mL (100 mg total) into the skin every 28 (twenty-eight) days. 05/27/24   Mannam, Praveen, MD  methylPREDNISolone  (MEDROL  DOSEPAK) 4 MG TBPK tablet Take as directed on the back of package 05/10/24   Andra Corean BROCKS, PA-C  nitroGLYCERIN  (NITROSTAT ) 0.4 MG SL tablet Place 1 tablet (0.4 mg total) under the tongue every 5 (five) minutes as needed. 03/04/24 06/02/24  Franchot Novel, MD  omeprazole  (PRILOSEC) 20 MG capsule TAKE 1 CAPSULE BY MOUTH EVERY DAY 06/07/24   Mannam, Praveen, MD  OXYGEN  Inhale 5 L/min into the lungs daily as needed (for shortness of breath).    [provider]  sulfamethoxazole -trimethoprim  (BACTRIM  DS) 800-160 MG tablet Take 1 tablet by mouth 3 (three) times a week. Patient not taking: Reported on 05/10/2024 05/08/24   [provider]  Vitamin D, Ergocalciferol, (DRISDOL) 1.25 MG (50000 UNIT) CAPS capsule 1 cap(s) orally once a week; Duration: 30 days 05/02/24   [provider]    Allergies: Acetaminophen , Amoxicillin , Ezetimibe , Ibuprofen , and Statins    Review of Systems  Constitutional:  Negative for fever.  Respiratory:  Positive for cough and shortness of breath.   Cardiovascular:  Negative for chest pain and leg swelling.    Updated Vital Signs BP 114/81   Pulse 66   Temp (!) 97.5  F (36.4 C) (Oral)   Resp (!) 21   Ht 5' 2 (1.575 m)   Wt 68.5 kg   SpO2 100%   BMI 27.62 kg/m   Physical Exam Vitals and nursing note reviewed.  Constitutional:      Appearance: She is well-developed.     Comments: On BiPAP  HENT:     Head: Normocephalic and atraumatic.  Cardiovascular:     Rate and Rhythm: Normal rate and regular rhythm.     Heart sounds: Normal heart sounds.  Pulmonary:     Effort: Tachypnea present.     Breath sounds: Wheezing present.  Abdominal:     Palpations: Abdomen is  soft.     Tenderness: There is no abdominal tenderness.  Musculoskeletal:     Right lower leg: No edema.     Left lower leg: No edema.  Skin:    General: Skin is warm and dry.  Neurological:     Mental Status: She is alert.     (all labs ordered are listed, but only abnormal results are displayed) Labs Reviewed  CBC WITH DIFFERENTIAL/PLATELET - Abnormal; Notable for the following components:      Result Value   RBC 3.80 (*)    Hemoglobin 10.5 (*)    HCT 34.2 (*)    Lymphs Abs 0.4 (*)    All other components within normal limits  BLOOD GAS, ARTERIAL - Abnormal; Notable for the following components:   pCO2 arterial 51 (*)    pO2, Arterial 243 (*)    Bicarbonate 30.2 (*)    Acid-Base Excess 3.9 (*)    All other components within normal limits  COMPREHENSIVE METABOLIC PANEL WITH GFR - Abnormal; Notable for the following components:   Potassium 3.4 (*)    Glucose, Bld 126 (*)    BUN 6 (*)    Creatinine, Ser 0.36 (*)    Calcium 6.9 (*)    Total Protein 5.3 (*)    Albumin 3.3 (*)    All other components within normal limits  RESP PANEL BY RT-PCR (RSV, FLU A&B, COVID)  RVPGX2  PRO BRAIN NATRIURETIC PEPTIDE  TROPONIN T, HIGH SENSITIVITY    EKG: EKG Interpretation Date/Time:  Saturday June 08 2024 18:20:53 EST Ventricular Rate:  91 PR Interval:  142 QRS Duration:  92 QT Interval:  374 QTC Calculation: 461 R Axis:   89  Text Interpretation: Sinus rhythm Borderline right axis deviation Nonspecific T abnrm, anterolateral leads similar to Dec 2025 Confirmed by Freddi Hamilton [phone]) on 06/08/2024 10:11:52 PM  Radiology: DG Chest Portable 1 View Result Date: 06/08/2024 EXAM: 1 VIEW(S) XRAY OF THE CHEST 06/08/2024 06:51:00 PM COMPARISON: 04/20/2024 CLINICAL HISTORY: Shortness of breath. FINDINGS: LUNGS AND PLEURA: Stable linear scarring of right mid lung zone. No pleural effusion. No pneumothorax. HEART AND MEDIASTINUM: No acute abnormality of the cardiac and mediastinal  silhouettes. BONES AND SOFT TISSUES: No acute osseous abnormality. IMPRESSION: 1. No acute findings. 2. Stable linear scarring of the right mid lung zone. Electronically signed by: Oneil Devonshire MD 06/08/2024 06:55 PM EST RP Workstation: [id number]     .Critical Care  Performed by: Freddi Hamilton, MD Authorized by: Freddi Hamilton, MD   Critical care provider statement:    Critical care time (minutes):  40   Critical care time was exclusive of:  Separately billable procedures and treating other patients   Critical care was necessary to treat or prevent imminent or life-threatening deterioration of the following conditions:  Respiratory failure  Critical care was time spent personally by me on the following activities:  Development of treatment plan with patient or surrogate, discussions with consultants, evaluation of patient's response to treatment, examination of patient, ordering and review of laboratory studies, ordering and review of radiographic studies, ordering and performing treatments and interventions, pulse oximetry, re-evaluation of patient's condition and review of old charts    Medications Ordered in the ED  albuterol  (PROVENTIL ) (2.5 MG/3ML) 0.083% nebulizer solution (  Not Given 06/08/24 2053)  ipratropium-albuterol  (DUONEB) 0.5-2.5 (3) MG/3ML nebulizer solution 3 mL (3 mLs Nebulization Given 06/08/24 1906)  albuterol  (PROVENTIL ) (2.5 MG/3ML) 0.083% nebulizer solution 2.5 mg (2.5 mg Nebulization Given 06/08/24 1907)  albuterol  (PROVENTIL ) (2.5 MG/3ML) 0.083% nebulizer solution (10 mg/hr Nebulization Given 06/08/24 2046)  fentaNYL  (SUBLIMAZE ) injection 50 mcg (50 mcg Intravenous Given 06/08/24 2007)                                    Medical Decision Making Amount and/or Complexity of Data Reviewed External Data Reviewed: notes. Labs: ordered.    Details: Normal WBC Radiology: ordered and independent interpretation performed.    Details: No lobar pneumonia ECG/medicine  tests: ordered and independent interpretation performed.    Details: Stable compared to baseline  Risk Prescription drug management. Decision regarding hospitalization.   Patient presents with respiratory distress.  Doing better on BiPAP.  Put on nebs and eventually a continuous nebulizer which has improved her breathing.  She was already given Solu-Medrol  and magnesium  with EMS.  No pneumonia noted.  Seems to be a recurrent COPD exacerbation.  No signs of MI or CHF.  At this point, she is doing better and I think stable for hospitalist admission.  Discussed with Dr. Lou.     Final diagnoses:  Acute on chronic respiratory failure, unspecified whether with hypoxia or hypercapnia (HCC)  COPD exacerbation [facility])    ED Discharge Orders     None          Freddi Hamilton, MD 06/08/24 2214  "

## 2024-06-08 NOTE — ED Triage Notes (Signed)
 Pt via ems, from home.  Copd home o2 exacerbation in her car.  Gasping for air on arrival. Initially cpap. 20 G LAC 125 mg solumedrol 2 gm Mag 2 duo nebs Initially decreased and wheezy.  Improving.   A and O x 4 Bp 150/ Hr 120, now 90 ST 99 on cpap Cbg 124
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
## 2024-06-08 NOTE — Progress Notes (Signed)
" °   06/08/24 2051  BiPAP/CPAP/SIPAP  BiPAP/CPAP/SIPAP Pt Type Adult  BiPAP/CPAP/SIPAP V60  Mask Type Full face mask  Dentures removed? Not applicable  Mask Size Small  Set Rate 16 breaths/min  Respiratory Rate 23 breaths/min  IPAP 10 cmH20  EPAP 5 cmH2O  FiO2 (%) 60 %  Minute Ventilation 8.9  Leak 0  Peak Inspiratory Pressure (PIP) 10  Tidal Volume (Vt) 471  Patient Home Machine No  Patient Home Mask No  Patient Home Tubing No  Auto Titrate No  Press High Alarm 45 cmH2O  Device Plugged into RED Power Outlet Yes  BiPAP/CPAP /SiPAP Vitals  Pulse Rate 66  Resp (!) 21  SpO2 100 %  MEWS Score/Color  MEWS Score 1  MEWS Score Color Green    "

## 2024-06-08 NOTE — Progress Notes (Signed)
" °   06/08/24 2239  BiPAP/CPAP/SIPAP  BiPAP/CPAP/SIPAP Pt Type Adult (trial off Bipap)  BiPAP/CPAP /SiPAP Vitals  Pulse Rate 78  Resp (!) 22  SpO2 97 %  MEWS Score/Color  MEWS Score 1  MEWS Score Color Green    "

## 2024-06-09 DIAGNOSIS — R0600 Dyspnea, unspecified: Secondary | ICD-10-CM

## 2024-06-09 DIAGNOSIS — J441 Chronic obstructive pulmonary disease with (acute) exacerbation: Secondary | ICD-10-CM

## 2024-06-09 LAB — CBC
HCT: 33.8 % — ABNORMAL LOW (ref 36.0–46.0)
Hemoglobin: 10.4 g/dL — ABNORMAL LOW (ref 12.0–15.0)
MCH: 27.7 pg (ref 26.0–34.0)
MCHC: 30.8 g/dL (ref 30.0–36.0)
MCV: 89.9 fL (ref 80.0–100.0)
Platelets: 243 10*3/uL (ref 150–400)
RBC: 3.76 MIL/uL — ABNORMAL LOW (ref 3.87–5.11)
RDW: 15.5 % (ref 11.5–15.5)
WBC: 6.3 10*3/uL (ref 4.0–10.5)
nRBC: 0 % (ref 0.0–0.2)

## 2024-06-09 LAB — BASIC METABOLIC PANEL WITH GFR
Anion gap: 7 (ref 5–15)
BUN: 8 mg/dL (ref 8–23)
CO2: 30 mmol/L (ref 22–32)
Calcium: 9.3 mg/dL (ref 8.9–10.3)
Chloride: 102 mmol/L (ref 98–111)
Creatinine, Ser: 0.59 mg/dL (ref 0.44–1.00)
GFR, Estimated: >60 mL/min
Glucose, Bld: 129 mg/dL — ABNORMAL HIGH (ref 70–99)
Potassium: 4.7 mmol/L (ref 3.5–5.1)
Sodium: 139 mmol/L (ref 135–145)

## 2024-06-09 LAB — RESPIRATORY PANEL BY PCR

## 2024-06-09 LAB — BLOOD GAS, VENOUS
Acid-Base Excess: 8.3 mmol/L — ABNORMAL HIGH (ref 0.0–2.0)
Bicarbonate: 36.1 mmol/L — ABNORMAL HIGH (ref 20.0–28.0)
O2 Saturation: 32.5 %
Patient temperature: 36.4
pCO2, Ven: 65 mmHg — ABNORMAL HIGH (ref 44–60)
pH, Ven: 7.35 (ref 7.25–7.43)
pO2, Ven: <31 mmHg — CL (ref 32–45)

## 2024-06-09 LAB — MAGNESIUM: Magnesium: 2.6 mg/dL — ABNORMAL HIGH (ref 1.7–2.4)

## 2024-06-09 MED ORDER — OXYCODONE HCL 5 MG PO TABS
5.0000 mg | ORAL_TABLET | Freq: Four times a day (QID) | ORAL | Status: DC | PRN
  Administered 2024-06-09: 5 mg via ORAL
  Filled 2024-06-09: qty 1

## 2024-06-09 MED ORDER — BUDESONIDE 0.25 MG/2ML IN SUSP: 0.2500 mg | Freq: Two times a day (BID) | RESPIRATORY_TRACT | Status: DC

## 2024-06-09 MED ORDER — ARFORMOTEROL TARTRATE 15 MCG/2ML IN NEBU: 15.0000 ug | INHALATION_SOLUTION | Freq: Two times a day (BID) | RESPIRATORY_TRACT | Status: DC

## 2024-06-09 MED ORDER — METHYLPREDNISOLONE SODIUM SUCC 125 MG IJ SOLR
80.0000 mg | Freq: Every day | INTRAMUSCULAR | Status: DC
  Administered 2024-06-09 – 2024-06-10 (×2): 80 mg via INTRAVENOUS
  Filled 2024-06-09 (×2): qty 2

## 2024-06-09 MED ORDER — FERROUS SULFATE 325 (65 FE) MG PO TABS
325.0000 mg | ORAL_TABLET | ORAL | Status: DC
  Administered 2024-06-11: 325 mg via ORAL
  Filled 2024-06-09: qty 1

## 2024-06-09 MED ORDER — AZITHROMYCIN 500 MG PO TABS
500.0000 mg | ORAL_TABLET | Freq: Every day | ORAL | Status: AC
  Administered 2024-06-09 – 2024-06-11 (×3): 500 mg via ORAL
  Filled 2024-06-09 (×3): qty 2

## 2024-06-09 MED ORDER — PANTOPRAZOLE SODIUM 40 MG PO TBEC
40.0000 mg | DELAYED_RELEASE_TABLET | Freq: Every day | ORAL | Status: DC
  Administered 2024-06-09 – 2024-06-11 (×3): 40 mg via ORAL
  Filled 2024-06-09 (×3): qty 1

## 2024-06-09 MED ORDER — IPRATROPIUM-ALBUTEROL 0.5-2.5 (3) MG/3ML IN SOLN
3.0000 mL | RESPIRATORY_TRACT | Status: DC | PRN
  Filled 2024-06-09: qty 3

## 2024-06-09 MED ORDER — TRAZODONE HCL 50 MG PO TABS
50.0000 mg | ORAL_TABLET | Freq: Every day | ORAL | Status: DC
  Administered 2024-06-09 – 2024-06-10 (×2): 50 mg via ORAL
  Filled 2024-06-09 (×2): qty 1

## 2024-06-09 MED ORDER — IPRATROPIUM-ALBUTEROL 0.5-2.5 (3) MG/3ML IN SOLN
3.0000 mL | RESPIRATORY_TRACT | Status: DC
  Administered 2024-06-09: 3 mL via RESPIRATORY_TRACT
  Filled 2024-06-09: qty 3

## 2024-06-09 MED ORDER — FERROUS SULFATE 325 (65 FE) MG PO TABS
325.0000 mg | ORAL_TABLET | Freq: Every day | ORAL | Status: DC
  Administered 2024-06-09: 325 mg via ORAL
  Filled 2024-06-09: qty 1

## 2024-06-09 MED ORDER — IPRATROPIUM-ALBUTEROL 0.5-2.5 (3) MG/3ML IN SOLN
3.0000 mL | Freq: Four times a day (QID) | RESPIRATORY_TRACT | Status: DC
  Administered 2024-06-09 (×2): 3 mL via RESPIRATORY_TRACT
  Filled 2024-06-09 (×2): qty 3

## 2024-06-09 MED ORDER — ALBUTEROL SULFATE (2.5 MG/3ML) 0.083% IN NEBU
2.5000 mg | INHALATION_SOLUTION | RESPIRATORY_TRACT | Status: DC | PRN
  Administered 2024-06-09: 2.5 mg via RESPIRATORY_TRACT
  Filled 2024-06-09: qty 3

## 2024-06-09 MED ORDER — BUDESON-GLYCOPYRROL-FORMOTEROL 160-9-4.8 MCG/ACT IN AERO
2.0000 | INHALATION_SPRAY | Freq: Two times a day (BID) | RESPIRATORY_TRACT | Status: DC
  Administered 2024-06-09 – 2024-06-11 (×5): 2 via RESPIRATORY_TRACT
  Filled 2024-06-09: qty 5.9

## 2024-06-09 MED ORDER — REVEFENACIN 175 MCG/3ML IN SOLN: 175.0000 ug | Freq: Every day | RESPIRATORY_TRACT | Status: DC

## 2024-06-09 MED ORDER — IPRATROPIUM-ALBUTEROL 0.5-2.5 (3) MG/3ML IN SOLN
3.0000 mL | RESPIRATORY_TRACT | Status: DC
  Administered 2024-06-09 – 2024-06-11 (×11): 3 mL via RESPIRATORY_TRACT
  Filled 2024-06-09 (×11): qty 3

## 2024-06-09 NOTE — Telephone Encounter (Signed)
 Thanks

## 2024-06-09 NOTE — Progress Notes (Signed)
" °   06/09/24 0105  BiPAP/CPAP/SIPAP  $ Non-Invasive Home Ventilator  Initial  BiPAP/CPAP/SIPAP Pt Type Adult  BiPAP/CPAP/SIPAP Resmed  Mask Type Full face mask  Dentures removed? Not applicable  Mask Size Small  IPAP 12 cmH20  EPAP 5 cmH2O  Flow Rate 5 lpm  Patient Home Machine No  Patient Home Mask No  Patient Home Tubing No  Auto Titrate No  BiPAP/CPAP /SiPAP Vitals  Pulse Rate 77  Resp 16  SpO2 100 %  MEWS Score/Color  MEWS Score 0  MEWS Score Color Green    "

## 2024-06-09 NOTE — Progress Notes (Signed)
 " PROGRESS NOTE    [name]  FMW:[id number] DOB: [birth year] DOA: 06/08/2024 PCP: Rosalea Rosina [name], PA  Chief Complaint  Patient presents with   Shortness of Breath    Brief Narrative:   [name] is [name] 64 y.o. female with medical history significant for prediabetes, HTN, HLD, obesity, OSA, CAD, cardiomyopathy, severe COPD on NIV at night, chronic hypoxic respiratory failure on 5 L [place], hiatal hernia, and GERD who presented to the ED for evaluation of shortness of breath and admitted for COPD exacerbation.   Assessment & Plan:   Principal Problem:   COPD with acute exacerbation (HCC) Active Problems:   Nocturnal dyspnea   # Severe COPD with acute exacerbation # Acute on chronic hypoxic and hypercarbic respiratory failure - Patient with history of recurrent hospitalizations for COPD exacerbation presented with acute onset of shortness of breath, increased cough and wheezing.  On 5 L at baseline (though occasionally wears no oxygen ). - she's improved subjectively, but very poor air movement on exam, wheezing - CXR with no evidence of PNA; Neg covid, RSV and flu test - IV Solu-Medrol  80 mg daily for now, transition to PO when she starts to improve additionally - azithromycin  500 mg x 3 doses - Brovana , yulperi and Pulmicort  - As needed duonebs - IS, flutter valve   # Hypokalemia -resolved  # CAD # HLD - Continue Repatha  every 2 weeks in the outpatient   # Nocturnal dyspnea # Hypercarbic respiratory failure - BiPAP at bedtime   # GERD - Continue PPI   # Insomnia - Continue trazodone  at bedtime    DVT prophylaxis: lovenox  Code Status: full Family Communication: none Disposition:   Status is: Inpatient Remains inpatient appropriate because: need for continued inpatient care   Consultants:  none  Procedures:  none  Antimicrobials:  Anti-infectives (From admission, onward)    Start     Dose/Rate Route Frequency Ordered Stop   06/09/24 1000   azithromycin  (ZITHROMAX ) tablet 500 mg        500 mg Oral Daily 06/09/24 0121 06/12/24 0959       Subjective: Feels 70% today Feeling better  Objective: Vitals:   06/09/24 0105 06/09/24 0402 06/09/24 0437 06/09/24 0800  BP:   106/71   Pulse: 77  83   Resp: 16  (!) 22   Temp:   97.9 F (36.6 C) 98.2 F (36.8 C)  TempSrc:   Oral Oral  SpO2: 100% 100%    Weight:      Height:       No intake or output data in the 24 hours ending 06/09/24 0946 Filed Weights   06/08/24 1819  Weight: 68.5 kg    Examination:  General exam: chronically ill appearing Respiratory system: diminished, poor air movement, expiratory wheezing Cardiovascular system: RRR Central nervous system: Alert and oriented. No focal neurological deficits. Extremities: no LEE   Data Reviewed: I have personally reviewed following labs and imaging studies  CBC: Recent Labs  Lab 06/08/24 1906 06/09/24 0534  WBC 6.2 6.3  NEUTROABS 5.7  --   HGB 10.5* 10.4*  HCT 34.2* 33.8*  MCV 90.0 89.9  PLT 245 243    Basic Metabolic Panel: Recent Labs  Lab 06/08/24 2029 06/09/24 0534  NA 142 139  K 3.4* 4.7  CL 110 102  CO2 23 30  GLUCOSE 126* 129*  BUN 6* 8  CREATININE 0.36* 0.59  CALCIUM 6.9* 9.3  MG  --  2.6*    GFR: Estimated Creatinine  Clearance: 63.6 mL/min (by C-G formula based on SCr of 0.59 mg/dL).  Liver Function Tests: Recent Labs  Lab 06/08/24 2029  AST 18  ALT 13  ALKPHOS 47  BILITOT <0.2  PROT 5.3*  ALBUMIN 3.3*    CBG: No results for input(s): GLUCAP in the last 168 hours.   Recent Results (from the past 240 hours)  Resp panel by RT-PCR (RSV, Flu Wallice [place]&B, Covid) Anterior Nasal Swab     Status: None   Collection Time: 06/08/24  6:27 PM   Specimen: Anterior Nasal Swab  Result Value Ref Range Status   SARS Coronavirus 2 by RT PCR NEGATIVE NEGATIVE Final    Comment: (NOTE) SARS-CoV-2 target nucleic acids are NOT DETECTED.  The SARS-CoV-2 RNA is generally detectable in upper  respiratory specimens during the acute phase of infection. The lowest concentration of SARS-CoV-2 viral copies this assay can detect is 138 copies/mL. Sandrina Heaton negative result does not preclude SARS-Cov-2 infection and should not be used as the sole basis for treatment or other patient management decisions. Casimiro Lienhard negative result may occur with  improper specimen collection/handling, submission of specimen other than nasopharyngeal swab, presence of viral mutation(s) within the areas targeted by this assay, and inadequate number of viral copies(<138 copies/mL). Mikenna Bunkley negative result must be combined with clinical observations, patient history, and epidemiological information. The expected result is Negative.  Fact Sheet for Patients:  bloggercourse.com  Fact Sheet for Healthcare Providers:  seriousbroker.it  This test is no t yet approved or cleared by the [place]  FDA and  has been authorized for detection and/or diagnosis of SARS-CoV-2 by FDA under an Emergency Use Authorization (EUA). This EUA will remain  in effect (meaning this test can be used) for the duration of the COVID-19 declaration under Section 564(b)(1) of the Act, 21 U.S.C.section 360bbb-3(b)(1), unless the authorization is terminated  or revoked sooner.       Influenza Rakeem Colley by PCR NEGATIVE NEGATIVE Final   Influenza B by PCR NEGATIVE NEGATIVE Final    Comment: (NOTE) The Xpert Xpress SARS-CoV-2/FLU/RSV plus assay is intended as an aid in the diagnosis of influenza from Nasopharyngeal swab specimens and should not be used as Amiliana Foutz sole basis for treatment. Nasal washings and aspirates are unacceptable for Xpert Xpress SARS-CoV-2/FLU/RSV testing.  Fact Sheet for Patients: bloggercourse.com  Fact Sheet for Healthcare Providers: seriousbroker.it  This test is not yet approved or cleared by the [place]  FDA and has been  authorized for detection and/or diagnosis of SARS-CoV-2 by FDA under an Emergency Use Authorization (EUA). This EUA will remain in effect (meaning this test can be used) for the duration of the COVID-19 declaration under Section 564(b)(1) of the Act, 21 U.S.C. section 360bbb-3(b)(1), unless the authorization is terminated or revoked.     Resp Syncytial Virus by PCR NEGATIVE NEGATIVE Final    Comment: (NOTE) Fact Sheet for Patients: bloggercourse.com  Fact Sheet for Healthcare Providers: seriousbroker.it  This test is not yet approved or cleared by the [place]  FDA and has been authorized for detection and/or diagnosis of SARS-CoV-2 by FDA under an Emergency Use Authorization (EUA). This EUA will remain in effect (meaning this test can be used) for the duration of the COVID-19 declaration under Section 564(b)(1) of the Act, 21 U.S.C. section 360bbb-3(b)(1), unless the authorization is terminated or revoked.  Performed at [facility], [address] [place], [place] [address]          Radiology Studies: DG Chest Portable 1 View Result  Date: 06/08/2024 EXAM: 1 VIEW(S) XRAY OF THE CHEST 06/08/2024 06:51:00 PM COMPARISON: 04/20/2024 CLINICAL HISTORY: Shortness of breath. FINDINGS: LUNGS AND PLEURA: Stable linear scarring of right mid lung zone. No pleural effusion. No pneumothorax. HEART AND MEDIASTINUM: No acute abnormality of the cardiac and mediastinal silhouettes. BONES AND SOFT TISSUES: No acute osseous abnormality. IMPRESSION: 1. No acute findings. 2. Stable linear scarring of the right mid lung zone. Electronically signed by: Oneil Devonshire MD 06/08/2024 06:55 PM EST RP Workstation: [id number]        Scheduled Meds:  azithromycin   500 mg Oral Daily   budesonide -glycopyrrolate -formoterol   2 puff Inhalation BID   enoxaparin  (LOVENOX ) injection  40 mg Subcutaneous Q24H   ferrous sulfate   325 mg Oral Q  breakfast   ipratropium-albuterol   3 mL Nebulization Q4H   lidocaine   1 patch Transdermal Q24H   methylPREDNISolone  (SOLU-MEDROL ) injection  80 mg Intravenous Daily   pantoprazole   40 mg Oral Daily   traZODone   50 mg Oral QHS   Continuous Infusions:   LOS: 1 day    Time spent: over 30 min    Meliton Monte, MD [facility]   To contact the attending provider between 7A-7P or the covering provider during after hours 7P-7A, please log into the web site [email or web address] and access using universal [place] password for that web site. If you do not have the password, please call the hospital operator.  06/09/2024, 9:46 AM    "

## 2024-06-09 NOTE — Progress Notes (Signed)
" °   06/09/24 2053  BiPAP/CPAP/SIPAP  BiPAP/CPAP/SIPAP Pt Type Adult  BiPAP/CPAP/SIPAP Resmed  Mask Type Full face mask  Dentures removed? Not applicable  Respiratory Rate 20 breaths/min  IPAP 12 cmH20  EPAP 5 cmH2O  Flow Rate 5 lpm  Patient Home Machine No  Patient Home Mask No  Patient Home Tubing No  Auto Titrate No  Device Plugged into RED Power Outlet Yes  BiPAP/CPAP /SiPAP Vitals  Pulse Rate 72  Resp 19  SpO2 100 %  MEWS Score/Color  MEWS Score 0  MEWS Score Color Green    "

## 2024-06-09 NOTE — Plan of Care (Signed)
   Problem: Education: Goal: Knowledge of General Education information will improve Description: Including pain rating scale, medication(s)/side effects and non-pharmacologic comfort measures Outcome: Progressing   Problem: Coping: Goal: Level of anxiety will decrease Outcome: Progressing

## 2024-06-09 NOTE — Evaluation (Signed)
 Physical Therapy Brief Evaluation and Discharge Note Patient Details Name: [name] MRN: [id number] DOB: 1961-01-05 Today's Date: 06/09/2024   History of Present Illness  64 yo admit with COPD exacerbation. On home O2 4-5 liters baseline. Home independent driving, and cares for her 10 yo grandchild.  Clinical Impression  Pt mobilizing well, just limited due to 3/4 dyspnea during and after walking on 5-6Liters O2, with audible wheezing as well. Educated with pursed lip breathing , and not to turn up O2 unprescribed because her O2 sats are steady at 97-99%, but just great dyspnea.    PT to sign off, and continue ambulation with nursing staff please.    PT Assessment Patient does not need any further PT services  Assistance Needed at Discharge  PRN    Equipment Recommendations    Recommendations for Other Services       Precautions/Restrictions          Mobility  Bed Mobility Rolling: Modified independent (Device/Increase time) Supine/Sidelying to sit: Modified independent (Device/Increased time) Sit to supine/sidelying: Modified independent (Device/Increased time)    Transfers Overall transfer level: Modified independent Equipment used: Rolling walker (2 wheels)                    Ambulation/Gait Ambulation/Gait assistance: Modified independent (Device/Increase time) Gait Distance (Feet): 80 Feet Assistive device: Rolling walker (2 wheels) Gait Pattern/deviations: Step-through pattern Gait Speed: Pace WFL General Gait Details: standing rest break for breathing  Home Activity Instructions    Stairs            Modified Rankin (Stroke Patients Only)        Balance Overall balance assessment: No apparent balance deficits (not formally assessed)                        Pertinent Vitals/Pain PT - Brief Vital Signs All Vital Signs Stable: Yes (ambulated on 6 Liters of 02 [place], at 97-99% .explained to patinet that getting too much O2 can  be dangerous as well. Pt was 2/4 to 3/4 dysnpea after ambulating with audible wheezing as well.) Pain Assessment Pain Assessment: No/denies pain     Home Living Family/patient expects to be discharged to:: Private residence   Available Help at Discharge: Family Home Environment: Level entry       Additional Comments: cares for her grandchild 18 yo, and on 5 L O2 at home .    Prior Function Level of Independence: Independent Comments: just retired in Sept and drives and independent with O2    UE/LE Assessment   UE ROM/Strength/Tone/Coordination: [facility]    LE ROM/Strength/Tone/Coordination: [facility]      Communication   Communication Communication: No apparent difficulties     Cognition Overall Cognitive Status: Appears within functional limits for tasks assessed/performed       General Comments General comments (skin integrity, edema, etc.): walkedinto the bathroom and turned quickly with no AD and no LOB , very quick and balance seems good at well with functional tasks    Exercises     Assessment/Plan    PT Problem List         PT Visit Diagnosis Other abnormalities of gait and mobility (R26.89)    No Skilled PT All education completed;Patient is modified independent with all activity/mobility (will have nursing and Mobility continue to walk with pt while here)   Co-evaluation                AMPAC 6 Clicks  Help needed turning from your back to your side while in a flat bed without using bedrails?: None Help needed moving from lying on your back to sitting on the side of a flat bed without using bedrails?: None Help needed moving to and from a bed to a chair (including a wheelchair)?: None Help needed standing up from a chair using your arms (e.g., wheelchair or bedside chair)?: A Little Help needed to walk in hospital room?: A Little Help needed climbing 3-5 steps with a railing? : A Little 6 Click Score: 21      End of Session Equipment Utilized  During Treatment: Gait belt Activity Tolerance: Other (comment) (limited by SOB and dyspnea) Patient left: (P) in bed Nurse Communication: (P) Mobility status PT Visit Diagnosis: Other abnormalities of gait and mobility (R26.89)     Time: 1545-1610 PT Time Calculation (min) (ACUTE ONLY): 25 min  Charges:   PT Evaluation $PT Eval Low Complexity: 1 Low PT Treatments $Gait Training: 8-22 mins    Tomie Elko, PT, MPT Acute Rehabilitation Services Office: [phone] If a weekend: secure chat groups: [facility] PT, [facility] OT, [facility] SLP 06/09/2024   Ahaan Zobrist  06/09/2024, 6:58 PM

## 2024-06-10 DIAGNOSIS — J441 Chronic obstructive pulmonary disease with (acute) exacerbation: Secondary | ICD-10-CM | POA: Diagnosis not present

## 2024-06-10 LAB — COMPREHENSIVE METABOLIC PANEL WITH GFR
ALT: 13 U/L (ref 0–44)
AST: 16 U/L (ref 15–41)
Albumin: 4 g/dL (ref 3.5–5.0)
Alkaline Phosphatase: 57 U/L (ref 38–126)
Anion gap: 5 (ref 5–15)
BUN: 16 mg/dL (ref 8–23)
CO2: 35 mmol/L — ABNORMAL HIGH (ref 22–32)
Calcium: 9.6 mg/dL (ref 8.9–10.3)
Chloride: 101 mmol/L (ref 98–111)
Creatinine, Ser: 0.8 mg/dL (ref 0.44–1.00)
GFR, Estimated: >60 mL/min
Glucose, Bld: 76 mg/dL (ref 70–99)
Potassium: 5 mmol/L (ref 3.5–5.1)
Sodium: 141 mmol/L (ref 135–145)
Total Bilirubin: 0.2 mg/dL (ref 0.0–1.2)
Total Protein: 6.7 g/dL (ref 6.5–8.1)

## 2024-06-10 LAB — CBC WITH DIFFERENTIAL/PLATELET
Abs Immature Granulocytes: 0.06 10*3/uL (ref 0.00–0.07)
Basophils Absolute: 0 10*3/uL (ref 0.0–0.1)
Basophils Relative: 0 %
Eosinophils Absolute: 0 10*3/uL (ref 0.0–0.5)
Eosinophils Relative: 0 %
HCT: 35.4 % — ABNORMAL LOW (ref 36.0–46.0)
Hemoglobin: 11 g/dL — ABNORMAL LOW (ref 12.0–15.0)
Immature Granulocytes: 1 %
Lymphocytes Relative: 9 %
Lymphs Abs: 1 10*3/uL (ref 0.7–4.0)
MCH: 27.8 pg (ref 26.0–34.0)
MCHC: 31.1 g/dL (ref 30.0–36.0)
MCV: 89.4 fL (ref 80.0–100.0)
Monocytes Absolute: 1.2 10*3/uL — ABNORMAL HIGH (ref 0.1–1.0)
Monocytes Relative: 10 %
Neutro Abs: 9.6 10*3/uL — ABNORMAL HIGH (ref 1.7–7.7)
Neutrophils Relative %: 80 %
Platelets: 259 10*3/uL (ref 150–400)
RBC: 3.96 MIL/uL (ref 3.87–5.11)
RDW: 15.6 % — ABNORMAL HIGH (ref 11.5–15.5)
WBC: 11.9 10*3/uL — ABNORMAL HIGH (ref 4.0–10.5)
nRBC: 0 % (ref 0.0–0.2)

## 2024-06-10 LAB — FERRITIN: Ferritin: 37 ng/mL (ref 11–307)

## 2024-06-10 LAB — IRON AND TIBC
Iron: 56 ug/dL (ref 28–170)
Saturation Ratios: 14 % (ref 10.4–31.8)
TIBC: 392 ug/dL (ref 250–450)
UIBC: 336 ug/dL

## 2024-06-10 LAB — PHOSPHORUS: Phosphorus: 3.7 mg/dL (ref 2.5–4.6)

## 2024-06-10 LAB — MAGNESIUM: Magnesium: 2.6 mg/dL — ABNORMAL HIGH (ref 1.7–2.4)

## 2024-06-10 MED ORDER — PREDNISONE 20 MG PO TABS: 20.0000 mg | ORAL_TABLET | Freq: Every day | ORAL | Status: DC

## 2024-06-10 MED ORDER — PREDNISONE 5 MG PO TABS: 30.0000 mg | ORAL_TABLET | Freq: Every day | ORAL | Status: DC

## 2024-06-10 MED ORDER — PREDNISONE 5 MG PO TABS: 10.0000 mg | ORAL_TABLET | Freq: Every day | ORAL | Status: DC

## 2024-06-10 MED ORDER — PREDNISONE 20 MG PO TABS
40.0000 mg | ORAL_TABLET | Freq: Every day | ORAL | Status: DC
  Administered 2024-06-11: 40 mg via ORAL
  Filled 2024-06-10: qty 2
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
## 2024-06-10 NOTE — TOC Progression Note (Addendum)
 Transition of Care [facility]) - Progression Note    Patient Details  Name: [name] MRN: [id number] Date of Birth: [birth year]  Transition of Care [facility]) CM/SW Contact  Isma Tietje, Nathanel, RN Phone Number: 06/10/2024, 3:49 PM  Clinical Narrative: Patient already active w/[employer]-need travel tank for home 02 delivery to rm-left info on recording-await call back from [employer]. Patient for taxi voucher await supv for approval.-patient states no money, no one to pick her up tomorrow.  -4:20p-Supv approved taxi voucher-will provide in am;still awaiting call back from [employer] for delivery of travel tank to rm prior d/c.     Expected Discharge Plan: Home/Self Care Barriers to Discharge: Continued Medical Work up               Expected Discharge Plan and Services   Discharge Planning Services: CM Consult   Living arrangements for the past 2 months: Single Family Home                   DME Agency: [employer]                   Social Drivers of Health (SDOH) Interventions SDOH Screenings   Food Insecurity: No Food Insecurity (06/09/2024)  Housing: Low Risk (06/09/2024)  Recent Concern: Housing - High Risk (04/21/2024)  Transportation Needs: No Transportation Needs (06/09/2024)  Utilities: Not At Risk (06/09/2024)  Recent Concern: Utilities - At Risk (04/21/2024)  Social Connections: Moderately Integrated (06/09/2024)  Tobacco Use: Medium Risk (06/08/2024)    Readmission Risk Interventions    04/09/2024    9:54 AM 12/13/2022   11:05 AM 05/23/2022   10:12 AM  Readmission Risk Prevention Plan  Transportation Screening Complete Complete Complete  PCP or Specialist Appt within 5-7 Days  Complete   PCP or Specialist Appt within 3-5 Days Complete  Complete  Home Care Screening  Complete   Medication Review (RN CM)  Complete   HRI or Home Care Consult Complete  Complete  Social Work Consult for Recovery Care Planning/Counseling Complete  Complete  Palliative Care Screening Not  Applicable  Not Applicable  Medication Review Oceanographer) Complete  Complete

## 2024-06-10 NOTE — Progress Notes (Signed)
 " PROGRESS NOTE    [name]  FMW:[id number] DOB: 03-Jul-1960 DOA: 06/08/2024 PCP: Rosalea Rosina [name], PA  Chief Complaint  Patient presents with   Shortness of Breath    Brief Narrative:   [name] is [name] 64 y.o. female with medical history significant for prediabetes, HTN, HLD, obesity, OSA, CAD, cardiomyopathy, severe COPD on NIV at night, chronic hypoxic respiratory failure on 5 L [place], hiatal hernia, and GERD who presented to the ED for evaluation of shortness of breath and admitted for COPD exacerbation.   Assessment & Plan:   Principal Problem:   COPD with acute exacerbation (HCC) Active Problems:   Nocturnal dyspnea   # Severe COPD with acute exacerbation # Acute on chronic hypoxic and hypercarbic respiratory failure - Patient with history of recurrent hospitalizations for COPD exacerbation presented with acute onset of shortness of breath, increased cough and wheezing.  On 5 L at baseline (though occasionally wears no oxygen ). - she's improved subjectively, but continued diffuse wheezing on exam - CXR with no evidence of PNA; Neg covid, RSV and flu test - IV Solu-Medrol  80 mg daily for now, will transition to PO steroids tmrw - azithromycin  500 mg x 3 doses - Brovana , yulperi and Pulmicort  - As needed duonebs - IS, flutter valve   # Hypokalemia -resolved  # CAD # HLD - Continue Repatha  every 2 weeks in the outpatient   # Nocturnal dyspnea # Hypercarbic respiratory failure - BiPAP at bedtime   # GERD - Continue PPI   # Insomnia - Continue trazodone  at bedtime    DVT prophylaxis: lovenox  Code Status: full Family Communication: none Disposition:   Status is: Inpatient Remains inpatient appropriate because: need for continued inpatient care   Consultants:  none  Procedures:  none  Antimicrobials:  Anti-infectives (From admission, onward)    Start     Dose/Rate Route Frequency Ordered Stop   06/09/24 1000  azithromycin  (ZITHROMAX ) tablet  500 mg        500 mg Oral Daily 06/09/24 0121 06/12/24 0959       Subjective:  Feels about the same today, 70%  Objective: Vitals:   06/10/24 0424 06/10/24 0618 06/10/24 1223 06/10/24 1400  BP:  (!) 105/92    Pulse:  70    Resp:  20    Temp: (!) 97.5 F (36.4 C) 97.9 F (36.6 C)  (!) 97.3 F (36.3 C)  TempSrc: Oral     SpO2:  98% 98%   Weight:      Height:        Intake/Output Summary (Last 24 hours) at 06/10/2024 1442 Last data filed at 06/09/2024 2100 Gross per 24 hour  Intake --  Output 1000 ml  Net -1000 ml   Filed Weights   06/08/24 1819  Weight: 68.5 kg    Examination:  General: No acute distress. Cardiovascular: RRR Lungs: continued diffuse expiratory wheezing Neurological: Alert and oriented 3. Moves all extremities 4 with equal strength. Cranial nerves II through XII grossly intact. Extremities: No clubbing or cyanosis. No edema.   Data Reviewed: I have personally reviewed following labs and imaging studies  CBC: Recent Labs  Lab 06/08/24 1906 06/09/24 0534 06/10/24 0432  WBC 6.2 6.3 11.9*  NEUTROABS 5.7  --  9.6*  HGB 10.5* 10.4* 11.0*  HCT 34.2* 33.8* 35.4*  MCV 90.0 89.9 89.4  PLT 245 243 259    Basic Metabolic Panel: Recent Labs  Lab 06/08/24 2029 06/09/24 0534 06/10/24 0432  NA 142 139  141  K 3.4* 4.7 5.0  CL 110 102 101  CO2 23 30 35*  GLUCOSE 126* 129* 76  BUN 6* 8 16  CREATININE 0.36* 0.59 0.80  CALCIUM 6.9* 9.3 9.6  MG  --  2.6* 2.6*  PHOS  --   --  3.7    GFR: Estimated Creatinine Clearance: 63.6 mL/min (by C-G formula based on SCr of 0.8 mg/dL).  Liver Function Tests: Recent Labs  Lab 06/08/24 2029 06/10/24 0432  AST 18 16  ALT 13 13  ALKPHOS 47 57  BILITOT <0.2 0.2  PROT 5.3* 6.7  ALBUMIN 3.3* 4.0    CBG: No results for input(s): GLUCAP in the last 168 hours.   Recent Results (from the past 240 hours)  Resp panel by RT-PCR (RSV, Flu Rudy Domek&B, Covid) Anterior Nasal Swab     Status: None   Collection  Time: 06/08/24  6:27 PM   Specimen: Anterior Nasal Swab  Result Value Ref Range Status   SARS Coronavirus 2 by RT PCR NEGATIVE NEGATIVE Final    Comment: (NOTE) SARS-CoV-2 target nucleic acids are NOT DETECTED.  The SARS-CoV-2 RNA is generally detectable in upper respiratory specimens during the acute phase of infection. The lowest concentration of SARS-CoV-2 viral copies this assay can detect is 138 copies/mL. Constanza Mincy negative result does not preclude SARS-Cov-2 infection and should not be used as the sole basis for treatment or other patient management decisions. Lileigh Fahringer negative result may occur with  improper specimen collection/handling, submission of specimen other than nasopharyngeal swab, presence of viral mutation(s) within the areas targeted by this assay, and inadequate number of viral copies(<138 copies/mL). Deniah Saia negative result must be combined with clinical observations, patient history, and epidemiological information. The expected result is Negative.  Fact Sheet for Patients:  bloggercourse.com  Fact Sheet for Healthcare Providers:  seriousbroker.it  This test is no t yet approved or cleared by the [place]  FDA and  has been authorized for detection and/or diagnosis of SARS-CoV-2 by FDA under an Emergency Use Authorization (EUA). This EUA will remain  in effect (meaning this test can be used) for the duration of the COVID-19 declaration under Section 564(b)(1) of the Act, 21 U.S.C.section 360bbb-3(b)(1), unless the authorization is terminated  or revoked sooner.       Influenza Avi Kerschner by PCR NEGATIVE NEGATIVE Final   Influenza B by PCR NEGATIVE NEGATIVE Final    Comment: (NOTE) The Xpert Xpress SARS-CoV-2/FLU/RSV plus assay is intended as an aid in the diagnosis of influenza from Nasopharyngeal swab specimens and should not be used as Elijiah Mickley sole basis for treatment. Nasal washings and aspirates are unacceptable for Xpert Xpress  SARS-CoV-2/FLU/RSV testing.  Fact Sheet for Patients: bloggercourse.com  Fact Sheet for Healthcare Providers: seriousbroker.it  This test is not yet approved or cleared by the [place]  FDA and has been authorized for detection and/or diagnosis of SARS-CoV-2 by FDA under an Emergency Use Authorization (EUA). This EUA will remain in effect (meaning this test can be used) for the duration of the COVID-19 declaration under Section 564(b)(1) of the Act, 21 U.S.C. section 360bbb-3(b)(1), unless the authorization is terminated or revoked.     Resp Syncytial Virus by PCR NEGATIVE NEGATIVE Final    Comment: (NOTE) Fact Sheet for Patients: bloggercourse.com  Fact Sheet for Healthcare Providers: seriousbroker.it  This test is not yet approved or cleared by the [place]  FDA and has been authorized for detection and/or diagnosis of SARS-CoV-2 by FDA under an Emergency Use Authorization (EUA). This  EUA will remain in effect (meaning this test can be used) for the duration of the COVID-19 declaration under Section 564(b)(1) of the Act, 21 U.S.C. section 360bbb-3(b)(1), unless the authorization is terminated or revoked.  Performed at [facility], [address], [place], [place] [address]   Respiratory (~20 pathogens) panel by PCR     Status: None   Collection Time: 06/08/24  6:27 PM   Specimen: Nasopharyngeal Swab; Respiratory  Result Value Ref Range Status   Adenovirus NOT DETECTED NOT DETECTED Final   Coronavirus 229E NOT DETECTED NOT DETECTED Final    Comment: (NOTE) The Coronavirus on the Respiratory Panel, DOES NOT test for the novel  Coronavirus (2019 nCoV)    Coronavirus HKU1 NOT DETECTED NOT DETECTED Final   Coronavirus NL63 NOT DETECTED NOT DETECTED Final   Coronavirus OC43 NOT DETECTED NOT DETECTED Final   Metapneumovirus NOT DETECTED NOT  DETECTED Final   Rhinovirus / Enterovirus NOT DETECTED NOT DETECTED Final   Influenza Orie Cuttino NOT DETECTED NOT DETECTED Final   Influenza B NOT DETECTED NOT DETECTED Final   Parainfluenza Virus 1 NOT DETECTED NOT DETECTED Final   Parainfluenza Virus 2 NOT DETECTED NOT DETECTED Final   Parainfluenza Virus 3 NOT DETECTED NOT DETECTED Final   Parainfluenza Virus 4 NOT DETECTED NOT DETECTED Final   Respiratory Syncytial Virus NOT DETECTED NOT DETECTED Final   Bordetella pertussis NOT DETECTED NOT DETECTED Final   Bordetella Parapertussis NOT DETECTED NOT DETECTED Final   Chlamydophila pneumoniae NOT DETECTED NOT DETECTED Final   Mycoplasma pneumoniae NOT DETECTED NOT DETECTED Final    Comment: Performed at [facility], [address], [place], [place] [address]         Radiology Studies: DG Chest Portable 1 View Result Date: 06/08/2024 EXAM: 1 VIEW(S) XRAY OF THE CHEST 06/08/2024 06:51:00 PM COMPARISON: 04/20/2024 CLINICAL HISTORY: Shortness of breath. FINDINGS: LUNGS AND PLEURA: Stable linear scarring of right mid lung zone. No pleural effusion. No pneumothorax. HEART AND MEDIASTINUM: No acute abnormality of the cardiac and mediastinal silhouettes. BONES AND SOFT TISSUES: No acute osseous abnormality. IMPRESSION: 1. No acute findings. 2. Stable linear scarring of the right mid lung zone. Electronically signed by: Oneil Devonshire MD 06/08/2024 06:55 PM EST RP Workstation: [id number]        Scheduled Meds:  azithromycin   500 mg Oral Daily   budesonide -glycopyrrolate -formoterol   2 puff Inhalation BID   enoxaparin  (LOVENOX ) injection  40 mg Subcutaneous Q24H   [START ON 06/11/2024] ferrous sulfate   325 mg Oral QODAY   ipratropium-albuterol   3 mL Nebulization Q4H   lidocaine   1 patch Transdermal Q24H   methylPREDNISolone  (SOLU-MEDROL ) injection  80 mg Intravenous Daily   pantoprazole   40 mg Oral Daily   traZODone   50 mg Oral QHS   Continuous Infusions:   LOS: 2 days    Time spent:  over 30 min    Meliton Monte, MD [facility]   To contact the attending provider between 7A-7P or the covering provider during after hours 7P-7A, please log into the web site [email or web address] and access using universal [place] password for that web site. If you do not have the password, please call the hospital operator.  06/10/2024, 2:42 PM    "

## 2024-06-10 NOTE — Plan of Care (Signed)

## 2024-06-11 ENCOUNTER — Other Ambulatory Visit (HOSPITAL_COMMUNITY): Payer: Self-pay

## 2024-06-11 LAB — CBC
HCT: 34 % — ABNORMAL LOW (ref 36.0–46.0)
Hemoglobin: 10.6 g/dL — ABNORMAL LOW (ref 12.0–15.0)
MCH: 27.7 pg (ref 26.0–34.0)
MCHC: 31.2 g/dL (ref 30.0–36.0)
MCV: 89 fL (ref 80.0–100.0)
Platelets: 245 10*3/uL (ref 150–400)
RBC: 3.82 MIL/uL — ABNORMAL LOW (ref 3.87–5.11)
RDW: 15.6 % — ABNORMAL HIGH (ref 11.5–15.5)
WBC: 9.7 10*3/uL (ref 4.0–10.5)
nRBC: 0 % (ref 0.0–0.2)

## 2024-06-11 LAB — MISC LABCORP TEST (SEND OUT)
LabCorp test name: 83935
Labcorp test code: 83935

## 2024-06-11 LAB — BASIC METABOLIC PANEL WITH GFR
Anion gap: 6 (ref 5–15)
BUN: 12 mg/dL (ref 8–23)
CO2: 34 mmol/L — ABNORMAL HIGH (ref 22–32)
Calcium: 9.5 mg/dL (ref 8.9–10.3)
Chloride: 98 mmol/L (ref 98–111)
Creatinine, Ser: 0.57 mg/dL (ref 0.44–1.00)
GFR, Estimated: >60 mL/min
Glucose, Bld: 81 mg/dL (ref 70–99)
Potassium: 4.2 mmol/L (ref 3.5–5.1)
Sodium: 138 mmol/L (ref 135–145)

## 2024-06-11 LAB — PHOSPHORUS: Phosphorus: 3.5 mg/dL (ref 2.5–4.6)

## 2024-06-11 LAB — MAGNESIUM: Magnesium: 2.4 mg/dL (ref 1.7–2.4)

## 2024-06-11 MED ORDER — PREDNISONE 20 MG PO TABS
ORAL_TABLET | ORAL | 0 refills | Status: AC
  Filled 2024-06-11: qty 14, 8d supply, fill #0

## 2024-06-11 MED ORDER — SULFAMETHOXAZOLE-TRIMETHOPRIM 800-160 MG PO TABS
1.0000 | ORAL_TABLET | ORAL | 0 refills | Status: AC
  Filled 2024-06-11: qty 12, 28d supply, fill #0

## 2024-06-11 NOTE — Progress Notes (Signed)
 Discharge med in  a secure bag delivered to patient by this RN

## 2024-06-11 NOTE — TOC Transition Note (Signed)
 Transition of Care [facility]) - Discharge Note   Patient Details  Name: [name] MRN: [id number] Date of Birth: 1960-10-20  [facility]) CM/SW Contact:  Bascom Service, RN Phone Number: 06/11/2024, 12:03 PM   Clinical Narrative: ROYCE [employer] rep Jerel to bring home 02 travel tank to hospital prior d/c.[employer] [place] taxi voucher given to nurse to call when ready. No further CM needs.      Final next level of care: Home/Self Care Barriers to Discharge: No Barriers Identified   Patient Goals and CMS Choice Patient states their goals for this hospitalization and ongoing recovery are:: Home CMS Medicare.gov Compare Post Acute Care list provided to:: Patient Choice offered to / list presented to : Patient [place] ownership interest in [facility].provided to:: Patient    Discharge Placement                       Discharge Plan and Services Additional resources added to the After Visit Summary for     Discharge Planning Services: CM Consult              DME Agency: [employer]                  Social Drivers of Health (SDOH) Interventions SDOH Screenings   Food Insecurity: No Food Insecurity (06/09/2024)  Housing: Low Risk (06/09/2024)  Recent Concern: Housing - High Risk (04/21/2024)  Transportation Needs: No Transportation Needs (06/09/2024)  Utilities: Not At Risk (06/09/2024)  Recent Concern: Utilities - At Risk (04/21/2024)  Social Connections: Moderately Integrated (06/09/2024)  Tobacco Use: Medium Risk (06/08/2024)     Readmission Risk Interventions    04/09/2024    9:54 AM 12/13/2022   11:05 AM 05/23/2022   10:12 AM  Readmission Risk Prevention Plan  Transportation Screening Complete Complete Complete  PCP or Specialist Appt within 5-7 Days  Complete   PCP or Specialist Appt within 3-5 Days Complete  Complete  Home Care Screening  Complete   Medication Review (RN CM)  Complete   HRI or Home Care Consult Complete  Complete  Social Work  Consult for Recovery Care Planning/Counseling Complete  Complete  Palliative Care Screening Not Applicable  Not Applicable  Medication Review Oceanographer) Complete  Complete

## 2024-06-11 NOTE — Plan of Care (Signed)
   Problem: Clinical Measurements: Goal: Will remain free from infection Outcome: Progressing Goal: Diagnostic test results will improve Outcome: Progressing   Problem: Safety: Goal: Ability to remain free from injury will improve Outcome: Progressing

## 2024-06-11 NOTE — Discharge Summary (Addendum)
 Physician Discharge Summary  [name] FMW:[id number] DOB: 02-20-1961 DOA: 06/08/2024  PCP: Rosalea Rosina [name], PA  Admit date: 06/08/2024 Discharge date: 06/11/2024  Time spent: 40 minutes  Recommendations for Outpatient Follow-up:  Follow outpatient CBC/CMP  Follow with pulmonary outpatient for her severe COPD Note chronic daily prednisone  - discharge with bactrim  three times weekly for PCP prophylaxis after discussion with pulm  Discharge Diagnoses:  Principal Problem:   COPD with acute exacerbation (HCC) Active Problems:   Nocturnal dyspnea   Discharge Condition: stable  Diet recommendation: heart healthy  Filed Weights   06/08/24 1819  Weight: 68.5 kg    History of present illness:   [name] is [name] 63 y.o. female with medical history significant for prediabetes, HTN, HLD, obesity, OSA, CAD, cardiomyopathy, severe COPD on NIV at night, chronic hypoxic respiratory failure on 5 L , hiatal hernia, and GERD who presented to the ED for evaluation of shortness of breath and admitted for COPD exacerbation.   She's improved with supportive care, steroids, nebs, abx.  Stable for discharge on 1/27.  See below for additional details.  Hospital Course:  Assessment and Plan:  # Severe COPD with acute exacerbation # Acute on chronic hypoxic and hypercarbic respiratory failure - Patient with history of recurrent hospitalizations for COPD exacerbation presented with acute onset of shortness of breath, increased cough and wheezing.  On 5 L at baseline. - Dr. Theophilus started chronic daily prednisone  20 mg 12/16  - also receives nucala  - CXR with no evidence of PNA; Neg covid, RSV and flu test - negative RVP - will transition to PO steroids, taper and resume chronic daily steroid.  Bactrim  started for PCP prophylaxis. - azithromycin  500 mg x 3 doses - resume home breztri .  Prn nebs.   # Hypokalemia -resolved   # CAD # HLD - Continue Repatha  every 2 weeks in the  outpatient   # Nocturnal dyspnea # Hypercarbic respiratory failure - BiPAP at bedtime - she notes having what I presume is trilogy vent for [name] few weeks   # GERD - Continue PPI   # Insomnia - Continue trazodone  at bedtime     Procedures: none   Consultations: none  Discharge Exam: Vitals:   06/11/24 0833 06/11/24 0834  BP:    Pulse:    Resp:    Temp:    SpO2: 98% 97%   Feels 85-90% today Eager to go home if possible  General: No acute distress. Cardiovascular: RRR Lungs: better air movement today, continued expiratory wheezes, but overall improved - satting well on 5 L Neurological: Alert and oriented 3. Moves all extremities 4 with equal strength. Cranial nerves II through XII grossly intact. Skin: Warm and dry. No rashes or lesions. Extremities: No clubbing or cyanosis. No edema.   Discharge Instructions   Discharge Instructions     Call MD for:  difficulty breathing, headache or visual disturbances   Complete by: As directed    Call MD for:  extreme fatigue   Complete by: As directed    Call MD for:  hives   Complete by: As directed    Call MD for:  persistant dizziness or light-headedness   Complete by: As directed    Call MD for:  persistant nausea and vomiting   Complete by: As directed    Call MD for:  redness, tenderness, or signs of infection (pain, swelling, redness, odor or green/yellow discharge around incision site)   Complete by: As directed    Call  MD for:  severe uncontrolled pain   Complete by: As directed    Call MD for:  temperature >100.4   Complete by: As directed    Discharge instructions   Complete by: As directed    You were seen for [name] COPD exacerbation.  You've improved with steroids, antibiotics, and breathing treatments.   We'll send you home with [name] steroid taper.  Continue your inhalers and nebulizer treatments as prescribed.  Resume your chronic daily prednisone  after you complete your taper.   Continue the nightly use of  the trilogy ventilator at night.   Please follow up with Dr. Theophilus as an outpatient.  We started you on prophylactic bactrim  to prevent infections while you're on steroids.   Return for new, recurrent, or worsening symptoms.  Please ask your PCP to request records from this hospitalization so they know what was done and what the next steps will be.   Increase activity slowly   Complete by: As directed       Allergies as of 06/11/2024       Reactions   Acetaminophen  Shortness Of Breath   Amoxicillin  Shortness Of Breath   Ezetimibe  Other (See Comments)   Worsened the patient's COPD   Ibuprofen  Shortness Of Breath   Statins Other (See Comments)   Worsened the patient's COPD        Medication List     STOP taking these medications    azithromycin  250 MG tablet Commonly known as: ZITHROMAX    methylPREDNISolone  4 MG Tbpk tablet Commonly known as: MEDROL  DOSEPAK       TAKE these medications    albuterol  (2.5 MG/3ML) 0.083% nebulizer solution Commonly known as: PROVENTIL  INHALE 3 ML BY NEBULIZATION EVERY 6 HOURS AS NEEDED FOR WHEEZING OR SHORTNESS OF BREATH   albuterol  108 (90 Base) MCG/ACT inhaler Commonly known as: VENTOLIN  HFA Inhale 2 puffs into the lungs every 6 (six) hours as needed for wheezing or shortness of breath.   Breztri  Aerosphere 160-9-4.8 MCG/ACT Aero inhaler Generic drug: budesonide -glycopyrrolate -formoterol  Inhale 2 puffs into the lungs 2 (two) times daily.   ferrous sulfate  325 (65 FE) MG tablet Take 1 tablet (325 mg total) by mouth daily with breakfast.   ipratropium-albuterol  0.5-2.5 (3) MG/3ML Soln Commonly known as: DUONEB Take 3 mLs by nebulization every 4 (four) hours as needed.   nitroGLYCERIN  0.4 MG SL tablet Commonly known as: NITROSTAT  Place 1 tablet (0.4 mg total) under the tongue every 5 (five) minutes as needed.   Nucala  100 MG/ML Soaj Generic drug: Mepolizumab  Inject 1 mL (100 mg total) into the skin every 28 (twenty-eight)  days.   omeprazole  20 MG capsule Commonly known as: PRILOSEC TAKE 1 CAPSULE BY MOUTH EVERY DAY   OXYGEN  Inhale 5 L/min into the lungs daily as needed (for shortness of breath).   predniSONE  20 MG tablet Commonly known as: DELTASONE  Take 20 mg by mouth daily with breakfast. What changed: Another medication with the same name was added. Make sure you understand how and when to take each.   predniSONE  20 MG tablet Commonly known as: DELTASONE  Take 2 tablets by mouth daily with breakfast for 4 days, THEN 1.5 tablets daily with breakfast for 4 days. Then resume your chronic steroid (prednisone  20 mg daily). Start taking on: June 12, 2024 What changed: You were already taking [name] medication with the same name, and this prescription was added. Make sure you understand how and when to take each.   propranolol 10 MG tablet Commonly known  as: INDERAL Take 10 mg by mouth 3 (three) times daily as needed.   Repatha  SureClick 140 MG/ML Soaj Generic drug: Evolocumab  Inject 140 mg into the skin every 14 (fourteen) days.   sulfamethoxazole -trimethoprim  800-160 MG tablet Commonly known as: Bactrim  DS Take 1 tablet by mouth 3 (three) times [name] week. Follow up with Dr. Theophilus outpatient for refills Start taking on: June 12, 2024 What changed: additional instructions   traZODone  50 MG tablet Commonly known as: DESYREL  Take 50 mg by mouth at bedtime.   Vitamin D (Ergocalciferol) 1.25 MG (50000 UNIT) Caps capsule Commonly known as: DRISDOL 1 cap(s) orally once Floy Riegler week; Duration: 30 days       Allergies[1]    The results of significant diagnostics from this hospitalization (including imaging, microbiology, ancillary and laboratory) are listed below for reference.    Significant Diagnostic Studies: DG Chest Portable 1 View Result Date: 06/08/2024 EXAM: 1 VIEW(S) XRAY OF THE CHEST 06/08/2024 06:51:00 PM COMPARISON: 04/20/2024 CLINICAL HISTORY: Shortness of breath. FINDINGS: LUNGS AND  PLEURA: Stable linear scarring of right mid lung zone. No pleural effusion. No pneumothorax. HEART AND MEDIASTINUM: No acute abnormality of the cardiac and mediastinal silhouettes. BONES AND SOFT TISSUES: No acute osseous abnormality. IMPRESSION: 1. No acute findings. 2. Stable linear scarring of the right mid lung zone. Electronically signed by: Oneil Devonshire MD 06/08/2024 06:55 PM EST RP Workstation: [id number]    Microbiology: Recent Results (from the past 240 hours)  Resp panel by RT-PCR (RSV, Flu Karl Knarr&B, Covid) Anterior Nasal Swab     Status: None   Collection Time: 06/08/24  6:27 PM   Specimen: Anterior Nasal Swab  Result Value Ref Range Status   SARS Coronavirus 2 by RT PCR NEGATIVE NEGATIVE Final    Comment: (NOTE) SARS-CoV-2 target nucleic acids are NOT DETECTED.  The SARS-CoV-2 RNA is generally detectable in upper respiratory specimens during the acute phase of infection. The lowest concentration of SARS-CoV-2 viral copies this assay can detect is 138 copies/mL. Charlii Yost negative result does not preclude SARS-Cov-2 infection and should not be used as the sole basis for treatment or other patient management decisions. Kanetra Ho negative result may occur with  improper specimen collection/handling, submission of specimen other than nasopharyngeal swab, presence of viral mutation(s) within the areas targeted by this assay, and inadequate number of viral copies(<138 copies/mL). Harvest Stanco negative result must be combined with clinical observations, patient history, and epidemiological information. The expected result is Negative.  Fact Sheet for Patients:  bloggercourse.com  Fact Sheet for Healthcare Providers:  seriousbroker.it  This test is no t yet approved or cleared by the [place]  FDA and  has been authorized for detection and/or diagnosis of SARS-CoV-2 by FDA under an Emergency Use Authorization (EUA). This EUA will remain  in effect (meaning  this test can be used) for the duration of the COVID-19 declaration under Section 564(b)(1) of the Act, 21 U.S.C.section 360bbb-3(b)(1), unless the authorization is terminated  or revoked sooner.       Influenza Cruzita Lipa by PCR NEGATIVE NEGATIVE Final   Influenza B by PCR NEGATIVE NEGATIVE Final    Comment: (NOTE) The Xpert Xpress SARS-CoV-2/FLU/RSV plus assay is intended as an aid in the diagnosis of influenza from Nasopharyngeal swab specimens and should not be used as Allayna Erlich sole basis for treatment. Nasal washings and aspirates are unacceptable for Xpert Xpress SARS-CoV-2/FLU/RSV testing.  Fact Sheet for Patients: bloggercourse.com  Fact Sheet for Healthcare Providers: seriousbroker.it  This test is not yet approved or cleared by  the [place]  FDA and has been authorized for detection and/or diagnosis of SARS-CoV-2 by FDA under an Emergency Use Authorization (EUA). This EUA will remain in effect (meaning this test can be used) for the duration of the COVID-19 declaration under Section 564(b)(1) of the Act, 21 U.S.C. section 360bbb-3(b)(1), unless the authorization is terminated or revoked.     Resp Syncytial Virus by PCR NEGATIVE NEGATIVE Final    Comment: (NOTE) Fact Sheet for Patients: bloggercourse.com  Fact Sheet for Healthcare Providers: seriousbroker.it  This test is not yet approved or cleared by the [place]  FDA and has been authorized for detection and/or diagnosis of SARS-CoV-2 by FDA under an Emergency Use Authorization (EUA). This EUA will remain in effect (meaning this test can be used) for the duration of the COVID-19 declaration under Section 564(b)(1) of the Act, 21 U.S.C. section 360bbb-3(b)(1), unless the authorization is terminated or revoked.  Performed at [facility], [address], [place], [place] [address]   Respiratory (~20  pathogens) panel by PCR     Status: None   Collection Time: 06/08/24  6:27 PM   Specimen: Nasopharyngeal Swab; Respiratory  Result Value Ref Range Status   Adenovirus NOT DETECTED NOT DETECTED Final   Coronavirus 229E NOT DETECTED NOT DETECTED Final    Comment: (NOTE) The Coronavirus on the Respiratory Panel, DOES NOT test for the novel  Coronavirus (2019 nCoV)    Coronavirus HKU1 NOT DETECTED NOT DETECTED Final   Coronavirus NL63 NOT DETECTED NOT DETECTED Final   Coronavirus OC43 NOT DETECTED NOT DETECTED Final   Metapneumovirus NOT DETECTED NOT DETECTED Final   Rhinovirus / Enterovirus NOT DETECTED NOT DETECTED Final   Influenza [place] Klas NOT DETECTED NOT DETECTED Final   Influenza B NOT DETECTED NOT DETECTED Final   Parainfluenza Virus 1 NOT DETECTED NOT DETECTED Final   Parainfluenza Virus 2 NOT DETECTED NOT DETECTED Final   Parainfluenza Virus 3 NOT DETECTED NOT DETECTED Final   Parainfluenza Virus 4 NOT DETECTED NOT DETECTED Final   Respiratory Syncytial Virus NOT DETECTED NOT DETECTED Final   Bordetella pertussis NOT DETECTED NOT DETECTED Final   Bordetella Parapertussis NOT DETECTED NOT DETECTED Final   Chlamydophila pneumoniae NOT DETECTED NOT DETECTED Final   Mycoplasma pneumoniae NOT DETECTED NOT DETECTED Final    Comment: Performed at [facility], [address] [place], [place] [address]     Labs: Basic Metabolic Panel: Recent Labs  Lab 06/08/24 2029 06/09/24 0534 06/10/24 0432 06/11/24 0524  NA 142 139 141 138  K 3.4* 4.7 5.0 4.2  CL 110 102 101 98  CO2 23 30 35* 34*  GLUCOSE 126* 129* 76 81  BUN 6* 8 16 12   CREATININE 0.36* 0.59 0.80 0.57  CALCIUM 6.9* 9.3 9.6 9.5  MG  --  2.6* 2.6* 2.4  PHOS  --   --  3.7 3.5   Liver Function Tests: Recent Labs  Lab 06/08/24 2029 06/10/24 0432  AST 18 16  ALT 13 13  ALKPHOS 47 57  BILITOT <0.2 0.2  PROT 5.3* 6.7  ALBUMIN 3.3* 4.0   No results for input(s): LIPASE, AMYLASE in the last 168 hours. No  results for input(s): AMMONIA in the last 168 hours. CBC: Recent Labs  Lab 06/08/24 1906 06/09/24 0534 06/10/24 0432 06/11/24 0524  WBC 6.2 6.3 11.9* 9.7  NEUTROABS 5.7  --  9.6*  --   HGB 10.5* 10.4* 11.0* 10.6*  HCT 34.2* 33.8* 35.4* 34.0*  MCV 90.0  89.9 89.4 89.0  PLT 245 243 259 245   Cardiac Enzymes: No results for input(s): CKTOTAL, CKMB, CKMBINDEX, TROPONINI in the last 168 hours. BNP: BNP (last 3 results) Recent Labs    09/29/23 1933 10/11/23 1801 04/20/24 2301  BNP 58.2 24.1 12.6    ProBNP (last 3 results) Recent Labs    03/25/24 1721 04/08/24 1805 06/08/24 2029  PROBNP <50.0 <50.0 <50.0    CBG: No results for input(s): GLUCAP in the last 168 hours.     Signed:  Meliton Monte MD.  [facility] 06/11/2024, 11:03 AM       [1]  Allergies Allergen Reactions   Acetaminophen  Shortness Of Breath   Amoxicillin  Shortness Of Breath   Ezetimibe  Other (See Comments)    Worsened the patient's COPD   Ibuprofen  Shortness Of Breath   Statins Other (See Comments)    Worsened the patient's COPD

## 2024-06-12 ENCOUNTER — Telehealth (HOSPITAL_COMMUNITY): Payer: Self-pay

## 2024-06-12 NOTE — Telephone Encounter (Signed)
 F/u call regarding pulmonary rehab. Patient confirmed her [employer] benefits ended on 05/15/2024. Informed her of the $15 copay for [facility] Medicare, patient states she cannot afford it and will call back if she wants to schedule.  Closing referral.

## 2024-06-14 NOTE — Telephone Encounter (Signed)
 Rx not on patient's note to continue. Okay to refill? Please advice, thanks

## 2024-06-18 ENCOUNTER — Other Ambulatory Visit: Payer: Self-pay

## 2024-06-19 ENCOUNTER — Telehealth: Payer: Self-pay

## 2024-06-19 MED ORDER — BREZTRI AEROSPHERE 160-9-4.8 MCG/ACT IN AERO: 2.0000 | INHALATION_SPRAY | Freq: Two times a day (BID) | RESPIRATORY_TRACT | 3 refills | Status: AC

## 2024-06-19 NOTE — Telephone Encounter (Signed)
 Breztri  rx refilled.

## 2024-06-19 NOTE — Telephone Encounter (Signed)
 Not sure I understand the triage message.  Is the patient asking for a refill on Breztri ? If that is the case then it is okay to send a refill for Breztri 

## 2024-06-19 NOTE — Telephone Encounter (Signed)
 Copied from CRM [id number]. Topic: Clinical - Medication Refill >> Jun 19, 2024  1:26 PM Ismael A wrote: Medication: budesonide -glycopyrrolate -formoterol  (BREZTRI  AEROSPHERE) 160-9-4.8 MCG/ACT AERO inhaler  Has the patient contacted their pharmacy? No (Agent: If no, request that the patient contact the pharmacy for the refill. If patient does not wish to contact the pharmacy document the reason why and proceed with request.) (Agent: If yes, when and what did the pharmacy advise?)  This is the patient's preferred pharmacy:  [id number] [address] [place], [place] - [address] [place] [address] Phone: [phone] Fax: [phone]  Is this the correct pharmacy for this prescription? Yes If no, delete pharmacy and type the correct one.   Has the prescription been filled recently? No  Is the patient out of the medication? Yes  Has the patient been seen for an appointment in the last year OR does the patient have an upcoming appointment? Yes  Can we respond through MyChart? No  Agent: Please be advised that Rx refills may take up to 3 business days. We ask that you follow-up with your pharmacy.

## 2024-07-01 ENCOUNTER — Ambulatory Visit: Admitting: Primary Care

## 2024-07-16 ENCOUNTER — Ambulatory Visit: Admitting: Cardiology

## 2024-08-19 ENCOUNTER — Encounter (INDEPENDENT_AMBULATORY_CARE_PROVIDER_SITE_OTHER): Admitting: Ophthalmology
